# Patient Record
Sex: Female | Born: 1950
Health system: Southern US, Community
[De-identification: ages and names within clinical notes are randomized; demographics above are authoritative.]

## PROBLEM LIST (undated history)

## (undated) DIAGNOSIS — R112 Nausea with vomiting, unspecified: Secondary | ICD-10-CM

## (undated) DIAGNOSIS — K219 Gastro-esophageal reflux disease without esophagitis: Secondary | ICD-10-CM

## (undated) DIAGNOSIS — Z87898 Personal history of other specified conditions: Secondary | ICD-10-CM

## (undated) DIAGNOSIS — J309 Allergic rhinitis, unspecified: Secondary | ICD-10-CM

## (undated) DIAGNOSIS — M1990 Unspecified osteoarthritis, unspecified site: Secondary | ICD-10-CM

## (undated) DIAGNOSIS — I10 Essential (primary) hypertension: Secondary | ICD-10-CM

## (undated) DIAGNOSIS — Z87820 Personal history of traumatic brain injury: Secondary | ICD-10-CM

## (undated) DIAGNOSIS — Z86010 Personal history of colonic polyps: Secondary | ICD-10-CM

## (undated) DIAGNOSIS — C519 Malignant neoplasm of vulva, unspecified: Secondary | ICD-10-CM

## (undated) DIAGNOSIS — Z860101 Personal history of adenomatous and serrated colon polyps: Secondary | ICD-10-CM

## (undated) DIAGNOSIS — R51 Headache: Secondary | ICD-10-CM

## (undated) DIAGNOSIS — Z973 Presence of spectacles and contact lenses: Secondary | ICD-10-CM

## (undated) DIAGNOSIS — J45909 Unspecified asthma, uncomplicated: Secondary | ICD-10-CM

## (undated) DIAGNOSIS — Z9889 Other specified postprocedural states: Secondary | ICD-10-CM

## (undated) DIAGNOSIS — C4499 Other specified malignant neoplasm of skin, unspecified: Secondary | ICD-10-CM

## (undated) HISTORY — DX: Unspecified osteoarthritis, unspecified site: M19.90

## (undated) HISTORY — PX: TRIGGER FINGER RELEASE: SHX641

## (undated) HISTORY — DX: Essential (primary) hypertension: I10

## (undated) HISTORY — DX: Headache: R51

## (undated) HISTORY — DX: Malignant neoplasm of vulva, unspecified: C51.9

## (undated) HISTORY — PX: WISDOM TOOTH EXTRACTION: SHX21

## (undated) HISTORY — DX: Gastro-esophageal reflux disease without esophagitis: K21.9

## (undated) HISTORY — PX: OTHER SURGICAL HISTORY: SHX169

## (undated) HISTORY — DX: Other specified malignant neoplasm of skin, unspecified: C44.99

---

## 1975-06-08 HISTORY — PX: TONSILLECTOMY: SUR1361

## 1987-06-08 HISTORY — PX: VAGINAL HYSTERECTOMY: SUR661

## 2003-01-03 ENCOUNTER — Encounter: Payer: Self-pay | Admitting: Internal Medicine

## 2003-09-04 ENCOUNTER — Encounter: Payer: Self-pay | Admitting: Internal Medicine

## 2003-09-05 ENCOUNTER — Encounter: Payer: Self-pay | Admitting: Internal Medicine

## 2004-12-02 ENCOUNTER — Encounter: Payer: Self-pay | Admitting: Family Medicine

## 2005-04-22 ENCOUNTER — Ambulatory Visit: Payer: Self-pay | Admitting: Family Medicine

## 2006-05-17 ENCOUNTER — Ambulatory Visit: Payer: Self-pay | Admitting: Family Medicine

## 2006-05-17 IMAGING — MG UNKNOWN MG STUDY
1 series · 4 of 4 positions shown · non-contrast
Comparison: none

REASON FOR EXAM: scr mammo
COMMENTS:

PROCEDURE:     MAM - MAM DGTL SCREENING MAMMO W/CAD  - [DATE]  [DATE]
RESULT:     No dominant masses or pathologic clustered calcifications
demonstrated.  CAD evaluation is non-focal.

[R CC · right · 4 of 4 slices shown]
[im 1/4]
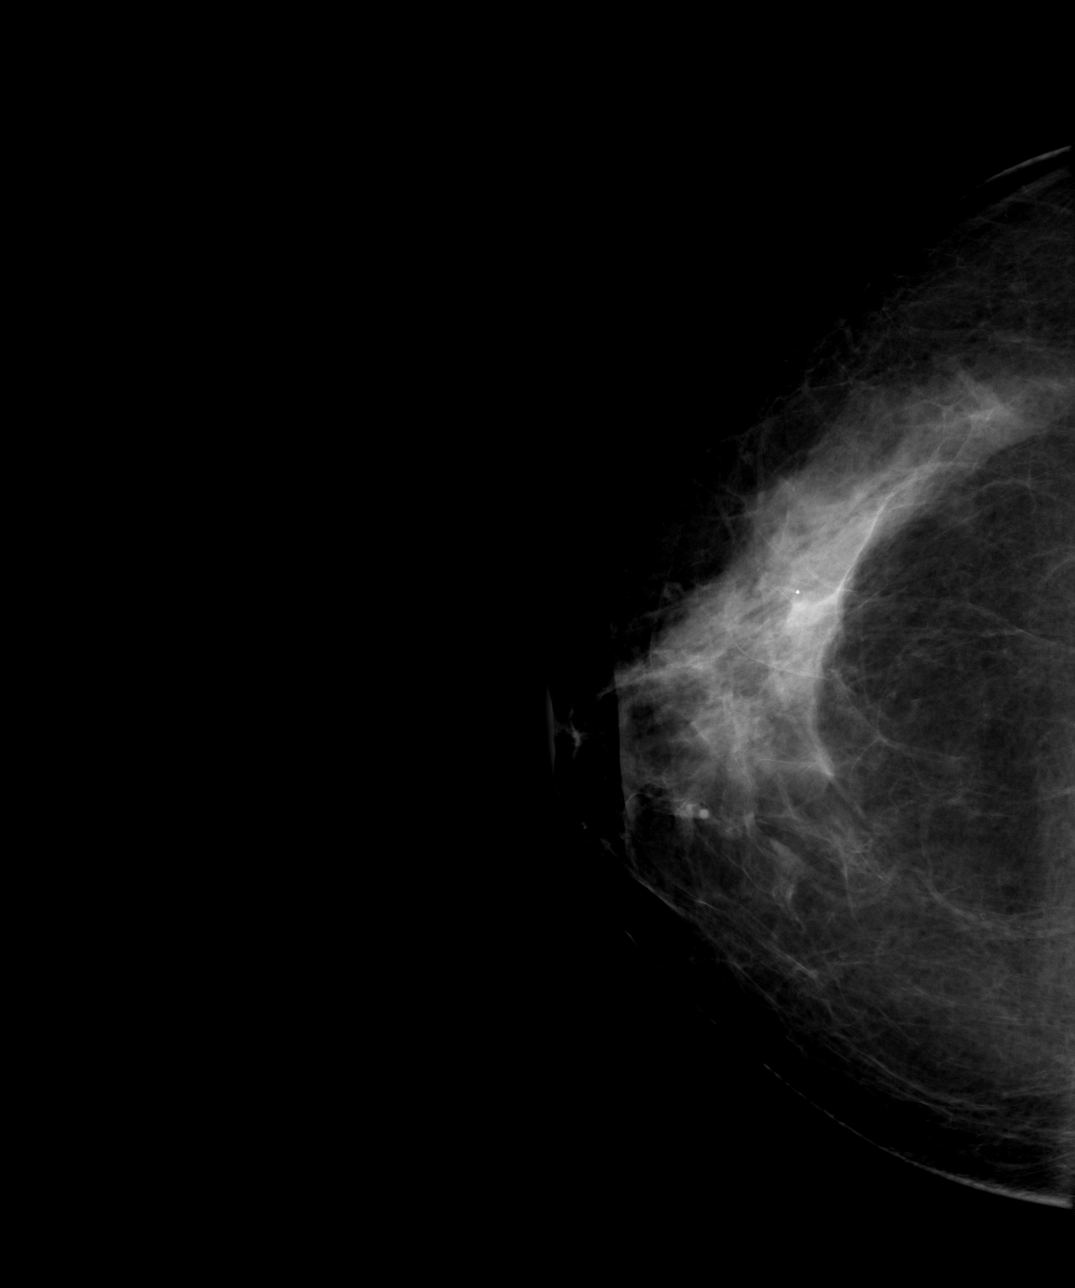
[im 2/4]
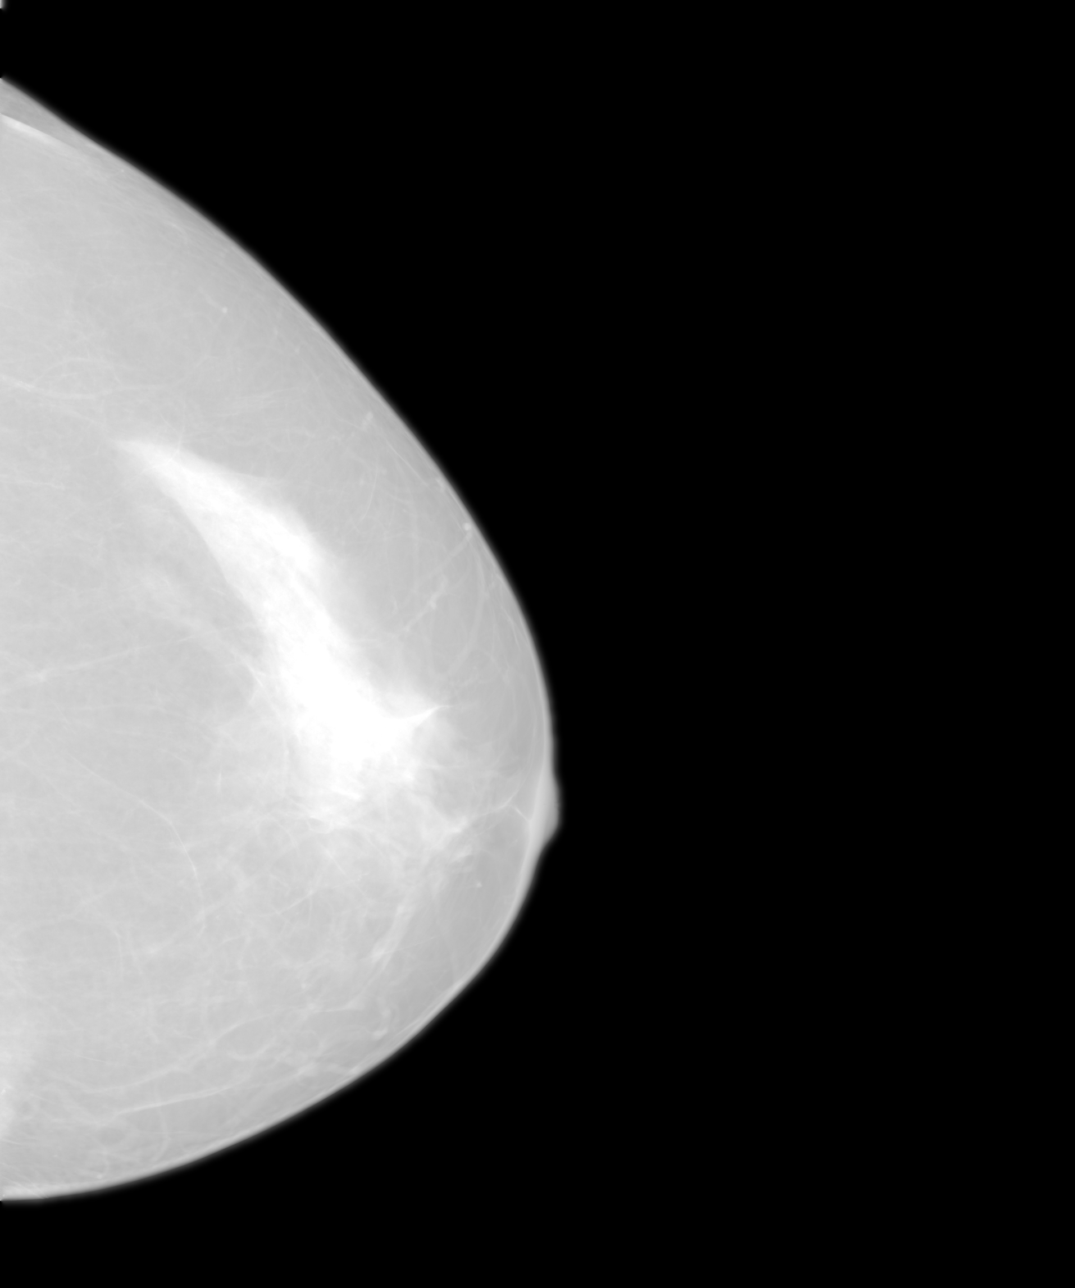
[im 3/4]
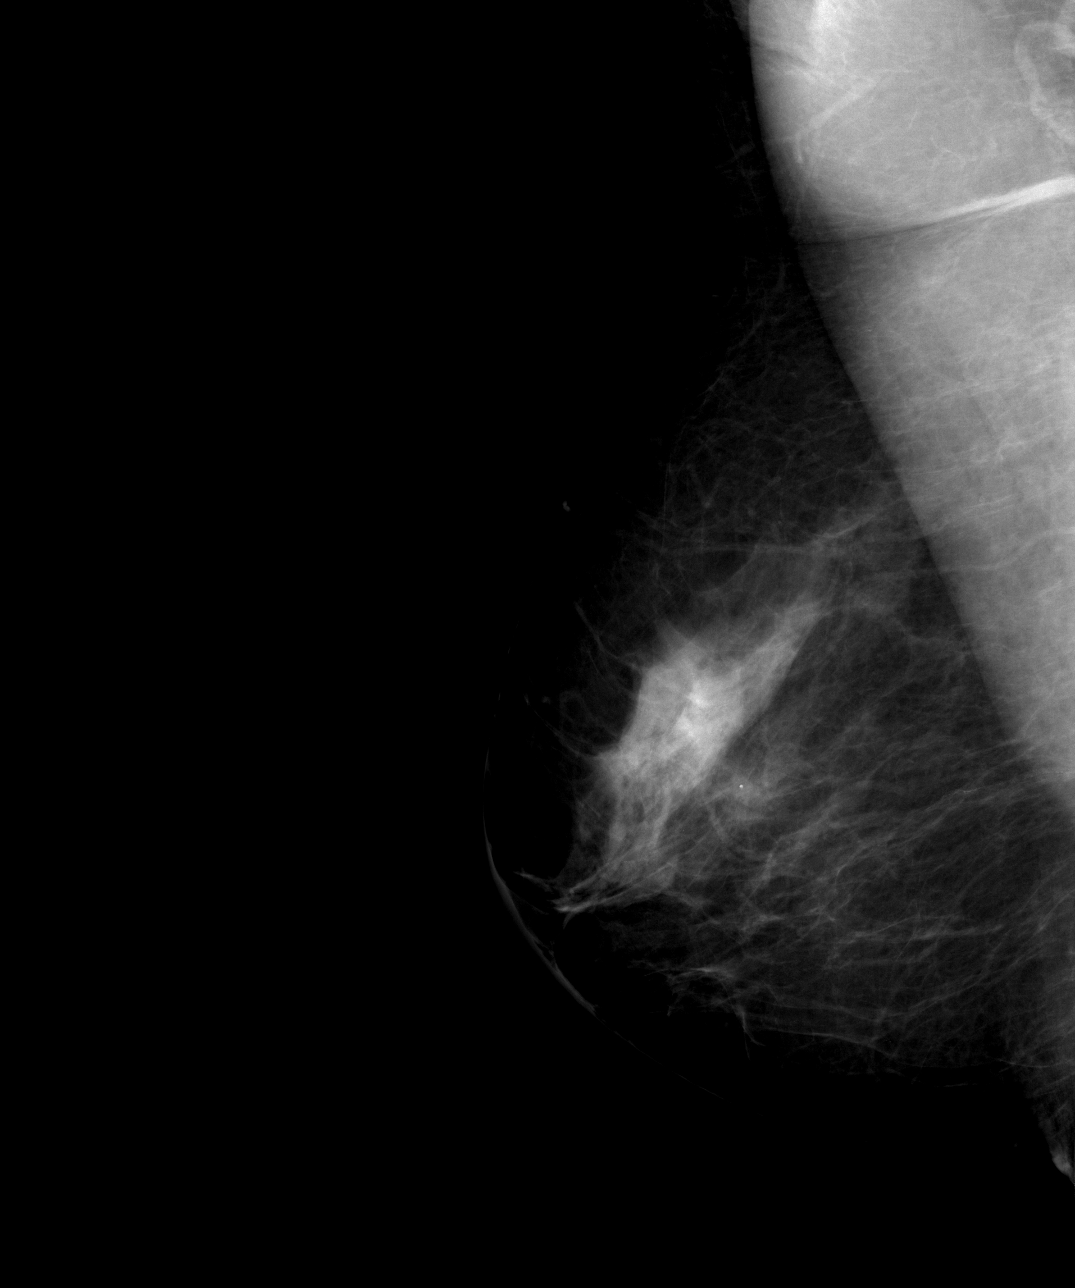
[im 4/4]
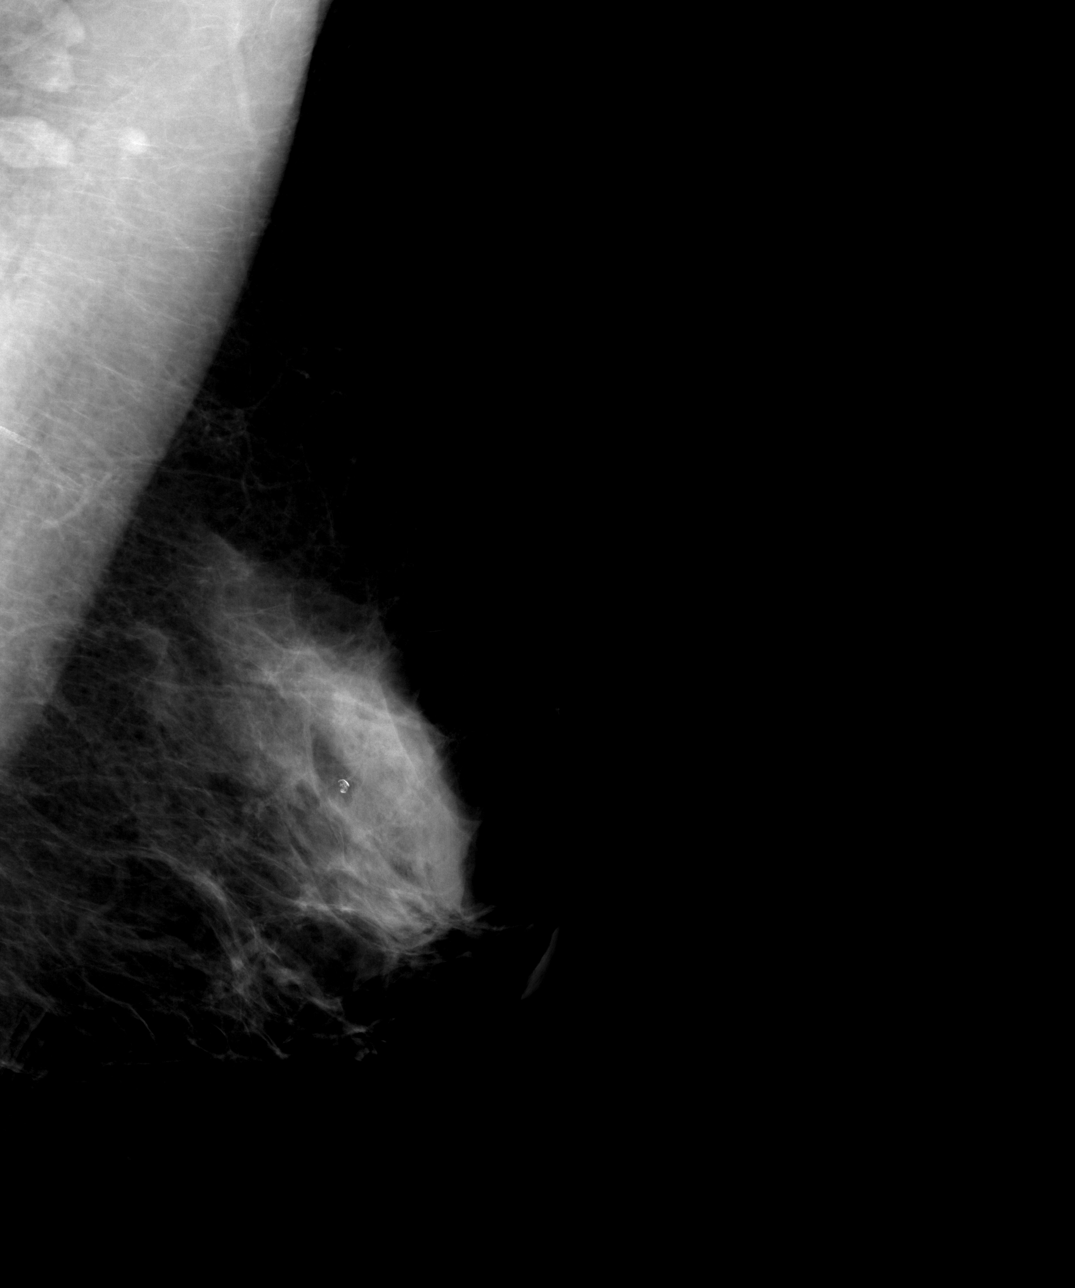

[4 of 4 positions shown; findings below may reference images not displayed]

IMPRESSION: 1)Stable, benign exam.  Yearly follow-up mammogram suggested.
BI-RADS: Category 2 - Benign Finding.

A NEGATIVE MAMMOGRAM REPORT DOES NOT PRECLUDE BIOPSY OR OTHER EVALUATION OF
A CLINICALLY PALPABLE OR OTHERWISE SUSPICIOUS MASS OR LESION. BREAST CANCER
MAY NOT BE DETECTED BY MAMMOGRAPHY IN UP TO 10% OF CASES.

## 2006-06-07 LAB — CONVERTED CEMR LAB

## 2006-08-29 ENCOUNTER — Encounter: Payer: Self-pay | Admitting: Internal Medicine

## 2006-10-10 ENCOUNTER — Ambulatory Visit: Payer: Self-pay | Admitting: Gastroenterology

## 2006-10-10 ENCOUNTER — Encounter: Payer: Self-pay | Admitting: Internal Medicine

## 2007-06-30 ENCOUNTER — Ambulatory Visit: Payer: Self-pay | Admitting: Family Medicine

## 2007-06-30 IMAGING — MG MAM DGTL SCREENING MAMMO W/CAD
1 series · 4 of 4 positions shown · non-contrast
Comparison: none

REASON FOR EXAM: Screening mammogram
COMMENTS:

[Series 6457: R CC · right · 4 of 4 slices shown]
[im 1/4]
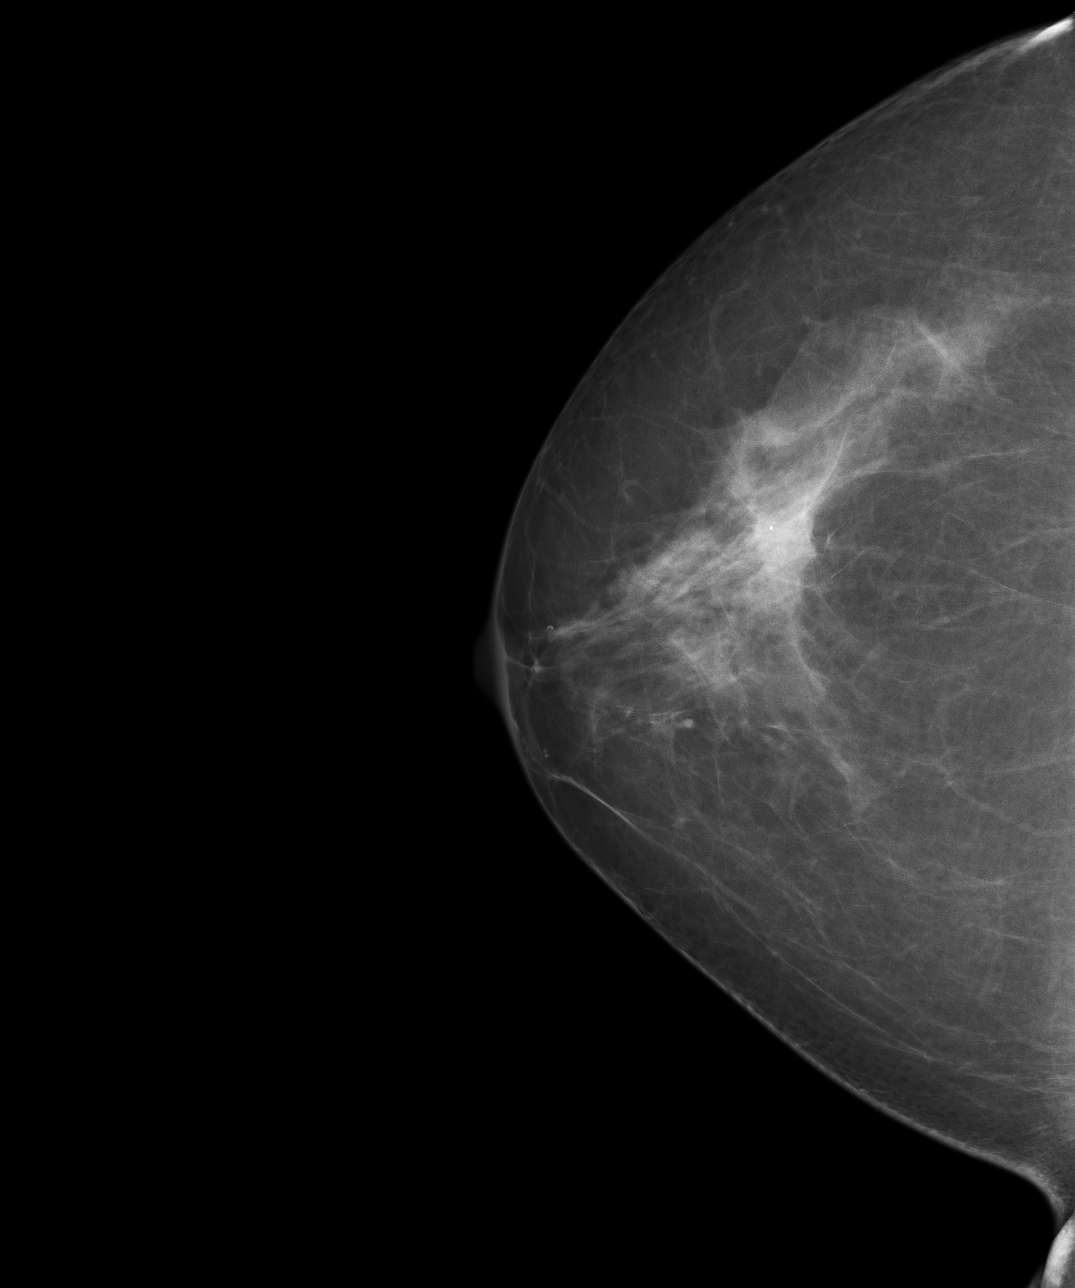
[im 2/4]
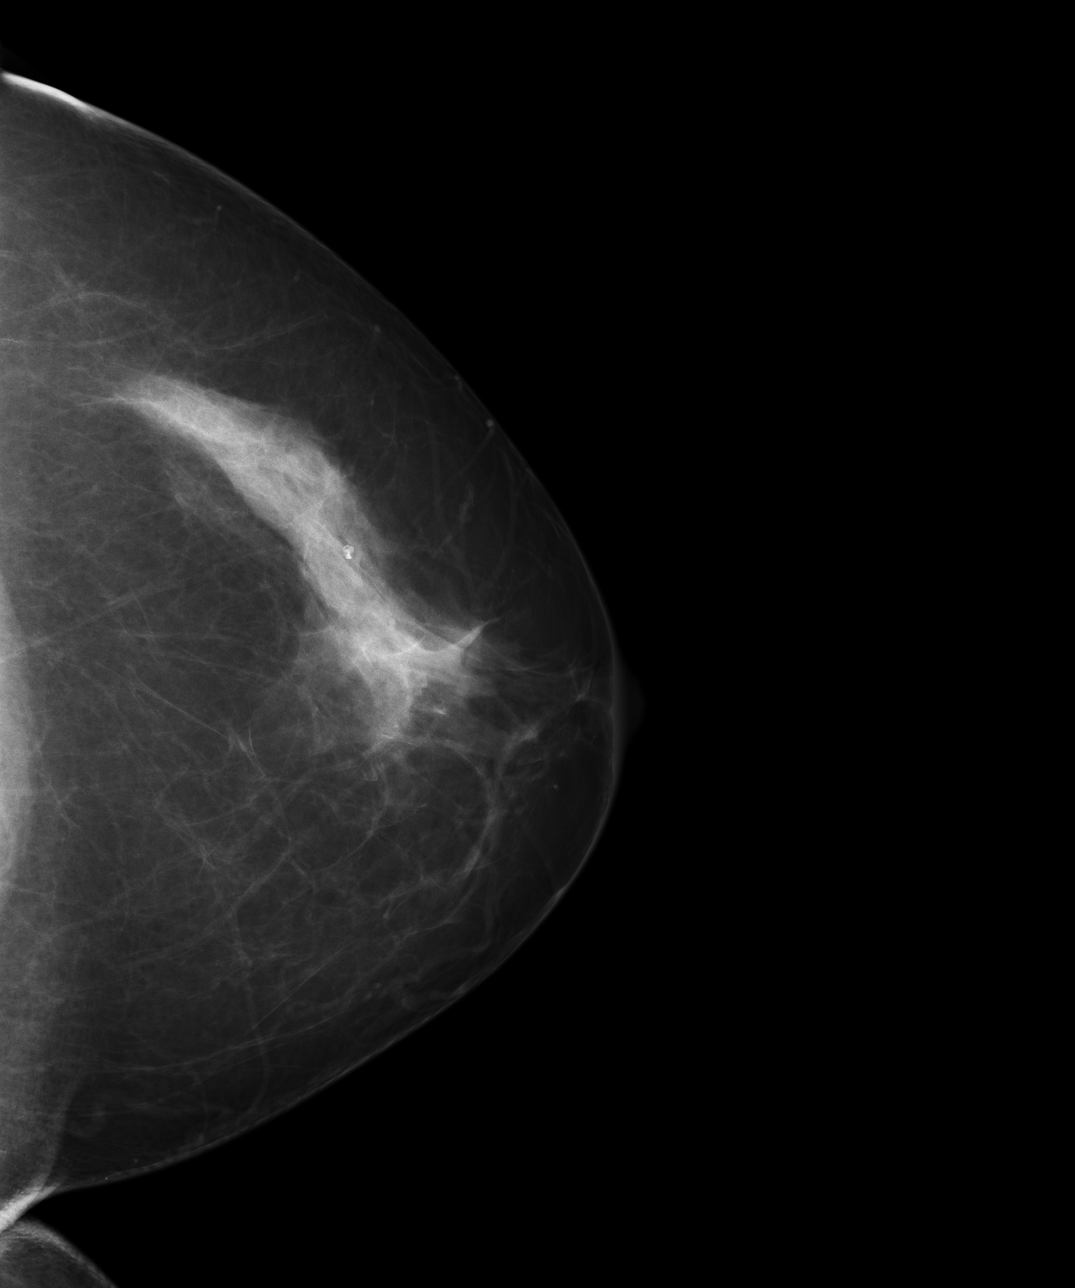
[im 3/4]
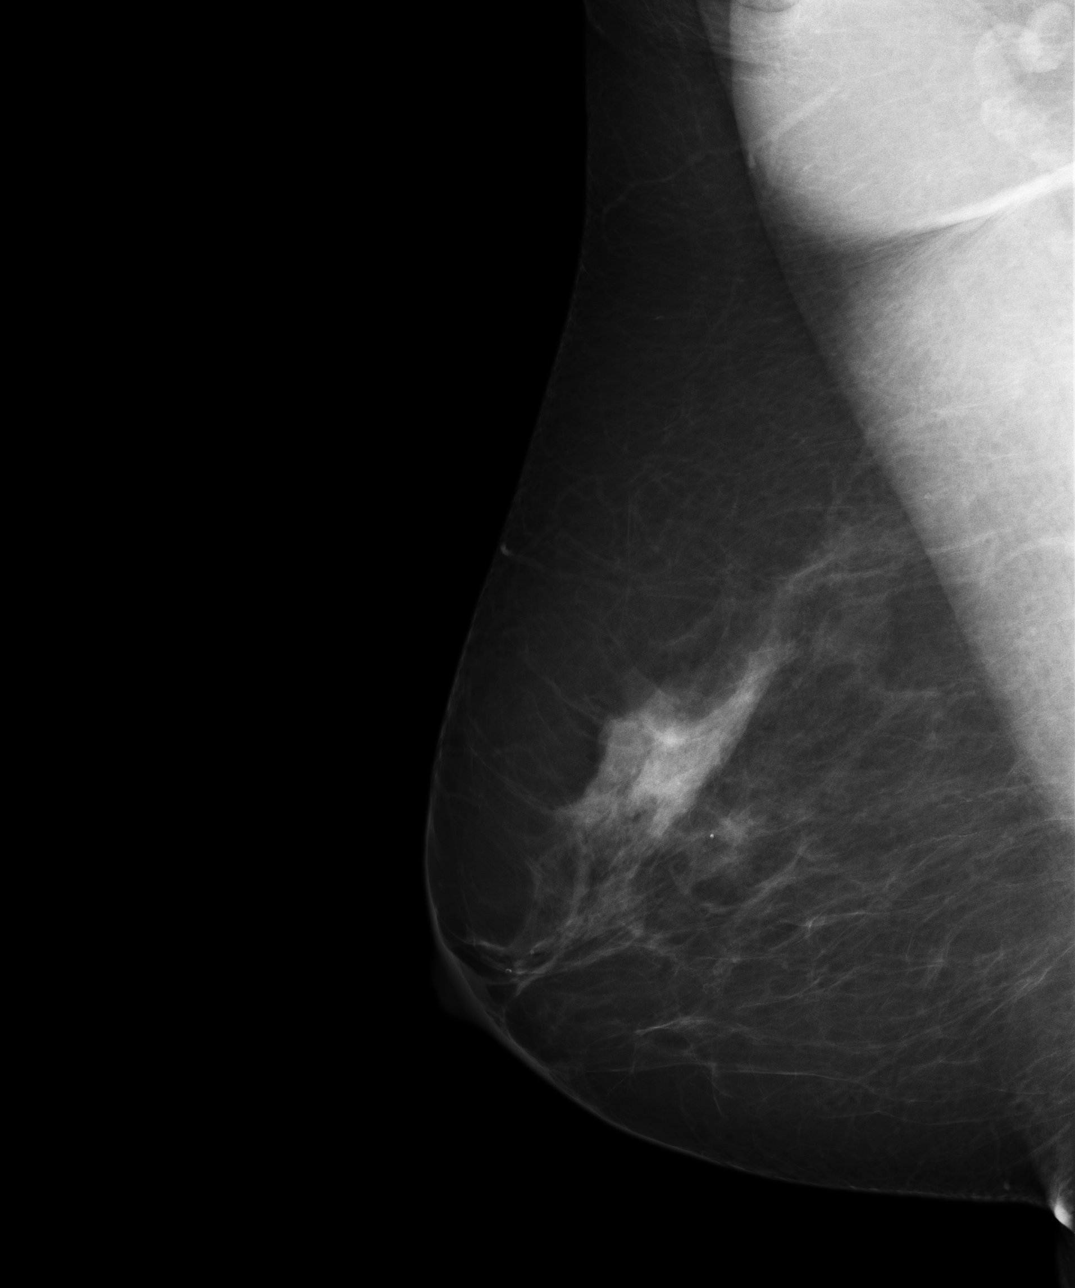
[im 4/4]
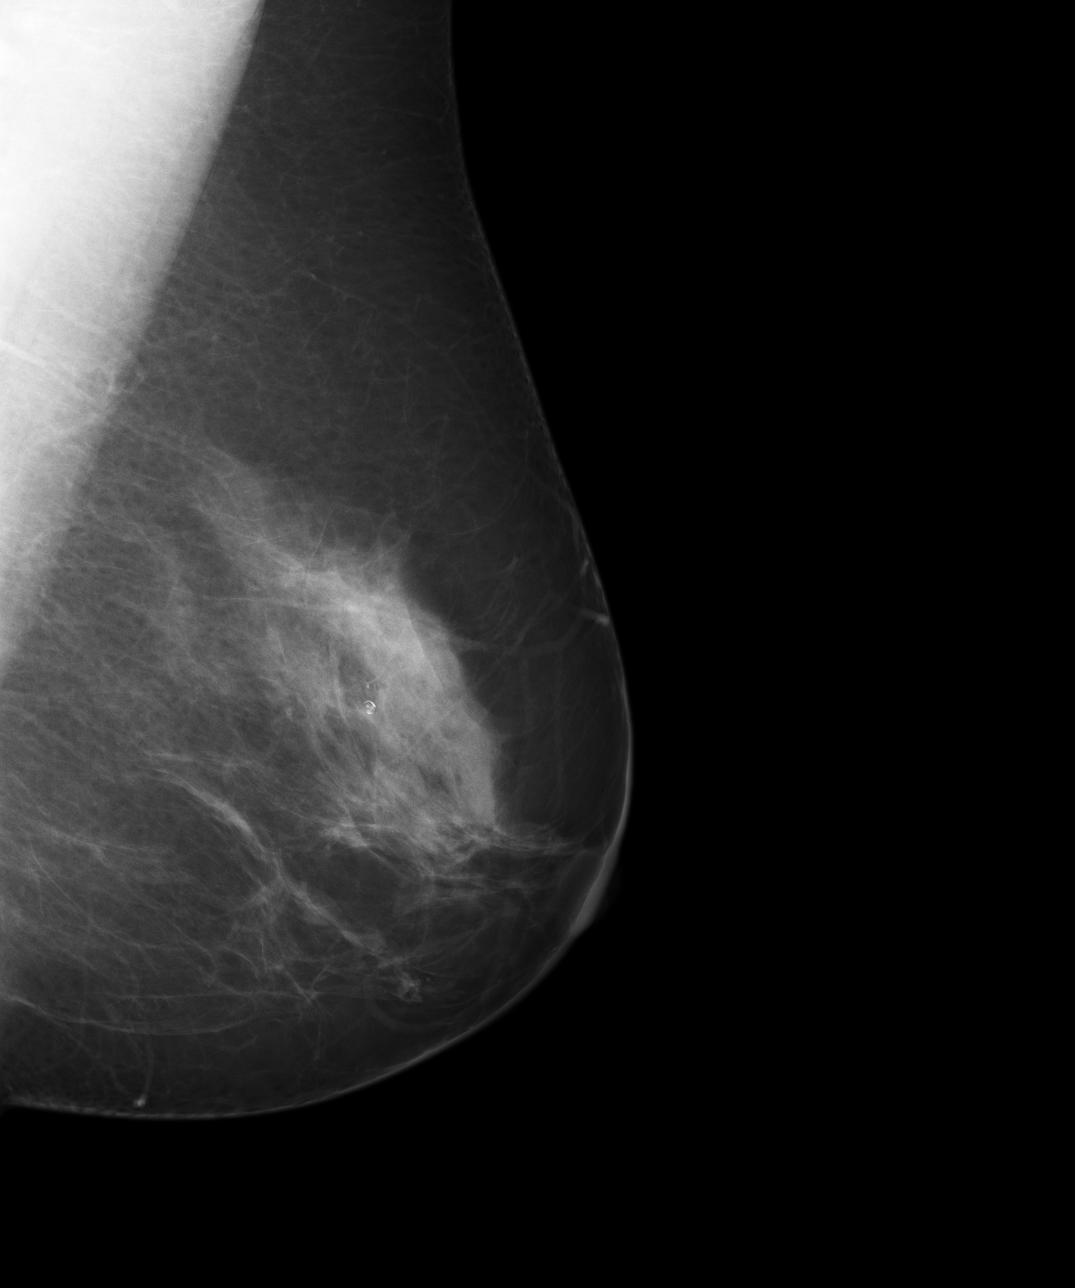

[4 of 4 positions shown; findings below may reference images not displayed]

PROCEDURE:     MAM - MAM DGTL SCREENING MAMMO W/CAD  - [DATE]  [DATE]

RESULT:     Comparison is made to a prior digital exam of [DATE] and to
a prior film screen examination of [DATE].

No mass or malignant appearing calcifications are seen. The breast
parenchyma is predominantly fatty and is nonhomogeneous.
IMPRESSION: 1.     Bilaterally benign appearing screening mammography.
2.     Annual screening mammography is recommended.
3.     BI-RADS: Category 2 - Benign Finding.

Thank you for this opportunity to contribute to the care of your patient.

A NEGATIVE MAMMOGRAM REPORT DOES NOT PRECLUDE BIOPSY OR OTHER EVALUATION OF
A CLINICALLY PALPABLE OR OTHERWISE SUSPICIOUS MASS OR LESION. BREAST CANCER
MAY NOT BE DETECTED BY MAMMOGRAPHY IN UP TO 10% OF CASES.

## 2008-10-10 ENCOUNTER — Ambulatory Visit: Payer: Self-pay | Admitting: Family Medicine

## 2008-10-10 DIAGNOSIS — N959 Unspecified menopausal and perimenopausal disorder: Secondary | ICD-10-CM | POA: Insufficient documentation

## 2008-10-10 DIAGNOSIS — J309 Allergic rhinitis, unspecified: Secondary | ICD-10-CM | POA: Insufficient documentation

## 2008-10-10 DIAGNOSIS — Z8679 Personal history of other diseases of the circulatory system: Secondary | ICD-10-CM | POA: Insufficient documentation

## 2008-10-10 DIAGNOSIS — I10 Essential (primary) hypertension: Secondary | ICD-10-CM | POA: Insufficient documentation

## 2008-10-10 DIAGNOSIS — M1990 Unspecified osteoarthritis, unspecified site: Secondary | ICD-10-CM | POA: Insufficient documentation

## 2008-10-10 DIAGNOSIS — M5030 Other cervical disc degeneration, unspecified cervical region: Secondary | ICD-10-CM | POA: Insufficient documentation

## 2008-10-10 DIAGNOSIS — G43009 Migraine without aura, not intractable, without status migrainosus: Secondary | ICD-10-CM | POA: Insufficient documentation

## 2008-10-10 DIAGNOSIS — K219 Gastro-esophageal reflux disease without esophagitis: Secondary | ICD-10-CM | POA: Insufficient documentation

## 2008-10-17 ENCOUNTER — Ambulatory Visit: Payer: Self-pay | Admitting: Family Medicine

## 2008-10-23 LAB — CONVERTED CEMR LAB
ALT: 16 units/L (ref 0–35)
AST: 24 units/L (ref 0–37)
Albumin: 4 g/dL (ref 3.5–5.2)
BUN: 12 mg/dL (ref 6–23)
Basophils Absolute: 0.1 10*3/uL (ref 0.0–0.1)
Chloride: 107 meq/L (ref 96–112)
Cholesterol: 200 mg/dL (ref 0–200)
Creatinine, Ser: 0.9 mg/dL (ref 0.4–1.2)
Glucose, Bld: 88 mg/dL (ref 70–99)
HCT: 34.4 % — ABNORMAL LOW (ref 36.0–46.0)
Lipase: 21 units/L (ref 11.0–59.0)
Lymphocytes Relative: 37.2 % (ref 12.0–46.0)
Lymphs Abs: 2 10*3/uL (ref 0.7–4.0)
Monocytes Relative: 11.1 % (ref 3.0–12.0)
Neutrophils Relative %: 47.2 % (ref 43.0–77.0)
Platelets: 226 10*3/uL (ref 150.0–400.0)
Potassium: 4.2 meq/L (ref 3.5–5.1)
RDW: 11.4 % — ABNORMAL LOW (ref 11.5–14.6)
TSH: 1.84 microintl units/mL (ref 0.35–5.50)
Total Protein: 7.2 g/dL (ref 6.0–8.3)
Triglycerides: 55 mg/dL (ref 0.0–149.0)
WBC: 5.4 10*3/uL (ref 4.5–10.5)

## 2008-10-25 ENCOUNTER — Telehealth: Payer: Self-pay | Admitting: Family Medicine

## 2008-11-11 ENCOUNTER — Ambulatory Visit: Payer: Self-pay | Admitting: Family Medicine

## 2008-11-11 DIAGNOSIS — D509 Iron deficiency anemia, unspecified: Secondary | ICD-10-CM | POA: Insufficient documentation

## 2008-11-22 ENCOUNTER — Telehealth: Payer: Self-pay | Admitting: Family Medicine

## 2008-11-25 ENCOUNTER — Ambulatory Visit: Payer: Self-pay | Admitting: Family Medicine

## 2008-11-28 ENCOUNTER — Telehealth: Payer: Self-pay | Admitting: Family Medicine

## 2008-11-28 ENCOUNTER — Telehealth (INDEPENDENT_AMBULATORY_CARE_PROVIDER_SITE_OTHER): Payer: Self-pay | Admitting: *Deleted

## 2008-12-02 LAB — CONVERTED CEMR LAB
Basophils Absolute: 0 10*3/uL (ref 0.0–0.1)
Eosinophils Relative: 5.3 % — ABNORMAL HIGH (ref 0.0–5.0)
Ferritin: 6.4 ng/mL — ABNORMAL LOW (ref 10.0–291.0)
Hemoglobin: 12.1 g/dL (ref 12.0–15.0)
Iron: 41 ug/dL — ABNORMAL LOW (ref 42–145)
Lymphocytes Relative: 32.9 % (ref 12.0–46.0)
Monocytes Relative: 9.3 % (ref 3.0–12.0)
Neutro Abs: 3.1 10*3/uL (ref 1.4–7.7)
RDW: 12.2 % (ref 11.5–14.6)
Saturation Ratios: 8.5 % — ABNORMAL LOW (ref 20.0–50.0)
WBC: 5.8 10*3/uL (ref 4.5–10.5)

## 2008-12-11 ENCOUNTER — Ambulatory Visit: Payer: Self-pay | Admitting: Internal Medicine

## 2008-12-11 DIAGNOSIS — Z860100 Personal history of colon polyps, unspecified: Secondary | ICD-10-CM | POA: Insufficient documentation

## 2008-12-13 ENCOUNTER — Ambulatory Visit: Payer: Self-pay | Admitting: Family Medicine

## 2008-12-13 LAB — CONVERTED CEMR LAB: OCCULT 3: NEGATIVE

## 2008-12-24 ENCOUNTER — Ambulatory Visit: Payer: Self-pay | Admitting: Internal Medicine

## 2008-12-24 ENCOUNTER — Encounter: Payer: Self-pay | Admitting: Internal Medicine

## 2008-12-26 ENCOUNTER — Encounter: Payer: Self-pay | Admitting: Internal Medicine

## 2008-12-30 ENCOUNTER — Encounter (INDEPENDENT_AMBULATORY_CARE_PROVIDER_SITE_OTHER): Payer: Self-pay | Admitting: *Deleted

## 2009-01-07 ENCOUNTER — Telehealth: Payer: Self-pay | Admitting: Internal Medicine

## 2009-01-22 ENCOUNTER — Telehealth: Payer: Self-pay | Admitting: Family Medicine

## 2009-03-28 ENCOUNTER — Ambulatory Visit: Payer: Self-pay | Admitting: Family Medicine

## 2009-05-20 ENCOUNTER — Telehealth: Payer: Self-pay | Admitting: Family Medicine

## 2009-05-23 ENCOUNTER — Ambulatory Visit: Payer: Self-pay | Admitting: Family Medicine

## 2009-05-26 ENCOUNTER — Telehealth: Payer: Self-pay | Admitting: Family Medicine

## 2009-05-27 ENCOUNTER — Ambulatory Visit: Payer: Self-pay | Admitting: Family Medicine

## 2009-06-02 ENCOUNTER — Telehealth (INDEPENDENT_AMBULATORY_CARE_PROVIDER_SITE_OTHER): Payer: Self-pay | Admitting: *Deleted

## 2009-06-10 ENCOUNTER — Ambulatory Visit: Payer: Self-pay | Admitting: Family Medicine

## 2009-06-10 ENCOUNTER — Telehealth: Payer: Self-pay | Admitting: Family Medicine

## 2009-06-12 ENCOUNTER — Ambulatory Visit: Payer: Self-pay | Admitting: Family Medicine

## 2009-07-08 ENCOUNTER — Ambulatory Visit: Payer: Self-pay | Admitting: Family Medicine

## 2009-07-14 ENCOUNTER — Ambulatory Visit: Payer: Self-pay | Admitting: Family Medicine

## 2009-07-15 ENCOUNTER — Ambulatory Visit: Payer: Self-pay | Admitting: Family Medicine

## 2009-07-15 ENCOUNTER — Telehealth: Payer: Self-pay | Admitting: Family Medicine

## 2009-07-16 LAB — CONVERTED CEMR LAB
ALT: 23 units/L (ref 0–35)
AST: 23 units/L (ref 0–37)
Basophils Absolute: 0.1 10*3/uL (ref 0.0–0.1)
Bilirubin, Direct: 0.1 mg/dL (ref 0.0–0.3)
Chloride: 107 meq/L (ref 96–112)
Direct LDL: 122.5 mg/dL
Eosinophils Relative: 2.4 % (ref 0.0–5.0)
HCT: 40.5 % (ref 36.0–46.0)
Lymphs Abs: 1.9 10*3/uL (ref 0.7–4.0)
MCV: 96.6 fL (ref 78.0–100.0)
Monocytes Absolute: 0.7 10*3/uL (ref 0.1–1.0)
Platelets: 235 10*3/uL (ref 150.0–400.0)
Potassium: 4.6 meq/L (ref 3.5–5.1)
RDW: 11.2 % — ABNORMAL LOW (ref 11.5–14.6)
Total Bilirubin: 0.5 mg/dL (ref 0.3–1.2)
Total CHOL/HDL Ratio: 2
VLDL: 15.8 mg/dL (ref 0.0–40.0)

## 2009-07-17 ENCOUNTER — Telehealth: Payer: Self-pay | Admitting: Family Medicine

## 2009-07-19 ENCOUNTER — Ambulatory Visit: Payer: Self-pay | Admitting: Family Medicine

## 2009-07-21 ENCOUNTER — Telehealth: Payer: Self-pay | Admitting: Family Medicine

## 2009-07-23 ENCOUNTER — Telehealth: Payer: Self-pay | Admitting: Family Medicine

## 2009-07-24 ENCOUNTER — Telehealth (INDEPENDENT_AMBULATORY_CARE_PROVIDER_SITE_OTHER): Payer: Self-pay | Admitting: *Deleted

## 2009-07-25 ENCOUNTER — Ambulatory Visit: Payer: Self-pay | Admitting: Family Medicine

## 2009-07-25 ENCOUNTER — Encounter (INDEPENDENT_AMBULATORY_CARE_PROVIDER_SITE_OTHER): Payer: Self-pay | Admitting: *Deleted

## 2009-07-28 ENCOUNTER — Telehealth: Payer: Self-pay | Admitting: Family Medicine

## 2009-07-30 ENCOUNTER — Ambulatory Visit: Payer: Self-pay | Admitting: Family Medicine

## 2009-08-01 ENCOUNTER — Telehealth: Payer: Self-pay | Admitting: Family Medicine

## 2009-08-04 ENCOUNTER — Telehealth: Payer: Self-pay | Admitting: Family Medicine

## 2009-08-12 ENCOUNTER — Encounter (INDEPENDENT_AMBULATORY_CARE_PROVIDER_SITE_OTHER): Payer: Self-pay | Admitting: *Deleted

## 2009-08-21 ENCOUNTER — Encounter: Payer: Self-pay | Admitting: Family Medicine

## 2009-08-27 ENCOUNTER — Ambulatory Visit: Payer: Self-pay | Admitting: Family Medicine

## 2009-08-29 DIAGNOSIS — R928 Other abnormal and inconclusive findings on diagnostic imaging of breast: Secondary | ICD-10-CM | POA: Insufficient documentation

## 2009-09-12 ENCOUNTER — Ambulatory Visit: Payer: Self-pay | Admitting: Family Medicine

## 2009-09-12 DIAGNOSIS — L03019 Cellulitis of unspecified finger: Secondary | ICD-10-CM | POA: Insufficient documentation

## 2009-09-12 DIAGNOSIS — R5383 Other fatigue: Secondary | ICD-10-CM

## 2009-09-12 DIAGNOSIS — R5381 Other malaise: Secondary | ICD-10-CM | POA: Insufficient documentation

## 2009-09-16 ENCOUNTER — Ambulatory Visit: Payer: Self-pay | Admitting: Family Medicine

## 2009-09-19 ENCOUNTER — Telehealth: Payer: Self-pay | Admitting: Family Medicine

## 2009-09-25 ENCOUNTER — Ambulatory Visit: Payer: Self-pay | Admitting: Family Medicine

## 2009-10-03 ENCOUNTER — Encounter: Payer: Self-pay | Admitting: Family Medicine

## 2009-10-08 ENCOUNTER — Telehealth: Payer: Self-pay | Admitting: Family Medicine

## 2009-10-16 ENCOUNTER — Telehealth: Payer: Self-pay | Admitting: Family Medicine

## 2009-10-29 ENCOUNTER — Ambulatory Visit: Payer: Self-pay | Admitting: Family Medicine

## 2009-11-04 ENCOUNTER — Telehealth: Payer: Self-pay | Admitting: Family Medicine

## 2009-11-07 ENCOUNTER — Telehealth: Payer: Self-pay | Admitting: Family Medicine

## 2009-11-10 ENCOUNTER — Ambulatory Visit: Payer: Self-pay | Admitting: Family Medicine

## 2010-02-12 ENCOUNTER — Encounter: Payer: Self-pay | Admitting: Family Medicine

## 2010-02-12 ENCOUNTER — Ambulatory Visit: Payer: Self-pay | Admitting: Obstetrics and Gynecology

## 2010-02-18 ENCOUNTER — Ambulatory Visit: Payer: Self-pay | Admitting: Obstetrics and Gynecology

## 2010-02-24 ENCOUNTER — Telehealth: Payer: Self-pay | Admitting: Family Medicine

## 2010-03-13 ENCOUNTER — Ambulatory Visit: Admission: RE | Admit: 2010-03-13 | Discharge: 2010-03-13 | Payer: Self-pay | Admitting: Gynecology

## 2010-03-24 ENCOUNTER — Ambulatory Visit (HOSPITAL_COMMUNITY): Admission: RE | Admit: 2010-03-24 | Discharge: 2010-03-24 | Payer: Self-pay | Admitting: Gynecology

## 2010-03-24 HISTORY — PX: SIMPLE VULVECTOMY: SUR1442

## 2010-03-25 ENCOUNTER — Telehealth (INDEPENDENT_AMBULATORY_CARE_PROVIDER_SITE_OTHER): Payer: Self-pay | Admitting: *Deleted

## 2010-03-25 ENCOUNTER — Telehealth: Payer: Self-pay | Admitting: Family Medicine

## 2010-04-24 ENCOUNTER — Encounter (INDEPENDENT_AMBULATORY_CARE_PROVIDER_SITE_OTHER): Payer: Self-pay | Admitting: *Deleted

## 2010-04-29 ENCOUNTER — Ambulatory Visit
Admission: RE | Admit: 2010-04-29 | Discharge: 2010-04-29 | Payer: Self-pay | Source: Home / Self Care | Admitting: Gynecology

## 2010-05-06 ENCOUNTER — Encounter (INDEPENDENT_AMBULATORY_CARE_PROVIDER_SITE_OTHER): Payer: Self-pay | Admitting: *Deleted

## 2010-05-07 ENCOUNTER — Ambulatory Visit: Payer: Self-pay | Admitting: Internal Medicine

## 2010-05-08 ENCOUNTER — Telehealth (INDEPENDENT_AMBULATORY_CARE_PROVIDER_SITE_OTHER): Payer: Self-pay | Admitting: *Deleted

## 2010-05-15 ENCOUNTER — Telehealth: Payer: Self-pay | Admitting: Internal Medicine

## 2010-05-18 ENCOUNTER — Ambulatory Visit: Payer: Self-pay | Admitting: Internal Medicine

## 2010-06-11 ENCOUNTER — Ambulatory Visit
Admission: RE | Admit: 2010-06-11 | Discharge: 2010-06-11 | Payer: Self-pay | Source: Home / Self Care | Attending: Family Medicine | Admitting: Family Medicine

## 2010-06-15 ENCOUNTER — Telehealth (INDEPENDENT_AMBULATORY_CARE_PROVIDER_SITE_OTHER): Payer: Self-pay | Admitting: *Deleted

## 2010-06-16 ENCOUNTER — Ambulatory Visit
Admission: RE | Admit: 2010-06-16 | Discharge: 2010-06-16 | Payer: Self-pay | Source: Home / Self Care | Attending: Family Medicine | Admitting: Family Medicine

## 2010-06-18 ENCOUNTER — Encounter: Payer: Self-pay | Admitting: Family Medicine

## 2010-06-19 ENCOUNTER — Ambulatory Visit
Admission: RE | Admit: 2010-06-19 | Discharge: 2010-06-19 | Payer: Self-pay | Source: Home / Self Care | Attending: Family Medicine | Admitting: Family Medicine

## 2010-06-23 ENCOUNTER — Telehealth: Payer: Self-pay | Admitting: Family Medicine

## 2010-06-23 ENCOUNTER — Ambulatory Visit
Admission: RE | Admit: 2010-06-23 | Discharge: 2010-06-23 | Payer: Self-pay | Source: Home / Self Care | Attending: Family Medicine | Admitting: Family Medicine

## 2010-06-24 ENCOUNTER — Encounter (INDEPENDENT_AMBULATORY_CARE_PROVIDER_SITE_OTHER): Payer: Self-pay | Admitting: *Deleted

## 2010-07-07 NOTE — Progress Notes (Signed)
Summary: Office Visit  Office Visit   Imported By: Francee Piccolo CMA (AAMA) 05/15/2010 09:08:05  _____________________________________________________________________  External Attachment:    Type:   Image     Comment:   External Document

## 2010-07-07 NOTE — Progress Notes (Signed)
Summary: regarding cough  Phone Note Call from Patient Call back at Home Phone [phone]   Caller: Patient Call For: Kerby Nora MD Summary of Call: Pt is asking if you would have ordeed a CXR on her for her cough, or would you have followed the course that Dr. Hetty Ely took of explaining to her that the cough could last awhile and she should give it more time?  Please advise. Initial call taken by: Lowella Petties CMA,  August 04, 2009 12:27 PM  Follow-up for Phone Call        patient currently on doxycycline, appropriate outpatient ABX coverage for PNA or bronchitis. I will leave for Dr. Daphine Deutscher opinion. Follow-up by: Hannah Beat MD,  August 04, 2009 5:21 PM  Additional Follow-up for Phone Call Additional follow up Details #1::        I reviewed SChaller's note...would agree with his care. Also agree with what Colpand say above.  She is actually on meds that would cover pneumonia anyway. Follow up if not improving as recommended.  Additional Follow-up by: Kerby Nora MD,  August 05, 2009 8:22 AM    Additional Follow-up for Phone Call Additional follow up Details #2::    Patient advised.Consuello Masse CMA  Follow-up by: Benny Lennert CMA Duncan Dull),  August 05, 2009 11:31 AM

## 2010-07-07 NOTE — Progress Notes (Signed)
Summary: Call report  Phone Note Other Incoming   Caller: [facility] Call Report Summary of Call: [facility] Triage Call Report Triage Record Num: [id number] Operator: Durward Mallard DiMatteis Patient Name: [name] Call Date & Time: 07/19/2009 9:21:29AM Patient Phone: [phone] PCP: Patient Gender: Female PCP Fax : Patient DOB: Mar 20, 1951 Practice Name: [facility] Caller states that she called last nigth andwas nistructed to go to ER for fever, wheezing, cough and congestion; she statesthat she did not go and that her fever has "broke" today but would still like to be seen today in the office; office called and appt made for today at 12:15pm Reason for Call: Protocol(s) Used: Office Note Recommended Outcome per Protocol: Information Noted and Sent to Office Reason for Outcome: Caller information to office Initial call taken by: Margaret Pyle, CMA,  July 23, 2009 7:59 AM

## 2010-07-07 NOTE — Assessment & Plan Note (Signed)
Summary: check infection in finger/alc   Vital Signs:  Patient profile:   60 year old female Height:      64 inches Weight:      174.38 pounds BMI:     30.04 Temp:     98.3 degrees F oral Pulse rate:   60 / minute Pulse rhythm:   regular BP sitting:   140 / 90  (left arm) Cuff size:   regular  Vitals Entered By: Linde Gillis CMA Duncan Dull) (September 25, 2009 12:15 PM) CC: check infection in finger   History of Present Illness: Pt here for follow up paronychia. Was seen on 4/8 by Dr. Ermalene Searing, placed on Keflex 500 mg two times a day. I saw her on 4/14 and performed I & D although there was not much pus expressed.  Has been a chronic issue for her on her third finger on right hand. Had to have part of nail removed in November, since then it has been chronicallhy swollen but past month swelling and redness have worsened.    Since I last saw her, still painful with pressure. No longer red, not pulsating. No fevers, chills, n/v.  Current Medications (verified): 1)  Imitrex 50 Mg Tabs (Sumatriptan Succinate) .... Take 1 To 2  Tablet By Mouth Once A Day As Needed Migraine, May Repeat X 2 Tab  If No Relief in 2 Hours. May 200 Mg in 24 Hours. 2)  Duke's Magic Mouthwash .... Swish and Swallow As Needed 3)  Metoprolol Tartrate 25 Mg Tabs (Metoprolol Tartrate) .Marland Kitchen.. 1 Tab By Mouth Two Times A Day 4)  Lansoprazole 30 Mg Cpdr (Lansoprazole) .Marland Kitchen.. 1 Tab  By Mouth Daily 5)  Calcium 600/vitamin D 600-400 Mg-Unit Chew (Calcium Carbonate-Vitamin D) .Marland Kitchen.. 1 Tab By Mouth Two Times A Day 6)  Ferrous Sulfate 325 (65 Fe) Mg Tabs (Ferrous Sulfate) .... Take 1 Tablet By Mouth Two Times A Day (Sometimes) 7)  Proair Hfa 108 (90 Base) Mcg/act Aers (Albuterol Sulfate) .... 2 Puffs Inhaled Every 6 Hours As Needed Wheeze 8)  Advair 9)  Cephalexin 500 Mg Caps (Cephalexin) .Marland Kitchen.. 1 Tab By Mouth Three Times A Day X 7 Days  Allergies: 1)  ! Sulfa  Review of Systems      See HPI General:  Denies chills and fever. GI:   Denies nausea and vomiting.  Physical Exam  General:  Well-developed,well-nourished,in no acute distress; alert,appropriate and cooperative throughout examination Extremities:  3rd diiti right finger distal, chronic paronychia,, no erythema, no discharge Psych:  Cognition and judgment appear intact. Alert and cooperative with normal attention span and concentration. No apparent delusions, illusions, hallucinations   Impression & Recommendations:  Problem # 1:  PARONYCHIA, FINGER (ICD-681.02) Assessment Unchanged  Less erythematous, otherwise about the same. I do not think excising down to cuticle bed will help at this point, no signs of infection. 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 Will refer to derm if she would like a larger excision. Her updated medication list for this problem includes:    Cephalexin 500 Mg Caps (Cephalexin) .Marland Kitchen... 1 tab by mouth three times a day x 7 days  Orders: Dermatology Referral (Derma)  Complete Medication List: 1)  Imitrex 50 Mg Tabs (Sumatriptan succinate) .... Take 1 to 2  tablet by mouth once a day as needed migraine, may repeat x 2 tab  if no relief in 2 hours. may 200 mg in 24 hours. 2)  Duke's Magic Mouthwash  .... Swish and swallow as  needed 3)  Metoprolol Tartrate 25 Mg Tabs (Metoprolol tartrate) .Marland Kitchen.. 1 tab by mouth two times a day 4)  Lansoprazole 30 Mg Cpdr (Lansoprazole) .Marland Kitchen.. 1 tab  by mouth daily 5)  Calcium 600/vitamin D 600-400 Mg-unit Chew (Calcium carbonate-vitamin d) .Marland Kitchen.. 1 tab by mouth two times a day 6)  Ferrous Sulfate 325 (65 Fe) Mg Tabs (Ferrous sulfate) .... Take 1 tablet by mouth two times a day (sometimes) 7)  Proair Hfa 108 (90 Base) Mcg/act Aers (Albuterol sulfate) .... 2 puffs inhaled every 6 hours as needed wheeze 8)  Advair  9)  Cephalexin 500 Mg Caps (Cephalexin) .Marland Kitchen.. 1 tab by mouth three times a day x 7 days  Current Allergies (reviewed today): ! SULFA

## 2010-07-07 NOTE — Progress Notes (Signed)
Summary: still wants colonoscopy  Phone Note Call from Patient Call back at [phone]   Caller: Patient Call For: Kerby Nora MD Summary of Call: Pt says she called [facility] and they have changed their guidelines on colonoscopies.  She says she is due for one and wants to go to [facility]. Initial call taken by: Lowella Petties CMA,  March 25, 2010 2:23 PM  Follow-up for Phone Call         See last phone note. I was mistaken...Dr. Darliss Cheney did last colonoscopy but  Dr. Leone Payor from [facility] signed of on it in EMR.  Marion..can you call [facility] and have them review her colonoscopy to tell us when it is due given the fact that she is telling me something different then what is in the chart.  I don' t feel comfortable referring her directly for colonoscopy if last record say not due until 2013.Marland KitchenSINCE she has been seen at [facility] before, she can certainly bypass me and call them directly.  Follow-up by: Kerby Nora MD,  March 26, 2010 8:14 AM  Additional Follow-up for Phone Call Additional follow up Details #1::        Patient is going to contact [facility] her self Additional Follow-up by: Benny Lennert CMA Duncan Dull),  March 26, 2010 12:27 PM

## 2010-07-07 NOTE — Letter (Signed)
Summary: Maria Slim Bunch,DO,Dermatology,Note   Imported By: Beau Fanny 10/14/2009 11:52:02  _____________________________________________________________________  External Attachment:    Type:   Image     Comment:   External Document

## 2010-07-07 NOTE — Letter (Signed)
Summary: [facility] Instructions  [facility]  [address] [place], [place] [address]   Phone: [phone]  Fax: [phone]       [name]    [birth year]    MRN: [id number]        Procedure Day /Date:  Monday 05/18/2010     Arrival Time: 1:30 pm      Procedure Time: 2:30 pm     Location of Procedure:                    _x _  [facility] (4th Floor)                        PREPARATION FOR COLONOSCOPY WITH MOVIPREP   Starting 5 days prior to your procedure Wednesday 12/7 do not eat nuts, seeds, popcorn, corn, beans, peas,  salads, or any raw vegetables.  Do not take any fiber supplements (e.g. Metamucil, Citrucel, and Benefiber).  THE DAY BEFORE YOUR PROCEDURE         DATE: Sunday 12/11  1.  Drink clear liquids the entire day-NO SOLID FOOD  2.  Do not drink anything colored red or purple.  Avoid juices with pulp.  No orange juice.  3.  Drink at least 64 oz. (8 glasses) of fluid/clear liquids during the day to prevent dehydration and help the prep work efficiently.  CLEAR LIQUIDS INCLUDE: Water Jello Ice Popsicles Tea (sugar ok, no milk/cream) Powdered fruit flavored drinks Coffee (sugar ok, no milk/cream) Gatorade Juice: apple, white grape, white cranberry  Lemonade Clear bullion, consomm, broth Carbonated beverages (any kind) Strained chicken noodle soup Hard Candy                             4.  In the morning, mix first dose of MoviPrep solution:    Empty 1 Pouch A and 1 Pouch B into the disposable container    Add lukewarm drinking water to the top line of the container. Mix to dissolve    Refrigerate (mixed solution should be used within 24 hrs)  5.  Begin drinking the prep at 5:00 p.m. The MoviPrep container is divided by 4 marks.   Every 15 minutes drink the solution down to the next mark (approximately 8 oz) until the full liter is complete.   6.  Follow completed prep with 16 oz of clear liquid of your choice (Nothing  red or purple).  Continue to drink clear liquids until bedtime.  7.  Before going to bed, mix second dose of MoviPrep solution:    Empty 1 Pouch A and 1 Pouch B into the disposable container    Add lukewarm drinking water to the top line of the container. Mix to dissolve    Refrigerate  THE DAY OF YOUR PROCEDURE      DATE: Monday 12/12  Beginning at 9:30 a.m. (5 hours before procedure):         1. Every 15 minutes, drink the solution down to the next mark (approx 8 oz) until the full liter is complete.  2. Follow completed prep with 16 oz. of clear liquid of your choice.    3. You may drink clear liquids until 12:30 pm (2 HOURS BEFORE PROCEDURE).   MEDICATION INSTRUCTIONS  Unless otherwise instructed, you should take regular prescription medications with a small sip of water   as early as possible the morning of  your procedure.        OTHER INSTRUCTIONS  You will need a responsible adult at least 58 years of age to accompany you and drive you home.   This person must remain in the waiting room during your procedure.  Wear loose fitting clothing that is easily removed.  Leave jewelry and other valuables at home.  However, you may wish to bring a book to read or  an iPod/MP3 player to listen to music as you wait for your procedure to start.  Remove all body piercing jewelry and leave at home.  Total time from sign-in until discharge is approximately 2-3 hours.  You should go home directly after your procedure and rest.  You can resume normal activities the  day after your procedure.  The day of your procedure you should not:   Drive   Make legal decisions   Operate machinery   Drink alcohol   Return to work  You will receive specific instructions about eating, activities and medications before you leave.    The above instructions have been reviewed and explained to me by   Ezra Sites RN  May 07, 2010 11:29 AM     I fully understand and can  verbalize these instructions _____________________________ Date _________

## 2010-07-07 NOTE — Assessment & Plan Note (Signed)
Summary: CPX, HTN CHECK/BIR   Vital Signs:  Patient profile:   60 year old female Height:      64 inches Weight:      176.13 pounds BMI:     30.34 Temp:     98.1 degrees F oral Pulse rate:   88 / minute Pulse rhythm:   regular BP sitting:   142 / 82  (left arm) Cuff size:   regular  Vitals Entered By: Delilah Shan CMA Duncan Dull) (July 08, 2009 3:14 PM) CC: CPX (? pap).  HTN check, Hypertension Management   History of Present Illness:  HTN, has been on BBlocker..feels she has gained 30 lbs on this med in last 2 -3 years. This med was used for palpitation treatment as well.   Improved cough and reflux on lansoprazole. Singing better, less throat congestion.   Hypertension History:       No tchecking regularyly at home.        Positive major cardiovascular risk factors include female age 67 years old or older and hypertension.  Negative major cardiovascular risk factors include non-tobacco-user status.     Problems Prior to Update: 1)  Bronchitis- Acute  (ICD-466.0) 2)  Ingrown Nail  (ICD-703.0) 3)  Paronychia, Finger  (ICD-681.02) 4)  Uri  (ICD-465.9) 5)  Chest Pain  (ICD-786.50) 6)  Iron Deficiency  (ICD-280.9) 7)  Cough, Chronic  (ICD-786.2) 8)  Screening For Lipoid Disorders  (ICD-V77.91) 9)  Perimenopausal Syndrome  (ICD-627.9) 10)  Palpitations, Hx of  (ICD-V12.50) 11)  Abdominal Pain, Upper  (ICD-789.09) 12)  Colonic Polyps, Adenomatous, Hx of  (ICD-V12.72) 13)  Hypertension  (ICD-401.9) 14)  Gerd  (ICD-530.81) 15)  Allergic Rhinitis  (ICD-477.9) 16)  Degenerative Disc Disease, Cervical Spine  (ICD-722.4) 17)  Migraine, Common  (ICD-346.10) 18)  Osteoarthritis  (ICD-715.90)  Current Medications (verified): 1)  Imitrex 50 Mg Tabs (Sumatriptan Succinate) .... Take 1 To 2  Tablet By Mouth Once A Day As Needed Migraine, May Repeat X 2 Tab  If No Relief in 2 Hours. May 200 Mg in 24 Hours. 2)  Duke's Magic Mouthwash .... Swish and Swallow As Needed 3)  Toprol Xl  25 Mg Xr24h-Tab (Metoprolol Succinate) .... Take 1 Tablet By Mouth Two Times A Day 4)  Lansoprazole 30 Mg Cpdr (Lansoprazole) .Marland Kitchen.. 1 Tab  By Mouth Daily 5)  Calcium 600/vitamin D 600-400 Mg-Unit Chew (Calcium Carbonate-Vitamin D) .Marland Kitchen.. 1 Tab By Mouth Two Times A Day 6)  Ferrous Sulfate 325 (65 Fe) Mg Tabs (Ferrous Sulfate) .... Take 1 Tablet By Mouth Two Times A Day (Sometimes)  Allergies: 1)  ! Sulfa  Past History:  Past medical, surgical, family and social histories (including risk factors) reviewed, and no changes noted (except as noted below).  Past Medical History: Reviewed history from 01/05/2009 and no changes required. Allergic rhinitis GERD Hypertension Chronic Headaches Colon Polyps 2005 (adenoma), none 2008  Past Surgical History: Reviewed history from 10/10/2008 and no changes required. Tonsillectomy Partial hysterectomy for prolapse, no cancer, both ovaries remain  Family History: Reviewed history from 12/11/2008 and no changes required. father: CAD MI age 36, HTN, high chol mother: CAD, CABG age 24, HTN, high chol sister: aneurysm in brain age 36 brother: congenital heart defect..? ASD/VSD brothrer: HTN PGM: breast cancer Family History of Colon Cancer:Paternal uncle and grandmother   Social History: Reviewed history from 10/10/2008 and no changes required. Occupation: Tourist information centre manager Married 3 children: healthy Never Smoked Alcohol use-yes, wine 3 times a week Drug use-no  Regular exercise-no Diet: fruits , veggies, fiber, water  Review of Systems       vaginal dryness, itching on e lip of vaginal area  General:  Denies fatigue and fever. CV:  Denies chest pain or discomfort; some increase in peripheral edema and fluid status. Resp:  Denies shortness of breath. GI:  Denies abdominal pain, bloody stools, and constipation. GU:  Denies abnormal vaginal bleeding and dysuria. Derm:  Denies lesion(s). Psych:  Denies anxiety and depression.  Physical  Exam  General:  overweight female inNAD Ears:  External ear exam shows no significant lesions or deformities.  Otoscopic examination reveals clear canals, tympanic membranes are intact bilaterally without bulging, retraction, inflammation or discharge. Hearing is grossly normal bilaterally. Nose:  External nasal examination shows no deformity or inflammation. Nasal mucosa are pink and moist without lesions or exudates. Mouth:  MMM Neck:  no carotid bruit or thyromegaly no cervical or supraclavicular lymphadenopathy  Chest Wall:  No deformities, masses, or tenderness noted. Breasts:  No mass, nodules, thickening, tenderness, bulging, retraction, inflamation, nipple discharge or skin changes noted.   Lungs:  Normal respiratory effort, chest expands symmetrically. Lungs are clear to auscultation, no crackles or wheezes. Heart:  Normal rate and regular rhythm. S1 and S2 normal without gallop, murmur, click, rub or other extra sounds. Abdomen:  Bowel sounds positive,abdomen soft and non-tender without masses, organomegaly or hernias noted. Genitalia:  Pelvic Exam:        External: normal female genitalia without lesions or masses.Marland Kitchentwo hemangoioma right labia majora..where pt says irritated        Vagina: normal without lesions or masses        Cervix: normal without lesions or masses        Adnexa: normal bimanual exam without masses or fullness        Uterus: normal by palpation        Pap smear: not performed Pulses:  R and L posterior tibial pulses are full and equal bilaterally  Extremities:  no edema  Skin:  Intact without suspicious lesions or rashes Psych:  Cognition and judgment appear intact. Alert and cooperative with normal attention span and concentration. No apparent delusions, illusions, hallucinations   Impression & Recommendations:  Problem # 1:  Preventive Health Care (ICD-V70.0) Reviewed preventive care protocols, scheduled due services, and updated immunizations. Encouraged  exercise, weight loss, healthy eating habits.   Problem # 2:  Gynecological examination-routine (ICD-V72.31) PAp pending.   Problem # 3:  HYPERTENSION (ICD-401.9) Continue current medicaiton..may be contributing to fatigue. Follow Bps at home call if >140/90. .Work on low salt low fat diet and exercise. Follow up in 1-2 months.  Her updated medication list for this problem includes:    Toprol Xl 25 Mg Xr24h-tab (Metoprolol succinate) .Marland Kitchen... Take 1 tablet by mouth two times a day  Problem # 4:  GERD (ICD-530.81)  Chronic cough improved with treatment of GERD, continue.   Her updated medication list for this problem includes:    Lansoprazole 30 Mg Cpdr (Lansoprazole) .Marland Kitchen... 1 tab  by mouth daily  Complete Medication List: 1)  Imitrex 50 Mg Tabs (Sumatriptan succinate) .... Take 1 to 2  tablet by mouth once a day as needed migraine, may repeat x 2 tab  if no relief in 2 hours. may 200 mg in 24 hours. 2)  Duke's Magic Mouthwash  .... Swish and swallow as needed 3)  Toprol Xl 25 Mg Xr24h-tab (Metoprolol succinate) .... Take 1 tablet by mouth two times a  day 4)  Lansoprazole 30 Mg Cpdr (Lansoprazole) .Marland Kitchen.. 1 tab  by mouth daily 5)  Calcium 600/vitamin D 600-400 Mg-unit Chew (Calcium carbonate-vitamin d) .Marland Kitchen.. 1 tab by mouth two times a day 6)  Ferrous Sulfate 325 (65 Fe) Mg Tabs (Ferrous sulfate) .... Take 1 tablet by mouth two times a day (sometimes)  Other Orders: Radiology Referral (Radiology)  Hypertension Assessment/Plan:      The patient's hypertensive risk group is category B: At least one risk factor (excluding diabetes) with no target organ damage.  Her calculated 10 year risk of coronary heart disease is 8 %.  Today's blood pressure is 142/82.  Her blood pressure goal is < 140/90.  Patient Instructions: 1)  Follow BP at home , call if greater than 140/90 regularly.  Reocord vlues 2)  Schedule CMET, fasting Lipids in  10/2009. 3)  Referral Appointment Information 4)  Day/Date: 5)   Time: 6)  Place/MD: 7)  Address: 8)  Phone/Fax: 9)  Patient given appointment information. Information/Orders faxed/mailed.  10)  Please schedule a follow-up appointment in 1-2  months HTN check   Current Allergies (reviewed today): ! SULFA Flu Vaccine Next Due:  Refused      Past Medical History:    Reviewed history from 01/05/2009 and no changes required:       Allergic rhinitis       GERD       Hypertension       Chronic Headaches       Colon Polyps 2005 (adenoma), none 2008  Past Surgical History:    Reviewed history from 10/10/2008 and no changes required:       Tonsillectomy       Partial hysterectomy for prolapse, no cancer, both ovaries remain

## 2010-07-07 NOTE — Assessment & Plan Note (Signed)
Summary: 2 m f/u htn check/dlo   Vital Signs:  Patient profile:   60 year old female Height:      64 inches Weight:      174.4 pounds BMI:     30.04 Temp:     98.1 degrees F oral Pulse rate:   76 / minute Pulse rhythm:   regular BP sitting:   120 / 70  (left arm) Cuff size:   regular  Vitals Entered By: Benny Lennert CMA Duncan Dull) (September 12, 2009 4:06 PM)  History of Present Illness: Chief complaint 2 month follow up hypertension   3rd finger nail where ingrown nail removed..now with yellow pus drainage, pain in lateral finger. NAil growing in well.  no fever.   Hypertension History:      She denies headache, chest pain, palpitations, dyspnea with exertion, peripheral edema, visual symptoms, syncope, and side effects from treatment.  Not checking .        Positive major cardiovascular risk factors include female age 27 years old or older and hypertension.  Negative major cardiovascular risk factors include non-tobacco-user status.     Problems Prior to Update: 1)  Mammogram, Abnormal, Left  (ICD-793.80) 2)  Rash and Other Nonspecific Skin Eruption  (ICD-782.1) 3)  Routine Gynecological Examination  (ICD-V72.31) 4)  Preventive Health Care  (ICD-V70.0) 5)  Other Screening Mammogram  (ICD-V76.12) 6)  Ingrown Nail  (ICD-703.0) 7)  Paronychia, Finger  (ICD-681.02) 8)  Uri  (ICD-465.9) 9)  Chest Pain  (ICD-786.50) 10)  Iron Deficiency  (ICD-280.9) 11)  Cough, Chronic  (ICD-786.2) 12)  Screening For Lipoid Disorders  (ICD-V77.91) 13)  Perimenopausal Syndrome  (ICD-627.9) 14)  Palpitations, Hx of  (ICD-V12.50) 15)  Abdominal Pain, Upper  (ICD-789.09) 16)  Colonic Polyps, Adenomatous, Hx of  (ICD-V12.72) 17)  Hypertension  (ICD-401.9) 18)  Gerd  (ICD-530.81) 19)  Allergic Rhinitis  (ICD-477.9) 20)  Degenerative Disc Disease, Cervical Spine  (ICD-722.4) 21)  Migraine, Common  (ICD-346.10) 22)  Osteoarthritis  (ICD-715.90)  Current Medications (verified): 1)  Imitrex 50 Mg  Tabs (Sumatriptan Succinate) .... Take 1 To 2  Tablet By Mouth Once A Day As Needed Migraine, May Repeat X 2 Tab  If No Relief in 2 Hours. May 200 Mg in 24 Hours. 2)  Duke's Magic Mouthwash .... Swish and Swallow As Needed 3)  Metoprolol Tartrate 25 Mg Tabs (Metoprolol Tartrate) .Marland Kitchen.. 1 Tab By Mouth Two Times A Day 4)  Lansoprazole 30 Mg Cpdr (Lansoprazole) .Marland Kitchen.. 1 Tab  By Mouth Daily 5)  Calcium 600/vitamin D 600-400 Mg-Unit Chew (Calcium Carbonate-Vitamin D) .Marland Kitchen.. 1 Tab By Mouth Two Times A Day 6)  Ferrous Sulfate 325 (65 Fe) Mg Tabs (Ferrous Sulfate) .... Take 1 Tablet By Mouth Two Times A Day (Sometimes) 7)  Proair Hfa 108 (90 Base) Mcg/act Aers (Albuterol Sulfate) .... 2 Puffs Inhaled Every 6 Hours As Needed Wheeze 8)  Advair 9)  Cephalexin 500 Mg Caps (Cephalexin) .Marland Kitchen.. 1 Tab By Mouth Three Times A Day X 7 Days  Allergies: 1)  ! Sulfa  Past History:  Past medical, surgical, family and social histories (including risk factors) reviewed, and no changes noted (except as noted below).  Past Medical History: Reviewed history from 01/05/2009 and no changes required. Allergic rhinitis GERD Hypertension Chronic Headaches Colon Polyps 2005 (adenoma), none 2008  Past Surgical History: Reviewed history from 10/10/2008 and no changes required. Tonsillectomy Partial hysterectomy for prolapse, no cancer, both ovaries remain  Family History: Reviewed history from 12/11/2008  and no changes required. father: CAD MI age 36, HTN, high chol mother: CAD, CABG age 73, HTN, high chol sister: aneurysm in brain age 18 brother: congenital heart defect..? ASD/VSD brothrer: HTN PGM: breast cancer Family History of Colon Cancer:Paternal uncle and grandmother   Social History: Reviewed history from 10/10/2008 and no changes required. 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 CV:  Denies chest pain or discomfort. Resp:  Denies shortness of breath. GI:  Denies abdominal pain and indigestion; GERD well controlled. HAs decrease wine..  Physical Exam  General:  Well-developed,well-nourished,in no acute distress; alert,appropriate and cooperative throughout examination Mouth:  Oral mucosa and oropharynx without lesions or exudates.  Teeth in good repair. Neck:  no carotid bruit or thyromegaly no cervical or supraclavicular lymphadenopathy  Lungs:  Normal respiratory effort, chest expands symmetrically. Lungs are clear to auscultation, no crackles or wheezes. Heart:  Normal rate and regular rhythm. S1 and S2 normal without gallop, murmur, click, rub or other extra sounds. Pulses:  R and L posterior tibial pulses are full and equal bilaterally  Extremities:  3rd diiti right finger distal..erythema, pain laterally, discharge at nail edge.    Impression & Recommendations:  Problem # 1:  PARONYCHIA, FINGER (ICD-681.02) Treat with antibitoics x 7 days and warm water soaks..if not resolving by next week..will need to I and D with scalpel.  The following medications were removed from the medication list:    Doxycycline Hyclate 100 Mg Caps (Doxycycline hyclate) ..... One tab by mouth two times a day Her updated medication list for this problem includes:    Cephalexin 500 Mg Caps (Cephalexin) .Marland Kitchen... 1 tab by mouth three times a day x 7 days  Problem # 2:  HYPERTENSION (ICD-401.9) Well controlled. Continue current medication. Has beenon metoprolol log time..doubt cause of revcent fatigue.  Her updated medication list for this problem includes:    Metoprolol Tartrate 25 Mg Tabs (Metoprolol tartrate) .Marland Kitchen... 1 tab by mouth two times a day  Problem # 3:  GERD (ICD-530.81) Well controlled on aciphex. Resolved chronic cough.  Her updated medication list for this problem includes:    Lansoprazole 30 Mg Cpdr  (Lansoprazole) .Marland Kitchen... 1 tab  by mouth daily  Problem # 4:  FATIGUE (ICD-780.79) Deconditioning since recent illness. Now SPOB resolved, but remains tired. Recommended exaercise weight loss. Recent cbc and STH nml.   Complete Medication List: 1)  Imitrex 50 Mg Tabs (Sumatriptan succinate) .... Take 1 to 2  tablet by mouth once a day as needed migraine, may repeat x 2 tab  if no relief in 2 hours. may 200 mg in 24 hours. 2)  Duke's Magic Mouthwash  .... Swish and swallow as needed 3)  Metoprolol Tartrate 25 Mg Tabs (Metoprolol tartrate) .Marland Kitchen.. 1 tab by mouth two times a day 4)  Lansoprazole 30 Mg Cpdr (Lansoprazole) .Marland Kitchen.. 1 tab  by mouth daily 5)  Calcium 600/vitamin D 600-400 Mg-unit Chew (Calcium carbonate-vitamin d) .Marland Kitchen.. 1 tab by mouth two times a day 6)  Ferrous Sulfate 325 (65 Fe) Mg Tabs (Ferrous sulfate) .... Take 1 tablet by mouth two times a day (sometimes) 7)  Proair Hfa 108 (90 Base) Mcg/act Aers (Albuterol sulfate) .... 2 puffs inhaled every 6 hours as needed wheeze 8)  Advair  9)  Cephalexin 500 Mg Caps (Cephalexin) .Marland Kitchen.. 1 tab  by mouth three times a day x 7 days  Hypertension Assessment/Plan:      The patient's hypertensive risk group is category B: At least one risk factor (excluding diabetes) with no target organ damage.  Her calculated 10 year risk of coronary heart disease is 6 %.  Today's blood pressure is 120/70.  Her blood pressure goal is < 140/90.  Patient Instructions: 1)  Warm water soaks 2-3 times daily. 2)  Start anitbitoics daily. 3)  Call if redness in finger not resolving by next week. 4)  If not improving would recommend inscision next Tuesday.  Prescriptions: CEPHALEXIN 500 MG CAPS (CEPHALEXIN) 1 tab by mouth three times a day x 7 days  #21 x 0   Entered and Authorized by:   Kerby Nora MD   Signed by:   Kerby Nora MD on 09/12/2009   Method used:   [facility]        [facility] [facility]       [address]       [place],  [place]  [address]       Ph: [phone] or [phone]       Fax: [phone]   RxID:   [id number]   Current Allergies (reviewed today): ! SULFA

## 2010-07-07 NOTE — Progress Notes (Signed)
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 Initial call taken by: Lowella Petties CMA,  November 07, 2009 3:15 PM

## 2010-07-07 NOTE — Progress Notes (Signed)
Summary: called with update  Phone Note Call from Patient   Caller: Patient Call For: Kerby Nora MD Summary of Call: Pt had called yesterday to report that she still had rash, taking benedry and had gotten zyrtec to try.  Temp was still around 99.  She has short term disability at work that she will use for the time that she was out with this, we should be gettting paper work. Initial call taken by: Lowella Petties CMA,  July 24, 2009 10:57 AM  Follow-up for Phone Call        She needs to be reevaluated today or tommorow..I cannot tell from phone notes what rash looks like, how bronchitis is doing etc.  I believe she had an appt but it had to be rescheduled. Please work her in somewhere before the weekend given she is not much better, and how new rash. I cannot complete disabiltiy paperwork unless she is seen again...(should not need to be out of work for a rash, unless severe) Follow-up by: Kerby Nora MD,  July 24, 2009 11:06 AM  Additional Follow-up for Phone Call Additional follow up Details #1::        Scheduled appointment for Friday.  Additional Follow-up by: Melody Comas,  July 24, 2009 11:42 AM

## 2010-07-07 NOTE — Progress Notes (Signed)
Summary: needs cough med  Phone Note Call from Patient Call back at Home Phone [phone]   Caller: Patient Call For: Kerby Nora MD Summary of Call: Patient was seen on wed by dr. Hetty Ely. She says that her cough is so bad that she can't even sleep at night. She wants to know if she can get something for the cough [facility] [address]. Initial call taken by: Melody Comas,  August 01, 2009 10:49 AM  Follow-up for Phone Call        Rx Called In Follow-up by: Benny Lennert CMA Duncan Dull),  August 01, 2009 2:48 PM    New/Updated Medications: HYDROCODONE-HOMATROPINE 5-1.5 MG/5ML SYRP (HYDROCODONE-HOMATROPINE) 1 tsp by mouth at bedtime as needed cough Prescriptions: HYDROCODONE-HOMATROPINE 5-1.5 MG/5ML SYRP (HYDROCODONE-HOMATROPINE) 1 tsp by mouth at bedtime as needed cough  #8 oz x 0   Entered and Authorized by:   Kerby Nora MD   Signed by:   Kerby Nora MD on 08/01/2009   Method used:   Telephoned to ...       [facility])       [address] [address]       [place], [place]  [address]       Ph: [phone]       Fax: [phone]   RxID:   [id number]

## 2010-07-07 NOTE — Progress Notes (Signed)
Summary: worse today  Phone Note Call from Patient Call back at Home Phone [phone]   Caller: Patient Summary of Call: Pt was seen on friday, feels much worse today.  Cough is worse but congestion is breaking up with mucinex, cough medicine is helping.  Any suggestions?  She went back to work for a couple of days but doesnt think she can go today.  Please advise[facility] Initial call taken by: Lowella Petties CMA,  July 28, 2009 9:40 AM  Follow-up for Phone Call        I think that plan of care sounds appropriate.  Viral illnesses can often take 10-14 days to clear and lung irritation can cause mild coughing 2 weeks afterwards Follow-up by: Hannah Beat MD,  July 28, 2009 9:58 AM  Additional Follow-up for Phone Call Additional follow up Details #1::        Advised pt, she is asking if she can have a work note.  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   Additional Follow-up by: Lowella Petties CMA,  July 28, 2009 10:03 AM    Additional Follow-up for Phone Call Additional follow up Details #2::    extend work note until wed. Follow-up by: Hannah Beat MD,  July 28, 2009 10:19 AM  Additional Follow-up for Phone Call Additional follow up Details #3:: Details for Additional Follow-up Action Taken: patient will advise manager and if work note needed she will call.Consuello Masse CMA  Additional Follow-up by: Benny Lennert CMA Duncan Dull),  July 28, 2009 10:22 AM

## 2010-07-07 NOTE — Assessment & Plan Note (Signed)
Summary: right hand middle finger infected/rbh   Vital Signs:  Patient profile:   60 year old female Weight:      175.25 pounds BMI:     30.19 Temp:     98.3 degrees F oral Pulse rate:   64 / minute Pulse rhythm:   regular BP sitting:   130 / 82  (left arm) Cuff size:   regular  Vitals Entered By: Linde Gillis CMA Duncan Dull) (June 10, 2009 12:07 PM) CC: infected finger, swollen, painful to touch   History of Present Illness: [name] is a 60 y/o female presenting today with an infected R 3rd digit.  She first noticed the infection about 3-4 weeks ago and was prescribed doxycycline which did not resolve the infection.  After the doxycycline regimen, the lateral 1/3 of her fingernail was surgically removed and she was started on cephalexin which she finished yesterday.  To clean the finger, she uses soap and water and soaks her finger 3 times each day.  Today, she is concerned becasue the finger is still swollen, erythematous, and tender to the touch.  Problems Prior to Update: 1)  Ingrown Nail  (ICD-703.0) 2)  Paronychia, Finger  (ICD-681.02) 3)  Uri  (ICD-465.9) 4)  Chest Pain  (ICD-786.50) 5)  Iron Deficiency  (ICD-280.9) 6)  Cough, Chronic  (ICD-786.2) 7)  Screening For Lipoid Disorders  (ICD-V77.91) 8)  Perimenopausal Syndrome  (ICD-627.9) 9)  Palpitations, Hx of  (ICD-V12.50) 10)  Abdominal Pain, Upper  (ICD-789.09) 11)  Colonic Polyps, Adenomatous, Hx of  (ICD-V12.72) 12)  Hypertension  (ICD-401.9) 13)  Gerd  (ICD-530.81) 14)  Allergic Rhinitis  (ICD-477.9) 15)  Degenerative Disc Disease, Cervical Spine  (ICD-722.4) 16)  Migraine, Common  (ICD-346.10) 17)  Osteoarthritis  (ICD-715.90)  Medications Prior to Update: 1)  Imitrex 50 Mg Tabs (Sumatriptan Succinate) .... Take 1 To 2  Tablet By Mouth Once A Day As Needed Migraine, May Repeat X 2 Tab  If No Relief in 2 Hours. May 200 Mg in 24 Hours. 2)  Duke's Magic Mouthwash .... Swish and Swallow As Needed 3)  Toprol Xl 25  Mg Xr24h-Tab (Metoprolol Succinate) .... Take 1 Tablet By Mouth Two Times A Day 4)  Lansoprazole 30 Mg Cpdr (Lansoprazole) .Marland Kitchen.. 1 Tab  By Mouth Daily 5)  Zyrtec Allergy 10 Mg Tabs (Cetirizine Hcl) .... or Claritin As Needed 6)  Astepro 137 Mcg/spray Soln (Azelastine Hcl) .... As Needed 7)  Tylenol 325 Mg Tabs (Acetaminophen) .... 650 Mg. Each As Needed 8)  Zicam .... As Needed 9)  Calcium 600/vitamin D 600-400 Mg-Unit Chew (Calcium Carbonate-Vitamin D) .Marland Kitchen.. 1 Tab By Mouth Two Times A Day 10)  Ferrous Sulfate 325 (65 Fe) Mg Tabs (Ferrous Sulfate) .... Take 1 Tablet By Mouth Two Times A Day 11)  Hycodan Suspension .Marland Kitchen.. 1 - 2 Tsp By Mouth At Bedtime As Needed Cough 12)  Hydrocodone-Acetaminophen 5-325 Mg Tabs (Hydrocodone-Acetaminophen) .Marland Kitchen.. 1 By Mouth Up To 4 Times Per Day As Needed For Pain  Allergies: 1)  ! Sulfa  Physical Exam  General:  Well-developed,well-nourished,in no acute distress; alert,appropriate and cooperative throughout examination, nontoxic appearing. Head:  Normocephalic and atraumatic without obvious abnormalities. No apparent alopecia or balding. Lungs:  Normal respiratory effort, chest expands symmetrically. Lungs are clear to auscultation, no crackles or wheezes. Heart:  Normal rate and regular rhythm. S1 and S2 normal without gallop, murmur, click, rub or other extra sounds.   Wrist/Hand Exam  Inspection:    R 3rd  digit erythematous and swollen, lateral 1/3 of nail removed in procedure  Palpation:    Tender to touch   Impression & Recommendations:  Problem # 1:  PARONYCHIA, FINGER (ICD-681.02) Assessment Unchanged  With lack of response to two very good skin infection Abs, must think about Herpetic Whitlow. Will presumptively treat with Acyclovir. See back on Thu.  Complete Medication List: 1)  Imitrex 50 Mg Tabs (Sumatriptan succinate) .... Take 1 to 2  tablet by mouth once a day as needed migraine, may repeat x 2 tab  if no relief in 2 hours. may 200 mg  in 24 hours. 2)  Duke's Magic Mouthwash  .... Swish and swallow as needed 3)  Toprol Xl 25 Mg Xr24h-tab (Metoprolol succinate) .... Take 1 tablet by mouth two times a day 4)  Lansoprazole 30 Mg Cpdr (Lansoprazole) .Marland Kitchen.. 1 tab  by mouth daily 5)  Zyrtec Allergy 10 Mg Tabs (cetirizine Hcl)  .... Or claritin as needed 6)  Tylenol 325 Mg Tabs (Acetaminophen) .... 650 mg. each as needed 7)  Zicam  .... As needed 8)  Calcium 600/vitamin D 600-400 Mg-unit Chew (Calcium carbonate-vitamin d) .Marland Kitchen.. 1 tab by mouth two times a day 9)  Ferrous Sulfate 325 (65 Fe) Mg Tabs (Ferrous sulfate) .... Take 1 tablet by mouth two times a day 10)  Hycodan Suspension  .Marland Kitchen.. 1 - 2 tsp by mouth at bedtime as needed cough 11)  Hydrocodone-acetaminophen 5-325 Mg Tabs (Hydrocodone-acetaminophen) .Marland Kitchen.. 1 by mouth up to 4 times per day as needed for pain 12)  Zovirax 200 Mg Caps (Acyclovir) .... One tab by mouth 5 times daily for 10 days  Patient Instructions: 1)  RTC 2 days. Prescriptions: ZOVIRAX 200 MG CAPS (ACYCLOVIR) one tab by mouth 5 times daily for 10 days  #50 x 0   Entered and Authorized by:   Shaune Leeks MD   Signed by:   Shaune Leeks MD on 06/10/2009   Method used:   [facility]        [facility]  [address] [facility]       [address]       [place], [place]  [address]       Ph: [phone] or [phone]       Fax: [phone]   RxID:   [id number]   Current Allergies (reviewed today): ! SULFA

## 2010-07-07 NOTE — Letter (Signed)
Summary: Out of Work  [facility]  [address] [place], [place] [address]   Phone: [phone]  Fax: [phone]    August 12, 2009   Employee:  ASTORIA CONDON    To Whom It May Concern:   For Medical reasons, please excuse the above named employee from work for the following dates:  Start:   Feburary 7 2011  End:   August 12 2009  If you need additional information, please feel free to contact our office.         Sincerely,    Hannah Beat MD

## 2010-07-07 NOTE — Assessment & Plan Note (Signed)
Summary: RASH/ALC PER DR. Ermalene Searing   Vital Signs:  Patient profile:   60 year old female Height:      64 inches Weight:      170.8 pounds BMI:     29.42 Temp:     98.2 degrees F oral Pulse rate:   72 / minute Pulse rhythm:   regular BP sitting:   130 / 90  (left arm) Cuff size:   large  Vitals Entered By: Benny Lennert CMA Duncan Dull) (July 25, 2009 12:02 PM)  History of Present Illness: Chief complaint rash  Noted rash  on chest , neck,legs, back  puritic after starting azithromycin on day 3-4 of antibiotic. Now 5 days off this medicaiton and rash improving.  Cough, SOB  better, but  congestion not much better. Some chest congestion, green, yellow discharge with occ cough Albuterol helps as needed.  Advair low dose started has helped some. Hoarse voice improving.    "I am definately better, but I wish it was faster"  z Problems Prior to Update: 1)  Rash and Other Nonspecific Skin Eruption  (ICD-782.1) 2)  Uri  (ICD-465.9) 3)  Routine Gynecological Examination  (ICD-V72.31) 4)  Preventive Health Care  (ICD-V70.0) 5)  Bronchitis- Acute  (ICD-466.0) 6)  Other Screening Mammogram  (ICD-V76.12) 7)  Ingrown Nail  (ICD-703.0) 8)  Paronychia, Finger  (ICD-681.02) 9)  Uri  (ICD-465.9) 10)  Chest Pain  (ICD-786.50) 11)  Iron Deficiency  (ICD-280.9) 12)  Cough, Chronic  (ICD-786.2) 13)  Screening For Lipoid Disorders  (ICD-V77.91) 14)  Perimenopausal Syndrome  (ICD-627.9) 15)  Palpitations, Hx of  (ICD-V12.50) 16)  Abdominal Pain, Upper  (ICD-789.09) 17)  Colonic Polyps, Adenomatous, Hx of  (ICD-V12.72) 18)  Hypertension  (ICD-401.9) 19)  Gerd  (ICD-530.81) 20)  Allergic Rhinitis  (ICD-477.9) 21)  Degenerative Disc Disease, Cervical Spine  (ICD-722.4) 22)  Migraine, Common  (ICD-346.10) 23)  Osteoarthritis  (ICD-715.90)  Current Medications (verified): 1)  Imitrex 50 Mg Tabs (Sumatriptan Succinate) .... Take 1 To 2  Tablet By Mouth Once A Day As Needed Migraine, May  Repeat X 2 Tab  If No Relief in 2 Hours. May 200 Mg in 24 Hours. 2)  Duke's Magic Mouthwash .... Swish and Swallow As Needed 3)  Metoprolol Tartrate 25 Mg Tabs (Metoprolol Tartrate) .Marland Kitchen.. 1 Tab By Mouth Two Times A Day 4)  Lansoprazole 30 Mg Cpdr (Lansoprazole) .Marland Kitchen.. 1 Tab  By Mouth Daily 5)  Calcium 600/vitamin D 600-400 Mg-Unit Chew (Calcium Carbonate-Vitamin D) .Marland Kitchen.. 1 Tab By Mouth Two Times A Day 6)  Ferrous Sulfate 325 (65 Fe) Mg Tabs (Ferrous Sulfate) .... Take 1 Tablet By Mouth Two Times A Day (Sometimes) 7)  Proair Hfa 108 (90 Base) Mcg/act Aers (Albuterol Sulfate) .... 2 Puffs Inhaled Every 6 Hours As Needed Wheeze 8)  Advair  Allergies: 1)  ! Sulfa  Past History:  Past medical, surgical, family and social histories (including risk factors) reviewed, and no changes noted (except as noted below).  Past Medical History: Reviewed history from 01/05/2009 and no changes required. Allergic rhinitis GERD Hypertension Chronic Headaches Colon Polyps 2005 (adenoma), none 2008  Past Surgical History: Reviewed history from 10/10/2008 and no changes required. Tonsillectomy Partial hysterectomy for prolapse, no cancer, both ovaries remain  Family History: Reviewed history from 12/11/2008 and no changes required. father: CAD MI age 23, HTN, high chol mother: CAD, CABG age 29, HTN, high chol sister: aneurysm in brain age 14 brother: congenital heart defect..? ASD/VSD brothrer: HTN PGM:  breast cancer Family History of Colon Cancer:Paternal uncle and grandmother   Social History: Reviewed history from 10/10/2008 and no changes required. 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 CV:  Denies chest pain or discomfort. Resp:  Denies shortness of breath. GI:  Denies abdominal pain. GU:  Denies  dysuria.  Physical Exam  General:  Well-developed,well-nourished,in no acute distress; alert,appropriate and cooperative throughout examination Head:  no ttp B max sinuses Ears:  clear B TMs Nose:  clear nasal discharge Mouth:  MMM Neck:  no carotid bruit or thyromegaly  Lungs:  Normal respiratory effort, chest expands symmetrically. Lungs are clear to auscultation, no crackles or wheezes. Heart:  Normal rate and regular rhythm. S1 and S2 normal without gallop, murmur, click, rub or other extra sounds. Skin:  scattered erythematous papules, no blisters, no pustules, no oral/mucosal lesions   Impression & Recommendations:  Problem # 1:  URI (ICD-465.9) No clear evidence of continue bacterial bronchitis.Marland Kitchen pt with lingering congetion.  Continue supportive care. MAy stop advair at this time. Use albuterol as needed.   Problem # 2:  RASH AND OTHER NONSPECIFIC SKIN ERUPTION (ICD-782.1) Resolving..likely due to azithromycin vs viral rash. Continue to use antihistamines as needed.   Complete Medication List: 1)  Imitrex 50 Mg Tabs (Sumatriptan succinate) .... Take 1 to 2  tablet by mouth once a day as needed migraine, may repeat x 2 tab  if no relief in 2 hours. may 200 mg in 24 hours. 2)  Duke's Magic Mouthwash  .... Swish and swallow as needed 3)  Metoprolol Tartrate 25 Mg Tabs (Metoprolol tartrate) .Marland Kitchen.. 1 tab by mouth two times a day 4)  Lansoprazole 30 Mg Cpdr (Lansoprazole) .Marland Kitchen.. 1 tab  by mouth daily 5)  Calcium 600/vitamin D 600-400 Mg-unit Chew (Calcium carbonate-vitamin d) .Marland Kitchen.. 1 tab by mouth two times a day 6)  Ferrous Sulfate 325 (65 Fe) Mg Tabs (Ferrous sulfate) .... Take 1 tablet by mouth two times a day (sometimes) 7)  Proair Hfa 108 (90 Base) Mcg/act Aers (Albuterol sulfate) .... 2 puffs inhaled every 6 hours as needed wheeze 8)  Advair   Current Allergies (reviewed today): ! SULFA

## 2010-07-07 NOTE — Miscellaneous (Signed)
Summary: [facility] PV  Clinical Lists Changes  Medications: Added new medication of MOVIPREP 100 GM  SOLR (PEG-KCL-NACL-NASULF-NA ASC-C) As per prep instructions. - Signed Rx of MOVIPREP 100 GM  SOLR (PEG-KCL-NACL-NASULF-NA ASC-C) As per prep instructions.;  #1 x 0;  Signed;  Entered by: Ezra Sites RN;  Authorized by: Iva Boop MD, [facility];  Method used: [id number] [address], [place], [place]  [address], Ph: [phone], Fax: [phone] Observations: Added new observation of ALLERGY REV: Done (05/07/2010 10:40)    Prescriptions: MOVIPREP 100 GM  SOLR (PEG-KCL-NACL-NASULF-NA ASC-C) As per prep instructions.  #1 x 0   Entered by:   Ezra Sites RN   Authorized by:   Iva Boop MD, [facility]   Signed by:   Ezra Sites RN on 05/07/2010   Method used:   [facility]ically to        [id number]       [address]       [place], [place]  [address]       Ph: [phone]       Fax: [phone]   RxID:   [id number]   Appended Document: LEC PV Pt says she got a call from Dr. Marva Panda office to repeat colonoscopy in 3 years instead of 5 years.    We have last colonoscopy report from 2008 but no path report.  Pt signed authorization form to obtain path reports from Dr. Marva Panda office.  Information given to [place].

## 2010-07-07 NOTE — Letter (Signed)
Summary: Out of Work  [employer] at [facility]  [address] [place], [place] [address]   Phone: [phone]  Fax: [phone]    July 15, 2009   Employee:  Erma Heritage    To Whom It May Concern:   For Medical reasons, please excuse the above named employee from work for the following dates:  Start:   07/15/2009  End:   May return to work when she has been fever free for at least 24 hours.  If you need additional information, please feel free to contact our office.         Sincerely,    Linde Gillis CMA (AAMA)

## 2010-07-07 NOTE — Progress Notes (Signed)
  Phone Note Outgoing Call   Details for Reason: PAth results Summary of Call: Recent vulvar bx by GYN showe extramamillary paget's disease. Sees Dr. Okey Dupre....referred to Dr. Loree Fee Oct 7th.  Ansewered any questions the pt had. Initial call taken by: Kerby Nora MD,  February 24, 2010 6:06 PM

## 2010-07-07 NOTE — Procedures (Signed)
Summary: Colonoscopy: Tubular Adenoma  Colonoscopy: Tubular Adenoma   Imported By: Francee Piccolo CMA (AAMA) 05/15/2010 09:04:11  _____________________________________________________________________  External Attachment:    Type:   Image     Comment:   External Document

## 2010-07-07 NOTE — Assessment & Plan Note (Signed)
Summary: ROA FOR FINGER   Vital Signs:  Patient profile:   60 year old female Height:      64 inches Weight:      175.75 pounds BMI:     30.28 Temp:     98.6 degrees F oral Pulse rate:   84 / minute Pulse rhythm:   regular BP sitting:   152 / 70  (left arm) Cuff size:   regular  Vitals Entered By: Delilah Shan CMA Duncan Dull) (September 16, 2009 11:55 AM) CC: Finger infection (I & D)   History of Present Illness: Pt here for follow up paronychia. Was seen on 4/8 by Dr. Ermalene Searing, placed on Keflex 500 mg two times a day.  Has been a chronic issue for her on her third finger on right hand. Had to have part of nail removed in November, since then it has been chronicallhy swollen but past month swelling and redness have worsened.  Improved with Keflex but she would like for [place] to try and I and D it today. No fevers, chills, nausea or vomiting.  Has tried warm soaks.  Current Medications (verified): 1)  Imitrex 50 Mg Tabs (Sumatriptan Succinate) .... Take 1 To 2  Tablet By Mouth Once A Day As Needed Migraine, May Repeat X 2 Tab  If No Relief in 2 Hours. May 200 Mg in 24 Hours. 2)  Duke's Magic Mouthwash .... Swish and Swallow As Needed 3)  Metoprolol Tartrate 25 Mg Tabs (Metoprolol Tartrate) .Marland Kitchen.. 1 Tab By Mouth Two Times A Day 4)  Lansoprazole 30 Mg Cpdr (Lansoprazole) .Marland Kitchen.. 1 Tab  By Mouth Daily 5)  Calcium 600/vitamin D 600-400 Mg-Unit Chew (Calcium Carbonate-Vitamin D) .Marland Kitchen.. 1 Tab By Mouth Two Times A Day 6)  Ferrous Sulfate 325 (65 Fe) Mg Tabs (Ferrous Sulfate) .... Take 1 Tablet By Mouth Two Times A Day (Sometimes) 7)  Proair Hfa 108 (90 Base) Mcg/act Aers (Albuterol Sulfate) .... 2 Puffs Inhaled Every 6 Hours As Needed Wheeze 8)  Advair 9)  Cephalexin 500 Mg Caps (Cephalexin) .Marland Kitchen.. 1 Tab By Mouth Three Times A Day X 7 Days  Allergies: 1)  ! Sulfa  Review of Systems      See HPI General:  Denies chills and fever. GI:  Denies nausea and vomiting.  Physical Exam  General:   Well-developed,well-nourished,in no acute distress; alert,appropriate and cooperative throughout examination Extremities:  3rd diiti right finger distal, no erythema, no discharge   Impression & Recommendations:  Problem # 1:  PARONYCHIA, FINGER (ICD-681.02) Assessment Deteriorated  I & D performed (see procedure note).  Only  a little pus expressed, mainly blood.  Advised warm soaks qid, topical abx and finish systemic abx.  LIkely a chronic paronychia. Her updated medication list for this problem includes:    Cephalexin 500 Mg Caps (Cephalexin) .Marland Kitchen... 1 tab by mouth three times a day x 7 days  Orders: I&D Abscess, Simple / Single (10060)  Complete Medication List: 1)  Imitrex 50 Mg Tabs (Sumatriptan succinate) .... Take 1 to 2  tablet by mouth once a day as needed migraine, may repeat x 2 tab  if no relief in 2 hours. may 200 mg in 24 hours. 2)  Duke's Magic Mouthwash  .... Swish and swallow as needed 3)  Metoprolol Tartrate 25 Mg Tabs (Metoprolol tartrate) .Marland Kitchen.. 1 tab by mouth two times a day 4)  Lansoprazole 30 Mg Cpdr (Lansoprazole) .Marland Kitchen.. 1 tab  by mouth daily 5)  Calcium 600/vitamin D 600-400 Mg-unit Chew (  Calcium carbonate-vitamin d) .Marland Kitchen.. 1 tab by mouth two times a day 6)  Ferrous Sulfate 325 (65 Fe) Mg Tabs (Ferrous sulfate) .... Take 1 tablet by mouth two times a day (sometimes) 7)  Proair Hfa 108 (90 Base) Mcg/act Aers (Albuterol sulfate) .... 2 puffs inhaled every 6 hours as needed wheeze 8)  Advair  9)  Cephalexin 500 Mg Caps (Cephalexin) .Marland Kitchen.. 1 tab by mouth three times a day x 7 days   Procedure Note Last Tetanus: given (12/06/2006)  Incision & Drainage: The patient complains of pain, redness, and irritation but denies discharge and fever. Date of onset: 08/19/2009 Indication: inflamed lesion  Procedure # 1: I & D    Region: lateral    Location: nail bed    Instrument used: #11 blade    Anesthesia: 1% lidocaine w/o epinephrine  Cleaned and prepped with:  betadine Wound dressing: bacitracin Additional Instructions: warm soaks qid, topical abx, finish course of keflex  Current Allergies (reviewed today): ! SULFA

## 2010-07-07 NOTE — Procedures (Signed)
Summary: Colonoscopy: Hemorrhoids  Colonoscopy: Hemorrhoids   Imported By: Francee Piccolo CMA (AAMA) 05/15/2010 09:05:25  _____________________________________________________________________  External Attachment:    Type:   Image     Comment:   External Document  Appended Document: Colonoscopy: Hemorrhoids   Colonoscopy  Procedure date:  10/10/2006  Findings:      Internal hemorrhouids  Procedure performed for 4 mm adenoma found in 2005 (follow-up for that)  Comments:      Repeat colonoscopy in 5 years.   Procedures Next Due Date:    Colonoscopy: 10/2011

## 2010-07-07 NOTE — Assessment & Plan Note (Signed)
Summary: COUGH, FEVER/ 10:30   Vital Signs:  Patient profile:   60 year old female Weight:      174 pounds BMI:     29.97 O2 Sat:      95 % on Room air Temp:     99.0 degrees F oral Pulse rate:   72 / minute Pulse rhythm:   regular BP sitting:   124 / 70  (left arm) Cuff size:   large  Vitals Entered By: Linde Gillis CMA Duncan Dull) (July 15, 2009 10:43 AM)  O2 Flow:  Room air  Serial Vital Signs/Assessments:                                PEF    PreRx  PostRx Time      O2 Sat  O2 Type     L/min  L/min  L/min   By 12:16 PM                      250    250    380     Nikki Thorpe CMA (AAMA) Third peak flow was 280 instead of 380.  Linde Gillis CMA Duncan Dull)  July 15, 2009 12:18 PM    Acute Visit History:      The patient complains of cough, fever, and headache.  These symptoms began 2 days ago.  She denies earache, sinus problems, and sore throat.  Other comments include: body ache, sore neck Using tylenol and guafenesin Tried old homattropine and hydrocodone...had nausea with it.        Her highest temperature has been 101.        The patient notes wheezing.  The character of the cough is described as nonproductive.  There is no history of shortness of breath associated with her cough.        Problems Prior to Update: 1)  Routine Gynecological Examination  (ICD-V72.31) 2)  Preventive Health Care  (ICD-V70.0) 3)  Bronchitis- Acute  (ICD-466.0) 4)  Other Screening Mammogram  (ICD-V76.12) 5)  Ingrown Nail  (ICD-703.0) 6)  Paronychia, Finger  (ICD-681.02) 7)  Uri  (ICD-465.9) 8)  Chest Pain  (ICD-786.50) 9)  Iron Deficiency  (ICD-280.9) 10)  Cough, Chronic  (ICD-786.2) 11)  Screening For Lipoid Disorders  (ICD-V77.91) 12)  Perimenopausal Syndrome  (ICD-627.9) 13)  Palpitations, Hx of  (ICD-V12.50) 14)  Abdominal Pain, Upper  (ICD-789.09) 15)  Colonic Polyps, Adenomatous, Hx of  (ICD-V12.72) 16)  Hypertension  (ICD-401.9) 17)  Gerd  (ICD-530.81) 18)  Allergic Rhinitis   (ICD-477.9) 19)  Degenerative Disc Disease, Cervical Spine  (ICD-722.4) 20)  Migraine, Common  (ICD-346.10) 21)  Osteoarthritis  (ICD-715.90)  Current Medications (verified): 1)  Imitrex 50 Mg Tabs (Sumatriptan Succinate) .... Take 1 To 2  Tablet By Mouth Once A Day As Needed Migraine, May Repeat X 2 Tab  If No Relief in 2 Hours. May 200 Mg in 24 Hours. 2)  Duke's Magic Mouthwash .... Swish and Swallow As Needed 3)  Toprol Xl 25 Mg Xr24h-Tab (Metoprolol Succinate) .... Take 1 Tablet By Mouth Two Times A Day 4)  Lansoprazole 30 Mg Cpdr (Lansoprazole) .Marland Kitchen.. 1 Tab  By Mouth Daily 5)  Calcium 600/vitamin D 600-400 Mg-Unit Chew (Calcium Carbonate-Vitamin D) .Marland Kitchen.. 1 Tab By Mouth Two Times A Day 6)  Ferrous Sulfate 325 (65 Fe) Mg Tabs (Ferrous Sulfate) .... Take 1 Tablet By Mouth Two  Times A Day (Sometimes) 7)  Azithromycin 250 Mg Tabs (Azithromycin) .... 2 Tab By Mouth X 1 Day Then 1 Tab By Mouth Daily 8)  Proair Hfa 108 (90 Base) Mcg/act Aers (Albuterol Sulfate) .... 2 Puffs Inhaled Every 6 Hours As Needed Wheeze  Allergies: 1)  ! Sulfa  Past History:  Past medical, surgical, family and social histories (including risk factors) reviewed, and no changes noted (except as noted below).  Past Medical History: Reviewed history from 01/05/2009 and no changes required. Allergic rhinitis GERD Hypertension Chronic Headaches Colon Polyps 2005 (adenoma), none 2008  Past Surgical History: Reviewed history from 10/10/2008 and no changes required. Tonsillectomy Partial hysterectomy for prolapse, no cancer, both ovaries remain  Family History: Reviewed history from 12/11/2008 and no changes required. father: CAD MI age 79, HTN, high chol mother: CAD, CABG age 3, HTN, high chol sister: aneurysm in brain age 14 brother: congenital heart defect..? ASD/VSD brothrer: HTN PGM: breast cancer Family History of Colon Cancer:Paternal uncle and grandmother   Social History: Reviewed history from  10/10/2008 and no changes required. 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 CV:  Denies chest pain or discomfort. Resp:  Denies shortness of breath. GI:  Denies abdominal pain. GU:  Denies dysuria.  Physical Exam  General:  fatigued appearing female in NAD  Head:  no maxillary sinus ttp Ears:  clear fluids B TMs Nose:  clear nasal discharge Mouth:  MMM Neck:  no carotid bruit or thyromegaly no cervical or supraclavicular lymphadenopathy  Lungs:  Diffuse exp wheeze. No increase work of breathing.  Heart:  Normal rate and regular rhythm. S1 and S2 normal without gallop, murmur, click, rub or other extra sounds. Abdomen:  Bowel sounds positive,abdomen soft and non-tender without masses, organomegaly or hernias noted. Pulses:  R and L posterior tibial pulses are full and equal bilaterally  Extremities:  no edema   Impression & Recommendations:  Problem # 1:  BRONCHITIS- ACUTE (ICD-466.0) Treat with antibiotic, mucinex. Use albuterol as needed wheeze. No past hispotry of asthma, but has had chronic cough...? if due to cough variant asthma...consider PFTS when well if cough continues.  Her updated medication list for this problem includes:    Azithromycin 250 Mg Tabs (Azithromycin) .Marland Kitchen... 2 tab by mouth x 1 day then 1 tab by mouth daily    Proair Hfa 108 (90 Base) Mcg/act Aers (Albuterol sulfate) .Marland Kitchen... 2 puffs inhaled every 6 hours as needed wheeze  Complete Medication List: 1)  Imitrex 50 Mg Tabs (Sumatriptan succinate) .... Take 1 to 2  tablet by mouth once a day as needed migraine, may repeat x 2 tab  if no relief in 2 hours. may 200 mg in 24 hours. 2)  Duke's Magic Mouthwash  .... Swish and swallow as needed 3)  Metoprolol Tartrate 25 Mg Tabs (Metoprolol tartrate) .Marland Kitchen.. 1 tab by mouth two times a  day 4)  Lansoprazole 30 Mg Cpdr (Lansoprazole) .Marland Kitchen.. 1 tab  by mouth daily 5)  Calcium 600/vitamin D 600-400 Mg-unit Chew (Calcium carbonate-vitamin d) .Marland Kitchen.. 1 tab by mouth two times a day 6)  Ferrous Sulfate 325 (65 Fe) Mg Tabs (Ferrous sulfate) .... Take 1 tablet by mouth two times a day (sometimes) 7)  Azithromycin 250 Mg Tabs (Azithromycin) .... 2 tab by mouth x 1 day then 1 tab by mouth daily 8)  Proair Hfa 108 (90 Base) Mcg/act Aers (Albuterol sulfate) .... 2 puffs inhaled every 6 hours as needed wheeze  Patient Instructions: 1)  Ask for Nicki..call to clarify metoprolol type for refill.  2)  Follow up if wheezing not improving.  Prescriptions: PROAIR HFA 108 (90 BASE) MCG/ACT AERS (ALBUTEROL SULFATE) 2 puffs inhaled every 6 hours as needed wheeze  #1 x 0   Entered and Authorized by:   Kerby Nora MD   Signed by:   Kerby Nora MD on 07/15/2009   Method used:   [facility]        [id number]       [address]       [place], [place]  [address]       Ph: [phone] or [phone]       Fax: [phone]   RxID:   [id number] AZITHROMYCIN 250 MG TABS (AZITHROMYCIN) 2 tab by mouth x 1 day then 1 tab by mouth daily  #6 x 0   Entered and Authorized by:   Kerby Nora MD   Signed by:   Kerby Nora MD on 07/15/2009   Method used:   [facility] to        [facility] [address] [facility]retail)       [address]       [place], [place]  [address]       Ph: [phone] or [phone]       Fax: [phone]   RxID:   [id number]   Current Allergies (reviewed today): ! SULFA

## 2010-07-07 NOTE — Assessment & Plan Note (Signed)
Summary: 2 day follow up/rbh   Vital Signs:  Patient profile:   60 year old female Weight:      176 pounds Temp:     97.8 degrees F oral Pulse rate:   72 / minute Pulse rhythm:   regular BP sitting:   140 / 86  (left arm) Cuff size:   regular  Vitals Entered By: Sydell Axon LPN (June 12, 2009 10:32 AM) CC: 2 Day follow-up on finger   History of Present Illness: [name] is a 60 y/o caucasian female who presents for a 2 day f/u for the wound on her R 3rd digit.  She has completed courses of doxycycline and cephalexin already.  2 days ago she was prescribed Acyclovir for possible Herpetic Whitlow.  Today she feels like the wound is slightly less inflamed and red though it still has some tenderness to the touch.  Problems Prior to Update: 1)  Ingrown Nail  (ICD-703.0) 2)  Paronychia, Finger  (ICD-681.02) 3)  Uri  (ICD-465.9) 4)  Chest Pain  (ICD-786.50) 5)  Iron Deficiency  (ICD-280.9) 6)  Cough, Chronic  (ICD-786.2) 7)  Screening For Lipoid Disorders  (ICD-V77.91) 8)  Perimenopausal Syndrome  (ICD-627.9) 9)  Palpitations, Hx of  (ICD-V12.50) 10)  Abdominal Pain, Upper  (ICD-789.09) 11)  Colonic Polyps, Adenomatous, Hx of  (ICD-V12.72) 12)  Hypertension  (ICD-401.9) 13)  Gerd  (ICD-530.81) 14)  Allergic Rhinitis  (ICD-477.9) 15)  Degenerative Disc Disease, Cervical Spine  (ICD-722.4) 16)  Migraine, Common  (ICD-346.10) 17)  Osteoarthritis  (ICD-715.90)  Medications Prior to Update: 1)  Imitrex 50 Mg Tabs (Sumatriptan Succinate) .... Take 1 To 2  Tablet By Mouth Once A Day As Needed Migraine, May Repeat X 2 Tab  If No Relief in 2 Hours. May 200 Mg in 24 Hours. 2)  Duke's Magic Mouthwash .... Swish and Swallow As Needed 3)  Toprol Xl 25 Mg Xr24h-Tab (Metoprolol Succinate) .... Take 1 Tablet By Mouth Two Times A Day 4)  Lansoprazole 30 Mg Cpdr (Lansoprazole) .Marland Kitchen.. 1 Tab  By Mouth Daily 5)  Zyrtec Allergy 10 Mg Tabs (Cetirizine Hcl) .... or Claritin As Needed 6)  Tylenol  325 Mg Tabs (Acetaminophen) .... 650 Mg. Each As Needed 7)  Zicam .... As Needed 8)  Calcium 600/vitamin D 600-400 Mg-Unit Chew (Calcium Carbonate-Vitamin D) .Marland Kitchen.. 1 Tab By Mouth Two Times A Day 9)  Ferrous Sulfate 325 (65 Fe) Mg Tabs (Ferrous Sulfate) .... Take 1 Tablet By Mouth Two Times A Day 10)  Hycodan Suspension .Marland Kitchen.. 1 - 2 Tsp By Mouth At Bedtime As Needed Cough 11)  Hydrocodone-Acetaminophen 5-325 Mg Tabs (Hydrocodone-Acetaminophen) .Marland Kitchen.. 1 By Mouth Up To 4 Times Per Day As Needed For Pain 12)  Zovirax 200 Mg Caps (Acyclovir) .... One Tab By Mouth 5 Times Daily For 10 Days  Allergies: 1)  ! Sulfa  Physical Exam  General:  Well-developed,well-nourished,in no acute distress; alert,appropriate and cooperative throughout examination Head:  Normocephalic and atraumatic without obvious abnormalities. No apparent alopecia or balding. Extremities:  Wound on R 3rd digit.  Lateral 1/3 of the nail has been surgically removed.  There is some erythema present though it is improved from 2 days ago.   Impression & Recommendations:  Problem # 1:  PARONYCHIA, FINGER (ICD-681.02) Assessment Improved Looks top be healing. Cont curr therapy.  Complete Medication List: 1)  Imitrex 50 Mg Tabs (Sumatriptan succinate) .... Take 1 to 2  tablet by mouth once a day as needed  migraine, may repeat x 2 tab  if no relief in 2 hours. may 200 mg in 24 hours. 2)  Duke's Magic Mouthwash  .... Swish and swallow as needed 3)  Toprol Xl 25 Mg Xr24h-tab (Metoprolol succinate) .... Take 1 tablet by mouth two times a day 4)  Lansoprazole 30 Mg Cpdr (Lansoprazole) .Marland Kitchen.. 1 tab  by mouth daily 5)  Zyrtec Allergy 10 Mg Tabs (cetirizine Hcl)  .... Or claritin as needed 6)  Tylenol 325 Mg Tabs (Acetaminophen) .... 650 mg. each as needed 7)  Zicam  .... As needed 8)  Calcium 600/vitamin D 600-400 Mg-unit Chew (Calcium carbonate-vitamin d) .Marland Kitchen.. 1 tab by mouth two times a day 9)  Ferrous Sulfate 325 (65 Fe) Mg Tabs (Ferrous  sulfate) .... Take 1 tablet by mouth two times a day 10)  Hycodan Suspension  .Marland Kitchen.. 1 - 2 tsp by mouth at bedtime as needed cough 11)  Hydrocodone-acetaminophen 5-325 Mg Tabs (Hydrocodone-acetaminophen) .Marland Kitchen.. 1 by mouth up to 4 times per day as needed for pain 12)  Zovirax 200 Mg Caps (Acyclovir) .... One tab by mouth 5 times daily for 10 days  Patient Instructions: 1)  Will call Sun to check   Current Allergies (reviewed today): ! SULFA

## 2010-07-07 NOTE — Progress Notes (Signed)
Summary: finger  Phone Note Call from Patient Call back at Home Phone [phone]   Caller: Patient Call For: Kerby Nora MD Summary of Call: [name] wanted to let you know that she took a sewing needle last night and dug into her finger. She says that she pulled something out, but could not tell what it was because it was bloody. She says that it is not a sore as it was and thinks that it may have helped. She is going to give it a couple of days and will call back if pain worsens again.  Initial call taken by: Melody Comas,  Oct 08, 2009 10:21 AM  Follow-up for Phone Call        ok. Ruthe Mannan MD  Oct 08, 2009 11:06 AM

## 2010-07-07 NOTE — Assessment & Plan Note (Signed)
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 Always swollen at that dip joint on right hand, but now red and tender. started a few hours after filing her nails. No fevers, chills, n/v/d or other systemic symptoms.  Current Medications (verified): 1)  Imitrex 50 Mg Tabs (Sumatriptan Succinate) .... Take 1 To 2  Tablet By Mouth Once A Day As Needed Migraine, May Repeat X 2 Tab  If No Relief in 2 Hours. May 200 Mg in 24 Hours. 2)  Duke's Magic Mouthwash .... Swish and Swallow As Needed 3)  Toprol Xl 25 Mg Xr24h-Tab (Metoprolol Succinate) .... Take 1 Tablet By Mouth Two Times A Day 4)  Lansoprazole 30 Mg Cpdr (Lansoprazole) .Marland Kitchen.. 1 Tab  By Mouth Daily 5)  Zyrtec Allergy 10 Mg Tabs (Cetirizine Hcl) .... or Claritin As Needed 6)  Astepro 137 Mcg/spray Soln (Azelastine Hcl) .... As Needed 7)  Tylenol 325 Mg Tabs (Acetaminophen) .... 650 Mg. Each As Needed 8)  Zicam .... As Needed 9)  Calcium 600/vitamin D 600-400 Mg-Unit Chew (Calcium Carbonate-Vitamin D) .Marland Kitchen.. 1 Tab By Mouth Two Times A Day 10)  Ferrous Sulfate 325 (65 Fe) Mg Tabs (Ferrous Sulfate) .... Take 1 Tablet By Mouth Two Times A Day 11)  Hycodan Suspension .Marland Kitchen.. 1 - 2 Tsp By Mouth At Bedtime As Needed Cough 12)  Doxycycline Hyclate 100 Mg Caps (Doxycycline Hyclate) .... Take 1 Tab Twice A Day  Allergies: 1)  ! Sulfa  Review of Systems      See HPI General:  Denies chills and fever. GI:  Denies nausea and vomiting.  Physical Exam  General:   Well-developed,well-nourished,in no acute distress; alert,appropriate and cooperative throughout examination Skin:  Right hand: third finger- chronic changes from OA with redness, warmth and mild tenderness to palpation, non flucutant.   Impression & Recommendations:  Problem # 1:  PARONYCHIA, FINGER (ICD-681.02) Assessment New No visible pus, non fluctuant.  Disucssed liftening nail plate to drain pus but pt wanted to try conservative tx with abx first.  Sulfa allergy, will try doxycycline or clinda  to cover pyogenic bacteria.  Agreed with plan because cellulitis appears to be very localized with no visitble pustule and minor pain.  Red flags given for follow up.  See patient instructions. Her updated medication list for this problem includes:    Doxycycline Hyclate 100 Mg Caps (Doxycycline hyclate) .Marland Kitchen... Take 1 tab twice a day  Complete Medication List: 1)  Imitrex 50 Mg Tabs (Sumatriptan succinate) .... Take 1 to 2  tablet by mouth once a day as needed migraine, may repeat x 2 tab  if no relief in 2 hours. may 200 mg in 24 hours. 2)  Duke's Magic Mouthwash  .... Swish and swallow as needed 3)  Toprol Xl 25 Mg Xr24h-tab (Metoprolol succinate) .... Take 1 tablet by mouth two times a day 4)  Lansoprazole 30 Mg Cpdr (Lansoprazole) .Marland Kitchen.. 1 tab  by mouth daily 5)  Zyrtec Allergy 10 Mg Tabs (cetirizine Hcl)  .... Or claritin as needed 6)  Astepro 137 Mcg/spray Soln (Azelastine hcl) .... As needed 7)  Tylenol 325 Mg Tabs (Acetaminophen) .... 650 mg. each as needed 8)  Zicam  .... As needed 9)  Calcium 600/vitamin D 600-400 Mg-unit Chew (Calcium carbonate-vitamin d) .Marland Kitchen.. 1 tab by mouth two times a day 10)  Ferrous Sulfate 325 (65 Fe) Mg Tabs (Ferrous sulfate) .... Take 1 tablet by mouth two times a day 11)  Hycodan Suspension  .Marland Kitchen.. 1 - 2 tsp by mouth at bedtime as needed cough 12)  Doxycycline Hyclate 100 Mg Caps (Doxycycline hyclate) .... Take 1 tab twice a day  Patient Instructions: 1)  Good  to meet you, Khalaya. 2)  If you have no improvment of symptoms or worsening within 3 days, please call us immediately. 3)  Take your antibiotic as directed. Prescriptions: DOXYCYCLINE HYCLATE 100 MG CAPS (DOXYCYCLINE HYCLATE) Take 1 tab twice a day  #20 x 0   Entered and Authorized by:   Ruthe Mannan MD   Signed by:   Ruthe Mannan MD on 05/23/2009   Method used:   [facility]        [facility]  [address]       [address]       [place], [place]  [address]       Ph: [phone] or [phone]       Fax: [phone]   RxID:   [id number]   Current Allergies (reviewed today): ! SULFA

## 2010-07-07 NOTE — Letter (Signed)
Summary: Generic Letter  [facility]  [address] [place], [place] [address]   Phone: [phone]  Fax: [phone]    07/25/2009  [name] [address] [place], [place]  [address]  To Whom it may Concern:    The above patient has been out of work since Febuary 7th 2011 until Febuary 19th due to Acute Bronchitis. Please excuse patient for these days. [employer] papers still pending. If you have any questions please call our office at [phone].           Sincerely,   Kerby Nora MD

## 2010-07-07 NOTE — Progress Notes (Signed)
Summary: Call a nurse report  Phone Note Other Incoming   Caller: Call a nurse report from 07-18-2009 / Operator:  Abelardo Diesel Summary of Call: Documentation of fax rec'd:   07/18/2009 7:38:36 pm--Patient called b/c she developed a fever on 07-14-09 and in office on 07-15-09 patient was placed on Albuterol and Z-Pak. Temp is 101.0 with wheezing and worsening cough. Patient c/o pain @ ribs with coughing and breathing is more labored, but not severe. Operator advised patient to ER as office is closed. Initial call taken by: Lucious Groves,  July 21, 2009 11:41 AM

## 2010-07-07 NOTE — Progress Notes (Signed)
Summary: fever  Phone Note Call from Patient Call back at [phone]   Caller: Patient Summary of Call: Pt was seen on 2/8 and give z-pack for bronchitis.  She is concerned because she has had a fever for 3 days and it's not coming down.  100.9 this morning.  She's taking 650 mg's of tylenol every 8 hours. Please advise on how long the fever should last. Initial call taken by: Lowella Petties CMA,  July 17, 2009 9:52 AM  Follow-up for Phone Call        if not improving in 1-2 more days or if worse cough or other symptoms - needs to be seen  continue the tylenol and keep me updated  be sure to drink lots of fluids Follow-up by: Judith Part MD,  July 17, 2009 11:12 AM  Additional Follow-up for Phone Call Additional follow up Details #1::        Patient notified as instructed by telephone. Lewanda Rife LPN  July 17, 2009 11:40 AM

## 2010-07-07 NOTE — Progress Notes (Signed)
Summary: Colonoscopy--previous history  Phone Note Outgoing Call   Summary of Call: Pt has colonoscopy on Monday, 05/18/10.  Pt had colon in 2005 w/Tubular adenoma, colon in 2008 was clear.  Is pt due for colon?  She also has a family history of colon cancer.  Reports have been scanned into EMR. Francee Piccolo CMA [facility] Dull)  May 15, 2010 9:11 AM   Follow-up for Phone Call        Cancel the colonoscopy she does not need routine colonoscopy until 10/2011 at the earliest per current guidelines evemn with 2 second degree relatives with colon cancer Follow-up by: Iva Boop MD, Clementeen Graham,  May 15, 2010 10:05 AM  Additional Follow-up for Phone Call Additional follow up Details #1::        [facility] number and on cell number. Francee Piccolo CMA [facility] Dull)  May 15, 2010 10:33 AM  pt left message stating that she has decided to have colon on Monday.  I returned call to pt to verify.  Pt is coming to appt on Monday. Additional Follow-up by: Francee Piccolo CMA [facility] Dull),  May 15, 2010 4:50 PM

## 2010-07-07 NOTE — Assessment & Plan Note (Signed)
Summary: concerned of cough, wants chest x-ray/ alc   Vital Signs:  Patient profile:   60 year old female Weight:      174 pounds Temp:     98.9 degrees F oral Pulse rate:   76 / minute Pulse rhythm:   regular BP sitting:   140 / 84  (left arm) Cuff size:   regular  Vitals Entered By: Sydell Axon LPN (July 30, 2009 2:32 PM) CC: Cough for over 2 weeks, productive/yellow   History of Present Illness: Pt here with husband for cough that she did long enough to make her throw up. Shre then had a feeling like a thousand needles coming out of her chest. She had that sensation again today from her abdomen this  She has had temp to 100.  She has had 99.4 since Mon. She went to work on Sat and Sun. She has production Sun and Mon...no ear pain but has congestion, no rhinitis now, mild SOB with exertion. No N/V except from prolonged cough.  Problems Prior to Update: 1)  Rash and Other Nonspecific Skin Eruption  (ICD-782.1) 2)  Uri  (ICD-465.9) 3)  Routine Gynecological Examination  (ICD-V72.31) 4)  Preventive Health Care  (ICD-V70.0) 5)  Bronchitis- Acute  (ICD-466.0) 6)  Other Screening Mammogram  (ICD-V76.12) 7)  Ingrown Nail  (ICD-703.0) 8)  Paronychia, Finger  (ICD-681.02) 9)  Uri  (ICD-465.9) 10)  Chest Pain  (ICD-786.50) 11)  Iron Deficiency  (ICD-280.9) 12)  Cough, Chronic  (ICD-786.2) 13)  Screening For Lipoid Disorders  (ICD-V77.91) 14)  Perimenopausal Syndrome  (ICD-627.9) 15)  Palpitations, Hx of  (ICD-V12.50) 16)  Abdominal Pain, Upper  (ICD-789.09) 17)  Colonic Polyps, Adenomatous, Hx of  (ICD-V12.72) 18)  Hypertension  (ICD-401.9) 19)  Gerd  (ICD-530.81) 20)  Allergic Rhinitis  (ICD-477.9) 21)  Degenerative Disc Disease, Cervical Spine  (ICD-722.4) 22)  Migraine, Common  (ICD-346.10) 23)  Osteoarthritis  (ICD-715.90)  Medications Prior to Update: 1)  Imitrex 50 Mg Tabs (Sumatriptan Succinate) .... Take 1 To 2  Tablet By Mouth Once A Day As Needed Migraine, May  Repeat X 2 Tab  If No Relief in 2 Hours. May 200 Mg in 24 Hours. 2)  Duke's Magic Mouthwash .... Swish and Swallow As Needed 3)  Metoprolol Tartrate 25 Mg Tabs (Metoprolol Tartrate) .Marland Kitchen.. 1 Tab By Mouth Two Times A Day 4)  Lansoprazole 30 Mg Cpdr (Lansoprazole) .Marland Kitchen.. 1 Tab  By Mouth Daily 5)  Calcium 600/vitamin D 600-400 Mg-Unit Chew (Calcium Carbonate-Vitamin D) .Marland Kitchen.. 1 Tab By Mouth Two Times A Day 6)  Ferrous Sulfate 325 (65 Fe) Mg Tabs (Ferrous Sulfate) .... Take 1 Tablet By Mouth Two Times A Day (Sometimes) 7)  Proair Hfa 108 (90 Base) Mcg/act Aers (Albuterol Sulfate) .... 2 Puffs Inhaled Every 6 Hours As Needed Wheeze 8)  Advair  Allergies: 1)  ! Sulfa  Physical Exam  General:  Well-developed,well-nourished,in no acute distress; alert,appropriate and cooperative throughout examination, minimally congested. Head:  Normocephalic and atraumatic without obvious abnormalities. No apparent alopecia or balding. Sinuses NT. Eyes:  Conjunctiva clear bilaterally.  Ears:  External ear exam shows no significant lesions or deformities.  Otoscopic examination reveals clear canals, tympanic membranes are dull to LR with poor mobility, no  inflammation or discharge. Hearing is grossly normal bilaterally. Nose:  clear nasal discharge Mouth:  MMM, minimal thich white PND, no pharyngeal erythema. Neck:  no carotid bruit or thyromegaly  Lungs:  Normal respiratory effort, chest expands symmetrically. Lungs are  clear to auscultation, no crackles but fine very end expiratory wheezes.Marland Kitchen Heart:  Normal rate and regular rhythm. S1 and S2 normal without gallop, murmur, click, rub or other extra sounds. Cervical Nodes:  No lymphadenopathy noted   Impression & Recommendations:  Problem # 1:  BRONCHITIS- ACUTE (ICD-466.0) Assessment Unchanged Continues although sounds better. After of explanations and discussion,. will use Dox again and reiterated further trmt to do. Her updated medication list for this  problem includes:    Proair Hfa 108 (90 Base) Mcg/act Aers (Albuterol sulfate) .Marland Kitchen... 2 puffs inhaled every 6 hours as needed wheeze    Mucinex 600 Mg Xr12h-tab (Guaifenesin) .Marland Kitchen... As needed    Doxycycline Hyclate 100 Mg Caps (Doxycycline hyclate) ..... One tab by mouth two times a day  Complete Medication List: 1)  Imitrex 50 Mg Tabs (Sumatriptan succinate) .... Take 1 to 2  tablet by mouth once a day as needed migraine, may repeat x 2 tab  if no relief in 2 hours. may 200 mg in 24 hours. 2)  Duke's Magic Mouthwash  .... Swish and swallow as needed 3)  Metoprolol Tartrate 25 Mg Tabs (Metoprolol tartrate) .Marland Kitchen.. 1 tab by mouth two times a day 4)  Lansoprazole 30 Mg Cpdr (Lansoprazole) .Marland Kitchen.. 1 tab  by mouth daily 5)  Calcium 600/vitamin D 600-400 Mg-unit Chew (Calcium carbonate-vitamin d) .Marland Kitchen.. 1 tab by mouth two times a day 6)  Ferrous Sulfate 325 (65 Fe) Mg Tabs (Ferrous sulfate) .... Take 1 tablet by mouth two times a day (sometimes) 7)  Proair Hfa 108 (90 Base) Mcg/act Aers (Albuterol sulfate) .... 2 puffs inhaled every 6 hours as needed wheeze 8)  Advair  9)  Zyrtec Allergy 10 Mg Caps (Cetirizine hcl) .... As needed 10)  Mucinex 600 Mg Xr12h-tab (Guaifenesin) .... As needed 11)  Doxycycline Hyclate 100 Mg Caps (Doxycycline hyclate) .... One tab by mouth two times a day  Patient Instructions: 1)  RTC or call if sxs worsen.  2)  Give time. 3)  spent withpt and husband. Prescriptions: DOXYCYCLINE HYCLATE 100 MG CAPS (DOXYCYCLINE HYCLATE) one tab by mouth two times a day  #28 x 0   Entered by:   Melody Comas   Authorized by:   Shaune Leeks MD   Signed by:   Melody Comas on 07/30/2009   Method used:   Electronically to        [facility] [address] [phone]* (retail)       [address] [place], [place]  [address]       Ph: [phone]       Fax: [phone]   RxID:   [id number] DOXYCYCLINE HYCLATE 100 MG CAPS (DOXYCYCLINE HYCLATE) one tab by mouth two times a  day  #28 x 0   Entered and Authorized by:   Shaune Leeks MD   Signed by:   Shaune Leeks MD on 07/30/2009   Method used:   [facility]  [address]       [address]       [place], [place]  [address]       Ph: [phone] or [phone]       Fax: [phone]   RxID:   [id number]  [facility] [place], [facility] are down and no rx's can be done. [facility] did not send rx I sent it from  her computer. DeShannon Smith CMA [facility] Dull)  July 30, 2009 3:40 PM  Current Allergies (reviewed today): ! SULFA

## 2010-07-07 NOTE — Progress Notes (Signed)
Summary: still coughing  Phone Note Call from Patient Call back at [phone]   Caller: Patient Call For: Kerby Nora MD Summary of Call: Pt was seen on 5/25 for sinus symptoms.  She is still coughing and has been unable to work.  She is asking if her sxs warrent her to be out of work for a week, she has not worked since last tuesday.  She tried to go in today but her employer sent her home because of her cough.  She wants to know if you can fill out disability forms.  Also, she has not had script for antibiotic filled and asks if you think she should, states she is coughing up green stuff and has green nasal drainage. Initial call taken by: Lowella Petties CMA,  Nov 04, 2009 8:53 AM  Follow-up for Phone Call        Have her fill antibitoics.. have her bring in forms, but she is not infectious at this point so should be able to go to work if feeling well.  Follow-up by: Kerby Nora MD,  Nov 04, 2009 9:27 AM  Additional Follow-up for Phone Call Additional follow up Details #1::        Patient advised.Consuello Masse CMA  Additional Follow-up by: Benny Lennert CMA [facility] Dull),  Nov 04, 2009 9:39 AM

## 2010-07-07 NOTE — Progress Notes (Signed)
Summary: regarding toprol  Phone Note Call from Patient Call back at [phone]   Caller: Patient Call For: Kerby Nora MD Summary of Call: Pt called to let you know that her toprol script is for metoprolol succinate, 25 mg.  She said she was to call you back with this information. Initial call taken by: Lowella Petties CMA,  July 15, 2009 2:08 PM  Follow-up for Phone Call        Let her know that this should not be cut in half in extended release succinate form.  HAve her change to metoprolol tartrate 25 mg two times a day as below..she can try 1/2 tab by mouth two times a day at first then go to 1 tab by mouth daily if she is hesitant..but 25 two times a day of this should be equivalent to 25 mg daily of the other.  Send into [facility] of choice.  Follow-up by: Kerby Nora MD,  July 15, 2009 2:22 PM  Additional Follow-up for Phone Call Additional follow up Details #1::        Patient advised as instructed.  Rx faxed to [facility]. Additional Follow-up by: Linde Gillis CMA Duncan Dull),  July 15, 2009 2:45 PM    New/Updated Medications: METOPROLOL TARTRATE 25 MG TABS (METOPROLOL TARTRATE) 1 tab by mouth two times a day Prescriptions: METOPROLOL TARTRATE 25 MG TABS (METOPROLOL TARTRATE) 1 tab by mouth two times a day  #60 x 11   Entered by:   Linde Gillis CMA (AAMA)   Authorized by:   Kerby Nora MD   Signed by:   Linde Gillis CMA (AAMA) on 07/15/2009   Method used:   Faxed to ...       [facility] education [facility])       [address] [address]       [place], [place]  [address]       Ph: [phone]       Fax: [phone]   RxID:   [id number]

## 2010-07-07 NOTE — Progress Notes (Signed)
Summary: itchy rash  Phone Note Call from Patient Call back at [phone]   Caller: Patient Summary of Call: Pt was taking albuterol and z- pack and developed an itchy rash.  She stopped the albuterol, thinking that's what was causing it.  Dr[facility] Hetty Ely [facility]  She wanted to let us know that and that [employer] will be sending disability forms to be completed for her being out of work sick. Initial call taken by: Lowella Petties CMA,  July 21, 2009 3:09 PM

## 2010-07-07 NOTE — Assessment & Plan Note (Signed)
Summary: FEEL REALLY BAD--HURTING UNDER RIBS//VGJ   Vital Signs:  Patient profile:   61 year old female Weight:      172 pounds Temp:     98.8 degrees F oral Pulse rate:   72 / minute BP sitting:   142 / 84  (left arm) Cuff size:   large  Vitals Entered By: Alfred Levins, CMA (July 19, 2009 1:55 PM) CC: fever, cough, wheezing x4 days   History of Present Illness: Pt hads been sicjk since Mon seen Tues and put on Zpack and Albuterol. She thinks her fever broke over the night last night and felt she should have felt better quicker.  She no longer has headache, she has had "pinging " in the upper temporal area. Her migraine medicine helped. She has felt sore under her ribs and to her back. Her ears are not hurting, no ST, some nasal running, and she has cough with wheezing. She is minimally SOB, can't take full breath,  no N/V. but had had nausea.  She has taken Zpak, guaifenesin and Albuterol and tyl.  Problems Prior to Update: 1)  Routine Gynecological Examination  (ICD-V72.31) 2)  Preventive Health Care  (ICD-V70.0) 3)  Bronchitis- Acute  (ICD-466.0) 4)  Other Screening Mammogram  (ICD-V76.12) 5)  Ingrown Nail  (ICD-703.0) 6)  Paronychia, Finger  (ICD-681.02) 7)  Uri  (ICD-465.9) 8)  Chest Pain  (ICD-786.50) 9)  Iron Deficiency  (ICD-280.9) 10)  Cough, Chronic  (ICD-786.2) 11)  Screening For Lipoid Disorders  (ICD-V77.91) 12)  Perimenopausal Syndrome  (ICD-627.9) 13)  Palpitations, Hx of  (ICD-V12.50) 14)  Abdominal Pain, Upper  (ICD-789.09) 15)  Colonic Polyps, Adenomatous, Hx of  (ICD-V12.72) 16)  Hypertension  (ICD-401.9) 17)  Gerd  (ICD-530.81) 18)  Allergic Rhinitis  (ICD-477.9) 19)  Degenerative Disc Disease, Cervical Spine  (ICD-722.4) 20)  Migraine, Common  (ICD-346.10) 21)  Osteoarthritis  (ICD-715.90)  Medications Prior to Update: 1)  Imitrex 50 Mg Tabs (Sumatriptan Succinate) .... Take 1 To 2  Tablet By Mouth Once A Day As Needed Migraine, May Repeat X 2 Tab   If No Relief in 2 Hours. May 200 Mg in 24 Hours. 2)  Duke's Magic Mouthwash .... Swish and Swallow As Needed 3)  Metoprolol Tartrate 25 Mg Tabs (Metoprolol Tartrate) .Marland Kitchen.. 1 Tab By Mouth Two Times A Day 4)  Lansoprazole 30 Mg Cpdr (Lansoprazole) .Marland Kitchen.. 1 Tab  By Mouth Daily 5)  Calcium 600/vitamin D 600-400 Mg-Unit Chew (Calcium Carbonate-Vitamin D) .Marland Kitchen.. 1 Tab By Mouth Two Times A Day 6)  Ferrous Sulfate 325 (65 Fe) Mg Tabs (Ferrous Sulfate) .... Take 1 Tablet By Mouth Two Times A Day (Sometimes) 7)  Azithromycin 250 Mg Tabs (Azithromycin) .... 2 Tab By Mouth X 1 Day Then 1 Tab By Mouth Daily 8)  Proair Hfa 108 (90 Base) Mcg/act Aers (Albuterol Sulfate) .... 2 Puffs Inhaled Every 6 Hours As Needed Wheeze  Allergies: 1)  ! Sulfa  Physical Exam  General:  Well-developed,well-nourished,in no acute distress; alert,appropriate and cooperative throughout examination, looks reasonably good with mask on. Head:  Normocephalic and atraumatic without obvious abnormalities. No apparent alopecia or balding. Sinuses minimally tender max. Eyes:  Conjunctiva clear bilaterally.  Ears:  External ear exam shows no significant lesions or deformities.  Otoscopic examination reveals clear canals, tympanic membranes are intact bilaterally without bulging, retraction, inflammation or discharge. Hearing is grossly normal bilaterally. Nose:  clear nasal discharge, no inflammation Mouth:  Oral mucosa and oropharynx without lesions or exudates.  Teeth in good repair. Mucous Membranes moist. Mild thick PND. Neck:  no carotid bruit or thyromegaly no cervical or supraclavicular lymphadenopathy  Chest Wall:  No deformities, masses, or tenderness noted. Lungs:  Normal respiratory effort, chest expands symmetrically. Lungs are clear to auscultation, no crackles or wheezes. Mild end expir wheeze, right sided. Heart:  Normal rate and regular rhythm. S1 and S2 normal without gallop, murmur, click, rub or other extra  sounds.   Impression & Recommendations:  Problem # 1:  URI (ICD-465.9) See instructions.  Complete Medication List: 1)  Imitrex 50 Mg Tabs (Sumatriptan succinate) .... Take 1 to 2  tablet by mouth once a day as needed migraine, may repeat x 2 tab  if no relief in 2 hours. may 200 mg in 24 hours. 2)  Duke's Magic Mouthwash  .... Swish and swallow as needed 3)  Metoprolol Tartrate 25 Mg Tabs (Metoprolol tartrate) .Marland Kitchen.. 1 tab by mouth two times a day 4)  Lansoprazole 30 Mg Cpdr (Lansoprazole) .Marland Kitchen.. 1 tab  by mouth daily 5)  Calcium 600/vitamin D 600-400 Mg-unit Chew (Calcium carbonate-vitamin d) .Marland Kitchen.. 1 tab by mouth two times a day 6)  Ferrous Sulfate 325 (65 Fe) Mg Tabs (Ferrous sulfate) .... Take 1 tablet by mouth two times a day (sometimes) 7)  Azithromycin 250 Mg Tabs (Azithromycin) .... 2 tab by mouth x 1 day then 1 tab by mouth daily 8)  Proair Hfa 108 (90 Base) Mcg/act Aers (Albuterol sulfate) .... 2 puffs inhaled every 6 hours as needed wheeze  Patient Instructions: 1)  Cont Take Guaifenesin by [facility] and getting MUCOUS RELIEF EXPECTORANT (400mg ), take 11/2 tabs by mouth AM and NOON. 2)  Drink lots of fluids anytime taking Guaifenesin.  3)  Take Tyl regularly. 4)  Given Advair 100/50 for the next week. 5)  Call if doesn't cont to improve.

## 2010-07-07 NOTE — Progress Notes (Signed)
Summary: wants referral for colonoscopy  Phone Note Call from Patient Call back at Home Phone [phone] Call back at [phone]   Caller: Patient Call For: Kerby Nora MD Summary of Call: Pt says she is due for colonoscopy.  She said her last one was about 3 years ago, she has a family hx of colon cancer with grand mother and uncle.  She wants to have this at [facility].    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 Initial call taken by: Lowella Petties CMA,  March 25, 2010 10:37 AM  Follow-up for Phone Call        reviewed Dr. Marvell Fuller last colonoscopy..not due for repeat per his recommendations every 5 years so in 2013.  if she does not feel comfortable with his recs..have her contact his office.  Follow-up by: Kerby Nora MD,  March 25, 2010 12:49 PM  Additional Follow-up for Phone Call Additional follow up Details #1::        Patient advised [facility].Consuello Masse CMA   Additional Follow-up by: Benny Lennert CMA [facility] Dull),  March 25, 2010 1:25 PM

## 2010-07-07 NOTE — Assessment & Plan Note (Signed)
Summary: DRAINAGE,COUGH,HA,CONGESTION/CLE   Vital Signs:  Patient profile:   60 year old female Height:      64 inches Weight:      173.8 pounds BMI:     29.94 Temp:     98.2 degrees F oral Pulse rate:   60 / minute Pulse rhythm:   regular BP sitting:   130 / 80  (left arm) Cuff size:   regular  Vitals Entered By: Benny Lennert CMA Duncan Dull) (Oct 29, 2009 10:51 AM)  History of Present Illness: Chief complaint ? sinus infection  Last antibiotic for finger infection in end of April.Marland Kitchendoxycycline.  Using Zyrtec.  no nasal spray ..does do Netty pot.   Acute Visit History:      The patient complains of earache, fever, headache, sinus problems, and sore throat.  These symptoms began 4 days ago.  Other comments include: copius nasal discharge..yellow green. Occ blood tinged. Marland Kitchen        Her highest temperature has been 100.3.  This temperature was recorded Monday.        The earache is located on the right side.        She complains of sinus pressure, ears being blocked, nasal congestion, purulent drainage, and epistaxis.        Allergies: 1)  ! Sulfa  Past History:  Past medical, surgical, family and social histories (including risk factors) reviewed, and no changes noted (except as noted below).  Past Medical History: Reviewed history from 01/05/2009 and no changes required. Allergic rhinitis GERD Hypertension Chronic Headaches Colon Polyps 2005 (adenoma), none 2008  Past Surgical History: Reviewed history from 10/10/2008 and no changes required. Tonsillectomy Partial hysterectomy for prolapse, no cancer, both ovaries remain  Family History: Reviewed history from 12/11/2008 and no changes required. father: CAD MI age 77, HTN, high chol mother: CAD, CABG age 95, HTN, high chol sister: aneurysm in brain age 34 brother: congenital heart defect..? ASD/VSD brothrer: HTN PGM: breast cancer Family History of Colon Cancer:Paternal uncle and grandmother   Social  History: Reviewed history from 10/10/2008 and no changes required. Occupation: Tourist information centre manager Married 3 children: healthy Never Smoked Alcohol use-yes, wine 3 times a week Drug use-no Regular exercise-no Diet: fruits , veggies, fiber, water  Review of Systems ENT:  Denies decreased hearing. CV:  Denies chest pain or discomfort. Resp:  Denies cough, shortness of breath, sputum productive, and wheezing. GI:  Denies abdominal pain.  Physical Exam  General:  Well-developed,well-nourished,in no acute distress; alert,appropriate and cooperative throughout examination Head:  no maxillary  sinus ttp Ears:  clear fluid B TMs Nose:  nasal dischargemucosal pallor.   Mouth:  Oral mucosa and oropharynx without lesions or exudates.  Teeth in good repair. Neck:  no cervical or supraclavicular lymphadenopathy no carotid bruit or thyromegaly  Lungs:  Normal respiratory effort, chest expands symmetrically. Lungs are clear to auscultation, no crackles or wheezes. Heart:  Normal rate and regular rhythm. S1 and S2 normal without gallop, murmur, click, rub or other extra sounds.   Impression & Recommendations:  Problem # 1:  URI (ICD-465.9) N clear bacterial infection. Treat allergies and with mucolytic and sinus irrigation. If not improving treat with antibiotics, but try to avoid given multiple recent antibiotics for other issues.   Problem # 2:  ALLERGIC RHINITIS (ICD-477.9) Assessment: Improved Treat with nasal irrigation, antihistamine and nasal steroid.  Her updated medication list for this problem includes:    Fluticasone Propionate 50 Mcg/act Susp (Fluticasone propionate) .Marland Kitchen... 2 sprays per nostril daily  Complete Medication List: 1)  Imitrex 50 Mg Tabs (Sumatriptan succinate) .... Take 1 to 2  tablet by mouth once a day as needed migraine, may repeat x 2 tab  if no relief in 2 hours. may 200 mg in 24 hours. 2)  Duke's Magic Mouthwash  .... Swish and swallow as needed 3)  Metoprolol Tartrate  25 Mg Tabs (Metoprolol tartrate) .Marland Kitchen.. 1 tab by mouth two times a day 4)  Lansoprazole 30 Mg Cpdr (Lansoprazole) .Marland Kitchen.. 1 tab  by mouth daily 5)  Calcium 600/vitamin D 600-400 Mg-unit Chew (Calcium carbonate-vitamin d) .Marland Kitchen.. 1 tab by mouth two times a day 6)  Ferrous Sulfate 325 (65 Fe) Mg Tabs (Ferrous sulfate) .... Take 1 tablet by mouth two times a day (sometimes) 7)  Proair Hfa 108 (90 Base) Mcg/act Aers (Albuterol sulfate) .... 2 puffs inhaled every 6 hours as needed wheeze 8)  Advair  9)  Fluticasone Propionate 50 Mcg/act Susp (Fluticasone propionate) .... 2 sprays per nostril daily 10)  Amoxicillin 500 Mg Caps (Amoxicillin) .... 2 tab by mouth two times a day x 10 days  fill if not improving in 4-5 days.  Patient Instructions: 1)  Restart netty Pot. 2)   Add nasal steroid spray daily. 3)  Zyrtec at bedtime.  4)  Use guafenesin higher dose two times a day ..no decongestant. 5)  If not turning the corner in 4-5 days or if increasing fever or face pain.Marland Kitchenstart antibiotic.  Prescriptions: AMOXICILLIN 500 MG CAPS (AMOXICILLIN) 2 tab by mouth two times a day x 10 days  Fill if not improving in 4-5 days.  #40 x 0   Entered and Authorized by:   Kerby Nora MD   Signed by:   Kerby Nora MD on 10/29/2009   Method used:   Print then Give to Patient   RxID:   [id number] FLUTICASONE PROPIONATE 50 MCG/ACT SUSP (FLUTICASONE PROPIONATE) 2 sprays per nostril daily  #1 x 0   Entered and Authorized by:   Kerby Nora MD   Signed by:   Kerby Nora MD on 10/29/2009   Method used:   [facility]        [facility]  [address] [facility]       [address]       [place], [place]  [address]       Ph: [phone] or [phone]       Fax: [phone]   RxID:   [id number]   Current Allergies (reviewed today): ! SULFA

## 2010-07-07 NOTE — Letter (Signed)
Summary: Pre Visit Letter Revised  [facility]  [address] [place], [place] [address]   Phone: [phone]  Fax: [phone]        04/24/2010 MRN: [id number] [name] [address] [name]  [address]             Procedure Date:  05/18/2010  Welcome to the Gastroenterology Division at [facility].    You are scheduled to see a nurse for your pre-procedure visit on 05/07/2010 at 11:00AM on the 3rd floor at [facility], [address]  We ask that you try to arrive at our office 15 minutes prior to your appointment time to allow for check-in.  Please take a minute to review the attached form.  If you answer "Yes" to one or more of the questions on the first page, we ask that you call the person listed at your earliest opportunity.  If you answer "No" to all of the questions, please complete the rest of the form and bring it to your appointment.    Your nurse visit will consist of discussing your medical and surgical history, your immediate family medical history, and your medications.   If you are unable to list all of your medications on the form, please bring the medication bottles to your appointment and we will list them.  We will need to be aware of both prescribed and over the counter drugs.  We will need to know exact dosage information as well.    Please be prepared to read and sign documents such as consent forms, a financial agreement, and acknowledgement forms.  If necessary, and with your consent, a friend or relative is welcome to sit-in on the nurse visit with you.  Please bring your insurance card so that we may make a copy of it.  If your insurance requires a referral to see a specialist, please bring your referral form from your primary care physician.  No co-pay is required for this nurse visit.     If you cannot keep your appointment, please call [phone] to cancel or reschedule prior to your appointment date.  This allows [place]  the opportunity to schedule an appointment for another patient in need of care.    Thank you for choosing [facility] for your medical needs.  We appreciate the opportunity to care for you.  Please visit [place] at our website  to learn more about our practice.  Sincerely, The Gastroenterology Division

## 2010-07-07 NOTE — Progress Notes (Signed)
Summary: Follow up colon polyps  Follow up colon polyps   Imported By: Francee Piccolo CMA (AAMA) 05/15/2010 09:06:29  _____________________________________________________________________  External Attachment:    Type:   Image     Comment:   External Document

## 2010-07-07 NOTE — Progress Notes (Signed)
Summary: finger  Phone Note Call from Patient Call back at [phone]   Caller: Patient Call For: Dr. Dayton Martes  Summary of Call: Patient says that she is really concerned that there is a foreign object in her finger because she says that for the most part it looks like the infection is gone other than it sorta looks like there is a blister on the area. She says that it still is very sore and feels like someone is sticking a needle into her finger. She says that she thinks that some of the nail file that she was using when this happened is in there. She wants to [facility] if you think that is a possibility. Please advise.  Initial call taken by: Melody Comas,  September 19, 2009 3:28 PM  Follow-up for Phone Call        I think it is a chronic paronychia.  She needs to follow up with Dr. Ermalene Searing next week to have it evaluated if it still hurts. Follow-up by: Ruthe Mannan MD,  September 19, 2009 3:29 PM  Additional Follow-up for Phone Call Additional follow up Details #1::        Tried to call patient no answer. Will call back later.  Mycah Mcdougall  September 19, 2009 4:02 PM   Called patient again, still no answer.  Regana Kemple  September 19, 2009 5:01 PM   Spoke with patient and she says that her finger is still very sore and the only day she could come this week would be thursday because that is her day off. Dr. Ermalene Searing is not here that day, so I put her with you. Is that okay? Cordia Miklos  September 22, 2009 9:00 AM      Additional Follow-up for Phone Call Additional follow up Details #2::    ok, but please make a 30 min appt. Follow-up by: Ruthe Mannan MD,  September 22, 2009 9:02 AM  Additional Follow-up for Phone Call Additional follow up Details #3:: Details for Additional Follow-up Action Taken: 30 min appt. made. Additional Follow-up by: Melody Comas,  September 22, 2009 9:05 AM

## 2010-07-07 NOTE — Progress Notes (Signed)
Summary: Update  Phone Note Call from Patient Call back at Home Phone [phone] Call back at [phone]   Caller: Patient Call For: Dr. Hetty Ely Summary of Call: pt states she does remember going into a hospital in Nov, her husband's uncle was dying in the hospital. They stayed with the pt in the hospital and touched and hugged him. She just wanted to let you know. Initial call taken by: Mervin Hack CMA Duncan Dull),  June 10, 2009 4:48 PM  Follow-up for Phone Call        Noted. Follow-up by: Shaune Leeks MD,  June 10, 2009 4:56 PM

## 2010-07-07 NOTE — Progress Notes (Signed)
  Phone Note Other Incoming   Request: Send information Summary of Call: Received records from [facility], 8 pages sent up to Dr. Leone Payor.     Appended Document:  Records received from [facility]. 4 pages forwarded to Dr. Leone Payor for review. Stephanie aware.

## 2010-07-07 NOTE — Progress Notes (Signed)
Summary: Infection in finger  Phone Note Call from Patient Call back at Home Phone [phone]   Caller: Patient Call For: Dr. Dayton Martes  Complaint: Urinary/GYN Problems Summary of Call: Patient calls to let Dr. Dayton Martes know that her finger is still giving her problems.  She says that there is still "something" in her finger.  After digging in it with a needle and pulling something out that looked like a piece of hair, she is convinced that there is more still in her finger.  She says that there is a blister coming up on the side of her finger like before and it is still swelling and painful.  She is still soaking it but wants to know if Dr. Dayton Martes or Dr. Patsy Lager is willing to "cut" into her finger to see if they can get anything else out of her finger.  Advised that both physicians will be out of the office after today until Monday.  Please advise.   Initial call taken by: Linde Gillis CMA Duncan Dull),  Oct 16, 2009 11:12 AM  Follow-up for Phone Call        She saw one of Dr. Dorita Sciara partners a couple of weeks ago. Has had several I and D's in our office  At this point, I would rather have her f/u with the dermatologist that she recently saw. Follow-up by: Hannah Beat MD,  Oct 16, 2009 11:18 AM  Additional Follow-up for Phone Call Additional follow up Details #1::        Patient advised as instructed.  She says that she really does not want to go back to the Dermatologist because they want to take off her entire fingernail and she says the problem is not her fingernail, but beside her fingernail.  She says that she feels that the Dermatologist does not even listen to her concerns about this issue.  She has been dealing with this for some time now and wants to get this resolved.  Please advise. Additional Follow-up by: Linde Gillis CMA Duncan Dull),  Oct 16, 2009 11:26 AM    Additional Follow-up for Phone Call Additional follow up Details #2::    will discuss with Jacki Cones right now Follow-up by: Hannah Beat MD,  Oct 16, 2009 11:33 AM  Additional Follow-up for Phone Call Additional follow up Details #3:: Details for Additional Follow-up Action Taken: Advised pt that she will need to see a dermatologist for this problem.  Explained to her that Dr. Patsy Lager would have to do extensive cutting and she would be much better off going to a dermatologist because this is a dermatological problem.  Pt agreed. Additional Follow-up by: Lowella Petties CMA,  Oct 16, 2009 12:11 PM

## 2010-07-09 NOTE — Procedures (Signed)
Summary: Colonoscopy  Patient: [name] Note: All result statuses are Final unless otherwise noted.  Tests: (1) Colonoscopy (COL)   COL Colonoscopy           DONE     [facility]     [address]     [place], [place]  [address]           COLONOSCOPY PROCEDURE REPORT           PATIENT:  [name], [name]  MR#:  [id number]     BIRTHDATE:  1950/07/29, 59 yrs. old  GENDER:  female     ENDOSCOPIST:  Iva Boop, MD, [facility]           PROCEDURE DATE:  05/18/2010     PROCEDURE:  Colonoscopy [address]     ASA CLASS:  Class II     INDICATIONS:  surveillance and high-risk screening, history of     pre-cancerous (adenomatous) colon polyps adenoma in 2005     none in 2008     family hx colon cancer in two second-degree relatives( an     elderly grandparent and an aunt)     MEDICATIONS:   Fentanyl 100 mcg IV, Versed 10 mg           DESCRIPTION OF PROCEDURE:   After the risks benefits and     alternatives of the procedure were thoroughly explained, informed     consent was obtained.  Digital rectal exam was performed and     revealed no abnormalities.   The LB [id number]-[id number] endoscope     was introduced through the anus and advanced to the cecum, which     was identified by both the appendix and ileocecal valve, without     limitations.  The quality of the prep was excellent, using     MoviPrep.  The instrument was then slowly withdrawn as the colon     was fully examined. Insertion: 6:16 minutes Withdrawal: 7:33     minutes     <<PROCEDUREIMAGES>>           FINDINGS:  A normal appearing cecum, ileocecal valve, and     appendiceal orifice were identified. The ascending, hepatic     flexure, transverse, splenic flexure, descending, sigmoid colon,     and rectum appeared unremarkable.   Retroflexed views in the right     colon and  rectum revealed no abnormalities.    The scope was then     withdrawn from the patient and the procedure completed.           COMPLICATIONS:   None     ENDOSCOPIC IMPRESSION:     1) Normal colonoscopy with excellent prep     2) Prior removal of 4 mm adenoma (2005)     3) Family history of colon cancer in 2 second-degree relatives     (one was elderly)           REPEAT EXAM:  In 5 year(s) for routine screening colonoscopy.           Iva Boop, MD, [facility]           CC:  The Patient           n.     eSIGNED:   Iva Boop at 05/18/2010 03:23 PM           Erma Heritage, [id number]  Note: An exclamation mark (!) indicates a result that was not dispersed  into the flowsheet. Document Creation Date: 05/18/2010 3:23 PM _______________________________________________________________________  (1) Order result status: Final Collection or observation date-time: 05/18/2010 15:09 Requested date-time:  Receipt date-time:  Reported date-time:  Referring Physician:   Ordering Physician: Stan Head [phone]) Specimen Source:  Source: Launa Grill Order Number: [id number] Lab site:   Appended Document: Colonoscopy    Clinical Lists Changes  Observations: Added new observation of COLONNXTDUE: 05/2015 (05/18/2010 15:40)

## 2010-07-09 NOTE — Assessment & Plan Note (Signed)
Summary: bronchitis/hmw   Vital Signs:  Patient profile:   60 year old female Weight:      173.25 pounds O2 Sat:      94 % on Room air Temp:     98.3 degrees F oral Pulse rate:   54 / minute Pulse rhythm:   regular BP sitting:   140 / 90  (left arm) Cuff size:   large  Vitals Entered By: Selena Batten Dance CMA (AAMA) (June 16, 2010 11:24 AM)  O2 Flow:  Room air CC: Recheck bronchitis/ ? blood in sputum   History of Present Illness: CC: recheck bronchitis  seen last week and dx with bronchitis.  Here for recheck because still coughing/wheezing, tiny amt blood in mucous.  fevers/chills gone.  Has been out of work since wednesday 06/10/2010.  on day 4 doxy.  still feels wheezy.  taking zyrtec at night and mucinex in am.  No HA, ST, abd pain.  grandson born over weekend so unable to rest well, taking care of granddaughter.    No smokers at home.  no h/o COPD or asthma.  + h/o allergies.  used inhaler last year.  Works at call center.  on phone alot and starts coughing easily.  Last year had bronchitis for 5 weeks.  previously had bronchitis and had to be out of work 4 weeks.  both times because she overdid it returning to work too soon.  if to stay out longer, will need to file for STD.  Current Medications (verified): 1)  Imitrex 50 Mg Tabs (Sumatriptan Succinate) .... Take 1 To 2  Tablet By Mouth Once A Day As Needed Migraine, May Repeat X 2 Tab  If No Relief in 2 Hours. May 200 Mg in 24 Hours. 2)  Duke's Magic Mouthwash .... Swish and Swallow As Needed 3)  Metoprolol Tartrate 25 Mg Tabs (Metoprolol Tartrate) .Marland Kitchen.. 1 Tab By Mouth Two Times A Day 4)  Lansoprazole 30 Mg Cpdr (Lansoprazole) .Marland Kitchen.. 1 Tab  By Mouth Daily 5)  Calcium 600/vitamin D 600-400 Mg-Unit Chew (Calcium Carbonate-Vitamin D) .Marland Kitchen.. 1 Tab By Mouth Two Times A Day 6)  Advair 7)  Doxycycline Hyclate 100 Mg Caps (Doxycycline Hyclate) .... One Tab By Mouth Two Times A Day  Allergies: 1)  ! Sulfa 2)  ! 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 Head:  Normocephalic and atraumatic without obvious abnormalities. Sinuses NT. Eyes:  Conjunctiva clear bilaterally.  Ears:  TMs clear bilaterally.  some fluid behind L TM Nose:  nasal discharge clear with normal mucosa.   Mouth:  Oral mucosa and oropharynx without lesions or exudates.  Teeth in good repair. Neck:  no cervical or supraclavicular lymphadenopathy no carotid bruit or thyromegaly  Lungs:  evident end expiratory wheezing throughout.  normal wob.  good air movement  pulse ox 94%RA Heart:  Normal rate and regular rhythm. S1 and S2 normal without gallop, murmur, click, rub or other extra sounds. Pulses:  2+ rad pulses, brisk cap refill Extremities:  no pedal edema   Impression &  Recommendations:  Problem # 1:  BRONCHITIS- ACUTE (ICD-466.0) currently on doxy.  Still wheezy, mild hypoxia to 94%.  No h/o asthma or COPD or smoking, however has had RAD with bronchitis in past.  per patient, h/o bad bronchitis in past, strongly considering asking for short leave to prevent current deterioration.  discussed natural progression of illness, would expect her to be feelign better by end of this week or early next week, cough can last weeks.  treat with albuterol scheduled net 1-2 days for RAD, add steroid if not better by 1-2 days ([facility]ASP).  return friday for recheck, likely able to return to work then.  Her  updated medication list for this problem includes:    Doxycycline Hyclate 100 Mg Caps (Doxycycline hyclate) ..... One tab by mouth two times a day    Ventolin Hfa 108 (90 Base) Mcg/act Aers (Albuterol sulfate) .Marland Kitchen... 2 puffs q6 hours as needed cough  Complete Medication List: 1)  Imitrex 50 Mg Tabs (Sumatriptan succinate) .... Take 1 to 2  tablet by mouth once a day as needed migraine, may repeat x 2 tab  if no relief in 2 hours. may 200 mg in 24 hours. 2)  Duke's Magic Mouthwash  .... Swish and swallow as needed 3)  Metoprolol Tartrate 25 Mg Tabs (Metoprolol tartrate) .Marland Kitchen.. 1 tab by mouth two times a day 4)  Lansoprazole 30 Mg Cpdr (Lansoprazole) .Marland Kitchen.. 1 tab  by mouth daily 5)  Calcium 600/vitamin D 600-400 Mg-unit Chew (Calcium carbonate-vitamin d) .Marland Kitchen.. 1 tab by mouth two times a day 6)  Advair  7)  Doxycycline Hyclate 100 Mg Caps (Doxycycline hyclate) .... One tab by mouth two times a day 8)  Ventolin Hfa 108 (90 Base) Mcg/act Aers (Albuterol sulfate) .... 2 puffs q6 hours as needed cough 9)  Prednisone 20 Mg Tabs (Prednisone) .... 2 daily for 7 days  Patient Instructions: 1)  Return Friday for follow up. 2)  Sounds like continued bronchitis.  add on albuterol.  if not improving, call us for prednisone course. 3)  fax request of short term disability to [place] and we will fill out.  4)  I'd expect you to be feeling good enough to return to work next week. 5)  good to meet you today, call clinic with questions.   Prescriptions: PREDNISONE 20 MG TABS (PREDNISONE) 2 daily for 7 days  #14 x 0   Entered and Authorized by:   Eustaquio Boyden  MD   Signed by:   Eustaquio Boyden  MD on 06/16/2010   Method used:   Print then Give to Patient   RxID:   [id number] VENTOLIN HFA 108 (90 BASE) MCG/ACT AERS (ALBUTEROL SULFATE) 2 puffs q6 hours as needed cough  #1 x 1   Entered and Authorized by:   Eustaquio Boyden  MD   Signed by:   Eustaquio Boyden  MD on 06/16/2010   Method used:   [facility]  [address] [facility]       [address]       [place], [place]  [address]       Ph: [phone] or [phone]       Fax: [phone]   RxID:   [id number]    Orders Added: 1)  Est. Patient Level III [20254]    Current Allergies (reviewed today): ! SULFA ! ZITHROMAX Z-PAK (AZITHROMYCIN)

## 2010-07-09 NOTE — Progress Notes (Signed)
Summary: Question about her symptoms  Phone Note Call from Patient   Caller: Patient Reason for Call: Talk to Nurse Details for Reason: Questions about symptoms Summary of Call: Pt states that phlegm is thicker, yellowish/green and blood tinged.  She had the prescription filled on Saturday and she's been taking it since.  Fever is down but still weezing and tastes a little bit of blood but says it has gotten better.  She does not feel comfortable going back to work and is asking about the blood in phlegm.  She is also asking how long will bronchitis last and should she continue to stay out of work and how long should she stay out of work?  Please call her at [phone]. Initial call taken by: Clarisa Schools,  June 15, 2010 1:14 PM  Follow-up for Phone Call        recheck tomorrow.  if clinically worsening, blood in sputum, would recheck physically. Follow-up by: Hannah Beat MD,  June 15, 2010 2:22 PM  Additional Follow-up for Phone Call Additional follow up Details #1::        Patient has appt tomorrow with dr. g Additional Follow-up by: Benny Lennert CMA Duncan Dull),  June 15, 2010 2:34 PM

## 2010-07-09 NOTE — Assessment & Plan Note (Signed)
Summary: FOLLOW UP WITH BRONCHITIS   Vital Signs:  Patient profile:   60 year old female Height:      64 inches Weight:      176.75 pounds BMI:     30.45 O2 Sat:      96 % on Room air Temp:     98.7 degrees F oral Pulse rate:   60 / minute Pulse rhythm:   regular BP sitting:   140 / 88  (left arm) Cuff size:   large  Vitals Entered By: Benny Lennert CMA Duncan Dull) (June 23, 2010 9:54 AM)  O2 Flow:  Room air  History of Present Illness: Chief complaint follow up bronchitis  1/5 2010.. seen by Dr. Hetty Ely.. treated with doxy and proair as needed.  Seen 1/10 and 1/13 by Dr. Reece Agar.. presumed RAD ..given slow improvement.. treated with steroid course.  Recommended PFTs when well in next 1-2 months.  She as hx of severe bronchitis.Marland Kitchen out of work for 5 weeks...Marland Kitchenat last OV.. plan to return to work was yesterday.Erma Heritage does not feels she is ready for return.   She completed prednisone and antibitoic yesterday...has not been wheezing , was not sleeping as well on prednisone. Still some cough, productive.  Mild  SOB when talking. Hoarse by end of the day.  All in last year.. 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  Peak flow today 310.      Problems Prior to Update: 1)  Bronchitis- Acute  (ICD-466.0) 2)  Allergic Rhinitis  (ICD-477.9) 3)  Fatigue  (ICD-780.79) 4)  Paronychia, Finger  (ICD-681.02) 5)  Mammogram, Abnormal, Left  (ICD-793.80) 6)  Routine Gynecological Examination  (ICD-V72.31) 7)  Preventive Health Care  (ICD-V70.0) 8)  Other Screening Mammogram  (ICD-V76.12) 9)  Iron Deficiency  (ICD-280.9) 10)  Screening For Lipoid Disorders  (ICD-V77.91) 11)  Perimenopausal Syndrome  (ICD-627.9) 12)  Palpitations, Hx of  (ICD-V12.50) 13)  Colonic Polyps, Adenomatous, Hx of  (ICD-V12.72) 14)  Hypertension  (ICD-401.9) 15)  Gerd  (ICD-530.81) 16)  Allergic Rhinitis  (ICD-477.9) 17)  Degenerative Disc Disease, Cervical Spine   (ICD-722.4) 18)  Migraine, Common  (ICD-346.10) 19)  Osteoarthritis  (ICD-715.90)  Current Medications (verified): 1)  Imitrex 50 Mg Tabs (Sumatriptan Succinate) .... Take 1 To 2  Tablet By Mouth Once A Day As Needed Migraine, May Repeat X 2 Tab  If No Relief in 2 Hours. May 200 Mg in 24 Hours. 2)  Metoprolol Tartrate 25 Mg Tabs (Metoprolol Tartrate) .Marland Kitchen.. 1 Tab By Mouth Two Times A Day 3)  Lansoprazole 30 Mg Cpdr (Lansoprazole) .Marland Kitchen.. 1 Tab  By Mouth Daily 4)  Calcium 600/vitamin D 600-400 Mg-Unit Chew (Calcium Carbonate-Vitamin D) .Marland Kitchen.. 1 Tab By Mouth Two Times A Day 5)  Advair 6)  Ventolin Hfa 108 (90 Base) Mcg/act Aers (Albuterol Sulfate) .... 2 Puffs Q6 Hours As Needed Cough 7)  Mucinex 600 Mg Xr12h-Tab (Guaifenesin) .... One Twice Daily With Fluid  Allergies: 1)  ! Sulfa 2)  ! Zithromax Z-Pak (Azithromycin)  Past History:  Past medical, surgical, family and social histories (including risk factors) reviewed, and no changes noted (except as noted below).  Past Medical History: Reviewed history from 06/19/2010 and no changes required. Allergic rhinitis GERD Hypertension Chronic Headaches Colon Polyps 2005 (adenoma), none 2008 h/o recurrent bronchitis  Past Surgical History: Reviewed history from 10/10/2008 and no changes required. Tonsillectomy Partial hysterectomy for prolapse, no cancer, both ovaries remain  Family History: Reviewed history from 12/11/2008  and no changes required. father: CAD MI age 16, HTN, high chol mother: CAD, CABG age 39, HTN, high chol sister: aneurysm in brain age 64 brother: congenital heart defect..? ASD/VSD brothrer: HTN PGM: breast cancer Family History of Colon Cancer:Paternal uncle and grandmother   Social History: Reviewed history from 10/10/2008 and no changes required. 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  Otoscopic examination reveals clear canals, tympanic membranes are intact bilaterally without bulging, retraction, inflammation or discharge. Hearing is grossly normal bilaterally. Nose:  External nasal examination shows no deformity or inflammation. Nasal mucosa are pink and moist without lesions or exudates. Mouth:  Oral mucosa and oropharynx without lesions or exudates.  Teeth in good repair. Neck:  no carotid bruit or thyromegaly no cervical or supraclavicular lymphadenopathy  Lungs:  Normal respiratory effort, chest expands symmetrically. Lungs are clear to auscultation, no crackles or wheezes. Heart:  Normal rate and regular rhythm. S1 and S2 normal without gallop, murmur, click, rub or other extra sounds. Pulses:  2+ rad pulses, brisk cap refill Extremities:  no pedal edema   Impression & Recommendations:  Problem # 1:  BRONCHITIS- ACUTE (ICD-466.0) No current wheezing..  continued slow improvement...able to return to work.. start parttime given on phone all day and hoarsness and mild SOB. Progress to full time return on 1/20.  Given some mild continued symtpms.. will start advair temporarily as last year. Will need follow up as below.  The following medications were removed from the medication list:    Doxycycline Hyclate 100 Mg Caps (Doxycycline hyclate) ..... One tab by mouth two times a day Her updated medication list for this problem includes:    Ventolin Hfa 108 (90 Base) Mcg/act Aers (Albuterol sulfate) .Marland Kitchen... 2 puffs q6 hours as needed cough    Mucinex 600 Mg Xr12h-tab (Guaifenesin) ..... One twice daily with fluid    Advair Diskus 250-50 Mcg/dose Aepb (Fluticasone-salmeterol) .Marland Kitchen... 1 inh two times a day x 1-2 weeks  Problem # 2:  ? of ASTHMA, UNSPECIFIED, UNSPECIFIED  STATUS (ICD-493.90) Return for PFTs pre and post albuterol in 1 month to determine if longterm controller med  such as advair/singulair etc needed.  The following medications were removed from the medication list:    Prednisone 20 Mg Tabs (Prednisone) .Marland Kitchen... 2 daily for 7 days Her updated medication list for this problem includes:    Ventolin Hfa 108 (90 Base) Mcg/act Aers (Albuterol sulfate) .Marland Kitchen... 2 puffs q6 hours as needed cough    Advair Diskus 250-50 Mcg/dose Aepb (Fluticasone-salmeterol) .Marland Kitchen... 1 inh two times a day x 1-2 weeks  Complete Medication List: 1)  Imitrex 50 Mg Tabs (Sumatriptan succinate) .... Take 1 to 2  tablet by mouth once a day as needed migraine, may repeat x 2 tab  if no relief in 2 hours. may 200 mg in 24 hours. 2)  Metoprolol Tartrate 25 Mg Tabs (Metoprolol tartrate) .Marland Kitchen.. 1 tab by mouth two times a day 3)  Lansoprazole 30 Mg Cpdr (Lansoprazole) .Marland Kitchen.. 1 tab  by mouth daily 4)  Calcium 600/vitamin D 600-400 Mg-unit Chew (Calcium carbonate-vitamin d) .Marland Kitchen.. 1 tab by mouth two times a day 5)  Advair  6)  Ventolin Hfa 108 (90 Base) Mcg/act Aers (Albuterol sulfate) .... 2  puffs q6 hours as needed cough 7)  Mucinex 600 Mg Xr12h-tab (Guaifenesin) .... One twice daily with fluid 8)  Advair Diskus 250-50 Mcg/dose Aepb (Fluticasone-salmeterol) .Marland Kitchen.. 1 inh two times a day x 1-2 weeks  Patient Instructions: 1)  Start advair 250/50 two times a day.. use for 1-2 weeks then stop. 2)  Schedule appt with me for PFTs in 1 month.  3)   Return to work for 4 hours a day... for next 2 days then return full time on 06/26/2010.  Prescriptions: ADVAIR DISKUS 250-50 MCG/DOSE AEPB (FLUTICASONE-SALMETEROL) 1 inh two times a day x 1-2 weeks  #1 x 0   Entered and Authorized by:   Kerby Nora MD   Signed by:   Kerby Nora MD on 06/23/2010   Method used:   Electronically to        [facility] [id number] (retail)       [address]       [place], [place]  [address]       Ph: [phone] or  [phone]       Fax: [phone]   RxID:   [id number]    Orders Added: 1)  Est. Patient Level III [34742]    Current Allergies (reviewed today): ! SULFA ! ZITHROMAX Z-PAK (AZITHROMYCIN)

## 2010-07-09 NOTE — Assessment & Plan Note (Signed)
Summary: COUGH,WHEEZING/CLE   Vital Signs:  Patient profile:   60 year old female Weight:      174.25 pounds O2 Sat:      95 % on Room air Temp:     99.7 degrees F oral Pulse rate:   60 / minute Pulse rhythm:   regular BP sitting:   126 / 80  (left arm) Cuff size:   large  Vitals Entered By: Sydell Axon LPN (June 11, 2010 10:39 AM)  O2 Flow:  Room air CC: Productive coug/clear, headache and wheezing   History of Present Illness: Pt of Dr Daphine Deutscher here for acute visit for coughing and wheezing. She started coughing on Tues (2 dayts ago) and started wheezing yesterday. She had a bad time last year and wanted to be seen quickly. She has had fever of 99+ today no chills. She has no headache, no ear pain, no rhinitis although mild discomfort in the nose as of yesterday, some wateriiness of eyes, no sneezing, no ST altho scratchy yesterday. She has been very sleepy at work, she is coughing a lot, now mildly productive that looks clear. She is not achey, just sleepy.  She had a significant reaction to Zithromax the second time she took it last year with rash and itching.  Problems Prior to Update: 1)  Allergic Rhinitis  (ICD-477.9) 2)  Uri  (ICD-465.9) 3)  Fatigue  (ICD-780.79) 4)  Paronychia, Finger  (ICD-681.02) 5)  Mammogram, Abnormal, Left  (ICD-793.80) 6)  Routine Gynecological Examination  (ICD-V72.31) 7)  Preventive Health Care  (ICD-V70.0) 8)  Other Screening Mammogram  (ICD-V76.12) 9)  Iron Deficiency  (ICD-280.9) 10)  Screening For Lipoid Disorders  (ICD-V77.91) 11)  Perimenopausal Syndrome  (ICD-627.9) 12)  Palpitations, Hx of  (ICD-V12.50) 13)  Colonic Polyps, Adenomatous, Hx of  (ICD-V12.72) 14)  Hypertension  (ICD-401.9) 15)  Gerd  (ICD-530.81) 16)  Allergic Rhinitis  (ICD-477.9) 17)  Degenerative Disc Disease, Cervical Spine  (ICD-722.4) 18)  Migraine, Common  (ICD-346.10) 19)  Osteoarthritis  (ICD-715.90)  Medications Prior to Update: 1)  Imitrex 50 Mg Tabs  (Sumatriptan Succinate) .... Take 1 To 2  Tablet By Mouth Once A Day As Needed Migraine, May Repeat X 2 Tab  If No Relief in 2 Hours. May 200 Mg in 24 Hours. 2)  Duke's Magic Mouthwash .... Swish and Swallow As Needed 3)  Metoprolol Tartrate 25 Mg Tabs (Metoprolol Tartrate) .Marland Kitchen.. 1 Tab By Mouth Two Times A Day 4)  Lansoprazole 30 Mg Cpdr (Lansoprazole) .Marland Kitchen.. 1 Tab  By Mouth Daily 5)  Calcium 600/vitamin D 600-400 Mg-Unit Chew (Calcium Carbonate-Vitamin D) .Marland Kitchen.. 1 Tab By Mouth Two Times A Day 6)  Ferrous Sulfate 325 (65 Fe) Mg Tabs (Ferrous Sulfate) .... Take 1 Tablet By Mouth Two Times A Day (Sometimes) 7)  Proair Hfa 108 (90 Base) Mcg/act Aers (Albuterol Sulfate) .... 2 Puffs Inhaled Every 6 Hours As Needed Wheeze 8)  Advair 9)  Fluticasone Propionate 50 Mcg/act Susp (Fluticasone Propionate) .... 2 Sprays Per Nostril Daily 10)  Amoxicillin 500 Mg Caps (Amoxicillin) .... 2 Tab By Mouth Two Times A Day X 10 Days  Fill If Not Improving in 4-5 Days.  Allergies: 1)  ! Sulfa 2)  ! Zithromax Z-Pak (Azithromycin)  Physical Exam  General:  Well-developed,well-nourished,in no acute distress; alert,appropriate and cooperative throughout examination, minimally congested. Head:  Normocephalic and atraumatic without obvious abnormalities. Sinuses NT. Eyes:  Conjunctiva clear bilaterally.  Ears:  External ear exam shows no significant lesions  or deformities.  Otoscopic examination reveals clear canals, tympanic membranes are intact bilaterally without bulging, retraction, inflammation or discharge. Hearing is grossly normal bilaterally. Nose:  nasal discharge clear with normal mucosa.   Mouth:  Oral mucosa and oropharynx without lesions or exudates.  Teeth in good repair. Neck:  no cervical or supraclavicular lymphadenopathy no carotid bruit or thyromegaly  Lungs:  Normal respiratory effort, chest expands symmetrically. Lungs are clear to auscultation, no crackles or wheezes. Heart:  Normal rate and regular  rhythm. S1 and S2 normal without gallop, murmur, click, rub or other extra sounds.   Impression & Recommendations:  Problem # 1:  BRONCHITIS- ACUTE (ICD-466.0) Assessment New See instructions. If worsens by weekend or later, take Doxy. The following medications were removed from the medication list:    Proair Hfa 108 (90 Base) Mcg/act Aers (Albuterol sulfate) .Marland Kitchen... 2 puffs inhaled every 6 hours as needed wheeze    Amoxicillin 500 Mg Caps (Amoxicillin) .Marland Kitchen... 2 tab by mouth two times a day x 10 days  fill if not improving in 4-5 days. Her updated medication list for this problem includes:    Doxycycline Hyclate 100 Mg Caps (Doxycycline hyclate) ..... One tab by mouth two times a day  Complete Medication List: 1)  Imitrex 50 Mg Tabs (Sumatriptan succinate) .... Take 1 to 2  tablet by mouth once a day as needed migraine, may repeat x 2 tab  if no relief in 2 hours. may 200 mg in 24 hours. 2)  Duke's Magic Mouthwash  .... Swish and swallow as needed 3)  Metoprolol Tartrate 25 Mg Tabs (Metoprolol tartrate) .Marland Kitchen.. 1 tab by mouth two times a day 4)  Lansoprazole 30 Mg Cpdr (Lansoprazole) .Marland Kitchen.. 1 tab  by mouth daily 5)  Calcium 600/vitamin D 600-400 Mg-unit Chew (Calcium carbonate-vitamin d) .Marland Kitchen.. 1 tab by mouth two times a day 6)  Ferrous Sulfate 325 (65 Fe) Mg Tabs (Ferrous sulfate) .... Take 1 tablet by mouth two times a day (sometimes) 7)  Advair  8)  Doxycycline Hyclate 100 Mg Caps (Doxycycline hyclate) .... One tab by mouth two times a day  Patient Instructions: 1)  Take Cetirizine (Zyrtec) 10mg  at night 2)  Take Guaifenesin by [facility] or [facility] and getting MUCOUS RELIEF EXPECTORANT (400mg ), take 11/2 tabs by mouth AM and NOON. 3)  Drink lots of fluids anytime taking Guaifenesin.  4)  Take Tyl regularly, ES (500mg ) 2 tabs three times a day. 5)  If worsens, take Zithromax. Prescriptions: DOXYCYCLINE HYCLATE 100 MG CAPS (DOXYCYCLINE HYCLATE) one tab by mouth two times  a day  #20 x 0   Entered and Authorized by:   Shaune Leeks MD   Signed by:   Shaune Leeks MD on 06/11/2010   Method used:   Print then Give to Patient   RxID:   [id number]    Orders Added: 1)  Est. Patient Level III [56213]    Current Allergies (reviewed today): ! SULFA ! ZITHROMAX Z-PAK (AZITHROMYCIN)

## 2010-07-09 NOTE — Assessment & Plan Note (Signed)
Summary: FRIDAY FOLLOW UP / LFW   Vital Signs:  Patient profile:   60 year old female Weight:      173.25 pounds O2 Sat:      95 % on Room air Temp:     98.8 degrees F oral Pulse rate:   68 / minute Pulse rhythm:   regular BP sitting:   142 / 84  (left arm) Cuff size:   large  Vitals Entered By: Selena Batten Dance CMA (AAMA) (June 19, 2010 10:17 AM)  O2 Flow:  Room air CC: Recheck   History of Present Illness: CC: recheck bronchitis  seen 3d ago, with dx bronchitis, found to be wheezing and mild hypoxia to 94% RA.  Dx asthmatic bronchitis (despite no h/o smoking or asthma or COPD) and treated with doxy, albuterol and prednisone.   returns today.  started steroid course 06/16/10.  finishing doxy course.  yesterday started feeling worse.  worse wheezing when laying down.  Feels tired.  (did miss 1 day of albuterol use, thought could stop when started steroids).  has restarted albuterol and feeling so-so today (wheezy).  No fevers.  temp to 99.3 yesterday.  No nausea/vomiting.  Still productive of phlegm.  using mucinex twice daily.  Newborn grandson doing well.  Current Medications (verified): 1)  Imitrex 50 Mg Tabs (Sumatriptan Succinate) .... Take 1 To 2  Tablet By Mouth Once A Day As Needed Migraine, May Repeat X 2 Tab  If No Relief in 2 Hours. May 200 Mg in 24 Hours. 2)  Duke's Magic Mouthwash .... Swish and Swallow As Needed 3)  Metoprolol Tartrate 25 Mg Tabs (Metoprolol Tartrate) .Marland Kitchen.. 1 Tab By Mouth Two Times A Day 4)  Lansoprazole 30 Mg Cpdr (Lansoprazole) .Marland Kitchen.. 1 Tab  By Mouth Daily 5)  Calcium 600/vitamin D 600-400 Mg-Unit Chew (Calcium Carbonate-Vitamin D) .Marland Kitchen.. 1 Tab By Mouth Two Times A Day 6)  Advair 7)  Doxycycline Hyclate 100 Mg Caps (Doxycycline Hyclate) .... One Tab By Mouth Two Times A Day 8)  Ventolin Hfa 108 (90 Base) Mcg/act Aers (Albuterol Sulfate) .... 2 Puffs Q6 Hours As Needed Cough 9)  Prednisone 20 Mg Tabs (Prednisone) .... 2 Daily For 7 Days 10)  Mucinex 600  Mg Xr12h-Tab (Guaifenesin) .... One Twice Daily With Fluid  Allergies: 1)  ! Sulfa 2)  ! 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 Head:  Normocephalic and atraumatic without obvious abnormalities. Sinuses NT. Eyes:  Conjunctiva clear bilaterally.  Ears:  TMs clear bilaterally.  some fluid behind L TM Nose:  nasal discharge clear with normal mucosa.   Mouth:  Oral mucosa and oropharynx without lesions or exudates.  Teeth in good repair. Neck:  no cervical or supraclavicular lymphadenopathy no carotid bruit or thyromegaly  Lungs:  evident diffuse end expiratory wheezing throughout with cough.  normal wob.  pulse ox 95%RA  after albuterol/atrovent neb - improved air movement.  decreased exp wheezing but still present Heart:  Normal rate and regular rhythm. S1 and S2 normal without gallop, murmur, click, rub or  other extra sounds. Abdomen:  Bowel sounds positive,abdomen soft and non-tender without masses, organomegaly or hernias noted.   Impression & Recommendations:  Problem # 1:  BRONCHITIS- ACUTE (ICD-466.0) Assessment Improved presumed asthmatic bronchitis.  treated aggressively with doxycycline, albuterol and steroid course.  slow improvement.  discussed plan to start work on Monday, but to call us if doesn't feel up for it (h/o severe bronchitis in years past necessitating out of work for up to 5wks because not aggressively  treated/over did it at work).  as I will be gone next week, will route to pcp to be aware.  will recommend pt return for PFTs to further evaluate lungs when well.  Her updated medication list for this problem includes:    Doxycycline Hyclate 100 Mg Caps (Doxycycline hyclate) ..... One tab by mouth two times a day    Ventolin Hfa 108 (90 Base) Mcg/act Aers (Albuterol sulfate) .Marland Kitchen... 2 puffs q6 hours as needed cough    Mucinex 600 Mg Xr12h-tab (Guaifenesin) ..... One twice daily with fluid  Orders: Ipratropium inhalation sol. unit dose (E4540) Albuterol Sulfate Sol 1mg  unit dose (J8119) Nebulizer Tx (14782)  Complete Medication List: 1)  Imitrex 50 Mg Tabs (Sumatriptan succinate) .... Take 1 to 2  tablet by mouth once a day as needed migraine, may repeat x 2 tab  if no relief in 2 hours. may 200 mg in 24 hours. 2)  Duke's Magic Mouthwash  .... Swish and swallow as needed 3)  Metoprolol Tartrate 25 Mg Tabs (Metoprolol tartrate) .Marland Kitchen.. 1 tab by mouth two times a day 4)  Lansoprazole 30 Mg Cpdr (Lansoprazole) .Marland Kitchen.. 1 tab  by mouth daily 5)  Calcium 600/vitamin D 600-400 Mg-unit Chew (Calcium carbonate-vitamin d) .Marland Kitchen.. 1 tab by mouth two times a day 6)  Advair  7)  Doxycycline Hyclate 100 Mg Caps (Doxycycline hyclate) .... One tab by mouth two times a day 8)  Ventolin Hfa 108 (90 Base) Mcg/act Aers (Albuterol sulfate) .... 2 puffs q6 hours as needed cough 9)  Prednisone 20 Mg Tabs (Prednisone) .... 2 daily for 7 days 10)  Mucinex 600 Mg Xr12h-tab (Guaifenesin) .... One twice daily with fluid  Patient Instructions: 1)  Get plenty of rest over weekend.  push fluids. 2)   continue albuterol every 4 hours 3)  continue doxycycline and steroids. 4)  Hopeful to be able to return to work on Monday.  If unable, call us and schedule an appointment with Dr. Ermalene Searing. 5)  Return in 1-2 months for follow up with Dr. Ermalene Searing for lung function tests.   Medication Administration  Medication # 1:     Medication: Ipratropium inhalation sol. unit dose    Diagnosis: BRONCHITIS- ACUTE (ICD-466.0)    Dose: 0.5    Route: inhaled    Exp Date: 08/04/2010    Lot #: [id number]    Mfr: Nephron    Comments: Per Dr. Sharen Hones    Patient tolerated medication without complications    Given by: Selena Batten Dance CMA Duncan Dull) (June 19, 2010 10:58 AM)  Medication # 2:    Medication: Albuterol Sulfate Sol 1mg  unit dose    Diagnosis: BRONCHITIS- ACUTE (ICD-466.0)    Dose: 2.5    Route: inhaled    Exp Date: 08/04/2010    Lot #: [id number]    Mfr: Nephron    Comments: Per Dr. Sharen Hones    Patient tolerated medication without complications    Given by: Selena Batten Dance CMA Duncan Dull) (June 19, 2010 10:59 AM)  Orders  Added: 1)  Ipratropium inhalation sol. unit dose [J7644] 2)  Albuterol Sulfate Sol 1mg  unit dose [J7613] 3)  Nebulizer Tx [94640] 4)  Est. Patient Level III [84696]    Current Allergies (reviewed today): ! SULFA ! ZITHROMAX Z-PAK (AZITHROMYCIN)

## 2010-07-09 NOTE — Progress Notes (Signed)
Summary: needs new note for work  Phone Note Call from Patient Call back at [phone]   Caller: Patient Call For: Kerby Nora MD Summary of Call: Pt is asking if she can have a note to return to work full time on friday.  She said her supervisor suggested this because there is more paper work involved if she returns to work part time.    Note needs to read that she is to start back full time on friday, instead of part time today.  Please call when ready. Initial call taken by: Lowella Petties CMA, AAMA,  June 23, 2010 11:46 AM  Follow-up for Phone Call        Given she will get more rest not reurning to work parttime... instead.. why don't we have her return to work full time on Thursday.  Please let ot know and wirte work excuse saying this.  Follow-up by: Kerby Nora MD,  June 23, 2010 1:17 PM  Additional Follow-up for Phone Call Additional follow up Details #1::        Patient advised note ready for pick up Additional Follow-up by: Benny Lennert CMA Duncan Dull),  June 24, 2010 9:56 AM

## 2010-07-09 NOTE — Letter (Signed)
Summary: Out of Work  [employer] at [facility]  [address] [place], [place] [address]   Phone: [phone]  Fax: [phone]    June 24, 2010   Employee:  [name]    To Whom It May Concern:   For Medical reasons, please excuse the above named employee from work for the following dates:  Start:   June 22 2010  End:   June 25 2010 Patient may return to work full time on this day.  If you need additional information, please feel free to contact our office.         Sincerely,   Kerby Nora MD

## 2010-07-13 ENCOUNTER — Telehealth: Payer: Self-pay | Admitting: Family Medicine

## 2010-07-13 ENCOUNTER — Ambulatory Visit (INDEPENDENT_AMBULATORY_CARE_PROVIDER_SITE_OTHER): Payer: Self-pay | Admitting: Family Medicine

## 2010-07-13 ENCOUNTER — Encounter: Payer: Self-pay | Admitting: Family Medicine

## 2010-07-13 DIAGNOSIS — J45909 Unspecified asthma, uncomplicated: Secondary | ICD-10-CM

## 2010-07-16 ENCOUNTER — Telehealth: Payer: Self-pay | Admitting: Family Medicine

## 2010-07-22 ENCOUNTER — Encounter: Payer: Self-pay | Admitting: Family Medicine

## 2010-07-22 ENCOUNTER — Ambulatory Visit (INDEPENDENT_AMBULATORY_CARE_PROVIDER_SITE_OTHER): Payer: BC Managed Care – PPO | Admitting: Family Medicine

## 2010-07-22 DIAGNOSIS — J45909 Unspecified asthma, uncomplicated: Secondary | ICD-10-CM

## 2010-07-23 NOTE — Assessment & Plan Note (Signed)
Summary: COUGH,RUNNY NOSE,CONGESTION/CLE  [employer]   Vital Signs:  Patient profile:   60 year old female Height:      64 inches Weight:      178.50 pounds BMI:     30.75 O2 Sat:      97 % on Room air Temp:     99.0 degrees F oral Pulse rate:   67 / minute Pulse rhythm:   regular BP sitting:   130 / 86  (right arm) Cuff size:   large  Vitals Entered By: Linde Gillis CMA (AAMA) (July 13, 2010 12:00 PM)  O2 Flow:  Room air CC: cough, congestion, runny nose   History of Present Illness: Chief complaint congestion.  1/5 2010.. seen by Dr. Hetty Ely.. treated with doxy and proair as needed.  Seen 1/10 and 1/13 by Dr. Reece Agar., presumed RAD- treated with steroid course.  Recommended PFTs when well in next 1-2 months.  She as hx of severe bronchitis.Marland Kitchen out of work for 5 weeks in past.   She completed prednisone and antibitoic on 1/16.  1/17- saw her primary, Dr. Ermalene Searing.  Peak flow was 310.  Started on Advair and Mucinex still taking Mucinex, stopped taking Advair after two weeks because it made her hoarse.  Not using Albuterol because not wheezing.  Went back to work on 1/20.  Was feeling better until 2 days ago, starting to have a dry cough again.  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  No fevers, chills, wheezing or other symptoms.    Current Medications (verified): 1)  Imitrex 50 Mg Tabs (Sumatriptan Succinate) .... Take 1 To 2  Tablet By Mouth Once A Day As Needed Migraine, May Repeat X 2 Tab  If No Relief in 2 Hours. May 200 Mg in 24 Hours. 2)  Metoprolol Tartrate 25 Mg Tabs (Metoprolol Tartrate) .Marland Kitchen.. 1 Tab By Mouth Two Times A Day 3)  Lansoprazole 30 Mg Cpdr (Lansoprazole) .Marland Kitchen.. 1 Tab  By Mouth Daily 4)  Calcium 600/vitamin D 600-400 Mg-Unit Chew (Calcium Carbonate-Vitamin D) .Marland Kitchen.. 1 Tab By Mouth Two Times A Day 5)  Advair 6)  Ventolin Hfa 108 (90 Base) Mcg/act Aers (Albuterol Sulfate) .... 2 Puffs Q6 Hours As Needed Cough 7)  Mucinex 600 Mg Xr12h-Tab  (Guaifenesin) .... One Twice Daily With Fluid 8)  Advair Diskus 250-50 Mcg/dose Aepb (Fluticasone-Salmeterol) .Marland Kitchen.. 1 Inh Two Times A Day X 1-2 Weeks  Allergies: 1)  ! Sulfa 2)  ! Zithromax Z-Pak (Azithromycin)  Past History:  Past Medical History: Last updated: 06/19/2010 Allergic rhinitis GERD Hypertension Chronic Headaches Colon Polyps 2005 (adenoma), none 2008 h/o recurrent bronchitis  Past Surgical History: Last updated: 10/10/2008 Tonsillectomy Partial hysterectomy for prolapse, no cancer, both ovaries remain  Family History: Last updated: 12/11/2008 father: CAD MI age 81, HTN, high chol mother: CAD, CABG age 31, HTN, high chol sister: aneurysm in brain age 2 brother: congenital heart defect..? ASD/VSD brothrer: HTN PGM: breast cancer Family History of Colon Cancer:Paternal uncle and grandmother   Social History: Last updated: 10/10/2008 Occupation: Tourist information centre manager Married 3 children: healthy Never Smoked Alcohol use-yes, wine 3 times a week Drug use-no Regular exercise-no Diet: fruits , veggies, fiber, water  Risk Factors: Exercise: no (10/10/2008)  Risk Factors: Smoking Status: never (10/10/2008)  Review of Systems      See HPI General:  Denies fever. ENT:  Denies nasal congestion, sinus pressure, and sore throat. Resp:  Complains of cough; denies shortness of breath, sputum productive, and  wheezing.  Physical Exam  General:  Well-developed,well-nourished,in no acute distress; alert,appropriate and cooperative throughout examination Mouth:  Oral mucosa and oropharynx without lesions or exudates.  Teeth in good repair. Lungs:  Normal respiratory effort, chest expands symmetrically. Lungs are clear to auscultation, no crackles or wheezes. Heart:  Normal rate and regular rhythm. S1 and S2 normal without gallop, murmur, click, rub or other extra sounds. Extremities:  no pedal edema Neurologic:  No cranial nerve deficits noted. Station and gait are normal.  . Sensory, motor and coordinative functions appear intact. Psych:  Cognition and judgment appear intact. Alert and cooperative with normal attention span and concentration. No apparent delusions, illusions, hallucinations   Impression & Recommendations:  Problem # 1:  ? of ASTHMA, UNSPECIFIED, UNSPECIFIED STATUS (ICD-493.90) Assessment Unchanged Appears stable.  Lungs clear.   I would not restart an antibiotic or advair at this point.  I advised her to keep appt with Dr. Ermalene Searing for later this month for repeat PFTs.  Pt to contact us if any other symptoms worsen. Her updated medication list for this problem includes:    Ventolin Hfa 108 (90 Base) Mcg/act Aers (Albuterol sulfate) .Marland Kitchen... 2 puffs q6 hours as needed cough    Advair Diskus 250-50 Mcg/dose Aepb (Fluticasone-salmeterol) .Marland Kitchen... 1 inh two times a day x 1-2 weeks  Complete Medication List: 1)  Imitrex 50 Mg Tabs (Sumatriptan succinate) .... Take 1 to 2  tablet by mouth once a day as needed migraine, may repeat x 2 tab  if no relief in 2 hours. may 200 mg in 24 hours. 2)  Metoprolol Tartrate 25 Mg Tabs (Metoprolol tartrate) .Marland Kitchen.. 1 tab by mouth two times a day 3)  Lansoprazole 30 Mg Cpdr (Lansoprazole) .Marland Kitchen.. 1 tab  by mouth daily 4)  Calcium 600/vitamin D 600-400 Mg-unit Chew (Calcium carbonate-vitamin d) .Marland Kitchen.. 1 tab by mouth two times a day 5)  Advair  6)  Ventolin Hfa 108 (90 Base) Mcg/act Aers (Albuterol sulfate) .... 2 puffs q6 hours as needed cough 7)  Mucinex 600 Mg Xr12h-tab (Guaifenesin) .... One twice daily with fluid 8)  Advair Diskus 250-50 Mcg/dose Aepb (Fluticasone-salmeterol) .Marland Kitchen.. 1 inh two times a day x 1-2 weeks   Orders Added: 1)  Est. Patient Level III [45409]    Current Allergies (reviewed today): ! SULFA ! ZITHROMAX Z-PAK (AZITHROMYCIN)

## 2010-07-23 NOTE — Letter (Signed)
Summary: [employer] Disability claim  [employer] Disability claim   Imported By: Kassie Mends 07/13/2010 11:50:14  _____________________________________________________________________  External Attachment:    Type:   Image     Comment:   External Document

## 2010-07-23 NOTE — Progress Notes (Signed)
Summary: wants antibiotic   Phone Note Call from Patient Call back at [phone]   Caller: Patient Call For: Kerby Nora MD Summary of Call: Patient saw Dr. Dayton Martes on monday. She is feeling worse now, still having fever, alot of pressure in head, congestion. She has been taking zyrtec at night and mucinex at night  and tylenol for fever and pain. She is asking if she could get an antibiotic called in to [facility]. Initial call taken by: Melody Comas,  July 16, 2010 3:56 PM  Follow-up for Phone Call        reviewed Dr. Elmer Sow note.  sounds like could be sinusitis w/ new sxs of congestion, ha, fever, going on for 6 days now.  can call in amoxicillin twice daily for 10 days.  update if not improving.  called and informed.  advised continued supportive care Follow-up by: Eustaquio Boyden  MD,  July 16, 2010 5:28 PM    New/Updated Medications: AMOXICILLIN 875 MG TABS (AMOXICILLIN) take one by mouth two times a day x 10 days Prescriptions: AMOXICILLIN 875 MG TABS (AMOXICILLIN) take one by mouth two times a day x 10 days  #20 x 0   Entered and Authorized by:   Eustaquio Boyden  MD   Signed by:   Eustaquio Boyden  MD on 07/16/2010   Method used:   [id number]       [address]       [place], [place]  [address]       Ph: [phone]       Fax: [phone]   RxID:   [id number]

## 2010-07-23 NOTE — Progress Notes (Signed)
Summary: call a nurse   Phone Note Call from Patient   Caller: Patient Call For: Kerby Nora MD Summary of Call: Triage Record Num: [id number] Operator: Di Kindle Patient Name: [name] Call Date & Time: 07/12/2010 10:23:25AM Patient Phone: [phone] PCP: Kerby Nora Patient Gender: Female PCP Fax : [phone] Patient DOB: 1950/06/24 Practice Name: [facility] Reason for Call: Pt calling to report HX of bronchitis, completed ABX, and advair. Now new onset of cough, afebrile. Guideline: cough, clear productive cough, advised of need to be seen in 24 hrs. Protocol(s) Used: Cough - Adult Recommended Outcome per Protocol: See Provider within 24 hours Reason for Outcome: Productive cough with colored sputum (other than clear or white sputum) Care Advice:  ~ Use a cool mist humidifier to moisten air. Be sure to clean according to manufacturer's instructions. Call provider if fever greater than 101.5 F (38.6 C) or 100.5 F (38.1C) in an immunocompromised patient (such as diabetes, HIV/AIDS, renal disease, chemotherapy, organ transplant, or chronic steroid use) has not improved in 24 hours.  ~ Increase fluids to 8-12 eight oz (1.6 to 2.4 liters) glasses per day, half of them to be water. Soups, popsicles, fruit juices, non-caffeinated sodas (unless restricting sodium intake), jello, broths, decaf teas, etc. are all okay. Warm fluids can be soothing.  ~  ~ If you can, stop smoking now and avoid all secondhand smoke. Coughing up mucus or phlegm helps to get rid of an infection. A productive cough should not be stopped. A cough medicine with guaifenesin (Robitussin, Mucinex) can help loosen the mucus. Cough medicine with dextromethorphan (DM) should be avoided. Drinking lots of fluids can help loosen the mucus too, especially warm fluids.  ~ 07/12/2010 10:32:18AM Page 1 of 1 [id number] Initial call taken by: Melody Comas,  July 13, 2010 8:11 AM

## 2010-07-29 NOTE — Assessment & Plan Note (Signed)
Summary: SINUS INFECTION and BRONCHITIS / LFW   Vital Signs:  Patient profile:   60 year old female Height:      64 inches Weight:      175.50 pounds BMI:     30.23 Temp:     98.7 degrees F oral Pulse rate:   60 / minute Pulse rhythm:   regular BP sitting:   130 / 90  (right arm) Cuff size:   regular  Vitals Entered By: Linde Gillis CMA Duncan Dull) (July 22, 2010 9:02 AM) CC: follow up sinus infection and bronchitis   History of Present Illness: Follow up sinus infection and bronchitis.  1/5 2010.. seen by Dr. Hetty Ely.. treated with doxy and proair as needed.  Seen 1/10 and 1/13 by Dr. Reece Agar., presumed RAD- treated with steroid course.  Recommended PFTs when well in next 1-2 months.  She as hx of severe bronchitis.Marland Kitchen out of work for 5 weeks in past.   She completed prednisone and antibitoic on 1/16.  1/17- saw her primary, Dr. Ermalene Searing.  Peak flow was 310.  Started on Advair and Mucinex still taking Mucinex, stopped taking Advair after two weeks because it made her hoarse.  Not using Albuterol because not wheezing.  Went back to work on 1/20.  2/9- started amoxicillin 875 mg two times a day because of worsening symptoms.  Feels much better.  Cough is resolving, sinus pressure improved.  Hoarsness is slowly improving. Wants to return to work on Friday, 07/24/2010.  Current Medications (verified): 1)  Imitrex 50 Mg Tabs (Sumatriptan Succinate) .... Take 1 To 2  Tablet By Mouth Once A Day As Needed Migraine, May Repeat X 2 Tab  If No Relief in 2 Hours. May 200 Mg in 24 Hours. 2)  Metoprolol Tartrate 25 Mg Tabs (Metoprolol Tartrate) .Marland Kitchen.. 1 Tab By Mouth Two Times A Day 3)  Lansoprazole 30 Mg Cpdr (Lansoprazole) .Marland Kitchen.. 1 Tab  By Mouth Daily 4)  Calcium 600/vitamin D 600-400 Mg-Unit Chew (Calcium Carbonate-Vitamin D) .Marland Kitchen.. 1 Tab By Mouth Two Times A Day 5)  Advair 6)  Ventolin Hfa 108 (90 Base) Mcg/act Aers (Albuterol Sulfate) .... 2 Puffs Q6 Hours As Needed Cough 7)  Mucinex 600 Mg  Xr12h-Tab (Guaifenesin) .... One Twice Daily With Fluid 8)  Advair Diskus 250-50 Mcg/dose Aepb (Fluticasone-Salmeterol) .Marland Kitchen.. 1 Inh Two Times A Day X 1-2 Weeks 9)  Amoxicillin 875 Mg Tabs (Amoxicillin) .... Take One By Mouth Two Times A Day X 10 Days  Allergies: 1)  ! Sulfa 2)  ! Zithromax Z-Pak (Azithromycin)  Past History:  Past Medical History: Last updated: 06/19/2010 Allergic rhinitis GERD Hypertension Chronic Headaches Colon Polyps 2005 (adenoma), none 2008 h/o recurrent bronchitis  Past Surgical History: Last updated: 10/10/2008 Tonsillectomy Partial hysterectomy for prolapse, no cancer, both ovaries remain  Family History: Last updated: 12/11/2008 father: CAD MI age 22, HTN, high chol mother: CAD, CABG age 85, HTN, high chol sister: aneurysm in brain age 25 brother: congenital heart defect..? 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 Resp:  Complains of cough; denies pleuritic, shortness of breath, sputum productive, and  wheezing.  Physical Exam  General:  Well-developed,well-nourished,in no acute distress; alert,appropriate and cooperative throughout examination O2 sat 97% Eyes:  Conjunctiva clear bilaterally.  Ears:  External ear exam shows no significant lesions or deformities.  Otoscopic examination reveals clear canals, tympanic membranes are intact bilaterally without bulging, retraction, inflammation or discharge. Hearing is grossly normal bilaterally. Mouth:  Oral mucosa and oropharynx without lesions or exudates.   Teeth in good repair. Lungs:  Normal respiratory effort, chest expands symmetrically. Lungs are clear to auscultation, no crackles or wheezes. Heart:  Normal rate and regular rhythm. S1 and S2 normal without gallop, murmur, click, rub or other extra sounds. Extremities:  no pedal edema Neurologic:  No cranial nerve deficits noted. Station and gait are normal. . Sensory, motor and coordinative functions appear intact. Skin:  scattered erythematous papules, no blisters, no pustules, no oral/mucosal lesions Psych:  Cognition and judgment appear intact. Alert and cooperative with normal attention span and concentration. No apparent delusions, illusions, hallucinations   Impression & Recommendations:  Problem # 1:  ? of ASTHMA, UNSPECIFIED, UNSPECIFIED STATUS (ICD-493.90) Assessment Improved Lungs clear on exam today. Sinus symptoms improving s/p amoxicillin. Continue supporitve care, awaiting forms from her job for [place] to fill out stating to she can return to work on 2/17. Her updated medication list for this problem includes:    Ventolin Hfa 108 (90 Base) Mcg/act Aers (Albuterol sulfate) .Marland Kitchen... 2 puffs q6 hours as needed cough    Advair Diskus 250-50 Mcg/dose Aepb (Fluticasone-salmeterol) .Marland Kitchen... 1 inh two times a day x 1-2 weeks  Complete Medication List: 1)  Imitrex 50 Mg Tabs (Sumatriptan succinate) .... Take 1 to 2  tablet by mouth once a day as needed migraine, may repeat x 2 tab  if no relief in 2 hours. may 200 mg in 24 hours. 2)  Metoprolol Tartrate 25 Mg Tabs (Metoprolol tartrate) .Marland Kitchen.. 1 tab by mouth two times a day 3)  Lansoprazole 30 Mg Cpdr (Lansoprazole) .Marland Kitchen.. 1 tab  by mouth daily 4)  Calcium 600/vitamin D 600-400 Mg-unit Chew (Calcium carbonate-vitamin d) .Marland Kitchen.. 1 tab by mouth two times a day 5)  Advair  6)  Ventolin Hfa 108 (90 Base) Mcg/act Aers (Albuterol sulfate) .... 2 puffs q6 hours as needed cough 7)  Mucinex 600 Mg Xr12h-tab (Guaifenesin) .... One twice daily with fluid 8)   Advair Diskus 250-50 Mcg/dose Aepb (Fluticasone-salmeterol) .Marland Kitchen.. 1 inh two times a day x 1-2 weeks 9)  Amoxicillin 875 Mg Tabs (Amoxicillin) .... Take one by mouth two times a day x 10 days   Orders Added: 1)  Est. Patient Level III [62130]    Current Allergies (reviewed today): ! SULFA ! ZITHROMAX Z-PAK (AZITHROMYCIN)

## 2010-07-30 ENCOUNTER — Encounter: Payer: Self-pay | Admitting: Family Medicine

## 2010-07-30 ENCOUNTER — Telehealth: Payer: Self-pay | Admitting: Family Medicine

## 2010-07-30 ENCOUNTER — Ambulatory Visit (INDEPENDENT_AMBULATORY_CARE_PROVIDER_SITE_OTHER): Payer: BC Managed Care – PPO | Admitting: Family Medicine

## 2010-07-30 DIAGNOSIS — Z0279 Encounter for issue of other medical certificate: Secondary | ICD-10-CM

## 2010-07-30 DIAGNOSIS — J019 Acute sinusitis, unspecified: Secondary | ICD-10-CM

## 2010-08-04 NOTE — Progress Notes (Signed)
Summary: disability forms  Phone Note Call from Patient   Summary of Call: Disability forms from [employer] are on your desk, pt was out of work due to a sinus infection.             Lowella Petties CMA, AAMA  July 30, 2010 12:39 PM   Follow-up for Phone Call        on [place] desk, completed. Ruthe Mannan MD  July 30, 2010 1:53 PM  Forms faxed to [facility], sent to be scanned into EMR and patient notified that forms were ready and there would be a $20 charge.  Forms given to [facility].  Follow-up by: Linde Gillis CMA Duncan Dull),  July 31, 2010 7:52 AM

## 2010-08-04 NOTE — Assessment & Plan Note (Signed)
Summary: ? SINUS INFECTION   Vital Signs:  Patient profile:   60 year old female Weight:      176 pounds Temp:     98.5 degrees F oral Pulse rate:   64 / minute Pulse rhythm:   regular BP sitting:   124 / 72  (left arm) Cuff size:   large  Vitals Entered By: Selena Batten Dance CMA Duncan Dull) (July 30, 2010 3:33 PM) CC: ? Sinus infection   History of Present Illness: CC: ?sinus infection  1/5 2010.. seen by Dr. Hetty Ely.. treated with doxy and proair as needed.  Seen 1/10 and 1/13 by Dr. Reece Agar., presumed RAD- treated with steroid course.  Recommended PFTs when well in next 1-2 months.  She has hx of severe bronchitis.Marland Kitchen out of work for 5 weeks in past.   She completed prednisone and antibitoic on 1/16.  1/17- saw her primary, Dr. Ermalene Searing.  Peak flow was 310.  Started on Advair and Mucinex still taking Mucinex, stopped taking Advair after two weeks because it made her hoarse.  Not using Albuterol because not wheezing.  Went back to work on 1/20.  2/9- started amoxicillin 875 mg two times a day because of worsening symptoms and concern for developing sinusitis.  Seen last week by Dr. Dayton Martes.  was feeling better.  returned to work Friday 07/24/2010.  still coughing some at work.  finished amoxicillin a few days ago.  Always has had some cough, productive of mild sputum.  yesterday started feeling nose start to run, now feeling very congested again.  much rhinorrhea, clear from nose.  Also having sinus pressure headaches.  + PNDrip.  sinus pressure worse with cough and bending head forward.  no longer using albuterol, but is using mucinex one daily.  on lansoprazole longstanding.    No fevers/chills, abd pain.  No ST.  has not had sinus issues in past.  Current Medications (verified): 1)  Imitrex 50 Mg Tabs (Sumatriptan Succinate) .... Take 1 To 2  Tablet By Mouth Once A Day As Needed Migraine, May Repeat X 2 Tab  If No Relief in 2 Hours. May 200 Mg in 24 Hours. 2)  Metoprolol Tartrate 25 Mg Tabs  (Metoprolol Tartrate) .Marland Kitchen.. 1 Tab By Mouth Two Times A Day 3)  Lansoprazole 30 Mg Cpdr (Lansoprazole) .Marland Kitchen.. 1 Tab  By Mouth Daily 4)  Calcium 600/vitamin D 600-400 Mg-Unit Chew (Calcium Carbonate-Vitamin D) .Marland Kitchen.. 1 Tab By Mouth Two Times A Day 5)  Ventolin Hfa 108 (90 Base) Mcg/act Aers (Albuterol Sulfate) .... 2 Puffs Q6 Hours As Needed Cough 6)  Mucinex 600 Mg Xr12h-Tab (Guaifenesin) .... One Twice Daily With Fluid  Allergies: 1)  ! Sulfa 2)  ! 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  congested. Head:  Normocephalic and atraumatic without obvious abnormalities. mild maxillary sinus tenderness Eyes:  Conjunctiva clear bilaterally. PERRLA Ears:  Tms clear bilaterally Nose:  nares congested bilaterally, purulent discharge evident Mouth:  Oral mucosa and oropharynx without lesions or exudates.  Teeth in good repair. Neck:  no  carotid bruit or thyromegaly no cervical LAD Lungs:  Normal respiratory effort, chest expands symmetrically. Lungs are clear to auscultation, no crackles or wheezes. Heart:  Normal rate and regular rhythm. S1 and S2 normal without gallop, murmur, click, rub or other extra sounds. Pulses:  2+ rad pulses, brisk cap refill Extremities:  no pedal edema   Impression & Recommendations:  Problem # 1:  SINUSITIS, ACUTE (ICD-461.9) Assessment Deteriorated in setting of recent treated sinustiis that improved on  amoxicillin, but returning 2 days after completed previous 10 day course.  concern for returning sinus infection, prolong course another 10 days, start INS for sinus congestion as well as h/o allergic rhinitis.  suportive care discussed.  will use saline spray as well as continue mucinex.  The following medications were removed from the medication list:    Amoxicillin 875 Mg Tabs (Amoxicillin) .Marland Kitchen... Take one by mouth two times a day x 10 days Her updated medication list for this problem includes:    Mucinex 600 Mg Xr12h-tab (Guaifenesin) ..... One twice daily with fluid    Flonase 50 Mcg/act Susp (Fluticasone propionate) .Marland Kitchen... 2 sprays in each nostril daily    Amoxicillin 875 Mg Tabs (Amoxicillin) .Marland Kitchen... Twice daily for 10 days  Complete Medication List: 1)  Imitrex 50 Mg Tabs (Sumatriptan succinate) .... Take 1 to 2  tablet by mouth once a day as needed migraine, may repeat x 2 tab  if no relief in 2 hours. may 200 mg in 24 hours. 2)  Metoprolol Tartrate 25 Mg Tabs (Metoprolol tartrate) .Marland Kitchen.. 1 tab by mouth two times a day 3)  Lansoprazole 30 Mg Cpdr (Lansoprazole) .Marland Kitchen.. 1 tab  by mouth daily 4)  Calcium 600/vitamin D 600-400 Mg-unit Chew (Calcium carbonate-vitamin d) .Marland Kitchen.. 1 tab by mouth two times a day 5)  Ventolin Hfa 108 (90 Base) Mcg/act Aers (Albuterol sulfate) .... 2 puffs q6 hours as needed cough 6)  Mucinex 600 Mg Xr12h-tab (Guaifenesin) .... One twice daily with fluid 7)  Flonase 50 Mcg/act Susp (Fluticasone propionate) .... 2 sprays in each nostril daily 8)  Amoxicillin 875 Mg Tabs (Amoxicillin) .... Twice daily for 10 days  Patient Instructions: 1)  Sounds like continuing sinus issue/infection. 2)  another course of amoxicillin as well as start nasal steroid (flonase) 2 sprays in each nostril daily. 3)  also use nasal saline spray. 4)  continue mucinex if you feel it's helping cough up mucous. 5)  Good to see you today. Prescriptions: AMOXICILLIN 875 MG TABS (AMOXICILLIN) twice  daily for 10 days  #20 x 0   Entered and Authorized by:   Eustaquio Boyden  MD   Signed by:   Eustaquio Boyden  MD on 07/30/2010   Method used:   [facility]        [facility]       [address]       [place], [place]  [address]       Ph: [phone] or [phone]       Fax: [phone]   RxID:   [id number] FLONASE 50 MCG/ACT SUSP (FLUTICASONE PROPIONATE) 2 sprays in each nostril daily  #1 x 1   Entered and Authorized by:   Eustaquio Boyden  MD   Signed by:   Eustaquio Boyden  MD on 07/30/2010   Method used:   [facility]        [facility] [phone][facility] (retail)       [address]  [place], [place]  [address]       Ph: [phone] or [phone]       Fax: [phone]   RxID:   [id number]    Orders Added: 1)  Est. Patient Level III [27253]    Current Allergies (reviewed today): ! SULFA ! ZITHROMAX Z-PAK (AZITHROMYCIN)

## 2010-08-11 ENCOUNTER — Telehealth: Payer: Self-pay | Admitting: Family Medicine

## 2010-08-13 NOTE — Letter (Signed)
Summary: [employer] Form  [employer] Form   Imported By: Beau Fanny 08/03/2010 15:26:07  _____________________________________________________________________  External Attachment:    Type:   Image     Comment:   External Document

## 2010-08-18 NOTE — Progress Notes (Signed)
Summary: still not %100  Phone Note Call from Patient Call back at Home Phone [phone]   Caller: Patient Call For: Dr. Sharen Hones Summary of Call: Patient says that she is still not feeling %100, she is still feeling really stopped up. She has been taking zyrtec, mucinex, nettipot. She is asking for other suggestions.  Initial call taken by: Melody Comas,  August 11, 2010 5:12 PM  Follow-up for Phone Call        have her hold zyrtec and start flonase (sent in last visit).  if doing this, give it a few days.  may need longer course abx?  (s/p 2 10d courses) return sooner for fevers, worsening. Follow-up by: Eustaquio Boyden  MD,  August 11, 2010 5:18 PM  Additional Follow-up for Phone Call Additional follow up Details #1::        Detailed message left on home machine advising patient of instructions. Advised her to call back if no improvement or if getting worse.  Additional Follow-up by: Janee Morn CMA (AAMA),  August 12, 2010 11:40 AM

## 2010-08-19 LAB — CBC
HCT: 37.3 % (ref 36.0–46.0)
MCH: 31.9 pg (ref 26.0–34.0)
MCHC: 34.1 g/dL (ref 30.0–36.0)
MCV: 93.6 fL (ref 78.0–100.0)
Platelets: 258 10*3/uL (ref 150–400)
RDW: 12.1 % (ref 11.5–15.5)
WBC: 7.3 10*3/uL (ref 4.0–10.5)

## 2010-08-19 LAB — DIFFERENTIAL
Basophils Absolute: 0 10*3/uL (ref 0.0–0.1)
Basophils Relative: 0 % (ref 0–1)
Eosinophils Absolute: 0.1 10*3/uL (ref 0.0–0.7)
Eosinophils Relative: 2 % (ref 0–5)
Lymphocytes Relative: 29 % (ref 12–46)
Monocytes Absolute: 0.6 10*3/uL (ref 0.1–1.0)

## 2010-08-19 LAB — BASIC METABOLIC PANEL
BUN: 7 mg/dL (ref 6–23)
Chloride: 105 mEq/L (ref 96–112)
Creatinine, Ser: 0.81 mg/dL (ref 0.4–1.2)
Glucose, Bld: 90 mg/dL (ref 70–99)
Potassium: 4.5 mEq/L (ref 3.5–5.1)

## 2010-08-19 LAB — SURGICAL PCR SCREEN: MRSA, PCR: NEGATIVE

## 2010-09-23 ENCOUNTER — Telehealth: Payer: Self-pay | Admitting: *Deleted

## 2010-09-23 DIAGNOSIS — Z1231 Encounter for screening mammogram for malignant neoplasm of breast: Secondary | ICD-10-CM

## 2010-09-23 NOTE — Telephone Encounter (Signed)
Patient is having a screening mammogram done on Tuesday at [facility] and needed Dr. Ermalene Searing to sent the order for the mammogram.  Please advise.

## 2010-09-25 ENCOUNTER — Other Ambulatory Visit: Payer: Self-pay | Admitting: Family Medicine

## 2010-09-25 DIAGNOSIS — N6452 Nipple discharge: Secondary | ICD-10-CM

## 2010-10-20 NOTE — Assessment & Plan Note (Signed)
[name], [name]                ACCOUNT NO.:  [id number]   MEDICAL RECORD NO.:  [id number]          PATIENT TYPE:  POB   LOCATION:  [facility] at [facility]         FACILITY:  [facility]   PHYSICIAN:  Argentina Donovan, MD        DATE OF BIRTH:  [birth year]   DATE OF SERVICE:  02/18/2010                                  CLINIC NOTE   The patient is a 60 year old Caucasian female who was seen on the 8th of  the month with a history of vulvar itching.  There was very discrete  white changes of the vulva, which were biopsied and came back as non-  extramammary Paget's disease.  I have contacted the GYN/Oncology group,  spoke to Dr. Darlin Priestly and she suggested excision.  Dagmar Hait then made  the appointment for March 13, 2010, 2:15 at the [facility] at [facility].  I discussed it with the patient and we talked about the  procedure that she would probably have in the treatment, so she is  scheduled to see Dr. De Blanch on that day and she will  follow up with her treatment in the future.  I have copies of this note  as well as the other sent to her primary care physician with the  pathology.   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  On  examination, the white plaques on the labia have much more obvious than  they were 1 week ago when I first saw the patient and the area of the  biopsy seems to be healing well.  No sign of any inflammation around  that.           ______________________________  Argentina Donovan, MD     PR/MEDQ  D:  02/18/2010  T:  02/18/2010  Job:  [id number]

## 2010-10-20 NOTE — Assessment & Plan Note (Signed)
NAMEJERA, HEADINGS                ACCOUNT NO.:  [id number]   MEDICAL RECORD NO.:  [id number]          PATIENT TYPE:  POB   LOCATION:  [facility] at [facility]         FACILITY:  [facility]   PHYSICIAN:  Argentina Donovan, MD        DATE OF BIRTH:  09/06/1950   DATE OF SERVICE:  02/12/2010                                  CLINIC NOTE   The patient is a 60 year old Caucasian female gravida 4, para 3-0-1-3,  status hysterectomy and A and P repair for uterine prolapse 10 years  ago, whose chief complaint is vulvar itching that has been going on for  about a year.  Different doctors have tried many different medications  for her and it wakes her up at night.  She tries to just rub it and  start to scratching it, but it continues bothering her.  On examination  of the labia, it took me several minutes to realize the skin changes  that were occurring in the area of interest.  It is just at the labia  majora on the right side from about the midportion, down about 4 cm.  If  looked carefully, you do see a small vein underneath the skin, but it is  hard to visualize the white changes of the skin as this patient is very  pale in any case but on careful inspection, it is noted that the labia  minora on the right side has an area of fusion to the labia majora.  There is a whitening of the tissue although difficult to see on a biopsy  with 2 cm area was taken.  The area was in controlled bleeding with  silver nitrate.  I am going to have the patient use sitz baths.  I have  given her prescription for some Xylocaine ointment to apply and have her  come back the next time I am here in the office.  Christina, nurse is  going to let me know what the biopsy report is.   IMPRESSION:  My impression is this vulvar dystrophy, is probably lichen  sclerosus et atrophicus and if it is, then we would treat it  appropriately with clobetasol.  Probable diagnosis has been discussed  with the patient.  At this point, I do not  think that there is any  serious problem here except for the discomfort to the patient and I  would think that we are going to be able to control it to her  satisfaction.  The interesting thing is to do so locally isolated.           ______________________________  Argentina Donovan, MD     PR/MEDQ  D:  02/12/2010  T:  02/12/2010  Job:  [id number]

## 2010-10-27 ENCOUNTER — Encounter: Payer: Self-pay | Admitting: Family Medicine

## 2010-10-30 ENCOUNTER — Ambulatory Visit: Payer: BC Managed Care – PPO | Attending: Gynecology | Admitting: Gynecology

## 2010-10-30 DIAGNOSIS — Z79899 Other long term (current) drug therapy: Secondary | ICD-10-CM | POA: Insufficient documentation

## 2010-10-30 DIAGNOSIS — Z800 Family history of malignant neoplasm of digestive organs: Secondary | ICD-10-CM | POA: Insufficient documentation

## 2010-10-30 DIAGNOSIS — C44599 Other specified malignant neoplasm of skin of other part of trunk: Secondary | ICD-10-CM | POA: Insufficient documentation

## 2010-10-30 DIAGNOSIS — I10 Essential (primary) hypertension: Secondary | ICD-10-CM | POA: Insufficient documentation

## 2010-10-30 DIAGNOSIS — Z803 Family history of malignant neoplasm of breast: Secondary | ICD-10-CM | POA: Insufficient documentation

## 2010-10-30 DIAGNOSIS — K219 Gastro-esophageal reflux disease without esophagitis: Secondary | ICD-10-CM | POA: Insufficient documentation

## 2010-11-03 NOTE — Consult Note (Signed)
  NAMEJENNAVIEVE, [name]                ACCOUNT NO.:  [id number]  MEDICAL RECORD NO.:  [id number]           PATIENT TYPE:  O  LOCATION:  GYN                          FACILITY:  [facility]  PHYSICIAN:  De Blanch, M.D.DATE OF BIRTH:  08-Sep-1950  DATE OF CONSULTATION:  10/30/2010 DATE OF DISCHARGE:                                CONSULTATION   CHIEF COMPLAINT:  Paget's disease of the vulva, vulvar pruritus.  INTERVAL HISTORY:  The patient returns today scheduled for 49-month check.  She has not noticed any new skin lesions, although she has occasional itching.  She has not been using anything for her pruritus. Otherwise, she is doing well.  HISTORY OF PRESENT ILLNESS:  The patient initially underwent surgery for Paget's disease of the vulva on March 24, 2010.  There was focal involvement of the medial margin.  The entire lesion was approximately 5 cm.  The vulva was closed primarily and the patient had good postoperative healing.  PAST MEDICAL HISTORY:  Medical illnesses; hypertension, migraines, low back pain, and GERD.  CURRENT MEDICATIONS:  Metoprolol, Prilosec, magic mouthwash.  DRUG ALLERGIES:  TOOTHPASTE, SULFA, and HAY FEVER.  FAMILY HISTORY:  Grandmother with colon and breast cancer.  SOCIAL HISTORY:  The patient is married.  She comes accompanied by her husband.  OBSTETRICAL HISTORY:  Gravida 3.  REVIEW OF SYSTEMS:  Ten-point comprehensive review of systems negative except as noted above.  PHYSICAL EXAMINATION:  VITAL SIGNS:  Weight 182 pounds, blood pressure 130/80. GENERAL:  The patient is a healthy white female, in no acute distress. PELVIC EXAM:  EG BUS appears normal.  The surgical site is well healed. No lesions are noted.  Because the patient notes an area of pruritus, we applied acetic acid to the anterior vulva and held there for 5 minutes. The vulva was reinspected and found to not have any lesions whatsoever.  IMPRESSION:  Vulvar Paget's disease.   No evidence of recurrence.  Mild pruritus.  The patient is given a prescription for Temovate 0.05% ointment to be applied up to twice a day as needed.  The patient will return to see [place] in 6 months for continuing followup and surveillance.     De Blanch, M.D.     DC/MEDQ  D:  10/30/2010  T:  10/30/2010  Job:  [id number]  cc:   Telford Nab, R.N. [address] [place], [place] [address]  Javier Glazier. Okey Dupre, M.D.  Electronically Signed by De Blanch M.D. on 11/03/2010 08:27:38 AM

## 2010-11-13 ENCOUNTER — Other Ambulatory Visit: Payer: Self-pay | Admitting: Dermatology

## 2010-12-17 ENCOUNTER — Telehealth: Payer: Self-pay

## 2010-12-17 NOTE — Telephone Encounter (Signed)
Patient is having a tightening feeling and cramp in her lower abdomen that feels like a "braxton hicks contraction"  She is very concerned.  I recommend that she comes in to be evaluated.

## 2010-12-17 NOTE — Telephone Encounter (Signed)
Patient has been having tightning and discomfort and swelling  in her abdominal area since her surgery. Dr. Okey Dupre diagnosed and had a vulvactomy and referred her to Dr. Loree Fee. She needs to speak to you about it. She is very concerned.

## 2010-12-30 ENCOUNTER — Ambulatory Visit (INDEPENDENT_AMBULATORY_CARE_PROVIDER_SITE_OTHER): Payer: BC Managed Care – PPO | Admitting: Obstetrics & Gynecology

## 2010-12-30 ENCOUNTER — Encounter: Payer: Self-pay | Admitting: Obstetrics & Gynecology

## 2010-12-30 DIAGNOSIS — R5383 Other fatigue: Secondary | ICD-10-CM

## 2010-12-30 DIAGNOSIS — N949 Unspecified condition associated with female genital organs and menstrual cycle: Secondary | ICD-10-CM

## 2010-12-30 DIAGNOSIS — E669 Obesity, unspecified: Secondary | ICD-10-CM

## 2010-12-30 DIAGNOSIS — R5381 Other malaise: Secondary | ICD-10-CM

## 2010-12-30 DIAGNOSIS — R102 Pelvic and perineal pain: Secondary | ICD-10-CM

## 2010-12-30 LAB — COMPREHENSIVE METABOLIC PANEL
ALT: 16 U/L (ref 0–35)
Albumin: 4.4 g/dL (ref 3.5–5.2)
CO2: 27 mEq/L (ref 19–32)
Glucose, Bld: 82 mg/dL (ref 70–99)
Potassium: 4.2 mEq/L (ref 3.5–5.3)
Sodium: 141 mEq/L (ref 135–145)
Total Bilirubin: 0.5 mg/dL (ref 0.3–1.2)
Total Protein: 6.8 g/dL (ref 6.0–8.3)

## 2010-12-30 LAB — LIPID PANEL
Cholesterol: 215 mg/dL — ABNORMAL HIGH (ref 0–200)
Total CHOL/HDL Ratio: 2.8 Ratio

## 2010-12-30 LAB — TSH: TSH: 1.332 u[IU]/mL (ref 0.350–4.500)

## 2010-12-30 NOTE — Progress Notes (Signed)
  Subjective:    Patient ID: [name], female    DOB: Jan 24, 1951, 60 y.o.   MRN: [id number]  HPI [name] is very concerned today because her best friend was recently diagnosed with Stage 3 ovarian cancer.  [name] has several relatively vague complaints of abdominal/pelvic fullness and "tightening" and some pain.  Her other complaint is that of fatigue.   Review of Systems    currently abstinent as her husband was treated for prostate cancer and has ED She does not need pap smears since her uterus was removed for prolapse issues  Objective:   Physical Exam   EG: no lesions, moderate atrophy, small introitus Bimanual:  Some ill-defined fullness above the vaginal cuff (non tender)       Assessment & Plan:  Pelvic discomfort: I will order a gyn ultrasound Obesity and health maintenance: check fasting lipids, glucose Fatigue: check TSH

## 2010-12-31 ENCOUNTER — Telehealth: Payer: Self-pay | Admitting: *Deleted

## 2010-12-31 DIAGNOSIS — I10 Essential (primary) hypertension: Secondary | ICD-10-CM

## 2010-12-31 MED ORDER — LOSARTAN POTASSIUM 25 MG PO TABS: 25.0000 mg | ORAL_TABLET | Freq: Every day | ORAL | Status: DC

## 2010-12-31 NOTE — Telephone Encounter (Signed)
Patient says that she was on colnidine about five years ago but, then got irregular heart rate from this medication so she was changed to metoprolol to regulate that. Patient is agreeable to starting an additional medication. Patient uses [facility] in [place].

## 2010-12-31 NOTE — Telephone Encounter (Signed)
If she has no neuro changes, no chest pain or SOB... She does not need to go to ER for that level BP. We cannot increase her metoprolol due to lower pulse and this would lower it further. Instead I would add an additional medication. I do not see any other BP med listed in EPIC in the past.. Can you ask her if she has been on any pothers before that she did not tolerate.. Let me know ASAP.

## 2010-12-31 NOTE — Telephone Encounter (Signed)
Patient advised and will call for appt and with results

## 2010-12-31 NOTE — Telephone Encounter (Signed)
Pt states her BP has been up.  It was 165/99 at her gyn office yesterday and again this morning, about 15 minutes after taking her medicine.  She just checked it again and it is 174/94, pulse 57.  She says she feels ok.  Son, who is EMT,  thinks she needs to go to ER.  Please advise, should she increase her metoprolol?

## 2010-12-31 NOTE — Telephone Encounter (Signed)
Sent in rx for losartan low dose. Start this med. Follow BPs daily.. Call with measurements i 1 week.  Also return for kidney test in 7-10 days to make sure no change on new medication.

## 2011-01-01 ENCOUNTER — Other Ambulatory Visit (INDEPENDENT_AMBULATORY_CARE_PROVIDER_SITE_OTHER): Payer: BC Managed Care – PPO | Admitting: *Deleted

## 2011-01-01 DIAGNOSIS — R102 Pelvic and perineal pain: Secondary | ICD-10-CM

## 2011-01-01 DIAGNOSIS — N949 Unspecified condition associated with female genital organs and menstrual cycle: Secondary | ICD-10-CM

## 2011-01-02 LAB — CA 125: CA 125: 4 U/mL (ref 0.0–30.2)

## 2011-01-04 ENCOUNTER — Telehealth: Payer: Self-pay | Admitting: *Deleted

## 2011-01-04 NOTE — Telephone Encounter (Signed)
Patient given results of ca-125 which was negative.  She wishes to proceed with ultrasound due to still having pressure in lower abdomen.

## 2011-01-04 NOTE — Progress Notes (Signed)
Addended by: Barbara Cower on: 01/04/2011 03:46 PM   Modules accepted: Orders

## 2011-01-06 ENCOUNTER — Encounter: Payer: Self-pay | Admitting: Family Medicine

## 2011-01-06 ENCOUNTER — Ambulatory Visit (INDEPENDENT_AMBULATORY_CARE_PROVIDER_SITE_OTHER): Payer: BC Managed Care – PPO | Admitting: Family Medicine

## 2011-01-06 VITALS — BP 150/90 | HR 46 | Temp 98.5°F | Ht 64.0 in | Wt 178.1 lb

## 2011-01-06 DIAGNOSIS — I10 Essential (primary) hypertension: Secondary | ICD-10-CM

## 2011-01-06 MED ORDER — LOSARTAN POTASSIUM 50 MG PO TABS: 50.0000 mg | ORAL_TABLET | Freq: Every day | ORAL | Status: DC

## 2011-01-06 NOTE — Progress Notes (Signed)
[name], a 60 y.o. female presents today in the office for the following:    F/u BP, recently started losartan - about a week ago  Started some losartan and on metoprolol bid 25 mg  Worried some about a friend with ovarian ca.  Some stress.  Topical meds for rash Has gained some weight.  160/85 on my check  The PMH, PSH, Social History, Family History, Medications, and allergies have been reviewed in [facility], and have been updated if relevant.  ROS: GEN: No acute illnesses, no fevers, chills. GI: No n/v/d, eating normally Pulm: No SOB Interactive and getting along well at home.  Otherwise, ROS is as per the HPI.   Physical Exam  Blood pressure 150/90, pulse 46, temperature 98.5 F (36.9 C), temperature source Oral, height 5\' 4"  (1.626 m), weight 178 lb 1.9 oz (80.795 kg), SpO2 98.00%.  GEN: WDWN, NAD, Non-toxic, A & O x 3 HEENT: Atraumatic, Normocephalic. Neck supple. No masses, No LAD. Ears and Nose: No external deformity. CV: RRR, No M/G/R. No JVD. No thrill. No extra heart sounds. PULM: CTA B, no wheezes, crackles, rhonchi. No retractions. No resp. distress. No accessory muscle use. EXTR: No c/c/e NEURO Normal gait.  PSYCH: Normally interactive. Conversant. Not depressed or anxious appearing.  Calm demeanor.   Assessment and Plan: 1.  HTN, unstable: increase losartan to 50 mg a day

## 2011-01-06 NOTE — Patient Instructions (Signed)
F/u 3-4 weeks with Dr. Ermalene Searing about high blood pressure  Take 2 pills of the new losartan tablet a day Then when time to refill, go up to 1 of the 50 mg tablets a day

## 2011-01-08 ENCOUNTER — Ambulatory Visit (HOSPITAL_COMMUNITY): Payer: BC Managed Care – PPO

## 2011-01-13 ENCOUNTER — Ambulatory Visit (HOSPITAL_COMMUNITY)
Admission: RE | Admit: 2011-01-13 | Discharge: 2011-01-13 | Disposition: A | Discharge status: Home / Self Care | Payer: BC Managed Care – PPO | Source: Ambulatory Visit | Attending: Obstetrics & Gynecology | Admitting: Obstetrics & Gynecology

## 2011-01-13 DIAGNOSIS — Z90710 Acquired absence of both cervix and uterus: Secondary | ICD-10-CM | POA: Insufficient documentation

## 2011-01-13 DIAGNOSIS — N949 Unspecified condition associated with female genital organs and menstrual cycle: Secondary | ICD-10-CM | POA: Insufficient documentation

## 2011-01-13 DIAGNOSIS — R102 Pelvic and perineal pain: Secondary | ICD-10-CM

## 2011-01-13 IMAGING — US US PELVIS COMPLETE
1 series · 14 of 25 positions shown · non-contrast
Comparison: None.

CLINICAL DATA: Pelvic pain and pressure.  History of hysterectomy.

TRANSABDOMINAL AND TRANSVAGINAL ULTRASOUND OF PELVIS
TECHNIQUE: Both transabdominal and transvaginal ultrasound
examinations of the pelvis were performed. Transabdominal technique
was performed for global imaging of the pelvis including uterus,
ovaries, adnexal regions, and pelvic cul-de-sac.

[Series 1: us pelvis complete · 14 of 31 slices shown]
[im 1/31]
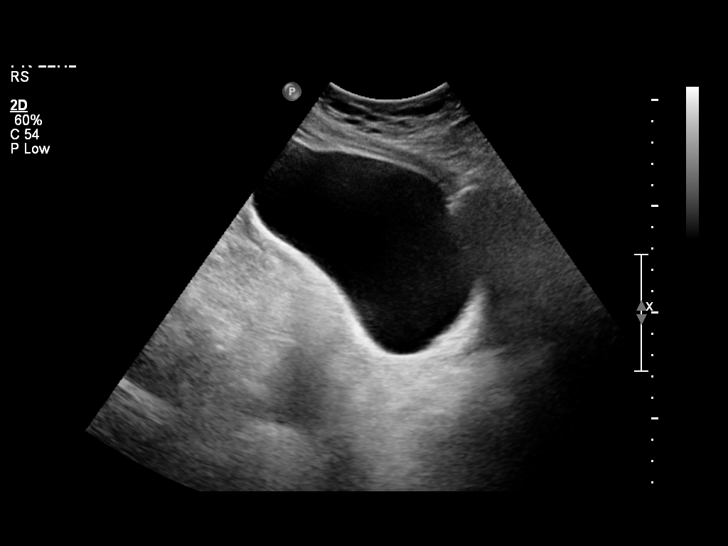
[im 3/31]
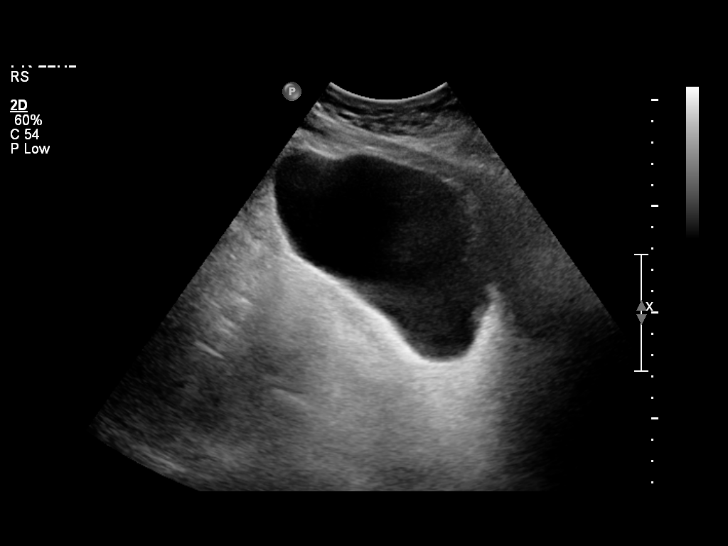
[im 6/31]
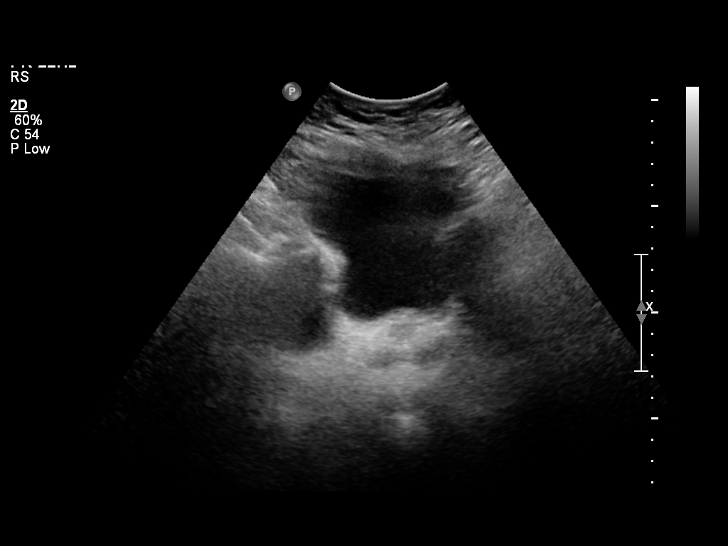
[im 8/31]
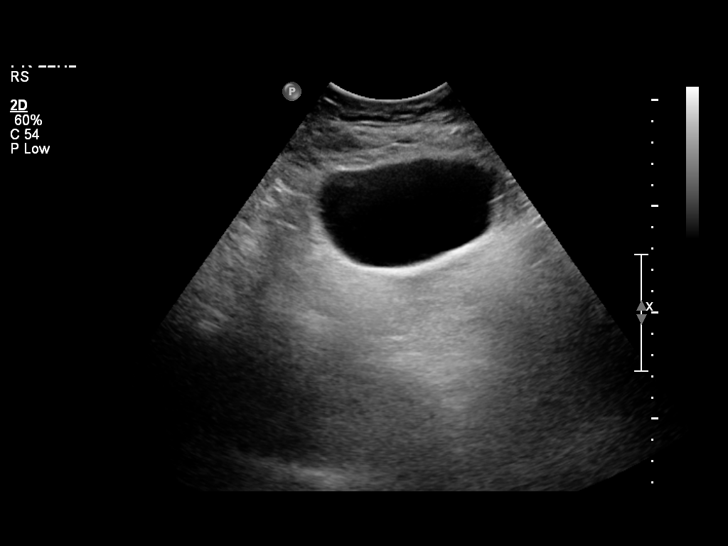
[im 11/31]
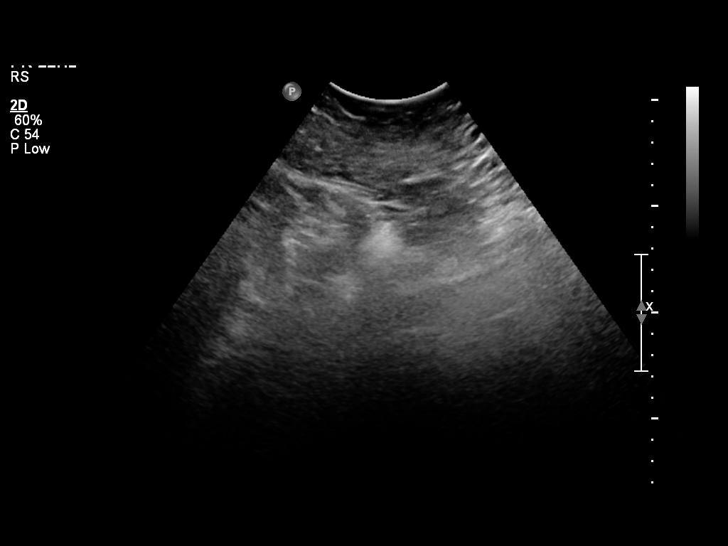
[im 12/31]
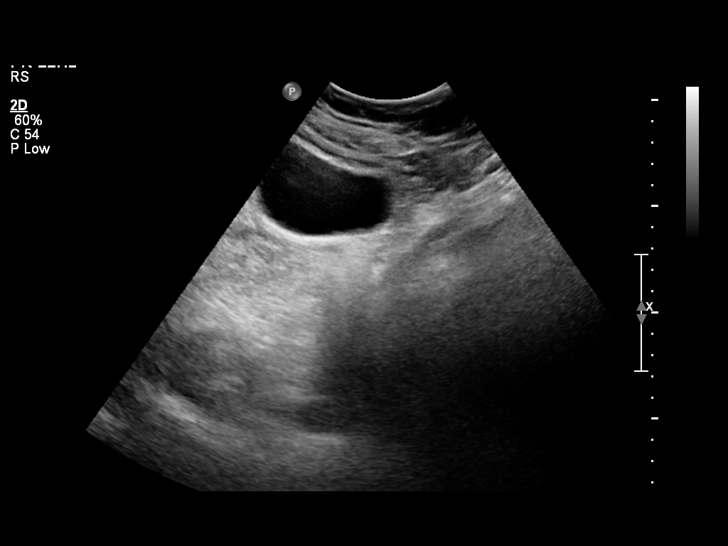
[im 14/31]
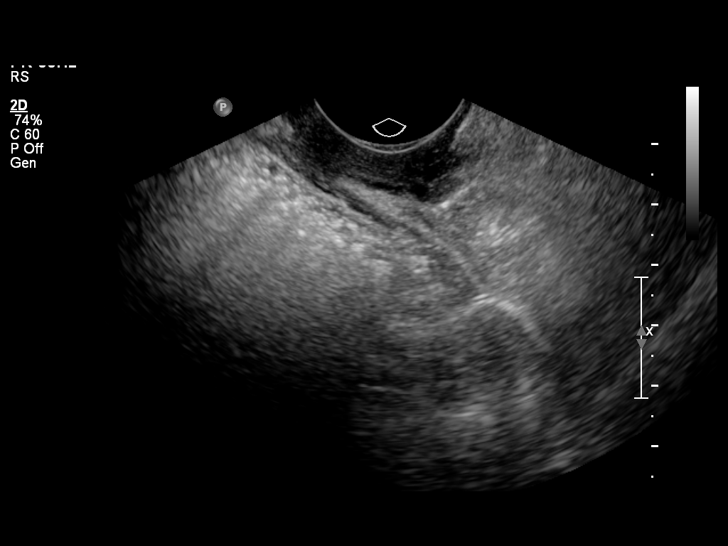
[im 17/31]
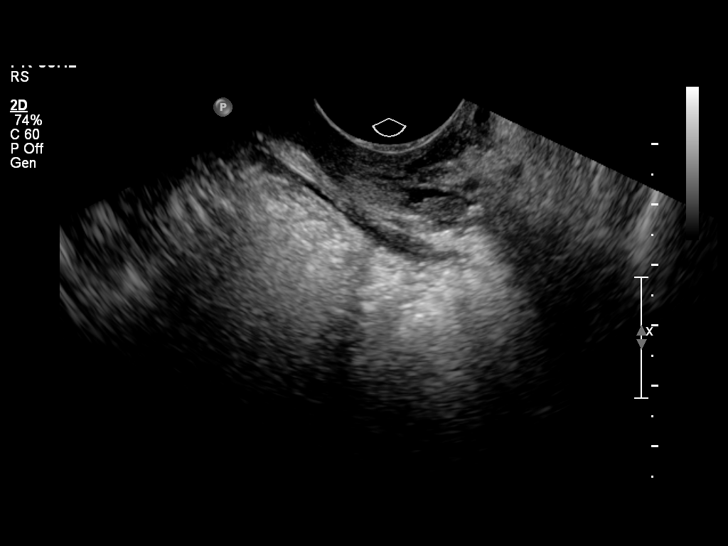
[im 19/31]
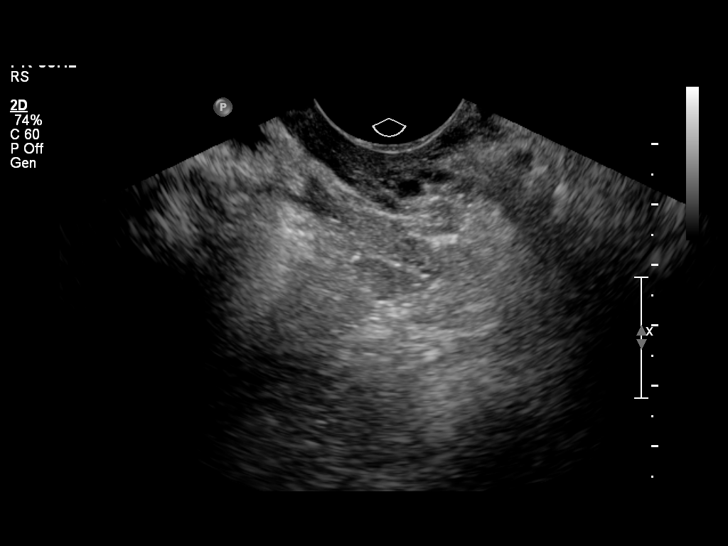
[im 21/31]
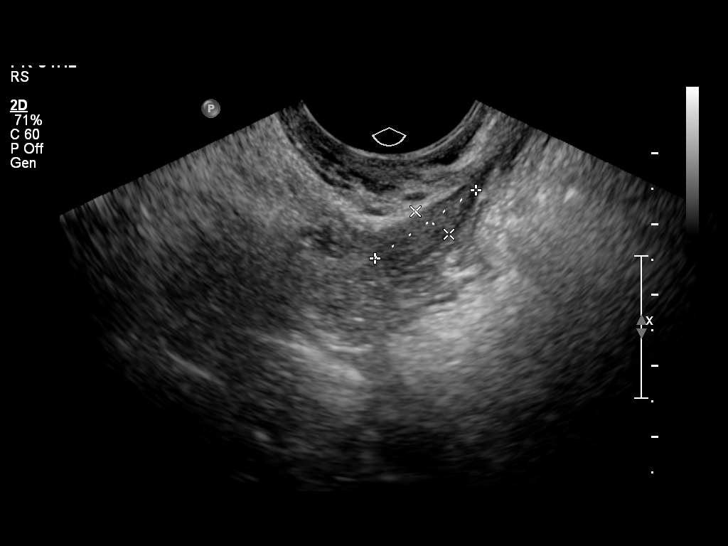
[im 23/31]
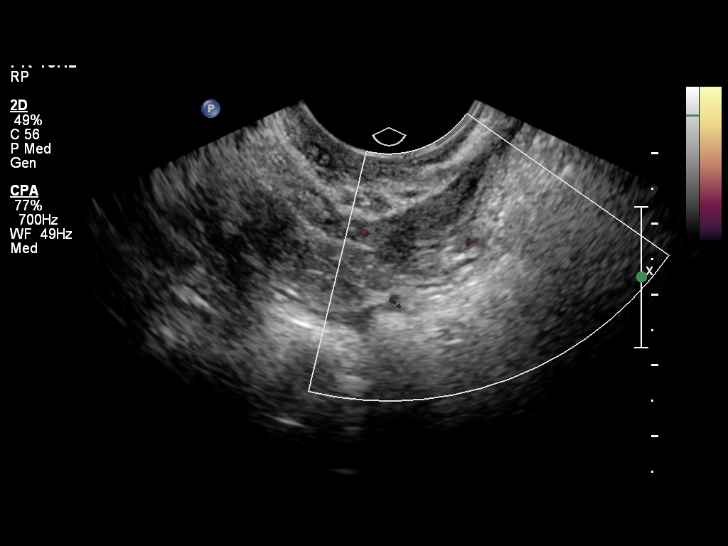
[im 26/31]
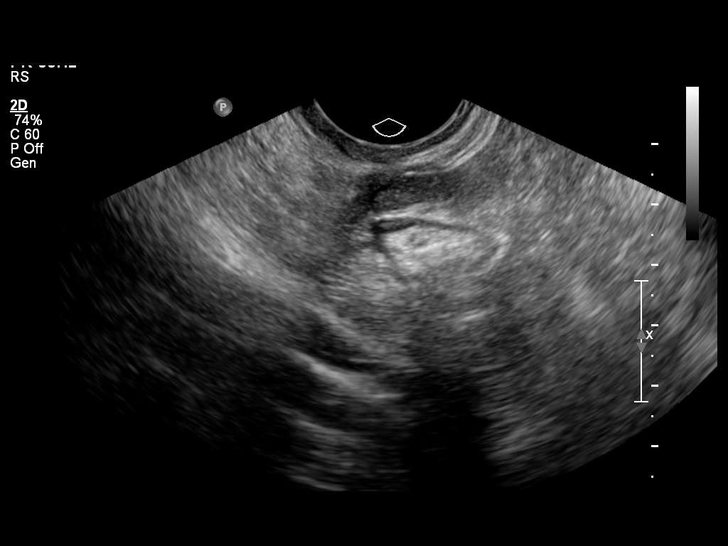
[im 28/31]
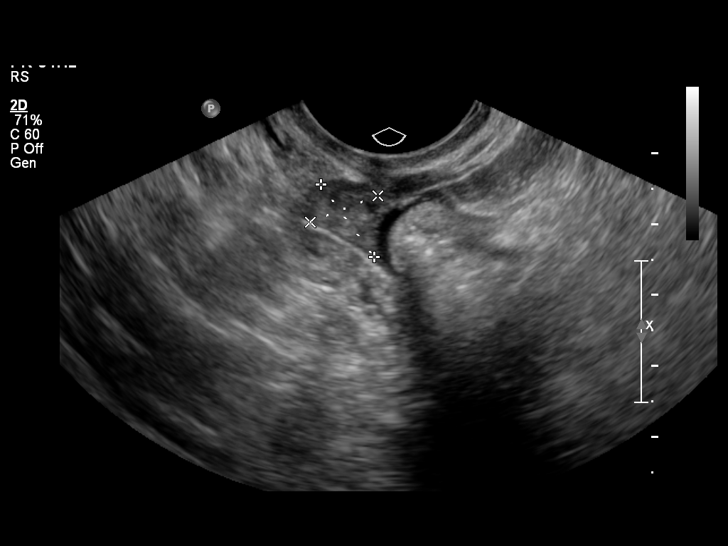
[im 31/31]
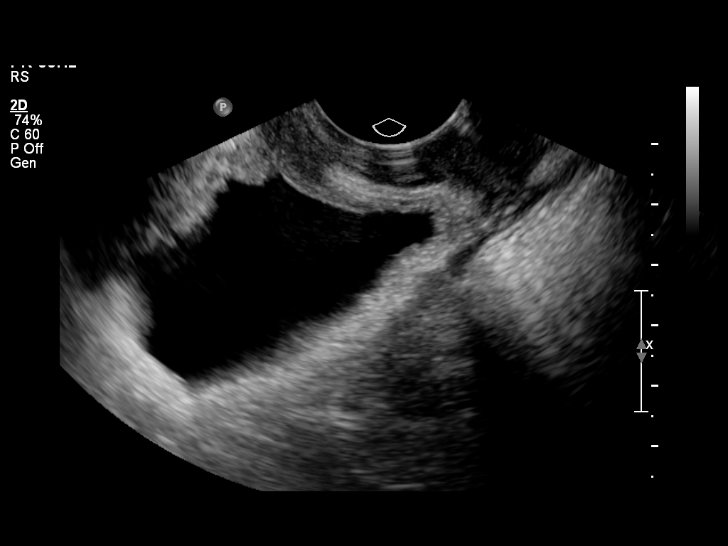

[14 of 25 positions shown; findings below may reference images not displayed]

It was necessary to proceed with endovaginal exam following the
transabdominal exam to visualize the the ovaries.
FINDINGS: Uterus: Is surgically absent

Endometrium: Is surgically absent.

Right ovary:  Normal appearance/no adnexal mass.  Measures 1.3 x
1.0 x 0.9 cm.

Left ovary: Normal appearance/no adnexal mass.  Measures 1.7 x
x 1.1 cm.

Other findings: No free fluid
IMPRESSION: No evidence of pelvic mass or other significant abnormality.

## 2011-02-11 ENCOUNTER — Other Ambulatory Visit: Payer: Self-pay | Admitting: *Deleted

## 2011-02-12 ENCOUNTER — Encounter (HOSPITAL_BASED_OUTPATIENT_CLINIC_OR_DEPARTMENT_OTHER)
Admission: RE | Admit: 2011-02-12 | Discharge: 2011-02-12 | Disposition: A | Discharge status: Home / Self Care | Payer: BC Managed Care – PPO | Source: Ambulatory Visit | Attending: Orthopedic Surgery | Admitting: Orthopedic Surgery

## 2011-02-12 LAB — BASIC METABOLIC PANEL
CO2: 29 mEq/L (ref 19–32)
Calcium: 10 mg/dL (ref 8.4–10.5)
Creatinine, Ser: 0.78 mg/dL (ref 0.50–1.10)
GFR calc non Af Amer: >60 mL/min (ref >60)
Glucose, Bld: 115 mg/dL — ABNORMAL HIGH (ref 70–99)

## 2011-02-17 ENCOUNTER — Ambulatory Visit (HOSPITAL_BASED_OUTPATIENT_CLINIC_OR_DEPARTMENT_OTHER)
Admission: RE | Admit: 2011-02-17 | Discharge: 2011-02-17 | Disposition: A | Discharge status: Home / Self Care | Payer: BC Managed Care – PPO | Source: Ambulatory Visit | Attending: Orthopedic Surgery | Admitting: Orthopedic Surgery

## 2011-02-17 DIAGNOSIS — Z01812 Encounter for preprocedural laboratory examination: Secondary | ICD-10-CM | POA: Insufficient documentation

## 2011-02-17 DIAGNOSIS — M65839 Other synovitis and tenosynovitis, unspecified forearm: Secondary | ICD-10-CM | POA: Insufficient documentation

## 2011-02-17 DIAGNOSIS — M65849 Other synovitis and tenosynovitis, unspecified hand: Secondary | ICD-10-CM | POA: Insufficient documentation

## 2011-02-17 DIAGNOSIS — M6530 Trigger finger, unspecified finger: Secondary | ICD-10-CM | POA: Insufficient documentation

## 2011-02-17 DIAGNOSIS — M6740 Ganglion, unspecified site: Secondary | ICD-10-CM | POA: Insufficient documentation

## 2011-02-17 HISTORY — PX: OTHER SURGICAL HISTORY: SHX169

## 2011-02-18 ENCOUNTER — Other Ambulatory Visit: Payer: Self-pay | Admitting: Orthopedic Surgery

## 2011-02-18 LAB — POCT HEMOGLOBIN-HEMACUE: Hemoglobin: 13.3 g/dL (ref 12.0–15.0)

## 2011-02-22 ENCOUNTER — Other Ambulatory Visit: Payer: Self-pay | Admitting: *Deleted

## 2011-02-22 MED ORDER — LANSOPRAZOLE 30 MG PO CPDR: 30.0000 mg | DELAYED_RELEASE_CAPSULE | Freq: Every day | ORAL | Status: DC

## 2011-02-22 NOTE — Telephone Encounter (Signed)
Okay to refill? It wasn't on med list, so I added it.

## 2011-02-23 ENCOUNTER — Other Ambulatory Visit: Payer: Self-pay | Admitting: *Deleted

## 2011-02-23 MED ORDER — LANSOPRAZOLE 30 MG PO CPDR: 30.0000 mg | DELAYED_RELEASE_CAPSULE | Freq: Every day | ORAL | Status: DC

## 2011-02-25 ENCOUNTER — Other Ambulatory Visit: Payer: Self-pay | Admitting: *Deleted

## 2011-02-25 MED ORDER — LANSOPRAZOLE 30 MG PO CPDR: 30.0000 mg | DELAYED_RELEASE_CAPSULE | Freq: Every day | ORAL | Status: DC

## 2011-03-09 NOTE — Op Note (Signed)
NAMESTELLAH, [name]                ACCOUNT NO.:  [id number]  MEDICAL RECORD NO.:  [id number]  LOCATION:                                 FACILITY:  PHYSICIAN:  Cindee Salt, M.D.       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  OPERATION:  Release A1 pulley left thumb, left ring finger, excision mucoid tumor, debridement distal interphalangeal joint left ring finger.  SURGEON:  Cindee Salt, MD  ANESTHESIA:  Forearm based IV regional with local infiltration metacarpal block.  ANESTHESIOLOGIST:  Janetta Hora. Frederick, MD  HISTORY:  The patient is a 60 year old female with history of a mucoid cyst recurrent over her distal interphalangeal joint left ring finger. She also has triggering of the left thumb, left ring finger.  She has elected to undergo surgical excision of the cyst, debridement of distal interphalangeal joint with release of the A1 pulleys of the left thumb, left ring finger.  Pre, peri and postoperative course have been discussed along with risks and complications.  She is aware there is no guarantee with surgery, possibility of infection, recurrence injury to arteries, nerves, tendons, incomplete relief of symptoms and dystrophy, recurrence of the cyst, stiffness of the joint, and continuation of degenerative arthritis.  Preoperative area, the patient is seen.  The extremity marked by both the patient and surgeon.  Antibiotic given.  PROCEDURE:  The patient was brought to the operating room where a forearm based IV regional  anesthetic was carried out without difficulty. She was prepped using ChloraPrep, supine position, left arm free.  A 3- minute dry time was allowed.  Time-out taken confirming the patient and procedure.  An oblique incision was made over the A1 pulley of the left ring finger and carried down through subcutaneous tissue.  Bleeders were electrocauterized with bipolar.  A1 pulley was identified and released in its radial aspect.  A small incision made centrally in A2.  Finger placed through a full range of motion, no further triggering was noted. The wound was irrigated and closed with interrupted 5-0 Vicryl Rapide. A separate incision was then made transversely over the A1 pulley of the left thumb, carried down through subcutaneous tissue.  Neurovascular structures identified and protected with retractors.  An incision was made on the radial aspect of the A1 pulley.  The oblique pulley was protected and left intact.  Again, a partial tenosynovectomy performed. The thumb placed in full range of motion, no further triggering was noted.  The wound was irrigated and closed with interrupted 5-0 Vicryl Rapide sutures.  Local infiltration was given with 0.25% Marcaine without epinephrine along with a digital block to the ring finger.  A separate incision was then made dorsally over the distal interphalangeal joint cyst curvilinear in nature, carried down through subcutaneous tissue.  The cyst was  immediately encountered and moderate scarring was present around it.  With sharp dissection, this was excised along with a more proximal cyst.  The radial aspect of the joint was opened.  A small rongeur was then used to debride the distal interphalangeal joint along with a synovectomy.  Specimen was sent to pathology.  The wound was copiously irrigated with saline and closed with interrupted 5-0 Vicryl Rapide sutures.  Sterile compressive dressing to the hand with fingers free was applied except for the  ring finger which was splinted along with a dressing splinting only to the distal interphalangeal joint. Deflation of the tourniquet, all fingers immediately pinked.  The patient tolerated the procedure well and was taken to the recovery room for observation in satisfactory condition.  She will be discharged home to return to the [facility] [place] in 1 week on Vicodin.          ______________________________ Cindee Salt, M.D.     GK/MEDQ  D:  02/17/2011  T:  02/18/2011  Job:  [id number]  cc:   Kerby Nora, MD  Electronically Signed by Cindee Salt M.D. on 03/09/2011 04:40:05 PM

## 2011-03-22 ENCOUNTER — Encounter (HOSPITAL_BASED_OUTPATIENT_CLINIC_OR_DEPARTMENT_OTHER)
Admission: RE | Admit: 2011-03-22 | Discharge: 2011-03-22 | Disposition: A | Discharge status: Home / Self Care | Payer: BC Managed Care – PPO | Source: Ambulatory Visit | Attending: Orthopedic Surgery | Admitting: Orthopedic Surgery

## 2011-03-22 LAB — BASIC METABOLIC PANEL
CO2: 28 mEq/L (ref 19–32)
Calcium: 9.6 mg/dL (ref 8.4–10.5)
Creatinine, Ser: 0.66 mg/dL (ref 0.50–1.10)
GFR calc non Af Amer: >90 mL/min (ref >90)
Glucose, Bld: 85 mg/dL (ref 70–99)

## 2011-03-23 ENCOUNTER — Ambulatory Visit (HOSPITAL_BASED_OUTPATIENT_CLINIC_OR_DEPARTMENT_OTHER)
Admission: RE | Admit: 2011-03-23 | Discharge: 2011-03-23 | Disposition: A | Discharge status: Home / Self Care | Payer: BC Managed Care – PPO | Source: Ambulatory Visit | Attending: Orthopedic Surgery | Admitting: Orthopedic Surgery

## 2011-03-23 DIAGNOSIS — I10 Essential (primary) hypertension: Secondary | ICD-10-CM | POA: Insufficient documentation

## 2011-03-23 DIAGNOSIS — Z01812 Encounter for preprocedural laboratory examination: Secondary | ICD-10-CM | POA: Insufficient documentation

## 2011-03-23 DIAGNOSIS — M6530 Trigger finger, unspecified finger: Secondary | ICD-10-CM | POA: Insufficient documentation

## 2011-03-23 DIAGNOSIS — F17200 Nicotine dependence, unspecified, uncomplicated: Secondary | ICD-10-CM | POA: Insufficient documentation

## 2011-03-23 DIAGNOSIS — M65849 Other synovitis and tenosynovitis, unspecified hand: Secondary | ICD-10-CM | POA: Insufficient documentation

## 2011-03-23 DIAGNOSIS — M65839 Other synovitis and tenosynovitis, unspecified forearm: Secondary | ICD-10-CM | POA: Insufficient documentation

## 2011-03-23 DIAGNOSIS — K219 Gastro-esophageal reflux disease without esophagitis: Secondary | ICD-10-CM | POA: Insufficient documentation

## 2011-03-23 HISTORY — PX: OTHER SURGICAL HISTORY: SHX169

## 2011-04-09 NOTE — Op Note (Signed)
  NAMESHACOYA, BURKHAMMER                ACCOUNT NO.:  [id number]  MEDICAL RECORD NO.:  [id number]  LOCATION:                                 FACILITY:  PHYSICIAN:  Cindee Salt, M.D.       DATE OF BIRTH:  [birth year]  DATE OF PROCEDURE:  03/23/2011 DATE OF DISCHARGE:                              OPERATIVE REPORT   PREOPERATIVE DIAGNOSIS:  Stenosing tenosynovitis, right thumb.  POSTOPERATIVE DIAGNOSIS:  Stenosing tenosynovitis, right thumb.  OPERATION:  Release of A1 pulley, right thumb.  SURGEON:  Cindee Salt, MD  ASSISTANT:  Betha Loa, MD  ANESTHESIA:  Forearm-based IV regional local infiltration.  ANESTHESIOLOGIST:  Janetta Hora. Gelene Mink, MD  HISTORY:  The patient is a 60 year old female with a history of triggering of her right thumb.  This did not responded to conservative treatment.  She has elected to undergo surgical release. Pre, peri, and postoperative courses have been discussed along with risks and complications.  She is aware that there is no guarantee with surgery, possibility of infection, recurrence; injury to arteries, nerves, tendons; incomplete relief of symptoms, dystrophy.  In the preoperative area, the patient is seen, the extremity marked by both the patient and surgeon, antibiotic given.  PROCEDURE:  The patient was brought to the operating room where a forearm-based IV regional anesthetic was carried out without difficulty. She was prepped using ChloraPrep, supine position, right arm free.  A 3- minute dry time was allowed.  Time-out taken, confirming the patient and procedure.  A transverse incision was made over the A1 pulley of the right thumb, carried down through subcutaneous tissue.  The A1 pulley was identified on its radial aspect.  An incision made.  The tenosynovial tissue was markedly adherent.  This was released proximally with blunt dissection.  The thumb placed through a full range motion, no further triggering was evident.  The wound was  irrigated.  Skin closed with interrupted 5-0 Vicryl Rapide sutures.  A sterile compressive dressing was applied after injection of the area with 0.25% Marcaine without epinephrine, 5 mL was used.  On deflation of the tourniquet, all fingers immediately pinked. She was taken to the recovery room for observation in satisfactory condition.  She will be discharged home to return to the [facility] [place] in 1 week on Vicodin.          ______________________________ Cindee Salt, M.D.     GK/MEDQ  D:  03/23/2011  T:  03/23/2011  Job:  [id number]  Electronically Signed by Cindee Salt M.D. on 04/09/2011 09:09:02 AM

## 2011-04-16 ENCOUNTER — Encounter: Payer: Self-pay | Admitting: Gynecology

## 2011-04-16 ENCOUNTER — Ambulatory Visit: Payer: BC Managed Care – PPO | Attending: Gynecology | Admitting: Gynecology

## 2011-04-16 DIAGNOSIS — Z87891 Personal history of nicotine dependence: Secondary | ICD-10-CM | POA: Insufficient documentation

## 2011-04-16 DIAGNOSIS — C519 Malignant neoplasm of vulva, unspecified: Secondary | ICD-10-CM

## 2011-04-16 DIAGNOSIS — I10 Essential (primary) hypertension: Secondary | ICD-10-CM | POA: Insufficient documentation

## 2011-04-16 DIAGNOSIS — M129 Arthropathy, unspecified: Secondary | ICD-10-CM | POA: Insufficient documentation

## 2011-04-16 DIAGNOSIS — K219 Gastro-esophageal reflux disease without esophagitis: Secondary | ICD-10-CM | POA: Insufficient documentation

## 2011-04-16 DIAGNOSIS — N9089 Other specified noninflammatory disorders of vulva and perineum: Secondary | ICD-10-CM | POA: Insufficient documentation

## 2011-04-16 DIAGNOSIS — Z79899 Other long term (current) drug therapy: Secondary | ICD-10-CM | POA: Insufficient documentation

## 2011-04-16 DIAGNOSIS — C4499 Other specified malignant neoplasm of skin, unspecified: Secondary | ICD-10-CM

## 2011-04-16 NOTE — Progress Notes (Signed)
Consult Note: Gyn-Onc  [name] 60 y.o. female  CC:  Chief Complaint  Patient presents with  . Follow-up    Paget disease of the vulva    HPI: Patient initially underwent surgery for Paget's disease of vulva in October 2011. There was focal involvement of the medial margin. The entire lesion is approximately 5 cm in diameter.  Interval History: The patient returns today for previously scheduled appointment. About a week ago she noted a very small lump on her right vulva. This is not painful. Otherwise she is notable for pruritus and has not had any bleeding functional status is excellent. She has had a number of other medical procedures and surgical procedures on her hand. She apparently had some abdominal distention and underwent abdominal ultrasound and a CA 125 by another physician that were all normal.  Review of Systems: 10 point review of systems negative  Current Meds:  Outpatient Encounter Prescriptions as of 04/16/2011  Medication Sig Dispense Refill  . acetaminophen (TYLENOL) 325 MG tablet Take 650 mg by mouth every 6 (six) hours as needed.        Marland Kitchen albuterol (PROVENTIL HFA;VENTOLIN HFA) 108 (90 BASE) MCG/ACT inhaler Inhale 2 puffs into the lungs as needed.        . ciclesonide (ALVESCO) 160 MCG/ACT inhaler Inhale 2 puffs into the lungs daily.        . clobetasol (TEMOVATE) 0.05 % cream Apply 0.05 application topically as needed.       . fluticasone (FLONASE) 50 MCG/ACT nasal spray Place 2 sprays into the nose daily.        . lansoprazole (PREVACID) 30 MG capsule Take 1 capsule (30 mg total) by mouth daily.  90 capsule  3  . loratadine (CLARITIN) 10 MG tablet Take 10 mg by mouth daily.        Marland Kitchen losartan (COZAAR) 50 MG tablet Take 1 tablet (50 mg total) by mouth daily.  30 tablet  11  . metoprolol tartrate (LOPRESSOR) 25 MG tablet Take 25 mg by mouth 2 (two) times daily.        . SUMAtriptan (IMITREX) 50 MG tablet Take 50 mg by mouth every 2 (two) hours as needed.        Marland Kitchen  econazole nitrate 1 % cream Apply 1 % topically daily.        . mupirocin (BACTROBAN) 2 % ointment Apply topically 3 (three) times daily.        Marland Kitchen omeprazole (PRILOSEC) 20 MG capsule Take 20 mg by mouth daily.          Allergy:  Allergies  Allergen Reactions  . Azithromycin     REACTION: rash and pruritis  . Other     Allergic to toothpaste- makes mouth peel Hay fever/dust mites/trees "365 allergic"   . Sulfa Antibiotics   . Sulfonamide Derivatives     REACTION: Blotches    Social Hx:   History   Social History  . Marital Status: Married    Spouse Name: N/A    Number of Children: N/A  . Years of Education: N/A   Occupational History  . Not on file.   Social History Main Topics  . Smoking status: Former Smoker    Quit date: 04/16/1991  . Smokeless tobacco: Not on file  . Alcohol Use: No  . Drug Use: No  . Sexually Active: Not on file   Other Topics Concern  . Not on file   Social History Narrative  . No  narrative on file    Past Surgical Hx:  Past Surgical History  Procedure Date  . Vaginal hysterectomy 1989    for prolapse  . Anterior and posterior repair 1989  . Simple vulvectomy 2011    right side  . Tonsilectomy, adenoidectomy, bilateral myringotomy and tubes   . Wisdom tooth extraction   . Hand surgery 2012    Left hand, ring finger cyst removal, trigger finger surg x 3 fingers    Past Medical Hx:  Past Medical History  Diagnosis Date  . 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 S/P partial hysterectomy   . Hypertension   . Headache     Migraines  . GERD (gastroesophageal reflux disease)   . GERD (gastroesophageal reflux disease)   . Arthritis   . Paget's disease of vulva   . Multiple allergies   . Vulvar mass 04/16/2011    Family Hx:  Family History  Problem Relation Age of Onset  . Cancer Maternal Grandmother   . Hypertension Father   . Heart disease Father   . Hypertension Mother   . Heart disease Mother   . Cancer  Paternal Grandmother     breast cancer  . Colon cancer Paternal Uncle     Vitals:  Blood pressure 120/72, pulse 68, temperature 98.3 F (36.8 C), temperature source Oral, resp. rate 18, height 5\' 4"  (1.626 m), weight 181 lb 8 oz (82.328 kg).  Physical Exam: This is a well-developed white female no acute distress HEENT is negative neck supple thyromegaly  There's a supraventricular angle adenopathy.  The abdomen is soft nontender no mass again it was decided hernias noted.  Pelvic exam EGBUS appears normal there is no evidence of Paget's disease. The patient identifies the area of the "lump". His in the midportion of the right labia majora that measured approximately 5 mm. It is not able to be visualized on palpation is in the subcutaneous tissue most likely a epidural inclusion cyst.  Assessment/Plan: Paget's disease of the vulva no evidence recurrent disease  Inclusion cyst of the right labia majora.  The patient will return to see me in 6 months. Should continue to monitor the sebaceous inclusion cyst and it were to become painful, infected, or larger she'll contact me to be seen sooner. All of her questions are answered.   Jeannette Corpus, MD 04/16/2011, 4:22 PM

## 2011-04-16 NOTE — Patient Instructions (Signed)
Return in 6 months

## 2011-04-17 ENCOUNTER — Other Ambulatory Visit: Payer: Self-pay | Admitting: Family Medicine

## 2011-04-23 ENCOUNTER — Ambulatory Visit: Payer: BC Managed Care – PPO | Admitting: Gynecology

## 2011-06-15 ENCOUNTER — Ambulatory Visit (INDEPENDENT_AMBULATORY_CARE_PROVIDER_SITE_OTHER): Payer: BC Managed Care – PPO | Admitting: Family Medicine

## 2011-06-15 ENCOUNTER — Encounter: Payer: Self-pay | Admitting: Family Medicine

## 2011-06-15 VITALS — BP 130/82 | HR 55 | Temp 98.4°F | Ht 64.0 in | Wt 180.8 lb

## 2011-06-15 DIAGNOSIS — Z0000 Encounter for general adult medical examination without abnormal findings: Secondary | ICD-10-CM

## 2011-06-15 DIAGNOSIS — I10 Essential (primary) hypertension: Secondary | ICD-10-CM

## 2011-06-15 DIAGNOSIS — Z1231 Encounter for screening mammogram for malignant neoplasm of breast: Secondary | ICD-10-CM

## 2011-06-15 MED ORDER — METOPROLOL TARTRATE 25 MG PO TABS: 25.0000 mg | ORAL_TABLET | Freq: Two times a day (BID) | ORAL | Status: DC

## 2011-06-15 MED ORDER — LANSOPRAZOLE 30 MG PO CPDR: 30.0000 mg | DELAYED_RELEASE_CAPSULE | Freq: Every day | ORAL | Status: DC

## 2011-06-15 MED ORDER — LOSARTAN POTASSIUM 50 MG PO TABS: 50.0000 mg | ORAL_TABLET | Freq: Every day | ORAL | Status: DC

## 2011-06-15 MED ORDER — CYCLOBENZAPRINE HCL 10 MG PO TABS: 10.0000 mg | ORAL_TABLET | Freq: Every evening | ORAL | Status: AC | PRN

## 2011-06-15 NOTE — Patient Instructions (Addendum)
Stop at front to speak with Shirlee Limerick for mammogram in 09/2010. Work on regular exercise and weight loss. Follow up in 6 months HTN. Follow BPs at home.. Call if persistantly greater than 140/90.  Try muscle relaxant for back spasm... ? May help with throat spasming.

## 2011-06-15 NOTE — Progress Notes (Signed)
Subjective:    Patient ID: [name], female    DOB: 02/01/1951, 61 y.o.   MRN: [id number]  HPI 61 year old female presents for wellness exam.  Saw GYN (Dr. Loree Fee) for Paget's disease of vulva, s/p excision in 2011... No recurrence.  Hypertension:  Well controlled on losartan and lopressor.  Using medication without problems or lightheadedness:  None Chest pain with exertion: None Edema:None Short of breath: Occ Average home BPs: usually well controlled, occ spikes diastolic >90. Other issues: She is working on exercise and weight loss.  Allergic rhinitits: Seeing allergist for multiple allergies: fluticasone nasal, alvesco, claritin. Has helped with hoarseness, and SOB.  Lipids and DM screen nml in 12/2010.   Review of Systems  Constitutional: Negative for fever, fatigue and unexpected weight change.  HENT: Negative for ear pain, congestion, sore throat, sneezing, trouble swallowing and sinus pressure.   Eyes: Negative for pain and itching.  Respiratory: Positive for shortness of breath. Negative for cough and wheezing.        Occ hiccup/ air stops  when taking deep breaths/talking ongoing for 1 year, daily  Cardiovascular: Negative for chest pain, palpitations and leg swelling.  Gastrointestinal: Negative for nausea, abdominal pain, diarrhea, constipation and blood in stool.  Genitourinary: Negative for dysuria, hematuria, vaginal discharge, difficulty urinating and menstrual problem.  Musculoskeletal: Positive for back pain and arthralgias.       Seeing chiropractor  Skin: Negative for rash.  Neurological: Negative for syncope, weakness, light-headedness, numbness and headaches.  Psychiatric/Behavioral: Negative for confusion and dysphoric mood. The patient is not nervous/anxious.        Objective:   Physical Exam  Constitutional: Vital signs are normal. She appears well-developed and well-nourished. She is cooperative.  Non-toxic appearance. She does not appear  ill. No distress.  HENT:  Head: Normocephalic.  Right Ear: Hearing, tympanic membrane, external ear and ear canal normal.  Left Ear: Hearing, tympanic membrane, external ear and ear canal normal.  Nose: Nose normal.  Eyes: Conjunctivae, EOM and lids are normal. Pupils are equal, round, and reactive to light. No foreign bodies found.  Neck: Trachea normal and normal range of motion. Neck supple. Carotid bruit is not present. No mass and no thyromegaly present.  Cardiovascular: Normal rate, regular rhythm, S1 normal, S2 normal, normal heart sounds and intact distal pulses.  Exam reveals no gallop.   No murmur heard. Pulmonary/Chest: Effort normal and breath sounds normal. No respiratory distress. She has no wheezes. She has no rhonchi. She has no rales.  Abdominal: Soft. Normal appearance and bowel sounds are normal. She exhibits no distension, no fluid wave, no abdominal bruit and no mass. There is no hepatosplenomegaly. There is no tenderness. There is no rebound, no guarding and no CVA tenderness. No hernia.  Genitourinary: No breast swelling, tenderness, discharge or bleeding. Pelvic exam was performed with patient prone.  Lymphadenopathy:    She has no cervical adenopathy.    She has no axillary adenopathy.  Neurological: She is alert. She has normal strength. No cranial nerve deficit or sensory deficit.  Skin: Skin is warm, dry and intact. No rash noted.  Psychiatric: Her speech is normal and behavior is normal. Judgment normal. Her mood appears not anxious. Cognition and memory are normal. She does not exhibit a depressed mood.          Assessment & Plan:  CPX: The patient's preventative maintenance and recommended screening tests for an annual wellness exam were reviewed in full today. Brought up to  date unless services declined.  Counselled on the importance of diet, exercise, and its role in overall health and mortality. The patient's FH and SH was reviewed, including their home  life, tobacco status, and drug and alcohol status.   Vaccines: Uptodate with Td, flu refused PAP/DVE: No pap indicated due to vag hysterectomy, has ovaries (had CA125, [place] nml last year (12/2010), Dr. Darrall Dears did DVE). Mammo: 09/2010, nml, was having discharge/pain, drinks some caffeine. No further discharge, no blood.Marland Kitchenoccasionally dry flake at nipples. Colon: nml 2011 Dr. Leone Payor, repeat in 5 years.

## 2011-07-05 NOTE — Assessment & Plan Note (Signed)
Well controlled. Continue current medication.  

## 2011-07-28 ENCOUNTER — Telehealth: Payer: Self-pay | Admitting: Family Medicine

## 2011-07-28 MED ORDER — METOPROLOL TARTRATE 25 MG PO TABS: 25.0000 mg | ORAL_TABLET | Freq: Two times a day (BID) | ORAL | Status: DC

## 2011-07-28 MED ORDER — LANSOPRAZOLE 30 MG PO CPDR: 30.0000 mg | DELAYED_RELEASE_CAPSULE | Freq: Every day | ORAL | Status: DC

## 2011-07-28 NOTE — Telephone Encounter (Signed)
Triage Record Num: [id number] Operator: Revonda Humphrey Patient Name: [name] Call Date & Time: 07/28/2011 9:14:44AM Patient Phone: [phone] PCP: Kerby Nora Patient Gender: Female PCP Fax : [phone] Patient DOB: [birth year] Practice Name: [facility] Reason for Call: Caller: [name]/Patient; PCP: Excell Seltzer.; CB#: [phone]; ; ; Call regarding High Blood Pressure; Higher BP readings for 2 days, yesterday pm 167/97. BP 137/87 at HS last night 2/19 after meds. BP 135/83 this am after am meds. Has been exercising regularily on eliptical machine. Left lower leg pain the last 2 nights thinks due to muscle cramping. Transferred to office for appt eval BP Protocol(s) Used: Hypertension, Diagnosed or Suspected Recommended Outcome per Protocol: See Provider within 72 Hours Reason for Outcome: Multiple elevated blood pressure readings without other symptoms AND no previous work-up OR readings exceed expected range defined by treatment plan Care Advice: ~ 07/28/2011 9:36:02AM Page 1 of 1 [email or web address]

## 2011-07-28 NOTE — Telephone Encounter (Signed)
[address] [place], [place] [address] p. [phone] f. [phone] To: [facility] (Daytime Triage) Fax: [phone] From: [facility] Date/ Time: 07/27/2011 5:11 PM Taken By: Peri Jefferson, RN Caller: Lupita Leash Facility: not collected Patient: Rockelle, Heuerman DOB: [birth year] Phone: [phone] Reason for Call: Matie is requesting a refill of her Metoprolol Tartrate and Lansoprazole to be sent to [facility], NOT [facility]. Regarding Appointment: Appt Date: Appt Time: Unknown Provider: Reason: Details: Outcome:

## 2011-07-28 NOTE — Telephone Encounter (Signed)
Triage Record Num: [id number] Operator: Peri Jefferson Patient Name: [name] Call Date & Time: 07/27/2011 4:58:51PM Patient Phone: [phone] PCP: Kerby Nora Patient Gender: Female PCP Fax : [phone] Patient DOB: 1950/08/08 Practice Name[facility] Reason for Call: Caller: Velvet/Patient; PCP: Excell Seltzer.; CB#: [phone]; Call regarding Pain in Leg and BP Is Elevated; Daryle states that she woke up with severe lower left leg pain last night (07/26/11). Has not had any leg pain today. Also states that her BP is elevated. BP 167/97. Utilized Hypertension Guideline. See PCP within 72 hrs disposition. Advised to call for appt. Parameters reviewed concerning when to call back. Also states that she needs a refill of her Metorpolol Tartrate and Lansoprazole to be sent to [facility], Not [facility]. Sent note to office regarding scripts. Protocol(s) Used: Hypertension, Diagnosed or Suspected Recommended Outcome per Protocol: See Provider within 72 Hours Reason for Outcome: Multiple elevated blood pressure readings without other symptoms AND no previous work-up OR readings exceed expected range defined by treatment plan Care Advice: Call EMS 911 if new symptoms develop, such as severe shortness of breath, chest pain, change in mental status, acute neurologic deficit, seizure, visual disturbances, pulse rate > 120 / minute, or very irregular pulse. ~ ~ Call provider if systolic BP is 180 or greater, or if diastolic BP is 120 or greater. Avoid the use of stimulants including caffeine (coffee, some soft drinks, some energy drinks, tea and chocolate), cocaine, and amphetamines. Also avoid drinking alcohol. ~ 07/27/2011 5:12:51PM Page 1 of 1 [email or web address]

## 2011-07-28 NOTE — Telephone Encounter (Signed)
Please help with both and f/u ov

## 2011-07-28 NOTE — Telephone Encounter (Signed)
Patient already has follow up and rx sent in

## 2011-07-29 ENCOUNTER — Encounter: Payer: Self-pay | Admitting: Family Medicine

## 2011-07-29 ENCOUNTER — Ambulatory Visit (INDEPENDENT_AMBULATORY_CARE_PROVIDER_SITE_OTHER): Payer: BC Managed Care – PPO | Admitting: Family Medicine

## 2011-07-29 VITALS — BP 140/92 | HR 55 | Temp 98.1°F | Ht 64.0 in | Wt 175.8 lb

## 2011-07-29 DIAGNOSIS — I10 Essential (primary) hypertension: Secondary | ICD-10-CM

## 2011-07-29 MED ORDER — LOSARTAN POTASSIUM 100 MG PO TABS: 100.0000 mg | ORAL_TABLET | Freq: Every day | ORAL | Status: DC

## 2011-07-29 NOTE — Patient Instructions (Addendum)
Write down BP every day  Recheck in 3-4 weeks

## 2011-07-29 NOTE — Progress Notes (Signed)
  Patient Name: [name] Date of Birth: 1950/10/07 Age: 61 y.o. Medical Record Number: [id number] Gender: female Date of Encounter: 07/29/2011  History of Present Illness:  [name] is a 61 y.o. very pleasant female patient who presents with the following:  HTN, unstable: Was 197/94 the other day. Lower number in the 90's Last night, felt like her BP was high.  Past Medical History, Surgical History, Social History, Family History, Problem List, Medications, and Allergies have been reviewed and updated if relevant.  Review of Systems:  GEN: No acute illnesses, no fevers, chills. Recent HA when bp up GI: No n/v/d, eating normally Pulm: No SOB Interactive and getting along well at home.  Otherwise, ROS is as per the HPI.   Physical Examination: Filed Vitals:   07/29/11 1009  BP: 140/92  Pulse: 55  Temp: 98.1 F (36.7 C)  TempSrc: Oral  Height: 5\' 4"  (1.626 m)  Weight: 175 lb 12.8 oz (79.742 kg)  SpO2: 99%    Body mass index is 30.18 kg/(m^2).   GEN: WDWN, NAD, Non-toxic, A & O x 3 HEENT: Atraumatic, Normocephalic. Neck supple. No masses, No LAD. Ears and Nose: No external deformity. CV: RRR, No M/G/R. No JVD. No thrill. No extra heart sounds. PULM: CTA B, no wheezes, crackles, rhonchi. No retractions. No resp. distress. No accessory muscle use. EXTR: No c/c/e NEURO Normal gait.  PSYCH: Normally interactive. Conversant. Not depressed or anxious appearing.  Calm demeanor.    Assessment and Plan: 1. HTN (hypertension)  losartan (COZAAR) 100 MG tablet    incr cozaar, f/u 1 mo

## 2011-08-10 ENCOUNTER — Emergency Department: Payer: Self-pay | Admitting: Emergency Medicine

## 2011-08-10 ENCOUNTER — Telehealth: Payer: Self-pay | Admitting: *Deleted

## 2011-08-10 ENCOUNTER — Telehealth: Payer: Self-pay | Admitting: Family Medicine

## 2011-08-10 ENCOUNTER — Ambulatory Visit: Payer: BC Managed Care – PPO | Admitting: Family Medicine

## 2011-08-10 LAB — BASIC METABOLIC PANEL
Anion Gap: 10 (ref 7–16)
BUN: 10 mg/dL (ref 7–18)
Chloride: 107 mmol/L (ref 98–107)
Co2: 26 mmol/L (ref 21–32)
EGFR (Non-African Amer.): >60
Potassium: 4.4 mmol/L (ref 3.5–5.1)

## 2011-08-10 LAB — CBC
HGB: 12.3 g/dL (ref 12.0–16.0)
MCH: 31.6 pg (ref 26.0–34.0)
MCHC: 33.7 g/dL (ref 32.0–36.0)
MCV: 94 fL (ref 80–100)

## 2011-08-10 IMAGING — CR DG CHEST 1V PORT
1 series · 1 of 1 positions shown · non-contrast
Comparison: none

REASON FOR EXAM: Chest Pain
COMMENTS:

[portable]
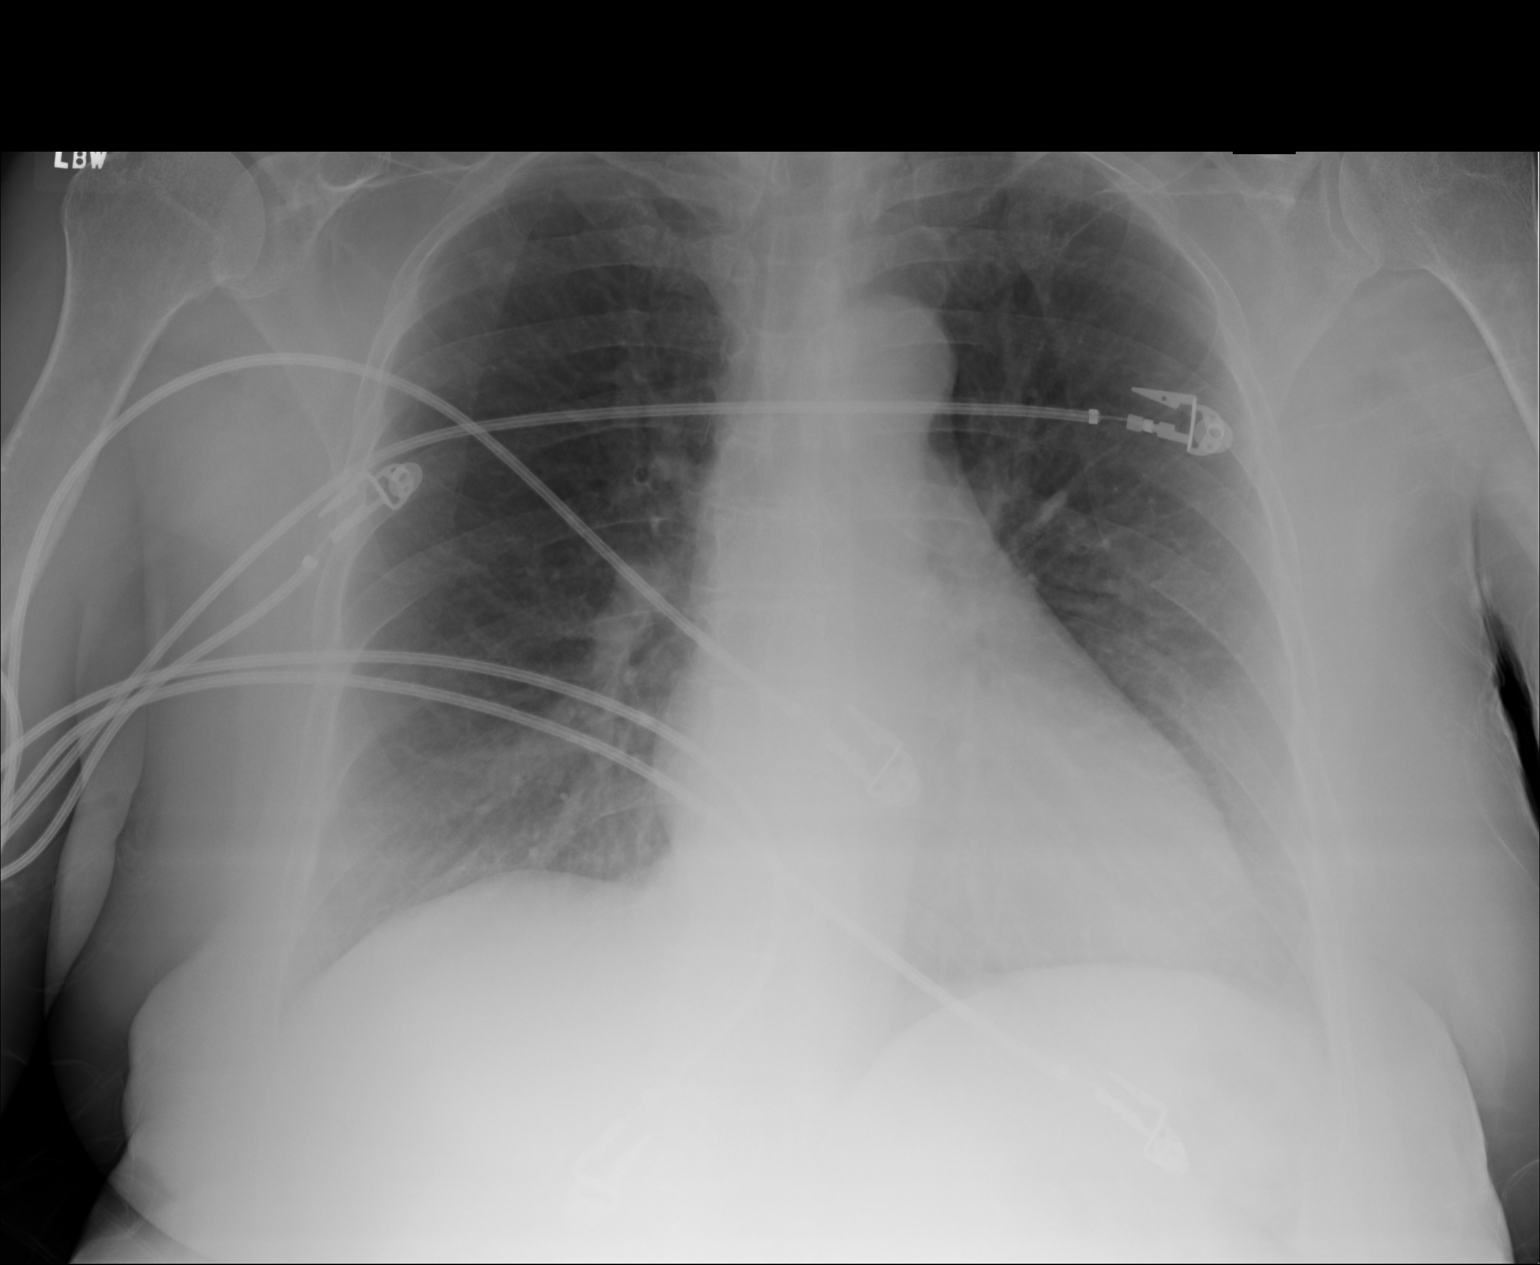

[1 of 1 positions shown; findings below may reference images not displayed]

PROCEDURE:     DXR - DXR PORTABLE CHEST SINGLE VIEW  - [DATE] [DATE]

RESULT:     There is no previous exam for comparison.

Cardiac monitoring electrodes are present. The lungs are clear. The heart
and pulmonary vessels are normal. The bony and mediastinal structures are
unremarkable. There is no effusion. There is no pneumothorax or evidence of
congestive failure.
IMPRESSION: No acute cardiopulmonary disease.

## 2011-08-10 NOTE — Telephone Encounter (Signed)
Noted  

## 2011-08-10 NOTE — Telephone Encounter (Signed)
Patient called stating that she took her BP about 10 minutes earlier and it was 175/105 and pulse 50. Patient states that she took her BP mediation Metoprolol about 2 hours ago. Patient states that she does have a little headache and vision is not exactly right. Patient took her BP again and it is now 177/99.  Please advise.

## 2011-08-10 NOTE — Telephone Encounter (Signed)
To: [facility] (Daytime Triage) Fax: [phone] From: [facility] Date/ Time: 08/10/2011 9:54 AM Taken By: Crissie Figures, CSR Caller: Lupita Leash Facility: not collected Patient: [name], [name] DOB: [birth year] Phone: [phone] Reason for Call: Calling Rene Kocher back to let her know she in going to the hospital. [facility] and her husband is going to meet her there. Regarding Appointment: Appt Date: Appt Time: Unknown Provider: Reason: Details: Outcome:

## 2011-08-10 NOTE — Telephone Encounter (Signed)
Spoke to patient and was advised that she has decided to go to [facility] to be checked out.

## 2011-08-10 NOTE — Telephone Encounter (Addendum)
Patient was advised that with symptoms like she is having it would be best for her to go to the ER. Patient stated that she is not sure if symptoms are bad enough to go to the ER and would like for a doctor to see her in the office. Patient was scheduled to be seen today. Patient stated that she will talk with her husband and may decide to go to the ER. Patient stated that she will call back and advise. Patient was informed that if symptoms changed she should go to the ER. Discuss with Dr. Para March and he agreed.

## 2011-08-11 ENCOUNTER — Telehealth: Payer: Self-pay | Admitting: Family Medicine

## 2011-08-11 ENCOUNTER — Ambulatory Visit (INDEPENDENT_AMBULATORY_CARE_PROVIDER_SITE_OTHER): Payer: BC Managed Care – PPO | Admitting: Family Medicine

## 2011-08-11 ENCOUNTER — Encounter: Payer: Self-pay | Admitting: Family Medicine

## 2011-08-11 VITALS — BP 170/100 | HR 47 | Temp 98.5°F | Ht 64.0 in | Wt 177.8 lb

## 2011-08-11 DIAGNOSIS — I498 Other specified cardiac arrhythmias: Secondary | ICD-10-CM

## 2011-08-11 DIAGNOSIS — I10 Essential (primary) hypertension: Secondary | ICD-10-CM

## 2011-08-11 DIAGNOSIS — R001 Bradycardia, unspecified: Secondary | ICD-10-CM

## 2011-08-11 MED ORDER — LOSARTAN POTASSIUM-HCTZ 100-25 MG PO TABS: 1.0000 | ORAL_TABLET | Freq: Every day | ORAL | Status: DC

## 2011-08-11 NOTE — Telephone Encounter (Signed)
Noted  

## 2011-08-11 NOTE — Telephone Encounter (Signed)
Triage Record Num: [id number] Operator: Lesli Albee Patient Name: [name] Call Date & Time: 08/10/2011 4:11:52PM Patient Phone: [phone] PCP: Kerby Nora Patient Gender: Female PCP Fax : [phone] Patient DOB: [birth year] Practice Name[facility] Reason for Call: Caller: Enrique/Patient; PCP: Excell Seltzer.; CB#: [phone]; ; ; Call regarding Blood Pressure Issues Went To Ed Today and Want To Speak To Rn About the Advice Given; Pt went to the ED/[facility] today for high BP. BP was 177/105 this am. Pt had a CXR and blood work done and sent the results to the office. Pt is to f/u at the office tomorrow. She will call in am for an appt. Protocol(s) Used: Information Only Call; No Symptom Triage (Adult) Recommended Outcome per Protocol: Provide Information or Advice Only Reason for Outcome: Requesting information regarding scheduled exam, test or procedure; no triage required. Information provided from approved resources or clinical experience. Care Advice: ~ 08/10/2011 5:04:18PM Page 1 of 1 [email or web address]

## 2011-08-11 NOTE — Progress Notes (Signed)
  Patient Name: [name] Date of Birth: August 07, 1950 Age: 61 y.o. Medical Record Number: [id number] Gender: female Date of Encounter: 08/11/2011  History of Present Illness:  [name] is a 61 y.o. very pleasant female patient who presents with the following:  The patient was just seen in the emergency department, and those records were reviewed in full including all studies and imaging studies. EKG was normal. The patient had an elevated blood pressure, and her pulses in her 40s. Today her blood pressure is 170/100. Pulse is 47. She is still having some headache.  Pulse 40's  Metoprolol Cozaar  Felt like head was tight and having some difficulty seeing. Even right now feels like some HA and blurred vision.  Had some neck and shoulder pain.  Clonidine.   Last night, did not take losartan.   Past Medical History, Surgical History, Social History, Family History, Problem List, Medications, and Allergies have been reviewed and updated if relevant.  Review of Systems:  GEN: No acute illnesses, no fevers, chills. GI: No n/v/d, eating normally Pulm: No SOB Interactive and getting along well at home.  Otherwise, ROS is as per the HPI.   Physical Examination: Filed Vitals:   08/11/11 1129  BP: 170/100  Pulse: 47  Temp: 98.5 F (36.9 C)  TempSrc: Oral  Height: 5\' 4"  (1.626 m)  Weight: 177 lb 12.8 oz (80.65 kg)  SpO2: 100%    Body mass index is 30.52 kg/(m^2).   GEN: WDWN, NAD, Non-toxic, A & O x 3 HEENT: Atraumatic, Normocephalic. Neck supple. No masses, No LAD. Ears and Nose: No external deformity. CV: RRR, No M/G/R. No JVD. No thrill. No extra heart sounds. PULM: CTA B, no wheezes, crackles, rhonchi. No retractions. No resp. distress. No accessory muscle use. EXTR: No c/c/e NEURO Normal gait.  PSYCH: Normally interactive. Conversant. Not depressed or anxious appearing.  Calm demeanor.    Assessment and Plan: 1. Unspecified essential hypertension   losartan-hydrochlorothiazide (HYZAAR) 100-25 MG per tablet  2. Bradycardia      Discontinue metoprolol, concerned that maybe this is contributing to some of her symptoms or occasional dizziness that she has had. She is bradycardic. Discontinue it and add 25 mg of lisinopril. Followup in 7-10 days.

## 2011-08-11 NOTE — Patient Instructions (Signed)
F/u 7-10 days  Write down bp twice a day

## 2011-08-12 ENCOUNTER — Ambulatory Visit: Payer: BC Managed Care – PPO | Admitting: Family Medicine

## 2011-08-14 ENCOUNTER — Encounter (HOSPITAL_COMMUNITY): Payer: Self-pay | Admitting: Emergency Medicine

## 2011-08-14 ENCOUNTER — Emergency Department (HOSPITAL_COMMUNITY)
Admission: EM | Admit: 2011-08-14 | Discharge: 2011-08-15 | Disposition: A | Discharge status: Home / Self Care | Payer: BC Managed Care – PPO | Attending: Emergency Medicine | Admitting: Emergency Medicine

## 2011-08-14 ENCOUNTER — Emergency Department (HOSPITAL_COMMUNITY): Payer: BC Managed Care – PPO

## 2011-08-14 DIAGNOSIS — R05 Cough: Secondary | ICD-10-CM | POA: Insufficient documentation

## 2011-08-14 DIAGNOSIS — R0602 Shortness of breath: Secondary | ICD-10-CM | POA: Insufficient documentation

## 2011-08-14 DIAGNOSIS — R002 Palpitations: Secondary | ICD-10-CM | POA: Insufficient documentation

## 2011-08-14 DIAGNOSIS — I10 Essential (primary) hypertension: Secondary | ICD-10-CM | POA: Insufficient documentation

## 2011-08-14 DIAGNOSIS — R0989 Other specified symptoms and signs involving the circulatory and respiratory systems: Secondary | ICD-10-CM | POA: Insufficient documentation

## 2011-08-14 DIAGNOSIS — J3489 Other specified disorders of nose and nasal sinuses: Secondary | ICD-10-CM | POA: Insufficient documentation

## 2011-08-14 DIAGNOSIS — M129 Arthropathy, unspecified: Secondary | ICD-10-CM | POA: Insufficient documentation

## 2011-08-14 DIAGNOSIS — R5383 Other fatigue: Secondary | ICD-10-CM | POA: Insufficient documentation

## 2011-08-14 DIAGNOSIS — R509 Fever, unspecified: Secondary | ICD-10-CM | POA: Insufficient documentation

## 2011-08-14 DIAGNOSIS — K219 Gastro-esophageal reflux disease without esophagitis: Secondary | ICD-10-CM | POA: Insufficient documentation

## 2011-08-14 DIAGNOSIS — R070 Pain in throat: Secondary | ICD-10-CM | POA: Insufficient documentation

## 2011-08-14 DIAGNOSIS — Z79899 Other long term (current) drug therapy: Secondary | ICD-10-CM | POA: Insufficient documentation

## 2011-08-14 DIAGNOSIS — J40 Bronchitis, not specified as acute or chronic: Secondary | ICD-10-CM | POA: Insufficient documentation

## 2011-08-14 DIAGNOSIS — R059 Cough, unspecified: Secondary | ICD-10-CM | POA: Insufficient documentation

## 2011-08-14 DIAGNOSIS — R5381 Other malaise: Secondary | ICD-10-CM | POA: Insufficient documentation

## 2011-08-14 LAB — BASIC METABOLIC PANEL
BUN: 12 mg/dL (ref 6–23)
Calcium: 9.7 mg/dL (ref 8.4–10.5)
Creatinine, Ser: 0.83 mg/dL (ref 0.50–1.10)
GFR calc non Af Amer: 76 mL/min — ABNORMAL LOW (ref >90)
Glucose, Bld: 137 mg/dL — ABNORMAL HIGH (ref 70–99)

## 2011-08-14 LAB — CBC
HCT: 39.7 % (ref 36.0–46.0)
Hemoglobin: 14 g/dL (ref 12.0–15.0)
MCV: 91.1 fL (ref 78.0–100.0)
RBC: 4.36 MIL/uL (ref 3.87–5.11)
WBC: 11.4 10*3/uL — ABNORMAL HIGH (ref 4.0–10.5)

## 2011-08-14 LAB — DIFFERENTIAL
Eosinophils Relative: 0 % (ref 0–5)
Lymphocytes Relative: 8 % — ABNORMAL LOW (ref 12–46)
Lymphs Abs: 0.9 10*3/uL (ref 0.7–4.0)
Monocytes Absolute: 1 10*3/uL (ref 0.1–1.0)
Monocytes Relative: 9 % (ref 3–12)
Neutro Abs: 9.4 10*3/uL — ABNORMAL HIGH (ref 1.7–7.7)

## 2011-08-14 IMAGING — CR DG CHEST 2V
2 series · 2 of 2 positions shown · non-contrast
Comparison: None.

CLINICAL DATA: Productive cough, fever.

CHEST - 2 VIEW

[w chest pa]
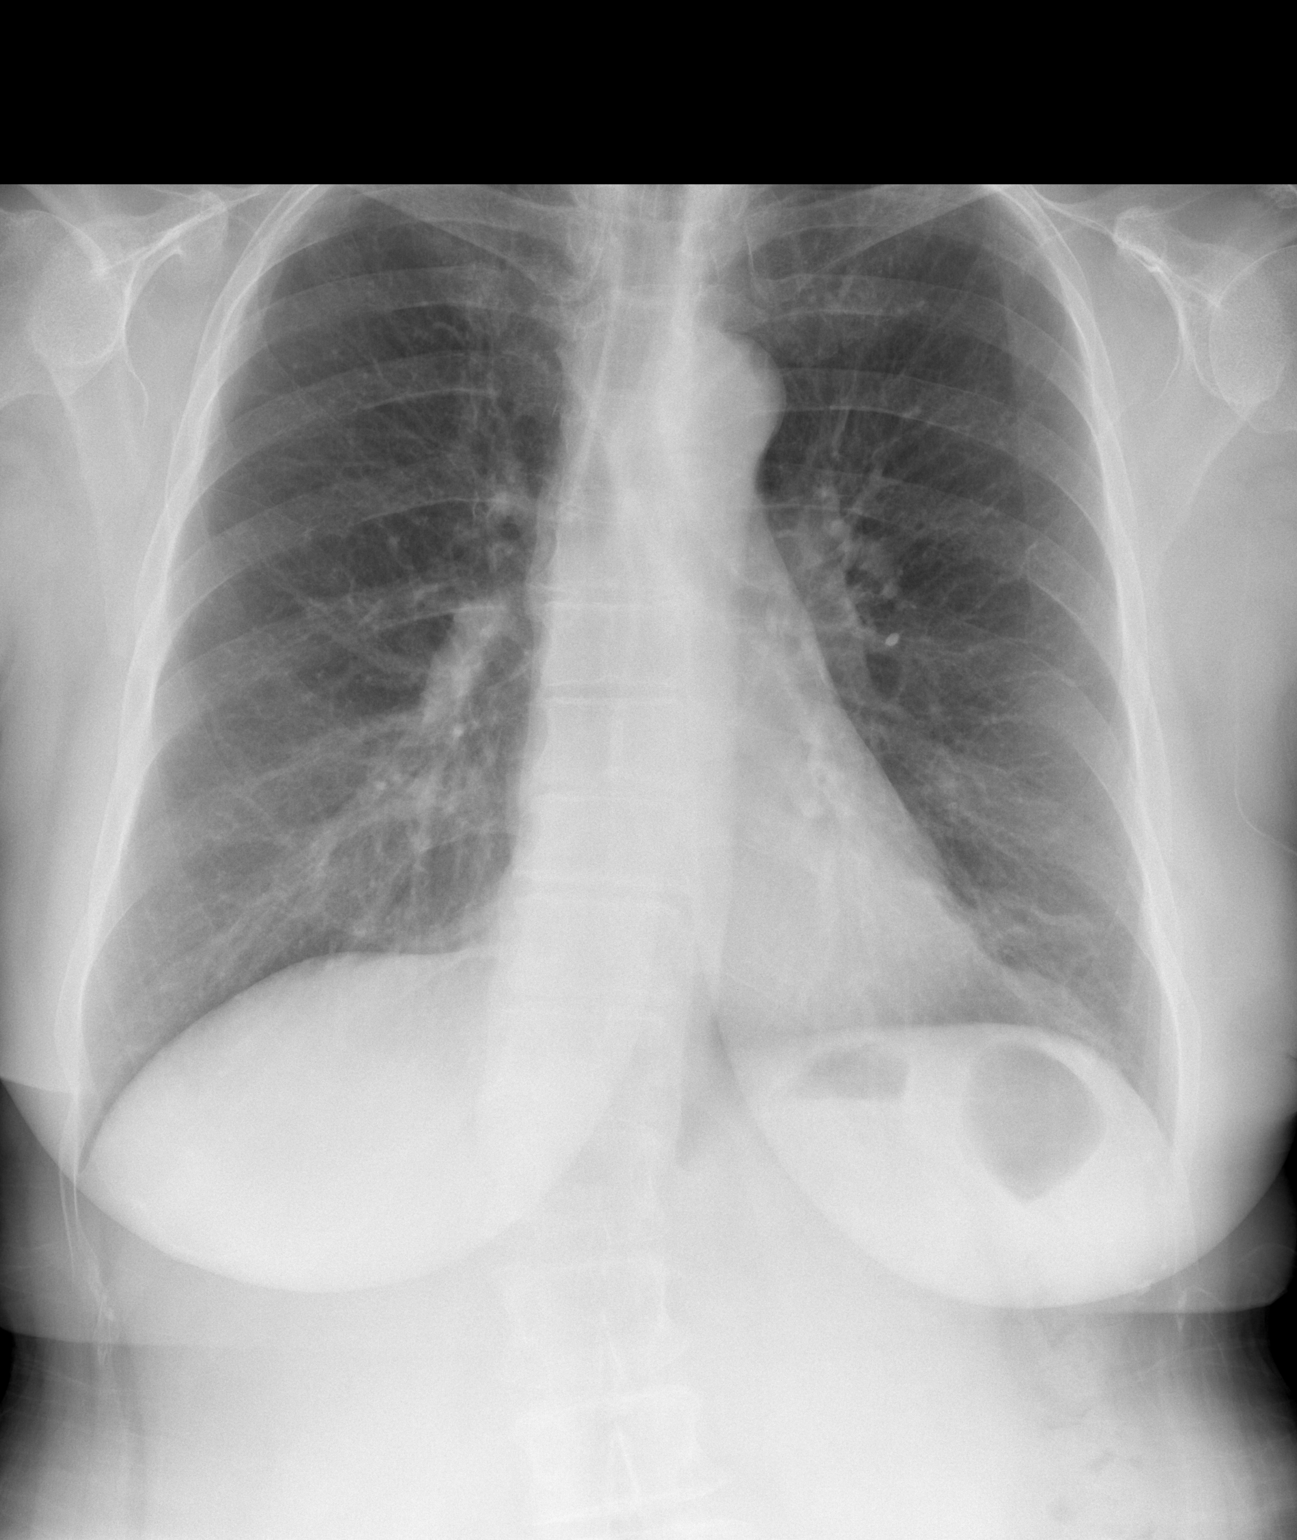

[w chest lat]
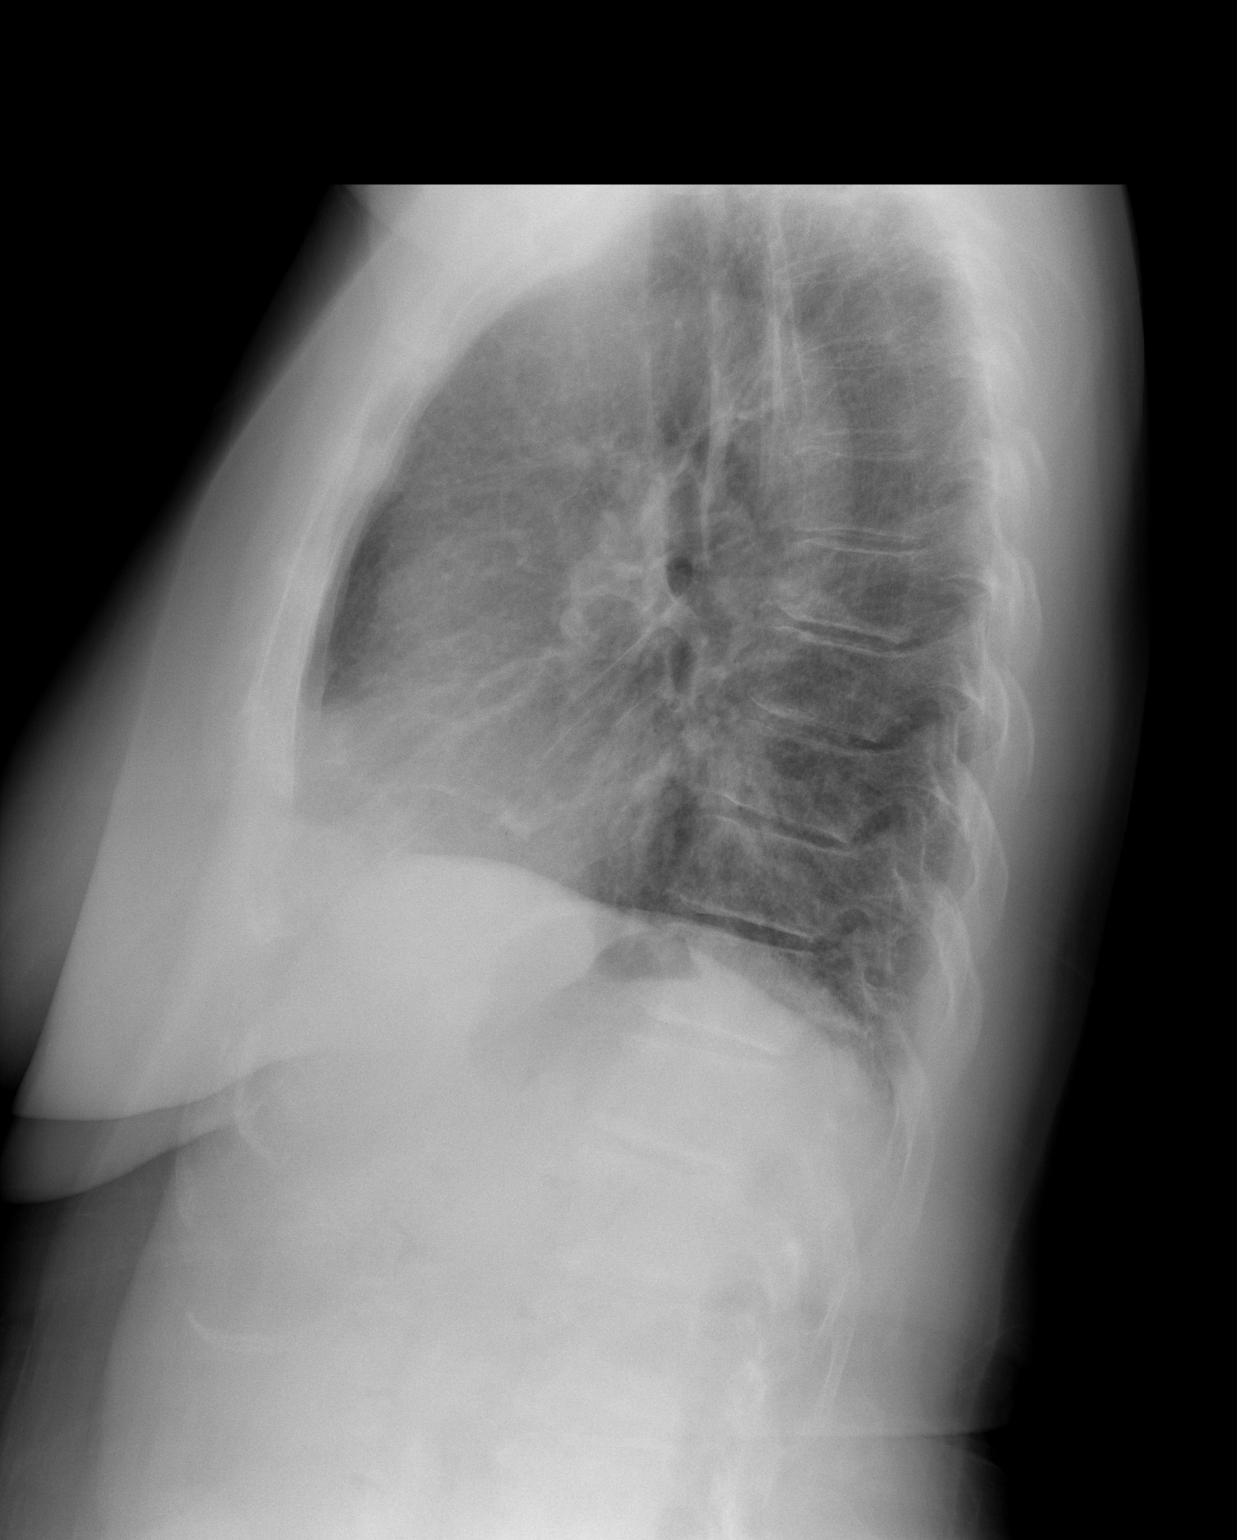

[2 of 2 positions shown; findings below may reference images not displayed]

FINDINGS: Mild linear opacity at the bases.  Mild bronchitic
temperature change.  Otherwise, no focal areas of consolidation,
pleural effusion, or pneumothorax.  Mild rightward curvature of the
lower thoracic/lumbar spine.  No acute osseous abnormality
identified.  The
IMPRESSION: Mild bronchitic type changes and linear opacities at the bases,
likely atelectasis or scarring.  Otherwise, no focal areas of
consolidation.

## 2011-08-14 NOTE — ED Notes (Signed)
PT. REPORTS COUGH WITH SOB , HEADACHE AND UPPER BACK PAIN , STATES HER METROPOLOL WAS CHANGED TO LOSARTAN BY PCP LAST WEEK WITH OCCASIONAL PALPITATIONS.

## 2011-08-15 MED ORDER — ALBUTEROL SULFATE (5 MG/ML) 0.5% IN NEBU
5.0000 mg | INHALATION_SOLUTION | Freq: Once | RESPIRATORY_TRACT | Status: AC
  Administered 2011-08-15: 5 mg via RESPIRATORY_TRACT
  Filled 2011-08-15: qty 1

## 2011-08-15 MED ORDER — LEVOFLOXACIN 750 MG PO TABS
750.0000 mg | ORAL_TABLET | Freq: Every day | ORAL | Status: DC
  Filled 2011-08-15: qty 1

## 2011-08-15 MED ORDER — LEVOFLOXACIN 500 MG PO TABS: 500.0000 mg | ORAL_TABLET | Freq: Every day | ORAL | Status: AC

## 2011-08-15 MED ORDER — ALBUTEROL SULFATE HFA 108 (90 BASE) MCG/ACT IN AERS: 2.0000 | INHALATION_SPRAY | RESPIRATORY_TRACT | Status: DC | PRN

## 2011-08-15 MED ORDER — LEVOFLOXACIN 750 MG PO TABS
750.0000 mg | ORAL_TABLET | Freq: Once | ORAL | Status: AC
  Administered 2011-08-15: 750 mg via ORAL
  Filled 2011-08-15: qty 1

## 2011-08-15 MED ORDER — AMOXICILLIN-POT CLAVULANATE 875-125 MG PO TABS
1.0000 | ORAL_TABLET | Freq: Once | ORAL | Status: DC
  Filled 2011-08-15: qty 1

## 2011-08-15 NOTE — Discharge Instructions (Signed)
Please take a medication called Levaquin as prescribed, albuterol inhaler 2 puffs every 4 hours for the next 24 hours. Call your doctor for a close followup within 2 days. Return to the emergency department for severe worsening symptoms. This would include chest pain, difficulty breathing, swelling of legs or high fevers.  Your heart rate has been 90 here, you may start taking metoprolol 12.5 mg once a day. If you feel like your heart is racing, take your pulse and if it is over 100 you may take 1 additional 12.5 mg tablet and call your doctor immediately. Please let your doctor know about the medication changes when you see them on Wednesday or sooner.

## 2011-08-15 NOTE — ED Provider Notes (Signed)
History     CSN: [id number]  Arrival date & time 08/14/11  2141   First MD Initiated Contact with Patient 08/14/11 2344      Chief Complaint  Patient presents with  . Cough    (Consider location/radiation/quality/duration/timing/severity/associated sxs/prior treatment) HPI Comments: 61 year old female with a history of hypertension, palpitations who presents with a complaint of cough. This was gradual in onset yesterday, persistent, associated with a fever and generalized fatigue with associated palpitations. Nothing makes this better or worse, she does have albuterol at home but has not used it. She was seen in emergency department for hypertension last week and was switched from metoprolol to an angiotensin receptor blocker with a diuretic. She states that since that time she has had intermittent palpitations again.  She does have associated sore throat, nasal congestion and mild shortness of breath  Patient is a 61 y.o. female presenting with cough. The history is provided by the patient and a relative.  Cough    Past Medical History  Diagnosis Date  . 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 S/P partial hysterectomy   . Hypertension   . Headache     Migraines  . GERD (gastroesophageal reflux disease)   . GERD (gastroesophageal reflux disease)   . Arthritis   . Paget's disease of vulva   . Multiple allergies   . Vulvar mass 04/16/2011    Past Surgical History  Procedure Date  . Anterior and posterior repair 1989  . Simple vulvectomy 2011    right side  . Tonsilectomy, adenoidectomy, bilateral myringotomy and tubes   . Wisdom tooth extraction   . Hand surgery 2012    Left hand, ring finger cyst removal, trigger finger surg x 3 fingers  . Vaginal hysterectomy 1989    for prolapse  . Trigger finger surgery     Family History  Problem Relation Age of Onset  . Cancer Maternal Grandmother   . Hypertension Father   . Heart disease Father   . Hypertension  Mother   . Heart disease Mother   . Cancer Paternal Grandmother     breast cancer  . Colon cancer Paternal Uncle     History  Substance Use Topics  . Smoking status: Former Smoker    Quit date: 04/16/1991  . Smokeless tobacco: Not on file  . Alcohol Use: No    OB History    Grav Para Term Preterm Abortions TAB SAB Ect Mult Living                  Review of Systems  Respiratory: Positive for cough.   All other systems reviewed and are negative.    Allergies  Other; Azithromycin; and Sulfa antibiotics  Home Medications   Current Outpatient Rx  Name Route Sig Dispense Refill  . ACETAMINOPHEN 325 MG PO TABS Oral Take 650 mg by mouth every 6 (six) hours as needed. For pain    . ALBUTEROL SULFATE HFA 108 (90 BASE) MCG/ACT IN AERS Inhalation Inhale 2 puffs into the lungs every 6 (six) hours as needed. For wheezing    . CICLESONIDE 160 MCG/ACT IN AERS Inhalation Inhale 2 puffs into the lungs 2 (two) times daily.      Marland Kitchen FLUTICASONE PROPIONATE 50 MCG/ACT NA SUSP Nasal Place 2 sprays into the nose daily.      Marland Kitchen LANSOPRAZOLE 30 MG PO CPDR Oral Take 1 capsule (30 mg total) by mouth daily. 90 capsule 3  .  LORATADINE 10 MG PO TABS Oral Take 10 mg by mouth daily.      Marland Kitchen LOSARTAN POTASSIUM-HCTZ 100-25 MG PO TABS Oral Take 1 tablet by mouth daily. 30 tablet 3  . SUMATRIPTAN SUCCINATE 50 MG PO TABS Oral Take 50 mg by mouth every 2 (two) hours as needed.      . ALBUTEROL SULFATE HFA 108 (90 BASE) MCG/ACT IN AERS Inhalation Inhale 2 puffs into the lungs every 4 (four) hours as needed for wheezing or shortness of breath. 1 Inhaler 3  . LEVOFLOXACIN 500 MG PO TABS Oral Take 1 tablet (500 mg total) by mouth daily. 7 tablet 0    BP 122/71  Pulse 90  Temp(Src) 99.4 F (37.4 C) (Oral)  Resp 16  SpO2 90%  Physical Exam  Nursing note and vitals reviewed. Constitutional: She appears well-developed and well-nourished. No distress.  HENT:  Head: Normocephalic and atraumatic.  Mouth/Throat:  Oropharynx is clear and moist. No oropharyngeal exudate.       Oropharynx clear, nostrils clear, no erythema, exudate, asymmetry or hypertrophy  Eyes: Conjunctivae and EOM are normal. Pupils are equal, round, and reactive to light. Right eye exhibits no discharge. Left eye exhibits no discharge. No scleral icterus.  Neck: Normal range of motion. Neck supple. No JVD present. No thyromegaly present.  Cardiovascular: Normal rate, regular rhythm, normal heart sounds and intact distal pulses.  Exam reveals no gallop and no friction rub.   No murmur heard. Pulmonary/Chest: Effort normal. No respiratory distress. She has no wheezes. She has rales ( Rales at the left base which resolved with coughing).  Abdominal: Soft. Bowel sounds are normal. She exhibits no distension and no mass. There is no tenderness.  Musculoskeletal: Normal range of motion. She exhibits no edema and no tenderness.  Lymphadenopathy:    She has no cervical adenopathy.  Neurological: She is alert. Coordination normal.  Skin: Skin is warm and dry. No rash noted. No erythema.  Psychiatric: She has a normal mood and affect. Her behavior is normal.    ED Course  Procedures (including critical care time)  Labs Reviewed  CBC - Abnormal; Notable for the following:    WBC 11.4 (*)    All other components within normal limits  DIFFERENTIAL - Abnormal; Notable for the following:    Neutrophils Relative 83 (*)    Neutro Abs 9.4 (*)    Lymphocytes Relative 8 (*)    All other components within normal limits  BASIC METABOLIC PANEL - Abnormal; Notable for the following:    Sodium 132 (*)    Chloride 95 (*)    Glucose, Bld 137 (*)    GFR calc non Af Amer 76 (*)    GFR calc Af Amer 88 (*)    All other components within normal limits   Dg Chest 2 View  08/14/2011  *RADIOLOGY REPORT*  Clinical Data: Productive cough, fever.  CHEST - 2 VIEW  Comparison: None.  Findings: Mild linear opacity at the bases.  Mild bronchitic temperature  change.  Otherwise, no focal areas of consolidation, pleural effusion, or pneumothorax.  Mild rightward curvature of the lower thoracic/lumbar spine.  No acute osseous abnormality identified.  The  IMPRESSION: Mild bronchitic type changes and linear opacities at the bases, likely atelectasis or scarring.  Otherwise, no focal areas of consolidation.  Original Report Authenticated By: Waneta Martins, M.D.     1. Bronchitis       MDM  Vital signs reveal fever, no tachycardia or  respiratory distress. Her pulse is 90, oxygen saturation is 95% on my exam on room air. The rales clear with deep breathing and coughing. Will give albuterol treatments start Levaquin, to follow up with her family doctor of which she has an appointment in 3 days  Antibiotics given in emergency department, albuterol nebulizer given, tachycardia resolves, patient encouraged to start 12.5 mg of metoprolol daily and followup with her doctor on Wednesday. She will follow her blood pressure and pulse accurately over the next 3 days until follow up.  CXR neg for acute infiltrate.          Vida Roller, MD 08/15/11 [phone]

## 2011-08-16 ENCOUNTER — Telehealth: Payer: Self-pay | Admitting: Family Medicine

## 2011-08-16 NOTE — Telephone Encounter (Signed)
Triage Record Num: [id number] Operator: Kelle Darting Patient Name: [name] Call Date & Time: 08/14/2011 8:32:26PM Patient Phone: [phone] PCP: Kerby Nora Patient Gender: Female PCP Fax : [phone] Patient DOB: [birth year] Practice Name: [facility] Reason for Call: Caller: [name]/Patient; PCP: Excell Seltzer.; CB#: [phone]; Call regarding Fever, weakness on exertion; Temp. 103 (o) at 2030; Onset: 08/13/11; Sx notes: Cough, congestion, fever started 08/14/11, also having elevated HR, was taken off of Metoprolol on 08/11/11 and placed on Losartin, next appt. on 08/18/11, has had a wt. loss of 4 lbs since med change, having a hard time breathing and has cough that is productive, B/P 139/79, HR 117; Guideline used: Breathing problems; Disp: See ED Immediately due to worsening breathing problems and heart rate increasing with any activity; Appt. made: No. Protocol(s) Used: Breathing Problems Recommended Outcome per Protocol: See ED Immediately Reason for Outcome: New or worsening breathing problems that have not been evaluated Care Advice: ~ Another adult should drive. Call EMS 911 if develop new onset or increasing confusion or lethargy; breathing problems continue to worsen or skin becomes bluish/gray; develops chest pain. ~ Write down provider's name. List or place the following in a bag for transport with the patient: current prescription and/or nonprescription medications; alternative treatments, therapies and medications; and street drugs. ~ ~ Place person in a position of comfort and loosen tight clothing. 08/14/2011 8:59:28PM Page 1 of 1 [email or web address]

## 2011-08-18 ENCOUNTER — Ambulatory Visit (INDEPENDENT_AMBULATORY_CARE_PROVIDER_SITE_OTHER): Payer: BC Managed Care – PPO | Admitting: Family Medicine

## 2011-08-18 ENCOUNTER — Encounter: Payer: Self-pay | Admitting: Family Medicine

## 2011-08-18 VITALS — BP 94/62 | HR 66 | Temp 98.7°F | Wt 170.0 lb

## 2011-08-18 DIAGNOSIS — J111 Influenza due to unidentified influenza virus with other respiratory manifestations: Secondary | ICD-10-CM

## 2011-08-18 DIAGNOSIS — I10 Essential (primary) hypertension: Secondary | ICD-10-CM

## 2011-08-18 DIAGNOSIS — Z8679 Personal history of other diseases of the circulatory system: Secondary | ICD-10-CM

## 2011-08-18 MED ORDER — HYDROCODONE-HOMATROPINE 5-1.5 MG/5ML PO SYRP: ORAL_SOLUTION | ORAL | Status: DC

## 2011-08-18 MED ORDER — LOSARTAN POTASSIUM-HCTZ 100-12.5 MG PO TABS: 1.0000 | ORAL_TABLET | Freq: Every day | ORAL | Status: DC

## 2011-08-18 MED ORDER — METOPROLOL TARTRATE 25 MG PO TABS: 12.5000 mg | ORAL_TABLET | Freq: Two times a day (BID) | ORAL | Status: DC

## 2011-08-18 NOTE — Progress Notes (Signed)
Patient Name: [name] Date of Birth: [birth year] Age: 61 y.o. Medical Record Number: [id number] Gender: female Date of Encounter: 08/18/2011  History of Present Illness:  [name] is a 61 y.o. very pleasant female patient who presents with the following:  Felt like about 2 days after stopping her metoprolol and felt like her heart was beating really quickly. She felt as if her pulse increased at times greater than 100-110 beats per minute.  103 temperature.  Body aching all over  Acute onset on Saturday. The patient states that she "felt like she got hit by a truck: "And now she feels terrible has an extreme amount of coughing, diffuse body aches and myalgias. Some headache and sore throat.  She actually went to the emergency room, and they felt like she most likely had a bronchitis and is currently taking Levaquin. Else's suggested that she decrease her metoprolol dose to half a tablet  In the morning and at night.  At this point now that she is on Hyzaar 100/25 plus the addition of metoprolol her pulse is 66 and her blood pressure is only 94/62  1/2 metoprolol in AM and 1/2 a metoprolol at night  Past Medical History, Surgical History, Social History, Family History, Problem List, Medications, and Allergies have been reviewed and updated if relevant.  Review of Systems: ROS: GEN: Acute illness details above GI: Tolerating PO intake GU: maintaining adequate hydration and urination Pulm: No SOB Interactive and getting along well at home.  Otherwise, ROS is as per the HPI.   Physical Examination: Filed Vitals:   08/18/11 1131  BP: 94/62  Pulse: 66  Temp: 98.7 F (37.1 C)  TempSrc: Oral  Weight: 170 lb (77.111 kg)    There is no height on file to calculate BMI.   Gen: WDWN, NAD; A & O x3, cooperative. Pleasant.Globally Non-toxic HEENT: Normocephalic and atraumatic. Throat clear, w/o exudate, R TM clear, L TM - good landmarks, No fluid present. rhinnorhea. No  frontal or maxillary sinus T. MMM NECK: Anterior cervical  LAD is present CV: RRR, No M/G/R, cap refill <2 sec PULM: Breathing comfortably in no respiratory distress. no wheezing, crackles, rhonchi ABD: S,NT,ND,+BS. No HSM. No rebound. EXT: No c/c/e PSYCH: Friendly, good eye contact MSK: Nml gait   Assessment and Plan:  1. Unspecified essential hypertension   2. PALPITATIONS, HX OF   3. HYPERTENSION   4. Influenza with respiratory manifestation other than pneumonia     Orders Today: No orders of the defined types were placed in this encounter.    Medications Today: Meds ordered this encounter  Medications  . HYDROcodone-homatropine (HYCODAN) 5-1.5 MG/5ML syrup    Sig: 1 tsp po at night before bed prn cough    Dispense:  240 mL    Refill:  0  . losartan-hydrochlorothiazide (HYZAAR) 100-12.5 MG per tablet    Sig: Take 1 tablet by mouth daily.    Dispense:  30 tablet    Refill:  11  . metoprolol tartrate (LOPRESSOR) 25 MG tablet    Sig: Take 0.5 tablets (12.5 mg total) by mouth 2 (two) times daily.    Dispense:  30 tablet    Refill:  5    The patient did have some breakthrough palpitations and mild tachycardia and is previously been worked up for this and placed on metoprolol.  Prior to this, she was having some bradycardia and symptomatic on 25 mg of metoprolol twice daily. I decreased her hydrochlorothiazide dose to 12.5 mg daily,  and have asked her to continue her metoprolol, but only at 12.5 mg p.o. Twice daily  The patient's clinical exam and history is consistent with a diagnosis of influenza. Supportive care. Fluids. Cough medicines as needed  Anti-pyretics. Detailed in p/i  Infection control emphasized, including OOW or school until AF 24 hours.

## 2011-08-20 ENCOUNTER — Telehealth: Payer: Self-pay | Admitting: *Deleted

## 2011-08-20 NOTE — Telephone Encounter (Signed)
Rep with [employer] called stating that additional information is needed to process disability claim.  Please call and reference claim number [id number].

## 2011-08-23 NOTE — Telephone Encounter (Signed)
Heather please call to determine what info is needed.

## 2011-08-23 NOTE — Telephone Encounter (Signed)
i am not clear what this call is about - i have seen this patient a couple of times in the office.

## 2011-08-23 NOTE — Telephone Encounter (Signed)
Yes, she had influenza. I can fill out paperwork for her - please give note for work as above

## 2011-08-23 NOTE — Telephone Encounter (Signed)
Patient says she was out of work from Saturday to Wednesday when she had bronchitis and was seen in the ER. Patient is asking if she can have a note from Wednesday to Sunday because she was still sick. Patient is also asking if you will fill out the paper work for [employer] so she can be paid for this.

## 2011-08-23 NOTE — Telephone Encounter (Signed)
Spoke with [employer] rep and answered all questioned needed and they will contact us if any additional info needed

## 2011-08-27 ENCOUNTER — Telehealth: Payer: Self-pay | Admitting: Family Medicine

## 2011-08-27 NOTE — Telephone Encounter (Signed)
Pt is calling about disability forms that were approved for 08/22/11. She is saying that she con not return on the return date on the for and wanted to know if Dr. Patsy Lager can extend the date for her to return back to work. She said if there are any questions please give her a call.

## 2011-08-29 NOTE — Telephone Encounter (Signed)
I need to know the exact dates when she was out from work and when she returned to work -

## 2011-08-30 NOTE — Telephone Encounter (Signed)
Patient needs not to say she was excused through 08-22-2011 to return on August 23 2011.

## 2011-08-30 NOTE — Telephone Encounter (Signed)
Again, exact dates out. When she first missed work and date of her return.

## 2011-09-01 ENCOUNTER — Encounter: Payer: Self-pay | Admitting: Family Medicine

## 2011-09-01 NOTE — Telephone Encounter (Signed)
March 9th -17th 2013 return on the 18th.

## 2011-09-01 NOTE — Telephone Encounter (Signed)
done

## 2011-09-02 NOTE — Telephone Encounter (Signed)
Patient advised and letter faxed to [facility]

## 2011-09-28 ENCOUNTER — Encounter: Payer: Self-pay | Admitting: Family Medicine

## 2011-09-29 ENCOUNTER — Encounter: Payer: Self-pay | Admitting: *Deleted

## 2011-10-18 ENCOUNTER — Encounter: Payer: Self-pay | Admitting: Gynecology

## 2011-10-18 ENCOUNTER — Ambulatory Visit: Payer: BC Managed Care – PPO | Attending: Gynecology | Admitting: Gynecology

## 2011-10-18 VITALS — BP 130/76 | HR 86 | Temp 98.1°F | Resp 20 | Ht 64.0 in | Wt 177.1 lb

## 2011-10-18 DIAGNOSIS — C4499 Other specified malignant neoplasm of skin, unspecified: Secondary | ICD-10-CM

## 2011-10-18 DIAGNOSIS — Z79899 Other long term (current) drug therapy: Secondary | ICD-10-CM | POA: Insufficient documentation

## 2011-10-18 DIAGNOSIS — C519 Malignant neoplasm of vulva, unspecified: Secondary | ICD-10-CM | POA: Insufficient documentation

## 2011-10-18 DIAGNOSIS — I10 Essential (primary) hypertension: Secondary | ICD-10-CM | POA: Insufficient documentation

## 2011-10-18 DIAGNOSIS — K219 Gastro-esophageal reflux disease without esophagitis: Secondary | ICD-10-CM | POA: Insufficient documentation

## 2011-10-18 NOTE — Progress Notes (Signed)
H&P   [name] 61 y.o. female  Chief Complaint  Patient presents with  . Paget's disease of the vulva    Follow up    HPI: Patient initially underwent surgery for Paget's disease of vulva in October 2011. There was focal involvement of the medial margin. The entire lesion is approximately 5 cm in diameter.  Interval History: The patient returns today as previously planned for followup. Since her last visit she's done well. The only complaint is that she has a buzzing sensation in the midportion of her right vulva. She denies any pruritus or any skin changes. The inclusion cyst noted at last visit has resolved.  Past Medical History  Diagnosis Date  . 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 S/P partial hysterectomy   . Hypertension   . Headache     Migraines  . GERD (gastroesophageal reflux disease)   . GERD (gastroesophageal reflux disease)   . Arthritis   . Paget's disease of vulva   . Multiple allergies   . Vulvar mass 04/16/2011   Past Surgical History  Procedure Date  . Anterior and posterior repair 1989  . Simple vulvectomy 2011    right side  . Tonsilectomy, adenoidectomy, bilateral myringotomy and tubes   . Wisdom tooth extraction   . Hand surgery 2012    Left hand, ring finger cyst removal, trigger finger surg x 3 fingers  . Vaginal hysterectomy 1989    for prolapse  . Trigger finger surgery    Current Outpatient Prescriptions  Medication Sig Dispense Refill  . acetaminophen (TYLENOL) 325 MG tablet Take 650 mg by mouth every 6 (six) hours as needed. For pain      . albuterol (PROVENTIL HFA;VENTOLIN HFA) 108 (90 BASE) MCG/ACT inhaler Inhale 2 puffs into the lungs every 6 (six) hours as needed. For wheezing      . albuterol (PROVENTIL HFA;VENTOLIN HFA) 108 (90 BASE) MCG/ACT inhaler Inhale 2 puffs into the lungs every 4 (four) hours as needed for wheezing or shortness of breath.  1 Inhaler  3  . ciclesonide (ALVESCO) 160 MCG/ACT inhaler Inhale 2  puffs into the lungs 2 (two) times daily.        . fluticasone (FLONASE) 50 MCG/ACT nasal spray Place 2 sprays into the nose daily.        . lansoprazole (PREVACID) 30 MG capsule Take 1 capsule (30 mg total) by mouth daily.  90 capsule  3  . loratadine (CLARITIN) 10 MG tablet Take 10 mg by mouth daily.        Marland Kitchen losartan-hydrochlorothiazide (HYZAAR) 100-12.5 MG per tablet Take 1 tablet by mouth daily.  30 tablet  11  . metoprolol tartrate (LOPRESSOR) 25 MG tablet Take 0.5 tablets (12.5 mg total) by mouth 2 (two) times daily.  30 tablet  5  . SUMAtriptan (IMITREX) 50 MG tablet Take 50 mg by mouth every 2 (two) hours as needed.         Allergies  Allergen Reactions  . Other     Allergic to toothpaste- makes mouth peel Hay fever/dust mites/trees "365 allergic"   . Azithromycin Itching and Rash  . Sulfa Antibiotics Hives and Rash   History   Social History  . Marital Status: Married    Spouse Name: N/A    Number of Children: N/A  . Years of Education: N/A   Occupational History  . Not on file.   Social History Main Topics  . Smoking status:  Former Smoker    Quit date: 04/16/1991  . Smokeless tobacco: Not on file  . Alcohol Use: No  . Drug Use: No  . Sexually Active: Not on file   Other Topics Concern  . Not on file   Social History Narrative  . No narrative on file   Family History  Problem Relation Age of Onset  . Cancer Maternal Grandmother   . Hypertension Father   . Heart disease Father   . Hypertension Mother   . Heart disease Mother   . Cancer Paternal Grandmother     breast cancer  . Colon cancer Paternal Uncle       ROS: 10 point review of systems is negative except as noted above Vitals:  Blood pressure 130/76, pulse 86, temperature 98.1 F (36.7 C), temperature source Oral, resp. rate 20, height 5\' 4"  (1.626 m), weight 177 lb 1.6 oz (80.332 kg).  Physical Exam: In general the patient is a healthy white female no acute distress  The abdomen is soft  nontender no masses, organomegaly, ascites or hernias are noted  Pelvic exam EGBUS appears normal. There is a hemangioma on each vulva. Careful inspection of the right vulva where the patient notes a buzzing sensation shows a well-healed scar. There are no lesions that appear to be consistent with Paget's disease.  Assessment/Plan: Paget's disease of vulva. No evidence recurrent. The buzzing sensation the vulva which I suspect associated with her scar. She indicates this is not bothersome and therefore we will not pursue further intervention or evaluation.  Patient returned to see me in 6 months   CLARKE-PEARSON,Joanathan Affeldt L, MD 10/18/2011, 2:47 PM

## 2011-10-18 NOTE — Patient Instructions (Signed)
Return to see me in 6 months.

## 2011-12-21 ENCOUNTER — Telehealth: Payer: Self-pay

## 2011-12-21 ENCOUNTER — Telehealth: Payer: Self-pay | Admitting: Family Medicine

## 2011-12-21 NOTE — Telephone Encounter (Signed)
Pt tooK BP today 132/92 pulse 48 is irregular. Pt said pulse has been irregular for years. Pt states feeling extremely tired and light headed on and off. No chest pain,SOB or h/a. Pt taking Metoprolol 25 mg takes 1/2 tab twice a day and Losartan HCTZ 100-12.5 takes 1 tab daily.  Pt wants to know if can adjust med. Pt scheduled appt with Dr Ermalene Searing 12/22/11 at 9:30 am.Please advise. [facility]. If condition changes or worsens pt to go to [facility].

## 2011-12-21 NOTE — Telephone Encounter (Signed)
Caller: Ryen/Patient; PCP: Kerby Nora E.; CB#: [phone]; ; ; Call regarding States That A. Nurse Was Just Calling Her and She Accidentally Elnoria Howard Up On Nurse. Called Office Spoke With Forest Becker, Transferred Me To Reena. Reena states that Dr. Gaylan Gerold PA tried to call pt and in EPIC states that she just spoke with pt. Noticed that pt was no longer on the line. Called pt back and states that she had received a call while on hold from Dr. Daphine Deutscher PA and no longer had questions.

## 2011-12-21 NOTE — Telephone Encounter (Signed)
Pt was on metoprolol for past palpitations. She can go ahead and hold metoprolol tonight and tomorrow before appt. to see if improves pulse and fatigue.

## 2011-12-21 NOTE — Telephone Encounter (Signed)
Advised patient of instructions in note below.

## 2011-12-21 NOTE — Telephone Encounter (Signed)
Lyla Son spoke with pt and pulse running in 50s; pt wonders if med might be causing. Lyla Son parked pt on 102. I picked up 102 and no one was there. I called pts home # spoke with pts husband; pt is at work and no # to reach pt at work he advised to leave message on pts cell phone. I asked pts husband if he spoke with her to ask her to call office back; I also left v/m on pts cell to call back. No info given to pts husband.

## 2011-12-22 ENCOUNTER — Encounter: Payer: Self-pay | Admitting: Family Medicine

## 2011-12-22 ENCOUNTER — Ambulatory Visit (INDEPENDENT_AMBULATORY_CARE_PROVIDER_SITE_OTHER): Payer: BC Managed Care – PPO | Admitting: Family Medicine

## 2011-12-22 VITALS — BP 124/80 | HR 54 | Temp 98.4°F | Ht 64.0 in | Wt 179.0 lb

## 2011-12-22 DIAGNOSIS — R5381 Other malaise: Secondary | ICD-10-CM

## 2011-12-22 DIAGNOSIS — R001 Bradycardia, unspecified: Secondary | ICD-10-CM | POA: Insufficient documentation

## 2011-12-22 DIAGNOSIS — I498 Other specified cardiac arrhythmias: Secondary | ICD-10-CM

## 2011-12-22 DIAGNOSIS — I10 Essential (primary) hypertension: Secondary | ICD-10-CM

## 2011-12-22 DIAGNOSIS — R5383 Other fatigue: Secondary | ICD-10-CM | POA: Insufficient documentation

## 2011-12-22 MED ORDER — METOPROLOL TARTRATE 12.5 MG HALF TABLET: ORAL_TABLET | ORAL | Status: DC

## 2011-12-22 NOTE — Progress Notes (Signed)
  Subjective:    Patient ID: [name], female    DOB: 01-17-1951, 61 y.o.   MRN: [id number]  HPI  61 year old female with history of HTN and palpitations on BBlocker presents with bradycardia in last month at allergist and continued at home (noted off and on in our vital checks) and fatigue in last week.  Occ fuzzy in head, but no orthostatic hypotension. BP running good. Feeling well otherwise Headaches are better, allergies well controlled.  Wt Readings from Last 3 Encounters:  12/22/11 179 lb (81.194 kg)  10/18/11 177 lb 1.6 oz (80.332 kg)  08/18/11 170 lb (77.111 kg)   Has been trying to exercise.  She tried to stop BBlocker earlier in year, but when this is stopped heart rate increases and feels sporadic.      Review of Systems  Unable to perform ROS Constitutional: Negative for fever and fatigue.  HENT: Negative for ear pain.   Eyes: Negative for pain.  Respiratory: Negative for chest tightness and shortness of breath.   Cardiovascular: Negative for chest pain, palpitations and leg swelling.  Gastrointestinal: Negative for abdominal pain.  Genitourinary: Negative for dysuria.  Neurological: Positive for dizziness. Negative for syncope and headaches.       Objective:   Physical Exam  Constitutional: Vital signs are normal. She appears well-developed and well-nourished. She is cooperative.  Non-toxic appearance. She does not appear ill. No distress.  HENT:  Head: Normocephalic.  Right Ear: Hearing, tympanic membrane, external ear and ear canal normal. Tympanic membrane is not erythematous, not retracted and not bulging.  Left Ear: Hearing, tympanic membrane, external ear and ear canal normal. Tympanic membrane is not erythematous, not retracted and not bulging.  Nose: No mucosal edema or rhinorrhea. Right sinus exhibits no maxillary sinus tenderness and no frontal sinus tenderness. Left sinus exhibits no maxillary sinus tenderness and no frontal sinus tenderness.    Mouth/Throat: Uvula is midline, oropharynx is clear and moist and mucous membranes are normal.  Eyes: Conjunctivae, EOM and lids are normal. Pupils are equal, round, and reactive to light. No foreign bodies found.  Neck: Trachea normal and normal range of motion. Neck supple. Carotid bruit is not present. No mass and no thyromegaly present.  Cardiovascular: Normal rate, regular rhythm, S1 normal, S2 normal, normal heart sounds, intact distal pulses and normal pulses.  Exam reveals no gallop and no friction rub.   No murmur heard. Pulmonary/Chest: Effort normal and breath sounds normal. Not tachypneic. No respiratory distress. She has no decreased breath sounds. She has no wheezes. She has no rhonchi. She has no rales.  Abdominal: Soft. Normal appearance and bowel sounds are normal. There is no tenderness.  Neurological: She is alert.  Skin: Skin is warm, dry and intact. No rash noted.  Psychiatric: Her speech is normal and behavior is normal. Judgment and thought content normal. Her mood appears not anxious. Cognition and memory are normal. She does not exhibit a depressed mood.          Assessment & Plan:

## 2011-12-22 NOTE — Assessment & Plan Note (Addendum)
Long term.. Previously asymptomatic.Marland Kitchen Likely due to BBlocker. Pt hesitant to stop BBlocker entirely as palpitations have recurred off this med in past. Will decrease to very samll dose. Follow pulse and BP at home.

## 2011-12-22 NOTE — Assessment & Plan Note (Signed)
Not clearly due to bradycardia as new where bradycardia has been going on a while. If not improving  Consider further evaluation with labs.

## 2011-12-22 NOTE — Assessment & Plan Note (Signed)
With decrease of metoprolol.. If BPs above goal 140/90... Start back losartan HCTZ 100/ 25.

## 2011-12-22 NOTE — Patient Instructions (Addendum)
Try lower dose of metoprol.  Follow BP at home. If greater than 140/90 more than 3 measurements.. Can start back on losartan HCTZ.. 100/25 mg.  Follow pulses.. If between 60-90 but still feeling fatigue.. Call for lab work up.  When able start regular exercise.

## 2012-01-18 ENCOUNTER — Other Ambulatory Visit: Payer: Self-pay | Admitting: *Deleted

## 2012-01-18 NOTE — Telephone Encounter (Signed)
[facility]il 10 mg's.  This is no longer on pt's med list.  Last filled 06/15/11.

## 2012-01-18 NOTE — Telephone Encounter (Signed)
Okay to refill? 

## 2012-01-19 MED ORDER — CYCLOBENZAPRINE HCL 10 MG PO TABS: 10.0000 mg | ORAL_TABLET | Freq: Three times a day (TID) | ORAL | Status: AC | PRN

## 2012-01-25 ENCOUNTER — Telehealth: Payer: Self-pay | Admitting: Family Medicine

## 2012-01-25 NOTE — Telephone Encounter (Signed)
Caller: [name]/Patient; Patient Name: [name]; PCP: Excell Seltzer.; Best Callback Phone Number: [phone]. Call regarding on bruise on left leg located on the inner thigh proximal to the knee. This is the second occurence within 3 wks.  No known trauma. The area of concern is shaped like a S and  feels slightly warm to the touch. All emergment symptoms ruled out per Skin Lesion protocol with exception to 'New skin problem that has persisted over 7 days'.  See Provider in 2 weeks.  Home care advice given. Pt will call back 01/07/12 to schedule a follow up appt.

## 2012-02-28 ENCOUNTER — Telehealth: Payer: Self-pay | Admitting: Family Medicine

## 2012-02-28 NOTE — Telephone Encounter (Signed)
Will send to PCP 

## 2012-02-28 NOTE — Telephone Encounter (Signed)
Caller: Frederick/Spouse; Patient Name: [name]; PCP: Kerby Nora [facility]); Best Callback Phone Number: [phone]; Reason for call: Other As discussed at last OV - the pt wants to let MD know that her BP is consistently 140/90 - 143/95. Pt wants the MD to know . Pt had her husband call because she is at work (pt cannot take calls at work but you can leave a message on her cell -( [phone]) so Rn could not complete triage. Her question is should we change her BP med? Office please call pt back

## 2012-02-29 MED ORDER — LOSARTAN POTASSIUM-HCTZ 100-25 MG PO TABS: 1.0000 | ORAL_TABLET | Freq: Every day | ORAL | Status: DC

## 2012-02-29 NOTE — Telephone Encounter (Signed)
Sent in rx for losartan ?HCTZ with 12.5 mg more HCTZ in it. Follow Bps at home.. Call if remaining >140/90

## 2012-02-29 NOTE — Telephone Encounter (Signed)
Patient notified

## 2012-03-01 ENCOUNTER — Telehealth: Payer: Self-pay

## 2012-03-01 NOTE — Telephone Encounter (Signed)
pts husband called(pt at work) Pt's BP averaging 140-141/89-90: pulse rate 55-77.   02/29/12 7pm BP 144/105 p 60. This morning 7 am BP 138/84 p 77. No h/a chest pain or dizziness reported. Pt has not started Losartan HCTZ 100-25 mg yet; pt will pick up med after work today. [facility].Please advise.

## 2012-03-01 NOTE — Telephone Encounter (Signed)
Pt needs to start replace old BP med with new higher dose ASAP. Go to ER if severe headache, CP or SOB.

## 2012-03-02 NOTE — Telephone Encounter (Signed)
Spoke to patient she stated that she has started taking the new medication and it has helped. Her BP this morning was 124/88 P 68. She was advised to go to the ER if SOB, CP or severe HA occur.

## 2012-03-02 NOTE — Telephone Encounter (Signed)
Spoke to patient she stated that she has started taking the new medication and it has helped. Her BP this morning was 124/88 P 68. She was advised to go to the ER if SOB, CP or severe HA occur.  

## 2012-03-02 NOTE — Telephone Encounter (Signed)
Noted  

## 2012-04-03 ENCOUNTER — Encounter: Payer: Self-pay | Admitting: Family Medicine

## 2012-04-03 ENCOUNTER — Ambulatory Visit (INDEPENDENT_AMBULATORY_CARE_PROVIDER_SITE_OTHER): Payer: BC Managed Care – PPO | Admitting: Family Medicine

## 2012-04-03 ENCOUNTER — Telehealth: Payer: Self-pay | Admitting: Family Medicine

## 2012-04-03 VITALS — BP 132/80 | HR 64 | Temp 98.6°F | Wt 178.2 lb

## 2012-04-03 DIAGNOSIS — J069 Acute upper respiratory infection, unspecified: Secondary | ICD-10-CM

## 2012-04-03 DIAGNOSIS — J209 Acute bronchitis, unspecified: Secondary | ICD-10-CM | POA: Insufficient documentation

## 2012-04-03 MED ORDER — AMOXICILLIN-POT CLAVULANATE 875-125 MG PO TABS: 1.0000 | ORAL_TABLET | Freq: Two times a day (BID) | ORAL | Status: AC

## 2012-04-03 MED ORDER — BENZONATATE 100 MG PO CAPS: 100.0000 mg | ORAL_CAPSULE | Freq: Three times a day (TID) | ORAL | Status: DC | PRN

## 2012-04-03 NOTE — Telephone Encounter (Signed)
[facility] Triage Call Report Triage Record Num: [id number] Operator: Di Kindle Patient Name: [name] Call Date & Time: 04/02/2012 12:19:13PM Patient Phone: [phone] PCP: Kerby Nora Patient Gender: Female PCP Fax : [phone] Patient DOB: 1951/06/07 Practice Name: [facility] Reason for Call: Caller: [name]/Patient; PCP: Kerby Nora (Family Practice); CB#: [phone]; Caller reports onset 04/01/12 of Sore throat, cough with congestion, feels pressure in her jaw and cheek bones. Shares history of Bronchitis and Sinus Infection; Guideline: URI, afebrile, green sputum. Disposition: See within 24 hours, agrees to appointment 04/03/12 0900 with Dr Sharen Hones. Protocol(s) Used: Upper Respiratory Infection (URI) Recommended Outcome per Protocol: See Provider within 24 hours Reason for Outcome: Productive cough with colored sputum (other than clear or white sputum) Care Advice: ~ May inhale steam from hot shower or heated water. Be careful to avoid burns. Increase fluids to 8-12 eight oz (1.6 to 2.4 liters) glasses per day, half of them to be water. Soups, popsicles, fruit juices, non-caffeinated sodas (unless restricting sodium intake), jello, broths, decaf teas, etc. are all okay. Warm fluids can be soothing. ~ Call provider if has a fever over 101.5 F (38.6 C) that has not responded to home care measures, having shaking chills or any fever in someone immunocompromised/frail elderly. ~ 04/02/2012 12:32:42PM Page 1 of 1 [id number]

## 2012-04-03 NOTE — Patient Instructions (Signed)
I think you do have early bronchitis or sinusitis, but likely viral sa early on. Take medicine as prescribed: tessalon perls for cough at night. augmentin course to hold on to in case symptoms worsening or prolonged past 10 days. Push fluids and plenty of rest. Nasal saline irrigation or neti pot to help drain sinuses. May use simple mucinex with plenty of fluid to help mobilize mucous. Let us know if fever >101.5, trouble opening/closing mouth, difficulty swallowing, or worsening - you may need to be seen again.

## 2012-04-03 NOTE — Assessment & Plan Note (Signed)
Anticipate viral given short duration.   Discussed supportive care. Given h/o recurrent sinusitis and bronchitis, will provide with [facility]P script for augmentin. See pt instructions.

## 2012-04-03 NOTE — Progress Notes (Signed)
  Subjective:    Patient ID: [name], female    DOB: 07-Jul-1950, 61 y.o.   MRN: [id number]  HPI CC: sinusitis?  H/o bronchitis and sinus infections - since treated by allergist, sxs improving.  Worried this may be going into bronchitis.  3d h/o sinus congestion and ST.  Did not go into work yesterday.  + cough, possibly starting to be productive.  + PNdrainage.  Ears feel muffled.  + headache described as pressure, pressure behind eyes.  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  + tooth pain.  No fevers/chills, abd pain, ear pain.  So far has taken prescribed flonase and nothing else OTC  No smokers at home.  No sick contacts at home.  No h/o asthma/COPD.  Past Medical History  Diagnosis Date  . 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 S/P partial hysterectomy   . Hypertension   . Headache     Migraines  . GERD (gastroesophageal reflux disease)   . GERD (gastroesophageal reflux disease)   . Arthritis   . Paget's disease of vulva   . Multiple allergies   . Vulvar mass 04/16/2011     Review of Systems Per HPI    Objective:   Physical Exam  Nursing note and vitals reviewed. Constitutional: She appears well-developed and well-nourished. No distress.  HENT:  Head: Normocephalic and atraumatic.  Right Ear: Hearing, tympanic membrane, external ear and ear canal normal.  Left Ear: Hearing, tympanic membrane, external ear and ear canal normal.  Nose: No mucosal edema or rhinorrhea. Right sinus exhibits no maxillary sinus tenderness and no frontal sinus tenderness. Left sinus exhibits no maxillary sinus tenderness and no frontal sinus tenderness.  Mouth/Throat: Uvula is midline, oropharynx is clear and moist and mucous membranes are normal. No oropharyngeal exudate, posterior oropharyngeal edema, posterior oropharyngeal erythema or tonsillar abscesses.       congested  Eyes: Conjunctivae normal and EOM are normal. Pupils are equal,  round, and reactive to light. No scleral icterus.  Neck: Normal range of motion. Neck supple.  Cardiovascular: Normal rate, regular rhythm, normal heart sounds and intact distal pulses.   No murmur heard. Pulmonary/Chest: Effort normal and breath sounds normal. No respiratory distress. She has no wheezes. She has no rales.  Lymphadenopathy:    She has no cervical adenopathy.  Skin: Skin is warm and dry. No rash noted.       Assessment & Plan:

## 2012-04-06 ENCOUNTER — Telehealth: Payer: Self-pay | Admitting: Family Medicine

## 2012-04-06 NOTE — Telephone Encounter (Signed)
Noted. Discussed with pt.  Actually sxs seem better over last 1 day.  Clarified that tesslon perls are for cough not for ST.  Recommended she use tessalon perls, may fill abx if worsening or not improved.  Pt agrees.

## 2012-04-06 NOTE — Telephone Encounter (Signed)
Caller: [name]/Patient; Patient Name: [name]; PCP: Excell Seltzer.; Best Callback Phone Number: [phone]  Today, 04/06/2012 , pt calling stating she had OV 04/03/2012 with Dr. Sharen Hones   for cough/ congestion and sinus pressure since 04/01/2012  and he gave rx for Augmentin  and told to fill if  symptoms  not improving.  She feels her symptoms have moved to her chest with coughing though her  headache and sinus pain has improved. She saw her Allergist  as well on 04/04/2012 and she was given Steroid injection,  increased her  Alvesco  Inhaler from  1 to  2  puffs  BID ,  added  Montelukast and told to use Saline Nasal spray ,  Mucinex and Albuterol inhaler has needed. Temp up to 99.6 on 04/04/2012 , but no higher,  but having the coughing spells with  clear  sputum. She  wants to know if she should fill the  Augmentin. RN reached See in 4 hours for having asthma symptoms that have not improved after following provider's instructions. RN  accessed EPIC and no  further appts today and advised pt to be seen  at [facility] now for futher evaluation . Pt  stating she wants a note sent to Dr. Sharen Hones  instead to update him about her symptoms and if she should start the antibiotic.  OFFICE FOLLOW UP REQUEST TO CALL PT BACK ABOUT  ANTIBIOTIC

## 2012-04-10 ENCOUNTER — Telehealth: Payer: Self-pay | Admitting: Family Medicine

## 2012-04-10 ENCOUNTER — Ambulatory Visit (INDEPENDENT_AMBULATORY_CARE_PROVIDER_SITE_OTHER): Payer: BC Managed Care – PPO | Admitting: Family Medicine

## 2012-04-10 ENCOUNTER — Encounter: Payer: Self-pay | Admitting: Family Medicine

## 2012-04-10 VITALS — BP 124/82 | HR 62 | Temp 98.3°F | Wt 175.0 lb

## 2012-04-10 DIAGNOSIS — J209 Acute bronchitis, unspecified: Secondary | ICD-10-CM

## 2012-04-10 NOTE — Progress Notes (Signed)
  Subjective:    Patient ID: [name], female    DOB: May 17, 1951, 61 y.o.   MRN: [id number]  HPI CC: continued sxs  [name] was seen here 04/03/2012 with dx viral URTI given short duration of sxs.  Given her recurrent h/o sinusitis/bronchitis, was provided with script for augmentin and instructed to fill if symptoms worsening or not improving as expected.  Recommended tessalon perls and mucinex at last visit.  sxs started ~03/31/2012, now ongoing for last 10 days.  Continued productive coughing, hoarseness and fatigue.  Did have mild temp to 99.  Feels congested in chest.  Continued PNDrainage.  Tessalon perls help with cough. Taking simple mucinex bid. Has not filled augmentin.  After I saw pt, she saw her allergist.  alvesco increased to 2 puffs daily.  Also singulair was started.  Has decided to get house tested for mold.  Worried + for mold.  H/o recurrent and persistent bronchitis/sinusitis in past.  Has been out of work since 04/02/2012.  May need FMLA form filled.  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  Past Medical History  Diagnosis Date  . 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 S/P partial hysterectomy   . Hypertension   . Headache     Migraines  . GERD (gastroesophageal reflux disease)   . GERD (gastroesophageal reflux disease)   . Arthritis   . Paget's disease of vulva   . Multiple allergies   . Vulvar mass 04/16/2011     Review of Systems Per HPI    Objective:   Physical Exam  Nursing note and vitals reviewed. Constitutional: She appears well-developed and well-nourished. No distress.  HENT:  Head: Normocephalic and atraumatic.  Right Ear: Hearing, tympanic membrane, external ear and ear canal normal.  Left Ear: Hearing, external ear and ear canal normal.  Nose: Nose normal. No mucosal edema or rhinorrhea. Right sinus exhibits no maxillary sinus tenderness and no frontal sinus tenderness. Left sinus exhibits no maxillary sinus  tenderness and no frontal sinus tenderness.  Mouth/Throat: Uvula is midline, oropharynx is clear and moist and mucous membranes are normal. No oropharyngeal exudate, posterior oropharyngeal edema, posterior oropharyngeal erythema or tonsillar abscesses.       Mild fluid behind L TM  Eyes: Conjunctivae normal and EOM are normal. Pupils are equal, round, and reactive to light. No scleral icterus.  Neck: Normal range of motion. Neck supple.  Cardiovascular: Normal rate, regular rhythm, normal heart sounds and intact distal pulses.   No murmur heard. Pulmonary/Chest: Effort normal and breath sounds normal. No respiratory distress. She has no wheezes. She has no rales.       Lungs clear  Lymphadenopathy:    She has no cervical adenopathy.  Skin: Skin is warm and dry. No rash noted.       Assessment & Plan:

## 2012-04-10 NOTE — Telephone Encounter (Signed)
° ° ° °  Caller: [name]/Patient; Patient Name: [name]; PCP: Kerby Nora [facility]); Best Callback Phone Number: [phone] for cough/cold Sx that started 04/01/12 and she saw Dr. Patrice Paradise 04/03/12.  She is still coughing and getting hoarse. Afebrile. Triaged Cough/Adult and all emergent symptoms ruled out.  Needs to be seen in 24 hrs for productive cough with colored sputum.  She is coughing up some yellow sputum and is very exhausted after doing just a few activities.  Home care and call back instructions.   Appt made with Dr. Patrice Paradise for 1415 today. Rescheduled with him as he saw her last week.

## 2012-04-10 NOTE — Assessment & Plan Note (Signed)
Given progression of sxs and duration, will cover for bronchitis with augmentin. Discussed if sxs persist into end of week, rec f/u with Dr. Willa Rough (allergist). Pt agrees with plan. Asked her to fax over forms needed for work.

## 2012-04-10 NOTE — Patient Instructions (Signed)
Let's treat your bronchitis with augmentin. If not improving by the end of this month, follow up with Dr. Willa Rough. We should also know the results of the mold test by then.

## 2012-04-10 NOTE — Telephone Encounter (Signed)
Will see today.  

## 2012-04-13 ENCOUNTER — Telehealth: Payer: Self-pay

## 2012-04-13 NOTE — Telephone Encounter (Signed)
[employer] request medical information for pt;  Spoke with christa and advised needed to submit request in writing; she needed dept that I was trying to reach; I gave info from v/m and she said not enough information and she could not help me.call ended.

## 2012-04-14 ENCOUNTER — Telehealth: Payer: Self-pay | Admitting: Family Medicine

## 2012-04-14 NOTE — Telephone Encounter (Signed)
Caller: [name]/Patient; Patient Name: [name]; PCP: Kerby Nora [facility]); Best Callback Phone Number: [phone] Pt was seen in office for Bronchitis twice by Dr. Patrice Paradise. She is improving. Her cough is better and only coughs a small amount of clear phlegm. She still has hoarseness of the voice. Emergent s/s of URI s/s protocol r/o.  Home care advice given. Pt reports she was instructed to follow up with her allergist and she did have an appointment with them on 04/04/12. Pt wants to decrease Alvesco from twice a day to one a day because she saw one of the side effects can be hoarse voice. The allergist office asked her to call and check with PCP to see if they would advise decreasing Alvesco. They said if she was doing better from the Bronchitis she could go back to Alvesco 1 qd instead of BID but needed opinion from PCP first. Pt also says [employer] has faxed forms concerning work. She is hoping to be able to return to work by Tuesday. She thinks that if she can try Alvesco one a day that might help her with hoarse voice and get to work next week. Please call.

## 2012-04-14 NOTE — Telephone Encounter (Signed)
Patient advised via message on personal cell phone 

## 2012-04-14 NOTE — Telephone Encounter (Signed)
Okay to try lower dose of alvesco. I do not see any paperwork.

## 2012-04-17 ENCOUNTER — Telehealth: Payer: Self-pay | Admitting: Family Medicine

## 2012-04-17 NOTE — Telephone Encounter (Signed)
Caller: Sachi/Patient; Patient Name: [name]; PCP: Kerby Nora [facility]); Best Callback Phone Number: [phone] Health Hx verified per EPIC. Dxed w/ URI and sinus infection 04/03/12. Continues with mild sore throat; pressure in left ear; hoarseness and mild congested, productive cough of clear sputum. Afebrile. Concern today is ongoing hoarseness. Swollen glands under jaw area. Continues on Augmentin and Tessalon. All emergent sxs ruled out per Upper Respiratory Infection protocol w/ exception to 'Localized tender, swollen glands that persist or are unimproved after 14 days.'  Appt scedheduled with Dr. Ermalene Searing for 04/18/12 at 0900.

## 2012-04-18 ENCOUNTER — Encounter: Payer: Self-pay | Admitting: Family Medicine

## 2012-04-18 ENCOUNTER — Ambulatory Visit (INDEPENDENT_AMBULATORY_CARE_PROVIDER_SITE_OTHER): Payer: BC Managed Care – PPO | Admitting: Family Medicine

## 2012-04-18 VITALS — BP 120/78 | HR 61 | Temp 98.2°F | Ht 64.0 in | Wt 174.5 lb

## 2012-04-18 DIAGNOSIS — R490 Dysphonia: Secondary | ICD-10-CM | POA: Insufficient documentation

## 2012-04-18 DIAGNOSIS — J209 Acute bronchitis, unspecified: Secondary | ICD-10-CM

## 2012-04-18 NOTE — Progress Notes (Signed)
  Subjective:    Patient ID: [name], female    DOB: 29-Jan-1951, 61 y.o.   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 Reece Agar on 10/28 and again on 11/4 presetns for follow up given continued hoarseness. She reports cough is much less and less productive.  Still some runny nose. She is on day 8 of augmentin 10 day course. She has also seen her allergist. Alvesco increased to 2 puffs daily. Also singulair was started.   She thought the alvesco may have been worsening the hoarseness so decreased this back without any change.  Last night she stopped alvesco entirely and had an improvement of hoarseness.  Has decided to get house tested for mold. Worried + for mold. H/o recurrent and persistent bronchitis/sinusitis in past.  Has been out of work since 04/02/2012.   She is interested in replacement for alvesco. She has been feeling better overal on inhaled steroid excepts for the hoarseness.   Review of Systems  Constitutional: Negative for fever and fatigue.  HENT: Negative for ear pain.   Eyes: Negative for pain.  Respiratory: Negative for chest tightness and shortness of breath.   Cardiovascular: Negative for chest pain, palpitations and leg swelling.  Gastrointestinal: Negative for abdominal pain.  Genitourinary: Negative for dysuria.       Objective:   Physical Exam  Constitutional: Vital signs are normal. She appears well-developed and well-nourished. She is cooperative.  Non-toxic appearance. She does not appear ill. No distress.  HENT:  Head: Normocephalic.  Right Ear: Hearing, tympanic membrane, external ear and ear canal normal. Tympanic membrane is not erythematous, not retracted and not bulging.  Left Ear: Hearing, tympanic membrane, external ear and ear canal normal. Tympanic membrane is not erythematous, not retracted and not bulging.  Nose: Mucosal edema and rhinorrhea present. Right sinus exhibits no maxillary sinus tenderness and no frontal sinus tenderness. Left  sinus exhibits no maxillary sinus tenderness and no frontal sinus tenderness.  Mouth/Throat: Uvula is midline, oropharynx is clear and moist and mucous membranes are normal.  Eyes: Conjunctivae normal, EOM and lids are normal. Pupils are equal, round, and reactive to light. No foreign bodies found.  Neck: Trachea normal and normal range of motion. Neck supple. Carotid bruit is not present. No mass and no thyromegaly present.  Cardiovascular: Normal rate, regular rhythm, S1 normal, S2 normal, normal heart sounds, intact distal pulses and normal pulses.  Exam reveals no gallop and no friction rub.   No murmur heard. Pulmonary/Chest: Effort normal and breath sounds normal. Not tachypneic. No respiratory distress. She has no decreased breath sounds. She has no wheezes. She has no rhonchi. She has no rales.  Neurological: She is alert.  Skin: Skin is warm, dry and intact. No rash noted.  Psychiatric: Her speech is normal and behavior is normal. Judgment normal. Her mood appears not anxious. Cognition and memory are normal. She does not exhibit a depressed mood.          Assessment & Plan:

## 2012-04-18 NOTE — Assessment & Plan Note (Signed)
Resolving infection , cleared to return to work on 11/13.

## 2012-04-18 NOTE — Assessment & Plan Note (Signed)
Likely mulitfactorial laryngeal edema from infection resolving and irritant of alvesco. Hold alvesco x 1 week then try to restart given such great benefit with this med in last year and no previous SE at lower dose.

## 2012-04-18 NOTE — Patient Instructions (Addendum)
Hold Alvesco while infection improving. Complete antibiotics. Try restarting Alvesco at previous lower dose  in 1 -2 weeks to see if hoarseness returns.

## 2012-05-01 ENCOUNTER — Encounter: Payer: Self-pay | Admitting: Gynecology

## 2012-05-01 ENCOUNTER — Ambulatory Visit: Payer: BC Managed Care – PPO | Attending: Gynecology | Admitting: Gynecology

## 2012-05-01 VITALS — BP 110/68 | HR 86 | Temp 98.4°F | Resp 18 | Ht 64.0 in | Wt 173.2 lb

## 2012-05-01 DIAGNOSIS — IMO0001 Reserved for inherently not codable concepts without codable children: Secondary | ICD-10-CM

## 2012-05-01 DIAGNOSIS — C519 Malignant neoplasm of vulva, unspecified: Secondary | ICD-10-CM | POA: Insufficient documentation

## 2012-05-01 DIAGNOSIS — C4499 Other specified malignant neoplasm of skin, unspecified: Secondary | ICD-10-CM | POA: Insufficient documentation

## 2012-05-01 NOTE — Patient Instructions (Addendum)
Return in 6 months. Prescription for Diflucan 1 tablet (150 mg) to be taken today.

## 2012-05-01 NOTE — Progress Notes (Signed)
Consult Note: Gyn-Onc   [name] 61 y.o. female  Chief Complaint  Patient presents with  . Paget's Disease    Follow up    Interval History: The patient returns today as previously scheduled for followup of vulvar Paget's disease. Since her last visit she's done well. She recently was given a prescription for amoxicillin to treat her bronchitis. This resulted in a yeast vaginitis. She's taken a three-day course of Monistat and seems to be improved but not completely free of symptoms. She denies any bleeding. She denies any new vulvar lesions.  HPI: The patient initially underwent surgery for Paget's disease of the vulva in October 2011. There was focal involvement of the medial margin. The entire lesion is approximately 5 cm in diameter.  Review of Systems:10 point review of systems is negative as noted above.   Vitals: Blood pressure 110/68, pulse 86, temperature 98.4 F (36.9 C), temperature source Oral, resp. rate 18, height 5\' 4"  (1.626 m), weight 173 lb 3.2 oz (78.563 kg).  Physical Exam: General : The patient is a healthy woman in no acute distress.  HEENT: normocephalic, extraoccular movements normal; neck is supple without thyromegally  Lynphnodes: Supraclavicular and inguinal nodes not enlarged  Abdomen: Soft, non-tender, no ascites, no organomegally, no masses, no hernias  Pelvic:  EGBUS: Normal female there are no lesions noted. The right labia minora has been surgically removed. Vagina: Normal, no lesions  Urethra and Bladder: Normal, non-tender  Cervix: Surgically absent  Uterus: Surgically absent  Bi-manual examination: Non-tender; no adenxal masses or nodularity  Rectal: normal sphincter tone, no masses, no blood  Lower extremities: No edema or varicosities. Normal range of motion    Assessment/Plan: Paget's disease of vulva no evidence of recurrent disease  Yeast vulvovaginitis. The patient is given a prescription for Diflucan 150 mg 1 tablet staff.  Return  to GYN oncology clinic in 6 months.  Allergies  Allergen Reactions  . Other     Allergic to toothpaste- makes mouth peel Hay fever/dust mites/trees "365 allergic"   . Azithromycin Itching and Rash  . Sulfa Antibiotics Hives and Rash    Past Medical History  Diagnosis Date  . 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 S/P partial hysterectomy   . Hypertension   . Headache     Migraines  . GERD (gastroesophageal reflux disease)   . GERD (gastroesophageal reflux disease)   . Arthritis   . Paget's disease of vulva   . Multiple allergies   . Vulvar mass 04/16/2011    Past Surgical History  Procedure Date  . Anterior and posterior repair 1989  . Simple vulvectomy 2011    right side  . Tonsilectomy, adenoidectomy, bilateral myringotomy and tubes   . Wisdom tooth extraction   . Hand surgery 2012    Left hand, ring finger cyst removal, trigger finger surg x 3 fingers  . Vaginal hysterectomy 1989    for prolapse  . Trigger finger surgery     Current Outpatient Prescriptions  Medication Sig Dispense Refill  . albuterol (PROVENTIL HFA;VENTOLIN HFA) 108 (90 BASE) MCG/ACT inhaler Inhale 2 puffs into the lungs every 6 (six) hours as needed. For wheezing      . ciclesonide (ALVESCO) 160 MCG/ACT inhaler Inhale 2 puffs into the lungs 2 (two) times daily.        . fluticasone (FLONASE) 50 MCG/ACT nasal spray Place 2 sprays into the nose daily.        Marland Kitchen  lansoprazole (PREVACID) 30 MG capsule Take 1 capsule (30 mg total) by mouth daily.  90 capsule  3  . loratadine (CLARITIN) 10 MG tablet Take 10 mg by mouth daily.        Marland Kitchen losartan-hydrochlorothiazide (HYZAAR) 100-25 MG per tablet Take 1 tablet by mouth daily.  90 tablet  3  . metoprolol tartrate (LOPRESSOR) 12.5 mg TABS 6.25 mg 2 (two) times daily. 1/2 tab po daily      . montelukast (SINGULAIR) 10 MG tablet Take 10 mg by mouth at bedtime.      Marland Kitchen acetaminophen (TYLENOL) 325 MG tablet Take 650 mg by mouth every 6 (six) hours as  needed. For pain      . benzonatate (TESSALON) 100 MG capsule Take 1 capsule (100 mg total) by mouth 3 (three) times daily as needed for cough.  30 capsule  0  . Olopatadine HCl (PATANASE) 0.6 % SOLN Place into the nose.      . SUMAtriptan (IMITREX) 50 MG tablet Take 50 mg by mouth every 2 (two) hours as needed.          History   Social History  . Marital Status: Married    Spouse Name: N/A    Number of Children: N/A  . Years of Education: N/A   Occupational History  . Not on file.   Social History Main Topics  . Smoking status: Former Smoker    Quit date: 04/16/1991  . Smokeless tobacco: Not on file  . Alcohol Use: Yes     Comment: occasional wine  . Drug Use: No  . Sexually Active: Not on file   Other Topics Concern  . Not on file   Social History Narrative  . No narrative on file    Family History  Problem Relation Age of Onset  . Cancer Maternal Grandmother   . Hypertension Father   . Heart disease Father   . Hypertension Mother   . Heart disease Mother   . Cancer Paternal Grandmother     breast cancer  . Colon cancer Paternal Ozzie Hoyle, MD 05/01/2012, 4:15 PM

## 2012-05-18 NOTE — Telephone Encounter (Signed)
noted 

## 2012-05-28 ENCOUNTER — Emergency Department (HOSPITAL_COMMUNITY): Payer: BC Managed Care – PPO

## 2012-05-28 ENCOUNTER — Encounter (HOSPITAL_COMMUNITY): Payer: Self-pay | Admitting: *Deleted

## 2012-05-28 ENCOUNTER — Emergency Department (HOSPITAL_COMMUNITY)
Admission: EM | Admit: 2012-05-28 | Discharge: 2012-05-29 | Disposition: A | Discharge status: Home / Self Care | Payer: BC Managed Care – PPO | Attending: Emergency Medicine | Admitting: Emergency Medicine

## 2012-05-28 DIAGNOSIS — Z79899 Other long term (current) drug therapy: Secondary | ICD-10-CM | POA: Insufficient documentation

## 2012-05-28 DIAGNOSIS — R0982 Postnasal drip: Secondary | ICD-10-CM | POA: Insufficient documentation

## 2012-05-28 DIAGNOSIS — E876 Hypokalemia: Secondary | ICD-10-CM

## 2012-05-28 DIAGNOSIS — K219 Gastro-esophageal reflux disease without esophagitis: Secondary | ICD-10-CM | POA: Insufficient documentation

## 2012-05-28 DIAGNOSIS — M129 Arthropathy, unspecified: Secondary | ICD-10-CM | POA: Insufficient documentation

## 2012-05-28 DIAGNOSIS — Z87891 Personal history of nicotine dependence: Secondary | ICD-10-CM | POA: Insufficient documentation

## 2012-05-28 DIAGNOSIS — J209 Acute bronchitis, unspecified: Secondary | ICD-10-CM | POA: Insufficient documentation

## 2012-05-28 DIAGNOSIS — J069 Acute upper respiratory infection, unspecified: Secondary | ICD-10-CM | POA: Insufficient documentation

## 2012-05-28 DIAGNOSIS — Z8742 Personal history of other diseases of the female genital tract: Secondary | ICD-10-CM | POA: Insufficient documentation

## 2012-05-28 DIAGNOSIS — IMO0001 Reserved for inherently not codable concepts without codable children: Secondary | ICD-10-CM | POA: Insufficient documentation

## 2012-05-28 DIAGNOSIS — J3489 Other specified disorders of nose and nasal sinuses: Secondary | ICD-10-CM | POA: Insufficient documentation

## 2012-05-28 DIAGNOSIS — I10 Essential (primary) hypertension: Secondary | ICD-10-CM | POA: Insufficient documentation

## 2012-05-28 DIAGNOSIS — R059 Cough, unspecified: Secondary | ICD-10-CM | POA: Insufficient documentation

## 2012-05-28 DIAGNOSIS — R05 Cough: Secondary | ICD-10-CM | POA: Insufficient documentation

## 2012-05-28 DIAGNOSIS — J309 Allergic rhinitis, unspecified: Secondary | ICD-10-CM | POA: Insufficient documentation

## 2012-05-28 DIAGNOSIS — R5381 Other malaise: Secondary | ICD-10-CM | POA: Insufficient documentation

## 2012-05-28 LAB — CBC WITH DIFFERENTIAL/PLATELET
Basophils Absolute: 0 10*3/uL (ref 0.0–0.1)
Eosinophils Absolute: 0 10*3/uL (ref 0.0–0.7)
Eosinophils Relative: 0 % (ref 0–5)
HCT: 33.6 % — ABNORMAL LOW (ref 36.0–46.0)
MCH: 31.4 pg (ref 26.0–34.0)
MCV: 91.1 fL (ref 78.0–100.0)
Monocytes Absolute: 0.5 10*3/uL (ref 0.1–1.0)
Platelets: 200 10*3/uL (ref 150–400)
RDW: 12.7 % (ref 11.5–15.5)

## 2012-05-28 LAB — POCT I-STAT, CHEM 8
Calcium, Ion: 1.09 mmol/L — ABNORMAL LOW (ref 1.12–1.23)
Creatinine, Ser: 0.8 mg/dL (ref 0.50–1.10)
Hemoglobin: 11.9 g/dL — ABNORMAL LOW (ref 12.0–15.0)
Sodium: 134 mEq/L — ABNORMAL LOW (ref 135–145)
TCO2: 26 mmol/L (ref 0–100)

## 2012-05-28 IMAGING — CR DG CHEST 2V
2 series · 2 of 2 positions shown · non-contrast
Comparison: Chest radiograph performed [DATE]

CLINICAL DATA: Fever and cough.

CHEST - 2 VIEW

[w chest pa]
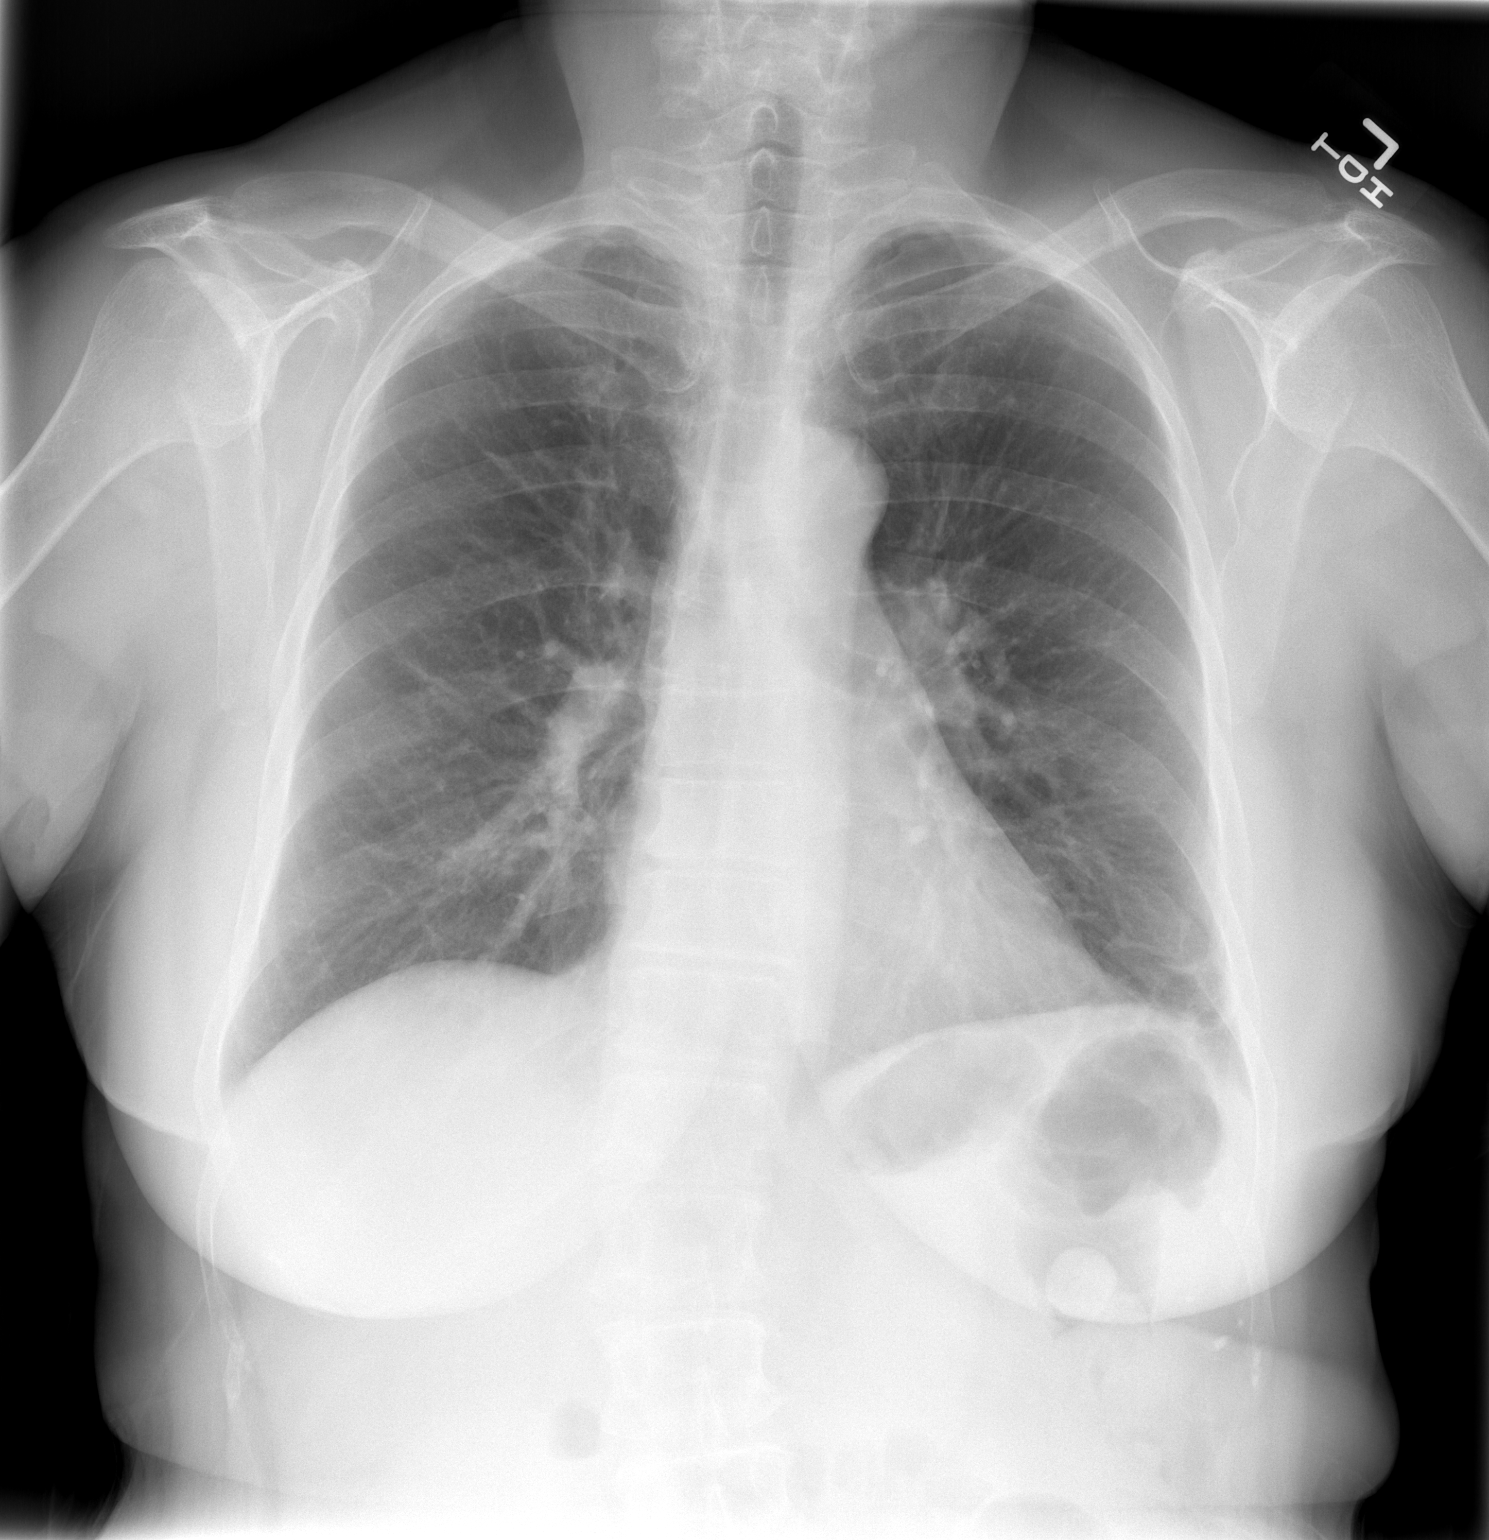

[w chest lat]
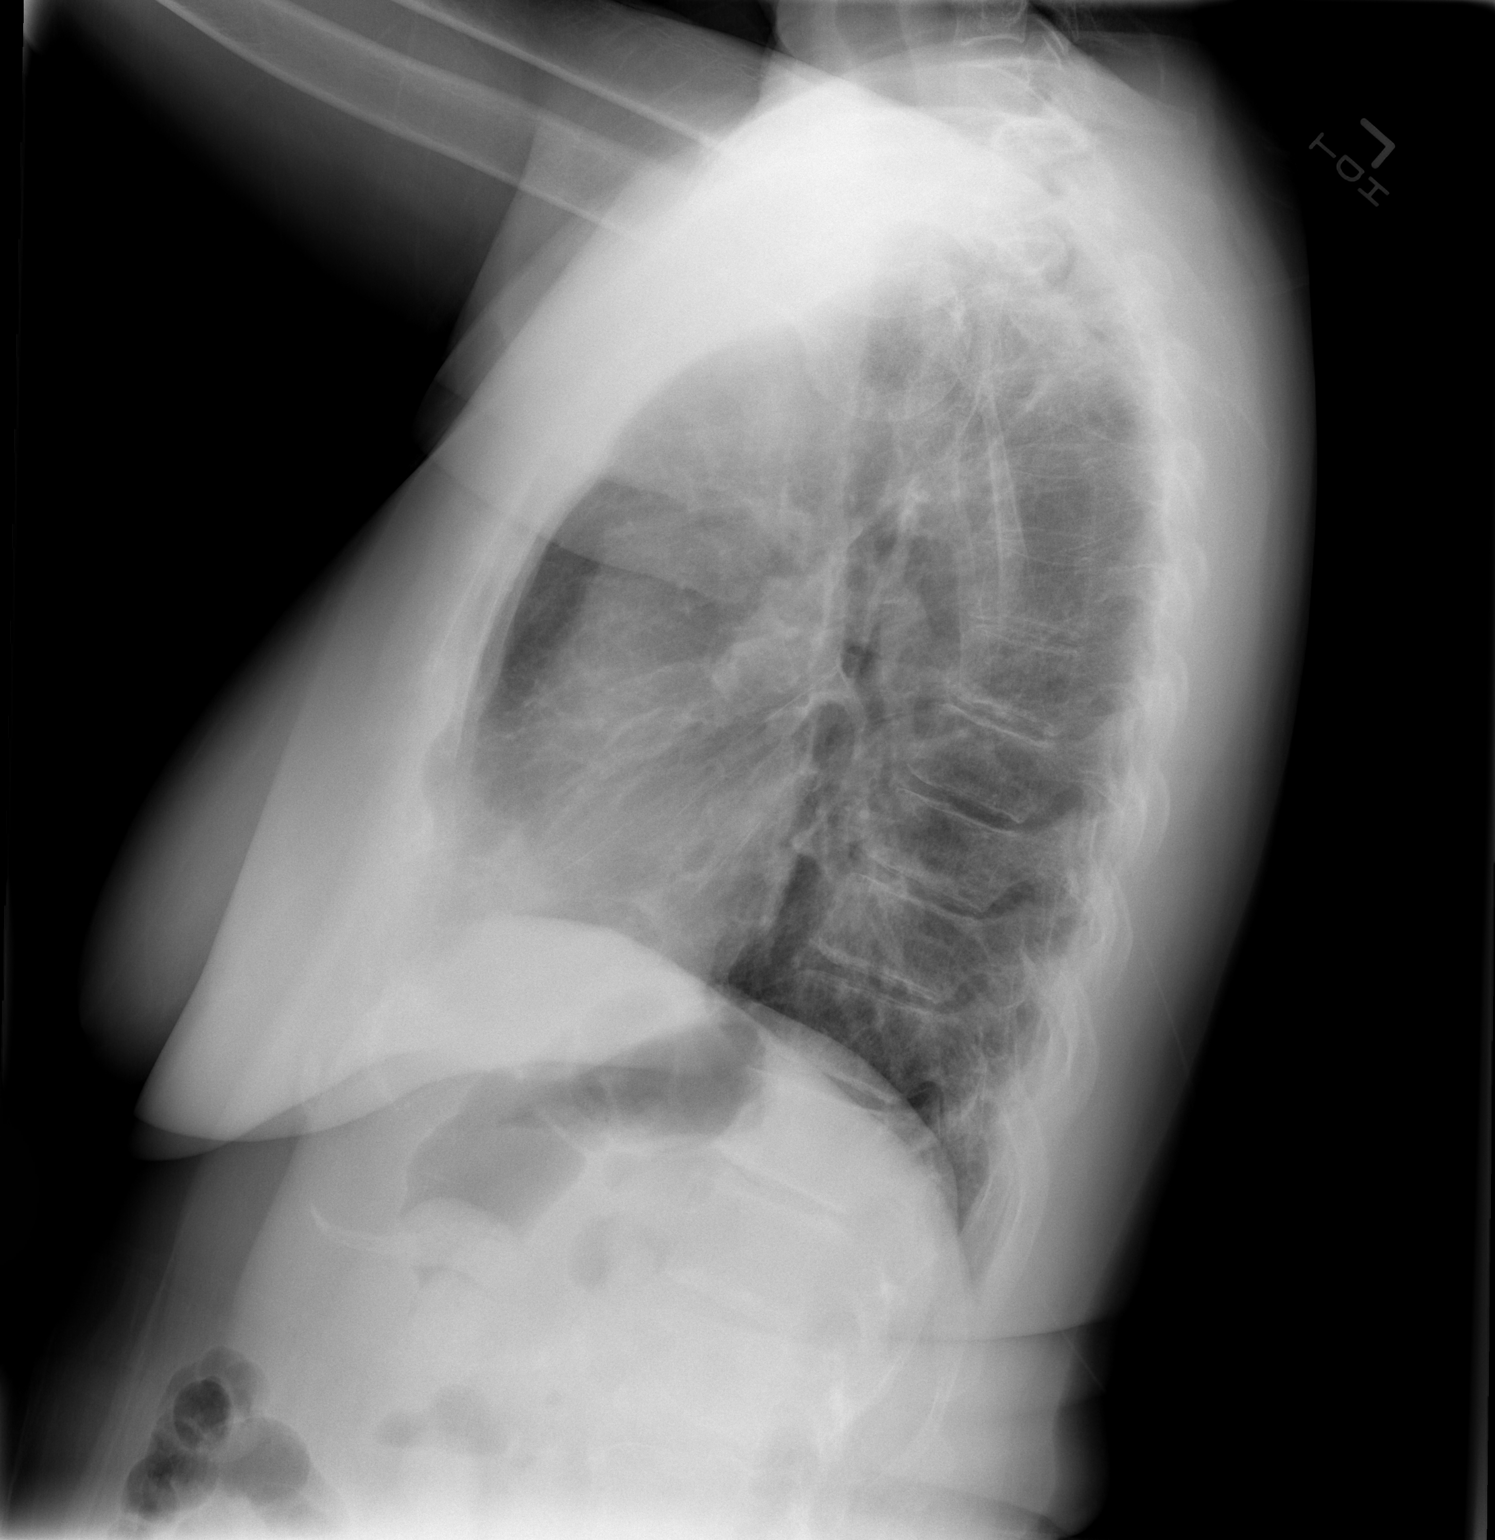

[2 of 2 positions shown; findings below may reference images not displayed]

FINDINGS: The lungs are well-aerated.  Minimal left basilar
atelectasis or scarring is noted.  There is no evidence of pleural
effusion or pneumothorax.

The heart is normal in size; the mediastinal contour is within
normal limits.  No acute osseous abnormalities are seen.
IMPRESSION: Minimal left basilar atelectasis or scarring noted; lungs otherwise
clear.

## 2012-05-28 MED ORDER — ALBUTEROL SULFATE (5 MG/ML) 0.5% IN NEBU
5.0000 mg | INHALATION_SOLUTION | Freq: Once | RESPIRATORY_TRACT | Status: AC
  Administered 2012-05-28: 5 mg via RESPIRATORY_TRACT
  Filled 2012-05-28: qty 1

## 2012-05-28 NOTE — ED Notes (Addendum)
Pt reports fever, coughing, and headache since last night. States that fever was as high as 104.0. Present oral temp is 100.0. Pt reports taking tylenol around 9pm this evening. Pt reports a productive cough of yellow-green sputum. Reports headache has dissipated, but chest wall pain from coughing is 3/10.

## 2012-05-28 NOTE — ED Notes (Addendum)
C/o fever and cough, onset Friday. Describes cough as initially dry, now productive, with pink tinge,  also HA. (Denies: sob, nvd). Has tried mucinex. Easily provoked cough with inspiration.

## 2012-05-29 ENCOUNTER — Telehealth: Payer: Self-pay | Admitting: Family Medicine

## 2012-05-29 MED ORDER — POTASSIUM CHLORIDE CRYS ER 20 MEQ PO TBCR
40.0000 meq | EXTENDED_RELEASE_TABLET | Freq: Once | ORAL | Status: AC
  Administered 2012-05-29: 40 meq via ORAL
  Filled 2012-05-29: qty 2

## 2012-05-29 MED ORDER — ALBUTEROL SULFATE HFA 108 (90 BASE) MCG/ACT IN AERS
2.0000 | INHALATION_SPRAY | Freq: Once | RESPIRATORY_TRACT | Status: AC
  Administered 2012-05-29: 2 via RESPIRATORY_TRACT
  Filled 2012-05-29: qty 6.7

## 2012-05-29 MED ORDER — GUAIFENESIN 100 MG/5ML PO LIQD: 100.0000 mg | ORAL | Status: DC | PRN

## 2012-05-29 MED ORDER — PREDNISONE 20 MG PO TABS: 40.0000 mg | ORAL_TABLET | Freq: Every day | ORAL | Status: DC

## 2012-05-29 NOTE — ED Provider Notes (Signed)
History     CSN: [id number]  Arrival date & time 05/28/12  2126   First MD Initiated Contact with Patient 05/29/12 0030      Chief Complaint  Patient presents with  . Fever  . Cough    (Consider location/radiation/quality/duration/timing/severity/associated sxs/prior treatment) Patient is a 61 y.o. female presenting with fever and cough. The history is provided by the patient. No language interpreter was used.  Fever Primary symptoms of the febrile illness include fever, fatigue, cough and myalgias. Primary symptoms do not include visual change, headaches, wheezing, shortness of breath, abdominal pain, nausea, vomiting, diarrhea, dysuria, altered mental status, arthralgias or rash. The current episode started today. This is a new problem. The problem has not changed since onset. The fever began today. The fever has been gradually improving since its onset. The maximum temperature recorded prior to her arrival was 103 to 104 F.  The cough began today. The cough is new. The cough is non-productive.  Cough Associated symptoms include chills, rhinorrhea and myalgias. Pertinent negatives include no chest pain, no ear pain, no headaches, no sore throat, no shortness of breath and no wheezing.    Past Medical History  Diagnosis Date  . 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 S/P partial hysterectomy   . Hypertension   . Headache     Migraines  . GERD (gastroesophageal reflux disease)   . GERD (gastroesophageal reflux disease)   . Arthritis   . Paget's disease of vulva   . Multiple allergies   . Vulvar mass 04/16/2011    Past Surgical History  Procedure Date  . Anterior and posterior repair 1989  . Simple vulvectomy 2011    right side  . Tonsilectomy, adenoidectomy, bilateral myringotomy and tubes   . Wisdom tooth extraction   . Hand surgery 2012    Left hand, ring finger cyst removal, trigger finger surg x 3 fingers  . Vaginal hysterectomy 1989    for prolapse   . Trigger finger surgery     Family History  Problem Relation Age of Onset  . Cancer Maternal Grandmother   . Hypertension Father   . Heart disease Father   . Hypertension Mother   . Heart disease Mother   . Cancer Paternal Grandmother     breast cancer  . Colon cancer Paternal Uncle     History  Substance Use Topics  . Smoking status: Former Smoker    Quit date: 04/16/1991  . Smokeless tobacco: Not on file  . Alcohol Use: Yes     Comment: occasional wine    OB History    Grav Para Term Preterm Abortions TAB SAB Ect Mult Living                  Review of Systems  Constitutional: Positive for fever, chills and fatigue. Negative for diaphoresis and appetite change.  HENT: Positive for congestion, rhinorrhea and postnasal drip. Negative for ear pain, nosebleeds, sore throat, facial swelling, sneezing, trouble swallowing, neck pain and neck stiffness.   Eyes: Negative.   Respiratory: Positive for cough. Negative for choking, chest tightness, shortness of breath and wheezing.   Cardiovascular: Negative for chest pain, palpitations and leg swelling.  Gastrointestinal: Negative for nausea, vomiting, abdominal pain and diarrhea.  Genitourinary: Negative.  Negative for dysuria.  Musculoskeletal: Positive for myalgias. Negative for arthralgias.  Skin: Negative for color change and rash.  Neurological: Negative.  Negative for headaches.  Hematological: Negative.   Psychiatric/Behavioral:  Negative.  Negative for altered mental status.    Allergies  Ibuprofen; Naprosyn; Other; Azithromycin; and Sulfa antibiotics  Home Medications   Current Outpatient Rx  Name  Route  Sig  Dispense  Refill  . ACETAMINOPHEN ER 650 MG PO TBCR   Oral   Take 650 mg by mouth every 4 (four) hours as needed. For pain/fever         . ALBUTEROL SULFATE HFA 108 (90 BASE) MCG/ACT IN AERS   Inhalation   Inhale 2 puffs into the lungs every 6 (six) hours as needed. For wheezing         .  CICLESONIDE 160 MCG/ACT IN AERS   Inhalation   Inhale 2 puffs into the lungs every evening.          Marland Kitchen FLUTICASONE PROPIONATE 0.05 % EX CREA   Topical   Apply 1 application topically 2 (two) times daily. For rash on stomach         . FLUTICASONE PROPIONATE 50 MCG/ACT NA SUSP   Nasal   Place 1 spray into the nose daily.          Marland Kitchen KETOCONAZOLE 2 % EX CREA   Topical   Apply 1 application topically 2 (two) times daily. For rash on cream         . LANSOPRAZOLE 30 MG PO CPDR   Oral   Take 1 capsule (30 mg total) by mouth daily.   90 capsule   3   . LORATADINE 10 MG PO TABS   Oral   Take 10 mg by mouth every evening.          Marland Kitchen LOSARTAN POTASSIUM-HCTZ 100-25 MG PO TABS   Oral   Take 1 tablet by mouth daily.   90 tablet   3   . METOPROLOL TARTRATE 25 MG PO TABS   Oral   Take 6.25 mg by mouth 2 (two) times daily.         Marland Kitchen MONTELUKAST SODIUM 10 MG PO TABS   Oral   Take 10 mg by mouth every morning.          . OLOPATADINE HCL 0.6 % NA SOLN   Nasal   Place 1-2 puffs into the nose daily.            BP 128/72  Pulse 96  Temp 100 F (37.8 C) (Oral)  Resp 20  SpO2 95%  Physical Exam  Nursing note and vitals reviewed. Constitutional: She is oriented to person, place, and time. She appears well-developed and well-nourished. No distress.  HENT:  Head: Normocephalic and atraumatic.  Right Ear: External ear normal.  Left Ear: External ear normal.  Nose: Nose normal.  Mouth/Throat: Oropharynx is clear and moist. No oropharyngeal exudate.  Eyes: Conjunctivae normal are normal. Pupils are equal, round, and reactive to light. Right eye exhibits no discharge. Left eye exhibits no discharge. No scleral icterus.  Neck: Normal range of motion. Neck supple. No JVD present. No tracheal deviation present.  Cardiovascular: Normal rate, regular rhythm, normal heart sounds and intact distal pulses.  Exam reveals no gallop and no friction rub.   No murmur  heard. Pulmonary/Chest: Effort normal. No stridor. No respiratory distress. She has wheezes (faint expiratory). She has no rales. She exhibits no tenderness.  Abdominal: Soft. Bowel sounds are normal. She exhibits no distension and no mass. There is no tenderness. There is no rebound and no guarding.  Musculoskeletal: Normal range of motion. She exhibits no edema  and no tenderness.  Lymphadenopathy:    She has no cervical adenopathy.  Neurological: She is alert and oriented to person, place, and time. No cranial nerve deficit.  Skin: Skin is warm and dry. No rash noted. She is not diaphoretic. No erythema. No pallor.  Psychiatric: Her behavior is normal.    ED Course  Procedures (including critical care time)  Labs Reviewed  CBC WITH DIFFERENTIAL - Abnormal; Notable for the following:    RBC 3.69 (*)     Hemoglobin 11.6 (*)     HCT 33.6 (*)     Neutrophils Relative 84 (*)     Lymphocytes Relative 7 (*)     Lymphs Abs 0.4 (*)     All other components within normal limits  POCT I-STAT, CHEM 8 - Abnormal; Notable for the following:    Sodium 134 (*)     Potassium 2.9 (*)     Glucose, Bld 117 (*)     Calcium, Ion 1.09 (*)     Hemoglobin 11.9 (*)     HCT 35.0 (*)     All other components within normal limits   Dg Chest 2 View  05/28/2012  *RADIOLOGY REPORT*  Clinical Data: Fever and cough.  CHEST - 2 VIEW  Comparison: Chest radiograph performed 08/14/2011  Findings: The lungs are well-aerated.  Minimal left basilar atelectasis or scarring is noted.  There is no evidence of pleural effusion or pneumothorax.  The heart is normal in size; the mediastinal contour is within normal limits.  No acute osseous abnormalities are seen.  IMPRESSION: Minimal left basilar atelectasis or scarring noted; lungs otherwise clear.   Original Report Authenticated By: Tonia Ghent, M.D.      No diagnosis found.    MDM  Pt presents for evaluation of fever, rhinorrhea, and cough.  She appears  nontoxic, note stable VS, NAD.  Note mild expiratory wheezing but note apparent respiratory insufficiency.  She has no leukocytosis.  CXR demonstrates no infiltrate.  Her exam appears to be consistent with a viral URI and a secondary bronchitis.  She has a hx of recurrent bronchitis.  Plan symptomatic care - prn albuterol, tylenol/motrin.  Will also administer po potassium as she has some hypokalemia.  0120.  Pt stable, NAD.  Plan symptomatic care and encouraged close outpt follow-up.  Discussed indications for immediate return to the emergency department.      Tobin Chad, MD 05/29/12 [phone]

## 2012-05-29 NOTE — Telephone Encounter (Signed)
Noted  

## 2012-05-29 NOTE — Telephone Encounter (Signed)
[facility] Triage Call Report Triage Record Num: [id number] Operator: Sula Rumple Patient Name: [name] Call Date & Time: 05/28/2012 8:31:46PM Patient Phone: [phone] PCP: Kerby Nora Patient Gender: Female PCP Fax : [phone] Patient DOB: 13-Jun-1950 Practice Name[facility] Reason for Call: Caller: Scarlett Presto; PCP: Kerby Nora (Family Practice); CB#: [phone]; Call regarding Fever 103.6 (oral), cough/difficulty breathing, headache, runny nose; Pt calling on 05/28/12 states she had a dry cough on 05/27/12 and fever of 100/ today temp is 103.2/has reduced to 102 wtih Tylenol/pt has a headache/cough/chest hurts when she coughs/expectorating yellow mucous/advised to go to ER Protocol(s) Used: Breathing Problems Protocol(s) Used: Upper Respiratory Infection (URI) Recommended Outcome per Protocol: See ED Immediately Reason for Outcome: Breathing symptoms (wheezing, shortness of breath, changes in rates of breathing, skin color changes) main problem New onset of breathing difficulty AND any temperature elevation in an immunocompromised individual or frail elderly Care Advice: ~ 05/28/2012 9:33:40PM Page 1 of 1 [email or web address]

## 2012-06-01 ENCOUNTER — Ambulatory Visit (INDEPENDENT_AMBULATORY_CARE_PROVIDER_SITE_OTHER): Payer: BC Managed Care – PPO | Admitting: Family Medicine

## 2012-06-01 ENCOUNTER — Encounter: Payer: Self-pay | Admitting: Family Medicine

## 2012-06-01 VITALS — BP 120/70 | HR 73 | Temp 98.2°F | Ht 64.0 in | Wt 173.8 lb

## 2012-06-01 DIAGNOSIS — IMO0001 Reserved for inherently not codable concepts without codable children: Secondary | ICD-10-CM

## 2012-06-01 DIAGNOSIS — R509 Fever, unspecified: Secondary | ICD-10-CM

## 2012-06-01 DIAGNOSIS — J1100 Influenza due to unidentified influenza virus with unspecified type of pneumonia: Secondary | ICD-10-CM

## 2012-06-01 LAB — POCT INFLUENZA A/B
Influenza A, POC: POSITIVE
Influenza B, POC: POSITIVE

## 2012-06-01 MED ORDER — HYDROCOD POLST-CHLORPHEN POLST 10-8 MG/5ML PO LQCR: 5.0000 mL | Freq: Every evening | ORAL | Status: DC | PRN

## 2012-06-01 MED ORDER — BENZONATATE 100 MG PO CAPS: 100.0000 mg | ORAL_CAPSULE | Freq: Three times a day (TID) | ORAL | Status: DC | PRN

## 2012-06-01 NOTE — Progress Notes (Signed)
[facility] conservation officer at [facility] [address] [place] [address] Phone: [phone] Fax: [phone]  Date:  06/01/2012   Name:  [name]   DOB:  [birth year]   MRN:  [id number] Gender: female Age: 61 y.o.  PCP:  Kerby Nora, MD  Evaluating MD: Hannah Beat, MD   Chief Complaint: Follow-up   History of Present Illness:  [name] is a 61 y.o. pleasant patient who presents with the following:  ER fu, high fever, cough, wheezing: Temp to 103, fever, myalgias, cough - ER felt was bronchitis and gave prednisone and Robitussin:  Here today Still fairly sick. Improved some with prednisnone. Fever has lowered got up some this morning.  Generally feels terrible Not sure about other sick contacts  Dg Chest 2 View  05/28/2012  *RADIOLOGY REPORT*  Clinical Data: Fever and cough.  CHEST - 2 VIEW  Comparison: Chest radiograph performed 08/14/2011  Findings: The lungs are well-aerated.  Minimal left basilar atelectasis or scarring is noted.  There is no evidence of pleural effusion or pneumothorax.  The heart is normal in size; the mediastinal contour is within normal limits.  No acute osseous abnormalities are seen.  IMPRESSION: Minimal left basilar atelectasis or scarring noted; lungs otherwise clear.   Original Report Authenticated By: Tonia Ghent, M.D.     CBC:    Component Value Date/Time   WBC 5.9 05/28/2012 2147   HGB 11.9* 05/28/2012 2206   HCT 35.0* 05/28/2012 2206   PLT 200 05/28/2012 2147   MCV 91.1 05/28/2012 2147   NEUTROABS 4.9 05/28/2012 2147   LYMPHSABS 0.4* 05/28/2012 2147   MONOABS 0.5 05/28/2012 2147   EOSABS 0.0 05/28/2012 2147   BASOSABS 0.0 05/28/2012 2147    Patient Active Problem List  Diagnosis  . IRON DEFICIENCY  . MIGRAINE, COMMON  . HYPERTENSION  . ALLERGIC RHINITIS  . GERD  . PERIMENOPAUSAL SYNDROME  . OSTEOARTHRITIS  . DEGENERATIVE DISC DISEASE, CERVICAL SPINE  . PALPITATIONS, HX OF  . COLONIC POLYPS, ADENOMATOUS, HX OF    . Paget's disease of vulva  . Vulvar mass  . Bradycardia  . Fatigue  . Acute bronchitis  . Hoarseness  . Paget's disease (intraepithelial adenocarcinoma) of vulva    Past Medical History  Diagnosis Date  . 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 S/P partial hysterectomy   . Hypertension   . Headache     Migraines  . GERD (gastroesophageal reflux disease)   . GERD (gastroesophageal reflux disease)   . Arthritis   . Paget's disease of vulva   . Multiple allergies   . Vulvar mass 04/16/2011    Past Surgical History  Procedure Date  . Anterior and posterior repair 1989  . Simple vulvectomy 2011    right side  . Tonsilectomy, adenoidectomy, bilateral myringotomy and tubes   . Wisdom tooth extraction   . Hand surgery 2012    Left hand, ring finger cyst removal, trigger finger surg x 3 fingers  . Vaginal hysterectomy 1989    for prolapse  . Trigger finger surgery     History  Substance Use Topics  . Smoking status: Former Smoker    Quit date: 04/16/1991  . Smokeless tobacco: Not on file  . Alcohol Use: Yes     Comment: occasional wine    Family History  Problem Relation Age of Onset  . Cancer Maternal Grandmother   . Hypertension Father   . Heart disease Father   .  Hypertension Mother   . Heart disease Mother   . Cancer Paternal Grandmother     breast cancer  . Colon cancer Paternal Uncle     Allergies  Allergen Reactions  . Ibuprofen Hypertension  . Naprosyn (Naproxen) Nausea Only  . Other     Allergic to toothpaste- makes mouth peel Hay fever/dust mites/trees "365 allergic"   . Azithromycin Itching and Rash  . Sulfa Antibiotics Hives and Rash    Medication list has been reviewed and updated.  Outpatient Prescriptions Prior to Visit  Medication Sig Dispense Refill  . acetaminophen (TYLENOL) 650 MG CR tablet Take 650 mg by mouth every 4 (four) hours as needed. For pain/fever      . albuterol (PROVENTIL HFA;VENTOLIN HFA) 108 (90 BASE)  MCG/ACT inhaler Inhale 2 puffs into the lungs every 6 (six) hours as needed. For wheezing      . ciclesonide (ALVESCO) 160 MCG/ACT inhaler Inhale 2 puffs into the lungs every evening.       . fluticasone (CUTIVATE) 0.05 % cream Apply 1 application topically 2 (two) times daily. For rash on stomach      . fluticasone (FLONASE) 50 MCG/ACT nasal spray Place 1 spray into the nose daily.       Marland Kitchen guaiFENesin (ROBITUSSIN) 100 MG/5ML liquid Take 5-10 mLs (100-200 mg total) by mouth every 4 (four) hours as needed for cough.  60 mL  0  . ketoconazole (NIZORAL) 2 % cream Apply 1 application topically 2 (two) times daily. For rash on cream      . lansoprazole (PREVACID) 30 MG capsule Take 1 capsule (30 mg total) by mouth daily.  90 capsule  3  . loratadine (CLARITIN) 10 MG tablet Take 10 mg by mouth every evening.       Marland Kitchen losartan-hydrochlorothiazide (HYZAAR) 100-25 MG per tablet Take 1 tablet by mouth daily.  90 tablet  3  . metoprolol tartrate (LOPRESSOR) 25 MG tablet Take 6.25 mg by mouth 2 (two) times daily.      . montelukast (SINGULAIR) 10 MG tablet Take 10 mg by mouth every morning.       . Olopatadine HCl (PATANASE) 0.6 % SOLN Place 1-2 puffs into the nose daily.       . predniSONE (DELTASONE) 20 MG tablet Take 2 tablets (40 mg total) by mouth daily.  10 tablet  0   Last reviewed on 06/01/2012 12:19 PM by Hannah Beat, MD  Review of Systems:  ROS: GEN: Acute illness details above GI: Tolerating PO intake GU: maintaining adequate hydration and urination Pulm: No SOB Interactive and getting along well at home.  Otherwise, ROS is as per the HPI.   Physical Examination: Filed Vitals:   06/01/12 1155  BP: 120/70  Pulse: 73  Temp: 98.2 F (36.8 C)  TempSrc: Oral  Height: 5\' 4"  (1.626 m)  Weight: 173 lb 12 oz (78.812 kg)  SpO2: 96%    Body mass index is 29.82 kg/(m^2). Ideal Body Weight: Weight in (lb) to have BMI = 25: 145.3    GEN: A and O x 3. WDWN. NAD.    ENT: Nose clear,  ext NML.  No LAD.  No JVD.  TM's clear. Oropharynx clear.  PULM: Normal WOB, no distress. B lower lobes with rhonchi and crackles CV: RRR, no M/G/R, No rubs, No JVD.   EXT: warm and well-perfused, No c/c/e. PSYCH: Pleasant and conversant.   Assessment and Plan:  1. Influenza, pneumonia    2. Fever and chills  Influenza A/B   Classic flu history pulm symptoms  The patient's clinical exam and history is consistent with a diagnosis of influenza. Results for orders placed in visit on 06/01/12  POCT INFLUENZA A/B      Component Value Range   Influenza A, POC Positive     Influenza B, POC Positive       Supportive care. Fluids. Cough medicines as needed  Anti-pyretics. Detailed in p/i  Infection control emphasized, including OOW or school until AF 24 hours.   Orders Today:  Orders Placed This Encounter  Procedures  . Influenza A/B    Updated Medication List: (Includes new medications, updates to list, dose adjustments) Meds ordered this encounter  Medications  . chlorpheniramine-HYDROcodone (TUSSIONEX PENNKINETIC ER) 10-8 MG/5ML LQCR    Sig: Take 5 mLs by mouth at bedtime as needed.    Dispense:  240 mL    Refill:  0  . benzonatate (TESSALON) 100 MG capsule    Sig: Take 1 capsule (100 mg total) by mouth 3 (three) times daily as needed for cough.    Dispense:  50 capsule    Refill:  3    Medications Discontinued: There are no discontinued medications.   Hannah Beat, MD

## 2012-06-09 ENCOUNTER — Telehealth: Payer: Self-pay | Admitting: Family Medicine

## 2012-06-09 NOTE — Telephone Encounter (Signed)
Patient is feeling a little better and will follow up if not better by next week

## 2012-06-09 NOTE — Telephone Encounter (Signed)
[facility] Triage Call Report Triage Record Num: [id number] Operator: Jeraldine Loots Patient Name: [name] Call Date & Time: 06/08/2012 6:24:56PM Patient Phone: [phone] PCP: Kerby Nora Patient Gender: Female PCP Fax : [phone] Patient DOB: 07/10/1950 Practice Name: [facility] Reason for Call: Caller: [name]/Patient; PCP: Kerby Nora (Family Practice); CB#: [phone]; Call regarding ongoing flu sx. Was seen in the ED on 12/22 and then in the office on 12/26. Still has a cough but it has improved. Her h/a and fever have resolved but has fatigue. Any small task expends all of her energy. Would feel better being rechecked by MD on Friday. No appts. available in EPIC. PLEASE CALL patient with a Friday appt. time. [phone]. Protocol(s) Used: Flu-Like Symptoms Recommended Outcome per Protocol: Provide Home/Self Care Override Outcome if Used in Protocol: See Provider within 24 hours RN Reason for Override Outcome: Nursing Judgement Used. Reason for Outcome: Flu-like illness AND not in a high risk group Care Advice: ~ INFECTION CONTROL Influenza - Transmission: - Flu virus is spread through the air in droplets when a flu-infected person coughs, sneezes or talks and another person breathes in the droplets, or by touching a surface like a door knob, telephone, or keyboard that has been contaminated by the droplets and then touching the mouth, nose, or eyes. - An individual may be passing on the flu before they have any symptoms. - An individual may continue to be contagious with the flu for up to 7 days after the symptoms start. H1N1 influenza may continue to be contagious as long as cough persists. ~ 06/08/2012 6:37:41PM Page 1 of 1 [id number]

## 2012-06-09 NOTE — Telephone Encounter (Signed)
Call pt. See how she is doing. If still gradually improving I would give it more time.  can follow up early next week if still feeling poorly but expect flu to take time to resolve.

## 2012-06-14 ENCOUNTER — Telehealth: Payer: Self-pay | Admitting: Family Medicine

## 2012-06-14 NOTE — Telephone Encounter (Signed)
Patient Information:  Caller Name: Gentry  Phone: [phone]  Patient: [name], [name]  Gender: Female  DOB: [birth year]  Age: 62 Years  PCP: Kerby Nora [facility])  Office Follow Up:  Does the office need to follow up with this patient?: Yes  Instructions For The Office: OFFICE, PT WAS ASKING ABOUT FORMS THAT SHE NEEDS FILLED OUT FOR BEING OUT OF WORK FOR OVER A WEEK.  THE DEADLINE FOR FORMS TO BE TURNED BACK INTO [employer] IS FRIDAY, 06/16/12.  RN COULD NOT FIND ANY FORMS IN PT CHART REGARDING BEING OUT OF WORK FOR THIS ILLNESS  RN Note:  Pt had called on Friday (06/09/12) and asked about when she should come in for follow up.  Per Dr Ermalene Searing, pt should be seen early this week.  Dr Ermalene Searing is out of the office until 06/16/12.  Appt scheduled for 11:00 on 06/16/12  Symptoms  Reason For Call & Symptoms: pt began with symptoms on 05/27/12.  Pt was diagnosed with the flu on 06/01/12.  Pt is still feeling poorly, wiht fatigue, sweaty feeling  and low temp 99.1  Reviewed Health History In EMR: Yes  Reviewed Medications In EMR: Yes  Reviewed Allergies In EMR: Yes  Reviewed Surgeries / Procedures: Yes  Date of Onset of Symptoms: 04/27/2012  Treatments Tried: pt was not prescribed Tamiflu  Treatments Tried Worked: No  Guideline(s) Used:  No Protocol Available - Sick Adult  Disposition Per Guideline:   See Within 3 Days in Office  Reason For Disposition Reached:   Nursing judgment  Advice Given:  N/A  Appointment Scheduled:  06/16/2012 11:00:00 Appointment Scheduled Provider:  Kerby Nora [facility])

## 2012-06-16 ENCOUNTER — Encounter: Payer: Self-pay | Admitting: Family Medicine

## 2012-06-16 ENCOUNTER — Ambulatory Visit (INDEPENDENT_AMBULATORY_CARE_PROVIDER_SITE_OTHER): Payer: Federal, State, Local not specified - PPO | Admitting: Family Medicine

## 2012-06-16 VITALS — BP 124/80 | HR 70 | Temp 98.2°F | Ht 64.0 in | Wt 175.2 lb

## 2012-06-16 DIAGNOSIS — J111 Influenza due to unidentified influenza virus with other respiratory manifestations: Secondary | ICD-10-CM

## 2012-06-16 NOTE — Telephone Encounter (Signed)
WE are waiting for her to come in correct? We don't need to anything else until then right?

## 2012-06-16 NOTE — Progress Notes (Signed)
  Subjective:    Patient ID: [name], female    DOB: 25-Nov-1950, 62 y.o.   MRN: [id number]  HPI 62 year old female presents for re-evaluation. She was seen by Dr. Salena Saner on 12/26 and diagnosed with flu. Treated with supportive care. She reports today that she is doing better now. She started feeling better  Yesterday. Still slight chills, fatigue. Still with dry cough, Nasal congestion. Left ear fulness, but improving. Last fever was 1/06 off antipyretics.  Needs paperwork completed for FMLA.   Review of Systems  Constitutional: Positive for fatigue. Negative for fever.  HENT: Negative for ear pain.   Eyes: Negative for pain.  Respiratory: Negative for chest tightness and shortness of breath.   Cardiovascular: Negative for chest pain, palpitations and leg swelling.  Gastrointestinal: Negative for abdominal pain.  Genitourinary: Negative for dysuria.       Objective:   Physical Exam  Constitutional: Vital signs are normal. She appears well-developed and well-nourished. She is cooperative.  Non-toxic appearance. She does not appear ill. No distress.  HENT:  Head: Normocephalic.  Right Ear: Hearing, tympanic membrane, external ear and ear canal normal. Tympanic membrane is not erythematous, not retracted and not bulging.  Left Ear: Hearing, tympanic membrane, external ear and ear canal normal. Tympanic membrane is not erythematous, not retracted and not bulging.  Nose: Mucosal edema and rhinorrhea present. Right sinus exhibits no maxillary sinus tenderness and no frontal sinus tenderness. Left sinus exhibits no maxillary sinus tenderness and no frontal sinus tenderness.  Mouth/Throat: Uvula is midline, oropharynx is clear and moist and mucous membranes are normal.  Eyes: Conjunctivae normal, EOM and lids are normal. Pupils are equal, round, and reactive to light. No foreign bodies found.  Neck: Trachea normal and normal range of motion. Neck supple. Carotid bruit is not present. No mass  and no thyromegaly present.  Cardiovascular: Normal rate, regular rhythm, S1 normal, S2 normal, normal heart sounds, intact distal pulses and normal pulses.  Exam reveals no gallop and no friction rub.   No murmur heard. Pulmonary/Chest: Effort normal and breath sounds normal. Not tachypneic. No respiratory distress. She has no decreased breath sounds. She has no wheezes. She has no rhonchi. She has no rales.  Neurological: She is alert.  Skin: Skin is warm, dry and intact. No rash noted.  Psychiatric: Her speech is normal and behavior is normal. Judgment normal. Her mood appears not anxious. Cognition and memory are normal. She does not exhibit a depressed mood.          Assessment & Plan:

## 2012-06-16 NOTE — Assessment & Plan Note (Addendum)
Resolving symptoms. Clear to return to work 06/18/2011. 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 Completed FMLA.

## 2012-06-16 NOTE — Patient Instructions (Addendum)
Rest, supportive care and time.   Return to work 06/17/2012.

## 2012-07-25 ENCOUNTER — Ambulatory Visit (INDEPENDENT_AMBULATORY_CARE_PROVIDER_SITE_OTHER): Payer: BC Managed Care – PPO | Admitting: Family Medicine

## 2012-07-25 ENCOUNTER — Encounter: Payer: Self-pay | Admitting: Family Medicine

## 2012-07-25 VITALS — BP 130/82 | HR 60 | Temp 98.4°F | Ht 64.0 in | Wt 175.5 lb

## 2012-07-25 DIAGNOSIS — J019 Acute sinusitis, unspecified: Secondary | ICD-10-CM

## 2012-07-25 MED ORDER — AMOXICILLIN 500 MG PO CAPS: 1000.0000 mg | ORAL_CAPSULE | Freq: Two times a day (BID) | ORAL | Status: DC

## 2012-07-25 NOTE — Progress Notes (Signed)
  Subjective:    Patient ID: [name], female    DOB: 11/27/1950, 62 y.o.   MRN: [id number]  Cough This is a new problem. The current episode started in the past 7 days (6 days ago). The problem has been gradually worsening. The problem occurs every few minutes. The cough is non-productive. Associated symptoms include a fever, headaches and nasal congestion. Pertinent negatives include no sore throat, shortness of breath or wheezing. Associated symptoms comments:  maxilary pressure Over the weekend she felt tired and tingly, weak at [employer] during standing in choir.. Treatments tried: tessalon perles, nasal saline, flonase, singulair, claritin, has not needed albuterol. The treatment provided mild relief. Her past medical history is significant for asthma, bronchitis and environmental allergies.  Headache  Associated symptoms include coughing and a fever. Pertinent negatives include no sore throat.  Fever  Associated symptoms include coughing and headaches. Pertinent negatives include no sore throat or wheezing.      Review of Systems  Constitutional: Positive for fever.  HENT: Negative for sore throat.   Respiratory: Positive for cough. Negative for shortness of breath and wheezing.   Allergic/Immunologic: Positive for environmental allergies.  Neurological: Positive for headaches.       Objective:   Physical Exam  Constitutional: Vital signs are normal. She appears well-developed and well-nourished. She is cooperative.  Non-toxic appearance. She does not appear ill. No distress.  HENT:  Head: Normocephalic.  Right Ear: Hearing, tympanic membrane, external ear and ear canal normal. Tympanic membrane is not erythematous, not retracted and not bulging.  Left Ear: Hearing, tympanic membrane, external ear and ear canal normal. Tympanic membrane is not erythematous, not retracted and not bulging.  Nose: Mucosal edema and rhinorrhea present. Right sinus exhibits maxillary sinus tenderness  and frontal sinus tenderness. Left sinus exhibits maxillary sinus tenderness and frontal sinus tenderness.  Mouth/Throat: Uvula is midline, oropharynx is clear and moist and mucous membranes are normal.  Eyes: Conjunctivae, EOM and lids are normal. Pupils are equal, round, and reactive to light. No foreign bodies found.  Neck: Trachea normal and normal range of motion. Neck supple. Carotid bruit is not present. No mass and no thyromegaly present.  Cardiovascular: Normal rate, regular rhythm, S1 normal, S2 normal, normal heart sounds, intact distal pulses and normal pulses.  Exam reveals no gallop and no friction rub.   No murmur heard. Pulmonary/Chest: Effort normal and breath sounds normal. Not tachypneic. No respiratory distress. She has no decreased breath sounds. She has no wheezes. She has no rhonchi. She has no rales.  Neurological: She is alert.  Skin: Skin is warm, dry and intact. No rash noted.  Psychiatric: Her speech is normal and behavior is normal. Judgment normal. Her mood appears not anxious. Cognition and memory are normal. She does not exhibit a depressed mood.          Assessment & Plan:

## 2012-07-25 NOTE — Patient Instructions (Addendum)
Continue allery meds, consider mucinex  or mucinex DM, nasal saline irrigation. Avoid decongestants. Start and complete antibiotics. Okay to return to work today.

## 2012-07-25 NOTE — Assessment & Plan Note (Signed)
Hx of recurrent sinus infections and asthma like symptoms. No clear asthma involvement at thie time. Given hostory.. Will treat with amox x 10 days. Continue symptomatic care.

## 2012-08-25 ENCOUNTER — Encounter: Payer: Self-pay | Admitting: Family Medicine

## 2012-08-25 ENCOUNTER — Ambulatory Visit (INDEPENDENT_AMBULATORY_CARE_PROVIDER_SITE_OTHER): Payer: BC Managed Care – PPO | Admitting: Family Medicine

## 2012-08-25 ENCOUNTER — Ambulatory Visit: Payer: BC Managed Care – PPO | Admitting: Internal Medicine

## 2012-08-25 VITALS — BP 120/72 | HR 73 | Temp 98.0°F | Ht 64.0 in | Wt 172.8 lb

## 2012-08-25 DIAGNOSIS — R42 Dizziness and giddiness: Secondary | ICD-10-CM

## 2012-08-25 DIAGNOSIS — H6980 Other specified disorders of Eustachian tube, unspecified ear: Secondary | ICD-10-CM | POA: Insufficient documentation

## 2012-08-25 DIAGNOSIS — J309 Allergic rhinitis, unspecified: Secondary | ICD-10-CM

## 2012-08-25 DIAGNOSIS — J019 Acute sinusitis, unspecified: Secondary | ICD-10-CM

## 2012-08-25 NOTE — Assessment & Plan Note (Signed)
Resolved

## 2012-08-25 NOTE — Assessment & Plan Note (Signed)
Likely cause of symptoms.  No clear sign of bacterial infection. Increase Flonase and if not improving change claritin to allegra.

## 2012-08-25 NOTE — Progress Notes (Signed)
  Subjective:    Patient ID: [name], female    DOB: January 09, 1951, 63 y.o.   MRN: [id number]  HPI  62 year old female with  Allergic rhinitis, asthma, recent sinus infection 1 month ago treat with antibiotics presents with  New onset dizziness ( vertigo) when rolling over in bed in his morning, lasted few seconds. Just this one episode.   She has had sinus symptoms after antibiotics resolve except soreness in right maxillary sinus, sinus pressure on right x few weeks.  No wheeze and SOB, mild cough with drainage. No fever.  No ST.   Using nasal saline wash.. Has noted hard mucus and postnasal dip.  Has noted allergies to dust mites and dust. Using claritin, Singulair, flonase. Patanase for eyes.  Review of Systems  Constitutional: Negative for fever and fatigue.  HENT: Negative for ear pain.   Eyes: Negative for pain.  Respiratory: Negative for chest tightness and shortness of breath.   Cardiovascular: Negative for chest pain, palpitations and leg swelling.  Gastrointestinal: Negative for abdominal pain.  Genitourinary: Negative for dysuria.       Objective:   Physical Exam  Constitutional: Vital signs are normal. She appears well-developed and well-nourished. She is cooperative.  Non-toxic appearance. She does not appear ill. No distress.  HENT:  Head: Normocephalic.  Right Ear: Hearing, tympanic membrane, external ear and ear canal normal. Tympanic membrane is not erythematous, not retracted and not bulging.  Left Ear: Hearing, tympanic membrane, external ear and ear canal normal. Tympanic membrane is not erythematous, not retracted and not bulging.  Nose: No mucosal edema or rhinorrhea. Right sinus exhibits no maxillary sinus tenderness and no frontal sinus tenderness. Left sinus exhibits no maxillary sinus tenderness and no frontal sinus tenderness.  Mouth/Throat: Uvula is midline, oropharynx is clear and moist and mucous membranes are normal.  Eyes: Conjunctivae, EOM and  lids are normal. Pupils are equal, round, and reactive to light. No foreign bodies found.  Neck: Trachea normal and normal range of motion. Neck supple. Carotid bruit is not present. No mass and no thyromegaly present.  Cardiovascular: Normal rate, regular rhythm, S1 normal, S2 normal, normal heart sounds, intact distal pulses and normal pulses.  Exam reveals no gallop and no friction rub.   No murmur heard. Pulmonary/Chest: Effort normal and breath sounds normal. Not tachypneic. No respiratory distress. She has no decreased breath sounds. She has no wheezes. She has no rhonchi. She has no rales.  Abdominal: Soft. Normal appearance and bowel sounds are normal. There is no tenderness.  Neurological: She is alert.  Skin: Skin is warm, dry and intact. No rash noted.  Psychiatric: Her speech is normal and behavior is normal. Judgment and thought content normal. Her mood appears not anxious. Cognition and memory are normal. She does not exhibit a depressed mood.          Assessment & Plan:

## 2012-08-25 NOTE — Patient Instructions (Addendum)
Increase flonase to 2 spray per day. If not improving symptoms change claritin to allegra.

## 2012-08-25 NOTE — Assessment & Plan Note (Signed)
Due to allergies.. Likely causing vertigo. Treat with nasal steroid spray.

## 2012-08-29 ENCOUNTER — Ambulatory Visit: Payer: BC Managed Care – PPO | Admitting: Family Medicine

## 2012-10-25 ENCOUNTER — Other Ambulatory Visit: Payer: Self-pay | Admitting: *Deleted

## 2012-10-25 MED ORDER — LANSOPRAZOLE 30 MG PO CPDR: 30.0000 mg | DELAYED_RELEASE_CAPSULE | Freq: Every day | ORAL | Status: DC

## 2012-11-01 ENCOUNTER — Ambulatory Visit: Payer: BC Managed Care – PPO | Attending: Gynecology | Admitting: Gynecology

## 2012-11-01 ENCOUNTER — Encounter: Payer: Self-pay | Admitting: Gynecology

## 2012-11-01 VITALS — BP 110/70 | HR 64 | Temp 99.1°F | Resp 16 | Ht 64.0 in | Wt 173.9 lb

## 2012-11-01 DIAGNOSIS — Z79899 Other long term (current) drug therapy: Secondary | ICD-10-CM | POA: Insufficient documentation

## 2012-11-01 DIAGNOSIS — C519 Malignant neoplasm of vulva, unspecified: Secondary | ICD-10-CM | POA: Insufficient documentation

## 2012-11-01 DIAGNOSIS — C4499 Other specified malignant neoplasm of skin, unspecified: Secondary | ICD-10-CM

## 2012-11-01 NOTE — Patient Instructions (Signed)
Return to see us in 6 months. 

## 2012-11-01 NOTE — Progress Notes (Signed)
Consult Note: Gyn-Onc   [name] 62 y.o. female  Chief Complaint  Patient presents with  . Paget's    Follow-up visit     Assessment : Paget's disease of the vulva currently in remission.  Plan: The patient return to see me in 6 months  Interval History: Patient returns today as previously scheduled. Since her last visit she's done well. She denies any vulvar symptoms or any vulvar lesions.  HPI:The patient initially underwent surgery for Paget's disease of the vulva in October 2011. There was focal involvement of the medial margin. The entire lesion is approximately 5 cm in diameter.      Review of Systems:10 point review of systems is negative except as noted in interval history.   Vitals: Blood pressure 110/70, pulse 64, temperature 99.1 F (37.3 C), temperature source Oral, resp. rate 16, height 5\' 4"  (1.626 m), weight 173 lb 14.4 oz (78.881 kg).  Physical Exam: General : The patient is a healthy woman in no acute distress.  HEENT: normocephalic, extraoccular movements normal; neck is supple without thyromegally  Lynphnodes: Supraclavicular and inguinal nodes not enlarged  Abdomen: Soft, non-tender, no ascites, no organomegally, no masses, no hernias  Pelvic:  EGBUS: Normal female status post partial vulvectomy with flattening of the right labia minora. No lesions are noted Vagina: Normal, no lesions  Urethra and Bladder: Normal, non-tender  Cervix: Surgically absent  Uterus: Surgically absent  Bi-manual examination: Non-tender; no adenxal masses or nodularity  Rectal: normal sphincter tone, no masses, no blood  Lower extremities: No edema or varicosities. Normal range of motion      Allergies  Allergen Reactions  . Ibuprofen Hypertension  . Naprosyn (Naproxen) Nausea Only  . Other     Allergic to toothpaste- makes mouth peel Hay fever/dust mites/trees "365 allergic"   . Azithromycin Itching and Rash  . Sulfa Antibiotics Hives and Rash    Past Medical  History  Diagnosis Date  . 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 S/P partial hysterectomy   . Hypertension   . Headache(784.0)     Migraines  . GERD (gastroesophageal reflux disease)   . GERD (gastroesophageal reflux disease)   . Arthritis   . Paget's disease of vulva   . Multiple allergies   . Vulvar mass 04/16/2011    Past Surgical History  Procedure Laterality Date  . Anterior and posterior repair  1989  . Simple vulvectomy  2011    right side  . Tonsilectomy, adenoidectomy, bilateral myringotomy and tubes    . Wisdom tooth extraction    . Hand surgery  2012    Left hand, ring finger cyst removal, trigger finger surg x 3 fingers  . Vaginal hysterectomy  1989    for prolapse  . Trigger finger surgery      Current Outpatient Prescriptions  Medication Sig Dispense Refill  . acetaminophen (TYLENOL) 650 MG CR tablet Take 650 mg by mouth every 4 (four) hours as needed. For pain/fever      . albuterol (PROVENTIL HFA;VENTOLIN HFA) 108 (90 BASE) MCG/ACT inhaler Inhale 2 puffs into the lungs every 6 (six) hours as needed. For wheezing      . ciclesonide (ALVESCO) 160 MCG/ACT inhaler Inhale 2 puffs into the lungs every evening.       . fexofenadine (ALLEGRA) 180 MG tablet Take 180 mg by mouth daily.      . fluticasone (CUTIVATE) 0.05 % cream Apply 1 application topically 2 (two) times  daily. For rash on stomach      . fluticasone (FLONASE) 50 MCG/ACT nasal spray Place 1 spray into the nose daily.       Marland Kitchen ketoconazole (NIZORAL) 2 % cream Apply 1 application topically 2 (two) times daily. For rash on cream      . lansoprazole (PREVACID) 30 MG capsule Take 1 capsule (30 mg total) by mouth daily.  90 capsule  3  . losartan-hydrochlorothiazide (HYZAAR) 100-25 MG per tablet Take 1 tablet by mouth daily.  90 tablet  3  . metoprolol tartrate (LOPRESSOR) 25 MG tablet Take 6.25 mg by mouth 2 (two) times daily.      . montelukast (SINGULAIR) 10 MG tablet Take 10 mg by mouth every  morning.       . loratadine (CLARITIN) 10 MG tablet Take 10 mg by mouth every evening.        No current facility-administered medications for this visit.    History   Social History  . Marital Status: Married    Spouse Name: N/A    Number of Children: N/A  . Years of Education: N/A   Occupational History  . Not on file.   Social History Main Topics  . Smoking status: Former Smoker    Quit date: 04/16/1991  . Smokeless tobacco: Not on file  . Alcohol Use: Yes     Comment: occasional wine  . Drug Use: No  . Sexually Active: Not on file   Other Topics Concern  . Not on file   Social History Narrative  . No narrative on file    Family History  Problem Relation Age of Onset  . Cancer Maternal Grandmother   . Hypertension Father   . Heart disease Father   . Hypertension Mother   . Heart disease Mother   . Cancer Paternal Grandmother     breast cancer  . Colon cancer Paternal Ozzie Hoyle, MD 11/01/2012, 2:08 PM

## 2012-11-03 ENCOUNTER — Ambulatory Visit: Payer: BC Managed Care – PPO | Admitting: Gynecology

## 2013-01-25 ENCOUNTER — Ambulatory Visit: Payer: Self-pay | Admitting: Family Medicine

## 2013-01-25 IMAGING — MG MM CAD SCREENING MAMMO
1 series · 4 of 4 positions shown · non-contrast
Comparison: Previous exam(s).

REASON FOR EXAM: scr mammo no order
COMMENTS:

PROCEDURE:     MAM - MAM DGTL SCRN MAM NO ORDER W/CAD  - [DATE]  [DATE]
CLINICAL DATA: Screening.
DIGITAL SCREENING BILATERAL MAMMOGRAM WITH CAD

[L CC · left · 4 of 4 slices shown]
[im 1/4]
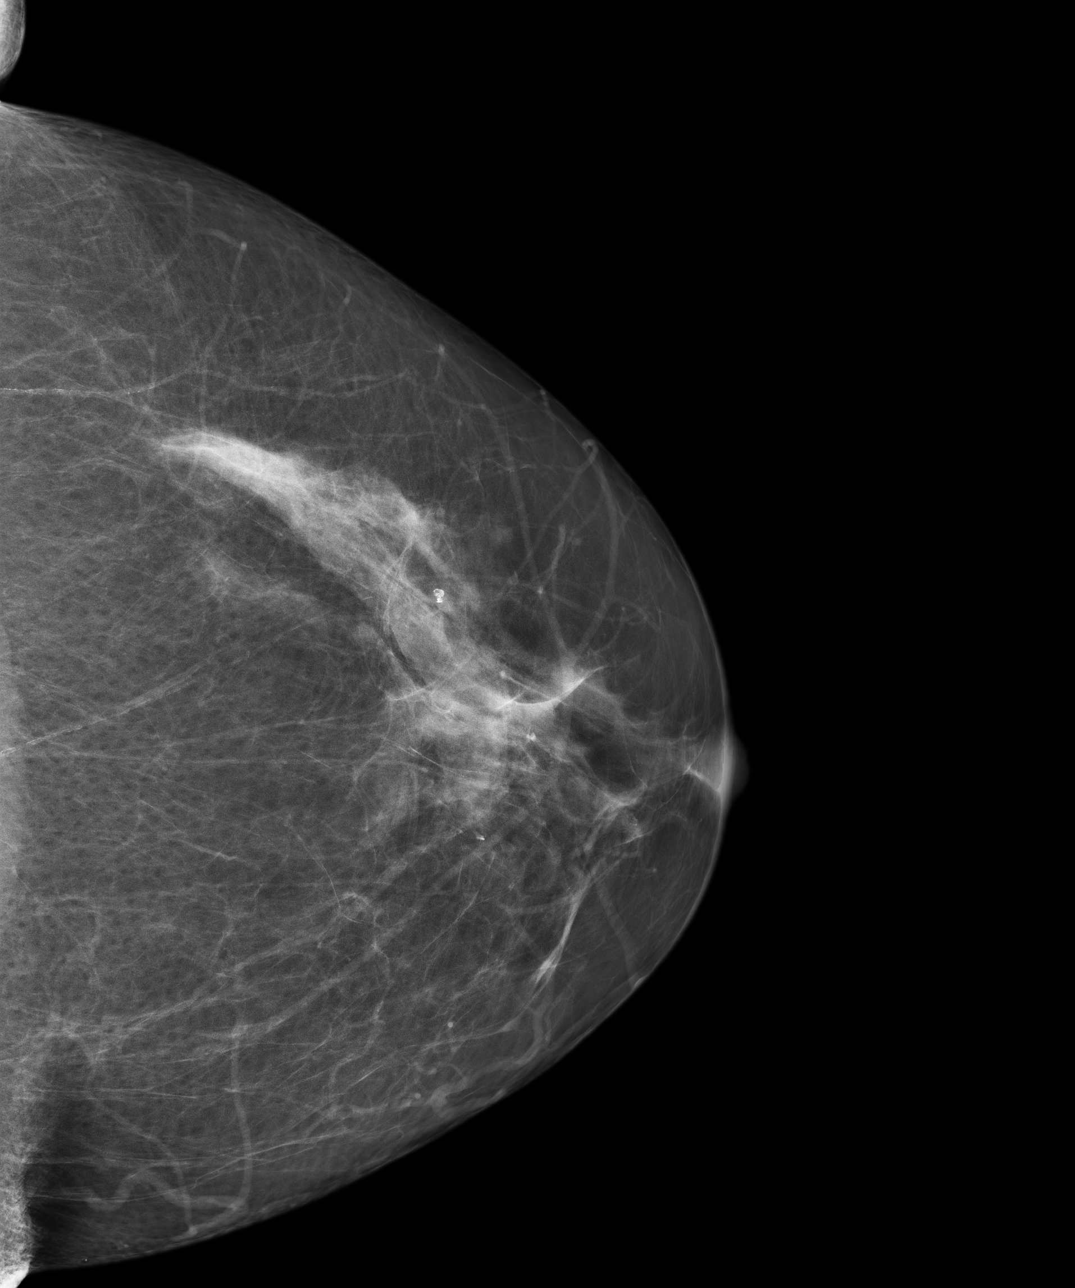
[im 2/4]
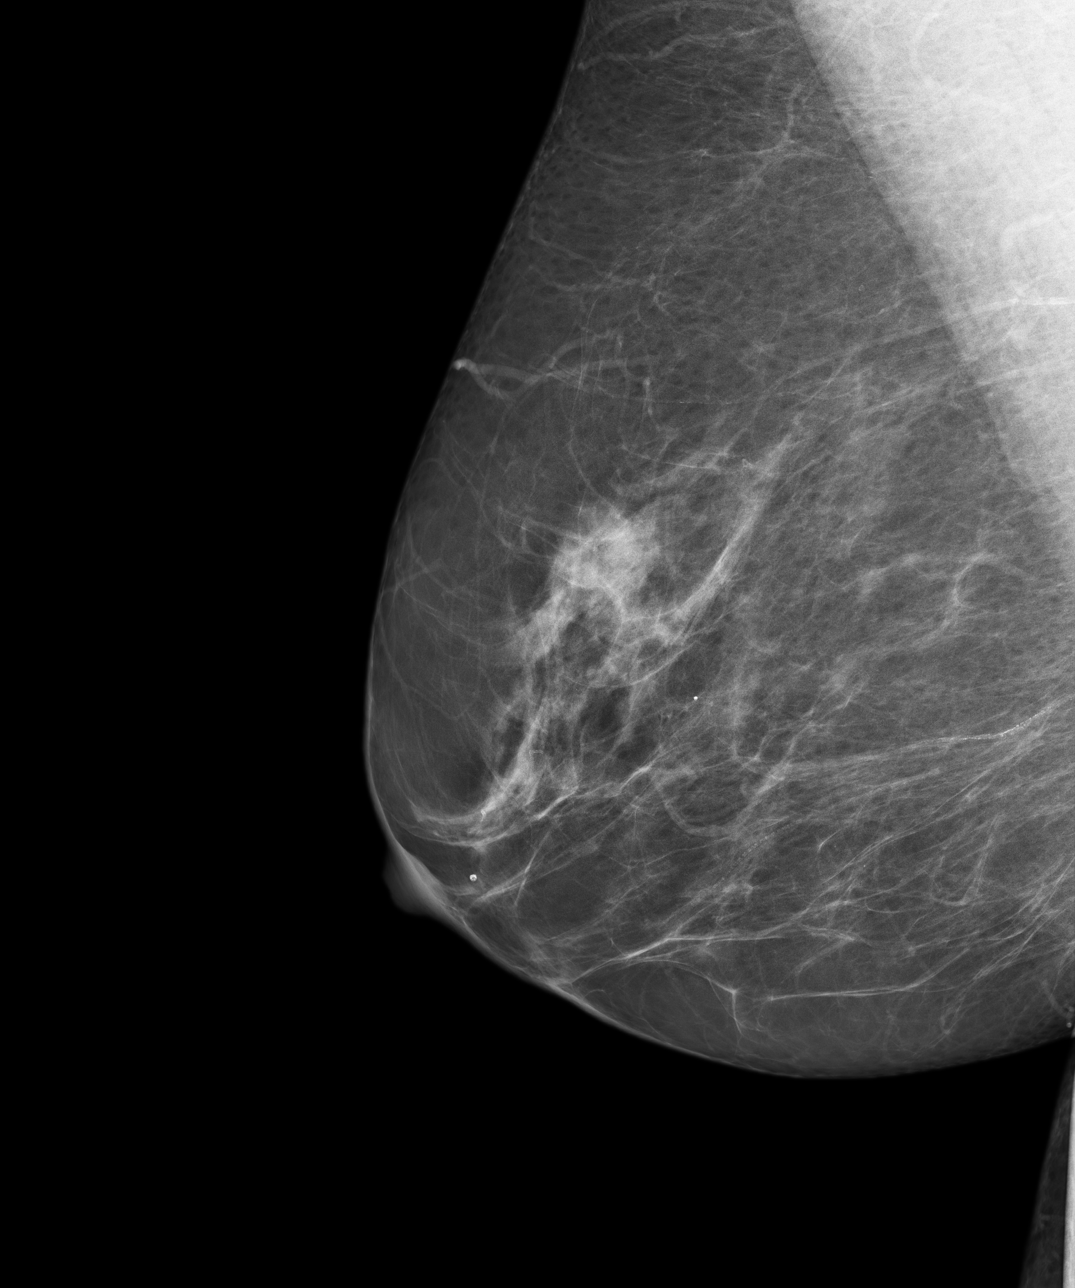
[im 3/4]
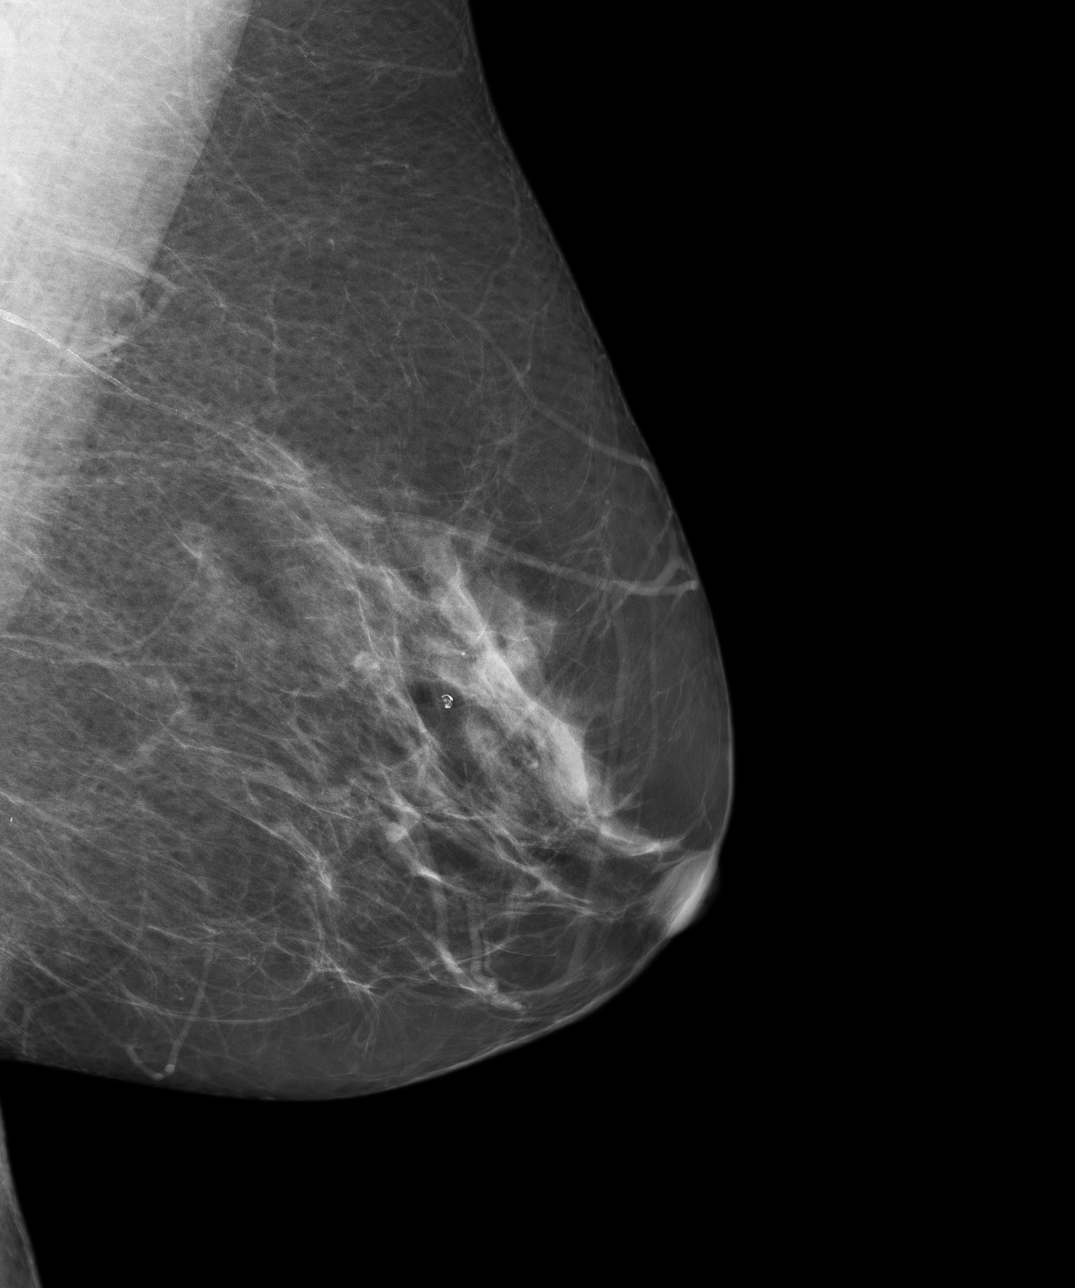
[im 4/4]
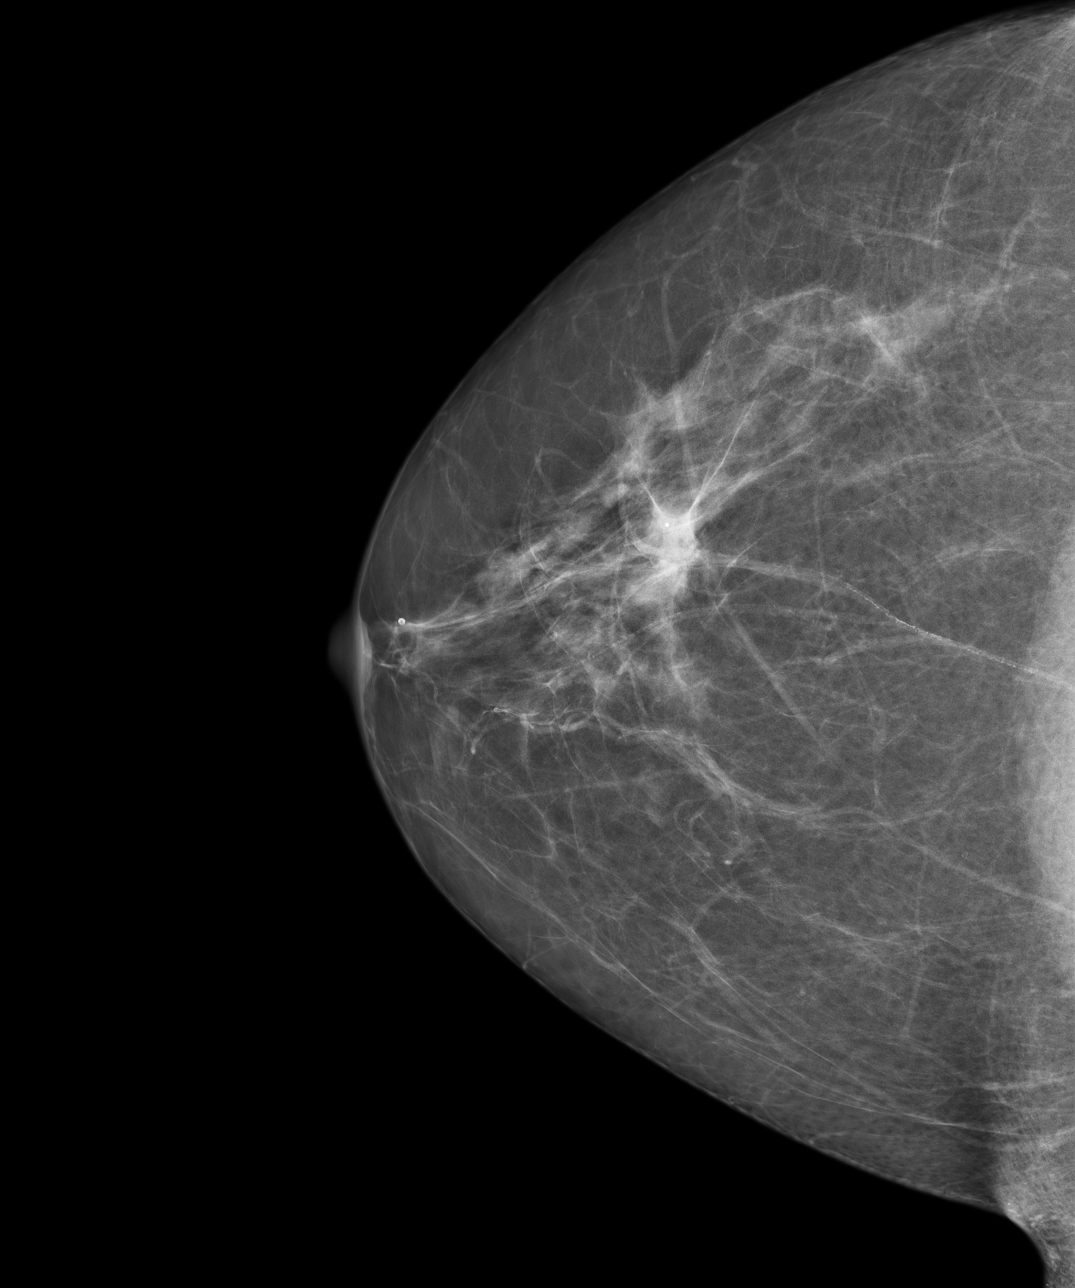

[4 of 4 positions shown; findings below may reference images not displayed]

FINDINGS: ACR Breast Density Category b:  There are scattered areas of
fibroglandular density.

There are no findings suspicious for malignancy.
IMPRESSION: No mammographic evidence of malignancy.

A result letter of this screening mammogram will be mailed directly
to the patient.

RECOMMENDATION:
Screening mammogram in one year. (Code:[TZ])

BI-RADS CATEGORY 1:  Negative.

## 2013-01-30 ENCOUNTER — Telehealth: Payer: Self-pay

## 2013-01-30 NOTE — Telephone Encounter (Signed)
Prior auth required Lansoprazole; [phone] spoke with [name]; [name] said all needed for approval is dx and quantity for 90 day period. GERD and # 90 quantity given and approval from 11/30/12-01/30/14; approval letter to follow. [facility] [employer] notified and med went thru.

## 2013-02-01 ENCOUNTER — Encounter: Payer: Self-pay | Admitting: Family Medicine

## 2013-02-02 ENCOUNTER — Encounter: Payer: Self-pay | Admitting: Family Medicine

## 2013-02-13 ENCOUNTER — Encounter: Payer: Self-pay | Admitting: Family Medicine

## 2013-02-21 ENCOUNTER — Other Ambulatory Visit: Payer: Self-pay | Admitting: Family Medicine

## 2013-04-12 ENCOUNTER — Other Ambulatory Visit: Payer: Self-pay

## 2013-04-19 ENCOUNTER — Encounter: Payer: Self-pay | Admitting: Internal Medicine

## 2013-04-19 ENCOUNTER — Ambulatory Visit (INDEPENDENT_AMBULATORY_CARE_PROVIDER_SITE_OTHER): Payer: BC Managed Care – PPO | Admitting: Internal Medicine

## 2013-04-19 VITALS — BP 128/74 | HR 74 | Temp 99.0°F | Wt 178.5 lb

## 2013-04-19 DIAGNOSIS — J019 Acute sinusitis, unspecified: Secondary | ICD-10-CM

## 2013-04-19 MED ORDER — AMOXICILLIN-POT CLAVULANATE 875-125 MG PO TABS: 1.0000 | ORAL_TABLET | Freq: Two times a day (BID) | ORAL | Status: DC

## 2013-04-19 NOTE — Patient Instructions (Signed)

## 2013-04-19 NOTE — Progress Notes (Signed)
Pre-visit discussion using our clinic review tool. No additional management support is needed unless otherwise documented below in the visit note.  

## 2013-04-19 NOTE — Progress Notes (Signed)
HPI  Pt presents to the clinic today with c/o fever, cough and nasal congestion. This started about 7 days ago. She has been using her nasal rinse which usually works well for her. She also c/o left ear fullness and scratchy throat. She does feel like her symptoms have gotten much worse in the last 24 hours. She is prone to bronchitis and recurrent sinus infection.  She does have a history of allergies but denies breathing problems.  Review of Systems      Past Medical History  Diagnosis Date  . 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 S/P partial hysterectomy   . Hypertension   . Headache(784.0)     Migraines  . GERD (gastroesophageal reflux disease)   . GERD (gastroesophageal reflux disease)   . Arthritis   . Paget's disease of vulva   . Multiple allergies   . Vulvar mass 04/16/2011    Family History  Problem Relation Age of Onset  . Cancer Maternal Grandmother   . Hypertension Father   . Heart disease Father   . Hypertension Mother   . Heart disease Mother   . Cancer Paternal Grandmother     breast cancer  . Colon cancer Paternal Uncle     History   Social History  . Marital Status: Married    Spouse Name: N/A    Number of Children: N/A  . Years of Education: N/A   Occupational History  . Not on file.   Social History Main Topics  . Smoking status: Former Smoker    Quit date: 04/16/1991  . Smokeless tobacco: Not on file  . Alcohol Use: Yes     Comment: occasional wine  . Drug Use: No  . Sexual Activity: Not on file   Other Topics Concern  . Not on file   Social History Narrative  . No narrative on file    Allergies  Allergen Reactions  . Ibuprofen Hypertension  . Naprosyn [Naproxen] Nausea Only  . Other     Allergic to toothpaste- makes mouth peel Hay fever/dust mites/trees "365 allergic"   . Azithromycin Itching and Rash  . Sulfa Antibiotics Hives and Rash     Constitutional: Positive headache, fatigue and fever. Denies abrupt  weight changes.  HEENT:  Positive sore throat. Denies eye redness, eye pain, pressure behind the eyes, facial pain, nasal congestion, ear pain, ringing in the ears, wax buildup, runny nose or bloody nose. Respiratory: Positive cough. Denies difficulty breathing or shortness of breath.  Cardiovascular: Denies chest pain, chest tightness, palpitations or swelling in the hands or feet.   No other specific complaints in a complete review of systems (except as listed in HPI above).  Objective:   BP 128/74  Pulse 74  Temp(Src) 99 F (37.2 C) (Oral)  Wt 178 lb 8 oz (80.967 kg)  SpO2 96% Wt Readings from Last 3 Encounters:  04/19/13 178 lb 8 oz (80.967 kg)  11/01/12 173 lb 14.4 oz (78.881 kg)  08/25/12 172 lb 12 oz (78.359 kg)     General: Appears her stated age, well developed, well nourished in NAD. HEENT: Head: normal shape and size; Eyes: sclera white, no icterus, conjunctiva pink, PERRLA and EOMs intact; Ears: Tm's gray and intact, normal light reflex; Nose: mucosa pink and moist, septum midline; Throat/Mouth: + PND. Teeth present, mucosa erythematous and moist, no exudate noted, no lesions or ulcerations noted.  Neck: Mild cervical lymphadenopathy. Neck supple, trachea midline. No massses, lumps  or thyromegaly present.  Cardiovascular: Normal rate and rhythm. S1,S2 noted.  No murmur, rubs or gallops noted. No JVD or BLE edema. No carotid bruits noted. Pulmonary/Chest: Normal effort and positive vesicular breath sounds. No respiratory distress. No wheezes, rales or ronchi noted.      Assessment & Plan:   Acute Sinusitis:  Get some rest and drink plenty of water Do salt water gargles for the sore throat eRx for Augmentin BID x 10 days Continue your nasal rinse Tylenol for pain/fever  RTC as needed or if symptoms persist.

## 2013-04-24 ENCOUNTER — Telehealth: Payer: Self-pay

## 2013-04-24 ENCOUNTER — Encounter: Payer: Self-pay | Admitting: Family Medicine

## 2013-04-24 ENCOUNTER — Ambulatory Visit (INDEPENDENT_AMBULATORY_CARE_PROVIDER_SITE_OTHER): Payer: BC Managed Care – PPO | Admitting: Family Medicine

## 2013-04-24 VITALS — BP 106/70 | HR 68 | Temp 98.7°F | Ht 64.0 in | Wt 174.2 lb

## 2013-04-24 DIAGNOSIS — J45909 Unspecified asthma, uncomplicated: Secondary | ICD-10-CM | POA: Insufficient documentation

## 2013-04-24 NOTE — Telephone Encounter (Signed)
Noted  

## 2013-04-24 NOTE — Assessment & Plan Note (Addendum)
Sinus infection is improving on augmentin. She is on singulair and alvesco for RAD... Today's best Peak flow was 300. Recommended albuterol prn and temporary increase in alvesco.

## 2013-04-24 NOTE — Progress Notes (Signed)
  Subjective:    Patient ID: [name], female    DOB: [birth year], 62 y.o.   MRN: [id number]  HPI  62 year old female present after OV with NP on 11/13 dx with acute sinusitis.. Started on augmentin.  Today she reports that she  Has had improvement in nasal congestion and drainage... She feels like infection is now moving to chest. She is having wheeze and mild shortness of breath.  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 She is using alvesco 1 puff a day.  She has not used her inhaler..  She has been using nasal saline and essential oils vapor.  She is on singulair and allegra for allergies.    Review of Systems  Constitutional: Negative for fever and fatigue.       Resolution of fever  HENT: Negative for ear pain.   Eyes: Negative for pain.  Respiratory: Negative for chest tightness and shortness of breath.   Cardiovascular: Negative for chest pain, palpitations and leg swelling.  Gastrointestinal: Negative for abdominal pain.  Genitourinary: Negative for dysuria.       Objective:   Physical Exam  Constitutional: Vital signs are normal. She appears well-developed and well-nourished. She is cooperative.  Non-toxic appearance. She does not appear ill. No distress.  HENT:  Head: Normocephalic.  Right Ear: Hearing, tympanic membrane, external ear and ear canal normal. Tympanic membrane is not erythematous, not retracted and not bulging.  Left Ear: Hearing, tympanic membrane, external ear and ear canal normal. Tympanic membrane is not erythematous, not retracted and not bulging.  Nose: Mucosal edema and rhinorrhea present. Right sinus exhibits no maxillary sinus tenderness and no frontal sinus tenderness. Left sinus exhibits no maxillary sinus tenderness and no frontal sinus tenderness.  Mouth/Throat: Uvula is midline, oropharynx is clear and moist and mucous membranes are normal.  Eyes: Conjunctivae, EOM and lids are normal. Pupils are  equal, round, and reactive to light. Lids are everted and swept, no foreign bodies found.  Neck: Trachea normal and normal range of motion. Neck supple. Carotid bruit is not present. No mass and no thyromegaly present.  Cardiovascular: Normal rate, regular rhythm, S1 normal, S2 normal, normal heart sounds, intact distal pulses and normal pulses.  Exam reveals no gallop and no friction rub.   No murmur heard. Pulmonary/Chest: Effort normal and breath sounds normal. Not tachypneic. No respiratory distress. She has no decreased breath sounds. She has no wheezes. She has no rhonchi. She has no rales.  Neurological: She is alert.  Skin: Skin is warm, dry and intact. No rash noted.  Psychiatric: Her speech is normal and behavior is normal. Judgment normal. Her mood appears not anxious. Cognition and memory are normal. She does not exhibit a depressed mood.          Assessment & Plan:

## 2013-04-24 NOTE — Telephone Encounter (Signed)
Pt was seen 04/19/13 and still taking Augmentin; sinus is better but still sinus drip and prod cough with green yellow phlegm,some wheezing now,pt has been out of work since 04/18/13. Pt has no h/a,SOB,fever or sorethroat. Pt scheduled appt to see Dr Ermalene Searing today at 2:30 pm.

## 2013-04-24 NOTE — Progress Notes (Signed)
Pre-visit discussion using our clinic review tool. No additional management support is needed unless otherwise documented below in the visit note.  

## 2013-04-24 NOTE — Patient Instructions (Addendum)
Complete antibiotics. Use albuterol prn wheeze, SOB and coughing fits. Temporarily increase alvesco to 2 puffs daily.  If not improving as expected , call in 48 to 72 hours.

## 2013-04-25 ENCOUNTER — Encounter: Payer: Self-pay | Admitting: Family Medicine

## 2013-04-27 ENCOUNTER — Encounter: Payer: Self-pay | Admitting: Family Medicine

## 2013-05-01 ENCOUNTER — Telehealth: Payer: Self-pay | Admitting: *Deleted

## 2013-05-01 NOTE — Telephone Encounter (Signed)
Received fax from [facility] for Disability Claim from office visit 04/19/2013 to present.  Forms placed in Dr. Daphine Deutscher in box.

## 2013-05-02 ENCOUNTER — Ambulatory Visit (INDEPENDENT_AMBULATORY_CARE_PROVIDER_SITE_OTHER): Payer: BC Managed Care – PPO | Admitting: Family Medicine

## 2013-05-02 ENCOUNTER — Encounter: Payer: Self-pay | Admitting: Family Medicine

## 2013-05-02 VITALS — BP 120/80 | HR 72 | Temp 98.2°F | Ht 63.0 in | Wt 174.8 lb

## 2013-05-02 DIAGNOSIS — J209 Acute bronchitis, unspecified: Secondary | ICD-10-CM

## 2013-05-02 DIAGNOSIS — Z23 Encounter for immunization: Secondary | ICD-10-CM

## 2013-05-02 MED ORDER — LEVOFLOXACIN 500 MG PO TABS: 500.0000 mg | ORAL_TABLET | Freq: Every day | ORAL | Status: DC

## 2013-05-02 NOTE — Progress Notes (Signed)
Subjective:    Patient ID: [name], female    DOB: [birth year], 62 y.o.   MRN: [id number]  HPI Here for symptoms of sinus infection   tx with abx (augmentin) on 11/13 F/u with Dr Ermalene Searing for RAD- was improved   Initially got better  Now her symptoms are in chest Not wheezing Coughing - like a "whoop"- and then comes out as a blast of cough  Is bringing up some phlegm when she coughs -- yellow   Nose bleeds a bit  Still quite congested  A little headache but not a lot of facial pain  Lot of post nasal drip   Has had Td - does not think a Tdap New grandchild on the way   Patient Active Problem List   Diagnosis Date Noted  . Reactive airway disease 04/24/2013  . Paget's disease (intraepithelial adenocarcinoma) of vulva 05/01/2012  . COLONIC POLYPS, ADENOMATOUS, HX OF 12/11/2008  . IRON DEFICIENCY 11/11/2008  . MIGRAINE, COMMON 10/10/2008  . HYPERTENSION 10/10/2008  . ALLERGIC RHINITIS 10/10/2008  . GERD 10/10/2008  . PERIMENOPAUSAL SYNDROME 10/10/2008  . OSTEOARTHRITIS 10/10/2008  . DEGENERATIVE DISC DISEASE, CERVICAL SPINE 10/10/2008   Past Medical History  Diagnosis Date  . 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 S/P partial hysterectomy   . Hypertension   . Headache(784.0)     Migraines  . GERD (gastroesophageal reflux disease)   . GERD (gastroesophageal reflux disease)   . Arthritis   . Paget's disease of vulva   . Multiple allergies   . Vulvar mass 04/16/2011   Past Surgical History  Procedure Laterality Date  . Anterior and posterior repair  1989  . Simple vulvectomy  2011    right side  . Tonsilectomy, adenoidectomy, bilateral myringotomy and tubes    . Wisdom tooth extraction    . Hand surgery  2012    Left hand, ring finger cyst removal, trigger finger surg x 3 fingers  . Vaginal hysterectomy  1989    for prolapse  . Trigger finger surgery     History  Substance Use Topics  . Smoking status: Former Smoker    Quit date:  04/16/1991  . Smokeless tobacco: Never Used  . Alcohol Use: Yes     Comment: occasional wine   Family History  Problem Relation Age of Onset  . Cancer Maternal Grandmother   . Hypertension Father   . Heart disease Father   . Hypertension Mother   . Heart disease Mother   . Cancer Paternal Grandmother     breast cancer  . Colon cancer Paternal Uncle    Allergies  Allergen Reactions  . Ibuprofen Hypertension  . Naprosyn [Naproxen] Nausea Only  . Other     Allergic to toothpaste- makes mouth peel Hay fever/dust mites/trees "365 allergic"   . Azithromycin Itching and Rash  . Sulfa Antibiotics Hives and Rash   Current Outpatient Prescriptions on File Prior to Visit  Medication Sig Dispense Refill  . acetaminophen (TYLENOL) 650 MG CR tablet Take 650 mg by mouth every 4 (four) hours as needed. For pain/fever      . albuterol (PROVENTIL HFA;VENTOLIN HFA) 108 (90 BASE) MCG/ACT inhaler Inhale 2 puffs into the lungs every 6 (six) hours as needed. For wheezing      . ciclesonide (ALVESCO) 160 MCG/ACT inhaler Inhale 1 puff into the lungs every evening.       . fexofenadine (ALLEGRA) 180 MG tablet Take 180 mg  by mouth daily.      . fluticasone (FLONASE) 50 MCG/ACT nasal spray Place 2 sprays into the nose daily.       . lansoprazole (PREVACID) 30 MG capsule Take 1 capsule (30 mg total) by mouth daily.  90 capsule  3  . losartan-hydrochlorothiazide (HYZAAR) 100-25 MG per tablet TAKE 1 TABLET BY MOUTH DAILY.  90 tablet  0  . metoprolol tartrate (LOPRESSOR) 25 MG tablet Take 6.25 mg by mouth 2 (two) times daily.      . montelukast (SINGULAIR) 10 MG tablet Take 10 mg by mouth every morning.        No current facility-administered medications on file prior to visit.     Review of Systems    Review of Systems  Constitutional: Negative for fever, appetite change, and unexpected weight change.  ENt pos for cong and rhinorrhea , neg for sinus pain  Eyes: Negative for pain and visual  disturbance.  Respiratory: Negative for wheeze and shortness of breath.  pos for cough Cardiovascular: Negative for cp or palpitations    Gastrointestinal: Negative for nausea, diarrhea and constipation.  Genitourinary: Negative for urgency and frequency.  Skin: Negative for pallor or rash   Neurological: Negative for weakness, light-headedness, numbness and headaches.  Hematological: Negative for adenopathy. Does not bruise/bleed easily.  Psychiatric/Behavioral: Negative for dysphoric mood. The patient is not nervous/anxious.      Objective:   Physical Exam  Constitutional: She appears well-developed and well-nourished. No distress.  obese and well appearing   HENT:  Head: Normocephalic and atraumatic.  Right Ear: External ear normal.  Left Ear: External ear normal.  Mouth/Throat: Oropharynx is clear and moist. No oropharyngeal exudate.  Nares are injected and congested  No sinus tenderness Throat is clear Some clear rhinorrhea   Eyes: Conjunctivae and EOM are normal. Pupils are equal, round, and reactive to light. Right eye exhibits no discharge. Left eye exhibits no discharge. No scleral icterus.  Neck: Normal range of motion. Neck supple.  Cardiovascular: Normal rate and regular rhythm.   Pulmonary/Chest: Effort normal and breath sounds normal. No respiratory distress. She has no wheezes. She has no rales.  Few isolated rhonchi No wheeze or forced exp time   Lymphadenopathy:    She has no cervical adenopathy.  Neurological: She is alert.  Skin: Skin is warm and dry. No rash noted.  Psychiatric: She has a normal mood and affect.          Assessment & Plan:

## 2013-05-02 NOTE — Progress Notes (Signed)
Pre-visit discussion using our clinic review tool. No additional management support is needed unless otherwise documented below in the visit note.  

## 2013-05-02 NOTE — Assessment & Plan Note (Signed)
Reactive airways are improved Pt describes a "whoop" sound to cough- that is worrisome for pertussis- but cough sounds more hacky in the office and her exam is reassuring She is azithro and sulfa all Will cover with levaquin  She has new grandchild soon to be born and not imm for pertussis lately- will get Tdap today She will also get a flu shot as soon as she feels better  Update if not starting to improve in a week or if worsening   Disc symptomatic care - see instructions on AVS

## 2013-05-02 NOTE — Patient Instructions (Signed)
Drink lots of fluids and rest  If wheezing worsens-please update [place]  Take the levaquin as directed  Tdap vaccine today in light of new baby coming

## 2013-05-04 ENCOUNTER — Encounter: Payer: Self-pay | Admitting: Family Medicine

## 2013-05-04 ENCOUNTER — Ambulatory Visit (INDEPENDENT_AMBULATORY_CARE_PROVIDER_SITE_OTHER): Payer: BC Managed Care – PPO | Admitting: Family Medicine

## 2013-05-04 ENCOUNTER — Telehealth: Payer: Self-pay | Admitting: Family Medicine

## 2013-05-04 VITALS — BP 104/60 | HR 65 | Temp 98.2°F | Ht 63.0 in | Wt 172.8 lb

## 2013-05-04 DIAGNOSIS — J209 Acute bronchitis, unspecified: Secondary | ICD-10-CM

## 2013-05-04 DIAGNOSIS — J45909 Unspecified asthma, uncomplicated: Secondary | ICD-10-CM

## 2013-05-04 MED ORDER — PREDNISONE 10 MG PO TABS: ORAL_TABLET | ORAL | Status: DC

## 2013-05-04 MED ORDER — GUAIFENESIN-CODEINE 100-10 MG/5ML PO SYRP: 5.0000 mL | ORAL_SOLUTION | Freq: Four times a day (QID) | ORAL | Status: DC | PRN

## 2013-05-04 NOTE — Telephone Encounter (Signed)
Patient Information:  Caller Name: Olita  Phone: [phone]  Patient: [name], [name]  Gender: Female  DOB: 1950-12-08  Age: 62 Years  PCP: Kerby Nora (Family Practice)  Office Follow Up:  Does the office need to follow up with this patient?: No  Instructions For The Office: N/A  RN Note:  Patient states she was seen in the office for cough, nasal congestion. Patient states she was diagnosed with Bronchitis. Patient states she was given an immunization for TDap and also prescribed Levaquin on 05/02/13. Patient states she had a temperature of 100.4 orally 05/03/13 a.m. Patient states she is afebrile, 05/04/13. Patient states increased cough, expectorating yellow tinged sputum 05/03/13. Patient taking fluids well. Patient states intermittent wheezing noted 05/04/13. Patient states she has had coughing episodes causing gagging and "pins and needles" sensation in chest. Denies chest discomfort when not coughing. Care advice given per guidelines. Call back parameters reviewed. Patient verbalizes understanding.  Symptoms  Reason For Call & Symptoms: Increased productive cough  Reviewed Health History In EMR: Yes  Reviewed Medications In EMR: Yes  Reviewed Allergies In EMR: Yes  Reviewed Surgeries / Procedures: Yes  Date of Onset of Symptoms: 05/03/2013  Treatments Tried: Levaquin, Hydrocodone suspension  Treatments Tried Worked: No  Guideline(s) Used:  Cough  Disposition Per Guideline:   Go to Office Now  Reason For Disposition Reached:   Wheezing is present  Advice Given:  Coughing Spasms:  Drink warm fluids. Inhale warm mist (Reason: both relax the airway and loosen up the phlegm).  Suck on cough drops or hard candy to coat the irritated throat.  Avoid Tobacco Smoke:  Smoking or being exposed to smoke makes coughs much worse.  Call Back If:  Difficulty breathing  You become worse.  Patient Will Follow Care Advice:  YES  Appointment Scheduled:  05/04/2013  14:15:00 Appointment Scheduled Provider:  Roxy Manns [facility])

## 2013-05-04 NOTE — Progress Notes (Signed)
Subjective:    Patient ID: [name], female    DOB: [birth year], 62 y.o.   MRN: [id number]  HPI Is here for f/u of uri and worse  Now wheezing much more  Coughing all night - some phlegm and light yellow   Lots of nasal drainage too   levaquin - no side eff  Left over cough med from last year with codeine   She is using albuterol inhaler -- used it this am  Helped a little with her wheeze - ?   Patient Active Problem List   Diagnosis Date Noted  . Acute bronchitis 05/02/2013  . Reactive airway disease 04/24/2013  . Paget's disease (intraepithelial adenocarcinoma) of vulva 05/01/2012  . COLONIC POLYPS, ADENOMATOUS, HX OF 12/11/2008  . IRON DEFICIENCY 11/11/2008  . MIGRAINE, COMMON 10/10/2008  . HYPERTENSION 10/10/2008  . ALLERGIC RHINITIS 10/10/2008  . GERD 10/10/2008  . PERIMENOPAUSAL SYNDROME 10/10/2008  . OSTEOARTHRITIS 10/10/2008  . DEGENERATIVE DISC DISEASE, CERVICAL SPINE 10/10/2008   Past Medical History  Diagnosis Date  . 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 S/P partial hysterectomy   . Hypertension   . Headache(784.0)     Migraines  . GERD (gastroesophageal reflux disease)   . GERD (gastroesophageal reflux disease)   . Arthritis   . Paget's disease of vulva   . Multiple allergies   . Vulvar mass 04/16/2011   Past Surgical History  Procedure Laterality Date  . Anterior and posterior repair  1989  . Simple vulvectomy  2011    right side  . Tonsilectomy, adenoidectomy, bilateral myringotomy and tubes    . Wisdom tooth extraction    . Hand surgery  2012    Left hand, ring finger cyst removal, trigger finger surg x 3 fingers  . Vaginal hysterectomy  1989    for prolapse  . Trigger finger surgery     History  Substance Use Topics  . Smoking status: Former Smoker    Quit date: 04/16/1991  . Smokeless tobacco: Never Used  . Alcohol Use: Yes     Comment: occasional wine   Family History  Problem Relation Age of Onset  . Cancer  Maternal Grandmother   . Hypertension Father   . Heart disease Father   . Hypertension Mother   . Heart disease Mother   . Cancer Paternal Grandmother     breast cancer  . Colon cancer Paternal Uncle    Allergies  Allergen Reactions  . Ibuprofen Hypertension  . Naprosyn [Naproxen] Nausea Only  . Other     Allergic to toothpaste- makes mouth peel Hay fever/dust mites/trees "365 allergic"   . Azithromycin Itching and Rash  . Sulfa Antibiotics Hives and Rash   Current Outpatient Prescriptions on File Prior to Visit  Medication Sig Dispense Refill  . acetaminophen (TYLENOL) 650 MG CR tablet Take 650 mg by mouth every 4 (four) hours as needed. For pain/fever      . albuterol (PROVENTIL HFA;VENTOLIN HFA) 108 (90 BASE) MCG/ACT inhaler Inhale 2 puffs into the lungs every 6 (six) hours as needed. For wheezing      . ciclesonide (ALVESCO) 160 MCG/ACT inhaler Inhale 1 puff into the lungs every evening.       . fexofenadine (ALLEGRA) 180 MG tablet Take 180 mg by mouth daily.      . fluticasone (FLONASE) 50 MCG/ACT nasal spray Place 2 sprays into the nose daily.       . lansoprazole (PREVACID)  30 MG capsule Take 1 capsule (30 mg total) by mouth daily.  90 capsule  3  . levofloxacin (LEVAQUIN) 500 MG tablet Take 1 tablet (500 mg total) by mouth daily.  7 tablet  0  . losartan-hydrochlorothiazide (HYZAAR) 100-25 MG per tablet TAKE 1 TABLET BY MOUTH DAILY.  90 tablet  0  . metoprolol tartrate (LOPRESSOR) 25 MG tablet Take 6.25 mg by mouth 2 (two) times daily.      . montelukast (SINGULAIR) 10 MG tablet Take 10 mg by mouth every morning.        No current facility-administered medications on file prior to visit.      Review of Systems     Objective:   Physical Exam  Constitutional: She appears well-developed and well-nourished. No distress.  obese and well appearing   HENT:  Head: Normocephalic and atraumatic.  Right Ear: External ear normal.  Left Ear: External ear normal.    Mouth/Throat: Oropharynx is clear and moist.  Nares are injected and congested  No sinus tenderness  Throat clear   Eyes: Conjunctivae and EOM are normal. Pupils are equal, round, and reactive to light. Right eye exhibits no discharge. Left eye exhibits no discharge.  Neck: Normal range of motion. Neck supple.  Cardiovascular: Normal rate and regular rhythm.   Pulmonary/Chest: Effort normal. No respiratory distress. She has wheezes. She has no rales. She exhibits no tenderness.  Few isolated rhonchi No rales  Exp wheeze throughout on forced exp Nl exp time  Upper airway sounds noted   Lymphadenopathy:    She has no cervical adenopathy.  Neurological: She is alert.  Skin: Skin is warm and dry. No rash noted.  Psychiatric: She has a normal mood and affect.          Assessment & Plan:

## 2013-05-04 NOTE — Patient Instructions (Signed)
Drink fluids and rest  Use your albuterol inhaler up to every 4 hours as needed  Take the prednisone taper as directed  Finish levaquin  Use cough medicine with caution because it will sedate Update if not starting to improve in a week or if worsening

## 2013-05-04 NOTE — Progress Notes (Signed)
Pre-visit discussion using our clinic review tool. No additional management support is needed unless otherwise documented below in the visit note.  

## 2013-05-06 NOTE — Assessment & Plan Note (Signed)
With acute bronchitis and cough  Pred taper 30 mg and disc side eff  Robitussin ac Albuterol prn  Update if not starting to improve in a week or if worsening

## 2013-05-06 NOTE — Assessment & Plan Note (Signed)
Now having more RAD  pred taper and albuterol  Also cough med with codeine  Update

## 2013-05-07 NOTE — Telephone Encounter (Signed)
Pt was seen 11/28

## 2013-05-08 ENCOUNTER — Encounter: Payer: Self-pay | Admitting: Family Medicine

## 2013-05-08 ENCOUNTER — Telehealth: Payer: Self-pay

## 2013-05-08 NOTE — Telephone Encounter (Signed)
Patient notified Dr. Ermalene Searing will complete [employer] paperwork today before she leaves the office.

## 2013-05-08 NOTE — Telephone Encounter (Signed)
Faxed last 4 OV notes and letter as requested by Dr. Ermalene Searing to [phone]

## 2013-05-08 NOTE — Telephone Encounter (Signed)
Please fax these papers back tonight.

## 2013-05-08 NOTE — Telephone Encounter (Signed)
Pt wants to know if [employer] information requested could be faxed to [employer] before deadline on 05/09/13.pt request cb when [employer] papers completed and faxed. Pt spoke with [employer] and was assured [employer] papers were received and will be faxed back.Please advise.

## 2013-05-08 NOTE — Telephone Encounter (Signed)
Will complete today before leaving the office.

## 2013-05-09 NOTE — Telephone Encounter (Signed)
Forms completed by Dr. Ermalene Searing and faxed back to [facility] by [place] on 05/08/2013.

## 2013-05-11 ENCOUNTER — Telehealth: Payer: Self-pay | Admitting: Family Medicine

## 2013-05-11 NOTE — Telephone Encounter (Signed)
Caller: Sterling/Patient; Phone: [phone]; Reason for Call: Patient would like to update Dr.  Ermalene Searing and Dr.  Milinda Antis and Nicki Reaper NP- She states overall she is much better with sinus/Bronchitis issues.  +productive cough clear and hoarsness.  She was seen by Allergist yesterday and they put her Alvesto to BID but it causes her to be hoarse, she is being referred to an ENT to evaluate the Hoarsness.  She wanted to update them status.

## 2013-05-14 NOTE — Telephone Encounter (Signed)
Thanks for the update- glad she is doing better !

## 2013-05-24 ENCOUNTER — Other Ambulatory Visit: Payer: Self-pay

## 2013-05-24 NOTE — Telephone Encounter (Signed)
[facility] script left v/m requesting refill losartan HCTZ and metoprolol. Pt last seen 05/04/13 for sick visit. Does pt need to schedule appt as noted on last refill of losartan hctz.?

## 2013-05-24 NOTE — Telephone Encounter (Signed)
Yes pt need appt for yearly eval of chronic issues in next 3-4 months. OK to refill until appt made

## 2013-05-25 NOTE — Telephone Encounter (Signed)
Left message for patient to return my call.

## 2013-05-27 MED ORDER — METOPROLOL TARTRATE 25 MG PO TABS: 6.2500 mg | ORAL_TABLET | Freq: Two times a day (BID) | ORAL | Status: DC

## 2013-05-27 MED ORDER — LOSARTAN POTASSIUM-HCTZ 100-25 MG PO TABS: ORAL_TABLET | ORAL | Status: DC

## 2013-07-02 ENCOUNTER — Encounter: Payer: Self-pay | Admitting: Gynecology

## 2013-07-02 ENCOUNTER — Other Ambulatory Visit (HOSPITAL_COMMUNITY)
Admission: RE | Admit: 2013-07-02 | Discharge: 2013-07-02 | Disposition: A | Discharge status: Home / Self Care | Payer: BC Managed Care – PPO | Source: Ambulatory Visit | Attending: Gynecology | Admitting: Gynecology

## 2013-07-02 ENCOUNTER — Ambulatory Visit: Payer: BC Managed Care – PPO | Attending: Gynecology | Admitting: Gynecology

## 2013-07-02 VITALS — BP 130/84 | HR 70 | Temp 98.6°F | Resp 18 | Ht 64.37 in | Wt 177.4 lb

## 2013-07-02 DIAGNOSIS — Z90711 Acquired absence of uterus with remaining cervical stump: Secondary | ICD-10-CM | POA: Insufficient documentation

## 2013-07-02 DIAGNOSIS — Z79899 Other long term (current) drug therapy: Secondary | ICD-10-CM | POA: Insufficient documentation

## 2013-07-02 DIAGNOSIS — N952 Postmenopausal atrophic vaginitis: Secondary | ICD-10-CM | POA: Insufficient documentation

## 2013-07-02 DIAGNOSIS — Z8544 Personal history of malignant neoplasm of other female genital organs: Secondary | ICD-10-CM | POA: Insufficient documentation

## 2013-07-02 DIAGNOSIS — K219 Gastro-esophageal reflux disease without esophagitis: Secondary | ICD-10-CM | POA: Insufficient documentation

## 2013-07-02 DIAGNOSIS — Z01419 Encounter for gynecological examination (general) (routine) without abnormal findings: Secondary | ICD-10-CM | POA: Insufficient documentation

## 2013-07-02 DIAGNOSIS — Z09 Encounter for follow-up examination after completed treatment for conditions other than malignant neoplasm: Secondary | ICD-10-CM | POA: Insufficient documentation

## 2013-07-02 DIAGNOSIS — I10 Essential (primary) hypertension: Secondary | ICD-10-CM | POA: Insufficient documentation

## 2013-07-02 DIAGNOSIS — Z87891 Personal history of nicotine dependence: Secondary | ICD-10-CM | POA: Insufficient documentation

## 2013-07-02 DIAGNOSIS — C519 Malignant neoplasm of vulva, unspecified: Secondary | ICD-10-CM

## 2013-07-02 DIAGNOSIS — C4499 Other specified malignant neoplasm of skin, unspecified: Secondary | ICD-10-CM

## 2013-07-02 DIAGNOSIS — N895 Stricture and atresia of vagina: Secondary | ICD-10-CM | POA: Insufficient documentation

## 2013-07-02 NOTE — Patient Instructions (Signed)
We will contact you with the results of your pap smear from today.  Plan to follow up in six months or sooner if needed.  Please call in May or June to schedule an appt in July with Dr. Fermin Schwab.

## 2013-07-02 NOTE — Progress Notes (Signed)
Consult Note: Gyn-Onc   [name] 63 y.o. female  Chief Complaint  Patient presents with  . Paget's Disease    Follow up    Assessment : Paget's disease of the vulva currently in remission. 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  Plan: The patient return to see me in 6 months Pap smears obtained today. We discussed possible strategies for improved sexual function but at this point the patient does not wish to pursue any of these options.  Interval History: Patient returns today as previously scheduled. Since her last visit she's done well. She denies any vulvar symptoms or any vulvar lesions.  HPI:The patient initially underwent surgery for Paget's disease of the vulva in October 2011. There was focal involvement of the medial margin. The entire lesion is approximately 5 cm in diameter.   The patient previously had a supracervical hysterectomy but has her ovaries and cervix in situ.  She notes that she had some repair done at the time of her hysterectomy and at her introitus is tight and she has difficulty having intercourse and also has tearing. On the other hand her husband has had prostate cancer surgery making intercourse difficult as well.     Review of Systems:10 point review of systems is negative except as noted in interval history.   Vitals: Blood pressure 130/84, pulse 70, temperature 98.6 F (37 C), temperature source Oral, resp. rate 18, height 5' 4.37" (1.635 m), weight 177 lb 6.4 oz (80.468 kg), SpO2 97.00%.  Physical Exam: General : The patient is a healthy woman in no acute distress.  HEENT: normocephalic, extraoccular movements normal; neck is supple without thyromegally  Lynphnodes: Supraclavicular and inguinal nodes not enlarged  Abdomen: Soft, non-tender, no ascites, no organomegally, no masses, no hernias  Pelvic:  EGBUS: Normal female status post partial vulvectomy with flattening of the right labia minora. No  lesions are noted Vagina: Atrophic, no lesions  Urethra and Bladder: Normal, non-tender  Cervix: Atrophic Uterus: Surgically absent  Bi-manual examination: Non-tender; no adenxal masses or nodularity  Rectal: normal sphincter tone, no masses, no blood  Lower extremities: No edema or varicosities. Normal range of motion      Allergies  Allergen Reactions  . Ibuprofen Hypertension  . Naprosyn [Naproxen] Nausea Only  . Other     Allergic to toothpaste- makes mouth peel Hay fever/dust mites/trees "365 allergic"   . Azithromycin Itching and Rash  . Sulfa Antibiotics Hives and Rash    Past Medical History  Diagnosis Date  . 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 S/P partial hysterectomy   . Hypertension   . Headache(784.0)     Migraines  . GERD (gastroesophageal reflux disease)   . GERD (gastroesophageal reflux disease)   . Arthritis   . Paget's disease of vulva   . Multiple allergies   . Vulvar mass 04/16/2011    Past Surgical History  Procedure Laterality Date  . Anterior and posterior repair  1989  . Simple vulvectomy  2011    right side  . Tonsilectomy, adenoidectomy, bilateral myringotomy and tubes    . Wisdom tooth extraction    . Hand surgery  2012    Left hand, ring finger cyst removal, trigger finger surg x 3 fingers  . Vaginal hysterectomy  1989    for prolapse  . Trigger finger surgery      Current Outpatient Prescriptions  Medication Sig Dispense Refill  . ciclesonide (ALVESCO)  160 MCG/ACT inhaler Inhale 1 puff into the lungs every evening.       . fluticasone (FLONASE) 50 MCG/ACT nasal spray Place 2 sprays into the nose daily.       . lansoprazole (PREVACID) 30 MG capsule Take 60 mg by mouth daily.      Marland Kitchen losartan-hydrochlorothiazide (HYZAAR) 100-25 MG per tablet TAKE 1 TABLET BY MOUTH DAILY.  90 tablet  0  . metoprolol tartrate (LOPRESSOR) 25 MG tablet Take 0.5 tablets (12.5 mg total) by mouth 2 (two) times daily.  90 tablet  0  .  montelukast (SINGULAIR) 10 MG tablet Take 10 mg by mouth every morning.       Marland Kitchen acetaminophen (TYLENOL) 650 MG CR tablet Take 650 mg by mouth every 4 (four) hours as needed. For pain/fever      . albuterol (PROVENTIL HFA;VENTOLIN HFA) 108 (90 BASE) MCG/ACT inhaler Inhale 2 puffs into the lungs every 6 (six) hours as needed. For wheezing      . guaiFENesin-codeine (ROBITUSSIN AC) 100-10 MG/5ML syrup Take 5 mLs by mouth 4 (four) times daily as needed for cough.  120 mL  0   No current facility-administered medications for this visit.    History   Social History  . Marital Status: Married    Spouse Name: N/A    Number of Children: N/A  . Years of Education: N/A   Occupational History  . Not on file.   Social History Main Topics  . Smoking status: Former Smoker    Quit date: 04/16/1991  . Smokeless tobacco: Never Used  . Alcohol Use: Yes     Comment: occasional wine  . Drug Use: No  . Sexual Activity: Not on file   Other Topics Concern  . Not on file   Social History Narrative  . No narrative on file    Family History  Problem Relation Age of Onset  . Cancer Maternal Grandmother   . Hypertension Father   . Heart disease Father   . Hypertension Mother   . Heart disease Mother   . Cancer Paternal Grandmother     breast cancer  . Colon cancer Paternal Pasty Spillers, MD 07/02/2013, 8:44 AM

## 2013-07-06 ENCOUNTER — Telehealth: Payer: Self-pay | Admitting: *Deleted

## 2013-07-06 NOTE — Telephone Encounter (Signed)
Message copied by GARNER, Aletha Halim on Fri Jul 06, 2013  2:01 PM ------      Message from: [name] D      Created: Fri Jul 06, 2013  1:02 PM       No signs of cancer.  Showing vaginal atrophy or dryness.  If she developed symptoms (itching, soreness, pain with intercourse) or would like to seek treatment, we can arrange that for her.        ----- Message -----         From: [facility]         Sent: 07/05/2013   3:44 PM           To: Dorothyann Gibbs, NP                   ------

## 2013-07-06 NOTE — Telephone Encounter (Signed)
Per NP, notified pt lmovm of results with instructions to call with any symptoms or if she wishes to seek treatment.Requested pt to call back with any concerns.

## 2013-08-08 ENCOUNTER — Other Ambulatory Visit: Payer: Self-pay | Admitting: Family Medicine

## 2013-09-10 ENCOUNTER — Ambulatory Visit (INDEPENDENT_AMBULATORY_CARE_PROVIDER_SITE_OTHER): Payer: BC Managed Care – PPO | Admitting: Family Medicine

## 2013-09-10 ENCOUNTER — Encounter: Payer: Self-pay | Admitting: Family Medicine

## 2013-09-10 VITALS — BP 124/78 | HR 52 | Temp 98.0°F | Ht 63.0 in | Wt 176.2 lb

## 2013-09-10 DIAGNOSIS — J0100 Acute maxillary sinusitis, unspecified: Secondary | ICD-10-CM

## 2013-09-10 MED ORDER — AMOXICILLIN-POT CLAVULANATE 875-125 MG PO TABS: 1.0000 | ORAL_TABLET | Freq: Two times a day (BID) | ORAL | Status: DC

## 2013-09-10 MED ORDER — LOSARTAN POTASSIUM-HCTZ 100-25 MG PO TABS: ORAL_TABLET | ORAL | Status: DC

## 2013-09-10 MED ORDER — MONTELUKAST SODIUM 10 MG PO TABS: 10.0000 mg | ORAL_TABLET | Freq: Every morning | ORAL | Status: DC

## 2013-09-10 MED ORDER — FLUCONAZOLE 150 MG PO TABS: 150.0000 mg | ORAL_TABLET | Freq: Once | ORAL | Status: DC

## 2013-09-10 NOTE — Progress Notes (Signed)
Pre visit review using our clinic review tool, if applicable. No additional management support is needed unless otherwise documented below in the visit note. 

## 2013-09-10 NOTE — Progress Notes (Signed)
Patient Name: [name] Date of Birth: 06/14/1950 Medical Record Number: [id number]  History of Present Illness:  This 63 y.o. female patient presents with runny nose, sneezing, cough, sore throat, malaise and minimal / low-grade fever for 2 week. Now the primary complaint has become sinus pressure and pain behind the eyes and in the upper, anterior face.   S/p augmentin in 07/2013, was having a bloody nose and then started to have pain in the maxillary region. 14 days of Augmentin. Felt better after this.  Has a big yard, and cannot stay out there.  Not in chest at all.  Blood tibged nose  The patent denies sore throat as the primary complaint. Denies sthortness of breath/wheezing, high fever, chest pain, significant myalgia, otalgia, abdominal pain, changes in bowel or bladder.  PMH, PHS, Allergies, Problem List, Medications, Family History, and Social History have all been reviewed.  Patient Active Problem List   Diagnosis Date Noted  . Acute bronchitis 05/02/2013  . Reactive airway disease 04/24/2013  . Paget's disease (intraepithelial adenocarcinoma) of vulva 05/01/2012  . COLONIC POLYPS, ADENOMATOUS, HX OF 12/11/2008  . IRON DEFICIENCY 11/11/2008  . MIGRAINE, COMMON 10/10/2008  . HYPERTENSION 10/10/2008  . ALLERGIC RHINITIS 10/10/2008  . GERD 10/10/2008  . PERIMENOPAUSAL SYNDROME 10/10/2008  . OSTEOARTHRITIS 10/10/2008  . [place] DISEASE, CERVICAL SPINE 10/10/2008    Past Medical History  Diagnosis Date  . 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 S/P partial hysterectomy   . Hypertension   . Headache(784.0)     Migraines  . GERD (gastroesophageal reflux disease)   . GERD (gastroesophageal reflux disease)   . Arthritis   . Paget's disease of vulva   . Multiple allergies   . Vulvar mass 04/16/2011    Past Surgical History  Procedure Laterality Date  . Anterior and posterior repair  1989  . Simple vulvectomy  2011    right side  .  Tonsilectomy, adenoidectomy, bilateral myringotomy and tubes    . Wisdom tooth extraction    . Hand surgery  2012    Left hand, ring finger cyst removal, trigger finger surg x 3 fingers  . Vaginal hysterectomy  1989    for prolapse  . Trigger finger surgery      History   Social History  . Marital Status: Married    Spouse Name: N/A    Number of Children: N/A  . Years of Education: N/A   Occupational History  . Not on file.   Social History Main Topics  . Smoking status: Former Smoker    Quit date: 04/16/1991  . Smokeless tobacco: Never Used  . Alcohol Use: Yes     Comment: occasional wine  . Drug Use: No  . Sexual Activity: Not on file   Other Topics Concern  . Not on file   Social History Narrative  . No narrative on file    Family History  Problem Relation Age of Onset  . Cancer Maternal Grandmother   . Hypertension Father   . Heart disease Father   . Hypertension Mother   . Heart disease Mother   . Cancer Paternal Grandmother     breast cancer  . Colon cancer Paternal Uncle     Allergies  Allergen Reactions  . Ibuprofen Hypertension  . Naprosyn [Naproxen] Nausea Only  . Other     Allergic to toothpaste- makes mouth peel Hay fever/dust mites/trees "365 allergic"   . Azithromycin Itching and  Rash  . Sulfa Antibiotics Hives and Rash    Medication list reviewed and updated in full in [place].  Review of Systems: as above, eating and drinking - tolerating PO. Urinating normally. No excessive vomitting or diarrhea. O/w as above.  Physical Exam:  Filed Vitals:   09/10/13 1029  BP: 124/78  Pulse: 52  Temp: 98 F (36.7 C)  TempSrc: Oral  Height: 5\' 3"  (1.6 m)  Weight: 176 lb 4 oz (79.946 kg)    GEN: WDWN, Non-toxic, Atraumatic, normocephalic. A and O x 3. HEENT: Oropharynx clear without exudate, MMM, no significant LAD, mild rhinnorhea Sinuses: Right Frontal, ethmoid, and maxillary: Tender max Left Frontal, Ethmoid, and maxillary:  Tender max Ears: TM clear, COL visualized with good landmarks CV: RRR, no m/g/r. Pulm: CTA B, no wheezes, rhonchi, or crackles, normal respiratory effort. EXT: no c/c/e Psych: well oriented, neither depressed nor anxious in appearance  Assessment and Plan: Acute Sinusitis  Acute sinusitis: ABX as below.   Reviewed symptomatic care as well as ABX in this case.    New Prescriptions   AMOXICILLIN-CLAVULANATE (AUGMENTIN) 875-125 MG PER TABLET    Take 1 tablet by mouth 2 (two) times daily.   FLUCONAZOLE (DIFLUCAN) 150 MG TABLET    Take 1 tablet (150 mg total) by mouth once.    Patient Instructions Reviewed: SINUSITIS Sinuses are cavities in facial skeleton that drain to nose. Impaired drainage and obstruction of sinus passages main cause.  Treatment: 1. Take all Antibiotics 2. Open nasal and sinus canals: Oral decongestant: Sudafed. (CAUTION IF HIGH BLOOD PRESSURE) 3. Steam inhalation 4. Humidifier in room 5. Frequent nasal saline irrigation 6. Moist heat compresses to face 7. Tylenol or Ibuprofen for pain and fever, follow directions on bottle.   Signed,  Maud Deed. Joleena Weisenburger, MD, [place] [facility] [place] [address] Phone: [phone] Fax: [phone]   Patient's Medications  New Prescriptions   AMOXICILLIN-CLAVULANATE (AUGMENTIN) 875-125 MG PER TABLET    Take 1 tablet by mouth 2 (two) times daily.   FLUCONAZOLE (DIFLUCAN) 150 MG TABLET    Take 1 tablet (150 mg total) by mouth once.  Previous Medications   ACETAMINOPHEN (TYLENOL) 650 MG CR TABLET    Take 650 mg by mouth every 4 (four) hours as needed. For pain/fever   ALBUTEROL (PROVENTIL HFA;VENTOLIN HFA) 108 (90 BASE) MCG/ACT INHALER    Inhale 2 puffs into the lungs every 6 (six) hours as needed. For wheezing   CICLESONIDE (ALVESCO) 160 MCG/ACT INHALER    Inhale 1 puff into the lungs every evening.    FEXOFENADINE (ALLEGRA) 180 MG TABLET    Take 180 mg by mouth  daily.   FLUTICASONE (FLONASE) 50 MCG/ACT NASAL SPRAY    Place 2 sprays into the nose daily.    LANSOPRAZOLE (PREVACID) 30 MG CAPSULE    Take 60 mg by mouth 2 (two) times daily.    METOPROLOL TARTRATE (LOPRESSOR) 25 MG TABLET    Take 0.5 tablets (12.5 mg total) by mouth 2 (two) times daily.  Modified Medications   Modified Medication Previous Medication   LOSARTAN-HYDROCHLOROTHIAZIDE (HYZAAR) 100-25 MG PER TABLET losartan-hydrochlorothiazide (HYZAAR) 100-25 MG per tablet      TAKE 1 TABLET BY MOUTH DAILY.    TAKE 1 TABLET BY MOUTH DAILY.   MONTELUKAST (SINGULAIR) 10 MG TABLET montelukast (SINGULAIR) 10 MG tablet      Take 1 tablet (10 mg total) by mouth every morning.  Take 10 mg by mouth every morning.   Discontinued Medications   GUAIFENESIN-CODEINE (ROBITUSSIN AC) 100-10 MG/5ML SYRUP    Take 5 mLs by mouth 4 (four) times daily as needed for cough.

## 2013-11-22 ENCOUNTER — Other Ambulatory Visit: Payer: Self-pay | Admitting: Family Medicine

## 2013-12-06 ENCOUNTER — Ambulatory Visit (INDEPENDENT_AMBULATORY_CARE_PROVIDER_SITE_OTHER): Payer: BC Managed Care – PPO | Admitting: Family Medicine

## 2013-12-06 ENCOUNTER — Encounter: Payer: Self-pay | Admitting: Family Medicine

## 2013-12-06 VITALS — BP 132/80 | HR 53 | Temp 98.4°F | Ht 64.0 in | Wt 170.5 lb

## 2013-12-06 DIAGNOSIS — J309 Allergic rhinitis, unspecified: Secondary | ICD-10-CM

## 2013-12-06 DIAGNOSIS — I10 Essential (primary) hypertension: Secondary | ICD-10-CM

## 2013-12-06 DIAGNOSIS — J4530 Mild persistent asthma, uncomplicated: Secondary | ICD-10-CM

## 2013-12-06 DIAGNOSIS — J45909 Unspecified asthma, uncomplicated: Secondary | ICD-10-CM

## 2013-12-06 LAB — COMPREHENSIVE METABOLIC PANEL
ALBUMIN: 4.3 g/dL (ref 3.5–5.2)
ALT: 17 U/L (ref 0–35)
AST: 23 U/L (ref 0–37)
Alkaline Phosphatase: 53 U/L (ref 39–117)
BUN: 13 mg/dL (ref 6–23)
CALCIUM: 9.7 mg/dL (ref 8.4–10.5)
CHLORIDE: 100 meq/L (ref 96–112)
CO2: 30 meq/L (ref 19–32)
CREATININE: 0.8 mg/dL (ref 0.4–1.2)
GFR: 73.29 mL/min (ref >60.00)
Glucose, Bld: 103 mg/dL — ABNORMAL HIGH (ref 70–99)
POTASSIUM: 3.7 meq/L (ref 3.5–5.1)
Sodium: 137 mEq/L (ref 135–145)
Total Bilirubin: 0.8 mg/dL (ref 0.2–1.2)
Total Protein: 7.6 g/dL (ref 6.0–8.3)

## 2013-12-06 LAB — LIPID PANEL
Cholesterol: 219 mg/dL — ABNORMAL HIGH (ref 0–200)
HDL: 73.4 mg/dL (ref >39.00)
LDL Cholesterol: 125 mg/dL — ABNORMAL HIGH (ref 0–99)
NONHDL: 145.6
Total CHOL/HDL Ratio: 3
Triglycerides: 102 mg/dL (ref 0.0–149.0)
VLDL: 20.4 mg/dL (ref 0.0–40.0)

## 2013-12-06 NOTE — Assessment & Plan Note (Signed)
Well controlled. Continue current medication.  

## 2013-12-06 NOTE — Progress Notes (Signed)
63 year old female presents for wellness exam.   Saw GYN (Dr. Aldean Ast) for Paget's disease of vulva, s/p excision in 2011... No recurrence.   Hypertension: Well controlled on losartan and lopressor.  BP Readings from Last 3 Encounters:  12/06/13 132/80  09/10/13 124/78  07/02/13 130/84  Using medication without problems or lightheadedness: None  Chest pain with exertion: None  Edema:None  Short of breath: None Average home BPs: well controlled Other issues:  She is working on exercise and weight loss.   Allergic rhinitits: Seeing allergist for multiple allergies: fluticasone nasal, alvesco, claritin.  Has helped with hoarseness, and SOB.    She is doing better on essential oils.. No ST, no sinus pressure.  Due for routine labs. She has been eating healthier.  She is walking weekly. Wt Readings from Last 3 Encounters:  12/06/13 170 lb 8 oz (77.338 kg)  09/10/13 176 lb 4 oz (79.946 kg)  07/02/13 177 lb 6.4 oz (80.468 kg)     Review of Systems  Constitutional: Negative for fever, fatigue and unexpected weight change.  HENT: Negative for ear pain, congestion, sore throat, sneezing, trouble swallowing and sinus pressure.  Eyes: Negative for pain and itching.  Respiratory: No SOB, recennt sinus infecitons. Cardiovascular: Negative for chest pain, palpitations and leg swelling.  Gastrointestinal: Negative for nausea, abdominal pain, diarrhea, constipation and blood in stool.  Genitourinary: Negative for dysuria, hematuria, vaginal discharge, difficulty urinating and menstrual problem.  Musculoskeletal: Skin: Negative for rash.  Neurological: Negative for syncope, weakness, light-headedness, numbness and headaches.  Psychiatric/Behavioral: Negative for confusion and dysphoric mood. The patient is not nervous/anxious.  Objective:   Physical Exam  Constitutional: Vital signs are normal. She appears well-developed and well-nourished. She is cooperative. Non-toxic appearance.  She does not appear ill. No distress.  HENT:  Head: Normocephalic.  Right Ear: Hearing, tympanic membrane, external ear and ear canal normal.  Left Ear: Hearing, tympanic membrane, external ear and ear canal normal.  Nose: Nose normal.  Eyes: Conjunctivae, EOM and lids are normal. Pupils are equal, round, and reactive to light. No foreign bodies found.  Neck: Trachea normal and normal range of motion. Neck supple. Carotid bruit is not present. No mass and no thyromegaly present.  Cardiovascular: Normal rate, regular rhythm, S1 normal, S2 normal, normal heart sounds and intact distal pulses. Exam reveals no gallop.  No murmur heard.  Pulmonary/Chest: Effort normal and breath sounds normal. No respiratory distress. She has no wheezes. She has no rhonchi. She has no rales.  Abdominal: Soft. Normal appearance and bowel sounds are normal. She exhibits no distension, no fluid wave, no abdominal bruit and no mass. There is no hepatosplenomegaly. There is no tenderness. There is no rebound, no guarding and no CVA tenderness. No hernia.  Genitourinary: No breast swelling, tenderness, discharge or bleeding. Pelvic exam was performed with patient prone.  Lymphadenopathy:  She has no cervical adenopathy.  She has no axillary adenopathy.  Neurological: She is alert. She has normal strength. No cranial nerve deficit or sensory deficit.  Skin: Skin is warm, dry and intact. No rash noted.  Psychiatric: Her speech is normal and behavior is normal. Judgment normal. Her mood appears not anxious. Cognition and memory are normal. She does not exhibit a depressed mood.  Assessment & Plan:   CPX: The patient's preventative maintenance and recommended screening tests for an annual wellness exam were reviewed in full today.  Brought up to date unless services declined.  Counselled on the importance of diet, exercise,  and its role in overall health and mortality.  The patient's FH and SH was reviewed, including their  home life, tobacco status, and drug and alcohol status.   Vaccines: Uptodate with Tdap, flu refused, will look into shingles. PAP/DVE: Followed by GYN. Mammo: 02/2013 nml  Colon: nml 2011 Dr. Carlean Purl, repeat in 5 years.

## 2013-12-06 NOTE — Progress Notes (Signed)
Pre visit review using our clinic review tool, if applicable. No additional management support is needed unless otherwise documented below in the visit note. 

## 2013-12-06 NOTE — Patient Instructions (Addendum)
Stop at lab on way out. Look into zostavax coverage.  Work on starting exercise 3-5 times a week.  Vaccines: Uptodate with Tdap, will look into shingels. PAP/DVE: Followed by GYN. Mammo: 02/2013 nml  Colon: nml 2011 Dr. Carlean Purl, repeat in 5 years.

## 2013-12-10 ENCOUNTER — Telehealth: Payer: Self-pay | Admitting: Family Medicine

## 2013-12-10 NOTE — Telephone Encounter (Signed)
Relevant patient education assigned to patient using Emmi. ° °

## 2013-12-21 ENCOUNTER — Ambulatory Visit: Payer: BC Managed Care – PPO | Admitting: Gynecology

## 2014-01-10 NOTE — Assessment & Plan Note (Signed)
Improved on current regimen 

## 2014-01-10 NOTE — Assessment & Plan Note (Signed)
Followed by allergist. Essential oils have helped dramatically.

## 2014-01-18 ENCOUNTER — Ambulatory Visit: Payer: BC Managed Care – PPO | Attending: Gynecology | Admitting: Gynecology

## 2014-01-18 ENCOUNTER — Encounter: Payer: Self-pay | Admitting: Gynecology

## 2014-01-18 VITALS — BP 119/59 | HR 58 | Temp 98.9°F | Resp 20 | Ht 64.0 in | Wt 166.7 lb

## 2014-01-18 DIAGNOSIS — Z90710 Acquired absence of both cervix and uterus: Secondary | ICD-10-CM | POA: Diagnosis not present

## 2014-01-18 DIAGNOSIS — Z87891 Personal history of nicotine dependence: Secondary | ICD-10-CM | POA: Diagnosis not present

## 2014-01-18 DIAGNOSIS — K219 Gastro-esophageal reflux disease without esophagitis: Secondary | ICD-10-CM | POA: Insufficient documentation

## 2014-01-18 DIAGNOSIS — I10 Essential (primary) hypertension: Secondary | ICD-10-CM | POA: Diagnosis not present

## 2014-01-18 DIAGNOSIS — C519 Malignant neoplasm of vulva, unspecified: Secondary | ICD-10-CM | POA: Diagnosis present

## 2014-01-18 DIAGNOSIS — Z882 Allergy status to sulfonamides status: Secondary | ICD-10-CM | POA: Diagnosis not present

## 2014-01-18 DIAGNOSIS — Z881 Allergy status to other antibiotic agents status: Secondary | ICD-10-CM | POA: Insufficient documentation

## 2014-01-18 DIAGNOSIS — Z888 Allergy status to other drugs, medicaments and biological substances status: Secondary | ICD-10-CM | POA: Insufficient documentation

## 2014-01-18 DIAGNOSIS — Z886 Allergy status to analgesic agent status: Secondary | ICD-10-CM | POA: Diagnosis not present

## 2014-01-18 DIAGNOSIS — Z79899 Other long term (current) drug therapy: Secondary | ICD-10-CM | POA: Insufficient documentation

## 2014-01-18 DIAGNOSIS — C4499 Other specified malignant neoplasm of skin, unspecified: Secondary | ICD-10-CM

## 2014-01-18 NOTE — Patient Instructions (Signed)
Plan to follow up in six months or sooner if needed.  Please call for any concerns.  Call two to three months prior to your appt to schedule.

## 2014-01-18 NOTE — Progress Notes (Signed)
Consult Note: Gyn-Onc   [name] 63 y.o. female  No chief complaint on file.   Assessment : Paget's disease of the vulva currently in remission. 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  Plan: The patient return to see me in 6 months..    Interval History: Patient returns today as previously scheduled. Since her last visit she's done well. She denies any vulvar symptoms or any vulvar lesions.  HPI:The patient initially underwent surgery for Paget's disease of the vulva in October 2011. There was focal involvement of the medial margin. The entire lesion is approximately 5 cm in diameter.   The patient previously had a supracervical hysterectomy but has her ovaries and cervix in situ.  She notes that she had some repair done at the time of her hysterectomy and at her introitus is tight and she has difficulty having intercourse and also has tearing. On the other hand her husband has had prostate cancer surgery making intercourse difficult as well.     Review of Systems:10 point review of systems is negative except as noted in interval history.   Vitals: Blood pressure 119/59, pulse 58, temperature 98.9 F (37.2 C), temperature source Oral, resp. rate 20, height 5\' 4"  (1.626 m), weight 166 lb 11.2 oz (75.615 kg).  Physical Exam: General : The patient is a healthy woman in no acute distress.  HEENT: normocephalic, extraoccular movements normal; neck is supple without thyromegally  Lynphnodes: Supraclavicular and inguinal nodes not enlarged  Abdomen: Soft, non-tender, no ascites, no organomegally, no masses, no hernias  Pelvic:  EGBUS: Normal female status post partial vulvectomy with flattening of the right labia minora. No lesions are noted Vagina: Atrophic, no lesions  Urethra and Bladder: Normal, non-tender  Cervix: Atrophic Uterus: Surgically absent  Bi-manual examination: Non-tender; no adenxal masses or nodularity  Rectal:  normal sphincter tone, no masses, no blood  Lower extremities: No edema or varicosities. Normal range of motion      Allergies  Allergen Reactions  . Ibuprofen Hypertension  . Naprosyn [Naproxen] Nausea Only  . Other     Allergic to toothpaste- makes mouth peel Hay fever/dust mites/trees "365 allergic"   . Azithromycin Itching and Rash  . Sulfa Antibiotics Hives and Rash    Past Medical History  Diagnosis Date  . 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 S/P partial hysterectomy   . Hypertension   . Headache(784.0)     Migraines  . GERD (gastroesophageal reflux disease)   . GERD (gastroesophageal reflux disease)   . Arthritis   . Paget's disease of vulva   . Multiple allergies   . Vulvar mass 04/16/2011    Past Surgical History  Procedure Laterality Date  . Anterior and posterior repair  1989  . Simple vulvectomy  2011    right side  . Tonsilectomy, adenoidectomy, bilateral myringotomy and tubes    . Wisdom tooth extraction    . Hand surgery  2012    Left hand, ring finger cyst removal, trigger finger surg x 3 fingers  . Vaginal hysterectomy  1989    for prolapse  . Trigger finger surgery      Current Outpatient Prescriptions  Medication Sig Dispense Refill  . acetaminophen (TYLENOL) 650 MG CR tablet Take 650 mg by mouth every 4 (four) hours as needed. For pain/fever      . lansoprazole (PREVACID) 30 MG capsule Take 60 mg by mouth 2 (two) times daily.       Marland Kitchen  losartan-hydrochlorothiazide (HYZAAR) 100-25 MG per tablet TAKE 1 TABLET DAILY  90 tablet  0  . metoprolol tartrate (LOPRESSOR) 25 MG tablet Take 0.5 tablets (12.5 mg total) by mouth 2 (two) times daily.  90 tablet  0  . montelukast (SINGULAIR) 10 MG tablet TAKE 1 TABLET EVERY MORNING  90 tablet  0  . albuterol (PROVENTIL HFA;VENTOLIN HFA) 108 (90 BASE) MCG/ACT inhaler Inhale 2 puffs into the lungs every 6 (six) hours as needed. For wheezing      . beclomethasone (QVAR) 80 MCG/ACT inhaler Inhale 1  puff into the lungs daily.      . fluticasone (FLONASE) 50 MCG/ACT nasal spray Place 2 sprays into the nose daily as needed.        No current facility-administered medications for this visit.    History   Social History  . Marital Status: Married    Spouse Name: N/A    Number of Children: N/A  . Years of Education: N/A   Occupational History  . Not on file.   Social History Main Topics  . Smoking status: Former Smoker    Quit date: 04/16/1991  . Smokeless tobacco: Never Used  . Alcohol Use: Yes     Comment: occasional wine  . Drug Use: No  . Sexual Activity: Not on file   Other Topics Concern  . Not on file   Social History Narrative  . No narrative on file    Family History  Problem Relation Age of Onset  . Cancer Maternal Grandmother   . Hypertension Father   . Heart disease Father   . Hypertension Mother   . Heart disease Mother   . Cancer Paternal Grandmother     breast cancer  . Colon cancer Paternal Pasty Spillers, MD 01/18/2014, 2:56 PM

## 2014-02-04 ENCOUNTER — Other Ambulatory Visit: Payer: Self-pay | Admitting: Family Medicine

## 2014-02-27 ENCOUNTER — Encounter: Payer: Self-pay | Admitting: Family Medicine

## 2014-02-27 ENCOUNTER — Ambulatory Visit: Payer: Self-pay | Admitting: Family Medicine

## 2014-02-27 IMAGING — MG MM DIGITAL SCREENING BILAT W/ CAD
4 series · 4 of 4 positions shown · non-contrast
Comparison: Previous exam(s).

CLINICAL DATA: Screening.

EXAM:
DIGITAL SCREENING BILATERAL MAMMOGRAM WITH CAD

[R CC]
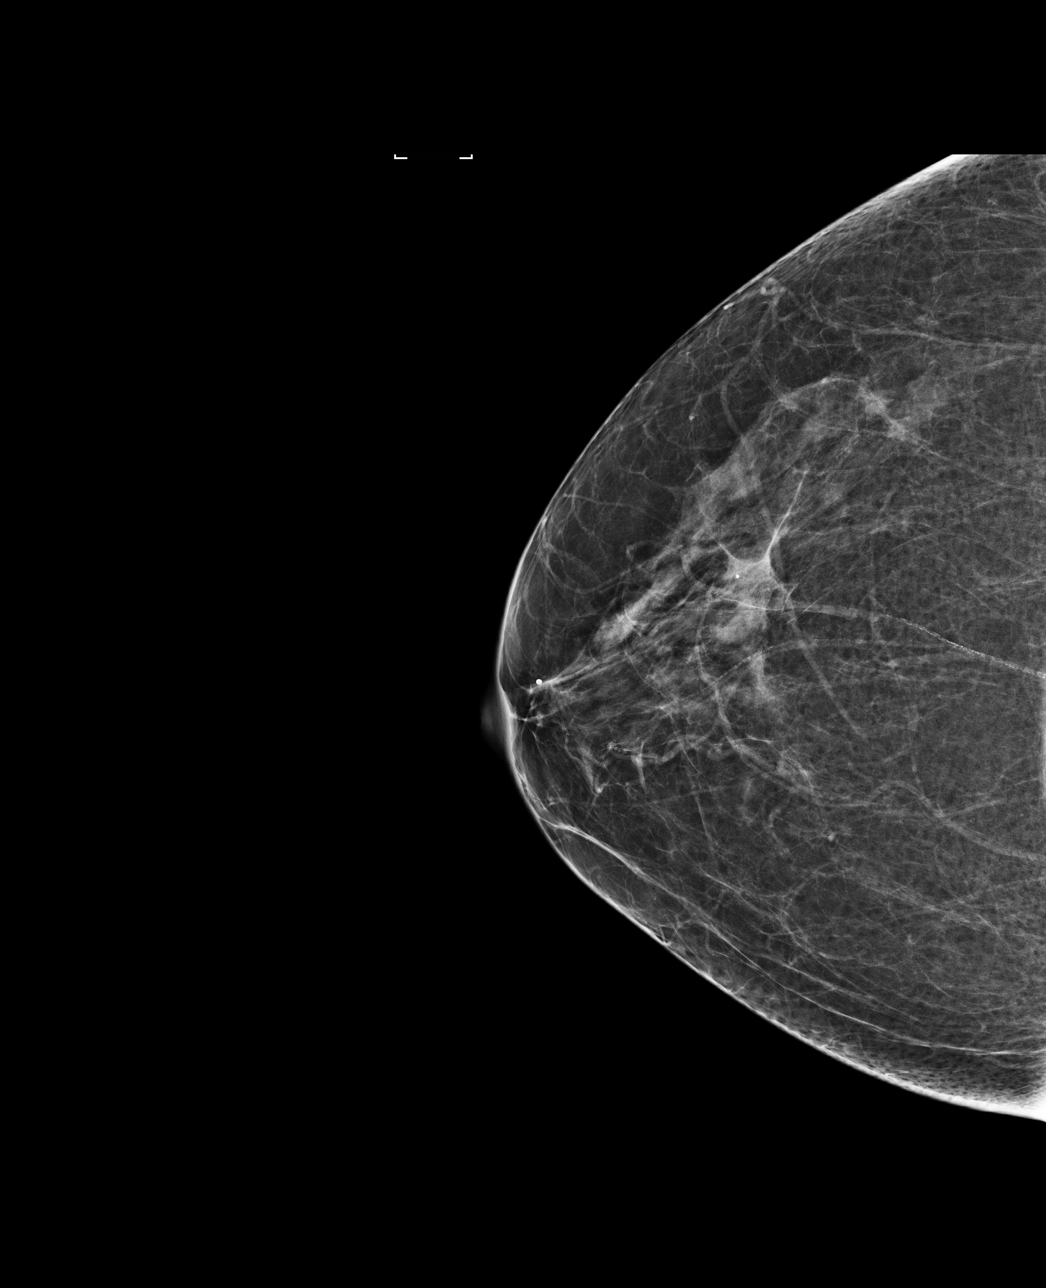

[L MLO]
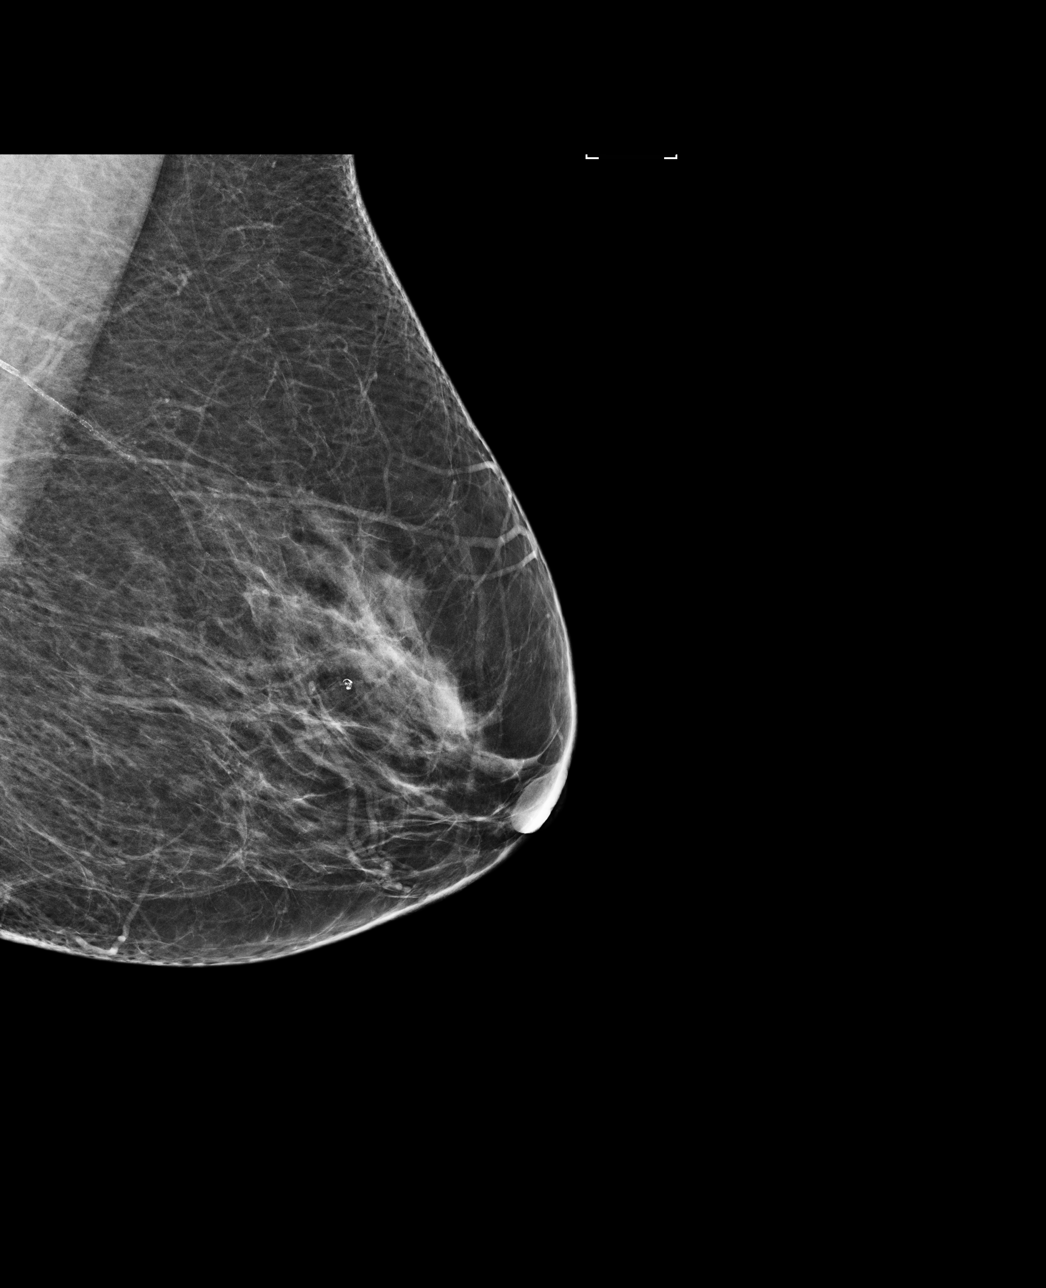

[L CC]
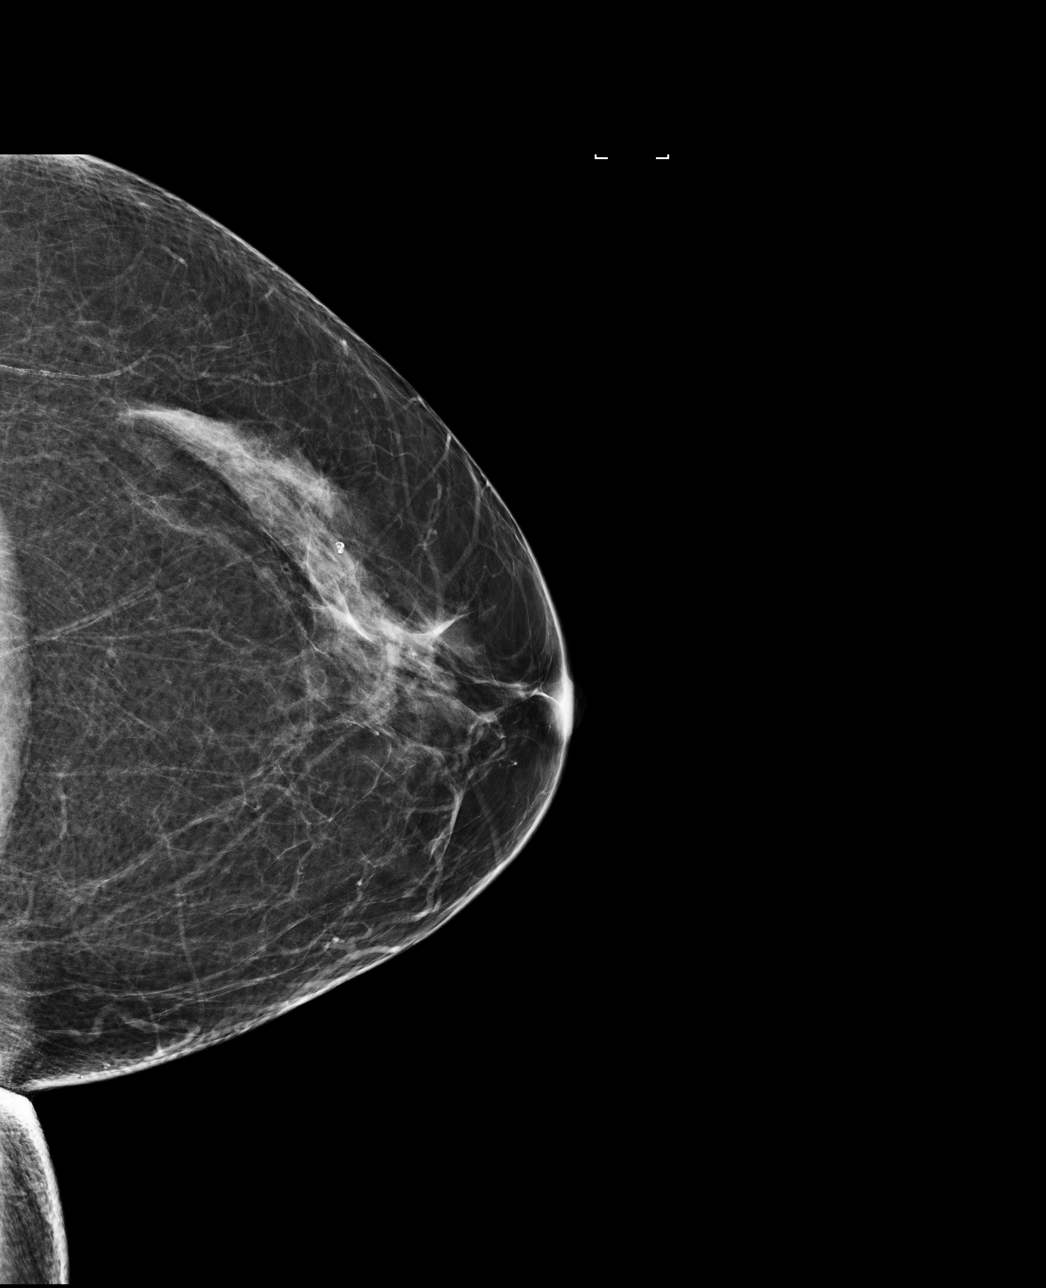

[R MLO]
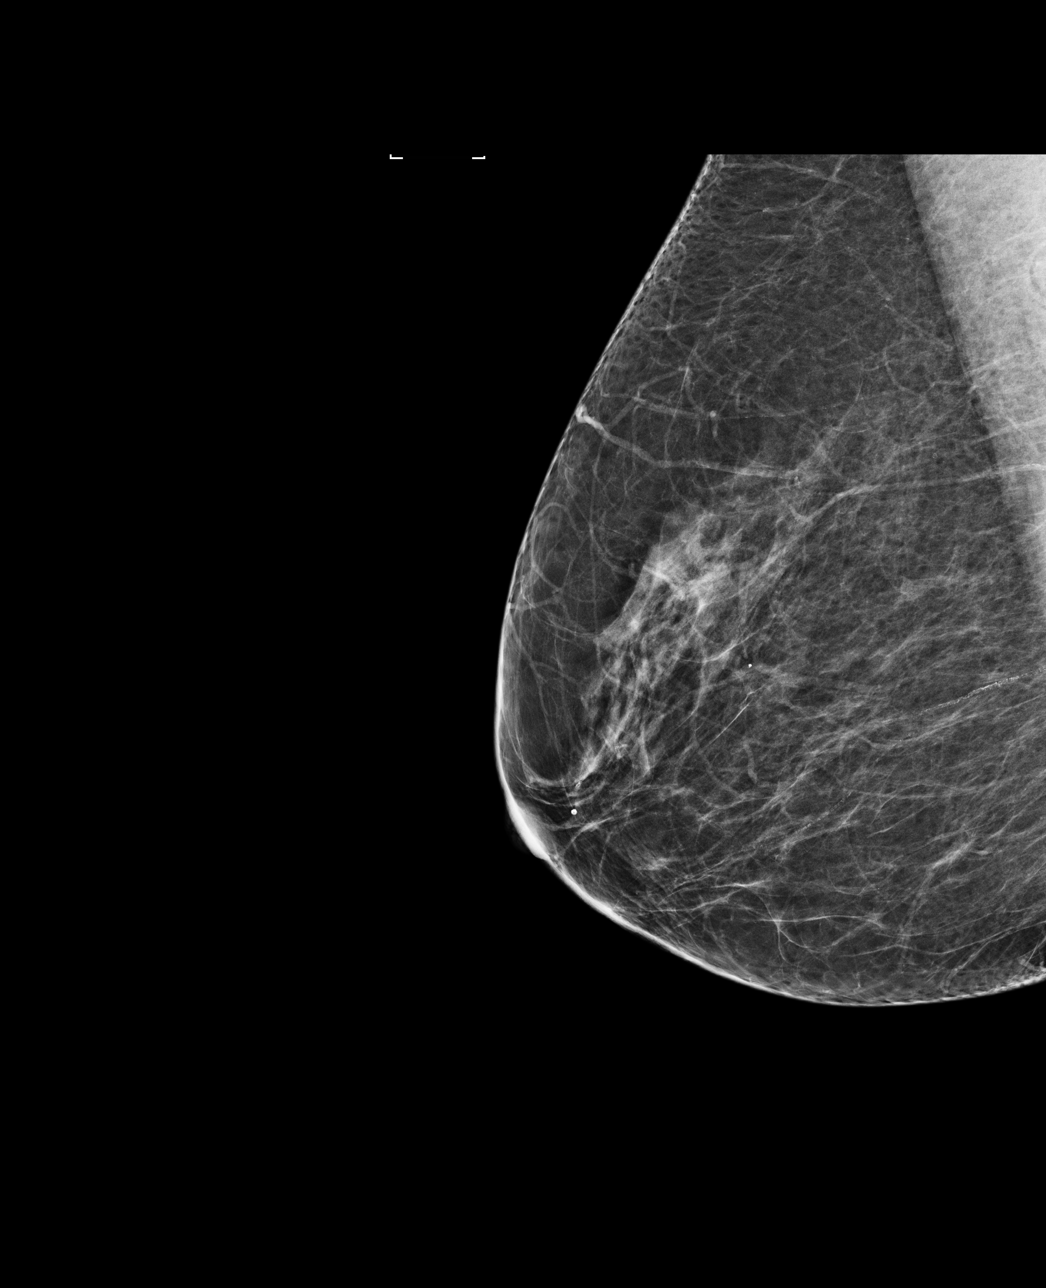

[4 of 4 positions shown; findings below may reference images not displayed]

ACR Breast Density Category b: There are scattered areas of
fibroglandular density.
FINDINGS: There are no findings suspicious for malignancy. Images were
processed with CAD.
IMPRESSION: No mammographic evidence of malignancy. A result letter of this
screening mammogram will be mailed directly to the patient.

RECOMMENDATION:
Screening mammogram in one year. (Code:[US])

BI-RADS CATEGORY  1: Negative.

## 2014-05-21 ENCOUNTER — Ambulatory Visit (INDEPENDENT_AMBULATORY_CARE_PROVIDER_SITE_OTHER): Payer: BC Managed Care – PPO | Admitting: Internal Medicine

## 2014-05-21 ENCOUNTER — Encounter: Payer: Self-pay | Admitting: Internal Medicine

## 2014-05-21 VITALS — BP 110/68 | HR 64 | Temp 97.8°F | Resp 12 | Wt 162.0 lb

## 2014-05-21 DIAGNOSIS — J0100 Acute maxillary sinusitis, unspecified: Secondary | ICD-10-CM | POA: Insufficient documentation

## 2014-05-21 DIAGNOSIS — J4520 Mild intermittent asthma, uncomplicated: Secondary | ICD-10-CM

## 2014-05-21 MED ORDER — AMOXICILLIN 500 MG PO TABS: 1000.0000 mg | ORAL_TABLET | Freq: Two times a day (BID) | ORAL | Status: DC

## 2014-05-21 NOTE — Progress Notes (Signed)
Subjective:    Patient ID: [name], female    DOB: 04/16/1951, 63 y.o.   MRN: [id number]  HPI Here for evaluation of cough  Started about 9 days ago [place] in choir and then started cough---voice cracking and congestion Then more congestion and feeling worse Had to travel for family issue---wore her down  Has been using q-var and other meds Tried mucinex and cough med No fever Just bad congestion in throat and really made voice bad  May be on the mend now but still congested Sinus congestion and pnd also  No wheezing Did have some chills but no sweats Muscle aching yesterday in legs  Slight blood in mucus yesterday--just once Sputum is yellow or green yellow  Current Outpatient Prescriptions on File Prior to Visit  Medication Sig Dispense Refill  . acetaminophen (TYLENOL) 650 MG CR tablet Take 650 mg by mouth every 4 (four) hours as needed. For pain/fever    . albuterol (PROVENTIL HFA;VENTOLIN HFA) 108 (90 BASE) MCG/ACT inhaler Inhale 2 puffs into the lungs every 6 (six) hours as needed. For wheezing    . beclomethasone (QVAR) 80 MCG/ACT inhaler Inhale 1 puff into the lungs daily.    . fluticasone (FLONASE) 50 MCG/ACT nasal spray Place 2 sprays into the nose daily as needed.     . lansoprazole (PREVACID) 30 MG capsule Take 60 mg by mouth 2 (two) times daily.     Marland Kitchen losartan-hydrochlorothiazide (HYZAAR) 100-25 MG per tablet TAKE 1 TABLET DAILY 90 tablet 1  . metoprolol tartrate (LOPRESSOR) 25 MG tablet Take 0.5 tablets (12.5 mg total) by mouth 2 (two) times daily. 90 tablet 0  . montelukast (SINGULAIR) 10 MG tablet TAKE 1 TABLET EVERY MORNING 90 tablet 1   No current facility-administered medications on file prior to visit.    Allergies  Allergen Reactions  . Ibuprofen Hypertension  . Naprosyn [Naproxen] Nausea Only  . Other     Allergic to toothpaste- makes mouth peel Hay fever/dust mites/trees "365 allergic"   . Azithromycin Itching and Rash  . Sulfa Antibiotics  Hives and Rash    Past Medical History  Diagnosis Date  . 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 S/P partial hysterectomy   . Hypertension   . Headache(784.0)     Migraines  . GERD (gastroesophageal reflux disease)   . GERD (gastroesophageal reflux disease)   . Arthritis   . Paget's disease of vulva   . Multiple allergies   . Vulvar mass 04/16/2011    Past Surgical History  Procedure Laterality Date  . Anterior and posterior repair  1989  . Simple vulvectomy  2011    right side  . Tonsilectomy, adenoidectomy, bilateral myringotomy and tubes    . Wisdom tooth extraction    . Hand surgery  2012    Left hand, ring finger cyst removal, trigger finger surg x 3 fingers  . Vaginal hysterectomy  1989    for prolapse  . Trigger finger surgery      Family History  Problem Relation Age of Onset  . Cancer Maternal Grandmother   . Hypertension Father   . Heart disease Father   . Hypertension Mother   . Heart disease Mother   . Cancer Paternal Grandmother     breast cancer  . Colon cancer Paternal Uncle     History   Social History  . Marital Status: Married    Spouse Name: N/A    Number  of Children: N/A  . Years of Education: N/A   Occupational History  . Not on file.   Social History Main Topics  . Smoking status: Former Smoker    Quit date: 04/16/1991  . Smokeless tobacco: Never Used  . Alcohol Use: Yes     Comment: occasional wine  . Drug Use: No  . Sexual Activity: Not on file   Other Topics Concern  . Not on file   Social History Narrative    Review of Systems  No rash No vomiting or diarrhea Appetite is okay     Objective:   Physical Exam  Constitutional: She appears well-developed and well-nourished.  HENT:  Mouth/Throat: Oropharynx is clear and moist. No oropharyngeal exudate.  No sinus tenderness TMs normal Mild nasal inflammation   Neck: Normal range of motion. Neck supple. No thyromegaly present.  Pulmonary/Chest:  Effort normal and breath sounds normal. No respiratory distress. She has no wheezes. She has no rales.  Lymphadenopathy:    She has no cervical adenopathy.          Assessment & Plan:

## 2014-05-21 NOTE — Assessment & Plan Note (Signed)
Not exacerbated No changes needed

## 2014-05-21 NOTE — Progress Notes (Signed)
Pre visit review using our clinic review tool, if applicable. No additional management support is needed unless otherwise documented below in the visit note. 

## 2014-05-21 NOTE — Patient Instructions (Signed)
Please start the antibiotic if you are getting worse over the next few days.

## 2014-05-21 NOTE — Assessment & Plan Note (Signed)
Also with laryngitis Discussed that this is usually viral Some sinus symptoms --not clear if secondary bacterial infection now Discussed supportive care If worsens--start amoxil

## 2014-05-23 ENCOUNTER — Telehealth: Payer: Self-pay

## 2014-05-23 ENCOUNTER — Telehealth: Payer: Self-pay | Admitting: Family Medicine

## 2014-05-23 NOTE — Telephone Encounter (Signed)
[name] with disability claims left v/m; received info that pt was seen on 05/21/14 for sinusitis and cough.request cb should pt be out on disability and if so how long should pt be out of work on disability. [place] request cb and claim # [id number].Please advise.

## 2014-05-23 NOTE — Telephone Encounter (Signed)
.  left message to have [employer] return my call.

## 2014-05-23 NOTE — Telephone Encounter (Signed)
I am okay with certifying her out She had laryngitis and couldn't talk on the phone Honestly, there was nothing to treat at first so she didn't really need to come in (other than certifying that she couldn't talk)

## 2014-05-23 NOTE — Telephone Encounter (Signed)
Pt calls in today bc she needs her office note to specify exactly how long she was sick for. She was sick since 12/8 but was not seen until 12/15. She is requesting a call back regarding this. She was due to return back to work tomorrow but has not picked up rx and is not feeling any better so she plans to get rx and stay out of work a little longer.

## 2014-05-23 NOTE — Telephone Encounter (Signed)
Please call the patient first--find out if she needed time off and how much I would have expected that she might need 2-3 days off since she talks all day on the phone and she had laryngitis. Okay to call disability person and approve the time she needed off

## 2014-05-23 NOTE — Telephone Encounter (Signed)
Spoke with patient and she would like Dr. Silvio Pate to sign disability papers from 05/13/14 until 05/24/2014. Pt states she was out of work all last week. She just got the prescription filled today and will go back to work on Monday 12/21. Please advise

## 2014-05-23 NOTE — Telephone Encounter (Signed)
Spoke with [employer] rep and she wanted to know why pt was out 7 days before she was seen? She's asking if Dr. Silvio Pate will certify the pt out 43/2 until now. She has been in contact with the patient and per the pt she mentioned she was out last week?

## 2014-05-23 NOTE — Telephone Encounter (Signed)
Okay  She did tell me she couldn't talk so that sounds appropriate

## 2014-05-23 NOTE — Telephone Encounter (Signed)
[name] with [employer] left v/m requesting cb at [phone] or [phone].

## 2014-05-24 NOTE — Telephone Encounter (Signed)
.  left message to have [employer] return my call.

## 2014-05-27 NOTE — Telephone Encounter (Signed)
[name] with [employer] left v/m returning call; [name] needs following info: needs to know estimated recovery time;is Dr [name] going to write pt out of work,if so how long will pt need to be out of work; pt first day out of work was Dec 8,2015 and has condition worsened or improved. Does pt have any scheduled f/u office appts. [name] request cb and can leave detailed message and use claim # [id number].

## 2014-05-28 NOTE — Telephone Encounter (Signed)
Spoke with [facility] and stated that Dr. Silvio Pate would write pt out of work from 12/8-12/18.

## 2014-06-28 ENCOUNTER — Other Ambulatory Visit: Payer: Self-pay | Admitting: Family Medicine

## 2014-09-10 ENCOUNTER — Telehealth: Payer: Self-pay | Admitting: *Deleted

## 2014-09-10 NOTE — Telephone Encounter (Signed)
Pt called requesting to schedule an appointment with Dr. Fermin Schwab stating "I've had some itching on my vulva close to where Dr. Fermin Schwab did my previous surgery." Patient given appt for Friday, 09/20/14, at 1:45pm. Told patient that if she cannot keep the appt for any reason to please call us back - patient agreeable to this.

## 2014-09-20 ENCOUNTER — Ambulatory Visit: Payer: BLUE CROSS/BLUE SHIELD | Attending: Gynecology | Admitting: Gynecology

## 2014-09-20 ENCOUNTER — Encounter: Payer: Self-pay | Admitting: Gynecology

## 2014-09-20 ENCOUNTER — Ambulatory Visit: Payer: Self-pay | Admitting: Gynecology

## 2014-09-20 VITALS — BP 115/61 | HR 58 | Temp 98.6°F | Resp 18 | Ht 64.0 in | Wt 161.7 lb

## 2014-09-20 DIAGNOSIS — L292 Pruritus vulvae: Secondary | ICD-10-CM

## 2014-09-20 DIAGNOSIS — C4499 Other specified malignant neoplasm of skin, unspecified: Secondary | ICD-10-CM | POA: Diagnosis present

## 2014-09-20 DIAGNOSIS — Z79891 Long term (current) use of opiate analgesic: Secondary | ICD-10-CM | POA: Diagnosis not present

## 2014-09-20 DIAGNOSIS — I10 Essential (primary) hypertension: Secondary | ICD-10-CM | POA: Insufficient documentation

## 2014-09-20 DIAGNOSIS — M1990 Unspecified osteoarthritis, unspecified site: Secondary | ICD-10-CM | POA: Insufficient documentation

## 2014-09-20 DIAGNOSIS — K219 Gastro-esophageal reflux disease without esophagitis: Secondary | ICD-10-CM | POA: Insufficient documentation

## 2014-09-20 DIAGNOSIS — Z79899 Other long term (current) drug therapy: Secondary | ICD-10-CM | POA: Insufficient documentation

## 2014-09-20 DIAGNOSIS — Z90710 Acquired absence of both cervix and uterus: Secondary | ICD-10-CM | POA: Insufficient documentation

## 2014-09-20 DIAGNOSIS — C519 Malignant neoplasm of vulva, unspecified: Secondary | ICD-10-CM | POA: Diagnosis not present

## 2014-09-20 NOTE — Progress Notes (Signed)
Consult Note: Gyn-Onc   [name] 64 y.o. female  Chief Complaint  Patient presents with  . Paget's disease    Assessment : Paget's disease of the vulva currently in remission. New-onset of pruritus of question etiology.  Plan: Given that unable to identify any lesions, we'll treat the patient symptomatically. She is given a prescription for clobetasol 0.05% ointment be applied twice a day the RN. The patient return to see me in 3 months to be reexamined...    Interval History: Patient returns today as previously scheduled. Since her last visit she's done well.  However for the last 3 weeks she's had intermittent pruritus on the right labia majora. She has not noticed any lesions. She has no discharge or bleeding.Marland Kitchen  HPI:The patient initially underwent surgery for Paget's disease of the vulva in October 2011. There was focal involvement of the medial margin. The entire lesion is approximately 5 cm in diameter.   The patient previously had a supracervical hysterectomy but has her ovaries and cervix in situ.  She notes that she had some repair done at the time of her hysterectomy and at her introitus is tight and she has difficulty having intercourse and also has tearing. On the other hand her husband has had prostate cancer surgery making intercourse difficult as well.     Review of Systems:10 point review of systems is negative except as noted in interval history.   Vitals: Blood pressure 115/61, pulse 58, temperature 98.6 F (37 C), temperature source Oral, resp. rate 18, height 5\' 4"  (1.626 m), weight 161 lb 11.2 oz (73.347 kg).  Physical Exam: General : The patient is a healthy woman in no acute distress.  HEENT: normocephalic, extraoccular movements normal; neck is supple without thyromegally  Lynphnodes: Supraclavicular and inguinal nodes not enlarged  Abdomen: Soft, non-tender, no ascites, no organomegally, no masses, no hernias  Pelvic:  EGBUS: Normal female status post  partial vulvectomy with flattening of the right labia minora. No lesions are noted Vagina: Atrophic, no lesions  Urethra and Bladder: Normal, non-tender  Cervix: Atrophic Uterus: Surgically absent  Bi-manual examination: Non-tender; no adenxal masses or nodularity  Rectal: normal sphincter tone, no masses, no blood  Lower extremities: No edema or varicosities. Normal range of motion      Allergies  Allergen Reactions  . Ibuprofen Hypertension  . Naprosyn [Naproxen] Nausea Only  . Other     Allergic to toothpaste- makes mouth peel Hay fever/dust mites/trees "365 allergic"   . Azithromycin Itching and Rash  . Sulfa Antibiotics Hives and Rash    Past Medical History  Diagnosis Date  . 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 S/P partial hysterectomy   . Hypertension   . Headache(784.0)     Migraines  . GERD (gastroesophageal reflux disease)   . GERD (gastroesophageal reflux disease)   . Arthritis   . Paget's disease of vulva   . Multiple allergies   . Vulvar mass 04/16/2011    Past Surgical History  Procedure Laterality Date  . Anterior and posterior repair  1989  . Simple vulvectomy  2011    right side  . Tonsilectomy, adenoidectomy, bilateral myringotomy and tubes    . Wisdom tooth extraction    . Hand surgery  2012    Left hand, ring finger cyst removal, trigger finger surg x 3 fingers  . Vaginal hysterectomy  1989    for prolapse  . Trigger finger surgery  Current Outpatient Prescriptions  Medication Sig Dispense Refill  . acetaminophen (TYLENOL) 650 MG CR tablet Take 650 mg by mouth every 4 (four) hours as needed. For pain/fever    . albuterol (PROVENTIL HFA;VENTOLIN HFA) 108 (90 BASE) MCG/ACT inhaler Inhale 2 puffs into the lungs every 6 (six) hours as needed. For wheezing    . beclomethasone (QVAR) 80 MCG/ACT inhaler Inhale 1 puff into the lungs daily.    . fluticasone (FLONASE) 50 MCG/ACT nasal spray Place 2 sprays into the nose daily as  needed.     . lansoprazole (PREVACID) 30 MG capsule Take 60 mg by mouth 2 (two) times daily.     Marland Kitchen losartan-hydrochlorothiazide (HYZAAR) 100-25 MG per tablet TAKE 1 TABLET DAILY 90 tablet 1  . metoprolol tartrate (LOPRESSOR) 25 MG tablet Take 0.5 tablets (12.5 mg total) by mouth 2 (two) times daily. 90 tablet 0  . montelukast (SINGULAIR) 10 MG tablet TAKE 1 TABLET EVERY MORNING 90 tablet 1   No current facility-administered medications for this visit.    History   Social History  . Marital Status: Married    Spouse Name: N/A  . Number of Children: N/A  . Years of Education: N/A   Occupational History  . Not on file.   Social History Main Topics  . Smoking status: Former Smoker    Quit date: 04/16/1991  . Smokeless tobacco: Never Used  . Alcohol Use: 0.6 oz/week    1 Glasses of wine per week     Comment: occasional wine  . Drug Use: No  . Sexual Activity: Not on file   Other Topics Concern  . Not on file   Social History Narrative    Family History  Problem Relation Age of Onset  . Cancer Maternal Grandmother   . Hypertension Father   . Heart disease Father   . Hypertension Mother   . Heart disease Mother   . Cancer Paternal Grandmother     breast cancer  . Colon cancer Paternal Pasty Spillers, MD 09/20/2014, 1:09 PM

## 2014-09-20 NOTE — Patient Instructions (Signed)
Apply clobetasol twice a day as needed for itching. Return to see me in 3 months.

## 2014-09-23 ENCOUNTER — Other Ambulatory Visit: Payer: Self-pay | Admitting: Nurse Practitioner

## 2014-09-23 DIAGNOSIS — C4499 Other specified malignant neoplasm of skin, unspecified: Secondary | ICD-10-CM

## 2014-09-23 DIAGNOSIS — C519 Malignant neoplasm of vulva, unspecified: Secondary | ICD-10-CM

## 2014-09-23 MED ORDER — CLOBETASOL PROPIONATE 0.05 % EX OINT: 1.0000 "application " | TOPICAL_OINTMENT | Freq: Two times a day (BID) | CUTANEOUS | Status: DC | PRN

## 2014-09-23 NOTE — Progress Notes (Signed)
Per telephone call with RN, patient states she did not receive rx for topical ointment at last apt with Dr. Fermin Schwab. This has been electronically sent to pharmacy of choice per [name], NP.

## 2014-10-21 ENCOUNTER — Encounter: Payer: Self-pay | Admitting: Internal Medicine

## 2014-12-20 ENCOUNTER — Ambulatory Visit: Payer: BLUE CROSS/BLUE SHIELD | Attending: Gynecology | Admitting: Gynecology

## 2014-12-20 ENCOUNTER — Encounter: Payer: Self-pay | Admitting: Gynecology

## 2014-12-20 VITALS — BP 122/73 | HR 58 | Temp 98.1°F | Resp 20 | Ht 64.0 in | Wt 162.0 lb

## 2014-12-20 DIAGNOSIS — C519 Malignant neoplasm of vulva, unspecified: Secondary | ICD-10-CM

## 2014-12-20 DIAGNOSIS — C4499 Other specified malignant neoplasm of skin, unspecified: Secondary | ICD-10-CM | POA: Insufficient documentation

## 2014-12-20 NOTE — Patient Instructions (Signed)
We will contact you with the results of your biopsy from today.  Please call for any questions or concerns.

## 2014-12-20 NOTE — Progress Notes (Signed)
Consult Note: Gyn-Onc   [name] 64 y.o. female  Chief Complaint  Patient presents with  . paget's disease of vulva    follow-up    Assessment : Paget's disease of the vulva with suspicious area on the right anterior vulva. Pending biopsy we may need to perform wide local excision.  Plan: We will contact patient with pathology report and range wide local excision if necessary.   Interval History: Patient returns today as previously scheduled. Since her last visit she's done well. She continues to have intermittent pruritus of the right anterior vulva.  HPI:The patient initially underwent surgery for Paget's disease of the vulva in October 2011. There was focal involvement of the medial margin. The entire lesion is approximately 5 cm in diameter.   The patient previously had a supracervical hysterectomy but has her ovaries and cervix in situ.  She notes that she had some repair done at the time of her hysterectomy and at her introitus is tight and she has difficulty having intercourse and also has tearing. On the other hand her husband has had prostate cancer surgery making intercourse difficult as well.     Review of Systems:10 point review of systems is negative except as noted in interval history.   Vitals: Blood pressure 122/73, pulse 58, temperature 98.1 F (36.7 C), temperature source Oral, resp. rate 20, height 5\' 4"  (1.626 m), weight 162 lb (73.483 kg), SpO2 100 %.  Physical Exam: General : The patient is a healthy woman in no acute distress.  HEENT: normocephalic, extraoccular movements normal; neck is supple without thyromegally  Lynphnodes: Supraclavicular and inguinal nodes not enlarged  Abdomen: Soft, non-tender, no ascites, no organomegally, no masses, no hernias  Pelvic:  EGBUS: Normal female status post partial vulvectomy with flattening of the right labia minora. There is an area approximately 3 cm in diameter in the right anterior vulva that is more  erythematous than the remainder the skin and a suspicious for recurrent Paget's disease. Vagina: Atrophic, no lesions  Urethra and Bladder: Normal, non-tender  Cervix: Atrophic Uterus: Surgically absent  Bi-manual examination: Non-tender; no adenxal masses or nodularity  Rectal: normal sphincter tone, no masses, no blood  Lower extremities: No edema or varicosities. Normal range of motion      Procedure note after verbal informed consent and timeout the vulva was prepped with Betadine and 1% Xylocaine is injected. A punch biopsy is obtained hemostasis achieved with silver nitrate. Specimen was submitted to pathology.    Allergies  Allergen Reactions  . Ibuprofen Hypertension  . Naprosyn [Naproxen] Nausea Only  . Other     Allergic to toothpaste- makes mouth peel Hay fever/dust mites/trees "365 allergic"   . Azithromycin Itching and Rash  . Sulfa Antibiotics Hives and Rash    Past Medical History  Diagnosis Date  . 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 S/P partial hysterectomy   . Hypertension   . Headache(784.0)     Migraines  . GERD (gastroesophageal reflux disease)   . GERD (gastroesophageal reflux disease)   . Arthritis   . Paget's disease of vulva   . Multiple allergies   . Vulvar mass 04/16/2011    Past Surgical History  Procedure Laterality Date  . Anterior and posterior repair  1989  . Simple vulvectomy  2011    right side  . Tonsilectomy, adenoidectomy, bilateral myringotomy and tubes    . Wisdom tooth extraction    . Hand surgery  2012    Left hand, ring finger cyst removal, trigger finger surg x 3 fingers  . Vaginal hysterectomy  1989    for prolapse  . Trigger finger surgery      Current Outpatient Prescriptions  Medication Sig Dispense Refill  . acetaminophen (TYLENOL) 650 MG CR tablet Take 650 mg by mouth every 4 (four) hours as needed. For pain/fever    . clobetasol ointment (TEMOVATE) 3.55 % Apply 1 application topically 2 (two)  times daily as needed. 15 g 2  . fluticasone (FLONASE) 50 MCG/ACT nasal spray Place 2 sprays into the nose daily as needed.     . lansoprazole (PREVACID) 30 MG capsule Take 60 mg by mouth 2 (two) times daily.     Marland Kitchen losartan-hydrochlorothiazide (HYZAAR) 100-25 MG per tablet TAKE 1 TABLET DAILY 90 tablet 1  . MELATONIN PO Take 1 tablet by mouth at bedtime.    . metoprolol tartrate (LOPRESSOR) 25 MG tablet Take 0.5 tablets (12.5 mg total) by mouth 2 (two) times daily. 90 tablet 0  . montelukast (SINGULAIR) 10 MG tablet TAKE 1 TABLET EVERY MORNING 90 tablet 1  . albuterol (PROVENTIL HFA;VENTOLIN HFA) 108 (90 BASE) MCG/ACT inhaler Inhale 2 puffs into the lungs every 6 (six) hours as needed. For wheezing    . beclomethasone (QVAR) 80 MCG/ACT inhaler Inhale 1 puff into the lungs daily.     No current facility-administered medications for this visit.    History   Social History  . Marital Status: Married    Spouse Name: N/A  . Number of Children: N/A  . Years of Education: N/A   Occupational History  . Not on file.   Social History Main Topics  . Smoking status: Former Smoker    Quit date: 04/16/1991  . Smokeless tobacco: Never Used  . Alcohol Use: 0.6 oz/week    1 Glasses of wine per week     Comment: occasional wine  . Drug Use: No  . Sexual Activity: Not on file   Other Topics Concern  . Not on file   Social History Narrative    Family History  Problem Relation Age of Onset  . Cancer Maternal Grandmother   . Hypertension Father   . Heart disease Father   . Hypertension Mother   . Heart disease Mother   . Cancer Paternal Grandmother     breast cancer  . Colon cancer Paternal Pasty Spillers, MD 12/20/2014, 3:37 PM

## 2014-12-26 ENCOUNTER — Other Ambulatory Visit: Payer: Self-pay | Admitting: Family Medicine

## 2014-12-26 NOTE — Telephone Encounter (Signed)
Please call and schedule CPE with fasting labs prior with Dr. Bedsole. 

## 2015-01-07 ENCOUNTER — Encounter (HOSPITAL_BASED_OUTPATIENT_CLINIC_OR_DEPARTMENT_OTHER): Payer: Self-pay | Admitting: *Deleted

## 2015-01-08 ENCOUNTER — Encounter (HOSPITAL_BASED_OUTPATIENT_CLINIC_OR_DEPARTMENT_OTHER): Payer: Self-pay | Admitting: *Deleted

## 2015-01-08 NOTE — Progress Notes (Signed)
NPO AFTER MN.  ARRIVE AT 0630.  NEEDS ISTAT AND EKG .  WILL TAKE METOPROLOL, PREVACID, AND SINGULAIR AM DOS W/ SIPS OF WATER.

## 2015-01-10 ENCOUNTER — Ambulatory Visit (HOSPITAL_BASED_OUTPATIENT_CLINIC_OR_DEPARTMENT_OTHER): Payer: BLUE CROSS/BLUE SHIELD | Admitting: Anesthesiology

## 2015-01-10 ENCOUNTER — Other Ambulatory Visit: Payer: Self-pay

## 2015-01-10 ENCOUNTER — Ambulatory Visit (HOSPITAL_BASED_OUTPATIENT_CLINIC_OR_DEPARTMENT_OTHER)
Admission: RE | Admit: 2015-01-10 | Discharge: 2015-01-10 | Disposition: A | Discharge status: Home / Self Care | Payer: BLUE CROSS/BLUE SHIELD | Source: Ambulatory Visit | Attending: Gynecology | Admitting: Gynecology

## 2015-01-10 ENCOUNTER — Other Ambulatory Visit: Payer: Self-pay | Admitting: Gynecologic Oncology

## 2015-01-10 ENCOUNTER — Encounter (HOSPITAL_BASED_OUTPATIENT_CLINIC_OR_DEPARTMENT_OTHER): Payer: Self-pay | Admitting: Anesthesiology

## 2015-01-10 ENCOUNTER — Encounter (HOSPITAL_BASED_OUTPATIENT_CLINIC_OR_DEPARTMENT_OTHER)
Admission: RE | Disposition: A | Discharge status: Home / Self Care | Payer: Self-pay | Source: Ambulatory Visit | Attending: Gynecology

## 2015-01-10 DIAGNOSIS — D071 Carcinoma in situ of vulva: Secondary | ICD-10-CM | POA: Diagnosis not present

## 2015-01-10 DIAGNOSIS — Z87891 Personal history of nicotine dependence: Secondary | ICD-10-CM | POA: Insufficient documentation

## 2015-01-10 DIAGNOSIS — G43909 Migraine, unspecified, not intractable, without status migrainosus: Secondary | ICD-10-CM | POA: Diagnosis not present

## 2015-01-10 DIAGNOSIS — K219 Gastro-esophageal reflux disease without esophagitis: Secondary | ICD-10-CM | POA: Diagnosis not present

## 2015-01-10 DIAGNOSIS — R001 Bradycardia, unspecified: Secondary | ICD-10-CM | POA: Diagnosis not present

## 2015-01-10 DIAGNOSIS — I10 Essential (primary) hypertension: Secondary | ICD-10-CM | POA: Insufficient documentation

## 2015-01-10 DIAGNOSIS — Z7952 Long term (current) use of systemic steroids: Secondary | ICD-10-CM | POA: Diagnosis not present

## 2015-01-10 DIAGNOSIS — Z7951 Long term (current) use of inhaled steroids: Secondary | ICD-10-CM | POA: Diagnosis not present

## 2015-01-10 DIAGNOSIS — N903 Dysplasia of vulva, unspecified: Secondary | ICD-10-CM

## 2015-01-10 DIAGNOSIS — M1990 Unspecified osteoarthritis, unspecified site: Secondary | ICD-10-CM | POA: Insufficient documentation

## 2015-01-10 DIAGNOSIS — G8918 Other acute postprocedural pain: Secondary | ICD-10-CM

## 2015-01-10 DIAGNOSIS — Z79899 Other long term (current) drug therapy: Secondary | ICD-10-CM | POA: Diagnosis not present

## 2015-01-10 DIAGNOSIS — C511 Malignant neoplasm of labium minus: Secondary | ICD-10-CM | POA: Insufficient documentation

## 2015-01-10 HISTORY — DX: Personal history of other specified conditions: Z87.898

## 2015-01-10 HISTORY — DX: Allergic rhinitis, unspecified: J30.9

## 2015-01-10 HISTORY — DX: Personal history of traumatic brain injury: Z87.820

## 2015-01-10 HISTORY — DX: Personal history of colonic polyps: Z86.010

## 2015-01-10 HISTORY — DX: Presence of spectacles and contact lenses: Z97.3

## 2015-01-10 HISTORY — PX: VULVECTOMY: SHX1086

## 2015-01-10 HISTORY — DX: Personal history of adenomatous and serrated colon polyps: Z86.0101

## 2015-01-10 LAB — POCT I-STAT 4, (NA,K, GLUC, HGB,HCT)
Glucose, Bld: 98 mg/dL (ref 65–99)
HCT: 37 % (ref 36.0–46.0)
Hemoglobin: 12.6 g/dL (ref 12.0–15.0)
Potassium: 3.6 mmol/L (ref 3.5–5.1)
Sodium: 139 mmol/L (ref 135–145)

## 2015-01-10 SURGERY — WIDE EXCISION VULVECTOMY
Anesthesia: General | Site: Vulva | Laterality: Right

## 2015-01-10 MED ORDER — OXYCODONE HCL 5 MG PO TABS
5.0000 mg | ORAL_TABLET | ORAL | Status: DC | PRN
  Administered 2015-01-10: 5 mg via ORAL
  Filled 2015-01-10: qty 2

## 2015-01-10 MED ORDER — PROPOFOL 10 MG/ML IV BOLUS
INTRAVENOUS | Status: DC | PRN
  Administered 2015-01-10: 170 mg via INTRAVENOUS

## 2015-01-10 MED ORDER — FENTANYL CITRATE (PF) 100 MCG/2ML IJ SOLN
INTRAMUSCULAR | Status: AC
  Filled 2015-01-10: qty 2

## 2015-01-10 MED ORDER — DEXAMETHASONE SODIUM PHOSPHATE 4 MG/ML IJ SOLN
INTRAMUSCULAR | Status: DC | PRN
  Administered 2015-01-10: 10 mg via INTRAVENOUS

## 2015-01-10 MED ORDER — HYDROMORPHONE HCL 1 MG/ML IJ SOLN
0.2500 mg | INTRAMUSCULAR | Status: DC | PRN
  Filled 2015-01-10: qty 1

## 2015-01-10 MED ORDER — HYDROCODONE-ACETAMINOPHEN 5-325 MG PO TABS: 1.0000 | ORAL_TABLET | Freq: Four times a day (QID) | ORAL | Status: DC | PRN

## 2015-01-10 MED ORDER — LIDOCAINE HCL (CARDIAC) 20 MG/ML IV SOLN
INTRAVENOUS | Status: DC | PRN
  Administered 2015-01-10: 60 mg via INTRAVENOUS

## 2015-01-10 MED ORDER — MEPERIDINE HCL 25 MG/ML IJ SOLN
6.2500 mg | INTRAMUSCULAR | Status: DC | PRN
  Filled 2015-01-10: qty 1

## 2015-01-10 MED ORDER — LACTATED RINGERS IV SOLN
INTRAVENOUS | Status: DC
  Administered 2015-01-10 (×3): via INTRAVENOUS
  Filled 2015-01-10: qty 1000

## 2015-01-10 MED ORDER — MIDAZOLAM HCL 2 MG/2ML IJ SOLN
INTRAMUSCULAR | Status: AC
  Filled 2015-01-10: qty 2

## 2015-01-10 MED ORDER — OXYCODONE HCL 5 MG PO TABS
ORAL_TABLET | ORAL | Status: AC
  Filled 2015-01-10: qty 1

## 2015-01-10 MED ORDER — FENTANYL CITRATE (PF) 100 MCG/2ML IJ SOLN
INTRAMUSCULAR | Status: DC | PRN
  Administered 2015-01-10: 50 ug via INTRAVENOUS

## 2015-01-10 MED ORDER — SODIUM CHLORIDE 0.9 % IJ SOLN
3.0000 mL | INTRAMUSCULAR | Status: DC | PRN
  Filled 2015-01-10: qty 3

## 2015-01-10 MED ORDER — MIDAZOLAM HCL 5 MG/5ML IJ SOLN
INTRAMUSCULAR | Status: DC | PRN
  Administered 2015-01-10: 2 mg via INTRAVENOUS

## 2015-01-10 MED ORDER — ACETAMINOPHEN 500 MG PO TABS
1000.0000 mg | ORAL_TABLET | Freq: Four times a day (QID) | ORAL | Status: DC
  Filled 2015-01-10: qty 2

## 2015-01-10 MED ORDER — ONDANSETRON HCL 4 MG/2ML IJ SOLN
4.0000 mg | Freq: Once | INTRAMUSCULAR | Status: AC | PRN
  Administered 2015-01-10: 4 mg via INTRAVENOUS
  Filled 2015-01-10: qty 2

## 2015-01-10 MED ORDER — BUPIVACAINE HCL (PF) 0.25 % IJ SOLN
INTRAMUSCULAR | Status: DC | PRN
  Administered 2015-01-10: 3 mL

## 2015-01-10 SURGICAL SUPPLY — 35 items
APPLICATOR COTTON TIP 6IN STRL (MISCELLANEOUS) ×2 IMPLANT
BLADE SURG 15 STRL LF DISP TIS (BLADE) ×1 IMPLANT
BLADE SURG 15 STRL SS (BLADE) ×1
CANISTER SUCTION 2500CC (MISCELLANEOUS) ×2 IMPLANT
COVER BACK TABLE 60X90IN (DRAPES) ×2 IMPLANT
DRAPE LG THREE QUARTER DISP (DRAPES) ×2 IMPLANT
DRAPE UNDERBUTTOCKS STRL (DRAPE) ×2 IMPLANT
DRSG TELFA 3X8 NADH (GAUZE/BANDAGES/DRESSINGS) ×2 IMPLANT
ELECT REM PT RETURN 9FT ADLT (ELECTROSURGICAL) ×2
ELECTRODE REM PT RTRN 9FT ADLT (ELECTROSURGICAL) ×1 IMPLANT
GLOVE BIO SURGEON STRL SZ 6.5 (GLOVE) ×2 IMPLANT
GLOVE BIO SURGEON STRL SZ7.5 (GLOVE) ×4 IMPLANT
GLOVE ECLIPSE 7.5 STRL STRAW (GLOVE) ×2 IMPLANT
GLOVE INDICATOR 6.5 STRL GRN (GLOVE) ×2 IMPLANT
GOWN STRL REUS W/ TWL LRG LVL3 (GOWN DISPOSABLE) ×1 IMPLANT
GOWN STRL REUS W/ TWL XL LVL3 (GOWN DISPOSABLE) ×1 IMPLANT
GOWN STRL REUS W/TWL LRG LVL3 (GOWN DISPOSABLE) ×1
GOWN STRL REUS W/TWL XL LVL3 (GOWN DISPOSABLE) ×1
LEGGING LITHOTOMY PAIR STRL (DRAPES) ×2 IMPLANT
NEEDLE HYPO 25X1 1.5 SAFETY (NEEDLE) ×2 IMPLANT
NS IRRIG 500ML POUR BTL (IV SOLUTION) ×2 IMPLANT
PACK BASIN DAY SURGERY FS (CUSTOM PROCEDURE TRAY) ×2 IMPLANT
PAD OB MATERNITY 4.3X12.25 (PERSONAL CARE ITEMS) ×2 IMPLANT
PAD PREP 24X48 CUFFED NSTRL (MISCELLANEOUS) ×2 IMPLANT
PENCIL BUTTON HOLSTER BLD 10FT (ELECTRODE) ×2 IMPLANT
SCOPETTES 8  STERILE (MISCELLANEOUS) ×1
SCOPETTES 8 STERILE (MISCELLANEOUS) ×1 IMPLANT
SPONGE LAP 18X18 X RAY DECT (DISPOSABLE) ×2 IMPLANT
SUT VIC AB 3-0 SH 27 (SUTURE) ×1
SUT VIC AB 3-0 SH 27X BRD (SUTURE) ×1 IMPLANT
TOWEL OR 17X24 6PK STRL BLUE (TOWEL DISPOSABLE) ×4 IMPLANT
TRAY DSU PREP LF (CUSTOM PROCEDURE TRAY) ×2 IMPLANT
TUBE CONNECTING 12X1/4 (SUCTIONS) ×2 IMPLANT
WATER STERILE IRR 500ML POUR (IV SOLUTION) ×2 IMPLANT
YANKAUER SUCT BULB TIP NO VENT (SUCTIONS) ×2 IMPLANT

## 2015-01-10 NOTE — Anesthesia Postprocedure Evaluation (Signed)
Anesthesia Post Note  Patient: [name]  Procedure(s) Performed: Procedure(s) (LRB): RIGHT WIDE LOCAL EXCISION VULVECTOMY (Right)  Anesthesia type: general  Patient location: PACU  Post pain: Pain level controlled  Post assessment: Patient's Cardiovascular Status Stable  Last Vitals:  Filed Vitals:   01/10/15 0845  BP: 124/72  Pulse: 55  Temp:   Resp: 14    Post vital signs: Reviewed and stable  Level of consciousness: sedated  Complications: No apparent anesthesia complications

## 2015-01-10 NOTE — Anesthesia Preprocedure Evaluation (Addendum)
Anesthesia Evaluation  Patient identified by MRN, date of birth, ID band Patient awake    Reviewed: Allergy & Precautions, NPO status , Patient's Chart, lab work & pertinent test results  Airway Mallampati: I  TM Distance: >3 FB Neck ROM: Full    Dental  (+) Teeth Intact, Dental Advisory Given   Pulmonary former smoker,    Pulmonary exam normal       Cardiovascular Exercise Tolerance: Good hypertension, Pt. on medications and Pt. on home beta blockers Normal cardiovascular examRhythm:Regular Rate:Bradycardia  10-Jan-2015  Sinus bradycardia Otherwise normal ECG   Neuro/Psych  Headaches,    GI/Hepatic GERD-  Medicated and Controlled,  Endo/Other    Renal/GU      Musculoskeletal  (+) Arthritis -,   Abdominal   Peds  Hematology  (+) anemia ,   Anesthesia Other Findings   Reproductive/Obstetrics Vulvar mass                      Anesthesia Physical Anesthesia Plan  ASA: II  Anesthesia Plan: General   Post-op Pain Management:    Induction: Intravenous  Airway Management Planned: LMA  Additional Equipment:   Intra-op Plan:   Post-operative Plan: Extubation in OR  Informed Consent: I have reviewed the patients History and Physical, chart, labs and discussed the procedure including the risks, benefits and alternatives for the proposed anesthesia with the patient or authorized representative who has indicated his/her understanding and acceptance.   Dental advisory given  Plan Discussed with: CRNA, Surgeon and Anesthesiologist  Anesthesia Plan Comments:        Anesthesia Quick Evaluation

## 2015-01-10 NOTE — Transfer of Care (Signed)
Immediate Anesthesia Transfer of Care Note  Patient: [name]  Procedure(s) Performed: Procedure(s): RIGHT WIDE LOCAL EXCISION VULVECTOMY (Right)  Patient Location: PACU  Anesthesia Type:General  Level of Consciousness: awake, alert , oriented and patient cooperative  Airway & Oxygen Therapy: Patient Spontanous Breathing and Patient connected to nasal cannula oxygen  Post-op Assessment: Report given to RN and Post -op Vital signs reviewed and stable  Post vital signs: Reviewed and stable  Last Vitals:  Filed Vitals:   01/10/15 0635  BP: 131/72  Pulse: 50  Temp: 36.9 C  Resp: 16    Complications: No apparent anesthesia complications

## 2015-01-10 NOTE — Interval H&P Note (Signed)
History and Physical Interval Note:  01/10/2015 7:42 AM  [name]  has presented today for surgery, with the diagnosis of PAGET DISEASE OF THE VULVA  The various methods of treatment have been discussed with the patient and family. After consideration of risks, benefits and other options for treatment, the patient has consented to  Procedure(s): RIGHT WIDE LOCAL EXCISION VULVECTOMY (Right) as a surgical intervention .  The patient's history has been reviewed, patient examined, no change in status, stable for surgery.  I have reviewed the patient's chart and labs.  Questions were answered to the patient's satisfaction.     CLARKE-PEARSON,Alexxus Sobh L

## 2015-01-10 NOTE — Op Note (Signed)
[name]  female MEDICAL RECORD [id number] DATE OF BIRTH: [birth year] PHYSICIAN: [name], M.D  01/10/2015   OPERATIVE REPORT  PREOPERATIVE DIAGNOSIS:   POSTOPERATIVE DIAGNOSIS:  PROCEDURE:   SURGEON: [name], M. ANESTHESIA: LMA and local ESTIMATED BLOOD LOSS: Minimal  SURGICAL FINDINGS: Examination under anesthesia revealed that the right labia minora had been previously removed. There was approximate 3 cm area of raised erythematous skin which had previously been biopsied and was consistent with Paget's disease.  PROCEDURE: The patient brought to the operating room and after satisfactory attainment of general anesthesia was placed in a modified lithotomy position in [place]. The perineum vagina and vulva were prepped with Betadine. The patient was draped. Surgical timeout was taken.  The vulvar lesion noted above was identified and an elliptical incision was made to remove the entire visible lesion. The subcutaneous tissue was dissected with the Bovie. Hemostasis was achieved with cautery. The skin margins were reapproximated without tension using 30 Vicryl in a subcuticular fashion.  The patient was awakened from anesthesia and taken to the recovery room in satisfactory condition. Sponge needle and isthmic counts correct 2.     [name], M.D

## 2015-01-10 NOTE — Anesthesia Procedure Notes (Signed)
Procedure Name: LMA Insertion Date/Time: 01/10/2015 7:54 AM Performed by: Wanita Chamberlain Pre-anesthesia Checklist: Patient identified, Emergency Drugs available, Suction available, Patient being monitored and Timeout performed Patient Re-evaluated:Patient Re-evaluated prior to inductionOxygen Delivery Method: Circle system utilized Preoxygenation: Pre-oxygenation with 100% oxygen Intubation Type: IV induction Ventilation: Mask ventilation without difficulty LMA: LMA inserted LMA Size: 4.0 Number of attempts: 1 Airway Equipment and Method: Bite block Placement Confirmation: breath sounds checked- equal and bilateral and positive ETCO2 Tube secured with: Tape Dental Injury: Teeth and Oropharynx as per pre-operative assessment

## 2015-01-10 NOTE — Discharge Instructions (Addendum)
Vulvectomy, Care After °The vulva is the external female genitalia, outside and around the vagina and pubic bone. It consists of: °· The skin on, and in front of, the pubic bone. °· The clitoris. °· The labia majora (large lips) on the outside of the vagina. °· The labia minora (small lips) around the opening of the vagina. °· The opening and the skin in and around the vagina. °A vulvectomy is the removal of the tissue of the vulva, which sometimes includes removal of the lymph nodes and tissue in the groin areas. °These discharge instructions provide you with general information on caring for yourself after you leave the hospital. It is also important that you know the warning signs of complications, so that you can seek treatment. Please read the instructions outlined below and refer to this sheet in the next few weeks. Your caregiver may also give you specific information and medicines. If you have any questions or complications after discharge, please call your caregiver. °ACTIVITY °· Rest as much as possible the first two weeks after discharge. °· Arrange to have help from family or others with your daily activities when you go home. °· Avoid heavy lifting (more than 5 pounds), pushing, or pulling. °· If you feel tired, balance your activity with rest periods. °· Follow your caregiver's instruction about climbing stairs and driving a car. °· Increase activity gradually. °· Do not exercise until you have permission from your caregiver. °LEG AND FOOT CARE °If your doctor has removed lymph nodes from your groin area, there may be an increase in swelling of your legs and feet. You can help prevent swelling by doing the following: °· Elevate your legs while sitting or lying down. °· If your caregiver has ordered special stockings, wear them according to instructions. °· Avoid standing in one place for long periods of time. °· Call the physical therapy department if you have any questions about swelling or treatment  for swelling. °· Avoid salt in your diet. It can cause fluid retention and swelling. °· Do not cross your legs, especially when sitting. °NUTRITION °· You may resume your normal diet. °· Drink 6 to 8 glasses of fluids a day. °· Eat a healthy, balanced diet including portions of food from the meat (protein), milk, fruit, vegetable, and bread groups. °· Your caregiver may recommend you take a multivitamin with iron. °ELIMINATION °· You may notice that your stream of urine is at a different angle, and may tend to spray. Using a plastic funnel may help to decrease urine spray. °· If constipation occurs, drink more liquids, and add more fruits, vegetables, and bran to your diet. You may take a mild laxative, such as Milk of Magnesia, Metamucil, or a stool softener such as Colace, with permission from your caregiver. °HYGIENE °· You may shower and wash your hair. °· Check with your caregiver about tub baths. °· Do not add any bath oils or chemicals to your bath water, after you have permission to take baths. °· Clean yourself well after moving your bowels. °· After urinating, wipe from top to bottom. °· A sitz bath will help keep your perineal area clean, reduce swelling, and provide comfort. °HOME CARE INSTRUCTIONS  °· Take your temperature twice a day and record it, especially if you feel feverish or have chills. °· Follow your caregiver's instructions about medicines, activity, and follow-up appointments after surgery. °· Do not drink alcohol while taking pain medicine. °· Change your dressing as advised by your caregiver. °·   your caregiver.  °You may take over-the-counter medicine for pain, recommended by your caregiver.  °If your pain is not relieved with medicine, call your caregiver.  °Do not take aspirin because it can cause bleeding.  °Do not douche or use tampons (use a nonperfumed sanitary pad).  °Do not have sexual intercourse until your caregiver gives you permission. Hugging, kissing, and playful sexual activity is fine with your caregiver's  permission.  °Warm sitz baths, with your caregiver's permission, are helpful to control swelling and discomfort.  °Take showers instead of baths, until your caregiver gives you permission to take baths.  °You may take a mild medicine for constipation, recommended by your caregiver. Bran foods and drinking a lot of fluids will help with constipation.  °Make sure your family understands everything about your operation and recovery. °SEEK MEDICAL CARE IF:  °You notice swelling and redness around the wound area.  °You notice a foul smell coming from the wound or on the surgical dressing.  °You notice the wound is separating.  °You have painful or bloody urination.  °You develop nausea and vomiting.  °You develop diarrhea.  °You develop a rash.  °You have a reaction or allergy from the medicine.  °You feel dizzy or light-headed.  °You need stronger pain medicine. °SEEK IMMEDIATE MEDICAL CARE IF:  °You develop a temperature of 102° F (38.9° C) or higher.  °You pass out.  °You develop leg or chest pain.  °You develop abdominal pain.  °You develop shortness of breath.  °You develop bleeding from the wound area.  °You see pus in the wound area. °MAKE SURE YOU:  °Understand these instructions.  °Will watch your condition.  °Will get help right away if you are not doing well or get worse. °Document Released: 01/06/2004 Document Revised: 10/08/2013 Document Reviewed: 04/25/2009  °[employer]® Patient Information ©2015 [employer]. This information is not intended to replace advice given to you by your health care provider. Make sure you discuss any questions you have with your health care provider.  ° ° °Post Anesthesia Home Care Instructions ° °Activity: °Get plenty of rest for the remainder of the day. A responsible adult should stay with you for 24 hours following the procedure.  °For the next 24 hours, DO NOT: °-Drive a car °-Operate machinery °-Drink alcoholic beverages °-Take any medication unless instructed by your  physician °-Make any legal decisions or sign important papers. ° °Meals: °Start with liquid foods such as gelatin or soup. Progress to regular foods as tolerated. Avoid greasy, spicy, heavy foods. If nausea and/or vomiting occur, drink only clear liquids until the nausea and/or vomiting subsides. Call your physician if vomiting continues. ° °Special Instructions/Symptoms: °Your throat may feel dry or sore from the anesthesia or the breathing tube placed in your throat during surgery. If this causes discomfort, gargle with warm salt water. The discomfort should disappear within 24 hours. ° °If you had a scopolamine patch placed behind your ear for the management of post- operative nausea and/or vomiting: ° °1. The medication in the patch is effective for 72 hours, after which it should be removed.  Wrap patch in a tissue and discard in the trash. Wash hands thoroughly with soap and water. °2. You may remove the patch earlier than 72 hours if you experience unpleasant side effects which may include dry mouth, dizziness or visual disturbances. °3. Avoid touching the patch. Wash your hands with soap and water after contact with the patch. °  ° ° ° °

## 2015-01-10 NOTE — H&P (View-Only) (Signed)
Consult Note: Gyn-Onc   [name] 64 y.o. female  Chief Complaint  Patient presents with  . paget's disease of vulva    follow-up    Assessment : Paget's disease of the vulva with suspicious area on the right anterior vulva. Pending biopsy we may need to perform wide local excision.  Plan: We will contact patient with pathology report and range wide local excision if necessary.   Interval History: Patient returns today as previously scheduled. Since her last visit she's done well. She continues to have intermittent pruritus of the right anterior vulva.  HPI:The patient initially underwent surgery for Paget's disease of the vulva in October 2011. There was focal involvement of the medial margin. The entire lesion is approximately 5 cm in diameter.   The patient previously had a supracervical hysterectomy but has her ovaries and cervix in situ.  She notes that she had some repair done at the time of her hysterectomy and at her introitus is tight and she has difficulty having intercourse and also has tearing. On the other hand her husband has had prostate cancer surgery making intercourse difficult as well.     Review of Systems:10 point review of systems is negative except as noted in interval history.   Vitals: Blood pressure 122/73, pulse 58, temperature 98.1 F (36.7 C), temperature source Oral, resp. rate 20, height 5\' 4"  (1.626 m), weight 162 lb (73.483 kg), SpO2 100 %.  Physical Exam: General : The patient is a healthy woman in no acute distress.  HEENT: normocephalic, extraoccular movements normal; neck is supple without thyromegally  Lynphnodes: Supraclavicular and inguinal nodes not enlarged  Abdomen: Soft, non-tender, no ascites, no organomegally, no masses, no hernias  Pelvic:  EGBUS: Normal female status post partial vulvectomy with flattening of the right labia minora. There is an area approximately 3 cm in diameter in the right anterior vulva that is more  erythematous than the remainder the skin and a suspicious for recurrent Paget's disease. Vagina: Atrophic, no lesions  Urethra and Bladder: Normal, non-tender  Cervix: Atrophic Uterus: Surgically absent  Bi-manual examination: Non-tender; no adenxal masses or nodularity  Rectal: normal sphincter tone, no masses, no blood  Lower extremities: No edema or varicosities. Normal range of motion      Procedure note after verbal informed consent and timeout the vulva was prepped with Betadine and 1% Xylocaine is injected. A punch biopsy is obtained hemostasis achieved with silver nitrate. Specimen was submitted to pathology.    Allergies  Allergen Reactions  . Ibuprofen Hypertension  . Naprosyn [Naproxen] Nausea Only  . Other     Allergic to toothpaste- makes mouth peel Hay fever/dust mites/trees "365 allergic"   . Azithromycin Itching and Rash  . Sulfa Antibiotics Hives and Rash    Past Medical History  Diagnosis Date  . 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 S/P partial hysterectomy   . Hypertension   . Headache(784.0)     Migraines  . GERD (gastroesophageal reflux disease)   . GERD (gastroesophageal reflux disease)   . Arthritis   . Paget's disease of vulva   . Multiple allergies   . Vulvar mass 04/16/2011    Past Surgical History  Procedure Laterality Date  . Anterior and posterior repair  1989  . Simple vulvectomy  2011    right side  . Tonsilectomy, adenoidectomy, bilateral myringotomy and tubes    . Wisdom tooth extraction    . Hand surgery  2012    Left hand, ring finger cyst removal, trigger finger surg x 3 fingers  . Vaginal hysterectomy  1989    for prolapse  . Trigger finger surgery      Current Outpatient Prescriptions  Medication Sig Dispense Refill  . acetaminophen (TYLENOL) 650 MG CR tablet Take 650 mg by mouth every 4 (four) hours as needed. For pain/fever    . clobetasol ointment (TEMOVATE) 2.39 % Apply 1 application topically 2 (two)  times daily as needed. 15 g 2  . fluticasone (FLONASE) 50 MCG/ACT nasal spray Place 2 sprays into the nose daily as needed.     . lansoprazole (PREVACID) 30 MG capsule Take 60 mg by mouth 2 (two) times daily.     Marland Kitchen losartan-hydrochlorothiazide (HYZAAR) 100-25 MG per tablet TAKE 1 TABLET DAILY 90 tablet 1  . MELATONIN PO Take 1 tablet by mouth at bedtime.    . metoprolol tartrate (LOPRESSOR) 25 MG tablet Take 0.5 tablets (12.5 mg total) by mouth 2 (two) times daily. 90 tablet 0  . montelukast (SINGULAIR) 10 MG tablet TAKE 1 TABLET EVERY MORNING 90 tablet 1  . albuterol (PROVENTIL HFA;VENTOLIN HFA) 108 (90 BASE) MCG/ACT inhaler Inhale 2 puffs into the lungs every 6 (six) hours as needed. For wheezing    . beclomethasone (QVAR) 80 MCG/ACT inhaler Inhale 1 puff into the lungs daily.     No current facility-administered medications for this visit.    History   Social History  . Marital Status: Married    Spouse Name: N/A  . Number of Children: N/A  . Years of Education: N/A   Occupational History  . Not on file.   Social History Main Topics  . Smoking status: Former Smoker    Quit date: 04/16/1991  . Smokeless tobacco: Never Used  . Alcohol Use: 0.6 oz/week    1 Glasses of wine per week     Comment: occasional wine  . Drug Use: No  . Sexual Activity: Not on file   Other Topics Concern  . Not on file   Social History Narrative    Family History  Problem Relation Age of Onset  . Cancer Maternal Grandmother   . Hypertension Father   . Heart disease Father   . Hypertension Mother   . Heart disease Mother   . Cancer Paternal Grandmother     breast cancer  . Colon cancer Paternal Pasty Spillers, MD 12/20/2014, 3:37 PM

## 2015-01-10 NOTE — Progress Notes (Signed)
Received phone call from the [facility] stating the patient was having some moderate pain after surgery.  Patient stating she has taken hydrocodone in the past and tolerated it well.  Requesting a script to have over the weekend in case of moderate to severe post op pain of the vulva.  Script written for hydrocodone/APAP, #30, 0 refills.

## 2015-01-13 ENCOUNTER — Telehealth: Payer: Self-pay

## 2015-01-13 ENCOUNTER — Encounter (HOSPITAL_BASED_OUTPATIENT_CLINIC_OR_DEPARTMENT_OTHER): Payer: Self-pay | Admitting: Gynecology

## 2015-01-13 NOTE — Telephone Encounter (Signed)
PLEASE NOTE: All timestamps contained within this report are represented as [place] Standard Time. CONFIDENTIALTY NOTICE: This fax transmission is intended only for the addressee. It contains information that is legally privileged, confidential or otherwise protected from use or disclosure. If you are not the intended recipient, you are strictly prohibited from reviewing, disclosing, copying using or disseminating any of this information or taking any action in reliance on or regarding this information. If you have received this fax in error, please notify us immediately by telephone so that we can arrange for its return to [place]. Phone: [phone], Toll-Free: [phone], Fax: [phone] Page: 1 of 1 Call Id: [id number] [place] Patient Name: [name] Gender: Female DOB: 31-Aug-1950 Age: 64 Y 11 M 29 D Return Phone Number: [phone] (Primary), [phone] (Secondary) Address: City/State/Zip: [place] Client [place] Night - Client Client Site [place] [facility] [place], [place] Contact Type Call Call Type Triage / Clinical Relationship To Patient Self Return Phone Number [phone] (Primary) Chief Complaint Vomiting Initial Comment Caller states After surgery today she has a headache and she is vomiting. Nurse Assessment Nurse: Barbera Setters, RN, Clarene Critchley Date/Time (Eastern Time): 01/10/2015 11:30:15 PM Confirm and document reason for call. If symptomatic, describe symptoms. ---Caller states After surgery today she has a headache and she is vomiting. Has the patient traveled out of the country within the last 30 days? ---Not Applicable Does the patient require triage? ---No Please document clinical information provided and list any resource used. ---I recommended caller to call and update the surgeon on call. Guidelines Guideline Title  Affirmed Question Affirmed Notes Nurse Date/Time (Eastern Time) Disp. Time Eilene Ghazi Time) Disposition Final User 01/10/2015 11:34:17 PM Clinical Call Yes Pringle, RN, Clarene Critchley After Care Instructions Given Call Event Type User Date / Time Description

## 2015-01-14 ENCOUNTER — Telehealth: Payer: Self-pay | Admitting: Nurse Practitioner

## 2015-01-14 NOTE — Telephone Encounter (Signed)
Please call pt , how is she feeling? What did surgeon recommend?

## 2015-01-14 NOTE — Telephone Encounter (Signed)
Per [name], NP, Rn called patient to inform her pathology shows known paget's disease but no cancer is identified. She verbalizes understanding and thanks for good news. She informs she has mild nausea that she feels is improving. RN encouraged her to call if symptoms worsen or she has emesis. She verbalizes she will do so.

## 2015-01-14 NOTE — Telephone Encounter (Signed)
Nausea much better, not having to take as many pain pills.  Dr. Zenaida Niece nurse called today with a report of no cancer which was good.  Patient was not happy with the [facility] Nurse who offered no advice other than to contact her surgeon and the patient had already tried to contact them with no response prior to calling [facility].

## 2015-01-15 NOTE — Telephone Encounter (Signed)
Noted  

## 2015-01-21 ENCOUNTER — Telehealth: Payer: Self-pay | Admitting: Gynecologic Oncology

## 2015-01-21 NOTE — Telephone Encounter (Signed)
Patient called stating she feels like her paget's disease of the vulva is still there after having a WLE with Dr. Fermin Schwab.  "I know that feeling and I think it is still there."  Advised she could come in to see Dr. Denman George this Friday or she could keep her appt with Dr. Fermin Schwab.  She will call the office if her symptoms worsen and then we will place her on Friday to see Dr. Denman George.  Advised to call for any questions or concerns as well.

## 2015-01-24 ENCOUNTER — Telehealth: Payer: Self-pay | Admitting: Gynecologic Oncology

## 2015-01-24 NOTE — Telephone Encounter (Signed)
Patient called and left message stating she did not need to be seen today and would keep her appt with Dr. Fermin Schwab next week.

## 2015-01-31 ENCOUNTER — Ambulatory Visit: Payer: BLUE CROSS/BLUE SHIELD | Attending: Gynecology | Admitting: Gynecology

## 2015-01-31 ENCOUNTER — Encounter: Payer: Self-pay | Admitting: Gynecology

## 2015-01-31 VITALS — BP 141/76 | HR 51 | Temp 98.2°F | Resp 18 | Ht 64.0 in | Wt 162.4 lb

## 2015-01-31 DIAGNOSIS — L292 Pruritus vulvae: Secondary | ICD-10-CM

## 2015-01-31 DIAGNOSIS — C4499 Other specified malignant neoplasm of skin, unspecified: Secondary | ICD-10-CM

## 2015-01-31 DIAGNOSIS — C519 Malignant neoplasm of vulva, unspecified: Secondary | ICD-10-CM

## 2015-01-31 MED ORDER — CLOBETASOL PROPIONATE 0.05 % EX OINT: 1.0000 "application " | TOPICAL_OINTMENT | Freq: Two times a day (BID) | CUTANEOUS | Status: DC | PRN

## 2015-01-31 NOTE — Progress Notes (Signed)
Consult Note: Gyn-Onc   [name] 64 y.o. female  Chief Complaint  Patient presents with  . Paget's disease of vulva    post-op check    Assessment : Paget's disease of the vulva having undergone recent wide local excision. There is a suspicious area on the right anterior vulva overlying some scarring from recent surgery.. Persistent pruritus.  Plan: While the patient may well have persistent Paget's disease, she is still recovering from surgery and certainly some of the thickening beneath the upper incision is surgical recovery.  I recommend the patient use tea sitz baths twice a day and then apply clobetasol. She return to see me in 4 weeks for reassessment.  Interval History: Patient returns today because of persistent symptoms of pruritus on her right vulva. She underwent wide local excision of the lesion on August 5. No invasive carcinoma was identified although there were some focal margin involvement from 12 to 3:00 and 9 to 12:00. At the time of surgery I excised all obvious lesion. Initially she's had an uncomplicated postoperative course.  HPI:The patient initially underwent surgery for Paget's disease of the vulva in October 2011. There was focal involvement of the medial margin. The entire lesion is approximately 5 cm in diameter.   The patient previously had a supracervical hysterectomy but has her ovaries and cervix in situ.  She notes that she had some repair done at the time of her hysterectomy and at her introitus is tight and she has difficulty having intercourse and also has tearing. On the other hand her husband has had prostate cancer surgery making intercourse difficult as well.  Patient underwent wide local excision of a recurrent lesion on August 50,016 while all gross lesion was removed she did have positive surgical margins that were focal     Review of Systems:10 point review of systems is negative except as noted in interval history.   Vitals: Blood  pressure 141/76, pulse 51, temperature 98.2 F (36.8 C), temperature source Oral, resp. rate 18, height 5\' 4"  (1.626 m), weight 162 lb 6.4 oz (73.664 kg), SpO2 100 %.  Physical Exam: General : The patient is a healthy woman in no acute distress.  HEENT: normocephalic, extraoccular movements normal; neck is supple without thyromegally  Lynphnodes: Supraclavicular and inguinal nodes not enlarged  Abdomen: Soft, non-tender, no ascites, no organomegally, no masses, no hernias  Pelvic:  EGBUS: Normal female status post partial vulvectomy with flattening of the right labia minora. There is an area  overlying the upper aspect of the incision on the right vulva which is somewhat erythematous. Palpation in this area also reveals thickening consistent with postoperative scarring and healing. There is no evidence of infection.  Vagina: Atrophic, no lesions  Urethra and Bladder: Normal, non-tender  Cervix: Atrophic Uterus: Surgically absent  Bi-manual examination: Non-tender; no adenxal masses or nodularity  Rectal: normal sphincter tone, no masses, no blood  Lower extremities: No edema or varicosities. Normal range of motion      Procedure note after verbal informed consent and timeout the vulva was prepped with Betadine and 1% Xylocaine is injected. A punch biopsy is obtained hemostasis achieved with silver nitrate. Specimen was submitted to pathology.    Allergies  Allergen Reactions  . Ibuprofen Hypertension  . Naprosyn [Naproxen] Nausea Only  . Other     Allergic to toothpaste- makes mouth peel Hay fever/dust mites/trees "365 allergic"   . Azithromycin Itching and Rash  . Sulfa Antibiotics Hives and Rash  Past Medical History  Diagnosis Date  . Hypertension   . Headache(784.0)     Migraines  . Arthritis   . Paget's disease of vulva   . GERD (gastroesophageal reflux disease)   . History of adenomatous polyp of colon   . History of palpitations   . Allergic rhinitis   .  History of concussion     AS CHILD--  NO RESIDUAL  . Wears glasses     Past Surgical History  Procedure Laterality Date  . Simple vulvectomy  03-24-2010    right side  . Wisdom tooth extraction    . Pulley release right thumb  03-23-2011  . Pullery release left thumb, left ring finger/  excision mucoid tumor and debridement left ring finger joint  02-17-2011  . Vaginal hysterectomy  1989    and Anterior and posterior repair's for prolapse  . Tonsillectomy  1977  . Vulvectomy Right 01/10/2015    Procedure: RIGHT WIDE LOCAL EXCISION VULVECTOMY;  Surgeon: [name], MD;  Location: [facility];  Service: Gynecology;  Laterality: Right;    Current Outpatient Prescriptions  Medication Sig Dispense Refill  . albuterol (PROVENTIL HFA;VENTOLIN HFA) 108 (90 BASE) MCG/ACT inhaler Inhale 2 puffs into the lungs every 6 (six) hours as needed. For wheezing    . clobetasol ointment (TEMOVATE) 1.61 % Apply 1 application topically 2 (two) times daily as needed. 15 g 2  . fluticasone (FLONASE) 50 MCG/ACT nasal spray Place 2 sprays into the nose daily as needed.     . lansoprazole (PREVACID) 30 MG capsule Take 30 mg by mouth 2 (two) times daily.     Marland Kitchen losartan-hydrochlorothiazide (HYZAAR) 100-25 MG per tablet TAKE 1 TABLET DAILY (Patient taking differently: TAKE 1 TABLET DAILY--  TAKES IN AM) 90 tablet 0  . metoprolol tartrate (LOPRESSOR) 25 MG tablet Take 0.5 tablets (12.5 mg total) by mouth 2 (two) times daily. (Patient taking differently: Take 12.5 mg by mouth 2 (two) times daily. ) 90 tablet 0  . montelukast (SINGULAIR) 10 MG tablet TAKE 1 TABLET EVERY MORNING 90 tablet 0  . fluticasone (CUTIVATE) 0.05 % cream Apply topically as needed.    Marland Kitchen HYDROcodone-acetaminophen (NORCO/VICODIN) 5-325 MG per tablet Take 1 tablet by mouth every 6 (six) hours as needed for moderate pain. (Patient not taking: Reported on 01/31/2015) 30 tablet 0  . MELATONIN PO Take 1 tablet by mouth at bedtime  as needed.      No current facility-administered medications for this visit.    Social History   Social History  . Marital Status: Married    Spouse Name: N/A  . Number of Children: N/A  . Years of Education: N/A   Occupational History  . Not on file.   Social History Main Topics  . Smoking status: Former Smoker -- 5 years    Types: Cigarettes    Quit date: 04/15/1989  . Smokeless tobacco: Never Used  . Alcohol Use: 0.6 oz/week    1 Glasses of wine per week     Comment: occasional wine  . Drug Use: No  . Sexual Activity: Not on file   Other Topics Concern  . Not on file   Social History Narrative    Family History  Problem Relation Age of Onset  . Cancer Maternal Grandmother   . Hypertension Father   . Heart disease Father   . Hypertension Mother   . Heart disease Mother   . Cancer Paternal Grandmother     breast  cancer  . Colon cancer Paternal Pasty Spillers, MD 01/31/2015, 12:48 PM

## 2015-01-31 NOTE — Patient Instructions (Signed)
Perform sitz baths several times a day and use tea bags in the water or place directly on the vulva after wet.  After your sitz bath, you can use the clobetasol cream, up to several times daily.  Please call at the end of next week with an update to see if your leave from surgery needs to be extended.  Please call for any questions or concerns and plan to follow up with Dr. Fermin Schwab on Sept 16.

## 2015-01-31 NOTE — Addendum Note (Signed)
Addended by: [name] D on: 01/31/2015 12:56 PM   Modules accepted: Orders

## 2015-02-06 ENCOUNTER — Encounter: Payer: Self-pay | Admitting: Gynecologic Oncology

## 2015-02-06 ENCOUNTER — Telehealth: Payer: Self-pay | Admitting: Gynecologic Oncology

## 2015-02-06 NOTE — Telephone Encounter (Signed)
Patient called stating she does not feel she can return to work on Monday due to severe vulvar itching and she states she has to use frequent sitz bath and warm showers along with clobetasol application to ease the itching.  The interventions offer some relief but the itching is still present.  Patient wanting to wait to see Dr. Judeth Porch on Sept 16 before returning to work because she feels she still has Pagets and will need to have an additional surgery.  Letter written to send to [place].  Patient to call for any questions or concerns.

## 2015-02-21 ENCOUNTER — Ambulatory Visit: Payer: BLUE CROSS/BLUE SHIELD | Attending: Gynecology | Admitting: Gynecology

## 2015-02-21 ENCOUNTER — Encounter: Payer: Self-pay | Admitting: Gynecology

## 2015-02-21 VITALS — BP 137/76 | HR 60 | Temp 98.2°F | Resp 18 | Ht 64.0 in | Wt 164.5 lb

## 2015-02-21 DIAGNOSIS — C4499 Other specified malignant neoplasm of skin, unspecified: Secondary | ICD-10-CM | POA: Insufficient documentation

## 2015-02-21 DIAGNOSIS — C519 Malignant neoplasm of vulva, unspecified: Secondary | ICD-10-CM

## 2015-02-21 NOTE — Patient Instructions (Signed)
Plan for wide local excision of the vulva with Dr. Fermin Schwab the morning of October 14 at [facility].  You will receive a phone call several days before the procedure to discuss instructions.  Please mention the nausea/vomiting with anesthesia prior to being put to sleep.  Please call for any questions or concerns.

## 2015-02-21 NOTE — Progress Notes (Signed)
Consult Note: Gyn-Onc   [name] 64 y.o. female  Chief Complaint  Patient presents with  . pagets disease of vulva    Assessment : Paget's disease of the vulva having undergone recent wide local excision. There is a suspicious area on the right anterior vulva just above my prior incision measuring approximately 8 mm.   Plan:  We will schedule excision of the lesion as an outpatient on 03/21/2015.  Interval History:  Since her last visit 4 weeks ago the patient has much improved in her symptoms. The thickening beneath the prior scar has resolved. However, the patient has some persistent pruritus and discomfort at the just above the apex of my prior linear incision.Marland Kitchen  HPI:The patient initially underwent surgery for Paget's disease of the vulva in October 2011. There was focal involvement of the medial margin. The entire lesion is approximately 5 cm in diameter.   The patient previously had a supracervical hysterectomy but has her ovaries and cervix in situ.  She notes that she had some repair done at the time of her hysterectomy and at her introitus is tight and she has difficulty having intercourse and also has tearing. On the other hand her husband has had prostate cancer surgery making intercourse difficult as well.  Patient underwent wide local excision of a recurrent lesion on August 50,016 while all gross lesion was removed she did have positive surgical margins that were focal     Review of Systems:10 point review of systems is negative except as noted in interval history.   Vitals: Blood pressure 137/76, pulse 60, temperature 98.2 F (36.8 C), temperature source Oral, resp. rate 18, height 5\' 4"  (1.626 m), weight 164 lb 8 oz (74.617 kg), SpO2 98 %.  Physical Exam: General : The patient is a healthy woman in no acute distress.  HEENT: normocephalic, extraoccular movements normal; neck is supple without thyromegally  Lynphnodes: Supraclavicular and inguinal nodes not  enlarged  Abdomen: Soft, non-tender, no ascites, no organomegally, no masses, no hernias  Pelvic:  EGBUS: Normal female status post partial vulvectomy with flattening of the right labia minora. There is an 8 mm area just above the upper aspect of the incision on the right vulva which is somewhat erythematous.  .  Vagina: Atrophic, no lesions  Urethra and Bladder: Normal, non-tender  Cervix: Atrophic Uterus: Surgically absent  Bi-manual examination: Non-tender; no adenxal masses or nodularity  Rectal: normal sphincter tone, no masses, no blood  Lower extremities: No edema or varicosities. Normal range of motion          Allergies  Allergen Reactions  . Ibuprofen Hypertension  . Naprosyn [Naproxen] Nausea Only  . Other     Allergic to toothpaste- makes mouth peel Hay fever/dust mites/trees "365 allergic"   . Azithromycin Itching and Rash  . Sulfa Antibiotics Hives and Rash    Past Medical History  Diagnosis Date  . Hypertension   . Headache(784.0)     Migraines  . Arthritis   . Paget's disease of vulva   . GERD (gastroesophageal reflux disease)   . History of adenomatous polyp of colon   . History of palpitations   . Allergic rhinitis   . History of concussion     AS CHILD--  NO RESIDUAL  . Wears glasses     Past Surgical History  Procedure Laterality Date  . Simple vulvectomy  03-24-2010    right side  . Wisdom tooth extraction    . Pulley release right thumb  03-23-2011  . Pullery release left thumb, left ring finger/  excision mucoid tumor and debridement left ring finger joint  02-17-2011  . Vaginal hysterectomy  1989    and Anterior and posterior repair's for prolapse  . Tonsillectomy  1977  . Vulvectomy Right 01/10/2015    Procedure: RIGHT WIDE LOCAL EXCISION VULVECTOMY;  Surgeon: [name], MD;  Location: [facility];  Service: Gynecology;  Laterality: Right;    Current Outpatient Prescriptions  Medication Sig Dispense Refill   . albuterol (PROVENTIL HFA;VENTOLIN HFA) 108 (90 BASE) MCG/ACT inhaler Inhale 2 puffs into the lungs every 6 (six) hours as needed. For wheezing    . clobetasol ointment (TEMOVATE) 6.14 % Apply 1 application topically 2 (two) times daily as needed. 15 g 2  . fluticasone (CUTIVATE) 0.05 % cream Apply topically as needed.    . fluticasone (FLONASE) 50 MCG/ACT nasal spray Place 2 sprays into the nose daily as needed.     Marland Kitchen HYDROcodone-acetaminophen (NORCO/VICODIN) 5-325 MG per tablet Take 1 tablet by mouth every 6 (six) hours as needed for moderate pain. 30 tablet 0  . lansoprazole (PREVACID) 30 MG capsule Take 30 mg by mouth 2 (two) times daily.     Marland Kitchen losartan-hydrochlorothiazide (HYZAAR) 100-25 MG per tablet TAKE 1 TABLET DAILY (Patient taking differently: TAKE 1 TABLET DAILY--  TAKES IN AM) 90 tablet 0  . MELATONIN PO Take 1 tablet by mouth at bedtime as needed.     . metoprolol tartrate (LOPRESSOR) 25 MG tablet Take 0.5 tablets (12.5 mg total) by mouth 2 (two) times daily. (Patient taking differently: Take 12.5 mg by mouth 2 (two) times daily. ) 90 tablet 0  . montelukast (SINGULAIR) 10 MG tablet TAKE 1 TABLET EVERY MORNING 90 tablet 0   No current facility-administered medications for this visit.    Social History   Social History  . Marital Status: Married    Spouse Name: N/A  . Number of Children: N/A  . Years of Education: N/A   Occupational History  . Not on file.   Social History Main Topics  . Smoking status: Former Smoker -- 5 years    Types: Cigarettes    Quit date: 04/15/1989  . Smokeless tobacco: Never Used  . Alcohol Use: 0.6 oz/week    1 Glasses of wine per week     Comment: occasional wine  . Drug Use: No  . Sexual Activity: Not on file   Other Topics Concern  . Not on file   Social History Narrative    Family History  Problem Relation Age of Onset  . Cancer Maternal Grandmother   . Hypertension Father   . Heart disease Father   . Hypertension Mother    . Heart disease Mother   . Cancer Paternal Grandmother     breast cancer  . Colon cancer Paternal Pasty Spillers, MD 02/21/2015, 12:20 PM

## 2015-03-04 ENCOUNTER — Telehealth: Payer: Self-pay | Admitting: Gynecologic Oncology

## 2015-03-04 NOTE — Telephone Encounter (Signed)
Patient called stating she would like to move her surgery out to a later date so she could get caught up at work.  WLE moved to November 3.  Advised to call the office if the surgery needed to be done sooner due to symptoms.  Advised to call for any questions or concerns.

## 2015-03-05 ENCOUNTER — Other Ambulatory Visit: Payer: Self-pay | Admitting: Family Medicine

## 2015-03-13 ENCOUNTER — Ambulatory Visit (INDEPENDENT_AMBULATORY_CARE_PROVIDER_SITE_OTHER): Payer: BLUE CROSS/BLUE SHIELD | Admitting: Family Medicine

## 2015-03-13 ENCOUNTER — Encounter: Payer: Self-pay | Admitting: Family Medicine

## 2015-03-13 ENCOUNTER — Other Ambulatory Visit: Payer: Self-pay | Admitting: Family Medicine

## 2015-03-13 VITALS — BP 110/68 | HR 58 | Temp 98.6°F | Ht 64.25 in | Wt 165.0 lb

## 2015-03-13 DIAGNOSIS — Z23 Encounter for immunization: Secondary | ICD-10-CM

## 2015-03-13 DIAGNOSIS — J309 Allergic rhinitis, unspecified: Secondary | ICD-10-CM

## 2015-03-13 DIAGNOSIS — G43009 Migraine without aura, not intractable, without status migrainosus: Secondary | ICD-10-CM

## 2015-03-13 DIAGNOSIS — I10 Essential (primary) hypertension: Secondary | ICD-10-CM

## 2015-03-13 DIAGNOSIS — D509 Iron deficiency anemia, unspecified: Secondary | ICD-10-CM

## 2015-03-13 DIAGNOSIS — Z1231 Encounter for screening mammogram for malignant neoplasm of breast: Secondary | ICD-10-CM

## 2015-03-13 MED ORDER — LOSARTAN POTASSIUM-HCTZ 100-25 MG PO TABS: ORAL_TABLET | ORAL | Status: DC

## 2015-03-13 MED ORDER — METOPROLOL TARTRATE 25 MG PO TABS: ORAL_TABLET | ORAL | Status: DC

## 2015-03-13 NOTE — Assessment & Plan Note (Signed)
ON bblocker. NO recent issues with migraine.

## 2015-03-13 NOTE — Progress Notes (Signed)
Pre visit review using our clinic review tool, if applicable. No additional management support is needed unless otherwise documented below in the visit note. 

## 2015-03-13 NOTE — Assessment & Plan Note (Signed)
Due for re-eval ov cbc.

## 2015-03-13 NOTE — Addendum Note (Signed)
Addended by: Carter Kitten on: 03/13/2015 08:59 AM   Modules accepted: Orders

## 2015-03-13 NOTE — Assessment & Plan Note (Addendum)
Stable control on current regimen. 

## 2015-03-13 NOTE — Patient Instructions (Addendum)
Call Dr. Carlean Purl to schedule colonoscopy.  Call to schedule mammogram on your own.  Schedule fasting labs as able in next few weeks. Cancel appt in 08/2015.

## 2015-03-13 NOTE — Assessment & Plan Note (Signed)
Well controlled. Continue current medication. Encouraged exercise, weight loss, healthy eating habits.  

## 2015-03-13 NOTE — Progress Notes (Signed)
64 year old female presents for follow up and annual medications refill.  Saw GYN (Dr. Aldean Ast) for Paget's disease of vulva, s/p excision in 2011... No recurrence.   Hypertension: Well controlled on losartan and lopressor.  BP Readings from Last 3 Encounters:  03/13/15 110/68  02/21/15 137/76  01/31/15 141/76  Using medication without problems or lightheadedness: None  Chest pain with exertion: None  Edema:None  Short of breath: None Average home BPs: well controlled Other issues:  She is working on exercise and weight loss.   Allergic rhinitits: Seeing allergist for multiple allergies: fluticasone nasal, alvesco, claritin.  She has had improvement with hoarseness, SOB  Migraine: none in last year.  Continues to do better on essential oils.. No ST, no sinus pressure.  Due for routine labs. She has been eating healthier, she has cut out fat. She is walking weekly. Wt Readings from Last 3 Encounters:  03/13/15 165 lb (74.844 kg)  02/21/15 164 lb 8 oz (74.617 kg)  01/31/15 162 lb 6.4 oz (73.664 kg)   Review of Systems  Constitutional: Negative for fever, fatigue and unexpected weight change.  HENT: Negative for ear pain, congestion, sore throat, sneezing, trouble swallowing and sinus pressure.  Eyes: Negative for pain and itching.  Respiratory: No SOB, recennt sinus infecitons. Cardiovascular: Negative for chest pain, palpitations and leg swelling.  Gastrointestinal: Negative for nausea, abdominal pain, diarrhea, constipation and blood in stool.  Genitourinary: Negative for dysuria, hematuria, vaginal discharge, difficulty urinating and menstrual problem.  Musculoskeletal: Skin: Negative for rash.  Neurological: Negative for syncope, weakness, light-headedness, numbness and headaches.  Psychiatric/Behavioral: Negative for confusion and dysphoric mood. The patient is not nervous/anxious.  Objective:   Physical Exam  Constitutional: Vital signs are  normal. She appears well-developed and well-nourished. She is cooperative. Non-toxic appearance. She does not appear ill. No distress.  HENT:  Head: Normocephalic.  Right Ear: Hearing, tympanic membrane, external ear and ear canal normal.  Left Ear: Hearing, tympanic membrane, external ear and ear canal normal.  Nose: Nose normal.  Eyes: Conjunctivae, EOM and lids are normal. Pupils are equal, round, and reactive to light. No foreign bodies found.  Neck: Trachea normal and normal range of motion. Neck supple. Carotid bruit is not present. No mass and no thyromegaly present.  Cardiovascular: Normal rate, regular rhythm, S1 normal, S2 normal, normal heart sounds and intact distal pulses. Exam reveals no gallop.  No murmur heard.  Pulmonary/Chest: Effort normal and breath sounds normal. No respiratory distress. She has no wheezes. She has no rhonchi. She has no rales.  Abdominal: Soft. Normal appearance and bowel sounds are normal. She exhibits no distension, no fluid wave, no abdominal bruit and no mass. There is no hepatosplenomegaly. There is no tenderness. There is no rebound, no guarding and no CVA tenderness. No hernia.  Lymphadenopathy:  She has no cervical adenopathy.  She has no axillary adenopathy.  Neurological: She is alert. She has normal strength. No cranial nerve deficit or sensory deficit.  Skin: Skin is warm, dry and intact. No rash noted.  Psychiatric: Her speech is normal and behavior is normal. Judgment normal. Her mood appears not anxious. Cognition and memory are normal. She does not exhibit a depressed mood.  Assessment & Plan:   CPX: The patient's preventative maintenance and recommended screening tests for an annual wellness exam were reviewed in full today.  Brought up to date unless services declined.  Counselled on the importance of diet, exercise, and its role in overall health and mortality.  The patient's FH and SH was reviewed, including their home  life, tobacco status, and drug and alcohol status.   Vaccines: Uptodate with Tdap, flu given, will look into shingles. PAP/DVE: Followed by GYN. Mammo: 02/2014 nml  Colon: nml 2011 Dr. Carlean Purl, repeat in 5 years.

## 2015-03-19 ENCOUNTER — Ambulatory Visit
Admission: RE | Admit: 2015-03-19 | Discharge: 2015-03-19 | Disposition: A | Discharge status: Home / Self Care | Payer: BLUE CROSS/BLUE SHIELD | Source: Ambulatory Visit | Attending: Family Medicine | Admitting: Family Medicine

## 2015-03-19 ENCOUNTER — Other Ambulatory Visit (INDEPENDENT_AMBULATORY_CARE_PROVIDER_SITE_OTHER): Payer: BLUE CROSS/BLUE SHIELD

## 2015-03-19 ENCOUNTER — Other Ambulatory Visit: Payer: Self-pay | Admitting: Family Medicine

## 2015-03-19 DIAGNOSIS — I10 Essential (primary) hypertension: Secondary | ICD-10-CM | POA: Diagnosis not present

## 2015-03-19 DIAGNOSIS — Z1231 Encounter for screening mammogram for malignant neoplasm of breast: Secondary | ICD-10-CM

## 2015-03-19 DIAGNOSIS — D509 Iron deficiency anemia, unspecified: Secondary | ICD-10-CM

## 2015-03-19 LAB — COMPREHENSIVE METABOLIC PANEL
ALT: 10 U/L (ref 0–35)
AST: 15 U/L (ref 0–37)
Albumin: 4.1 g/dL (ref 3.5–5.2)
Alkaline Phosphatase: 47 U/L (ref 39–117)
BILIRUBIN TOTAL: 0.4 mg/dL (ref 0.2–1.2)
BUN: 14 mg/dL (ref 6–23)
CO2: 33 meq/L — AB (ref 19–32)
CREATININE: 0.82 mg/dL (ref 0.40–1.20)
Calcium: 9.5 mg/dL (ref 8.4–10.5)
Chloride: 100 mEq/L (ref 96–112)
GFR: 75.04 mL/min (ref >60.00)
Glucose, Bld: 80 mg/dL (ref 70–99)
Potassium: 3.9 mEq/L (ref 3.5–5.1)
SODIUM: 139 meq/L (ref 135–145)
Total Protein: 7 g/dL (ref 6.0–8.3)

## 2015-03-19 LAB — LIPID PANEL
Cholesterol: 225 mg/dL — ABNORMAL HIGH (ref 0–200)
HDL: 77.4 mg/dL (ref >39.00)
LDL CALC: 127 mg/dL — AB (ref 0–99)
NONHDL: 147.65
Total CHOL/HDL Ratio: 3
Triglycerides: 101 mg/dL (ref 0.0–149.0)
VLDL: 20.2 mg/dL (ref 0.0–40.0)

## 2015-03-19 LAB — CBC WITH DIFFERENTIAL/PLATELET
Basophils Absolute: 0 10*3/uL (ref 0.0–0.1)
Basophils Relative: 0.5 % (ref 0.0–3.0)
EOS ABS: 0.1 10*3/uL (ref 0.0–0.7)
Eosinophils Relative: 1.6 % (ref 0.0–5.0)
HEMATOCRIT: 40.6 % (ref 36.0–46.0)
Hemoglobin: 13.3 g/dL (ref 12.0–15.0)
Lymphocytes Relative: 36.7 % (ref 12.0–46.0)
Lymphs Abs: 2.5 10*3/uL (ref 0.7–4.0)
MCHC: 32.8 g/dL (ref 30.0–36.0)
MCV: 96 fl (ref 78.0–100.0)
MONOS PCT: 7.1 % (ref 3.0–12.0)
Monocytes Absolute: 0.5 10*3/uL (ref 0.1–1.0)
NEUTROS PCT: 54.1 % (ref 43.0–77.0)
Neutro Abs: 3.6 10*3/uL (ref 1.4–7.7)
Platelets: 261 10*3/uL (ref 150.0–400.0)
RBC: 4.23 Mil/uL (ref 3.87–5.11)
RDW: 13 % (ref 11.5–15.5)
WBC: 6.7 10*3/uL (ref 4.0–10.5)

## 2015-03-19 IMAGING — MG MM DIGITAL SCREENING BILAT W/ CAD
5 series · 5 of 5 positions shown · non-contrast
Comparison: Previous exam(s).

CLINICAL DATA: Screening.

EXAM:
DIGITAL SCREENING BILATERAL MAMMOGRAM WITH CAD

[R MLO (1 of 2)]
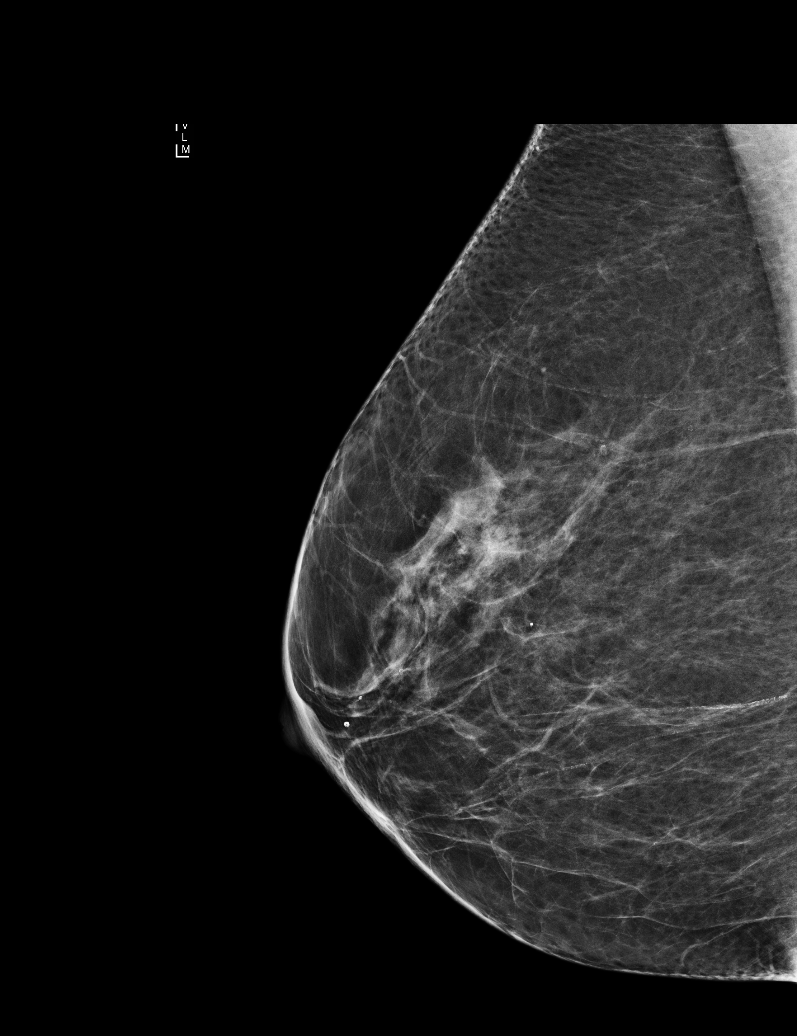

[R CC]
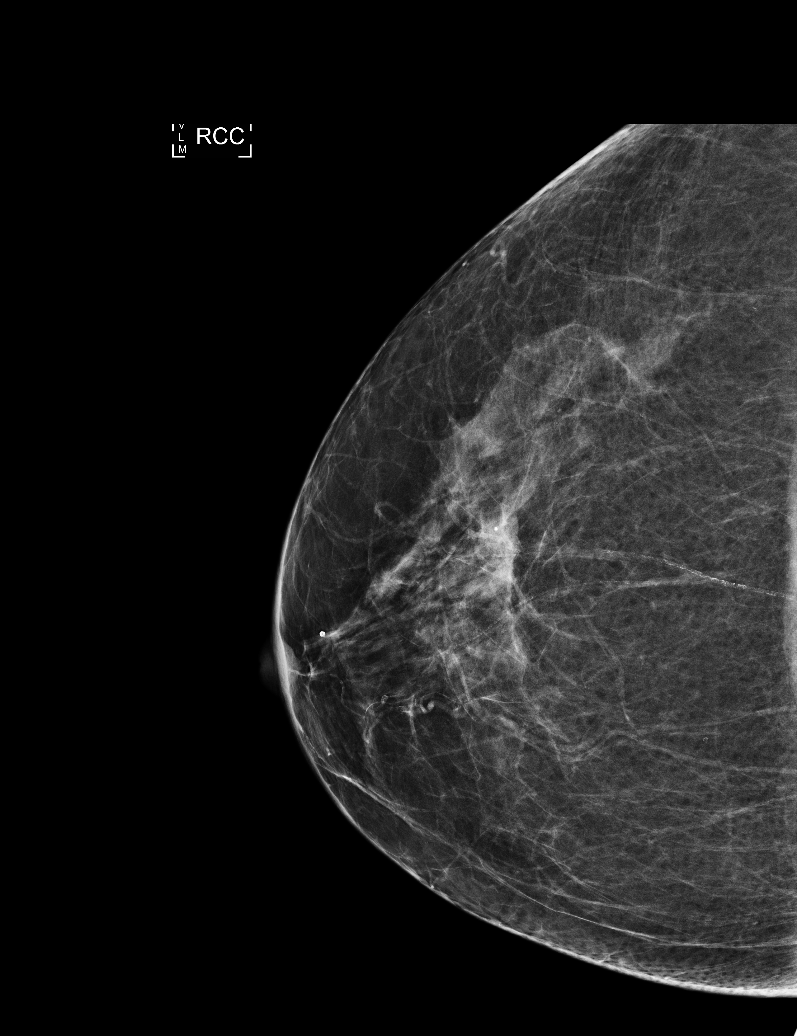

[L CC]
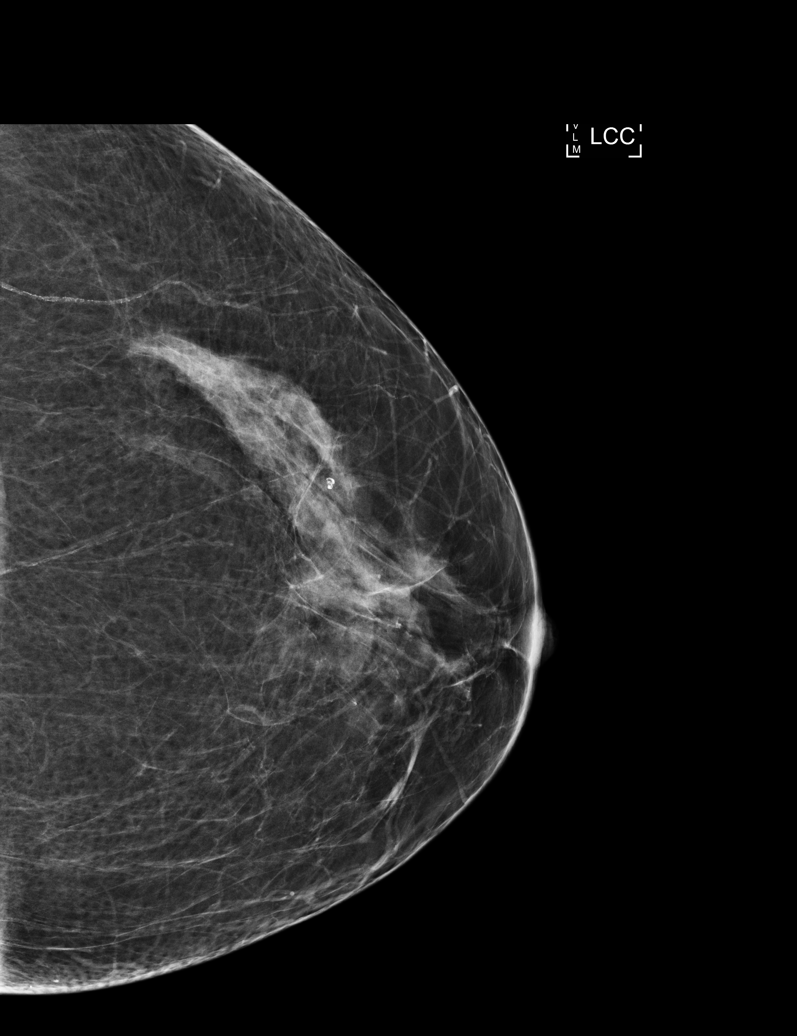

[L MLO]
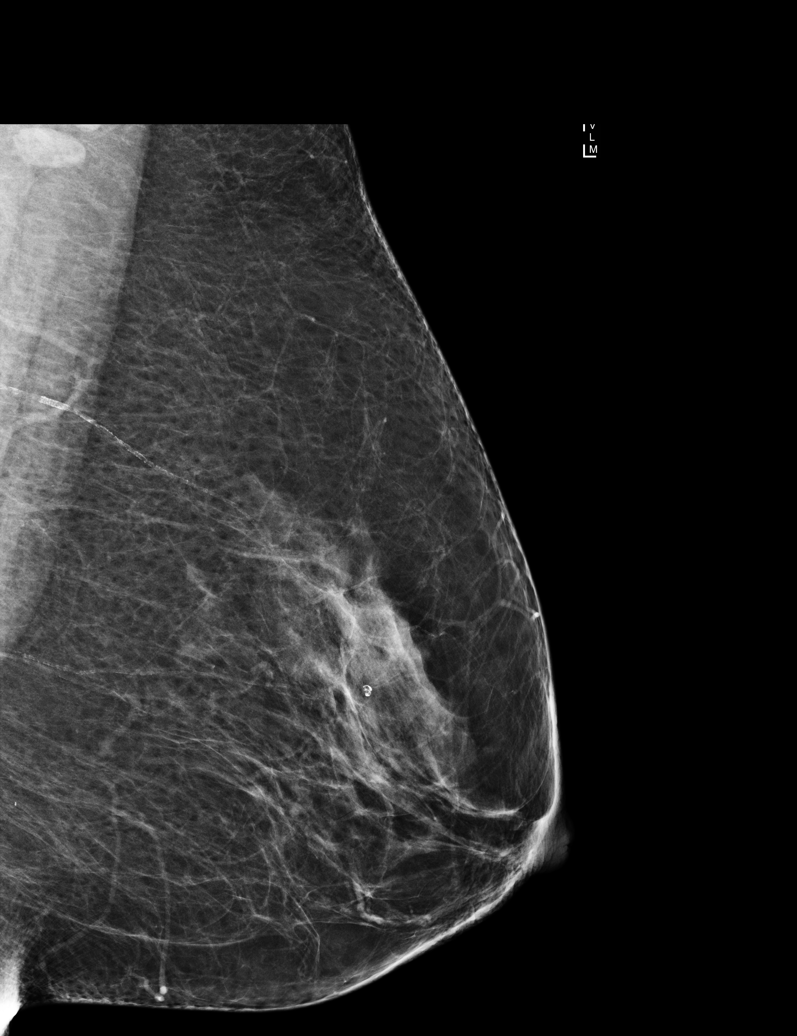

[R MLO (2 of 2)]
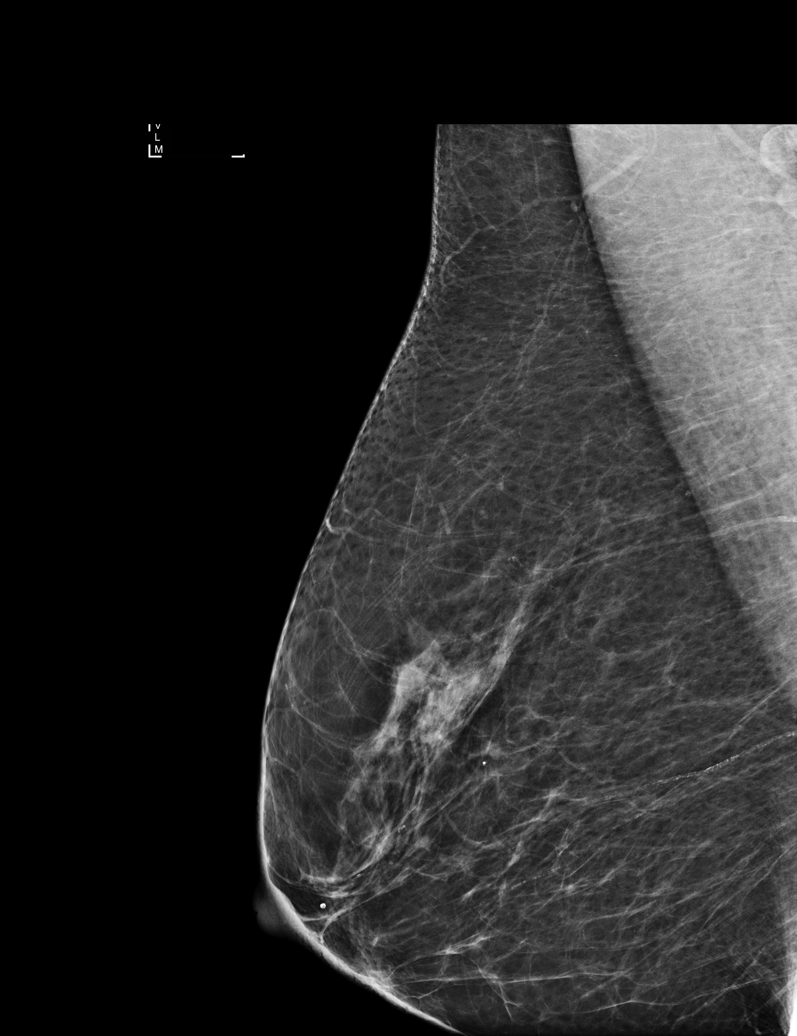

[5 of 5 positions shown; findings below may reference images not displayed]

ACR Breast Density Category c: The breast tissue is heterogeneously
dense, which may obscure small masses.
FINDINGS: There are no findings suspicious for malignancy. Images were
processed with CAD.
IMPRESSION: No mammographic evidence of malignancy. A result letter of this
screening mammogram will be mailed directly to the patient.

RECOMMENDATION:
Screening mammogram in one year. (Code:[0J])

BI-RADS CATEGORY  1: Negative.

## 2015-03-25 ENCOUNTER — Other Ambulatory Visit: Payer: Self-pay | Admitting: Family Medicine

## 2015-04-03 ENCOUNTER — Encounter (HOSPITAL_BASED_OUTPATIENT_CLINIC_OR_DEPARTMENT_OTHER): Payer: Self-pay | Admitting: *Deleted

## 2015-04-03 NOTE — Progress Notes (Signed)
NPO AFTER MN.  ARRIVE AT 0630.  CURRENT LAB  RESULTS AND EKG IN CHART AND EPIC. WILL TAKE METOPROLOL, PREVACID, AND SINGULAIR AM DOS W/ SIPS OF WATER.

## 2015-04-09 NOTE — Anesthesia Preprocedure Evaluation (Addendum)
Anesthesia Evaluation  Patient identified by MRN, date of birth, ID band Patient awake    Reviewed: Allergy & Precautions, Patient's Chart, lab work & pertinent test results  History of Anesthesia Complications (+) PONV and history of anesthetic complications  Airway Mallampati: II  TM Distance: >3 FB Neck ROM: Full    Dental  (+) Teeth Intact, Dental Advisory Given   Pulmonary former smoker,    Pulmonary exam normal        Cardiovascular hypertension, Pt. on home beta blockers and Pt. on medications Normal cardiovascular exam     Neuro/Psych  Headaches, negative psych ROS   GI/Hepatic Neg liver ROS, GERD  ,  Endo/Other  negative endocrine ROS  Renal/GU negative Renal ROS     Musculoskeletal   Abdominal   Peds  Hematology   Anesthesia Other Findings   Reproductive/Obstetrics                          Anesthesia Physical Anesthesia Plan  ASA: II  Anesthesia Plan: General and MAC   Post-op Pain Management:    Induction: Intravenous  Airway Management Planned: Simple Face Mask  Additional Equipment:   Intra-op Plan:   Post-operative Plan:   Informed Consent: I have reviewed the patients History and Physical, chart, labs and discussed the procedure including the risks, benefits and alternatives for the proposed anesthesia with the patient or authorized representative who has indicated his/her understanding and acceptance.   Dental advisory given  Plan Discussed with: CRNA and Anesthesiologist  Anesthesia Plan Comments:       Anesthesia Quick Evaluation

## 2015-04-10 ENCOUNTER — Ambulatory Visit (HOSPITAL_BASED_OUTPATIENT_CLINIC_OR_DEPARTMENT_OTHER): Payer: BLUE CROSS/BLUE SHIELD | Admitting: Anesthesiology

## 2015-04-10 ENCOUNTER — Encounter (HOSPITAL_BASED_OUTPATIENT_CLINIC_OR_DEPARTMENT_OTHER)
Admission: RE | Disposition: A | Discharge status: Home / Self Care | Payer: Self-pay | Source: Ambulatory Visit | Attending: Gynecology

## 2015-04-10 ENCOUNTER — Ambulatory Visit (HOSPITAL_BASED_OUTPATIENT_CLINIC_OR_DEPARTMENT_OTHER)
Admission: RE | Admit: 2015-04-10 | Discharge: 2015-04-10 | Disposition: A | Discharge status: Home / Self Care | Payer: BLUE CROSS/BLUE SHIELD | Source: Ambulatory Visit | Attending: Gynecology | Admitting: Gynecology

## 2015-04-10 ENCOUNTER — Encounter (HOSPITAL_BASED_OUTPATIENT_CLINIC_OR_DEPARTMENT_OTHER): Payer: Self-pay | Admitting: *Deleted

## 2015-04-10 DIAGNOSIS — I10 Essential (primary) hypertension: Secondary | ICD-10-CM | POA: Diagnosis not present

## 2015-04-10 DIAGNOSIS — G43909 Migraine, unspecified, not intractable, without status migrainosus: Secondary | ICD-10-CM | POA: Insufficient documentation

## 2015-04-10 DIAGNOSIS — L292 Pruritus vulvae: Secondary | ICD-10-CM | POA: Diagnosis present

## 2015-04-10 DIAGNOSIS — Z79899 Other long term (current) drug therapy: Secondary | ICD-10-CM | POA: Diagnosis not present

## 2015-04-10 DIAGNOSIS — M1990 Unspecified osteoarthritis, unspecified site: Secondary | ICD-10-CM | POA: Insufficient documentation

## 2015-04-10 DIAGNOSIS — Z7952 Long term (current) use of systemic steroids: Secondary | ICD-10-CM | POA: Diagnosis not present

## 2015-04-10 DIAGNOSIS — Z87891 Personal history of nicotine dependence: Secondary | ICD-10-CM | POA: Diagnosis not present

## 2015-04-10 DIAGNOSIS — K219 Gastro-esophageal reflux disease without esophagitis: Secondary | ICD-10-CM | POA: Insufficient documentation

## 2015-04-10 DIAGNOSIS — C519 Malignant neoplasm of vulva, unspecified: Secondary | ICD-10-CM | POA: Insufficient documentation

## 2015-04-10 DIAGNOSIS — D4959 Neoplasm of unspecified behavior of other genitourinary organ: Secondary | ICD-10-CM

## 2015-04-10 DIAGNOSIS — Z7951 Long term (current) use of inhaled steroids: Secondary | ICD-10-CM | POA: Insufficient documentation

## 2015-04-10 HISTORY — PX: VULVECTOMY: SHX1086

## 2015-04-10 HISTORY — DX: Nausea with vomiting, unspecified: R11.2

## 2015-04-10 HISTORY — DX: Other specified postprocedural states: Z98.890

## 2015-04-10 SURGERY — WIDE EXCISION VULVECTOMY
Anesthesia: Monitor Anesthesia Care | Site: Vulva | Laterality: Right

## 2015-04-10 MED ORDER — DEXAMETHASONE SODIUM PHOSPHATE 4 MG/ML IJ SOLN
INTRAMUSCULAR | Status: DC | PRN
  Administered 2015-04-10: 10 mg via INTRAVENOUS

## 2015-04-10 MED ORDER — ACETIC ACID 5 % SOLN
Status: DC | PRN
  Administered 2015-04-10: 1 via TOPICAL

## 2015-04-10 MED ORDER — MIDAZOLAM HCL 5 MG/5ML IJ SOLN
INTRAMUSCULAR | Status: DC | PRN
  Administered 2015-04-10: 0.5 mg via INTRAVENOUS
  Administered 2015-04-10: 2 mg via INTRAVENOUS
  Administered 2015-04-10: 0.5 mg via INTRAVENOUS

## 2015-04-10 MED ORDER — KETOROLAC TROMETHAMINE 30 MG/ML IJ SOLN
INTRAMUSCULAR | Status: DC | PRN
  Administered 2015-04-10: 30 mg via INTRAVENOUS

## 2015-04-10 MED ORDER — PROPOFOL 10 MG/ML IV BOLUS
INTRAVENOUS | Status: DC | PRN
  Administered 2015-04-10: 20 mg via INTRAVENOUS

## 2015-04-10 MED ORDER — ONDANSETRON HCL 4 MG/2ML IJ SOLN
INTRAMUSCULAR | Status: DC | PRN
  Administered 2015-04-10: 4 mg via INTRAVENOUS

## 2015-04-10 MED ORDER — SCOPOLAMINE 1 MG/3DAYS TD PT72
1.0000 | MEDICATED_PATCH | TRANSDERMAL | Status: DC
  Administered 2015-04-10: 1.5 mg via TRANSDERMAL
  Filled 2015-04-10: qty 1

## 2015-04-10 MED ORDER — BUPIVACAINE HCL (PF) 0.25 % IJ SOLN
INTRAMUSCULAR | Status: DC | PRN
  Administered 2015-04-10: 3 mL

## 2015-04-10 MED ORDER — LIDOCAINE HCL (CARDIAC) 20 MG/ML IV SOLN
INTRAVENOUS | Status: DC | PRN
  Administered 2015-04-10: 40 mg via INTRAVENOUS

## 2015-04-10 MED ORDER — MIDAZOLAM HCL 2 MG/2ML IJ SOLN
INTRAMUSCULAR | Status: AC
  Filled 2015-04-10: qty 4

## 2015-04-10 MED ORDER — SCOPOLAMINE 1 MG/3DAYS TD PT72
MEDICATED_PATCH | TRANSDERMAL | Status: AC
  Filled 2015-04-10: qty 1

## 2015-04-10 MED ORDER — LACTATED RINGERS IV SOLN
INTRAVENOUS | Status: DC
  Administered 2015-04-10 (×3): via INTRAVENOUS
  Filled 2015-04-10: qty 1000

## 2015-04-10 MED ORDER — FENTANYL CITRATE (PF) 100 MCG/2ML IJ SOLN
INTRAMUSCULAR | Status: DC | PRN
  Administered 2015-04-10 (×8): 12.5 ug via INTRAVENOUS

## 2015-04-10 MED ORDER — KCL IN DEXTROSE-NACL 20-5-0.45 MEQ/L-%-% IV SOLN
INTRAVENOUS | Status: DC
  Filled 2015-04-10: qty 1000

## 2015-04-10 MED ORDER — OXYCODONE-ACETAMINOPHEN 5-325 MG PO TABS
1.0000 | ORAL_TABLET | ORAL | Status: DC | PRN
  Filled 2015-04-10: qty 2

## 2015-04-10 MED ORDER — PROPOFOL 500 MG/50ML IV EMUL
INTRAVENOUS | Status: DC | PRN
  Administered 2015-04-10: 25 ug/kg/min via INTRAVENOUS

## 2015-04-10 MED ORDER — FENTANYL CITRATE (PF) 100 MCG/2ML IJ SOLN
INTRAMUSCULAR | Status: AC
  Filled 2015-04-10: qty 4

## 2015-04-10 SURGICAL SUPPLY — 34 items
APPLICATOR COTTON TIP 6IN STRL (MISCELLANEOUS) IMPLANT
BLADE SURG 15 STRL LF DISP TIS (BLADE) ×1 IMPLANT
BLADE SURG 15 STRL SS (BLADE) ×1
CANISTER SUCTION 2500CC (MISCELLANEOUS) ×2 IMPLANT
CATH ROBINSON RED A/P 16FR (CATHETERS) IMPLANT
COVER BACK TABLE 60X90IN (DRAPES) ×2 IMPLANT
DRAPE LG THREE QUARTER DISP (DRAPES) ×2 IMPLANT
DRAPE UNDERBUTTOCKS STRL (DRAPE) ×2 IMPLANT
DRSG TELFA 3X8 NADH (GAUZE/BANDAGES/DRESSINGS) ×2 IMPLANT
ELECT REM PT RETURN 9FT ADLT (ELECTROSURGICAL) ×2
ELECTRODE REM PT RTRN 9FT ADLT (ELECTROSURGICAL) ×1 IMPLANT
GLOVE BIO SURGEON STRL SZ7.5 (GLOVE) ×4 IMPLANT
GOWN STRL REUS W/ TWL LRG LVL3 (GOWN DISPOSABLE) ×1 IMPLANT
GOWN STRL REUS W/ TWL XL LVL3 (GOWN DISPOSABLE) ×1 IMPLANT
GOWN STRL REUS W/TWL LRG LVL3 (GOWN DISPOSABLE) ×1
GOWN STRL REUS W/TWL XL LVL3 (GOWN DISPOSABLE) ×1
KIT ROOM TURNOVER WOR (KITS) ×2 IMPLANT
LEGGING LITHOTOMY PAIR STRL (DRAPES) ×2 IMPLANT
NEEDLE HYPO 25X1 1.5 SAFETY (NEEDLE) ×2 IMPLANT
NS IRRIG 500ML POUR BTL (IV SOLUTION) IMPLANT
PACK BASIN DAY SURGERY FS (CUSTOM PROCEDURE TRAY) ×2 IMPLANT
PAD OB MATERNITY 4.3X12.25 (PERSONAL CARE ITEMS) ×2 IMPLANT
PAD PREP 24X48 CUFFED NSTRL (MISCELLANEOUS) ×2 IMPLANT
PENCIL BUTTON HOLSTER BLD 10FT (ELECTRODE) ×2 IMPLANT
SCOPETTES 8  STERILE (MISCELLANEOUS)
SCOPETTES 8 STERILE (MISCELLANEOUS) IMPLANT
SPONGE LAP 18X18 X RAY DECT (DISPOSABLE) ×2 IMPLANT
SUT VIC AB 3-0 SH 27 (SUTURE) ×1
SUT VIC AB 3-0 SH 27X BRD (SUTURE) ×1 IMPLANT
TOWEL OR 17X24 6PK STRL BLUE (TOWEL DISPOSABLE) ×4 IMPLANT
TRAY DSU PREP LF (CUSTOM PROCEDURE TRAY) ×2 IMPLANT
TUBE CONNECTING 12X1/4 (SUCTIONS) ×2 IMPLANT
WATER STERILE IRR 500ML POUR (IV SOLUTION) ×2 IMPLANT
YANKAUER SUCT BULB TIP NO VENT (SUCTIONS) ×2 IMPLANT

## 2015-04-10 NOTE — Discharge Instructions (Signed)
Call your surgeon if you experience:   1.  Fever over 101.0. 2.  Inability to urinate. 3.  Nausea and/or vomiting. 4.  Extreme swelling or bruising at the surgical site. 5.  Continued bleeding from the incision. 6.  Increased pain, redness or drainage from the incision. 7.  Problems related to your pain medication. 8. Any change in color,and/or sensation 9. Any problems and/or concerns   Post Anesthesia Home Care Instructions  Activity: Get plenty of rest for the remainder of the day. A responsible adult should stay with you for 24 hours following the procedure.  For the next 24 hours, DO NOT: -Drive a car -Paediatric nurse -Drink alcoholic beverages -Take any medication unless instructed by your physician -Make any legal decisions or sign important papers.  Meals: Start with liquid foods such as gelatin or soup. Progress to regular foods as tolerated. Avoid greasy, spicy, heavy foods. If nausea and/or vomiting occur, drink only clear liquids until the nausea and/or vomiting subsides. Call your physician if vomiting continues.  Special Instructions/Symptoms: Your throat may feel dry or sore from the anesthesia or the breathing tube placed in your throat during surgery. If this causes discomfort, gargle with warm salt water. The discomfort should disappear within 24 hours.  If you had a scopolamine patch placed behind your ear for the management of post- operative nausea and/or vomiting:  1. The medication in the patch is effective for 72 hours, after which it should be removed.  Wrap patch in a tissue and discard in the trash. Wash hands thoroughly with soap and water. 2. You may remove the patch earlier than 72 hours if you experience unpleasant side effects which may include dry mouth, dizziness or visual disturbances. 3. Avoid touching the patch. Wash your hands with soap and water after contact with the patch.

## 2015-04-10 NOTE — Anesthesia Postprocedure Evaluation (Signed)
Anesthesia Post Note  Patient: [name]  Procedure(s) Performed: Procedure(s) (LRB): WIDE LOCAL EXCISION VULVA (Right)  Anesthesia type: MAC  Patient location: PACU  Post pain: Pain level controlled  Post assessment: Patient's Cardiovascular Status Stable  Last Vitals:  Filed Vitals:   04/10/15 0915  BP: 114/72  Pulse: 62  Temp:   Resp: 14    Post vital signs: Reviewed and stable  Level of consciousness: sedated  Complications: No apparent anesthesia complications

## 2015-04-10 NOTE — H&P (Signed)
Expand All Collapse All   Consult Note: Gyn-Onc   [name] 64 y.o. female  Chief Complaint  Patient presents with  . Paget's disease of vulva    post-op check    Assessment : Paget's disease of the vulva  .Marland Kitchen Persistent pruritus.  Plan:  Wide local excision on 04/10/15  Interval History: Patient returns today because of persistent symptoms of pruritus on her right vulva. She underwent wide local excision of the lesion on August 5. No invasive carcinoma was identified although there were some focal margin involvement from 12 to 3:00 and 9 to 12:00. At the time of surgery I excised all obvious lesion. Initially she's had an uncomplicated postoperative course.  HPI:The patient initially underwent surgery for Paget's disease of the vulva in October 2011. There was focal involvement of the medial margin. The entire lesion is approximately 5 cm in diameter.   The patient previously had a supracervical hysterectomy but has her ovaries and cervix in situ.  She notes that she had some repair done at the time of her hysterectomy and at her introitus is tight and she has difficulty having intercourse and also has tearing. On the other hand her husband has had prostate cancer surgery making intercourse difficult as well.  Patient underwent wide local excision of a recurrent lesion on August 50,016 while all gross lesion was removed she did have positive surgical margins that were focal     Review of Systems:10 point review of systems is negative except as noted in interval history.   Vitals: Blood pressure 141/76, pulse 51, temperature 98.2 F (36.8 C), temperature source Oral, resp. rate 18, height 5\' 4"  (1.626 m), weight 162 lb 6.4 oz (73.664 kg), SpO2 100 %.  Physical Exam: General : The patient is a healthy woman in no acute distress.  HEENT: normocephalic, extraoccular movements normal; neck is supple without thyromegally  Lynphnodes: Supraclavicular and inguinal nodes  not enlarged  Abdomen: Soft, non-tender, no ascites, no organomegally, no masses, no hernias  Pelvic:  EGBUS: Normal female status post partial vulvectomy with flattening of the right labia minora. There is an area overlying the upper aspect of the incision on the right vulva which is somewhat erythematous. Palpation in this area also reveals thickening consistent with postoperative scarring and healing. There is no evidence of infection.  Vagina: Atrophic, no lesions  Urethra and Bladder: Normal, non-tender  Cervix: Atrophic Uterus: Surgically absent  Bi-manual examination: Non-tender; no adenxal masses or nodularity  Rectal: normal sphincter tone, no masses, no blood  Lower extremities: No edema or varicosities. Normal range of motion      Procedure note after verbal informed consent and timeout the vulva was prepped with Betadine and 1% Xylocaine is injected. A punch biopsy is obtained hemostasis achieved with silver nitrate. Specimen was submitted to pathology.    Allergies  Allergen Reactions  . Ibuprofen Hypertension  . Naprosyn [Naproxen] Nausea Only  . Other     Allergic to toothpaste- makes mouth peel Hay fever/dust mites/trees "365 allergic"   . Azithromycin Itching and Rash  . Sulfa Antibiotics Hives and Rash    Past Medical History  Diagnosis Date  . Hypertension   . Headache(784.0)     Migraines  . Arthritis   . Paget's disease of vulva   . GERD (gastroesophageal reflux disease)   . History of adenomatous polyp of colon   . History of palpitations   . Allergic rhinitis   . History of concussion  AS CHILD-- NO RESIDUAL  . Wears glasses     Past Surgical History  Procedure Laterality Date  . Simple vulvectomy  03-24-2010    right side  . Wisdom tooth extraction    . Pulley release right thumb  03-23-2011  . Pullery release left thumb, left ring finger/  excision mucoid tumor and debridement left ring finger joint  02-17-2011  . Vaginal hysterectomy  1989    and Anterior and posterior repair's for prolapse  . Tonsillectomy  1977  . Vulvectomy Right 01/10/2015    Procedure: RIGHT WIDE LOCAL EXCISION VULVECTOMY; Surgeon: [name], MD; Location: [facility]; Service: Gynecology; Laterality: Right;    Current Outpatient Prescriptions  Medication Sig Dispense Refill  . albuterol (PROVENTIL HFA;VENTOLIN HFA) 108 (90 BASE) MCG/ACT inhaler Inhale 2 puffs into the lungs every 6 (six) hours as needed. For wheezing    . clobetasol ointment (TEMOVATE) 1.61 % Apply 1 application topically 2 (two) times daily as needed. 15 g 2  . fluticasone (FLONASE) 50 MCG/ACT nasal spray Place 2 sprays into the nose daily as needed.     . lansoprazole (PREVACID) 30 MG capsule Take 30 mg by mouth 2 (two) times daily.     Marland Kitchen losartan-hydrochlorothiazide (HYZAAR) 100-25 MG per tablet TAKE 1 TABLET DAILY (Patient taking differently: TAKE 1 TABLET DAILY-- TAKES IN AM) 90 tablet 0  . metoprolol tartrate (LOPRESSOR) 25 MG tablet Take 0.5 tablets (12.5 mg total) by mouth 2 (two) times daily. (Patient taking differently: Take 12.5 mg by mouth 2 (two) times daily. ) 90 tablet 0  . montelukast (SINGULAIR) 10 MG tablet TAKE 1 TABLET EVERY MORNING 90 tablet 0  . fluticasone (CUTIVATE) 0.05 % cream Apply topically as needed.    Marland Kitchen HYDROcodone-acetaminophen (NORCO/VICODIN) 5-325 MG per tablet Take 1 tablet by mouth every 6 (six) hours as needed for moderate pain. (Patient not taking: Reported on 01/31/2015) 30 tablet 0  . MELATONIN PO Take 1 tablet by mouth at bedtime as needed.      No current facility-administered medications for this visit.    Social History   Social History  . Marital Status: Married    Spouse Name: N/A  . Number of Children: N/A  .  Years of Education: N/A   Occupational History  . Not on file.   Social History Main Topics  . Smoking status: Former Smoker -- 5 years    Types: Cigarettes    Quit date: 04/15/1989  . Smokeless tobacco: Never Used  . Alcohol Use: 0.6 oz/week    1 Glasses of wine per week     Comment: occasional wine  . Drug Use: No  . Sexual Activity: Not on file   Other Topics Concern  . Not on file   Social History Narrative    Family History  Problem Relation Age of Onset  . Cancer Maternal Grandmother   . Hypertension Father   . Heart disease Father   . Hypertension Mother   . Heart disease Mother   . Cancer Paternal Grandmother     breast cancer  . Colon cancer Paternal Uncle       Alvino Chapel, MD

## 2015-04-10 NOTE — Interval H&P Note (Signed)
History and Physical Interval Note:  04/10/2015 7:52 AM  [name]  has presented today for surgery, with the diagnosis of PAGET'S DISASE OF VULVA   The various methods of treatment have been discussed with the patient and family. After consideration of risks, benefits and other options for treatment, the patient has consented to  Procedure(s): WIDE LOCAL EXCISION VULVA (N/A) as a surgical intervention .  The patient's history has been reviewed, patient examined, no change in status, stable for surgery.  I have reviewed the patient's chart and labs.  Questions were answered to the patient's satisfaction.     CLARKE-PEARSON,Toye Rouillard L

## 2015-04-10 NOTE — Op Note (Signed)
[name]  female MEDICAL RECORD [id number] DATE OF BIRTH: 1951-05-01 PHYSICIAN: [name], M.D  04/10/2015   OPERATIVE REPORT  PREOPERATIVE DIAGNOSIS: Recurrent Paget's disease of the vulva  POSTOPERATIVE DIAGNOSIS: Same  PROCEDURE: Wide local excision of recurrent Paget's disease on the right vulva  SURGEON: [name], M.D ANESTHESIA: Mac with 1% lidocaine local. ESTIMATED BLOOD LOSS: Minimal  SURGICAL FINDINGS: On the right anterior vulva there is approximately a 1 cm area of slightly thickened and erythematous skin consistent with recurrent Paget's disease. The remainder the vulva was normal. Specifically, I saw no lesions on the left vulva.  PROCEDURE: Patient brought the operating room and after satisfactory attainment of Mac anesthesia was placed in lithotomy position in [place]. The perineum and vagina were prepped with Betadine and the patient was draped. Surgical timeout was taken. The lesion on the right anterior vulva was again identified an elliptical incision created around it measuring approximately 3 x 1 cm. The lesion was excised with small amount of subcutaneous tissue. Hemostasis achieved with cautery. Skin was closed with a running subcuticular suture of 3-0 Vicryl. An ice pack was placed on the vulva. The patient was awakened from anesthesia and taken to the recovery room in stable condition. Sponge needle and isthmic counts correct 2.   [name], M.D

## 2015-04-10 NOTE — Transfer of Care (Signed)
Immediate Anesthesia Transfer of Care Note  Patient: [name]  Procedure(s) Performed: Procedure(s) (LRB): WIDE LOCAL EXCISION VULVA (Right)  Patient Location: PACU  Anesthesia Type: MAC  Level of Consciousness: awake, sedated, patient cooperative and responds to stimulation  Airway & Oxygen Therapy: Patient Spontanous Breathing and Patient connected to face mask oxygen  Post-op Assessment: Report given to PACU RN, Post -op Vital signs reviewed and stable and Patient moving all extremities  Post vital signs: Reviewed and stable  Complications: No apparent anesthesia complications

## 2015-04-11 ENCOUNTER — Telehealth: Payer: Self-pay | Admitting: Gynecologic Oncology

## 2015-04-11 ENCOUNTER — Encounter (HOSPITAL_BASED_OUTPATIENT_CLINIC_OR_DEPARTMENT_OTHER): Payer: Self-pay | Admitting: Gynecology

## 2015-04-11 NOTE — Telephone Encounter (Signed)
Patient called to let us know she is doing well after surgery.  No nausea or emesis reported.  Had scop patch placed during surgery and states it helped.  No other concerns voiced.  Advised to call for any needs or concerns.

## 2015-04-22 ENCOUNTER — Other Ambulatory Visit: Payer: Self-pay | Admitting: Orthopedic Surgery

## 2015-05-05 ENCOUNTER — Telehealth: Payer: Self-pay | Admitting: Gynecologic Oncology

## 2015-05-05 NOTE — Telephone Encounter (Signed)
Patient called this am stating "I can still feel the area and I do not think it is gone."  S/P [facility] with Dr. Fermin Schwab for paget's disease.  She states the area feels the same and is an intermittent pinching sensation in the same area.  "Feels like a piece of straw is poking me in that area."  Advised to keep her appt with Dr. Judeth Porch on Friday, Dec 9 or she could be sooner by another physician if her symptoms worsen.  She would like to keep appt with Dr. Fermin Schwab at this time and would like to talk with him about biopsying the area.  Advised to call for any needs or concerns.

## 2015-05-16 ENCOUNTER — Ambulatory Visit: Payer: BLUE CROSS/BLUE SHIELD | Attending: Gynecology | Admitting: Gynecology

## 2015-05-16 ENCOUNTER — Encounter: Payer: Self-pay | Admitting: Gynecology

## 2015-05-16 VITALS — BP 141/76 | HR 56 | Temp 98.3°F | Resp 18 | Ht 64.0 in | Wt 164.3 lb

## 2015-05-16 DIAGNOSIS — C519 Malignant neoplasm of vulva, unspecified: Secondary | ICD-10-CM | POA: Diagnosis not present

## 2015-05-16 DIAGNOSIS — C4499 Other specified malignant neoplasm of skin, unspecified: Secondary | ICD-10-CM | POA: Diagnosis not present

## 2015-05-16 NOTE — Progress Notes (Signed)
Consult Note: Gyn-Onc   [name] 64 y.o. female  Chief Complaint  Patient presents with  . Paget's disease of vulva    post-op check    Assessment : Paget's disease of the vulva having undergone recent wide local excision on 04/10/2015. Surgical margins were negative. Symptoms associated with postoperative wound healing. (There is no evidence of recurrent Paget's disease)  . Plan:   Patient's encouraged to massage her incision or GU stretch the scar. She will return to see me in January if she still has symptoms, otherwise we'll see her in 6 months for surveillance.  Interval History:   The patient underwent wide local excision of a persistent area of Paget's disease on the right anterior vulva on 04/10/2015. Pathology showed negative margins. She had a uncomplicated postoperative course. She does note she still has some symptoms in the area of the incision.Marland Kitchen  HPI:The patient initially underwent surgery for Paget's disease of the vulva in October 2011. There was focal involvement of the medial margin. The entire lesion is approximately 5 cm in diameter.   The patient previously had a supracervical hysterectomy but has her ovaries and cervix in situ.  She notes that she had some repair done at the time of her hysterectomy and at her introitus is tight and she has difficulty having intercourse and also has tearing. On the other hand her husband has had prostate cancer surgery making intercourse difficult as well.  Patient underwent wide local excision of a recurrent lesion on August 50,016 while all gross lesion was removed she did have positive surgical margins that were focal. Subsequently she underwent a another excision on 04/10/2015 with negative margins.     Review of Systems:10 point review of systems is negative except as noted in interval history.   Vitals: Blood pressure 141/76, pulse 56, temperature 98.3 F (36.8 C), temperature source Oral, resp. rate 18, height 5\' 4"   (1.626 m), weight 164 lb 4.8 oz (74.526 kg), SpO2 99 %.  Physical Exam: General : The patient is a healthy woman in no acute distress.  HEENT: normocephalic, extraoccular movements normal; neck is supple without thyromegally  Lynphnodes: Supraclavicular and inguinal nodes not enlarged  Abdomen: Soft, non-tender, no ascites, no organomegally, no masses, no hernias  Pelvic:  EGBUS: Normal female the recent scar from excision is healing well although slightly thickened especially at the apex. No new lesions are noted.   Vagina: Atrophic, no lesions  Urethra and Bladder: Normal, non-tender  Cervix: Atrophic Uterus: Surgically absent  Bi-manual examination: Non-tender; no adenxal masses or nodularity  Rectal: normal sphincter tone, no masses, no blood  Lower extremities: No edema or varicosities. Normal range of motion          Allergies  Allergen Reactions  . Ibuprofen Hypertension  . Naprosyn [Naproxen] Nausea Only  . Other     Allergic to toothpaste- makes mouth peel Hay fever/dust mites/trees "365 allergic"   . Azithromycin Itching and Rash  . Sulfa Antibiotics Hives and Rash    Past Medical History  Diagnosis Date  . Hypertension   . Headache(784.0)     Migraines  . Arthritis   . Paget's disease of vulva   . GERD (gastroesophageal reflux disease)   . History of adenomatous polyp of colon   . History of palpitations   . Allergic rhinitis   . History of concussion     AS CHILD--  NO RESIDUAL  . Wears glasses   . PONV (postoperative nausea and vomiting)  SEVERE    Past Surgical History  Procedure Laterality Date  . Simple vulvectomy  03-24-2010    right side  . Wisdom tooth extraction    . Pulley release right thumb  03-23-2011  . Pullery release left thumb, left ring finger/  excision mucoid tumor and debridement left ring finger joint  02-17-2011  . Vaginal hysterectomy  1989    and Anterior and posterior repair's for prolapse  . Tonsillectomy  1977   . Vulvectomy Right 01/10/2015    Procedure: RIGHT WIDE LOCAL EXCISION VULVECTOMY;  Surgeon: Maria Sleigh, MD;  Location: [facility];  Service: Gynecology;  Laterality: Right;  [name] Right 04/10/2015    Procedure: WIDE LOCAL EXCISION VULVA;  Surgeon: Maria Sleigh, MD;  Location: [facility];  Service: Gynecology;  Laterality: Right;    Current Outpatient Prescriptions  Medication Sig Dispense Refill  . albuterol (PROVENTIL HFA;VENTOLIN HFA) 108 (90 BASE) MCG/ACT inhaler Inhale 2 puffs into the lungs every 6 (six) hours as needed. For wheezing    . clobetasol ointment (TEMOVATE) [id number] % Apply 1 application topically 2 (two) times daily as needed. 15 g 2  . Dextromethorphan Polistirex (COUGH DM PO) Take by mouth as needed.    . fluticasone (CUTIVATE) 0.05 % cream Apply topically as needed.    . fluticasone (FLONASE) 50 MCG/ACT nasal spray Place 2 sprays into the nose daily as needed.     Marland Kitchen HYDROcodone-acetaminophen (NORCO/VICODIN) 5-325 MG tablet TAKE 1 TABLET BY MOUTH 4 TIMES DAILY AS NEEDED  0  . lansoprazole (PREVACID) 30 MG capsule Take 30 mg by mouth 2 (two) times daily.     Marland Kitchen losartan-hydrochlorothiazide (HYZAAR) 100-25 MG tablet TAKE 1 TABLET DAILY--  TAKES IN AM 90 tablet 3  . MELATONIN PO Take 1 tablet by mouth at bedtime as needed.     . metoprolol tartrate (LOPRESSOR) 25 MG tablet 6.25 mg po BID (Patient taking differently: Take 6.25 mg by mouth 2 (two) times daily. 6.25 mg po BID) 45 tablet 3  . montelukast (SINGULAIR) 10 MG tablet TAKE 1 TABLET EVERY MORNING 90 tablet 3   No current facility-administered medications for this visit.    Social History   Social History  . Marital Status: Married    Spouse Name: N/A  . Number of Children: N/A  . Years of Education: N/A   Occupational History  . Not on file.   Social History Main Topics  . Smoking status: Former Smoker -- 5 years    Types: Cigarettes    Quit date:  04/15/1989  . Smokeless tobacco: Never Used  . Alcohol Use: 0.6 oz/week    1 Glasses of wine per week     Comment: occasional wine  . Drug Use: No  . Sexual Activity: Not on file   Other Topics Concern  . Not on file   Social History Narrative    Family History  Problem Relation Age of Onset  . Cancer Maternal Grandmother   . Hypertension Father   . Heart disease Father   . Hypertension Mother   . Heart disease Mother   . Breast cancer Paternal Grandmother 57  . Colon cancer Paternal Pasty Spillers, MD 05/16/2015, 11:18 AM

## 2015-05-16 NOTE — Patient Instructions (Signed)
Apply Vitamin E to the healing incision on your vulva and massage gently to assisted with healing and scar formation.  Please call with an update and we can schedule you to see Dr. Fermin Schwab if Jan 2017 if you are still having symptoms.  If not, plan to see Dr. Fermin Schwab in June 2017.  Please call for any questions or concerns as well.

## 2015-05-26 ENCOUNTER — Encounter: Payer: Self-pay | Admitting: Internal Medicine

## 2015-05-28 ENCOUNTER — Encounter: Payer: Self-pay | Admitting: Internal Medicine

## 2015-07-03 ENCOUNTER — Encounter: Payer: BLUE CROSS/BLUE SHIELD | Admitting: Family Medicine

## 2015-07-21 ENCOUNTER — Ambulatory Visit (INDEPENDENT_AMBULATORY_CARE_PROVIDER_SITE_OTHER): Payer: BLUE CROSS/BLUE SHIELD | Admitting: Internal Medicine

## 2015-07-21 ENCOUNTER — Encounter: Payer: Self-pay | Admitting: Internal Medicine

## 2015-07-21 VITALS — BP 110/60 | HR 70 | Temp 97.8°F | Wt 167.0 lb

## 2015-07-21 DIAGNOSIS — J0100 Acute maxillary sinusitis, unspecified: Secondary | ICD-10-CM

## 2015-07-21 NOTE — Progress Notes (Signed)
Pre visit review using our clinic review tool, if applicable. No additional management support is needed unless otherwise documented below in the visit note. 

## 2015-07-21 NOTE — Assessment & Plan Note (Signed)
Discussed that this is almost certainly viral Discussed supportive care If worsens next week, would try empiric antibiotic

## 2015-07-21 NOTE — Progress Notes (Signed)
Subjective:    Patient ID: [name], female    DOB: August 05, 1950, 65 y.o.   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 albuterol (PROVENTIL HFA;VENTOLIN HFA) 108 (90 BASE) MCG/ACT inhaler Inhale 2 puffs into the lungs every 6 (six) hours as needed. For wheezing    . fluticasone (FLONASE) 50 MCG/ACT nasal spray Place 2 sprays into the nose daily as needed.     Marland Kitchen losartan-hydrochlorothiazide (HYZAAR) 100-25 MG tablet TAKE 1 TABLET DAILY--  TAKES IN AM 90 tablet 3  . metoprolol tartrate (LOPRESSOR) 25 MG tablet 6.25 mg po BID (Patient taking differently: Take 6.25 mg by mouth 2 (two) times daily. 6.25 mg po BID) 45 tablet 3  . montelukast (SINGULAIR) 10 MG tablet TAKE 1 TABLET EVERY MORNING 90 tablet 3   No current facility-administered medications on file prior to visit.    Allergies  Allergen Reactions  . Ibuprofen Hypertension  . Naprosyn [Naproxen] Nausea Only  . Other     Allergic to toothpaste- makes mouth peel Hay fever/dust mites/trees "365 allergic"   . Azithromycin Itching and Rash  . Sulfa Antibiotics Hives and Rash    Past Medical History  Diagnosis Date  . Hypertension   . Headache(784.0)     Migraines  . Arthritis   . Paget's disease of vulva   . GERD (gastroesophageal reflux disease)   . History of adenomatous polyp of colon   . History of palpitations   . Allergic rhinitis   . History of concussion     AS CHILD--  NO RESIDUAL  . Wears glasses   . PONV (postoperative nausea and vomiting)     SEVERE    Past Surgical History  Procedure Laterality Date  . Simple vulvectomy   03-24-2010    right side  . Wisdom tooth extraction    . Pulley release right thumb  03-23-2011  . Pullery release left thumb, left ring finger/  excision mucoid tumor and debridement left ring finger joint  02-17-2011  . Vaginal hysterectomy  1989    and Anterior and posterior repair's for prolapse  . Tonsillectomy  1977  . Vulvectomy Right 01/10/2015    Procedure: RIGHT WIDE LOCAL EXCISION VULVECTOMY;  Surgeon: [name], MD;  Location: [facility];  Service: Gynecology;  Laterality: Right;  [name] Right 04/10/2015    Procedure: WIDE LOCAL EXCISION VULVA;  Surgeon: [name], MD;  Location: [facility];  Service: Gynecology;  Laterality: Right;    Family History  Problem Relation Age of Onset  . Cancer Maternal Grandmother   . Hypertension Father   . Heart disease Father   . Hypertension Mother   . Heart disease Mother   . Breast cancer Paternal Grandmother 77  . Colon cancer Paternal Uncle     Social History   Social History  . Marital Status: Married    Spouse Name: N/A  . Number of Children: N/A  . Years of Education: N/A   Occupational History  . Not on file.   Social History Main Topics  . Smoking status: Former Smoker -- 5  years    Types: Cigarettes    Quit date: 04/15/1989  . Smokeless tobacco: Never Used  . Alcohol Use: 0.6 oz/week    1 Glasses of wine per week     Comment: occasional wine  . Drug Use: No  . Sexual Activity: Not on file   Other Topics Concern  . Not on file   Social History Narrative   Review of Systems No vomiting or diarrhea No rash Appetite is okay    Objective:   Physical Exam  Constitutional: She appears well-developed and well-nourished. No distress.  HENT:  Mouth/Throat: Oropharynx is clear and moist. No oropharyngeal exudate.  No sinus tenderness  TMs normal Mild nasal inflammation  Neck: Normal range of motion. Neck supple. No thyromegaly present.    Pulmonary/Chest: Effort normal and breath sounds normal. No respiratory distress. She has no wheezes. She has no rales.  Lymphadenopathy:    She has no cervical adenopathy.          Assessment & Plan:

## 2015-07-22 ENCOUNTER — Ambulatory Visit (AMBULATORY_SURGERY_CENTER): Payer: Self-pay | Admitting: *Deleted

## 2015-07-22 VITALS — Ht 64.0 in | Wt 166.2 lb

## 2015-07-22 DIAGNOSIS — Z86010 Personal history of colonic polyps: Secondary | ICD-10-CM

## 2015-07-22 NOTE — Progress Notes (Signed)
Denies allergies to eggs or soy products. Denies complications with sedation or anesthesia. Denies O2 use. Denies use of diet or weight loss medications.  Emmi instructions given for colonoscopy.  

## 2015-07-31 ENCOUNTER — Ambulatory Visit (AMBULATORY_SURGERY_CENTER): Payer: BLUE CROSS/BLUE SHIELD | Admitting: Internal Medicine

## 2015-07-31 ENCOUNTER — Encounter: Payer: Self-pay | Admitting: Internal Medicine

## 2015-07-31 VITALS — BP 130/72 | HR 49 | Temp 98.6°F | Resp 17 | Ht 64.0 in | Wt 166.0 lb

## 2015-07-31 DIAGNOSIS — Z86010 Personal history of colonic polyps: Secondary | ICD-10-CM

## 2015-07-31 MED ORDER — SODIUM CHLORIDE 0.9 % IV SOLN: 500.0000 mL | INTRAVENOUS | Status: DC

## 2015-07-31 NOTE — Patient Instructions (Signed)
YOU HAD AN ENDOSCOPIC PROCEDURE TODAY AT THE [facility]:   Refer to the procedure report that was given to you for any specific questions about what was found during the examination.  If the procedure report does not answer your questions, please call your gastroenterologist to clarify.  If you requested that your care partner not be given the details of your procedure findings, then the procedure report has been included in a sealed envelope for you to review at your convenience later.  YOU SHOULD EXPECT: Some feelings of bloating in the abdomen. Passage of more gas than usual.  Walking can help get rid of the air that was put into your GI tract during the procedure and reduce the bloating. If you had a lower endoscopy (such as a colonoscopy or flexible sigmoidoscopy) you may notice spotting of blood in your stool or on the toilet paper. If you underwent a bowel prep for your procedure, you may not have a normal bowel movement for a few days.  Please Note:  You might notice some irritation and congestion in your nose or some drainage.  This is from the oxygen used during your procedure.  There is no need for concern and it should clear up in a day or so.  SYMPTOMS TO REPORT IMMEDIATELY:   Following lower endoscopy (colonoscopy or flexible sigmoidoscopy):  Excessive amounts of blood in the stool  Significant tenderness or worsening of abdominal pains  Swelling of the abdomen that is new, acute  Fever of 100F or higher   For urgent or emergent issues, a gastroenterologist can be reached at any hour by calling [phone].   DIET: Your first meal following the procedure should be a small meal and then it is ok to progress to your normal diet. Heavy or fried foods are harder to digest and may make you feel nauseous or bloated.  Likewise, meals heavy in dairy and vegetables can increase bloating.  Drink plenty of fluids but you should avoid alcoholic beverages for 24  hours.  ACTIVITY:  You should plan to take it easy for the rest of today and you should NOT DRIVE or use heavy machinery until tomorrow (because of the sedation medicines used during the test).    FOLLOW UP: Our staff will call the number listed on your records the next business day following your procedure to check on you and address any questions or concerns that you may have regarding the information given to you following your procedure. If we do not reach you, we will leave a message.  However, if you are feeling well and you are not experiencing any problems, there is no need to return our call.  We will assume that you have returned to your regular daily activities without incident.  If any biopsies were taken you will be contacted by phone or by letter within the next 1-3 weeks.  Please call us at [phone] if you have not heard about the biopsies in 3 weeks.    SIGNATURES/CONFIDENTIALITY: You and/or your care partner have signed paperwork which will be entered into your electronic medical record.  These signatures attest to the fact that that the information above on your After Visit Summary has been reviewed and is understood.  Full responsibility of the confidentiality of this discharge information lies with you and/or your care-partner. 

## 2015-07-31 NOTE — Progress Notes (Signed)
Report to PACU, RN, vss, BBS= Clear.  

## 2015-07-31 NOTE — Op Note (Signed)
[facility]  [address] [place], [id number]   COLONOSCOPY PROCEDURE REPORT  PATIENT: [name], [name]  MR#: [id number] BIRTHDATE: 02-23-1951 , 37  yrs. old GENDER: female ENDOSCOPIST: Gatha Mayer, MD, [facility] PROCEDURE DATE:  07/31/2015 PROCEDURE:   Colonoscopy, surveillance First Screening Colonoscopy - Avg.  risk and is 50 yrs.  old or older - No.  Prior Negative Screening - Now for repeat screening. N/A  History of Adenoma - Now for follow-up colonoscopy & has been > or = to 3 yrs.  Yes hx of adenoma.  Has been 3 or more years since last colonoscopy.  Polyps removed today? No Recommend repeat exam, <10 yrs? No ASA CLASS:   Class II INDICATIONS:Surveillance due to prior colonic neoplasia, PH Colon Adenoma, and FH Colon or Rectal Adenocarcinoma. MEDICATIONS: Propofol 200 mg IV and Monitored anesthesia care  DESCRIPTION OF PROCEDURE:   After the risks benefits and alternatives of the procedure were thoroughly explained, informed consent was obtained.  The digital rectal exam revealed no abnormalities of the rectum.   The LB [id number]  endoscope was introduced through the anus and advanced to the cecum, which was identified by both the appendix and ileocecal valve. No adverse events experienced.   The quality of the prep was excellent. (MiraLax was used)  The instrument was then slowly withdrawn as the colon was fully examined. Estimated blood loss is zero unless otherwise noted in this procedure report.      COLON FINDINGS: A normal appearing cecum, ileocecal valve, and appendiceal orifice were identified.  The ascending, transverse, descending, sigmoid colon, and rectum appeared unremarkable. Retroflexed views revealed no abnormalities. The time to cecum = 4.1 Withdrawal time = 7.1   The scope was withdrawn and the procedure completed. COMPLICATIONS: There were no immediate complications.  ENDOSCOPIC IMPRESSION: Normal  colonoscopy  RECOMMENDATIONS: Repeat colonoscopy 10 years.  Remote small adenoma and fhx was in second degree relatives  eSigned:  Gatha Mayer, MD, [facility] 07/31/2015 1:53 PM   cc: The Patient

## 2015-08-01 ENCOUNTER — Telehealth: Payer: Self-pay | Admitting: Emergency Medicine

## 2015-08-01 NOTE — Telephone Encounter (Signed)
  Follow up Call-  Call back number 07/31/2015  Post procedure Call Back phone  # [phone]  Permission to leave phone message Yes     Patient questions:  Do you have a fever, pain , or abdominal swelling? No. Pain Score  0 *  Have you tolerated food without any problems? Yes.    Have you been able to return to your normal activities? Yes.    Do you have any questions about your discharge instructions: Diet   No. Medications  No. Follow up visit  No.  Do you have questions or concerns about your Care? No.  Actions: * If pain score is 4 or above: No action needed, pain <4.

## 2015-08-05 ENCOUNTER — Ambulatory Visit (INDEPENDENT_AMBULATORY_CARE_PROVIDER_SITE_OTHER): Payer: BLUE CROSS/BLUE SHIELD | Admitting: Family Medicine

## 2015-08-05 ENCOUNTER — Encounter: Payer: Self-pay | Admitting: Family Medicine

## 2015-08-05 VITALS — BP 120/72 | HR 60 | Temp 98.5°F | Ht 64.25 in | Wt 168.5 lb

## 2015-08-05 DIAGNOSIS — R059 Cough, unspecified: Secondary | ICD-10-CM

## 2015-08-05 DIAGNOSIS — J4520 Mild intermittent asthma, uncomplicated: Secondary | ICD-10-CM

## 2015-08-05 DIAGNOSIS — R05 Cough: Secondary | ICD-10-CM

## 2015-08-05 DIAGNOSIS — R053 Chronic cough: Secondary | ICD-10-CM | POA: Insufficient documentation

## 2015-08-05 MED ORDER — GUAIFENESIN-CODEINE 100-10 MG/5ML PO SYRP: 5.0000 mL | ORAL_SOLUTION | Freq: Every evening | ORAL | Status: DC | PRN

## 2015-08-05 MED ORDER — AMOXICILLIN 500 MG PO CAPS: 1000.0000 mg | ORAL_CAPSULE | Freq: Two times a day (BID) | ORAL | Status: DC

## 2015-08-05 NOTE — Progress Notes (Signed)
   Subjective:    Patient ID: [name], female    DOB: 1950/06/12, 65 y.o.   MRN: [id number]  Cough This is a new problem. The current episode started 1 to 4 weeks ago (> 2 weeks.). The problem has been unchanged. The problem occurs constantly. The cough is productive of sputum. Associated symptoms include nasal congestion and postnasal drip. Pertinent negatives include no ear congestion, ear pain, fever, headaches, myalgias, sore throat, shortness of breath or wheezing. The symptoms are aggravated by lying down. Risk factors: nonsmoker. Treatments tried: on singulair but ot use albuterol prn. The treatment provided mild relief. Her past medical history is significant for asthma. There is no history of environmental allergies.   Saw Dr. Silvio Pate on 2/13.. Treated for acute sinusitis, recurrent:  Felt likely viral. She reports having less sinus pressure but cough is contiuing,.   Social History /Family History/Past Medical History reviewed and updated if needed. Hx of reactive airway disease. On singulair and albuterol prn.   Review of Systems  Constitutional: Negative for fever.  HENT: Positive for postnasal drip. Negative for ear pain and sore throat.   Respiratory: Positive for cough. Negative for shortness of breath and wheezing.   Musculoskeletal: Negative for myalgias.  Allergic/Immunologic: Negative for environmental allergies.  Neurological: Negative for headaches.       Objective:   Physical Exam  Constitutional: Vital signs are normal. She appears well-developed and well-nourished. She is cooperative.  Non-toxic appearance. She does not appear ill. No distress.  HENT:  Head: Normocephalic.  Right Ear: Hearing, tympanic membrane, external ear and ear canal normal. Tympanic membrane is not erythematous, not retracted and not bulging.  Left Ear: Hearing, tympanic membrane, external ear and ear canal normal. Tympanic membrane is not erythematous, not retracted and not bulging.    Nose: Mucosal edema and rhinorrhea present. Right sinus exhibits no maxillary sinus tenderness and no frontal sinus tenderness. Left sinus exhibits no maxillary sinus tenderness and no frontal sinus tenderness.  Mouth/Throat: Uvula is midline, oropharynx is clear and moist and mucous membranes are normal.  Eyes: Conjunctivae, EOM and lids are normal. Pupils are equal, round, and reactive to light. Lids are everted and swept, no foreign bodies found.  Neck: Trachea normal and normal range of motion. Neck supple. Carotid bruit is not present. No thyroid mass and no thyromegaly present.  Cardiovascular: Normal rate, regular rhythm, S1 normal, S2 normal, normal heart sounds, intact distal pulses and normal pulses.  Exam reveals no gallop and no friction rub.   No murmur heard. Pulmonary/Chest: Effort normal and breath sounds normal. No tachypnea. No respiratory distress. She has no decreased breath sounds. She has no wheezes. She has no rhonchi. She has no rales.  Neurological: She is alert.  Skin: Skin is warm, dry and intact. No rash noted.  Psychiatric: Her speech is normal and behavior is normal. Judgment normal. Her mood appears not anxious. Cognition and memory are normal. She does not exhibit a depressed mood.          Assessment & Plan:

## 2015-08-05 NOTE — Progress Notes (Signed)
Pre visit review using our clinic review tool, if applicable. No additional management support is needed unless otherwise documented below in the visit note. 

## 2015-08-05 NOTE — Patient Instructions (Signed)
Cough suppressant at night as needed. Complete antibiotics.  Rest, fluids.  Mucinex DM during the day.  Continue singulair and use albuterol as needed for coughing fits.

## 2015-08-05 NOTE — Assessment & Plan Note (Signed)
>   2 weeks of symptoms, likely viral infection with bacterial superinfection. Treat with antibiotics.  NO current SOB wheeze, can use albuterol prn coughing fits.

## 2015-08-07 ENCOUNTER — Other Ambulatory Visit: Payer: BLUE CROSS/BLUE SHIELD

## 2015-08-14 ENCOUNTER — Encounter: Payer: BLUE CROSS/BLUE SHIELD | Admitting: Family Medicine

## 2015-08-22 ENCOUNTER — Telehealth: Payer: Self-pay

## 2015-08-22 MED ORDER — AMOXICILLIN-POT CLAVULANATE 875-125 MG PO TABS: 1.0000 | ORAL_TABLET | Freq: Two times a day (BID) | ORAL | Status: DC

## 2015-08-22 NOTE — Telephone Encounter (Signed)
Pt left v/m; pt last seen 08/05/15; 2 days ago started with sinus symptoms again; pt has finished abx from previous visit; head congestion, H/A and prod cough with clear phlegm but when blows her nose the mucus is yellow and blood tinged; having facial soreness.No S/T and no fever. pt request med to [place] request cb.

## 2015-08-22 NOTE — Telephone Encounter (Signed)
Will send in rx for broader antibiotics.. Augmentin. Let pt know please.

## 2015-08-23 ENCOUNTER — Encounter: Payer: Self-pay | Admitting: Family Medicine

## 2015-08-23 ENCOUNTER — Telehealth: Payer: Self-pay | Admitting: Family Medicine

## 2015-08-23 ENCOUNTER — Ambulatory Visit (INDEPENDENT_AMBULATORY_CARE_PROVIDER_SITE_OTHER): Payer: BLUE CROSS/BLUE SHIELD | Admitting: Family Medicine

## 2015-08-23 NOTE — Progress Notes (Unsigned)
Pre visit review using our clinic review tool, if applicable. No additional management support is needed unless otherwise documented below in the visit note. 

## 2015-08-23 NOTE — Telephone Encounter (Signed)
[facility]  Patient Name: [name]  DOB: [birth year]    Initial Comment Caller States bloody nasal discharge, cough, mucus in throat, headache, roof of mouth is sore.    Nurse Assessment  Nurse: Arnetha Courser, RN, Susie Date/Time (Eastern Time): 08/23/2015 8:41:21 AM  Confirm and document reason for call. If symptomatic, describe symptoms. You must click the next button to save text entered. ---Caller States nasal discharge that is bloody (bright pink) and is streaked in the mucus (yellowish to clear color); headache and has improved; roof of mouth is sore - no lesions felt with tongue; no fever; in [place] office two weeks; began as sinus infection in February and did improve; and now has worsened;  Has the patient traveled out of the country within the last 30 days? ---Not Applicable  Does the patient have any new or worsening symptoms? ---Yes  Will a triage be completed? ---Yes  Related visit to physician within the last 2 weeks? ---N/A  Does the PT have any chronic conditions? (i.e. diabetes, asthma, etc.) ---Yes  List chronic conditions. ---allergies;  Is this a behavioral health or substance abuse call? ---No     Guidelines    Guideline Title Affirmed Question Affirmed Notes  Sinus Pain or Congestion [1] SEVERE pain AND [2] not improved 2 hours after pain medicine    Final Disposition User   See Physician within 4 Hours (or PCP triage) [place], RN, Susie    Referrals  [facility]   Disagree/Comply: Comply

## 2015-08-25 NOTE — Telephone Encounter (Signed)
[facility] and was advised that she had a prescription at her local pharmacy for a stronger antibiotic. Patient chose at that time to go pick up the antibiotic and not be seen. Patient was advised to follow up with PCP if symptoms changed or worsened.

## 2015-08-29 ENCOUNTER — Telehealth: Payer: Self-pay

## 2015-08-29 NOTE — Telephone Encounter (Signed)
Pt left v/m; pt wanted to know about refill for flonase; I spoke with pt and pt is feeling better; still taking abx and since v/m was left pt found out can get flonase OTC. Pt will contnue med; if issues next week pt will cb.

## 2015-09-01 ENCOUNTER — Telehealth: Payer: Self-pay | Admitting: Family Medicine

## 2015-09-01 NOTE — Telephone Encounter (Signed)
PLEASE NOTE: All timestamps contained within this report are represented as [place] Standard Time. CONFIDENTIALTY NOTICE: This fax transmission is intended only for the addressee. It contains information that is legally privileged, confidential or otherwise protected from use or disclosure. If you are not the intended recipient, you are strictly prohibited from reviewing, disclosing, copying using or disseminating any of this information or taking any action in reliance on or regarding this information. If you have received this fax in error, please notify us immediately by telephone so that we can arrange for its return to [place]. Phone: [phone], Toll-Free: [phone], Fax: [phone] Page: 1 of 2 Call Id: [id number] [place] Patient Name: [name] Gender: Female DOB: 1950-08-28 Age: 65 Y 65 M 19 D Return Phone Number: [phone] (Primary), [phone] (Secondary) Address: City/State/Zip: [place] [address] Client [facility] - Client Client Site [place] - Day Physician Diona Browner, Amy Contact Type Call Who Is Calling Patient / Member / Family / Caregiver Call Type Triage / Clinical Relationship To Patient Self Return Phone Number [phone] (Primary) Chief Complaint Cough Reason for Call Symptomatic / Request for [place] states has been coughing for several weeks, has been to Dr twice, two rounds of ABX and on the last days of ABX thought she was getting better but started coughing again Appointment Disposition EMR Appointment Not Necessary Info pasted into Epic Yes PreDisposition Call Doctor Translation No Nurse Assessment Nurse: Raphael Gibney, RN, Vanita Ingles Date/Time (Eastern Time): 09/01/2015 11:53:37 AM Confirm and document reason for call. If symptomatic, describe symptoms. You must click the next  button to save text entered. ---Caller states she has cough for several weeks. The move she uses her voice, it makes it cough. She is on her last day of antibiotics. Has had 2 rounds of antibiotics. The first prescription was amoxicillin. She is taking amoxil Clav which has helped some. Did not sleep much last night. Coughing in the am last an hr. No fever. Has the patient traveled out of the country within the last 30 days? ---No Does the patient have any new or worsening symptoms? ---Yes Will a triage be completed? ---Yes Related visit to physician within the last 2 weeks? ---Yes Does the PT have any chronic conditions? (i.e. diabetes, asthma, etc.) ---Yes List chronic conditions. ---HTN Is this a behavioral health or substance abuse call? ---No Guidelines Guideline Title Affirmed Question Affirmed Notes Nurse Date/Time Eilene Ghazi Time) Sinus Infection on Antibiotic Follow-up Call [1] Reasonable improvement on Pryor Montes 09/01/2015 12:09:08 PM PLEASE NOTE: All timestamps contained within this report are represented as [place] Standard Time. CONFIDENTIALTY NOTICE: This fax transmission is intended only for the addressee. It contains information that is legally privileged, confidential or otherwise protected from use or disclosure. If you are not the intended recipient, you are strictly prohibited from reviewing, disclosing, copying using or disseminating any of this information or taking any action in reliance on or regarding this information. If you have received this fax in error, please notify us immediately by telephone so that we can arrange for its return to [place]. Phone: [phone], Toll-Free: [phone], Fax: [phone] Page: 2 of 2 Call Id: [id number] Guidelines Guideline Title Affirmed Question Affirmed Notes Nurse Date/Time Eilene Ghazi Time) antibiotic AND [2] no fever or pain (all triage questions negative) Disp. Time Eilene Ghazi Time) Disposition Final  User 09/01/2015 12:22:42 PM Home Care Yes Raphael Gibney, RN,  Doreatha Lew Understands: Yes Disagree/Comply: Comply Care Advice Given Per Guideline HOME CARE: You should be able to treat this at home. ANTIBIOTIC: Continue taking the prescribed antibiotic. * CONTAGIOUS PERIOD: Sinus infections are not contagious. You can return to school as soon as you feel well enough to perform normal activities. CALL BACK IF: * You become worse. CARE ADVICE given per Sinus Infection on Antibiotic Follow-Up Call (Adult) guideline.

## 2015-09-01 NOTE — Telephone Encounter (Signed)
Patient Name: [name]  DOB: Oct 26, 1950    Initial Comment Caller states has been coughing for several weeks, has been to Dr twice, two rounds of ABX and on the last days of ABX thought she was getting better but started coughing again    Nurse Assessment  Nurse: Raphael Gibney, RN, Vanita Ingles Date/Time ([place] Time): 09/01/2015 11:53:37 AM  Confirm and document reason for call. If symptomatic, describe symptoms. You must click the next button to save text entered. ---Caller states she has cough for several weeks. The move she uses her voice, it makes it cough. She is on her last day of antibiotics. Has had 2 rounds of antibiotics. The first prescription was amoxicillin. She is taking amoxil Clav which has helped some. Did not sleep much last night. Coughing in the am last an hr. No fever.  Has the patient traveled out of the country within the last 30 days? ---No  Does the patient have any new or worsening symptoms? ---Yes  Will a triage be completed? ---Yes  Related visit to physician within the last 2 weeks? ---Yes  Does the PT have any chronic conditions? (i.e. diabetes, asthma, etc.) ---Yes  List chronic conditions. ---HTN  Is this a behavioral health or substance abuse call? ---No     Guidelines    Guideline Title Affirmed Question Affirmed Notes  Sinus Infection on Antibiotic Follow-up Call [1] Reasonable improvement on antibiotic AND [2] no fever or pain (all triage questions negative)    Final Disposition User   [place], RN, Vanita Ingles    Disagree/Comply: Comply

## 2015-09-01 NOTE — Telephone Encounter (Signed)
Left message asking pt to call office Received [employer] paperwork needing additional information

## 2015-09-02 NOTE — Telephone Encounter (Signed)
Spoke with wanda at [facility] All they need is office notes  Faxed last 2 office notes to [employer]

## 2015-09-04 NOTE — Telephone Encounter (Signed)
Pt called stating [employer] needs phone notes for march Pt aware notes were faxed

## 2015-10-24 ENCOUNTER — Ambulatory Visit: Payer: BLUE CROSS/BLUE SHIELD | Attending: Gynecology | Admitting: Gynecology

## 2015-10-24 VITALS — BP 133/83 | HR 60 | Temp 98.1°F | Resp 18 | Ht 64.25 in | Wt 171.2 lb

## 2015-10-24 DIAGNOSIS — Z90710 Acquired absence of both cervix and uterus: Secondary | ICD-10-CM | POA: Insufficient documentation

## 2015-10-24 DIAGNOSIS — K219 Gastro-esophageal reflux disease without esophagitis: Secondary | ICD-10-CM | POA: Insufficient documentation

## 2015-10-24 DIAGNOSIS — I10 Essential (primary) hypertension: Secondary | ICD-10-CM | POA: Insufficient documentation

## 2015-10-24 DIAGNOSIS — Z87891 Personal history of nicotine dependence: Secondary | ICD-10-CM | POA: Insufficient documentation

## 2015-10-24 DIAGNOSIS — G43909 Migraine, unspecified, not intractable, without status migrainosus: Secondary | ICD-10-CM | POA: Diagnosis not present

## 2015-10-24 DIAGNOSIS — M1990 Unspecified osteoarthritis, unspecified site: Secondary | ICD-10-CM | POA: Insufficient documentation

## 2015-10-24 DIAGNOSIS — Z8544 Personal history of malignant neoplasm of other female genital organs: Secondary | ICD-10-CM | POA: Diagnosis not present

## 2015-10-24 DIAGNOSIS — J309 Allergic rhinitis, unspecified: Secondary | ICD-10-CM | POA: Diagnosis not present

## 2015-10-24 DIAGNOSIS — C519 Malignant neoplasm of vulva, unspecified: Secondary | ICD-10-CM | POA: Diagnosis present

## 2015-10-24 DIAGNOSIS — C4499 Other specified malignant neoplasm of skin, unspecified: Secondary | ICD-10-CM

## 2015-10-24 DIAGNOSIS — R002 Palpitations: Secondary | ICD-10-CM | POA: Insufficient documentation

## 2015-10-24 NOTE — Progress Notes (Signed)
Consult Note: Gyn-Onc   [name] 65 y.o. female  Chief Complaint  Patient presents with  . Follow-up  . Paget's disease of vulva    Assessment : Paget's disease of the vulva Clinically free of disease. Agglutination of the clitoral hood consistent with lichen sclerosus. . Plan:   We will see her in 6 months for continued surveillance.  Interval History:   Since her last visit the patient's done well. She denies any vulvar symptoms. She has some questions about the agglutination of her clitoral hood. She has not noticed any new lesions. She denies any vaginal discharge bleeding or pelvic pain.  Unfortunately her mother died of cardiac disease recently.  HPI:The patient initially underwent surgery for Paget's disease of the vulva in October 2011. There was focal involvement of the medial margin. The entire lesion is approximately 5 cm in diameter.   The patient previously had a supracervical hysterectomy but has her ovaries and cervix in situ.  She notes that she had some repair done at the time of her hysterectomy and at her introitus is tight and she has difficulty having intercourse and also has tearing. On the other hand her husband has had prostate cancer surgery making intercourse difficult as well.  Patient underwent wide local excision of a recurrent lesion on August 50,016 while all gross lesion was removed she did have positive surgical margins that were focal. Subsequently she underwent a another excision on 04/10/2015 with negative margins.     Review of Systems:10 point review of systems is negative except as noted in interval history.   Vitals: Blood pressure 133/83, pulse 60, temperature 98.1 F (36.7 C), temperature source Oral, resp. rate 18, height 5' 4.25" (1.632 m), weight 171 lb 3.2 oz (77.656 kg), SpO2 98 %.  Physical Exam: General : The patient is a healthy woman in no acute distress.  HEENT: normocephalic, extraoccular movements normal; neck is supple  without thyromegally  Lynphnodes: Supraclavicular and inguinal nodes not enlarged  Abdomen: Soft, non-tender, no ascites, no organomegally, no masses, no hernias  Pelvic:  EGBUS: Normal female the recent scar from excision is healing well although slightly thickened especially at the apex. No new lesions are noted.   Vagina: Atrophic, no lesions  Urethra and Bladder: Normal, non-tender  Cervix: Atrophic Uterus: Surgically absent  Bi-manual examination: Non-tender; no adenxal masses or nodularity  Rectal: normal sphincter tone, no masses, no blood  Lower extremities: No edema or varicosities. Normal range of motion          Allergies  Allergen Reactions  . Ibuprofen Hypertension  . Naprosyn [Naproxen] Nausea Only  . Other     Allergic to toothpaste- makes mouth peel Hay fever/dust mites/trees "365 allergic"   . Azithromycin Itching and Rash  . Sulfa Antibiotics Hives and Rash    Past Medical History  Diagnosis Date  . Hypertension   . Headache(784.0)     Migraines  . Arthritis   . Paget's disease of vulva   . GERD (gastroesophageal reflux disease)   . History of adenomatous polyp of colon   . History of palpitations   . Allergic rhinitis   . History of concussion     AS CHILD--  NO RESIDUAL  . Wears glasses   . PONV (postoperative nausea and vomiting)     SEVERE    Past Surgical History  Procedure Laterality Date  . Simple vulvectomy  03-24-2010    right side  . Wisdom tooth extraction    .  Pulley release right thumb  03-23-2011  . Pullery release left thumb, left ring finger/  excision mucoid tumor and debridement left ring finger joint  02-17-2011  . Vaginal hysterectomy  1989    and Anterior and posterior repair's for prolapse  . Tonsillectomy  1977  . Vulvectomy Right 01/10/2015    Procedure: RIGHT WIDE LOCAL EXCISION VULVECTOMY;  Surgeon: [name], MD;  Location: [facility];  Service: Gynecology;  Laterality: Right;  [name] Right 04/10/2015    Procedure: WIDE LOCAL EXCISION VULVA;  Surgeon: [name], MD;  Location: [facility];  Service: Gynecology;  Laterality: Right;  . Trigger finger release    . Mucoid cyst removal thumb      Current Outpatient Prescriptions  Medication Sig Dispense Refill  . fluticasone (FLONASE) 50 MCG/ACT nasal spray Place 2 sprays into the nose daily as needed.     . lansoprazole (PREVACID) 30 MG capsule Take 30 mg by mouth 2 (two) times daily before a meal.     . losartan-hydrochlorothiazide (HYZAAR) 100-25 MG tablet TAKE 1 TABLET DAILY--  TAKES IN AM 90 tablet 3  . metoprolol tartrate (LOPRESSOR) 25 MG tablet 6.25 mg po BID (Patient taking differently: Take 6.25 mg by mouth 2 (two) times daily. 6.25 mg po BID) 45 tablet 3  . montelukast (SINGULAIR) 10 MG tablet TAKE 1 TABLET EVERY MORNING 90 tablet 3  . albuterol (PROVENTIL HFA;VENTOLIN HFA) 108 (90 BASE) MCG/ACT inhaler Inhale 2 puffs into the lungs every 6 (six) hours as needed. Reported on 10/24/2015    . diphenhydramine-acetaminophen (TYLENOL PM) 25-500 MG TABS tablet Take 1 tablet by mouth at bedtime as needed. Reported on 10/24/2015     No current facility-administered medications for this visit.    Social History   Social History  . Marital Status: Married    Spouse Name: N/A  . Number of Children: N/A  . Years of Education: N/A   Occupational History  . Not on file.   Social History Main Topics  . Smoking status: Former Smoker -- 5 years    Types: Cigarettes    Quit date: 04/15/1989  . Smokeless tobacco: Never Used  . Alcohol Use: 0.6 oz/week    1 Glasses of wine per week     Comment: occasional wine  . Drug Use: No  . Sexual Activity: Not on file   Other Topics Concern  . Not on file   Social History Narrative    Family History  Problem Relation Age of Onset  . Cancer Maternal Grandmother   . Hypertension Father   . Heart disease Father   . Hypertension Mother    . Heart disease Mother   . Breast cancer Paternal Grandmother 66  . Colon cancer Paternal Pasty Spillers, MD 10/24/2015, 2:10 PM

## 2015-10-24 NOTE — Patient Instructions (Signed)
Plan to follow up in November or sooner if symptoms arise.  Please call for any needs.

## 2016-02-23 ENCOUNTER — Telehealth: Payer: Self-pay | Admitting: Family Medicine

## 2016-02-23 NOTE — Telephone Encounter (Signed)
Please call and schedule CPE with fasting labs for Dr. Diona Browner for sometime after 03/12/2016.

## 2016-02-26 NOTE — Telephone Encounter (Signed)
Left message asking pt to call office  °

## 2016-02-26 NOTE — Telephone Encounter (Signed)
Appointment 11/3 Pt stated she thought she  was in between [place] and [place] this year for her cpx.  She wanted to make sure insurance will pay please advise pt  Best number [phone]

## 2016-02-26 NOTE — Telephone Encounter (Signed)
Left message for [name] that her CPE scheduled for 04/09/16 should be covered by her insurance.

## 2016-03-12 ENCOUNTER — Other Ambulatory Visit: Payer: Self-pay | Admitting: Family Medicine

## 2016-03-19 ENCOUNTER — Other Ambulatory Visit: Payer: Self-pay | Admitting: Family Medicine

## 2016-04-09 ENCOUNTER — Encounter: Payer: Self-pay | Admitting: Gynecology

## 2016-04-09 ENCOUNTER — Ambulatory Visit: Payer: BLUE CROSS/BLUE SHIELD | Attending: Gynecology | Admitting: Gynecology

## 2016-04-09 ENCOUNTER — Ambulatory Visit (INDEPENDENT_AMBULATORY_CARE_PROVIDER_SITE_OTHER): Payer: BLUE CROSS/BLUE SHIELD | Admitting: Family Medicine

## 2016-04-09 ENCOUNTER — Encounter: Payer: Self-pay | Admitting: Family Medicine

## 2016-04-09 VITALS — BP 120/70 | HR 64 | Temp 98.4°F | Ht 64.25 in | Wt 185.5 lb

## 2016-04-09 VITALS — BP 128/60 | HR 56 | Temp 98.1°F | Resp 18 | Ht 64.25 in | Wt 185.0 lb

## 2016-04-09 DIAGNOSIS — Z0000 Encounter for general adult medical examination without abnormal findings: Secondary | ICD-10-CM

## 2016-04-09 DIAGNOSIS — M1990 Unspecified osteoarthritis, unspecified site: Secondary | ICD-10-CM | POA: Diagnosis not present

## 2016-04-09 DIAGNOSIS — I10 Essential (primary) hypertension: Secondary | ICD-10-CM

## 2016-04-09 DIAGNOSIS — Z803 Family history of malignant neoplasm of breast: Secondary | ICD-10-CM | POA: Insufficient documentation

## 2016-04-09 DIAGNOSIS — Z886 Allergy status to analgesic agent status: Secondary | ICD-10-CM | POA: Insufficient documentation

## 2016-04-09 DIAGNOSIS — Z2911 Encounter for prophylactic immunotherapy for respiratory syncytial virus (RSV): Secondary | ICD-10-CM | POA: Diagnosis not present

## 2016-04-09 DIAGNOSIS — Z9109 Other allergy status, other than to drugs and biological substances: Secondary | ICD-10-CM | POA: Diagnosis not present

## 2016-04-09 DIAGNOSIS — G43009 Migraine without aura, not intractable, without status migrainosus: Secondary | ICD-10-CM | POA: Diagnosis not present

## 2016-04-09 DIAGNOSIS — C519 Malignant neoplasm of vulva, unspecified: Secondary | ICD-10-CM | POA: Diagnosis not present

## 2016-04-09 DIAGNOSIS — Z23 Encounter for immunization: Secondary | ICD-10-CM

## 2016-04-09 DIAGNOSIS — J309 Allergic rhinitis, unspecified: Secondary | ICD-10-CM

## 2016-04-09 DIAGNOSIS — K219 Gastro-esophageal reflux disease without esophagitis: Secondary | ICD-10-CM | POA: Insufficient documentation

## 2016-04-09 DIAGNOSIS — Z90710 Acquired absence of both cervix and uterus: Secondary | ICD-10-CM | POA: Diagnosis not present

## 2016-04-09 DIAGNOSIS — Z800 Family history of malignant neoplasm of digestive organs: Secondary | ICD-10-CM | POA: Insufficient documentation

## 2016-04-09 DIAGNOSIS — Z8544 Personal history of malignant neoplasm of other female genital organs: Secondary | ICD-10-CM

## 2016-04-09 DIAGNOSIS — Z87891 Personal history of nicotine dependence: Secondary | ICD-10-CM | POA: Insufficient documentation

## 2016-04-09 DIAGNOSIS — C4499 Other specified malignant neoplasm of skin, unspecified: Secondary | ICD-10-CM

## 2016-04-09 DIAGNOSIS — Z1159 Encounter for screening for other viral diseases: Secondary | ICD-10-CM

## 2016-04-09 DIAGNOSIS — Z881 Allergy status to other antibiotic agents status: Secondary | ICD-10-CM | POA: Diagnosis not present

## 2016-04-09 DIAGNOSIS — Z882 Allergy status to sulfonamides status: Secondary | ICD-10-CM | POA: Insufficient documentation

## 2016-04-09 DIAGNOSIS — N9089 Other specified noninflammatory disorders of vulva and perineum: Secondary | ICD-10-CM

## 2016-04-09 DIAGNOSIS — Z8249 Family history of ischemic heart disease and other diseases of the circulatory system: Secondary | ICD-10-CM | POA: Insufficient documentation

## 2016-04-09 LAB — LIPID PANEL
CHOL/HDL RATIO: 3
Cholesterol: 245 mg/dL — ABNORMAL HIGH (ref 0–200)
HDL: 94.4 mg/dL (ref >39.00)
LDL Cholesterol: 134 mg/dL — ABNORMAL HIGH (ref 0–99)
NONHDL: 150.34
Triglycerides: 80 mg/dL (ref 0.0–149.0)
VLDL: 16 mg/dL (ref 0.0–40.0)

## 2016-04-09 LAB — COMPREHENSIVE METABOLIC PANEL
ALK PHOS: 47 U/L (ref 39–117)
ALT: 26 U/L (ref 0–35)
AST: 25 U/L (ref 0–37)
Albumin: 4.3 g/dL (ref 3.5–5.2)
BILIRUBIN TOTAL: 0.3 mg/dL (ref 0.2–1.2)
BUN: 12 mg/dL (ref 6–23)
CO2: 30 mEq/L (ref 19–32)
Calcium: 9.6 mg/dL (ref 8.4–10.5)
Chloride: 103 mEq/L (ref 96–112)
Creatinine, Ser: 0.92 mg/dL (ref 0.40–1.20)
GFR: 65.48 mL/min (ref >60.00)
GLUCOSE: 90 mg/dL (ref 70–99)
POTASSIUM: 4.1 meq/L (ref 3.5–5.1)
SODIUM: 141 meq/L (ref 135–145)
Total Protein: 7.2 g/dL (ref 6.0–8.3)

## 2016-04-09 NOTE — Assessment & Plan Note (Signed)
Well controlled. Continue current medication. Encouraged exercise, weight loss, healthy eating habits.  

## 2016-04-09 NOTE — Addendum Note (Signed)
Addended by: Eliezer Lofts E on: 04/09/2016 09:21 AM   Modules accepted: Orders

## 2016-04-09 NOTE — Patient Instructions (Signed)
Plan to follow up in 3 months , call with any changes ,questions or concerns.  Thank you

## 2016-04-09 NOTE — Addendum Note (Signed)
Addended by: Carter Kitten on: 04/09/2016 09:35 AM   Modules accepted: Orders

## 2016-04-09 NOTE — Assessment & Plan Note (Signed)
No issues

## 2016-04-09 NOTE — Assessment & Plan Note (Signed)
Followed by GYN

## 2016-04-09 NOTE — Patient Instructions (Addendum)
Stop at lab on way out.   Work on healthy sleep hygeine and try sleep training.  Call if sleep issues continuing.  Try to work on exercise earlier in day 150 min per week.

## 2016-04-09 NOTE — Progress Notes (Signed)
65 year old female presents for follow up and annual medications refill.  Saw GYN (Dr. Aldean Ast) for Paget's disease of vulva, s/p excision in 2011... Possible recurrence.  Has appt today.   Hypertension: Well controlled on losartan and lopressor.  BP Readings from Last 3 Encounters:  04/09/16 120/70  10/24/15 133/83  08/23/15 124/82  Using medication without problems or lightheadedness: None  Chest pain with exertion: None  Edema:None  Short of breath: None Average home BPs: well controlled Other issues:  She is working on exercise and weight loss.   Allergic rhinitits: Well controlled don fluticasone nasal.  No need for albuterol inhaler.  Migraine: none in last year.   Some trouble sleeping at night.  Mother passed away in November 24, 2022. Grieving. Occ using unisom.   Due for chol and DM screen.   Social History /Family History/Past Medical History reviewed and updated if needed.  Review of Systems  Constitutional: Negative for fever, fatigue and unexpected weight change.  HENT: Negative for ear pain, congestion, sore throat, sneezing, trouble swallowing and sinus pressure.  Eyes: Negative for pain and itching.  Respiratory: No SOB, recennt sinus infecitons. Cardiovascular: Negative for chest pain, palpitations and leg swelling.  Gastrointestinal: Negative for nausea, abdominal pain, diarrhea, constipation and blood in stool.  Genitourinary: Negative for dysuria, hematuria, vaginal discharge, difficulty urinating and menstrual problem.  Musculoskeletal: Skin: Negative for rash.  Neurological: Negative for syncope, weakness, light-headedness, numbness and headaches.  Psychiatric/Behavioral: Negative for confusion and dysphoric mood. The patient is not nervous/anxious.  Objective:   Physical Exam  Constitutional: Vital signs are normal. She appears well-developed and well-nourished. She is cooperative. Non-toxic appearance. She does not appear ill. No  distress.  HENT:  Head: Normocephalic.  Right Ear: Hearing, tympanic membrane, external ear and ear canal normal.  Left Ear: Hearing, tympanic membrane, external ear and ear canal normal.  Nose: Nose normal.  Eyes: Conjunctivae, EOM and lids are normal. Pupils are equal, round, and reactive to light. No foreign bodies found.  Neck: Trachea normal and normal range of motion. Neck supple. Carotid bruit is not present. No mass and no thyromegaly present.  Cardiovascular: Normal rate, regular rhythm, S1 normal, S2 normal, normal heart sounds and intact distal pulses. Exam reveals no gallop.  No murmur heard.  Pulmonary/Chest: Effort normal and breath sounds normal. No respiratory distress. She has no wheezes. She has no rhonchi. She has no rales.  Abdominal: Soft. Normal appearance and bowel sounds are normal. She exhibits no distension, no fluid wave, no abdominal bruit and no mass. There is no hepatosplenomegaly. There is no tenderness. There is no rebound, no guarding and no CVA tenderness. No hernia.  Lymphadenopathy:  She has no cervical adenopathy.  She has no axillary adenopathy.  Neurological: She is alert. She has normal strength. No cranial nerve deficit or sensory deficit.  Skin: Skin is warm, dry and intact. No rash noted.  Psychiatric: Her speech is normal and behavior is normal. Judgment normal. Her mood appears not anxious. Cognition and memory are normal. She does not exhibit a depressed mood.  Assessment & Plan:   CPX: The patient's preventative maintenance and recommended screening tests for an annual wellness exam were reviewed in full today.  Brought up to date unless services declined.  Counselled on the importance of diet, exercise, and its role in overall health and mortality.  The patient's FH and SH was reviewed, including their home life, tobacco status, and drug and alcohol status.   Vaccines: Uptodate with Tdap,  flu and shingles given. PAP/DVE:  Followed by GYN. Mammo: 03/2015 nml  Colon: nml 2017 Dr. Carlean Purl, repeat in 10 years. Hep C : due

## 2016-04-09 NOTE — Progress Notes (Signed)
Pre visit review using our clinic review tool, if applicable. No additional management support is needed unless otherwise documented below in the visit note. 

## 2016-04-09 NOTE — Assessment & Plan Note (Signed)
Well controlled on flonase.

## 2016-04-09 NOTE — Progress Notes (Signed)
Consult Note: Gyn-Onc   [name] 65 y.o. female  Chief Complaint  Patient presents with  . paget's disease of vulva    follow up    Assessment : Paget's disease of the vulva Clinically free of disease. Despite fake symptoms, unable to identify any lesions.   Plan:  The patient is urged to use some topical steroids if the symptoms are bothering her. If her symptoms worsen she will return to see me sooner. Otherwise, we will see her in 3 months for continued surveillance.  Interval History:   Since her last visit the patientHas noted a vague "fluttery" feeling in the left anterior vulva in the morning when she first wakes up. Otherwise she does not feel it throughout the remainder of the day. She has not been using any steroids on this area. She feels that this may represent a recurrence of her Paget's disease. HPI:The patient initially underwent surgery for Paget's disease of the vulva in October 2011. There was focal involvement of the medial margin. The entire lesion is approximately 5 cm in diameter.   The patient previously had a supracervical hysterectomy but has her ovaries and cervix in situ.  She notes that she had some repair done at the time of her hysterectomy and at her introitus is tight and she has difficulty having intercourse and also has tearing. On the other hand her husband has had prostate cancer surgery making intercourse difficult as well.  Patient underwent wide local excision of a recurrent lesion on August 50,016 while all gross lesion was removed she did have positive surgical margins that were focal. Subsequently she underwent a another excision on 04/10/2015 with negative margins.     Review of Systems:10 point review of systems is negative except as noted in interval history.   Vitals: Blood pressure 128/60, pulse (!) 56, temperature 98.1 F (36.7 C), temperature source Oral, resp. rate 18, height 5' 4.25" (1.632 m), weight 185 lb (83.9 kg), SpO2 97  %.  Physical Exam: General : The patient is a healthy woman in no acute distress.  HEENT: normocephalic, extraoccular movements normal; neck is supple without thyromegally  Lynphnodes: Supraclavicular and inguinal nodes not enlarged  Abdomen: Soft, non-tender, no ascites, no organomegally, no masses, no hernias  Pelvic:  EGBUS: Normal female her as a defect in the right vulva consistent with prior excision. Careful inspection of the entire remainder the vulva including the left side where she feels symptoms show no new lesions are noted.     Lower extremities: No edema or varicosities. Normal range of motion          Allergies  Allergen Reactions  . Ibuprofen Hypertension  . Naprosyn [Naproxen] Nausea Only  . Other     Allergic to toothpaste- makes mouth peel Hay fever/dust mites/trees "365 allergic"   . Azithromycin Itching and Rash  . Sulfa Antibiotics Hives and Rash    Past Medical History:  Diagnosis Date  . Allergic rhinitis   . Arthritis   . GERD (gastroesophageal reflux disease)   . Headache(784.0)    Migraines  . History of adenomatous polyp of colon   . History of concussion    AS CHILD--  NO RESIDUAL  . History of palpitations   . Hypertension   . Paget's disease of vulva   . PONV (postoperative nausea and vomiting)    SEVERE  . Wears glasses     Past Surgical History:  Procedure Laterality Date  . mucoid cyst removal thumb    .  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 PULLEY RELEASE RIGHT THUMB  03-23-2011  . SIMPLE VULVECTOMY  03-24-2010   right side  . TONSILLECTOMY  1977  . TRIGGER FINGER RELEASE    . VAGINAL HYSTERECTOMY  1989   and Anterior and posterior repair's for prolapse  . VULVECTOMY Right 01/10/2015   Procedure: RIGHT WIDE LOCAL EXCISION VULVECTOMY;  Surgeon: [name], MD;  Location[facility];  Service: Gynecology;   Laterality: Right;  Marland Kitchen VULVECTOMY Right 04/10/2015   Procedure: WIDE LOCAL EXCISION VULVA;  Surgeon: [name], MD;  Location: [facility];  Service: Gynecology;  Laterality: Right;  . WISDOM TOOTH EXTRACTION      Current Outpatient Prescriptions  Medication Sig Dispense Refill  . doxylamine, Sleep, (UNISOM) 25 MG tablet Take 25 mg by mouth at bedtime as needed.    . lansoprazole (PREVACID) 30 MG capsule Take 30 mg by mouth 2 (two) times daily before a meal.     . losartan-hydrochlorothiazide (HYZAAR) 100-25 MG tablet TAKE 1 TABLET DAILY IN THE MORNING 90 tablet 0  . metoprolol tartrate (LOPRESSOR) 25 MG tablet TAKE ONE-FOURTH TABLET (6.25 MG) TWICE A DAY (NEEDS OFFICE VISIT PRIOR TO ANY ADDITIONAL REFILLS) 45 tablet 0  . montelukast (SINGULAIR) 10 MG tablet TAKE 1 TABLET EVERY MORNING 90 tablet 0  . Triamcinolone & Emollient (DERMASORB TA) 0.1 % KIT   0  . albuterol (PROVENTIL HFA;VENTOLIN HFA) 108 (90 BASE) MCG/ACT inhaler Inhale 2 puffs into the lungs every 6 (six) hours as needed. Reported on 10/24/2015    . fluticasone (FLONASE) 50 MCG/ACT nasal spray Place 2 sprays into the nose daily as needed.      No current facility-administered medications for this visit.     Social History   Social History  . Marital status: Married    Spouse name: N/A  . Number of children: N/A  . Years of education: N/A   Occupational History  . Not on file.   Social History Main Topics  . Smoking status: Former Smoker    Years: 5.00    Types: Cigarettes    Quit date: 04/15/1989  . Smokeless tobacco: Never Used  . Alcohol use 0.6 oz/week    1 Glasses of wine per week     Comment: occasional wine  . Drug use: No  . Sexual activity: Not on file   Other Topics Concern  . Not on file   Social History Narrative  . No narrative on file    Family History  Problem Relation Age of Onset  . Cancer Maternal Grandmother   . Hypertension Father   . Heart disease Father    . Hypertension Mother   . Heart disease Mother   . Breast cancer Paternal Grandmother 42  . Colon cancer Paternal Uncle       [name], MD 04/09/2016, 1:36 PM

## 2016-04-10 LAB — HEPATITIS C ANTIBODY: HCV AB: NEGATIVE

## 2016-04-12 ENCOUNTER — Telehealth: Payer: Self-pay

## 2016-04-12 NOTE — Telephone Encounter (Signed)
PLEASE NOTE: All timestamps contained within this report are represented as [place] Standard Time. CONFIDENTIALTY NOTICE: This fax transmission is intended only for the addressee. It contains information that is legally privileged, confidential or otherwise protected from use or disclosure. If you are not the intended recipient, you are strictly prohibited from reviewing, disclosing, copying using or disseminating any of this information or taking any action in reliance on or regarding this information. If you have received this fax in error, please notify us immediately by telephone so that we can arrange for its return to [place]. Phone: [phone], Toll-Free: [phone], Fax: [phone] Page: 1 of 2 Call Id: [id number] [place] Patient Name: [name] Gender: Female DOB: [birth year] Age: 65 Y 2 M 29 D Return Phone Number: [phone] (Primary), [phone] (Secondary) Address: City/State/Zip: [place] Client [place] Night - Client Client Site [place] Physician Eliezer Lofts - MD Contact Type Call Who Is Calling Patient / Member / Family / Caregiver Call Type Triage / Clinical Relationship To Patient Self Return Phone Number [phone] (Primary) Chief Complaint Immunization Reaction Reason for Call Symptomatic / Request for Health Information Initial Comment Caller States had Shingles shot and has redness, sensitive, and swollen and warm PreDisposition Call Doctor Translation No Nurse Assessment Nurse: Denyse Amass, RN, Benjamine Mola Date/Time (Eastern Time): 04/11/2016 8:32:26 PM Confirm and document reason for call. If symptomatic, describe symptoms. You must click the next button to save text entered. ---Patient states she go a shingle shot 2 days ago on her right upper arm and she has some redness and soreness at the site. States  it feels like a hard lump and it feels warm. Has the patient traveled out of the country within the last 30 days? ---Not Applicable Does the patient have any new or worsening symptoms? ---Yes Will a triage be completed? ---Yes Related visit to physician within the last 2 weeks? ---No Does the PT have any chronic conditions? (i.e. diabetes, asthma, etc.) ---Yes List chronic conditions. ---HTN GERD Is this a behavioral health or substance abuse call? ---No Guidelines Guideline Title Affirmed Question Affirmed Notes Nurse Date/Time (Eastern Time) Immunization Reactions [1] Redness or red streak around the injection site AND [2] begins > 48 hours after shot AND [3] no fever (Exception: red area < 1 inch or 2.5 cm wide) [place], RN, Benjamine Mola 04/11/2016 8:35:56 PM Disp. Time Eilene Ghazi Time) Disposition Final User PLEASE NOTE: All timestamps contained within this report are represented as [place] Standard Time. CONFIDENTIALTY NOTICE: This fax transmission is intended only for the addressee. It contains information that is legally privileged, confidential or otherwise protected from use or disclosure. If you are not the intended recipient, you are strictly prohibited from reviewing, disclosing, copying using or disseminating any of this information or taking any action in reliance on or regarding this information. If you have received this fax in error, please notify us immediately by telephone so that we can arrange for its return to [place]. Phone: [phone], Toll-Free: [phone], Fax: [phone] Page: 2 of 2 Call Id: [id number] 04/11/2016 8:40:07 PM See Physician within 24 Hours Yes [place], RN, Lorin Glass Understands: Yes Disagree/Comply: Comply Care Advice Given Per Guideline SEE PHYSICIAN WITHIN 24 HOURS: * IF OFFICE WILL BE OPEN: You need to be seen within the next 24 hours. Call your doctor when the office opens, and make an appointment. PAIN MEDICINES: * For pain  relief, take  acetaminophen, ibuprofen, or naproxen. * Use the lowest amount that makes your pain feel better. ACETAMINOPHEN (E.G., TYLENOL): * Take 650 mg (two 325 mg pills) by mouth every 4-6 hours as needed. Each Regular Strength Tylenol pill has 325 mg of acetaminophen. The most you should take each day is 3,250 mg (10 Regular Strength pills a day). * Another choice is to take 1,000 mg (two 500 mg pills) every 8 hours as needed. Each Extra Strength Tylenol pill has 500 mg of acetaminophen. The most you should take each day is 3,000 mg (6 Extra Strength pills a day). LOCAL HEAT: Apply warm wet washcloth or a heating pad for 20 minutes four times a day for pain relief. CALL BACK IF: * Fever occurs * You become worse. Referrals REFERRED TO PCP OFFICE

## 2016-04-12 NOTE — Telephone Encounter (Signed)
Pt said she is going to continue to observe and will cb if needed. Today is not as sore or as warm to touch; feels like there is a knot there. FYI to Dr Diona Browner.

## 2016-04-13 NOTE — Telephone Encounter (Signed)
Likely local inflammatory reaction. Symptomatic care unless fever or redness spreading.

## 2016-04-13 NOTE — Telephone Encounter (Signed)
Pt notified as instructed;Pt feeling better and is itchy around injection site today. Arm does not feel as warm to tough and pt is not running fever and the redness is not spreading. Pt will cb if needed.

## 2016-04-15 ENCOUNTER — Telehealth: Payer: Self-pay | Admitting: Family Medicine

## 2016-04-15 NOTE — Telephone Encounter (Signed)
Ms. Ecke notified by telephone that her LDL is almost at goal <130, good HDL and triglycerides at goal, normal kidney function , normal liver function and no diabetes.  Hepatitis C was also negative.

## 2016-04-15 NOTE — Telephone Encounter (Signed)
Pt called to get her lab results  Her my chart is not working/

## 2016-04-16 ENCOUNTER — Other Ambulatory Visit: Payer: Self-pay | Admitting: Family Medicine

## 2016-04-16 DIAGNOSIS — Z1231 Encounter for screening mammogram for malignant neoplasm of breast: Secondary | ICD-10-CM

## 2016-04-16 NOTE — Progress Notes (Signed)
Am I able to copy and paste things from ER notes,  Patient's history ,what was done by myself at a last OV etc. if I state that it is from a different note?

## 2016-04-23 ENCOUNTER — Telehealth: Payer: Self-pay

## 2016-04-23 NOTE — Telephone Encounter (Signed)
Incoming call , patient states she noticed a new red area to the top of her vulva . Patient states it itches at times. [name] Cross, APNP updated , orders received to have the patient come in Monday 04/26/2016 at 10:30 AM. Patient agreed to plan , denies further questions at this time.

## 2016-04-26 ENCOUNTER — Ambulatory Visit: Payer: BLUE CROSS/BLUE SHIELD | Attending: Gynecology | Admitting: Gynecology

## 2016-04-26 VITALS — BP 145/73 | HR 59 | Temp 98.1°F | Resp 18 | Ht 64.25 in | Wt 186.0 lb

## 2016-04-26 DIAGNOSIS — Z886 Allergy status to analgesic agent status: Secondary | ICD-10-CM | POA: Diagnosis not present

## 2016-04-26 DIAGNOSIS — Z8249 Family history of ischemic heart disease and other diseases of the circulatory system: Secondary | ICD-10-CM | POA: Insufficient documentation

## 2016-04-26 DIAGNOSIS — Z809 Family history of malignant neoplasm, unspecified: Secondary | ICD-10-CM | POA: Diagnosis not present

## 2016-04-26 DIAGNOSIS — Z881 Allergy status to other antibiotic agents status: Secondary | ICD-10-CM | POA: Insufficient documentation

## 2016-04-26 DIAGNOSIS — I10 Essential (primary) hypertension: Secondary | ICD-10-CM | POA: Diagnosis not present

## 2016-04-26 DIAGNOSIS — C4499 Other specified malignant neoplasm of skin, unspecified: Secondary | ICD-10-CM

## 2016-04-26 DIAGNOSIS — M1990 Unspecified osteoarthritis, unspecified site: Secondary | ICD-10-CM | POA: Insufficient documentation

## 2016-04-26 DIAGNOSIS — Z800 Family history of malignant neoplasm of digestive organs: Secondary | ICD-10-CM | POA: Diagnosis not present

## 2016-04-26 DIAGNOSIS — Z882 Allergy status to sulfonamides status: Secondary | ICD-10-CM | POA: Diagnosis not present

## 2016-04-26 DIAGNOSIS — Z803 Family history of malignant neoplasm of breast: Secondary | ICD-10-CM | POA: Insufficient documentation

## 2016-04-26 DIAGNOSIS — Z9109 Other allergy status, other than to drugs and biological substances: Secondary | ICD-10-CM | POA: Insufficient documentation

## 2016-04-26 DIAGNOSIS — Z86010 Personal history of colonic polyps: Secondary | ICD-10-CM | POA: Diagnosis not present

## 2016-04-26 DIAGNOSIS — G43909 Migraine, unspecified, not intractable, without status migrainosus: Secondary | ICD-10-CM | POA: Insufficient documentation

## 2016-04-26 DIAGNOSIS — K219 Gastro-esophageal reflux disease without esophagitis: Secondary | ICD-10-CM | POA: Insufficient documentation

## 2016-04-26 DIAGNOSIS — C519 Malignant neoplasm of vulva, unspecified: Secondary | ICD-10-CM | POA: Diagnosis present

## 2016-04-26 DIAGNOSIS — Z90711 Acquired absence of uterus with remaining cervical stump: Secondary | ICD-10-CM | POA: Insufficient documentation

## 2016-04-26 DIAGNOSIS — Z888 Allergy status to other drugs, medicaments and biological substances status: Secondary | ICD-10-CM | POA: Diagnosis not present

## 2016-04-26 DIAGNOSIS — Z87891 Personal history of nicotine dependence: Secondary | ICD-10-CM | POA: Insufficient documentation

## 2016-04-26 DIAGNOSIS — N909 Noninflammatory disorder of vulva and perineum, unspecified: Secondary | ICD-10-CM

## 2016-04-26 NOTE — Addendum Note (Signed)
Addended by: Clayborne Artist A on: 04/26/2016 04:07 PM   Modules accepted: Orders

## 2016-04-26 NOTE — Patient Instructions (Signed)
Will call you with biopsy results.

## 2016-04-26 NOTE — Progress Notes (Signed)
Consult Note: Gyn-Onc   [name] 65 y.o. female  Chief Complaint  Patient presents with  . paget's disease of vulva    Assessment : Paget's disease of the vulva ,  New lesion on the right upper vulva suspicious for recurrent disease.   Plan:   Biopsy of the lesion is obtained and submitted to pathology. We will contact the patient with results. If this does represent recurrent Paget's disease we will arrange to have a widely excise..  Interval History:    Patient was seen only 2 weeks ago during the interval has developed a new red" lesion on the right anterior vulva. This is on the opposite side where she was having some symptoms at last visit. There is a slight amount of tingling associated but not any pruritus. HPI:The patient initially underwent surgery for Paget's disease of the vulva in October 2011. There was focal involvement of the medial margin. The entire lesion is approximately 5 cm in diameter.   The patient previously had a supracervical hysterectomy but has her ovaries and cervix in situ.  She notes that she had some repair done at the time of her hysterectomy and at her introitus is tight and she has difficulty having intercourse and also has tearing. On the other hand her husband has had prostate cancer surgery making intercourse difficult as well.  Patient underwent wide local excision of a recurrent lesion on August 50,016 while all gross lesion was removed she did have positive surgical margins that were focal. Subsequently she underwent a another excision on 04/10/2015 with negative margins.     Review of Systems:10 point review of systems is negative except as noted in interval history.   Vitals: Blood pressure (!) 145/73, pulse (!) 59, temperature 98.1 F (36.7 C), temperature source Oral, resp. rate 18, height 5' 4.25" (1.632 m), weight 186 lb (84.4 kg), SpO2 98 %.  Physical Exam: General : The patient is a healthy woman in no acute distress.  HEENT:  normocephalic, extraoccular movements normal; neck is supple without thyromegally  Lynphnodes: Supraclavicular and inguinal nodes not enlarged  Abdomen: Soft, non-tender, no ascites, no organomegally, no masses, no hernias  Pelvic:  EGBUS: Normal female her as a defect in the right vulva consistent with prior excision. Just anterior to the prior vulvar incisions on the right is an area approximately 2 cm in diameter that is erythematous.    Lower extremities: No edema or varicosities. Normal range of motion   Procedure note:  After preparation with Betadine and 1% local anesthesia a punch biopsy representing part of the lesion was obtained. Hemostasis achieved with silver nitrate. Patient tolerated procedure well.         Allergies  Allergen Reactions  . Ibuprofen Hypertension  . Naprosyn [Naproxen] Nausea Only  . Other     Allergic to toothpaste- makes mouth peel Hay fever/dust mites/trees "365 allergic"   . Azithromycin Itching and Rash  . Sulfa Antibiotics Hives and Rash    Past Medical History:  Diagnosis Date  . Allergic rhinitis   . Arthritis   . GERD (gastroesophageal reflux disease)   . Headache(784.0)    Migraines  . History of adenomatous polyp of colon   . History of concussion    AS CHILD--  NO RESIDUAL  . History of palpitations   . Hypertension   . Paget's disease of vulva   . PONV (postoperative nausea and vomiting)    SEVERE  . Wears glasses  Past Surgical History:  Procedure Laterality Date  . mucoid cyst removal thumb    . 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 PULLEY RELEASE RIGHT THUMB  03-23-2011  . SIMPLE VULVECTOMY  03-24-2010   right side  . TONSILLECTOMY  1977  . TRIGGER FINGER RELEASE    . VAGINAL HYSTERECTOMY  1989   and Anterior and posterior repair's for prolapse  . VULVECTOMY Right 01/10/2015   Procedure: RIGHT WIDE LOCAL EXCISION VULVECTOMY;   Surgeon: [name], MD;  Location: [facility];  Service: Gynecology;  Laterality: Right;  Marland Kitchen VULVECTOMY Right 04/10/2015   Procedure: WIDE LOCAL EXCISION VULVA;  Surgeon: [name], MD;  Location: [facility];  Service: Gynecology;  Laterality: Right;  . WISDOM TOOTH EXTRACTION      Current Outpatient Prescriptions  Medication Sig Dispense Refill  . albuterol (PROVENTIL HFA;VENTOLIN HFA) 108 (90 BASE) MCG/ACT inhaler Inhale 2 puffs into the lungs every 6 (six) hours as needed. Reported on 10/24/2015    . doxylamine, Sleep, (UNISOM) 25 MG tablet Take 25 mg by mouth at bedtime as needed.    . fluticasone (FLONASE) 50 MCG/ACT nasal spray Place 2 sprays into the nose daily as needed.     . lansoprazole (PREVACID) 30 MG capsule Take 30 mg by mouth 2 (two) times daily before a meal.     . losartan-hydrochlorothiazide (HYZAAR) 100-25 MG tablet TAKE 1 TABLET DAILY IN THE MORNING 90 tablet 0  . metoprolol tartrate (LOPRESSOR) 25 MG tablet TAKE ONE-FOURTH TABLET (6.25 MG) TWICE A DAY (NEEDS OFFICE VISIT PRIOR TO ANY ADDITIONAL REFILLS) 45 tablet 0  . montelukast (SINGULAIR) 10 MG tablet TAKE 1 TABLET EVERY MORNING 90 tablet 0  . Triamcinolone & Emollient (DERMASORB TA) 0.1 % KIT   0   No current facility-administered medications for this visit.     Social History   Social History  . Marital status: Married    Spouse name: N/A  . Number of children: N/A  . Years of education: N/A   Occupational History  . Not on file.   Social History Main Topics  . Smoking status: Former Smoker    Years: 5.00    Types: Cigarettes    Quit date: 04/15/1989  . Smokeless tobacco: Never Used  . Alcohol use 0.6 oz/week    1 Glasses of wine per week     Comment: occasional wine  . Drug use: No  . Sexual activity: Not on file   Other Topics Concern  . Not on file   Social History Narrative  . No narrative on file    Family History  Problem Relation  Age of Onset  . Cancer Maternal Grandmother   . Hypertension Father   . Heart disease Father   . Hypertension Mother   . Heart disease Mother   . Breast cancer Paternal Grandmother 68  . Colon cancer Paternal Uncle       [name], MD 04/26/2016, 11:34 AM

## 2016-05-03 ENCOUNTER — Ambulatory Visit (INDEPENDENT_AMBULATORY_CARE_PROVIDER_SITE_OTHER): Payer: BLUE CROSS/BLUE SHIELD | Admitting: Family Medicine

## 2016-05-03 ENCOUNTER — Encounter: Payer: Self-pay | Admitting: Family Medicine

## 2016-05-03 ENCOUNTER — Telehealth: Payer: Self-pay

## 2016-05-03 DIAGNOSIS — J312 Chronic pharyngitis: Secondary | ICD-10-CM | POA: Insufficient documentation

## 2016-05-03 DIAGNOSIS — J029 Acute pharyngitis, unspecified: Secondary | ICD-10-CM | POA: Diagnosis not present

## 2016-05-03 LAB — POCT RAPID STREP A (OFFICE): RAPID STREP A SCREEN: NEGATIVE

## 2016-05-03 MED ORDER — HYDROCODONE-HOMATROPINE 5-1.5 MG/5ML PO SYRP: ORAL_SOLUTION | ORAL | 0 refills | Status: DC

## 2016-05-03 NOTE — Addendum Note (Signed)
Addended by: Carter Kitten on: 05/03/2016 12:09 PM   Modules accepted: Orders

## 2016-05-03 NOTE — Progress Notes (Signed)
   Subjective:    Patient ID: [name], female    DOB: 31-Aug-1950, 65 y.o.   MRN: [id number]  Sore Throat   This is a new problem. The current episode started in the past 7 days. The problem has been gradually worsening. There has been no fever. The pain is moderate. Associated symptoms include coughing, a hoarse voice and swollen glands. Pertinent negatives include no ear discharge, ear pain or shortness of breath. Associated symptoms comments:  No ear or sinus pain.  Sore throat , red in throat. NO mucus.. She has had no exposure to strep or mono. She has tried gargles for the symptoms. The treatment provided mild relief.  Cough  This is a new problem. The current episode started yesterday. The cough is non-productive. Pertinent negatives include no ear pain or shortness of breath. She has tried prescription cough suppressant for the symptoms. The treatment provided moderate relief.       Review of Systems  HENT: Positive for hoarse voice. Negative for ear discharge and ear pain.   Respiratory: Positive for cough. Negative for shortness of breath.        Objective:   Physical Exam  Constitutional: Vital signs are normal. She appears well-developed and well-nourished. She is cooperative.  Non-toxic appearance. She does not appear ill. No distress.  HENT:  Head: Normocephalic.  Right Ear: Hearing, tympanic membrane, external ear and ear canal normal. Tympanic membrane is not erythematous, not retracted and not bulging.  Left Ear: Hearing, tympanic membrane, external ear and ear canal normal. Tympanic membrane is not erythematous, not retracted and not bulging.  Nose: Mucosal edema and rhinorrhea present. Right sinus exhibits no maxillary sinus tenderness and no frontal sinus tenderness. Left sinus exhibits no maxillary sinus tenderness and no frontal sinus tenderness.  Mouth/Throat: Uvula is midline and mucous membranes are normal. Posterior oropharyngeal erythema present. No  oropharyngeal exudate or posterior oropharyngeal edema.  Eyes: Conjunctivae, EOM and lids are normal. Pupils are equal, round, and reactive to light. Lids are everted and swept, no foreign bodies found.  Neck: Trachea normal and normal range of motion. Neck supple. Carotid bruit is not present. No thyroid mass and no thyromegaly present.  Cardiovascular: Normal rate, regular rhythm, S1 normal, S2 normal, normal heart sounds, intact distal pulses and normal pulses.  Exam reveals no gallop and no friction rub.   No murmur heard. Pulmonary/Chest: Effort normal and breath sounds normal. No tachypnea. No respiratory distress. She has no decreased breath sounds. She has no wheezes. She has no rhonchi. She has no rales.  Neurological: She is alert.  Skin: Skin is warm, dry and intact. No rash noted.  Psychiatric: Her speech is normal and behavior is normal. Judgment normal. Her mood appears not anxious. Cognition and memory are normal. She does not exhibit a depressed mood.          Assessment & Plan:

## 2016-05-03 NOTE — Patient Instructions (Addendum)
Use cough suppressant at night for cough. Tylenol or ibuprofen for sore throat. Gargle as needed. Call if not improving as expected or fever late in illness.

## 2016-05-03 NOTE — Assessment & Plan Note (Signed)
eval with strep test: neg.  Most likely viral URI/pharyngitis.  Treat symptomatically.

## 2016-05-03 NOTE — Progress Notes (Signed)
Pre visit review using our clinic review tool, if applicable. No additional management support is needed unless otherwise documented below in the visit note. 

## 2016-05-03 NOTE — Telephone Encounter (Signed)
RN spoke with pt, notified of benign results, neg pagets. Verbalized understanding. Next appt 07/26/16 @ 1:15

## 2016-05-04 ENCOUNTER — Telehealth: Payer: Self-pay

## 2016-05-04 DIAGNOSIS — L292 Pruritus vulvae: Secondary | ICD-10-CM

## 2016-05-04 MED ORDER — TRIAMCINOLONE ACETONIDE 0.5 % EX OINT: 1.0000 "application " | TOPICAL_OINTMENT | Freq: Two times a day (BID) | CUTANEOUS | 1 refills | Status: DC

## 2016-05-04 NOTE — Telephone Encounter (Signed)
Refill on triamcinolone 0.5% sent to [facility], [place]

## 2016-05-06 ENCOUNTER — Telehealth: Payer: Self-pay

## 2016-05-06 MED ORDER — AMOXICILLIN 500 MG PO CAPS: 1000.0000 mg | ORAL_CAPSULE | Freq: Two times a day (BID) | ORAL | 0 refills | Status: DC

## 2016-05-06 NOTE — Telephone Encounter (Signed)
Pt left v/m; pt seen 05/03/16; pt still has S/T, runny nose,cough,chest congestion, pt had low grade fever last night but no fever today. Pt feel worse than when seen 05/03/16. Pt request cb. [facility] [address]

## 2016-05-06 NOTE — Telephone Encounter (Signed)
Ms. Fudge notified as instructed by telephone.

## 2016-05-06 NOTE — Telephone Encounter (Signed)
Pt now 10 days into illness, with new fever and worsening symptoms.. Will send in rx for antibiotic to treat given concern for  Bacterial infection.

## 2016-05-10 ENCOUNTER — Ambulatory Visit (INDEPENDENT_AMBULATORY_CARE_PROVIDER_SITE_OTHER): Payer: BLUE CROSS/BLUE SHIELD | Admitting: Family Medicine

## 2016-05-10 ENCOUNTER — Encounter: Payer: Self-pay | Admitting: Family Medicine

## 2016-05-10 VITALS — BP 136/70 | HR 57 | Temp 98.6°F | Ht 64.25 in | Wt 184.8 lb

## 2016-05-10 DIAGNOSIS — J069 Acute upper respiratory infection, unspecified: Secondary | ICD-10-CM | POA: Diagnosis not present

## 2016-05-10 DIAGNOSIS — B9789 Other viral agents as the cause of diseases classified elsewhere: Secondary | ICD-10-CM | POA: Diagnosis not present

## 2016-05-10 MED ORDER — GUAIFENESIN-CODEINE 100-10 MG/5ML PO SYRP: 5.0000 mL | ORAL_SOLUTION | Freq: Four times a day (QID) | ORAL | 0 refills | Status: DC | PRN

## 2016-05-10 NOTE — Progress Notes (Signed)
Subjective:    Patient ID: [name], female    DOB: [birth year], 65 y.o.   MRN: [id number]  HPI Here for uri symptoms   Was dx with viral process 11/27 by Dr Diona Browner  rst was neg  She was then given amoxil 500 mg 2 bid - on 11/30 for suspected bacterial infection   Now having nasal drip and wheeze and cough  Hurt her ribs from coughing  Phlegm is mostly clear  Nasal d/c is clear to yellow   No headache   Low grade temp elevation- better in last few days 99.8 was the highest   ST got better and now just a little scratchy   Otc: took tussin   Work: talks on the phone all day (does not want to cough into the phone)   Patient Active Problem List   Diagnosis Date Noted  . Viral URI with cough 05/10/2016  . Sore throat 05/03/2016  . Reactive airway disease 04/24/2013  . Paget's disease of vulva 05/01/2012  . COLONIC POLYPS, ADENOMATOUS, HX OF 12/11/2008  . Migraine without aura 10/10/2008  . Essential hypertension, benign 10/10/2008  . Allergic rhinitis 10/10/2008  . GERD 10/10/2008  . PERIMENOPAUSAL SYNDROME 10/10/2008  . OSTEOARTHRITIS 10/10/2008  . DEGENERATIVE DISC DISEASE, CERVICAL SPINE 10/10/2008   Past Medical History:  Diagnosis Date  . Allergic rhinitis   . Arthritis   . GERD (gastroesophageal reflux disease)   . Headache(784.0)    Migraines  . History of adenomatous polyp of colon   . History of concussion    AS CHILD--  NO RESIDUAL  . History of palpitations   . Hypertension   . Paget's disease of vulva   . PONV (postoperative nausea and vomiting)    SEVERE  . Wears glasses    Past Surgical History:  Procedure Laterality Date  . mucoid cyst removal thumb    . 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 PULLEY RELEASE RIGHT THUMB  03-23-2011  . SIMPLE VULVECTOMY  03-24-2010   right side  . TONSILLECTOMY  1977  . TRIGGER FINGER RELEASE    . VAGINAL HYSTERECTOMY   1989   and Anterior and posterior repair's for prolapse  . VULVECTOMY Right 01/10/2015   Procedure: RIGHT WIDE LOCAL EXCISION VULVECTOMY;  Surgeon: [name], MD;  Location: [facility];  Service: Gynecology;  Laterality: Right;  Marland Kitchen VULVECTOMY Right 04/10/2015   Procedure: WIDE LOCAL EXCISION VULVA;  Surgeon: [name], MD;  Location: [facility];  Service: Gynecology;  Laterality: Right;  . WISDOM TOOTH EXTRACTION     Social History  Substance Use Topics  . Smoking status: Former Smoker    Years: 5.00    Types: Cigarettes    Quit date: 04/15/1989  . Smokeless tobacco: Never Used  . Alcohol use 0.6 oz/week    1 Glasses of wine per week     Comment: occasional wine   Family History  Problem Relation Age of Onset  . Cancer Maternal Grandmother   . Hypertension Father   . Heart disease Father   . Hypertension Mother   . Heart disease Mother   . Breast cancer Paternal Grandmother 33  . Colon cancer Paternal Uncle    Allergies  Allergen Reactions  . Ibuprofen Hypertension  . Naprosyn [Naproxen] Nausea Only  . Other     Allergic to toothpaste- makes mouth peel Hay fever/dust mites/trees "  365 allergic"   . Azithromycin Itching and Rash  . Sulfa Antibiotics Hives and Rash   Current Outpatient Prescriptions on File Prior to Visit  Medication Sig Dispense Refill  . albuterol (PROVENTIL HFA;VENTOLIN HFA) 108 (90 BASE) MCG/ACT inhaler Inhale 2 puffs into the lungs every 6 (six) hours as needed. Reported on 10/24/2015    . amoxicillin (AMOXIL) 500 MG capsule Take 2 capsules (1,000 mg total) by mouth 2 (two) times daily. 40 capsule 0  . doxylamine, Sleep, (UNISOM) 25 MG tablet Take 25 mg by mouth at bedtime as needed.    . fluticasone (FLONASE) 50 MCG/ACT nasal spray Place 2 sprays into the nose daily as needed.     . lansoprazole (PREVACID) 30 MG capsule Take 30 mg by mouth 2 (two) times daily before a meal.     .  losartan-hydrochlorothiazide (HYZAAR) 100-25 MG tablet TAKE 1 TABLET DAILY IN THE MORNING 90 tablet 0  . metoprolol tartrate (LOPRESSOR) 25 MG tablet TAKE ONE-FOURTH TABLET (6.25 MG) TWICE A DAY (NEEDS OFFICE VISIT PRIOR TO ANY ADDITIONAL REFILLS) 45 tablet 0  . montelukast (SINGULAIR) 10 MG tablet TAKE 1 TABLET EVERY MORNING 90 tablet 0  . Triamcinolone & Emollient (DERMASORB TA) 0.1 % KIT   0  . triamcinolone ointment (KENALOG) 0.5 % Apply 1 application topically 2 (two) times daily. 30 g 1   No current facility-administered medications on file prior to visit.     Review of Systems  Constitutional: Positive for appetite change and fatigue. Negative for fever.  HENT: Positive for congestion, postnasal drip, rhinorrhea, sinus pressure, sneezing and sore throat. Negative for ear pain.   Eyes: Negative for pain and discharge.  Respiratory: Positive for cough. Negative for shortness of breath, wheezing and stridor.   Cardiovascular: Negative for chest pain.  Gastrointestinal: Negative for diarrhea, nausea and vomiting.  Genitourinary: Negative for frequency, hematuria and urgency.  Musculoskeletal: Negative for arthralgias and myalgias.  Skin: Negative for rash.  Neurological: Positive for headaches. Negative for dizziness, weakness and light-headedness.  Psychiatric/Behavioral: Negative for confusion and dysphoric mood.       Objective:   Physical Exam  Constitutional: She appears well-developed and well-nourished. No distress.  Well appearing  occ cough  HENT:  Head: Normocephalic and atraumatic.  Right Ear: External ear normal.  Left Ear: External ear normal.  Mouth/Throat: Oropharynx is clear and moist.  Nares are injected and congested  No sinus tenderness Clear rhinorrhea and post nasal drip   R TM dull L TM retracted w/o erythema  Eyes: Conjunctivae and EOM are normal. Pupils are equal, round, and reactive to light. Right eye exhibits no discharge. Left eye exhibits no  discharge.  Neck: Normal range of motion. Neck supple.  Cardiovascular: Normal rate and normal heart sounds.   Pulmonary/Chest: Effort normal and breath sounds normal. No respiratory distress. She has no wheezes. She has no rales. She exhibits no tenderness.  Good air exch No wheeze even on forced exp No rales or rhonchi   Scant upper airway sounds cleared by cough  Lymphadenopathy:    She has no cervical adenopathy.  Neurological: She is alert.  Skin: Skin is warm and dry. No rash noted.  Psychiatric: She has a normal mood and affect.          Assessment & Plan:   Problem List Items Addressed This Visit      Respiratory   Viral URI with cough    Ongoing uri symptoms with nl exam  Will finish  amoxil Try robitussin ac for cough and see if this works better (caution of sedation) Watch for wheezing (none today)-has albuterol Disc symptomatic care - see instructions on AVS  Update if not starting to improve in a week or if worsening  (esp if reactive airway symptoms) Handout on uri given  Work note given for today

## 2016-05-10 NOTE — Assessment & Plan Note (Signed)
Ongoing uri symptoms with nl exam  Will finish amoxil Try robitussin ac for cough and see if this works better (caution of sedation) Watch for wheezing (none today)-has albuterol Disc symptomatic care - see instructions on AVS  Update if not starting to improve in a week or if worsening  (esp if reactive airway symptoms) Handout on uri given  Work note given for today

## 2016-05-10 NOTE — Patient Instructions (Signed)
I think you have a viral upper respiratory infection  Drink lots of fluids and rest  Try the cough medicine as needed with caution of sedation Use your albuterol inhaler as needed for wheezing  If your breathing worsens please alert [place]   Update if not starting to improve in a week or if worsening

## 2016-05-10 NOTE — Progress Notes (Signed)
Pre visit review using our clinic review tool, if applicable. No additional management support is needed unless otherwise documented below in the visit note. 

## 2016-05-22 ENCOUNTER — Encounter (HOSPITAL_COMMUNITY): Payer: Self-pay | Admitting: *Deleted

## 2016-05-22 ENCOUNTER — Emergency Department (HOSPITAL_COMMUNITY): Payer: BLUE CROSS/BLUE SHIELD

## 2016-05-22 ENCOUNTER — Emergency Department (HOSPITAL_COMMUNITY)
Admission: EM | Admit: 2016-05-22 | Discharge: 2016-05-22 | Disposition: A | Discharge status: Home / Self Care | Payer: BLUE CROSS/BLUE SHIELD | Attending: Emergency Medicine | Admitting: Emergency Medicine

## 2016-05-22 DIAGNOSIS — Y939 Activity, unspecified: Secondary | ICD-10-CM | POA: Diagnosis not present

## 2016-05-22 DIAGNOSIS — J45909 Unspecified asthma, uncomplicated: Secondary | ICD-10-CM | POA: Diagnosis not present

## 2016-05-22 DIAGNOSIS — Z87891 Personal history of nicotine dependence: Secondary | ICD-10-CM | POA: Insufficient documentation

## 2016-05-22 DIAGNOSIS — S0011XA Contusion of right eyelid and periocular area, initial encounter: Secondary | ICD-10-CM | POA: Insufficient documentation

## 2016-05-22 DIAGNOSIS — Y999 Unspecified external cause status: Secondary | ICD-10-CM | POA: Diagnosis not present

## 2016-05-22 DIAGNOSIS — I10 Essential (primary) hypertension: Secondary | ICD-10-CM | POA: Diagnosis not present

## 2016-05-22 DIAGNOSIS — S0511XA Contusion of eyeball and orbital tissues, right eye, initial encounter: Secondary | ICD-10-CM

## 2016-05-22 DIAGNOSIS — W01198A Fall on same level from slipping, tripping and stumbling with subsequent striking against other object, initial encounter: Secondary | ICD-10-CM | POA: Insufficient documentation

## 2016-05-22 DIAGNOSIS — S0591XA Unspecified injury of right eye and orbit, initial encounter: Secondary | ICD-10-CM | POA: Diagnosis present

## 2016-05-22 DIAGNOSIS — Y929 Unspecified place or not applicable: Secondary | ICD-10-CM | POA: Insufficient documentation

## 2016-05-22 IMAGING — CT CT MAXILLOFACIAL W/O CM
3 series · 16 of 47 positions shown, 19 images · non-contrast
Comparison: None.

CLINICAL DATA: Initial evaluation for acute trauma, fall. Swelling
and right orbital region with pain at right temporal region.

EXAM:
CT MAXILLOFACIAL WITHOUT CONTRAST
TECHNIQUE: Multidetector CT imaging of the maxillofacial structures was
performed. Multiplanar CT image reconstructions were also generated.
A small metallic BB was placed on the right temple in order to
reliably differentiate right from left.

[Series 201: facial bones, idose (1) · axial · 0.34mm/px · z∈[+22,+162]mm · 10 of 82 slices shown, 13 images]
[im 6/82  brain]
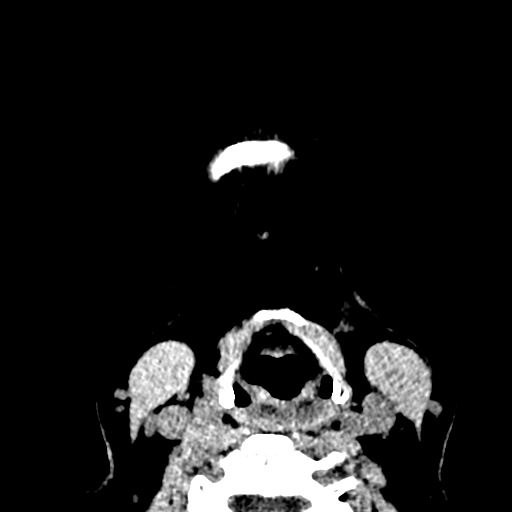
[im 6/82  bone]
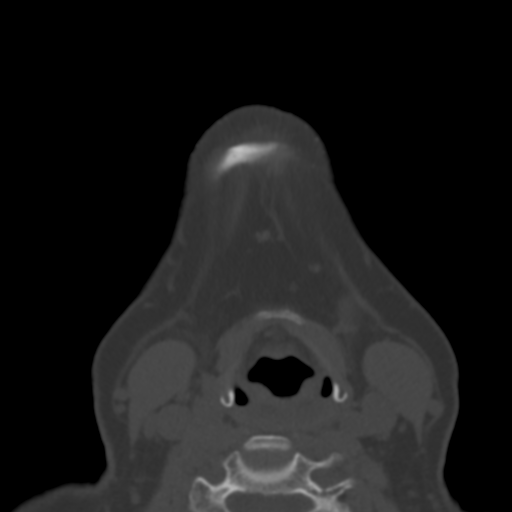
[im 14/82  bone]
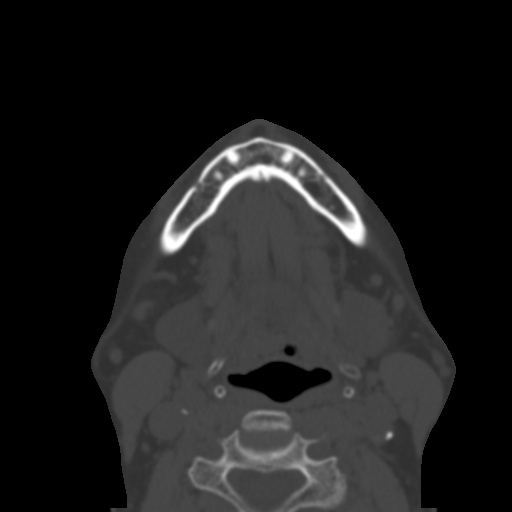
[im 23/82  bone]
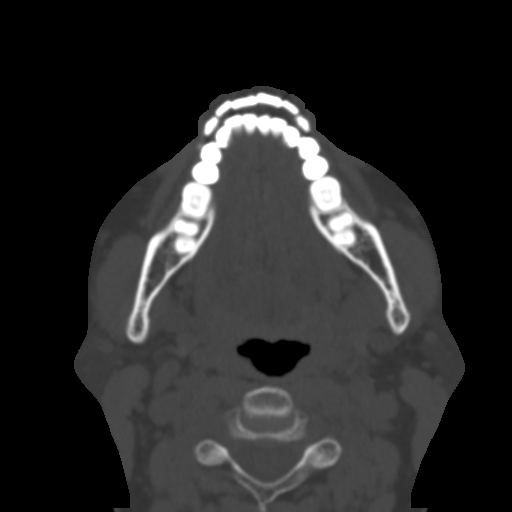
[im 28/82  bone]
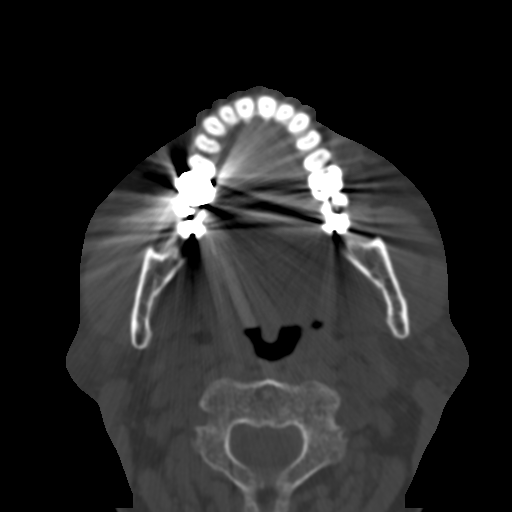
[im 37/82  brain]
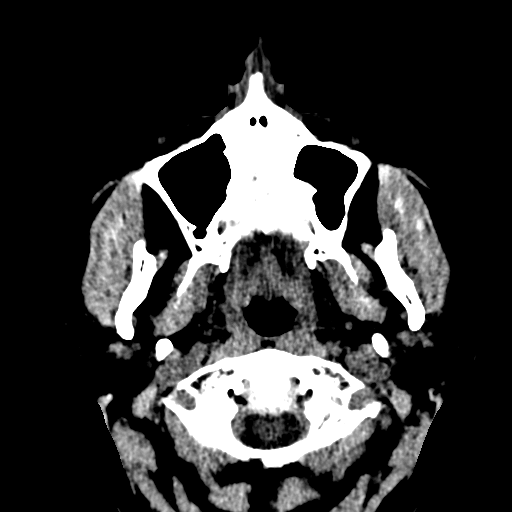
[im 37/82  bone]
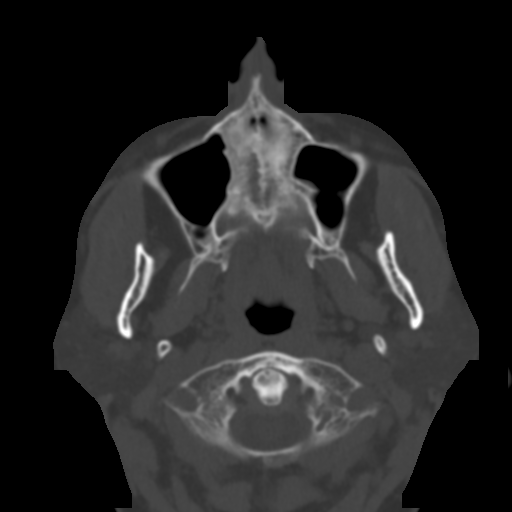
[im 45/82  bone]
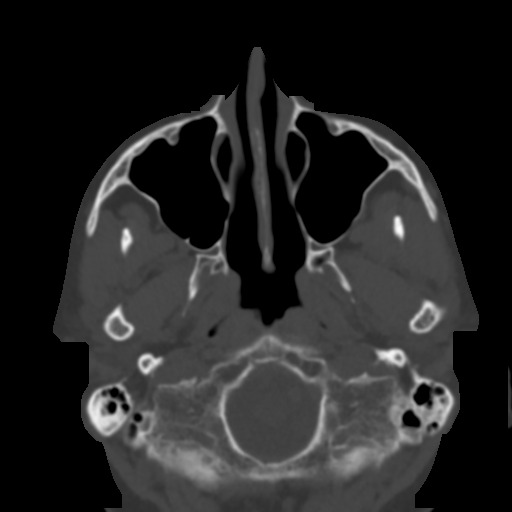
[im 54/82  bone]
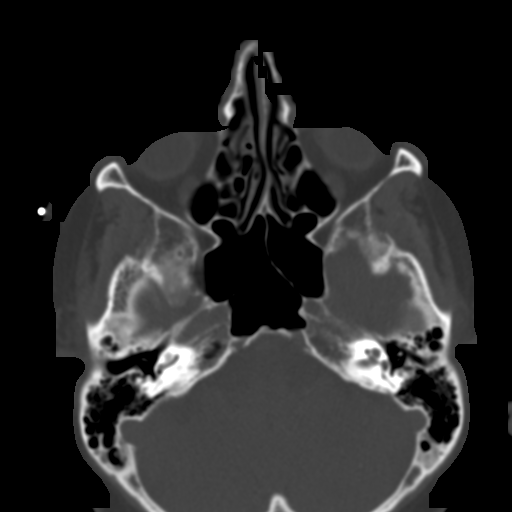
[im 62/82  bone]
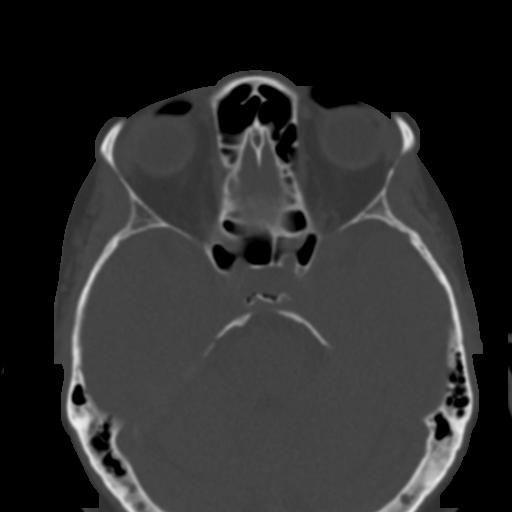
[im 68/82  brain]
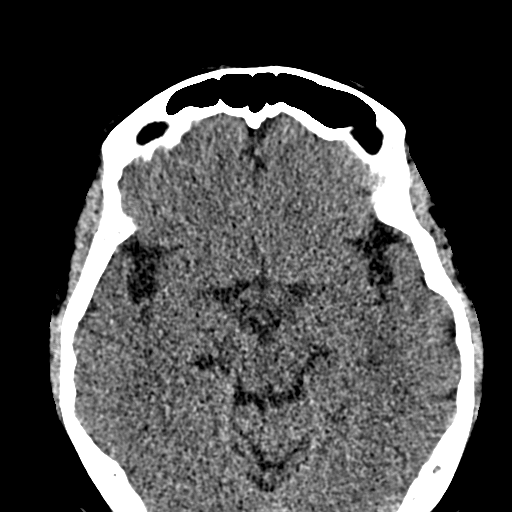
[im 68/82  bone]
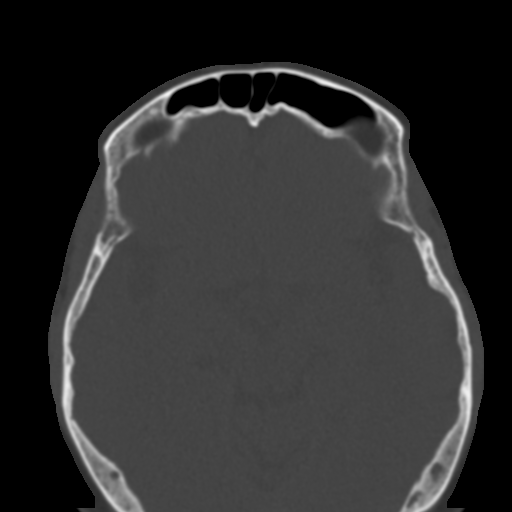
[im 76/82  bone]
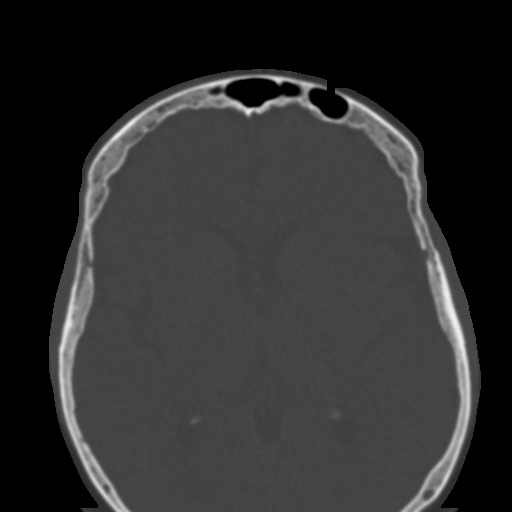

[Series 203: coronal std, idose (1) · coronal · 0.33mm/px · 3 of 86 slices shown]
[im 29/86  bone]
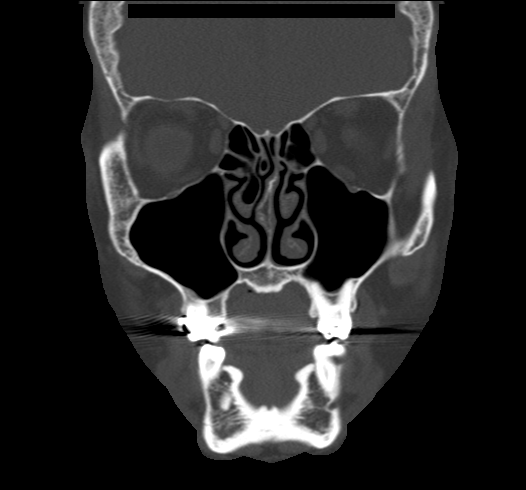
[im 38/86  bone]
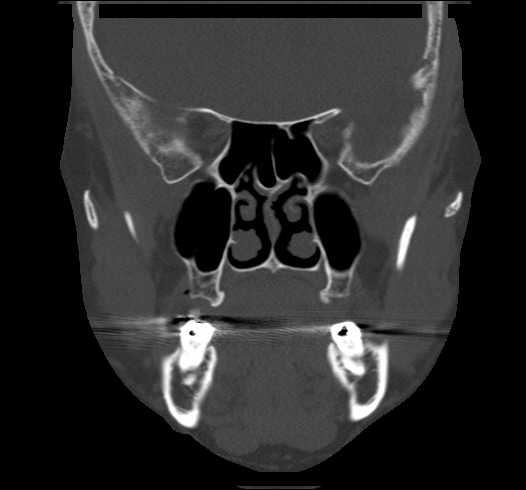
[im 48/86  bone]
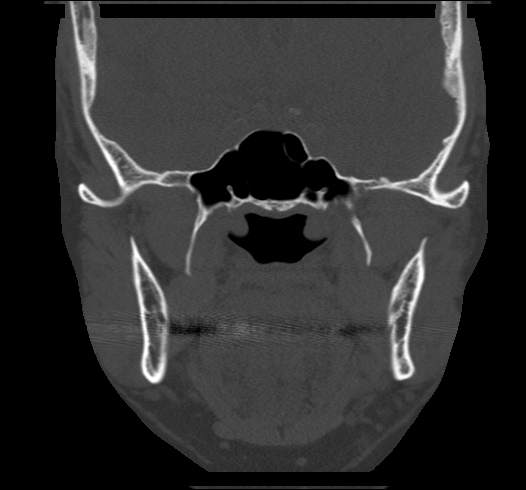

[Series 204: sagittal std, idose (1) · sagittal · 0.33mm/px · 3 of 88 slices shown]
[im 30/88  bone]
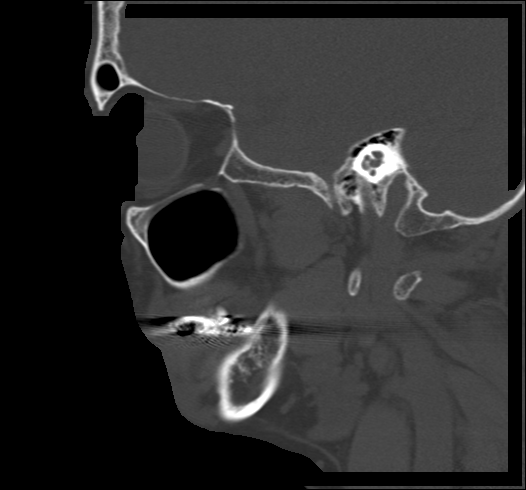
[im 44/88  bone]
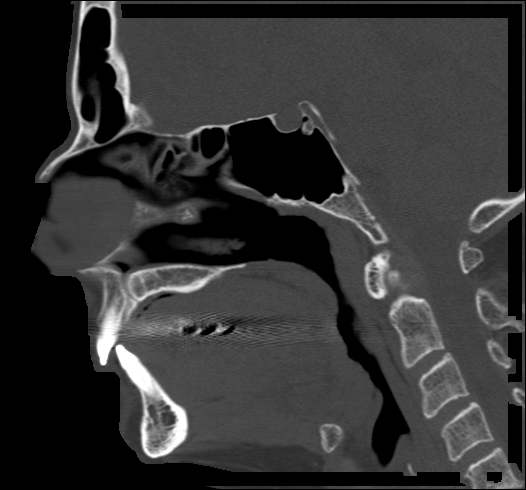
[im 59/88  bone]
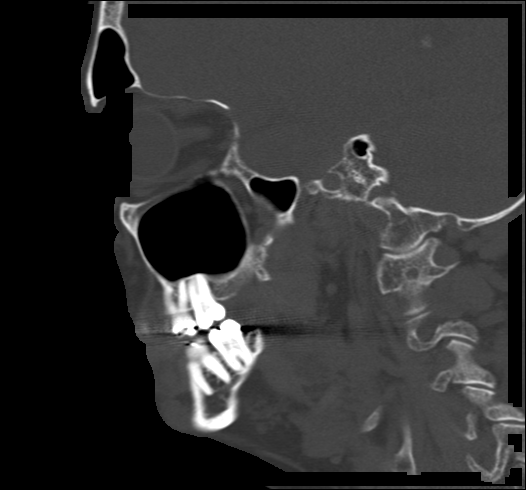

[16 of 47 positions shown; findings below may reference images not displayed]

FINDINGS: Osseous: Zygomatic arches intact. No acute maxillary fracture.
Pterygoid plates intact. No acute nasal bone fracture. Nasal septum
intact. Mandible intact. Mandibular condyles normally situated. No
acute abnormality about the dentition.

Orbits: Globes intact. No retro-orbital hematoma or other pathology.
Bony orbits intact without evidence orbital floor fracture.

Sinuses: Paranasal sinuses are clear. Visualized mastoids are clear.
Middle ear cavities are well pneumatized.

Soft tissues: Small soft tissue contusion present just above the
right orbit. No other appreciable soft tissue swelling seen about
face.

Limited intracranial: Limited views of the brain demonstrate no
acute abnormality. Age related cerebral atrophy noted.
IMPRESSION: 1. Small soft tissue contusion within the right supraorbital region.
2. No other acute maxillofacial injury identified.  No fracture.

## 2016-05-22 NOTE — ED Notes (Signed)
Pt presents with bruised R eye s/p fall 3 days ago.  Yellowed bruising to R forehead.  Mild pain.  No visual loss or LOC.

## 2016-05-22 NOTE — ED Provider Notes (Signed)
[facility] DEPT Provider Note   CSN: [id number] Arrival date & time: 05/22/16  2048  By signing my name below, I, [name], attest that this documentation has been prepared under the direction and in the presence of Montine Circle, PA-C. Electronically Signed: Dora Macias, Scribe. 05/22/2016. 9:33 PM.  History   Chief Complaint Chief Complaint  Patient presents with  . Eye Injury    The history is provided by the patient. No language interpreter was used.    HPI Comments: [name] is a 65 y.o. female who presents to the Emergency Department complaining of constant bruising around her right eye s/p falling 3 days ago. She reports she tripped over a laundry basket in the middle of the night and struck her face on her bed frame. No LOC. She reports associated throbbing, mild pain around her right eye. She states her bruising has spread down her face and she is concerned for an internal bleed. No anticoagulants. She denies numbness, weakness, vision changes, fever, chills, or any other associated symptoms.  Past Medical History:  Diagnosis Date  . Allergic rhinitis   . Arthritis   . GERD (gastroesophageal reflux disease)   . Headache(784.0)    Migraines  . History of adenomatous polyp of colon   . History of concussion    AS CHILD--  NO RESIDUAL  . History of palpitations   . Hypertension   . Paget's disease of vulva   . PONV (postoperative nausea and vomiting)    SEVERE  . Wears glasses     Patient Active Problem List   Diagnosis Date Noted  . Viral URI with cough 05/10/2016  . Sore throat 05/03/2016  . Reactive airway disease 04/24/2013  . Paget's disease of vulva 05/01/2012  . COLONIC POLYPS, ADENOMATOUS, HX OF 12/11/2008  . Migraine without aura 10/10/2008  . Essential hypertension, benign 10/10/2008  . Allergic rhinitis 10/10/2008  . GERD 10/10/2008  . PERIMENOPAUSAL SYNDROME 10/10/2008  . OSTEOARTHRITIS 10/10/2008  . DEGENERATIVE DISC  DISEASE, CERVICAL SPINE 10/10/2008    Past Surgical History:  Procedure Laterality Date  . mucoid cyst removal thumb    . 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 PULLEY RELEASE RIGHT THUMB  03-23-2011  . SIMPLE VULVECTOMY  03-24-2010   right side  . TONSILLECTOMY  1977  . TRIGGER FINGER RELEASE    . VAGINAL HYSTERECTOMY  1989   and Anterior and posterior repair's for prolapse  . VULVECTOMY Right 01/10/2015   Procedure: RIGHT WIDE LOCAL EXCISION VULVECTOMY;  Surgeon: [name], MD;  Location: [facility];  Service: Gynecology;  Laterality: Right;  Marland Kitchen VULVECTOMY Right 04/10/2015   Procedure: WIDE LOCAL EXCISION VULVA;  Surgeon: [name], MD;  Location: [facility];  Service: Gynecology;  Laterality: Right;  . WISDOM TOOTH EXTRACTION      OB History    No data available       Home Medications    Prior to Admission medications   Medication Sig Start Date End Date Taking? Authorizing Provider  albuterol (PROVENTIL HFA;VENTOLIN HFA) 108 (90 BASE) MCG/ACT inhaler Inhale 2 puffs into the lungs every 6 (six) hours as needed. Reported on 10/24/2015    Historical Provider, MD  amoxicillin (AMOXIL) 500 MG capsule Take 2 capsules (1,000 mg total) by mouth 2 (two) times daily. 05/06/16   Amy Cletis Athens, MD  Dextromethorphan HBr (TUSSIN COUGH PO) Take by mouth  daily as needed.    Historical Provider, MD  doxylamine, Sleep, (UNISOM) 25 MG tablet Take 25 mg by mouth at bedtime as needed.    Historical Provider, MD  fluticasone (FLONASE) 50 MCG/ACT nasal spray Place 2 sprays into the nose daily as needed.     Historical Provider, MD  guaiFENesin-codeine (ROBITUSSIN AC) 100-10 MG/5ML syrup Take 5 mLs by mouth 4 (four) times daily as needed for cough. If not working or driving since it can sedate 05/10/16   Abner Greenspan, MD  lansoprazole (PREVACID) 30 MG capsule Take 30  mg by mouth 2 (two) times daily before a meal.  07/26/15   Historical Provider, MD  losartan-hydrochlorothiazide (HYZAAR) 100-25 MG tablet TAKE 1 TABLET DAILY IN THE MORNING 03/12/16   Amy E Diona Browner, MD  metoprolol tartrate (LOPRESSOR) 25 MG tablet TAKE ONE-FOURTH TABLET (6.25 MG) TWICE A DAY (NEEDS OFFICE VISIT PRIOR TO ANY ADDITIONAL REFILLS) 02/23/16   Amy Cletis Athens, MD  montelukast (SINGULAIR) 10 MG tablet TAKE 1 TABLET EVERY MORNING 03/19/16   Amy Cletis Athens, MD  Triamcinolone & Emollient (DERMASORB TA) 0.1 % KIT  02/11/16   Historical Provider, MD  triamcinolone ointment (KENALOG) 0.5 % Apply 1 application topically 2 (two) times daily. 05/04/16   Dorothyann Gibbs, NP    Family History Family History  Problem Relation Age of Onset  . Cancer Maternal Grandmother   . Hypertension Father   . Heart disease Father   . Hypertension Mother   . Heart disease Mother   . Breast cancer Paternal Grandmother 69  . Colon cancer Paternal Uncle     Social History Social History  Substance Use Topics  . Smoking status: Former Smoker    Years: 5.00    Types: Cigarettes    Quit date: 04/15/1989  . Smokeless tobacco: Never Used  . Alcohol use 0.6 oz/week    1 Glasses of wine per week     Comment: occasional wine     Allergies   Ibuprofen; Naprosyn [naproxen]; Other; Azithromycin; and Sulfa antibiotics   Review of Systems Review of Systems  Constitutional: Negative for chills and fever.  HENT:       Positive for facial pain (around right eye).  Eyes: Negative for visual disturbance.  Skin: Positive for color change (bruising around right eye).  Neurological: Negative for syncope, weakness and numbness.     Physical Exam Updated Vital Signs BP 166/94   Pulse 66   Temp 98.3 F (36.8 C) (Oral)   Resp 16   SpO2 95%   Physical Exam  Constitutional: She is oriented to person, place, and time. She appears well-developed and well-nourished. No distress.  HENT:  Head: Normocephalic.    Tenderness to palpation over the right lateral orbit without crepitus, bony abnormality or deformity.  Eyes: Conjunctivae and EOM are normal.  Normal EOM's. No evidence of entrapment.   Neck: Neck supple. No tracheal deviation present.  Cardiovascular: Normal rate.   Pulmonary/Chest: Effort normal. No respiratory distress.  Musculoskeletal: Normal range of motion.  Neurological: She is alert and oriented to person, place, and time.  Skin: Skin is warm and dry.  Right sided periorbital contusion.  Psychiatric: She has a normal mood and affect. Her behavior is normal.  Nursing note and vitals reviewed.    ED Treatments / Results  Labs (all labs ordered are listed, but only abnormal results are displayed) Labs Reviewed - No data to display  EKG  EKG Interpretation None  Radiology Ct Maxillofacial Wo Contrast  Result Date: 05/22/2016 CLINICAL DATA:  Initial evaluation for acute trauma, fall. Swelling and right orbital region with pain at right temporal region. EXAM: CT MAXILLOFACIAL WITHOUT CONTRAST TECHNIQUE: Multidetector CT imaging of the maxillofacial structures was performed. Multiplanar CT image reconstructions were also generated. A small metallic BB was placed on the right temple in order to reliably differentiate right from left. COMPARISON:  None. FINDINGS: Osseous: Zygomatic arches intact. No acute maxillary fracture. Pterygoid plates intact. No acute nasal bone fracture. Nasal septum intact. Mandible intact. Mandibular condyles normally situated. No acute abnormality about the dentition. Orbits: Globes intact. No retro-orbital hematoma or other pathology. Bony orbits intact without evidence orbital floor fracture. Sinuses: Paranasal sinuses are clear. Visualized mastoids are clear. Middle ear cavities are well pneumatized. Soft tissues: Small soft tissue contusion present just above the right orbit. No other appreciable soft tissue swelling seen about face. Limited  intracranial: Limited views of the brain demonstrate no acute abnormality. Age related cerebral atrophy noted. IMPRESSION: 1. Small soft tissue contusion within the right supraorbital region. 2. No other acute maxillofacial injury identified.  No fracture. Electronically Signed   By: Jeannine Boga M.D.   On: 05/22/2016 22:14    Procedures Procedures (including critical care time)  DIAGNOSTIC STUDIES: Oxygen Saturation is 95% on RA, adequate by my interpretation.    COORDINATION OF CARE: 9:39 PM Discussed treatment plan with pt at bedside and pt agreed to plan.  Medications Ordered in ED Medications - No data to display   Initial Impression / Assessment and Plan / ED Course  I have reviewed the triage vital signs and the nursing notes.  Pertinent labs & imaging results that were available during my care of the patient were reviewed by me and considered in my medical decision making (see chart for details).  Clinical Course     Patient with right-sided periorbital contusion. Patient would like to have further imaging, as a fracture is not ruled out based on physical exam. She has not have any evidence of entrapment. However, given her worsening pain, I feel that it is appropriate to get CT scan. CT shows no evidence of fracture, but does show a contusion. Recommend conservative therapy. Primary care follow-up.  Final Clinical Impressions(s) / ED Diagnoses   Final diagnoses:  Periorbital contusion of right eye, initial encounter    New Prescriptions New Prescriptions   No medications on file   I personally performed the services described in this documentation, which was scribed in my presence. The recorded information has been reviewed and is accurate.      Montine Circle, PA-C 05/22/16 [place], MD 05/23/16 2037

## 2016-05-22 NOTE — ED Triage Notes (Signed)
Pt is here to be evaluated for a black eye which she sustained 3 days ago.  Pt denies any LOC. Pt has no changes in her vision, no weakness or numbness.  Pt has a bruise above her eye which has turned almost yellow, pt states that she hit above her eye and yesterday (2 days after she hit her head) she began seeing the black eye and it has moved down over the last day.  Pt is alert and oriented, no problem with her eye (she didn't strike it)

## 2016-05-22 NOTE — ED Notes (Signed)
Patient transported to CT 

## 2016-05-23 ENCOUNTER — Other Ambulatory Visit: Payer: Self-pay | Admitting: Family Medicine

## 2016-05-24 ENCOUNTER — Telehealth: Payer: Self-pay

## 2016-05-24 NOTE — Telephone Encounter (Signed)
Per chart review tab pt was seen [facility] 05/22/16.

## 2016-05-24 NOTE — Telephone Encounter (Signed)
PLEASE NOTE: All timestamps contained within this report are represented as [place] Standard Time. CONFIDENTIALTY NOTICE: This fax transmission is intended only for the addressee. It contains information that is legally privileged, confidential or otherwise protected from use or disclosure. If you are not the intended recipient, you are strictly prohibited from reviewing, disclosing, copying using or disseminating any of this information or taking any action in reliance on or regarding this information. If you have received this fax in error, please notify us immediately by telephone so that we can arrange for its return to [place]. Phone: [phone], Toll-Free: [phone], Fax: [phone] Page: 1 of 2 Call Id: [id number] [place] Patient Name: [name] Gender: Female DOB: August 26, 1950 Age: 65 Y 55 M 9 D Return Phone Number: [phone] (Primary), [phone] (Secondary) Address: City/State/Zip:  Client [place] Night - Client Client Site [facility] Physician Eliezer Lofts - MD Contact Type Call Who Is Calling Patient / Member / Family / Caregiver Call Type Triage / Clinical Caller Name [name] Relationship To Patient Self Return Phone Number [phone] (Primary) Chief Complaint EYE - struck or hit in the eye Reason for Call Symptomatic / Request for [place] fell a few nights ago and hit her head. It has caused a (purple) black eye. The bruising is getting bigger like it's moving down her cheek. The bruise feels tender when she moves her eye. PreDisposition Call a family member Translation No Nurse Assessment Nurse: Lovena Le, RN, Truman Hayward Date/Time (Eastern Time): 05/22/2016 7:47:29 PM Confirm and document reason for call. If symptomatic, describe symptoms. ---Caller fell a few nights ago and hit her  head. It has caused a (purple) black eye. The bruising is getting bigger like it's moving down her cheek. The bruise feels tender when she moves her eye. Does not recall how it happened. Does the patient have any new or worsening symptoms? ---Yes Will a triage be completed? ---Yes Related visit to physician within the last 2 weeks? ---No Does the PT have any chronic conditions? (i.e. diabetes, asthma, etc.) ---Yes List chronic conditions. ---HTN GERD Is this a behavioral health or substance abuse call? ---No Guidelines Guideline Title Affirmed Question Affirmed Notes Nurse Date/Time Eilene Ghazi Time) Head Injury Can't remember what happened (amnesia) Lovena Le, RN, Truman Hayward 05/22/2016 7:48:36 PM Disp. Time Eilene Ghazi Time) Disposition Final User 05/22/2016 7:46:22 PM Send To RN Personal Logan Bores 05/22/2016 7:49:15 PM Go to ED Now Yes Lovena Le, RN, [place] NOTE: All timestamps contained within this report are represented as [place] Standard Time. CONFIDENTIALTY NOTICE: This fax transmission is intended only for the addressee. It contains information that is legally privileged, confidential or otherwise protected from use or disclosure. If you are not the intended recipient, you are strictly prohibited from reviewing, disclosing, copying using or disseminating any of this information or taking any action in reliance on or regarding this information. If you have received this fax in error, please notify us immediately by telephone so that we can arrange for its return to [place]. Phone: [phone], Toll-Free: [phone], Fax: [phone] Page: 2 of 2 Call Id: [id number] [place] Understands: Yes Disagree/Comply: Comply Care Advice Given Per Guideline GO TO ED NOW: You need to be seen in the Emergency Department. Go to the ER at ___________ [place] now. Drive carefully. DRIVING: Another adult should drive. CARE ADVICE given per Head Injury (Adult) guideline. Referrals GO TO FACILITY  UNDECIDED

## 2016-05-26 ENCOUNTER — Ambulatory Visit
Admission: RE | Admit: 2016-05-26 | Discharge: 2016-05-26 | Disposition: A | Discharge status: Home / Self Care | Payer: BLUE CROSS/BLUE SHIELD | Source: Ambulatory Visit | Attending: Family Medicine | Admitting: Family Medicine

## 2016-05-26 DIAGNOSIS — Z1231 Encounter for screening mammogram for malignant neoplasm of breast: Secondary | ICD-10-CM | POA: Insufficient documentation

## 2016-05-26 IMAGING — MG MM DIGITAL SCREENING BILAT W/ CAD
4 series · 4 of 4 positions shown · non-contrast
Comparison: Previous exam(s).

CLINICAL DATA: Screening.

EXAM:
DIGITAL SCREENING BILATERAL MAMMOGRAM WITH CAD

[R CC]
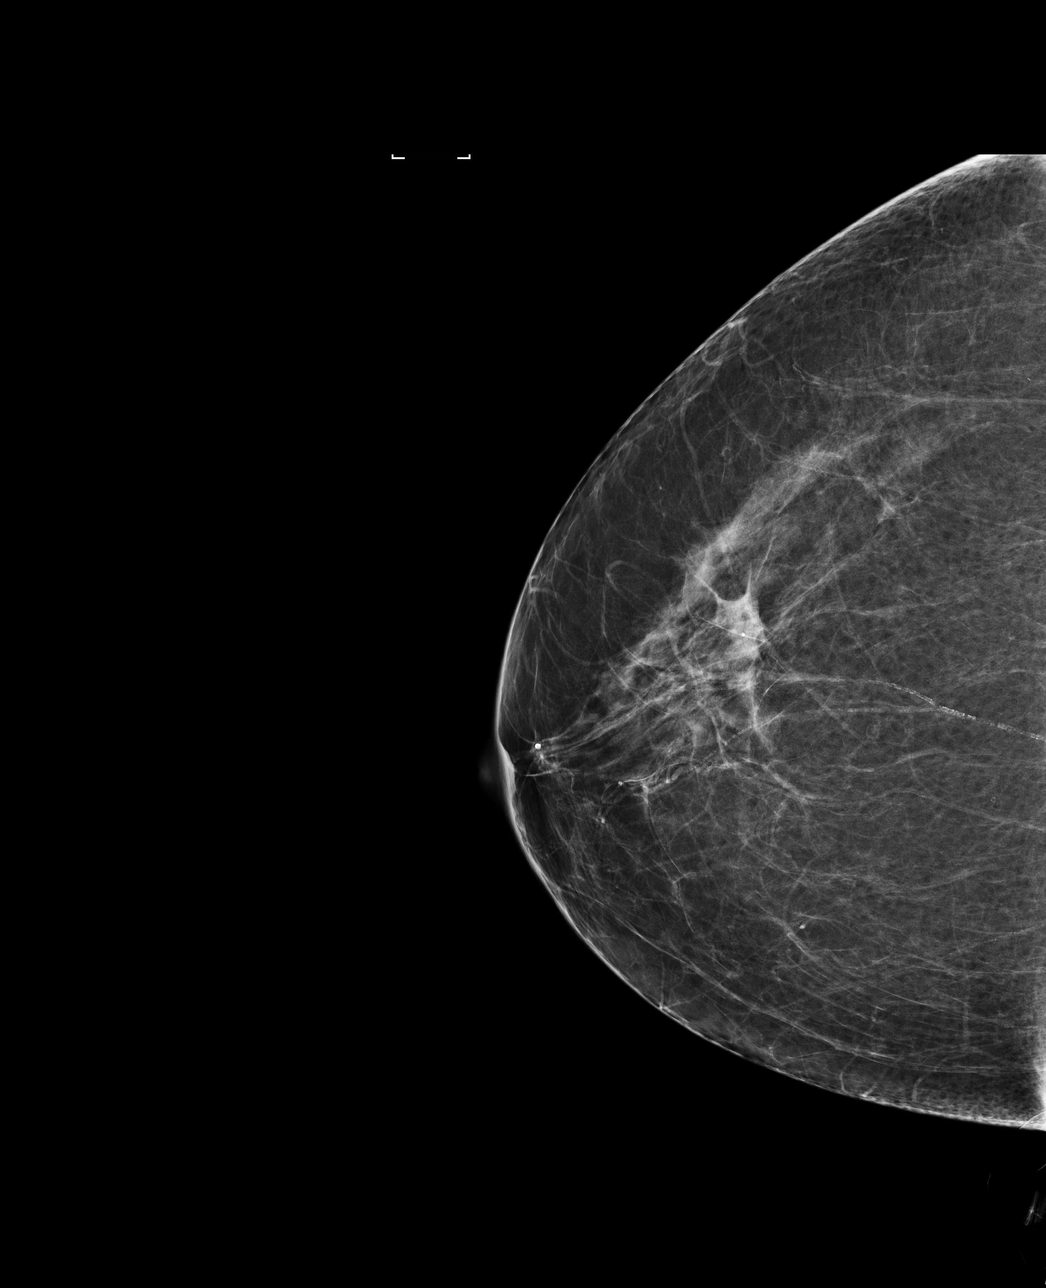

[L CC]
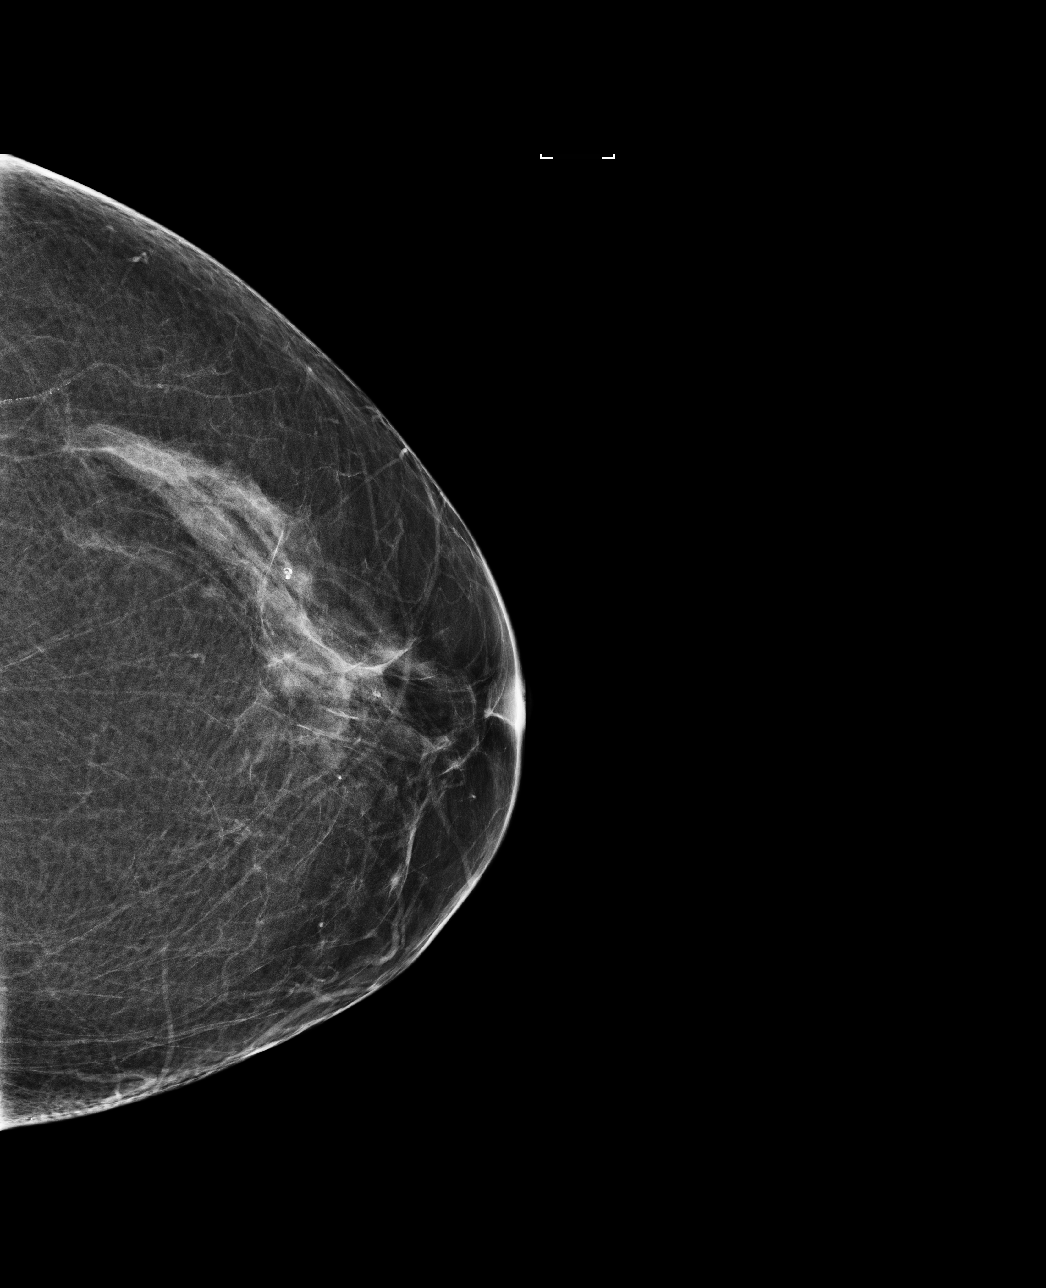

[L MLO]
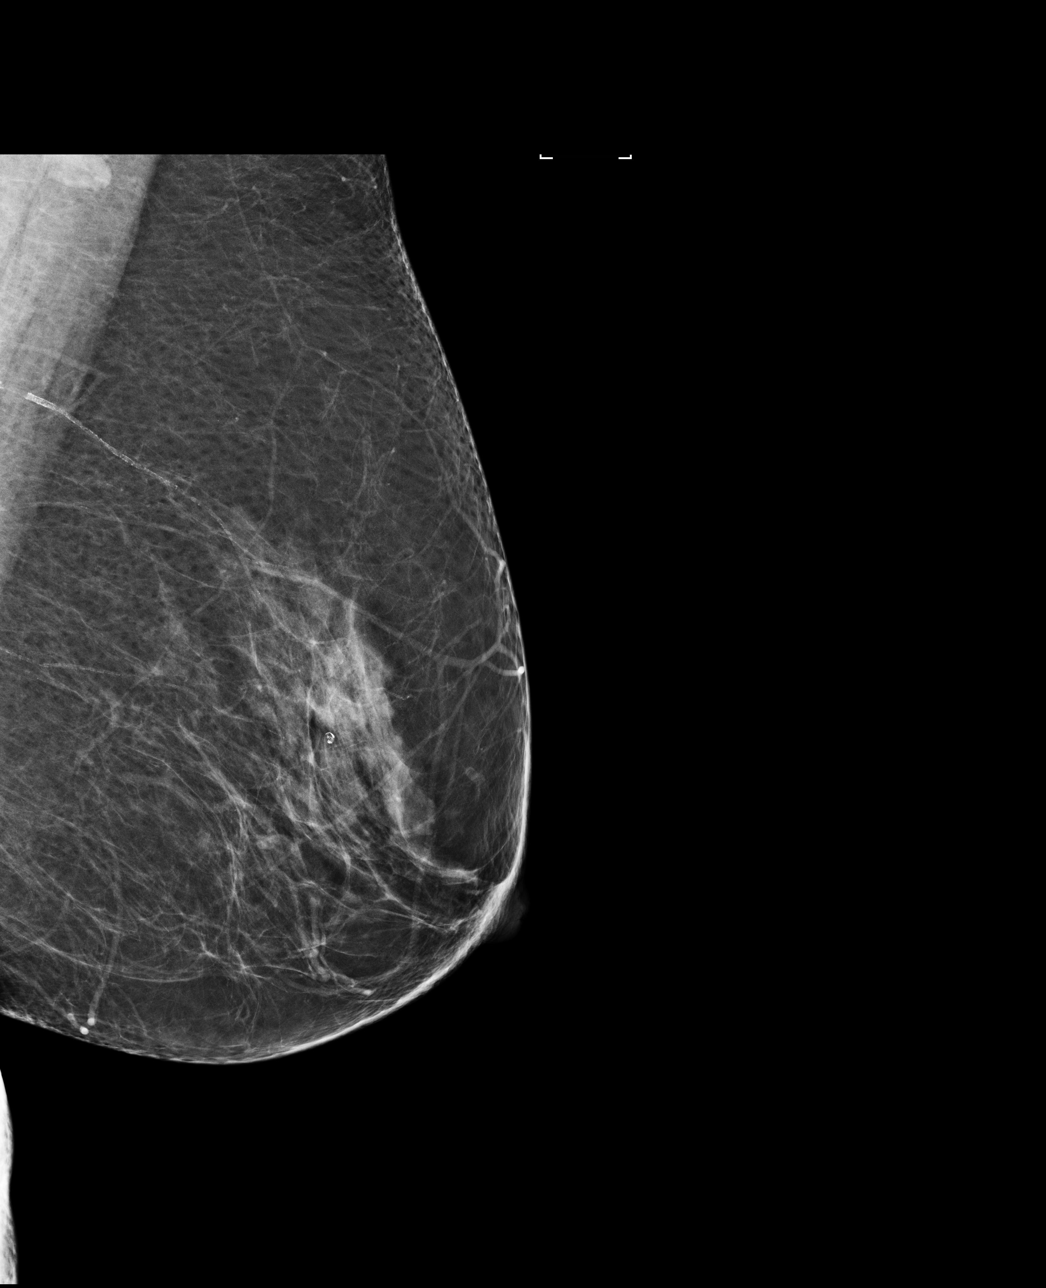

[R MLO]
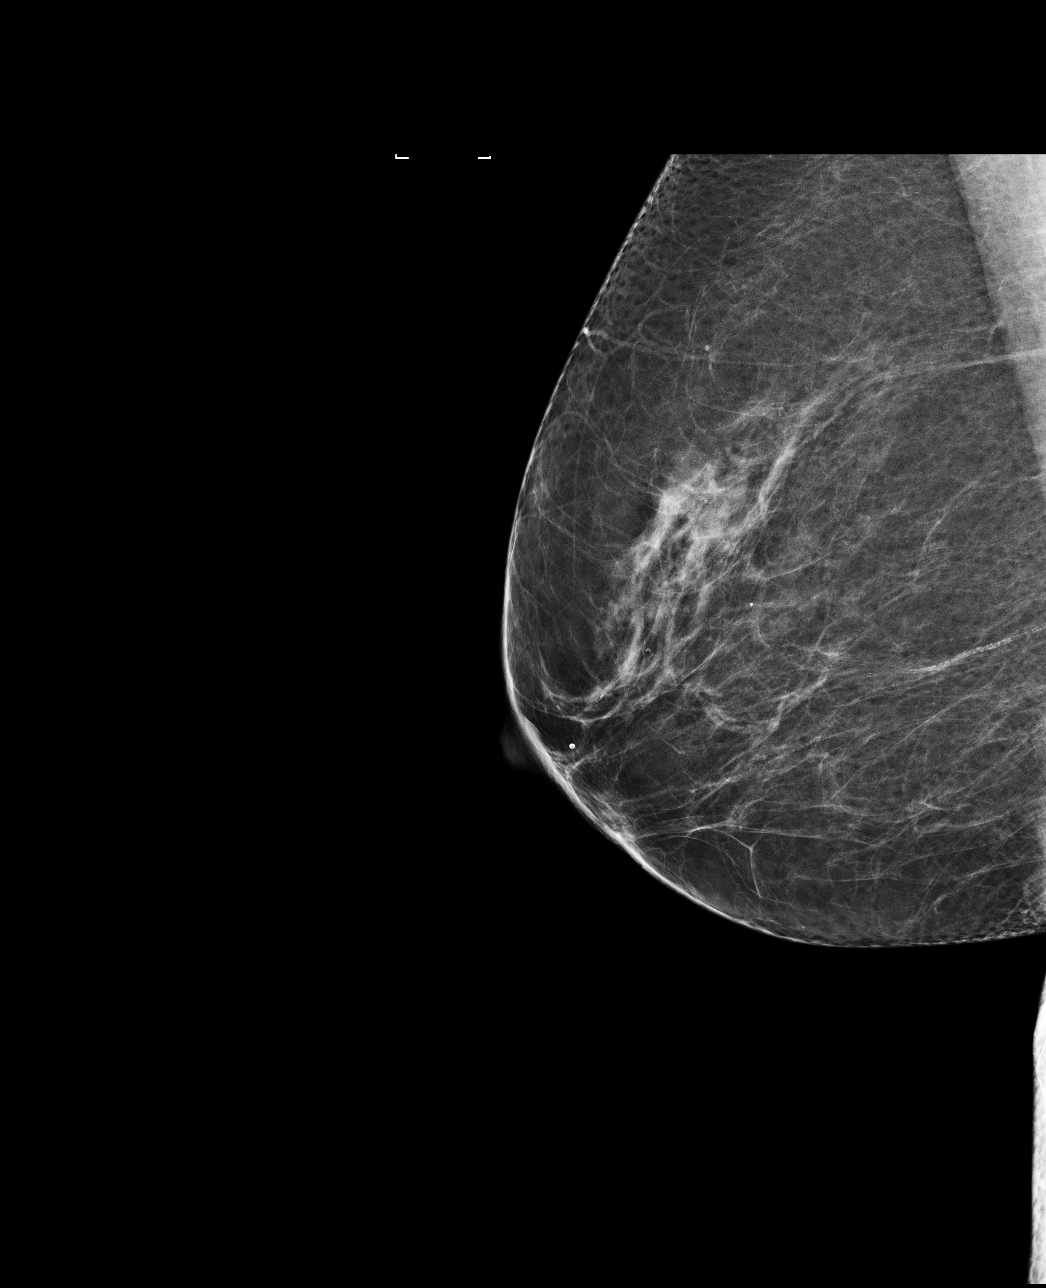

[4 of 4 positions shown; findings below may reference images not displayed]

ACR Breast Density Category b: There are scattered areas of
fibroglandular density.
FINDINGS: There are no findings suspicious for malignancy. Images were
processed with CAD.
IMPRESSION: No mammographic evidence of malignancy. A result letter of this
screening mammogram will be mailed directly to the patient.

RECOMMENDATION:
Screening mammogram in one year. (Code:[US])

BI-RADS CATEGORY  1: Negative.

## 2016-06-01 ENCOUNTER — Telehealth: Payer: Self-pay

## 2016-06-01 NOTE — Telephone Encounter (Signed)
PLEASE NOTE: All timestamps contained within this report are represented as [place] Standard Time. CONFIDENTIALTY NOTICE: This fax transmission is intended only for the addressee. It contains information that is legally privileged, confidential or otherwise protected from use or disclosure. If you are not the intended recipient, you are strictly prohibited from reviewing, disclosing, copying using or disseminating any of this information or taking any action in reliance on or regarding this information. If you have received this fax in error, please notify us immediately by telephone so that we can arrange for its return to [place]. Phone: [phone], Toll-Free: [phone], Fax: [phone] Page: 1 of 1 Call Id: [id number] [place] Patient Name: [name] Gender: Female DOB: [birth year] Age: 65 Y 88 M 17 D Return Phone Number: [phone] (Primary), [phone] (Secondary) Address: City/State/Zip: [place] Night - Client Client Site [facility] Physician Eliezer Lofts - MD Contact Type Call Who Is Calling Patient / Member / Family / Caregiver Call Type Triage / Clinical Relationship To Patient Self Return Phone Number [phone] (Primary) Chief Complaint Cough Reason for Call Symptomatic / Request for Health Information Initial Comment CB @ 11pm EST - Caller states started getting sick yesterday morning, coughing for a month......coughing up green yellow phlegm and blowing nose a lot. Translation No Nurse Assessment Guidelines Guideline Title Affirmed Question Affirmed Notes Nurse Date/Time (Eastern Time) Disp. Time Eilene Ghazi Time) Disposition Final User 05/31/2016 4:06:20 PM FINAL ATTEMPT MADE - no message left Yes [place], Therapist, sports, [employer]

## 2016-06-01 NOTE — Telephone Encounter (Signed)
I will see her then  

## 2016-06-01 NOTE — Telephone Encounter (Signed)
Pt already has appt 06/02/16 at 11:45 with Dr Glori Bickers.

## 2016-06-02 ENCOUNTER — Ambulatory Visit: Payer: BLUE CROSS/BLUE SHIELD | Admitting: Family Medicine

## 2016-06-04 ENCOUNTER — Telehealth: Payer: Self-pay | Admitting: Family Medicine

## 2016-06-04 NOTE — Telephone Encounter (Signed)
°  Patient Name: [name]  DOB: 05-31-1951    Initial Comment Caller states that she has cold symptoms, sore throat, and a cough but she is wheezing   Nurse Assessment  Nurse: Julien Girt, RN, Almyra Free Date/Time Eilene Ghazi Time): 06/04/2016 9:38:23 AM  Confirm and document reason for call. If symptomatic, describe symptoms. ---Caller states that she had cold symptoms, and sore throat and was seen by Dr. Glori Bickers on 05/22/16 who advised that this was viral. She developed a cough with wheezing, still had blisters in her throat and the cold sx, and was seen at an UC on 06/01/16 and was dx with Bronchitis. She was given an rx for Doxycycline. States the cough is better but she is still wheezing, the sore throat has resolved, still with nasal congestion. Today she has a rash on her torso and back which she thinks could be from the abx.  Does the patient have any new or worsening symptoms? ---Yes  Will a triage be completed? ---Yes  Related visit to physician within the last 2 weeks? ---Yes  Does the PT have any chronic conditions? (i.e. diabetes, asthma, etc.) ---Yes  List chronic conditions. ---Htn, GERD, Seasonal allergies  Is this a behavioral health or substance abuse call? ---No     Guidelines    Guideline Title Affirmed Question Affirmed Notes  Cough - Acute Productive Wheezing is present   Rash - Widespread On Drugs Taking new prescription antibiotic (EXCEPTION: finished taking new prescription antibiotic)    Final Disposition User   See Physician within 24 Hours Julien Girt, RN, [place] discussed multiple sx throughout conversation. Could not remember specific dates of treatment or start of meds. Added she does have inhalers rx from allergist but they have all expired. Advised to proceed back to UC as there are no appointments available. She verbalized understanding and will cb as needed.   Referrals  REFERRED TO PCP OFFICE  GO TO FACILITY UNDECIDED  REFERRED TO PCP OFFICE        Disagree/Comply: Comply   User: Eloisa Northern, RN Date/Time Eilene Ghazi Time): 06/04/2016 10:12:19 AM Butch Penny discussed multiple sx throughout conversation. Could not remember specific dates of treatment or start of meds. Added she does have inhalers rx from allergist but they have all expired. Advised to proceed back to UC as there are no appointments available. She verbalized understanding and will cb as needed.

## 2016-06-10 ENCOUNTER — Other Ambulatory Visit: Payer: Self-pay | Admitting: Family Medicine

## 2016-06-14 ENCOUNTER — Encounter: Payer: Self-pay | Admitting: Family Medicine

## 2016-06-14 ENCOUNTER — Ambulatory Visit (INDEPENDENT_AMBULATORY_CARE_PROVIDER_SITE_OTHER): Payer: BLUE CROSS/BLUE SHIELD | Admitting: Family Medicine

## 2016-06-14 VITALS — BP 120/70 | HR 59 | Temp 98.8°F | Ht 64.25 in | Wt 182.5 lb

## 2016-06-14 DIAGNOSIS — R05 Cough: Secondary | ICD-10-CM

## 2016-06-14 DIAGNOSIS — R059 Cough, unspecified: Secondary | ICD-10-CM

## 2016-06-14 DIAGNOSIS — J208 Acute bronchitis due to other specified organisms: Secondary | ICD-10-CM | POA: Diagnosis not present

## 2016-06-14 MED ORDER — LEVOFLOXACIN 500 MG PO TABS: 500.0000 mg | ORAL_TABLET | Freq: Every day | ORAL | 0 refills | Status: DC

## 2016-06-14 MED ORDER — GUAIFENESIN-CODEINE 100-10 MG/5ML PO SYRP: 5.0000 mL | ORAL_SOLUTION | Freq: Four times a day (QID) | ORAL | 0 refills | Status: DC | PRN

## 2016-06-14 NOTE — Progress Notes (Signed)
Dr. Frederico Hamman T. Maxxon Schwanke, MD, [facility] [place], [address] Phone: [phone] Fax: [phone]  06/14/2016  Patient: CASSIE SHEDLOCK, MRN: [id number], DOB: [birth year], 66 y.o.  Primary Physician:  Eliezer Lofts, MD   Chief Complaint  Patient presents with  . Hoarse  . Nasal Congestion  . Cough   Subjective:   JULY LINAM is a 66 y.o. very pleasant female patient who presents with the following:  Woke up and having difficulty talking.  [facility]ent to [facility] - had a sore throat.  Started to get sick since November - amox and doxy Did clear up some.  Cough never went away Now feeling worse than ever.   Now feeling worse and coughing up green and yellow.   Past Medical History, Surgical History, Social History, Family History, Problem List, Medications, and Allergies have been reviewed and updated if relevant.  Patient Active Problem List   Diagnosis Date Noted  . Viral URI with cough 05/10/2016  . Sore throat 05/03/2016  . Reactive airway disease 04/24/2013  . Paget's disease of vulva 05/01/2012  . COLONIC POLYPS, ADENOMATOUS, HX OF 12/11/2008  . Migraine without aura 10/10/2008  . Essential hypertension, benign 10/10/2008  . Allergic rhinitis 10/10/2008  . GERD 10/10/2008  . PERIMENOPAUSAL SYNDROME 10/10/2008  . OSTEOARTHRITIS 10/10/2008  . DEGENERATIVE DISC DISEASE, CERVICAL SPINE 10/10/2008    Past Medical History:  Diagnosis Date  . Allergic rhinitis   . Arthritis   . GERD (gastroesophageal reflux disease)   . Headache(784.0)    Migraines  . History of adenomatous polyp of colon   . History of concussion    AS CHILD--  NO RESIDUAL  . History of palpitations   . Hypertension   . Paget's disease of vulva   . PONV (postoperative nausea and vomiting)    SEVERE  . Wears glasses     Past Surgical History:  Procedure Laterality Date  . mucoid cyst removal thumb    . PULLERY RELEASE LEFT THUMB, LEFT  RING FINGER/  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  02-17-2011  . PULLEY RELEASE RIGHT THUMB  03-23-2011  . SIMPLE VULVECTOMY  03-24-2010   right side  . TONSILLECTOMY  1977  . TRIGGER FINGER RELEASE    . VAGINAL HYSTERECTOMY  1989   and Anterior and posterior repair's for prolapse  . VULVECTOMY Right 01/10/2015   Procedure: RIGHT WIDE LOCAL EXCISION VULVECTOMY;  Surgeon: [name], MD;  Location[facility];  Service: Gynecology;  Laterality: Right;  Marland Kitchen VULVECTOMY Right 04/10/2015   Procedure: WIDE LOCAL EXCISION VULVA;  Surgeon: [name], MD;  Location: [facility];  Service: Gynecology;  Laterality: Right;  . WISDOM TOOTH EXTRACTION      Social History   Social History  . Marital status: Married    Spouse name: N/A  . Number of children: N/A  . Years of education: N/A   Occupational History  . Not on file.   Social History Main Topics  . Smoking status: Former Smoker    Years: 5.00    Types: Cigarettes    Quit date: 04/15/1989  . Smokeless tobacco: Never Used  . Alcohol use 0.6 oz/week    1 Glasses of wine per week     Comment: occasional wine  . Drug use: No  . Sexual activity: Not on file   Other Topics Concern  . Not on file  Social History Narrative  . No narrative on file    Family History  Problem Relation Age of Onset  . Cancer Maternal Grandmother   . Hypertension Father   . Heart disease Father   . Hypertension Mother   . Heart disease Mother   . Breast cancer Paternal Grandmother 5  . Colon cancer Paternal Uncle     Allergies  Allergen Reactions  . Ibuprofen Hypertension  . Naprosyn [Naproxen] Nausea Only  . Other     Allergic to toothpaste- makes mouth peel Hay fever/dust mites/trees "365 allergic"   . Azithromycin Itching and Rash  . Sulfa Antibiotics Hives and Rash    Medication list reviewed and updated in full in [place].  ROS: GEN: Acute  illness details above GI: Tolerating PO intake GU: maintaining adequate hydration and urination Pulm: No SOB Interactive and getting along well at home.  Otherwise, ROS is as per the HPI.  Objective:   BP 120/70   Pulse (!) 59   Temp 98.8 F (37.1 C) (Oral)   Ht 5' 4.25" (1.632 m)   Wt 182 lb 8 oz (82.8 kg)   BMI 31.08 kg/m    GEN: A and O x 3. WDWN. NAD.    ENT: Nose clear, ext NML.  No LAD.  No JVD.  TM's clear. Oropharynx clear.  PULM: Normal WOB, no distress. No crackles, wheezes, rhonchi. CV: RRR, no M/G/R, No rubs, No JVD.   EXT: warm and well-perfused, No c/c/e. PSYCH: Pleasant and conversant.    Laboratory and Imaging Data:  Assessment and Plan:   Acute bronchitis due to other specified organisms  Cough  With time approaching 2 mo, cannot exclude pertussis. Has already been treated with doxy. Multiple viruses are also possible. Cover pertussis and atypicals.  Follow-up: No Follow-up on file.  Meds ordered this encounter  Medications  . levofloxacin (LEVAQUIN) 500 MG tablet    Sig: Take 1 tablet (500 mg total) by mouth daily.    Dispense:  10 tablet    Refill:  0  . guaiFENesin-codeine (ROBITUSSIN AC) 100-10 MG/5ML syrup    Sig: Take 5 mLs by mouth 4 (four) times daily as needed for cough. If not working or driving since it can sedate    Dispense:  120 mL    Refill:  0   Medications Discontinued During This Encounter  Medication Reason  . amoxicillin (AMOXIL) 500 MG capsule Completed Course  . guaiFENesin-codeine (ROBITUSSIN AC) 100-10 MG/5ML syrup Reorder   No orders of the defined types were placed in this encounter.   Signed,  Maud Deed. Sabriyah Wilcher, MD   Allergies as of 06/14/2016      Reactions   Ibuprofen Hypertension   Naprosyn [naproxen] Nausea Only   Other    Allergic to toothpaste- makes mouth peel Hay fever/dust mites/trees "365 allergic"    Azithromycin Itching, Rash   Sulfa Antibiotics Hives, Rash      Medication List         Accurate as of 06/14/16  1:55 PM. Always use your most recent med list.          albuterol 108 (90 Base) MCG/ACT inhaler Commonly known as:  PROVENTIL HFA;VENTOLIN HFA Inhale 2 puffs into the lungs every 6 (six) hours as needed. Reported on 10/24/2015   DERMASORB TA 0.1 % Kit   fluticasone 50 MCG/ACT nasal spray Commonly known as:  FLONASE Place 2 sprays into the nose daily as needed.   guaiFENesin-codeine 100-10 MG/5ML syrup  Commonly known as:  ROBITUSSIN AC Take 5 mLs by mouth 4 (four) times daily as needed for cough. If not working or driving since it can sedate   lansoprazole 30 MG capsule Commonly known as:  PREVACID Take 30 mg by mouth 2 (two) times daily before a meal.   levofloxacin 500 MG tablet Commonly known as:  LEVAQUIN Take 1 tablet (500 mg total) by mouth daily.   losartan-hydrochlorothiazide 100-25 MG tablet Commonly known as:  HYZAAR TAKE 1 TABLET DAILY IN THE MORNING   metoprolol tartrate 25 MG tablet Commonly known as:  LOPRESSOR Take one-fourth ( 6.25 mg) tablet by mouth two times a day.   montelukast 10 MG tablet Commonly known as:  SINGULAIR TAKE 1 TABLET EVERY MORNING   triamcinolone ointment 0.5 % Commonly known as:  KENALOG Apply 1 application topically 2 (two) times daily.   TUSSIN COUGH PO Take by mouth daily as needed.   UNISOM 25 MG tablet Generic drug:  doxylamine (Sleep) Take 25 mg by mouth at bedtime as needed.

## 2016-06-14 NOTE — Progress Notes (Signed)
Pre visit review using our clinic review tool, if applicable. No additional management support is needed unless otherwise documented below in the visit note.  Robitussin AC called into [place]. [facility].

## 2016-06-17 ENCOUNTER — Other Ambulatory Visit: Payer: Self-pay | Admitting: Family Medicine

## 2016-06-17 ENCOUNTER — Other Ambulatory Visit: Payer: Self-pay

## 2016-06-17 MED ORDER — ALBUTEROL SULFATE HFA 108 (90 BASE) MCG/ACT IN AERS: 2.0000 | INHALATION_SPRAY | Freq: Four times a day (QID) | RESPIRATORY_TRACT | 0 refills | Status: DC | PRN

## 2016-06-17 NOTE — Telephone Encounter (Signed)
Pt seen 06/14/16 and pt thought had inhaler at home but was expired and request albuterol inh to [facility] [address]. Advised done and pt will cb if does not improve.

## 2016-06-21 ENCOUNTER — Encounter: Payer: Self-pay | Admitting: Family Medicine

## 2016-06-21 ENCOUNTER — Ambulatory Visit (INDEPENDENT_AMBULATORY_CARE_PROVIDER_SITE_OTHER): Payer: BLUE CROSS/BLUE SHIELD | Admitting: Family Medicine

## 2016-06-21 ENCOUNTER — Ambulatory Visit (INDEPENDENT_AMBULATORY_CARE_PROVIDER_SITE_OTHER)
Admission: RE | Admit: 2016-06-21 | Discharge: 2016-06-21 | Disposition: A | Discharge status: Home / Self Care | Payer: BLUE CROSS/BLUE SHIELD | Source: Ambulatory Visit | Attending: Family Medicine | Admitting: Family Medicine

## 2016-06-21 ENCOUNTER — Telehealth: Payer: Self-pay | Admitting: Family Medicine

## 2016-06-21 VITALS — BP 130/80 | HR 73 | Temp 98.4°F | Ht 64.25 in | Wt 180.0 lb

## 2016-06-21 DIAGNOSIS — J208 Acute bronchitis due to other specified organisms: Secondary | ICD-10-CM

## 2016-06-21 DIAGNOSIS — R059 Cough, unspecified: Secondary | ICD-10-CM

## 2016-06-21 DIAGNOSIS — R05 Cough: Secondary | ICD-10-CM | POA: Diagnosis not present

## 2016-06-21 IMAGING — DX DG CHEST 2V
2 series · 2 of 2 positions shown · non-contrast
Comparison: [DATE]

CLINICAL DATA: Acute bronchitis.

EXAM:
CHEST  2 VIEW

[chest pa]
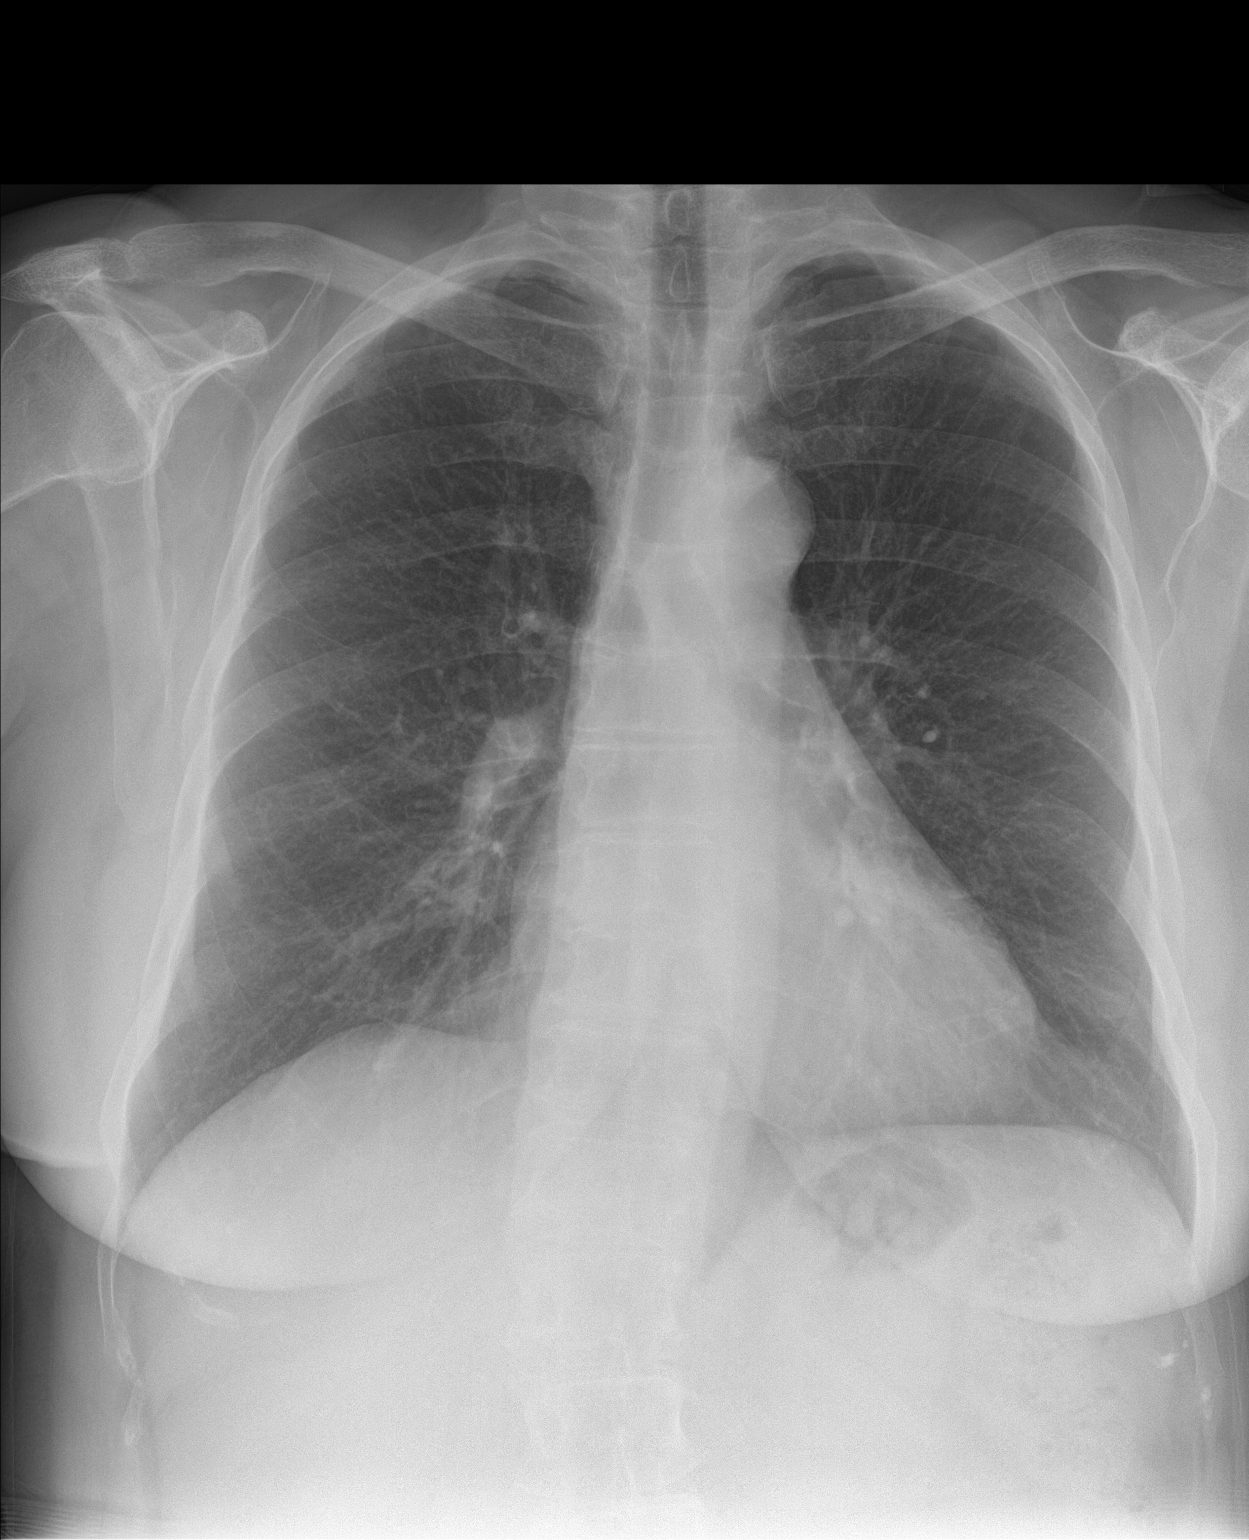

[chest lat]
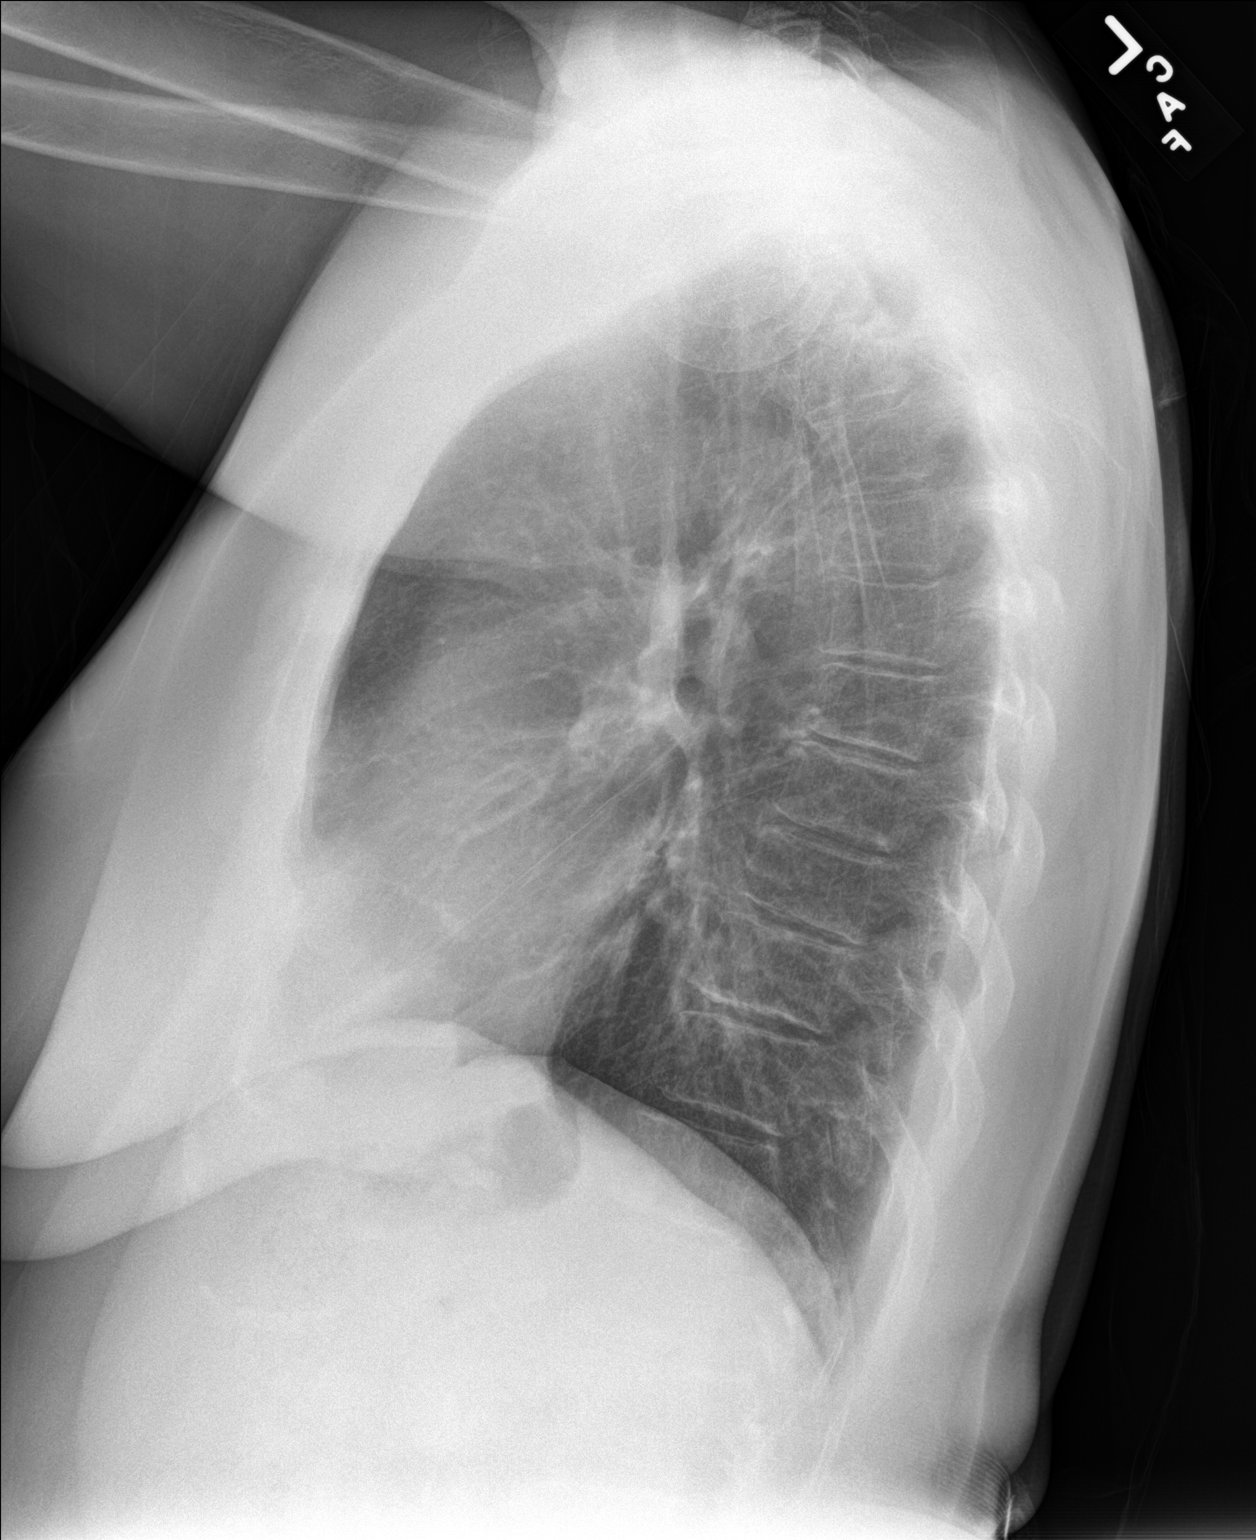

[2 of 2 positions shown; findings below may reference images not displayed]

FINDINGS: Normal heart size and mediastinal contours. No acute infiltrate or
edema. Similar appearance of mild biapical pleural based opacity
that is likely scarring. No effusion or pneumothorax. No acute
osseous findings.
IMPRESSION: Stable compared to prior.  No evidence of acute disease.

## 2016-06-21 NOTE — Telephone Encounter (Signed)
Appointment with Dr. Lorelei Pont 06/21/2016 at 11:00 am.

## 2016-06-21 NOTE — Progress Notes (Signed)
Pre visit review using our clinic review tool, if applicable. No additional management support is needed unless otherwise documented below in the visit note. 

## 2016-06-21 NOTE — Progress Notes (Signed)
Dr. Frederico Hamman T. Allean Montfort, MD, [facility] Medicine [place], [address] Phone: [phone] Fax: [phone]  06/21/2016  Patient: [name], MRN: [id number], DOB: Jan 16, 1951, 66 y.o.  Primary Physician:  Eliezer Lofts, MD   Chief Complaint  Patient presents with  . Cough    worse at night  . Sinus Drainage   Subjective:   [name] is a 66 y.o. very pleasant female patient who presents with the following:  Patient seen last week, placed on Levaquin. Seen with bronchitis and other resp c/o in the last 2 months. Cough is a little better, but persistent wheezing.  CXR is clear    Past Medical History, Surgical History, Social History, Family History, Problem List, Medications, and Allergies have been reviewed and updated if relevant.  Patient Active Problem List   Diagnosis Date Noted  . Viral URI with cough 05/10/2016  . Sore throat 05/03/2016  . Reactive airway disease 04/24/2013  . Paget's disease of vulva 05/01/2012  . COLONIC POLYPS, ADENOMATOUS, HX OF 12/11/2008  . Migraine without aura 10/10/2008  . Essential hypertension, benign 10/10/2008  . Allergic rhinitis 10/10/2008  . GERD 10/10/2008  . PERIMENOPAUSAL SYNDROME 10/10/2008  . OSTEOARTHRITIS 10/10/2008  . DEGENERATIVE DISC DISEASE, CERVICAL SPINE 10/10/2008    Past Medical History:  Diagnosis Date  . Allergic rhinitis   . Arthritis   . GERD (gastroesophageal reflux disease)   . Headache(784.0)    Migraines  . History of adenomatous polyp of colon   . History of concussion    AS CHILD--  NO RESIDUAL  . History of palpitations   . Hypertension   . Paget's disease of vulva   . PONV (postoperative nausea and vomiting)    SEVERE  . Wears glasses     Past Surgical History:  Procedure Laterality Date  . mucoid cyst removal thumb    . PULLERY RELEASE LEFT THUMB, LEFT RING FINGER/  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   02-17-2011  . PULLEY RELEASE RIGHT THUMB  03-23-2011  . SIMPLE VULVECTOMY  03-24-2010   right side  . TONSILLECTOMY  1977  . TRIGGER FINGER RELEASE    . VAGINAL HYSTERECTOMY  1989   and Anterior and posterior repair's for prolapse  . VULVECTOMY Right 01/10/2015   Procedure: RIGHT WIDE LOCAL EXCISION VULVECTOMY;  Surgeon: [name], MD;  Location: [facility];  Service: Gynecology;  Laterality: Right;  Marland Kitchen VULVECTOMY Right 04/10/2015   Procedure: WIDE LOCAL EXCISION VULVA;  Surgeon: [name], MD;  Location[facility];  Service: Gynecology;  Laterality: Right;  . WISDOM TOOTH EXTRACTION      Social History   Social History  . Marital status: Married    Spouse name: N/A  . Number of children: N/A  . Years of education: N/A   Occupational History  . Not on file.   Social History Main Topics  . Smoking status: Former Smoker    Years: 5.00    Types: Cigarettes    Quit date: 04/15/1989  . Smokeless tobacco: Never Used  . Alcohol use 0.6 oz/week    1 Glasses of wine per week     Comment: occasional wine  . Drug use: No  . Sexual activity: Not on file   Other Topics Concern  . Not on file   Social History Narrative  . No narrative on file    Family History  Problem  Relation Age of Onset  . Cancer Maternal Grandmother   . Hypertension Father   . Heart disease Father   . Hypertension Mother   . Heart disease Mother   . Breast cancer Paternal Grandmother 27  . Colon cancer Paternal Uncle     Allergies  Allergen Reactions  . Ibuprofen Hypertension  . Naprosyn [Naproxen] Nausea Only  . Other     Allergic to toothpaste- makes mouth peel Hay fever/dust mites/trees "365 allergic"   . Azithromycin Itching and Rash  . Sulfa Antibiotics Hives and Rash    Medication list reviewed and updated in full in [place].  ROS: GEN: Acute illness details above GI: Tolerating PO intake GU: maintaining adequate  hydration and urination Pulm: No SOB Interactive and getting along well at home.  Otherwise, ROS is as per the HPI.  Objective:   BP 130/80   Pulse 73   Temp 98.4 F (36.9 C) (Oral)   Ht 5' 4.25" (1.632 m)   Wt 180 lb (81.6 kg)   SpO2 97%   BMI 30.66 kg/m    GEN: A and O x 3. WDWN. NAD.    ENT: Nose clear, ext NML.  No LAD.  No JVD.  TM's clear. Oropharynx clear.  PULM: Normal WOB, no distress. No crackles, + wheezes, no rhonchi. CV: RRR, no M/G/R, No rubs, No JVD.   EXT: warm and well-perfused, No c/c/e. PSYCH: Pleasant and conversant.    Laboratory and Imaging Data:  Assessment and Plan:   Acute bronchitis due to other specified organisms - Plan: DG Chest 2 View  Cough - Plan: DG Chest 2 View  CXR clear on my read await formal.  Cont current POC Add prn albuterol  Follow-up: No Follow-up on file.  No orders of the defined types were placed in this encounter.  There are no discontinued medications. Orders Placed This Encounter  Procedures  . DG Chest 2 View    Signed,  Frederico Hamman T. Kawthar Ennen, MD   Allergies as of 06/21/2016      Reactions   Ibuprofen Hypertension   Naprosyn [naproxen] Nausea Only   Other    Allergic to toothpaste- makes mouth peel Hay fever/dust mites/trees "365 allergic"    Azithromycin Itching, Rash   Sulfa Antibiotics Hives, Rash      Medication List       Accurate as of 06/21/16 11:59 AM. Always use your most recent med list.          albuterol 108 (90 Base) MCG/ACT inhaler Commonly known as:  PROVENTIL HFA;VENTOLIN HFA Inhale 2 puffs into the lungs every 6 (six) hours as needed. Reported on 10/24/2015   DERMASORB TA 0.1 % Kit   fluticasone 50 MCG/ACT nasal spray Commonly known as:  FLONASE Place 2 sprays into the nose daily as needed.   guaiFENesin-codeine 100-10 MG/5ML syrup Commonly known as:  ROBITUSSIN AC Take 5 mLs by mouth 4 (four) times daily as needed for cough. If not working or driving since it can sedate    lansoprazole 30 MG capsule Commonly known as:  PREVACID Take 30 mg by mouth 2 (two) times daily before a meal.   levofloxacin 500 MG tablet Commonly known as:  LEVAQUIN Take 1 tablet (500 mg total) by mouth daily.   losartan-hydrochlorothiazide 100-25 MG tablet Commonly known as:  HYZAAR TAKE 1 TABLET DAILY IN THE MORNING   metoprolol tartrate 25 MG tablet Commonly known as:  LOPRESSOR Take one-fourth ( 6.25 mg) tablet by mouth  two times a day.   montelukast 10 MG tablet Commonly known as:  SINGULAIR TAKE 1 TABLET EVERY MORNING   triamcinolone ointment 0.5 % Commonly known as:  KENALOG Apply 1 application topically 2 (two) times daily.   TUSSIN COUGH PO Take by mouth daily as needed.   UNISOM 25 MG tablet Generic drug:  doxylamine (Sleep) Take 25 mg by mouth at bedtime as needed.

## 2016-06-21 NOTE — Telephone Encounter (Signed)
Patient Name: [name] Gender: Female DOB: July 01, 1950 Age: 66 Y 25 M 6 D Return Phone Number: [phone] (Primary), [phone] (Secondary) Address: City/State/Zip: [place] Night - Client Client Site [facility] Physician Eliezer Lofts - MD Contact Type Call Who Is Calling Patient / Member / Family / Caregiver Call Type Triage / Clinical Relationship To Patient Self Return Phone Number [phone] (Primary) Chief Complaint Cough Reason for Call Symptomatic / Request for Health Information Initial Comment She has Bronchitis and has been coughing a lot. Is needing advice for this. PreDisposition Did not know what to do Translation No Nurse Assessment Nurse: Ann Maki, RN, Sajjad Date/Time (Eastern Time): 06/20/2016 5:22:22 AM Confirm and document reason for call. If symptomatic, describe symptoms. ---caller states on monday she was diagnosed with bronchitis. and has been sick on and off since early november. states it started with a sore throat and has now become a productive cough for the past several days. Does the patient have any new or worsening symptoms? ---Yes Will a triage be completed? ---Yes Related visit to physician within the last 2 weeks? ---Yes Does the PT have any chronic conditions? (i.e. diabetes, asthma, etc.) ---No Is this a behavioral health or substance abuse call? ---No Guidelines Guideline Title Affirmed Question Affirmed Notes Nurse Date/Time Eilene Ghazi Time) Cough - Acute Productive Earache Naseem, RN, Sajjad 06/20/2016 5:26:54 AM Disp. Time Eilene Ghazi Time) Disposition Final User 06/20/2016 5:33:44 AM See Physician within 24 Hours Yes Naseem, RN, Sajjad Caller Understands: Yes Disagree/Comply: Comply PLEASE NOTE: All timestamps contained within this report are represented as [place] Standard Time. CONFIDENTIALTY NOTICE: This fax transmission is intended only for the addressee. It contains  information that is legally privileged, confidential or otherwise protected from use or disclosure. If you are not the intended recipient, you are strictly prohibited from reviewing, disclosing, copying using or disseminating any of this information or taking any action in reliance on or regarding this information. If you have received this fax in error, please notify us immediately by telephone so that we can arrange for its return to [place]. Phone: [phone], Toll-Free: [phone], Fax: [phone] Page: 2 of 2 Call Id: [id number] Care Advice Given Per Guideline SEE PHYSICIAN WITHIN 24 HOURS: * IF OFFICE WILL BE OPEN: You need to be seen within the next 24 hours. Call your doctor when the office opens, and make an appointment. PAIN MEDICINES: ACETAMINOPHEN (E.G., TYLENOL): IBUPROFEN (E.G., MOTRIN, ADVIL): NAPROXEN (E.G., ALEVE): COUGH DROPS FOR COUGH: HOME REMEDY - HARD CANDY: Hard candy works just as well as a medicine-flavored OTC cough drops. * Cough drops can help a lot, especially for mild coughs. They reduce coughing by soothing your irritated throat and removing that tickle sensation in the back of the throat. COUGHING SPELLS: * Suck on cough drops or hard candy to coat the irritated throat. * Drink warm fluids. Inhale warm mist. (Reason: both relax the airway and loosen up the phlegm) CALL BACK IF: * Difficulty breathing occurs * You become worse. CARE ADVICE given per Cough - Acute Productive (Adult) guideline. Referrals REFERRED TO PCP OFFICE

## 2016-06-25 ENCOUNTER — Telehealth: Payer: Self-pay | Admitting: Family Medicine

## 2016-06-25 NOTE — Telephone Encounter (Signed)
Agree with recs.

## 2016-06-25 NOTE — Telephone Encounter (Signed)
[facility] Patient Name: [name] DOB: [birth year] Initial Comment congestion, sinus mucus in nose, being treated for bronchitis Nurse Assessment Nurse: Markus Daft, RN, Sherre Poot Date/Time (Eastern Time): 06/25/2016 2:30:39 PM Confirm and document reason for call. If symptomatic, describe symptoms. ---Caller states that she has nasal congestion with lots of green mucous. She was being treated for bronchitis - finished antibiotic that she started on 06/15/15. Still coughing, but less than she was. Feeling much better. Does the patient have any new or worsening symptoms? ---Yes Will a triage be completed? ---Yes Related visit to physician within the last 2 weeks? ---Yes Does the PT have any chronic conditions? (i.e. diabetes, asthma, etc.) ---No Is this a behavioral health or substance abuse call? ---No Guidelines Guideline Title Affirmed Question Affirmed Notes Sinus Pain or Congestion [1] Sinus congestion as part of a cold AND [2] present < 10 days (all triage questions negative) Final Disposition User Bogata, RN, Windy Disagree/Comply: Comply

## 2016-06-25 NOTE — Telephone Encounter (Signed)
PLEASE NOTE: All timestamps contained within this report are represented as [place] Standard Time. CONFIDENTIALTY NOTICE: This fax transmission is intended only for the addressee. It contains information that is legally privileged, confidential or otherwise protected from use or disclosure. If you are not the intended recipient, you are strictly prohibited from reviewing, disclosing, copying using or disseminating any of this information or taking any action in reliance on or regarding this information. If you have received this fax in error, please notify us immediately by telephone so that we can arrange for its return to [place]. Phone: [phone], Toll-Free: [phone], Fax: [phone] Page: 1 of 2 Call Id: [id number] [place] Patient Name: [name] Gender: Female DOB: 06-10-1950 Age: 66 Y 7 M 11 D Return Phone Number: [phone] (Primary), [phone] (Secondary) Address: City/State/Zip: [place] [address] Client [facility] - Client Client Site [facility] Physician Eliezer Lofts - MD Contact Type Call Who Is Calling Patient / Member / Family / Caregiver Call Type Triage / Clinical Relationship To Patient Self Return Phone Number [phone] (Primary) Chief Complaint Cough Reason for Call Symptomatic / Request for Health Information Initial Comment congestion, sinus mucus in nose, being treated for bronchitis Appointment Disposition EMR Appointment Not Necessary Info pasted into Epic Yes PreDisposition Did not know what to do Translation No Nurse Assessment Nurse: Markus Daft, RN, Sherre Poot Date/Time ([place] Time): 06/25/2016 2:30:39 PM Confirm and document reason for call. If symptomatic, describe symptoms. ---Caller states that she has nasal congestion with lots of green mucous. She was being treated for bronchitis - finished  antibiotic that she started on 06/15/15. Still coughing, but less than she was. Feeling much better. Does the patient have any new or worsening symptoms? ---Yes Will a triage be completed? ---Yes Related visit to physician within the last 2 weeks? ---Yes Does the PT have any chronic conditions? (i.e. diabetes, asthma, etc.) ---No Is this a behavioral health or substance abuse call? ---No Guidelines Guideline Title Affirmed Question Affirmed Notes Nurse Date/Time Eilene Ghazi Time) Sinus Pain or Congestion [1] Sinus congestion as part of a cold AND [2] present < 10 days (all triage questions negative) Markus Daft, RN, Sherre Poot 06/25/2016 2:34:07 PM Disp. Time Eilene Ghazi Time) Disposition Final User 06/25/2016 2:40:10 PM Bynum, RN, [place] PLEASE NOTE: All timestamps contained within this report are represented as [place] Standard Time. CONFIDENTIALTY NOTICE: This fax transmission is intended only for the addressee. It contains information that is legally privileged, confidential or otherwise protected from use or disclosure. If you are not the intended recipient, you are strictly prohibited from reviewing, disclosing, copying using or disseminating any of this information or taking any action in reliance on or regarding this information. If you have received this fax in error, please notify us immediately by telephone so that we can arrange for its return to [place]. Phone: [phone], Toll-Free: [phone], Fax: [phone] Page: 2 of 2 Call Id: [id number] Caller Understands: Yes Disagree/Comply: Comply Care Advice Given Per Guideline HOME CARE: You should be able to treat this at home. REASSURANCE: * Sinus congestion is a normal part of a cold. FOR A STUFFY NOSE - USE NASAL WASHES: * Introduction: Saline (salt water) nasal irrigation (nasal wash) is an effective and simple home remedy for treating stuffy nose and sinus congestion. The nose can be irrigated by pouring, spraying, or squirting  salt water into the nose and then  letting it run back out. * How it Helps: The salt water rinses out excess mucus, washes out any irritants (dust, allergens) that might be present, and moistens the nasal cavity. * Methods: There are several ways to perform nasal irrigation. You can use a saline nasal spray bottle (available over-the-counter), a rubber ear syringe, a medical syringe without the needle, or a NETI POT. * STEP 5: Repeat steps 1-4 for the other nostril. You can do this a couple times a day if it seems to help you. CALL BACK IF: * Severe pain persists over 2 hours after pain medicine * Sinus pain persists over 1 day after using nasal washes * You become worse. CARE ADVICE given per Sinus Pain or Congestion (Adult) guideline.

## 2016-06-29 ENCOUNTER — Ambulatory Visit (INDEPENDENT_AMBULATORY_CARE_PROVIDER_SITE_OTHER): Payer: BLUE CROSS/BLUE SHIELD | Admitting: Family Medicine

## 2016-06-29 ENCOUNTER — Encounter: Payer: Self-pay | Admitting: Family Medicine

## 2016-06-29 ENCOUNTER — Telehealth: Payer: Self-pay

## 2016-06-29 VITALS — BP 138/80 | HR 60 | Temp 98.4°F | Ht 64.25 in | Wt 181.5 lb

## 2016-06-29 DIAGNOSIS — R05 Cough: Secondary | ICD-10-CM

## 2016-06-29 DIAGNOSIS — J4520 Mild intermittent asthma, uncomplicated: Secondary | ICD-10-CM

## 2016-06-29 DIAGNOSIS — R053 Chronic cough: Secondary | ICD-10-CM

## 2016-06-29 MED ORDER — PREDNISONE 20 MG PO TABS: ORAL_TABLET | ORAL | 0 refills | Status: DC

## 2016-06-29 NOTE — Telephone Encounter (Signed)
Pt left v/m; pt was seen earlier today and pt thought rx was being sent to [facility] [address]. Per office note pt was to start prednisone.Please advise.

## 2016-06-29 NOTE — Assessment & Plan Note (Signed)
Likely post infectious bronchospasm in pt with hx of reactive airways. Treat with pred taper, allergy treatment. No sign of continue bacterial or viral infection.

## 2016-06-29 NOTE — Progress Notes (Signed)
   Subjective:    Patient ID: [name], female    DOB: 1950-10-13, 66 y.o.   MRN: [id number]  HPI  66 year old female With history of allergies and reactive airways seen by Dr. Lorelei Pont x 2 for cough, returns for continued cough.  Started to get sick in [place]. Seen at urgent care Treated with amox , then doxy 1/8:  Dx acute bronchitis, broadened antibiotics to levaquin.  Continue Singulair, albuterol prn, flonase  Seen again 1/15 with continue symptoms  CXR clear  Today she reports he cough continues,( clear mucus) hoarse voice  No ear pain, no face pain  Lots of post nasal drip.  No fever.  No SOB, wheeze now improved. No using albuterol recently.  Feels well.. Just cannot talk without coughing fits. Forgets to use flonase. She has been out of work since beginning of January. Talks on phone for job and cannot do this. Works part-time.  She tried to go back to work on Sat... Had coughing fits and cannot talk.  Review of Systems  Constitutional: Negative for fatigue and fever.  HENT: Negative for ear pain.   Eyes: Negative for pain.  Respiratory: Negative for chest tightness and shortness of breath.   Cardiovascular: Negative for chest pain, palpitations and leg swelling.  Gastrointestinal: Negative for abdominal pain.  Genitourinary: Negative for dysuria.       Objective:   Physical Exam  Constitutional: Vital signs are normal. She appears well-developed and well-nourished. She is cooperative.  Non-toxic appearance. She does not appear ill. No distress.  HENT:  Head: Normocephalic.  Right Ear: Hearing, tympanic membrane, external ear and ear canal normal. Tympanic membrane is not erythematous, not retracted and not bulging.  Left Ear: Hearing, tympanic membrane, external ear and ear canal normal. Tympanic membrane is not erythematous, not retracted and not bulging.  Nose: No mucosal edema or rhinorrhea. Right sinus exhibits no maxillary sinus tenderness and no  frontal sinus tenderness. Left sinus exhibits no maxillary sinus tenderness and no frontal sinus tenderness.  Mouth/Throat: Uvula is midline and mucous membranes are normal. Posterior oropharyngeal erythema present. No posterior oropharyngeal edema.  Post nasal drip  Eyes: Conjunctivae, EOM and lids are normal. Pupils are equal, round, and reactive to light. Lids are everted and swept, no foreign bodies found.  Neck: Trachea normal and normal range of motion. Neck supple. Carotid bruit is not present. No thyroid mass and no thyromegaly present.  Cardiovascular: Normal rate, regular rhythm, S1 normal, S2 normal, normal heart sounds, intact distal pulses and normal pulses.  Exam reveals no gallop and no friction rub.   No murmur heard. Pulmonary/Chest: Effort normal and breath sounds normal. No tachypnea. No respiratory distress. She has no decreased breath sounds. She has no wheezes. She has no rhonchi. She has no rales.  Hoarse voice and constant dry cough  Abdominal: Soft. Normal appearance and bowel sounds are normal. There is no tenderness.  Neurological: She is alert.  Skin: Skin is warm, dry and intact. No rash noted.  Psychiatric: Her speech is normal and behavior is normal. Judgment and thought content normal. Her mood appears not anxious. Cognition and memory are normal. She does not exhibit a depressed mood.          Assessment & Plan:

## 2016-06-29 NOTE — Telephone Encounter (Signed)
C[place]ell notified by telephone that her Rx for prednisone has been sent to [facility] and Dr. Diona Browner was sorry for the delay.

## 2016-06-29 NOTE — Telephone Encounter (Signed)
Let pt note rx has been sent. Sorry for the delay.

## 2016-06-29 NOTE — Progress Notes (Signed)
Pre visit review using our clinic review tool, if applicable. No additional management support is needed unless otherwise documented below in the visit note. 

## 2016-06-29 NOTE — Patient Instructions (Addendum)
Start prednisone course for persistent cough.  Albuterol as needed for coughing fits  Restart flonase when prednisone completed.  Continue allergy medications.

## 2016-07-01 ENCOUNTER — Telehealth: Payer: Self-pay | Admitting: Family Medicine

## 2016-07-01 NOTE — Telephone Encounter (Signed)
[employer] faxed paperwork needing additional information Notes from 06/24/16 and 06/21/16 faxed to [facility] and given to patients.  Please answer questions highlighted In dr bedsole's in box

## 2016-07-01 NOTE — Telephone Encounter (Signed)
Papers in outbox.

## 2016-07-05 ENCOUNTER — Telehealth: Payer: Self-pay

## 2016-07-05 ENCOUNTER — Ambulatory Visit (INDEPENDENT_AMBULATORY_CARE_PROVIDER_SITE_OTHER): Payer: BLUE CROSS/BLUE SHIELD | Admitting: Family Medicine

## 2016-07-05 ENCOUNTER — Encounter: Payer: Self-pay | Admitting: Family Medicine

## 2016-07-05 VITALS — BP 140/76 | HR 64 | Temp 98.5°F | Ht 64.25 in | Wt 183.5 lb

## 2016-07-05 DIAGNOSIS — R0982 Postnasal drip: Secondary | ICD-10-CM | POA: Diagnosis not present

## 2016-07-05 DIAGNOSIS — R05 Cough: Secondary | ICD-10-CM | POA: Diagnosis not present

## 2016-07-05 DIAGNOSIS — Z9109 Other allergy status, other than to drugs and biological substances: Secondary | ICD-10-CM | POA: Diagnosis not present

## 2016-07-05 DIAGNOSIS — R053 Chronic cough: Secondary | ICD-10-CM

## 2016-07-05 NOTE — Telephone Encounter (Signed)
PLEASE NOTE: All timestamps contained within this report are represented as [place] Standard Time. CONFIDENTIALTY NOTICE: This fax transmission is intended only for the addressee. It contains information that is legally privileged, confidential or otherwise protected from use or disclosure. If you are not the intended recipient, you are strictly prohibited from reviewing, disclosing, copying using or disseminating any of this information or taking any action in reliance on or regarding this information. If you have received this fax in error, please notify us immediately by telephone so that we can arrange for its return to [place]. Phone: [phone], Toll-Free: [phone], Fax: [phone] Page: 1 of 2 Call Id: [id number] [place] Patient Name: [name] Gender: Female DOB: 28-Jul-1950 Age: 66 Y 52 M 21 D Return Phone Number: [phone] (Primary), [phone] (Secondary) City/State/Zip: [place] [address] Client [facility] - Client Client Site [place] - Day Physician Eliezer Lofts - MD Who Is Calling Patient / Member / Family / Caregiver Call Type Triage / Clinical Relationship To Patient Self Return Phone Number [phone] (Primary) Chief Complaint Hoarseness (non urgent symptom) Reason for Call Symptomatic / Request for Health Information Initial Comment CBWN; missed call and is still needing nurse advice. Caller says that her voice gave out and throat is sore. Appointment Disposition EMR Appointment Not Necessary Info pasted into Epic No Nurse Assessment Nurse: Jimmye Norman, RN, Museum/gallery conservator Date/Time (Eastern Time): 07/05/2016 5:47:13 AM Confirm and document reason for call. If symptomatic, describe symptoms. ---Caller states that she has a sore throat and her voice is hoarse. It started in January. Does the PT have any chronic conditions?  (i.e. diabetes, asthma, etc.) ---Yes List chronic conditions. ---allergies Guidelines Guideline Title Affirmed Question Hoarseness Severe sore throat pain Disp. Time Eilene Ghazi Time) Disposition Final User 07/05/2016 5:53:43 AM See Physician within 24 Hours Yes Jimmye Norman, RN, Museum/gallery conservator Referrals REFERRED TO PCP OFFICE Care Advice Given Per Guideline SEE PHYSICIAN WITHIN 24 HOURS: * IF OFFICE WILL BE CLOSED AND NO PCP TRIAGE: You need to be seen within the next 24 hours. An urgent care center is often a good source of care if your doctor's office is closed. * IF OFFICE WILL BE OPEN: You need to be seen within the next 24 hours. Call your doctor when the office opens, and make an appointment. SORE THROAT - For relief of sore throat: * Sip warm chicken broth or apple juice. * Suck on hard candy or a throat lozenge (OTC). * Gargle with warm salt water four times a day. To make salt water, put 1/2 teaspoon of salt in 8 oz (240 ml) of warm water. * Avoid cigarette smoke. SOFT DIET: * Eat a soft diet. * Cold drinks, popsicles, and milk shakes are especially good. Avoid citrus fruits. DRINK PLENTY LIQUIDS: * Drink plenty of liquids. This is important to prevent dehydation. PAIN MEDICINES: * For pain relief, take acetaminophen, ibuprofen, or naproxen. * Use the lowest amount that makes your pain feel better. ACETAMINOPHEN (E.G., TYLENOL): * Take 650 mg (two 325 mg pills) by mouth every 4-6 hours as needed. Each Regular Strength Tylenol pill has 325 mg of acetaminophen. The most you should take each day is 3,250 mg (10 Regular Strength pills a day). * Another choice is to take 1,000 mg (two 500 mg pills) every 8 hours as needed. Each Extra Strength Tylenol pill has 500 mg of acetaminophen. The most PLEASE NOTE:  All timestamps contained within this report are represented as [place] Standard Time. CONFIDENTIALTY NOTICE: This fax transmission is intended only for the addressee. It contains information that is  legally privileged, confidential or otherwise protected from use or disclosure. If you are not the intended recipient, you are strictly prohibited from reviewing, disclosing, copying using or disseminating any of this information or taking any action in reliance on or regarding this information. If you have received this fax in error, please notify us immediately by telephone so that we can arrange for its return to [place]. Phone: [phone], Toll-Free: [phone], Fax: [phone] Page: 2 of 2 Call Id: [id number] Care Advice Given Per Guideline you should take each day is 3,000 mg (6 Extra Strength pills a day). * Before taking any medicine, read all the instructions on the package. CALL BACK IF: * Difficulty breathing or swallowing occurs * You become worse. CARE ADVICE given per Hoarseness (Adult) guideline.

## 2016-07-05 NOTE — Telephone Encounter (Signed)
Pt aware °Copy for pt °Copy for file °Copy for scan °

## 2016-07-05 NOTE — Assessment & Plan Note (Signed)
Resolving with steroid taper. Albuterol prn.

## 2016-07-05 NOTE — Assessment & Plan Note (Signed)
Start xyzal for allergic etiology. If not improving return to see allergist.

## 2016-07-05 NOTE — Telephone Encounter (Signed)
Pt has appt with Dr Diona Browner today at 07/05/16 at 10:45.

## 2016-07-05 NOTE — Progress Notes (Signed)
   Subjective:    Patient ID: [name], female    DOB: 25-May-1951, 66 y.o.   MRN: [id number]  Cough  Pertinent negatives include no chest pain, ear pain, fever or shortness of breath.  Headache   Pertinent negatives include no abdominal pain, ear pain, eye pain or fever.    66 year old female with reactive airways/ likely asthma cough variant presents with ,  sore throat, mucus in back of throat, nasal congestion with occ blood discharge from nose, slight headache.  Previous OV CXR clear on 1/15  At last OV 1/23 : treated with pred taper and albuterol given likely post infectious bronchospasm and allergy treatment with  Singulair and flonase.   Today she reports primarily being bothered by sore throat in evenings, post nasal drip, occ nasal discharge.  Cough is almost better after steroids. No fever, no face pain, no ear pain.  She has not been taking antihistamine.   Has not seen D.r Ishmael Holter allergist recently.    Review of Systems  Constitutional: Negative for fatigue and fever.  HENT: Negative for ear pain.   Eyes: Negative for pain.  Respiratory: Negative for chest tightness and shortness of breath.   Cardiovascular: Negative for chest pain, palpitations and leg swelling.  Gastrointestinal: Negative for abdominal pain.  Genitourinary: Negative for dysuria.       Objective:   Physical Exam  Constitutional: Vital signs are normal. She appears well-developed and well-nourished. She is cooperative.  Non-toxic appearance. She does not appear ill. No distress.  HENT:  Head: Normocephalic.  Right Ear: Hearing, tympanic membrane, external ear and ear canal normal. Tympanic membrane is not erythematous, not retracted and not bulging.  Left Ear: Hearing, tympanic membrane, external ear and ear canal normal. Tympanic membrane is not erythematous, not retracted and not bulging.  Nose: Mucosal edema and rhinorrhea present. Right sinus exhibits no maxillary sinus tenderness and no  frontal sinus tenderness. Left sinus exhibits no maxillary sinus tenderness and no frontal sinus tenderness.  Mouth/Throat: Uvula is midline, oropharynx is clear and moist and mucous membranes are normal.  Eyes: Conjunctivae, EOM and lids are normal. Pupils are equal, round, and reactive to light. Lids are everted and swept, no foreign bodies found.  Neck: Trachea normal and normal range of motion. Neck supple. Carotid bruit is not present. No thyroid mass and no thyromegaly present.  Cardiovascular: Normal rate, regular rhythm, S1 normal, S2 normal, normal heart sounds, intact distal pulses and normal pulses.  Exam reveals no gallop and no friction rub.   No murmur heard. Pulmonary/Chest: Effort normal and breath sounds normal. No tachypnea. No respiratory distress. She has no decreased breath sounds. She has no wheezes. She has no rhonchi. She has no rales.  Neurological: She is alert.  Skin: Skin is warm, dry and intact. No rash noted.  Psychiatric: Her speech is normal and behavior is normal. Judgment normal. Her mood appears not anxious. Cognition and memory are normal. She does not exhibit a depressed mood.          Assessment & Plan:

## 2016-07-05 NOTE — Telephone Encounter (Signed)
Paperwork faxed °

## 2016-07-05 NOTE — Telephone Encounter (Signed)
PLEASE NOTE: All timestamps contained within this report are represented as [place] Standard Time. CONFIDENTIALTY NOTICE: This fax transmission is intended only for the addressee. It contains information that is legally privileged, confidential or otherwise protected from use or disclosure. If you are not the intended recipient, you are strictly prohibited from reviewing, disclosing, copying using or disseminating any of this information or taking any action in reliance on or regarding this information. If you have received this fax in error, please notify us immediately by telephone so that we can arrange for its return to [place]. Phone: [phone], Toll-Free: [phone], Fax: [phone] Page: 1 of 1 Call Id: [id number] [place] Patient Name: [name] Gender: Female DOB: November 19, 1950 Age: 66 Y 8 M 19 D Return Phone Number: [phone] (Primary), [phone] (Secondary) City/State/Zip: [place] Client [place] [facility] - Client Client Site [place] Physician Eliezer Lofts - MD Who Is Calling Patient / Member / Family / Caregiver Call Type Triage / Clinical Relationship To Patient Self Return Phone Number [phone] (Primary) Chief Complaint Cough Reason for Call Symptomatic / Request for [place] states has been sick since Jan. 8th...congestion, coughing, sore throat and hoarseness, planning on going back to work but the last three nights symptoms are bad. Nurse Assessment Guidelines Guideline Title Affirmed Question Disp. Time Eilene Ghazi Time) Disposition Final User 07/03/2016 8:36:04 AM FINAL ATTEMPT MADE - message left Yes Markus Daft, RN, Sherre Poot

## 2016-07-05 NOTE — Progress Notes (Signed)
Pre visit review using our clinic review tool, if applicable. No additional management support is needed unless otherwise documented below in the visit note. 

## 2016-07-05 NOTE — Patient Instructions (Addendum)
Add Xyzal to regimen for allergy control at bedtime.  Complete prednisone taper.  If still not improving follow up with allergist for further evaluation.

## 2016-07-06 ENCOUNTER — Telehealth: Payer: Self-pay

## 2016-07-06 NOTE — Telephone Encounter (Signed)
Left message for [name] that it is okay to return to work as of yesterdays appointment per Dr. Diona Browner.

## 2016-07-06 NOTE — Telephone Encounter (Signed)
Okay to return to work as of yesterday's appt.

## 2016-07-06 NOTE — Telephone Encounter (Signed)
Pt left v/m; pt was seen 07/05/16 by Dr Bedsole and pt forgot to ask when she can return to work; pt has short term disability thru [employer]. Pt request cb. 

## 2016-07-06 NOTE — Telephone Encounter (Signed)
Spoke with Ms. Caryl Pina.   She only works part time and was scheduled to work 1/2 day today but did not get my message so she missed her shift today.  Not scheduled to work again until Saturday 07/10/16.  Wants to make sure on her [place] paperwork that we are willing to put Wednesdays date of 07/07/2016 as return to work so she doesn't get dinged for missing today.  Advised that we will put her return to work date at 07/07/2016.

## 2016-07-26 ENCOUNTER — Encounter: Payer: Self-pay | Admitting: Gynecology

## 2016-07-26 ENCOUNTER — Ambulatory Visit: Payer: BLUE CROSS/BLUE SHIELD | Attending: Gynecology | Admitting: Gynecology

## 2016-07-26 VITALS — BP 140/83 | HR 55 | Temp 98.4°F | Resp 18 | Ht 64.25 in | Wt 183.7 lb

## 2016-07-26 DIAGNOSIS — Z87891 Personal history of nicotine dependence: Secondary | ICD-10-CM | POA: Insufficient documentation

## 2016-07-26 DIAGNOSIS — I10 Essential (primary) hypertension: Secondary | ICD-10-CM | POA: Insufficient documentation

## 2016-07-26 DIAGNOSIS — Z86010 Personal history of colonic polyps: Secondary | ICD-10-CM | POA: Insufficient documentation

## 2016-07-26 DIAGNOSIS — Z8544 Personal history of malignant neoplasm of other female genital organs: Secondary | ICD-10-CM | POA: Diagnosis not present

## 2016-07-26 DIAGNOSIS — K219 Gastro-esophageal reflux disease without esophagitis: Secondary | ICD-10-CM | POA: Insufficient documentation

## 2016-07-26 DIAGNOSIS — C4499 Other specified malignant neoplasm of skin, unspecified: Secondary | ICD-10-CM

## 2016-07-26 DIAGNOSIS — C519 Malignant neoplasm of vulva, unspecified: Secondary | ICD-10-CM | POA: Diagnosis not present

## 2016-07-26 DIAGNOSIS — Z9889 Other specified postprocedural states: Secondary | ICD-10-CM | POA: Diagnosis not present

## 2016-07-26 NOTE — Patient Instructions (Signed)
Return to see me in 3 months. 

## 2016-07-26 NOTE — Progress Notes (Signed)
Consult Note: Gyn-Onc   [name] 66 y.o. female  Chief Complaint  Patient presents with  . Paget's disease of Vulva    Assessment : Paget's disease of the vulva  . No evidence recurrent disease. Plan:    Patient return in 3 months.  Interval History:    Patient  returns today as previously scheduled. Since her last visit she's done well. Specifically she denies any vulvar symptoms.  It's recall that her last visit we obtained a vulvar biopsy of an area that was symptomatic. The biopsy returned showing no evidence of Paget's disease.  HPI:The patient initially underwent surgery for Paget's disease of the vulva in October 2011. There was focal involvement of the medial margin. The entire lesion is approximately 5 cm in diameter.   The patient previously had a supracervical hysterectomy but has her ovaries and cervix in situ.  She notes that she had some repair done at the time of her hysterectomy and at her introitus is tight and she has difficulty having intercourse and also has tearing. On the other hand her husband has had prostate cancer surgery making intercourse difficult as well.  Patient underwent wide local excision of a recurrent lesion on August 50,016 while all gross lesion was removed she did have positive surgical margins that were focal. Subsequently she underwent a another excision on 04/10/2015 with negative margins.     Review of Systems:10 point review of systems is negative except as noted in interval history.   Vitals: Blood pressure 140/83, pulse (!) 55, temperature 98.4 F (36.9 C), temperature source Oral, resp. rate 18, height 5' 4.25" (1.632 m), weight 183 lb 11.2 oz (83.3 kg), SpO2 100 %.  Physical Exam: General : The patient is a healthy woman in no acute distress.  HEENT: normocephalic, extraoccular movements normal; neck is supple without thyromegally  Lynphnodes: Supraclavicular and inguinal nodes not enlarged  Abdomen: Soft, non-tender, no  ascites, no organomegally, no masses, no hernias  Pelvic:  EGBUS: Normal female her as a defect in the right vulva consistent with prior excision.  No lesions are noted.    Lower extremities: No edema or varicosities. Normal range of motion   Allergies  Allergen Reactions  . Ibuprofen Hypertension  . Naprosyn [Naproxen] Nausea Only  . Other     Allergic to toothpaste- makes mouth peel Hay fever/dust mites/trees "365 allergic"   . Azithromycin Itching and Rash  . Sulfa Antibiotics Hives and Rash    Past Medical History:  Diagnosis Date  . Allergic rhinitis   . Arthritis   . GERD (gastroesophageal reflux disease)   . Headache(784.0)    Migraines  . History of adenomatous polyp of colon   . History of concussion    AS CHILD--  NO RESIDUAL  . History of palpitations   . Hypertension   . Paget's disease of vulva   . PONV (postoperative nausea and vomiting)    SEVERE  . Wears glasses     Past Surgical History:  Procedure Laterality Date  . mucoid cyst removal thumb    . 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 PULLEY RELEASE RIGHT THUMB  03-23-2011  . SIMPLE VULVECTOMY  03-24-2010   right side  . TONSILLECTOMY  1977  . TRIGGER FINGER RELEASE    . VAGINAL HYSTERECTOMY  1989   and Anterior and posterior repair's for prolapse  . VULVECTOMY Right 01/10/2015  Procedure: RIGHT WIDE LOCAL EXCISION VULVECTOMY;  Surgeon: [name], MD;  [facility];  Service: Gynecology;  Laterality: Right;  Marland Kitchen VULVECTOMY Right 04/10/2015   Procedure: WIDE LOCAL EXCISION VULVA;  Surgeon: [name], MD;  Location[facility];  Service: Gynecology;  Laterality: Right;  . WISDOM TOOTH EXTRACTION      Current Outpatient Prescriptions  Medication Sig Dispense Refill  . albuterol (PROVENTIL HFA;VENTOLIN HFA) 108 (90 Base) MCG/ACT inhaler Inhale 2 puffs into  the lungs every 6 (six) hours as needed. Reported on 10/24/2015 18 g 0  . doxylamine, Sleep, (UNISOM) 25 MG tablet Take 25 mg by mouth at bedtime as needed.    . fluticasone (FLONASE) 50 MCG/ACT nasal spray Place 2 sprays into the nose daily as needed.     . lansoprazole (PREVACID) 30 MG capsule Take 30 mg by mouth 2 (two) times daily before a meal.     . losartan-hydrochlorothiazide (HYZAAR) 100-25 MG tablet TAKE 1 TABLET DAILY IN THE MORNING 90 tablet 1  . metoprolol tartrate (LOPRESSOR) 25 MG tablet Take one-fourth ( 6.25 mg) tablet by mouth two times a day. 45 tablet 1  . montelukast (SINGULAIR) 10 MG tablet TAKE 1 TABLET EVERY MORNING 90 tablet 3  . Triamcinolone & Emollient (DERMASORB TA) 0.1 % KIT   0  . triamcinolone ointment (KENALOG) 0.5 % Apply 1 application topically 2 (two) times daily. (Patient not taking: Reported on 07/26/2016) 30 g 1   No current facility-administered medications for this visit.     Social History   Social History  . Marital status: Married    Spouse name: N/A  . Number of children: N/A  . Years of education: N/A   Occupational History  . Not on file.   Social History Main Topics  . Smoking status: Former Smoker    Years: 5.00    Types: Cigarettes    Quit date: 04/15/1989  . Smokeless tobacco: Never Used  . Alcohol use 0.6 oz/week    1 Glasses of wine per week     Comment: occasional wine  . Drug use: No  . Sexual activity: Not on file   Other Topics Concern  . Not on file   Social History Narrative  . No narrative on file    Family History  Problem Relation Age of Onset  . Cancer Maternal Grandmother   . Hypertension Father   . Heart disease Father   . Hypertension Mother   . Heart disease Mother   . Breast cancer Paternal Grandmother 34  . Colon cancer Paternal Uncle       [name], MD 07/26/2016, 1:40 PM

## 2016-09-17 ENCOUNTER — Ambulatory Visit (INDEPENDENT_AMBULATORY_CARE_PROVIDER_SITE_OTHER): Payer: BLUE CROSS/BLUE SHIELD | Admitting: Allergy

## 2016-09-17 ENCOUNTER — Encounter: Payer: Self-pay | Admitting: Allergy

## 2016-09-17 VITALS — BP 116/72 | HR 72 | Temp 98.2°F | Resp 18 | Ht 63.5 in | Wt 185.0 lb

## 2016-09-17 DIAGNOSIS — K219 Gastro-esophageal reflux disease without esophagitis: Secondary | ICD-10-CM | POA: Diagnosis not present

## 2016-09-17 DIAGNOSIS — J4520 Mild intermittent asthma, uncomplicated: Secondary | ICD-10-CM | POA: Diagnosis not present

## 2016-09-17 DIAGNOSIS — J3089 Other allergic rhinitis: Secondary | ICD-10-CM

## 2016-09-17 DIAGNOSIS — T781XXA Other adverse food reactions, not elsewhere classified, initial encounter: Secondary | ICD-10-CM

## 2016-09-17 MED ORDER — ALBUTEROL SULFATE HFA 108 (90 BASE) MCG/ACT IN AERS: 2.0000 | INHALATION_SPRAY | Freq: Four times a day (QID) | RESPIRATORY_TRACT | 0 refills | Status: DC | PRN

## 2016-09-17 MED ORDER — AZELASTINE HCL 0.1 % NA SOLN: 1.0000 | Freq: Two times a day (BID) | NASAL | 1 refills | Status: DC

## 2016-09-17 MED ORDER — AZELASTINE HCL 0.1 % NA SOLN: 1.0000 | Freq: Two times a day (BID) | NASAL | 0 refills | Status: DC

## 2016-09-17 NOTE — Patient Instructions (Signed)
Start nasal antihistamine Astelin 2 sprays twice a day.   May use Astelin together with Flonase 2 sprays each nostril daily especially if you have components of nasal stuffy/congested and runny/drainage   Continue Xyzal 5mg  daily  Continue Singulair 10mg  daily -- take at bedtime  Use albuterol inhaler 2 puffs every 4-6 hours as needed for cough, wheeze, difficulty breathing, chest tightness.  Monitor frequency of albuterol use.    Continue Prevacid daily for reflux control  Look for sulfite-free wine   Follow-up 4 months or sooner if needed

## 2016-09-17 NOTE — Progress Notes (Signed)
Follow-up Note  RE: [name] MRN: [id number] DOB: 14-Apr-1951 Date of Office Visit: 09/17/2016   History of present illness: [name] is a 66 y.o. female presenting today for follow-up of allergies and sinus disease as well as history of asthma.  She was last seen in the office in 12/14/2013 by Dr. Ishmael Holter.  She is a singer in a praise band and she's been having issues with her voice.   She reports voice has been cracking and she starts coughing.  She reports at church there is a lot of dust and unsure if dust exposure is causing her symptoms.  She does report she coughs at other times in other locations especially at home in the mornings.  She denies any chest tightness, wheeze or difficulty breathing.   She has had bronchitis within the past 46mo  She reports use to have albuterol but has expired and does not remember the last time she has used.    She takes Singulair as well as Xyzal but does not take every night.   She did start back up on Flonase 2 spays daily each side.  She feels like something is in the back of her throat that she can feel when she swallows.  She does report reflux and reports she will have heartburn if she miss taking her prevacid.    She reports with wine ingestion she does cough and reports will have more congestion in the morning following ingestion.    She has seen ENT previously for issues with hoarseness. She had allergy testing done in our office in 2012 that showed activity to grasses, ragweed and sheep sorrell, trees, minor mold mix 3, cockroach and dust mite    Review of systems: Review of Systems  Constitutional: Negative for chills, fever and malaise/fatigue.  HENT: Positive for congestion and sore throat. Negative for ear discharge, ear pain, nosebleeds, sinus pain and tinnitus.   Eyes: Negative for discharge and redness.  Respiratory: Positive for cough. Negative for sputum production, shortness of breath, wheezing and stridor.     Cardiovascular: Negative for chest pain.  Gastrointestinal: Positive for heartburn. Negative for abdominal pain, diarrhea, nausea and vomiting.  Musculoskeletal: Negative for joint pain and myalgias.  Skin: Negative for itching and rash.  Neurological: Negative for headaches.    All other systems negative unless noted above in HPI  Past medical/social/surgical/family history have been reviewed and are unchanged unless specifically indicated below.  No changes  Medication List: Allergies as of 09/17/2016      Reactions   Ibuprofen Hypertension   Naprosyn [naproxen] Nausea Only   Other    Allergic to toothpaste- makes mouth peel Hay fever/dust mites/trees "365 allergic"    Azithromycin Itching, Rash   Sulfa Antibiotics Hives, Rash      Medication List       Accurate as of 09/17/16  5:24 PM. Always use your most recent med list.          albuterol 108 (90 Base) MCG/ACT inhaler Commonly known as:  PROVENTIL HFA;VENTOLIN HFA Inhale 2 puffs into the lungs every 6 (six) hours as needed. Reported on 10/24/2015   azelastine 0.1 % nasal spray Commonly known as:  ASTELIN Place 1 spray into both nostrils 2 (two) times daily. Use in each nostril as directed   DERMASORB TA 0.1 % Kit   fluticasone 50 MCG/ACT nasal spray Commonly known as:  FLONASE Place 2 sprays into the nose daily as needed.   lansoprazole  30 MG capsule Commonly known as:  PREVACID Take 30 mg by mouth 2 (two) times daily before a meal.   losartan-hydrochlorothiazide 100-25 MG tablet Commonly known as:  HYZAAR TAKE 1 TABLET DAILY IN THE MORNING   metoprolol tartrate 25 MG tablet Commonly known as:  LOPRESSOR Take one-fourth ( 6.25 mg) tablet by mouth two times a day.   montelukast 10 MG tablet Commonly known as:  SINGULAIR TAKE 1 TABLET EVERY MORNING   triamcinolone ointment 0.5 % Commonly known as:  KENALOG Apply 1 application topically 2 (two) times daily.   UNISOM 25 MG tablet Generic drug:   doxylamine (Sleep) Take 25 mg by mouth at bedtime as needed.   XYZAL 5 MG tablet Generic drug:  levocetirizine Take 5 mg by mouth every evening.       Known medication allergies: Allergies  Allergen Reactions  . Ibuprofen Hypertension  . Naprosyn [Naproxen] Nausea Only  . Other     Allergic to toothpaste- makes mouth peel Hay fever/dust mites/trees "365 allergic"   . Azithromycin Itching and Rash  . Sulfa Antibiotics Hives and Rash     Physical examination: Blood pressure 116/72, pulse 72, temperature 98.2 F (36.8 C), temperature source Oral, resp. rate 18, height 5' 3.5" (1.613 m), weight 185 lb (83.9 kg), SpO2 97 %.  General: Alert, interactive, in no acute distress. HEENT: PERRLA, TMs pearly gray, turbinates mildly edematous with clear discharge, post-pharynx non erythematous. Posterior oropharynx appears normal without erythema or exudates. Uvula is midline tonsillar palate appears symmetric Neck: Supple without lymphadenopathy. Lungs: Clear to auscultation without wheezing, rhonchi or rales. {no increased work of breathing. CV: Normal S1, S2 without murmurs. Abdomen: Nondistended, nontender. Skin: Warm and dry, without lesions or rashes. Extremities:  No clubbing, cyanosis or edema. Neuro:   Grossly intact.   Diagnositics/Labs:   Spirometry: FEV1: 1.99L  84%, FVC: 2.9L  98%, ratio consistent with Nonobstructive pattern  Assessment and plan:    Allergic rhinoconjunctivitis   - She most likely has a component of postnasal drainage that is irritating the back of the throat is irritating to the vocal cords leading to cough   - Start nasal antihistamine Astelin 2 sprays twice a day.      May use Astelin together with Flonase 2 sprays each nostril daily especially if you have components of nasal stuffy/congested and runny/drainage    - Continue Xyzal 22m daily   - Continue Singulair 127mdaily -- take at bedtime  Cough, most likely mild intermittent asthma versus  VCD   - Use albuterol inhaler 2 puffs every 4-6 hours as needed for cough, wheeze, difficulty breathing, chest tightness.  Monitor frequency of albuterol use.     - Singulair as above   - If she does not be below asthma goals will consider resuming an ICS Asthma control goals:   Full participation in all desired activities (may need albuterol before activity)  Albuterol use two time or less a week on average (not counting use with activity)  Cough interfering with sleep two time or less a month  Oral steroids no more than once a year  No hospitalizations  GERD   - Continue Prevacid daily for reflux control  Adverse food reaction   - She may be having an issue with sulfite thus advised that she look for sulfite-free wine to determine if she is reacting to that or not   Follow-up 4 months or sooner if needed  I appreciate the opportunity to take part in  Paullette's care. Please do not hesitate to contact me with questions.  Sincerely,   Prudy Feeler, MD Allergy/Immunology [facility]

## 2016-09-20 ENCOUNTER — Encounter: Payer: Self-pay | Admitting: Primary Care

## 2016-09-20 ENCOUNTER — Ambulatory Visit (INDEPENDENT_AMBULATORY_CARE_PROVIDER_SITE_OTHER): Payer: BLUE CROSS/BLUE SHIELD | Admitting: Primary Care

## 2016-09-20 VITALS — BP 126/80 | HR 65 | Temp 98.2°F | Ht 64.0 in | Wt 184.8 lb

## 2016-09-20 DIAGNOSIS — R0989 Other specified symptoms and signs involving the circulatory and respiratory systems: Secondary | ICD-10-CM

## 2016-09-20 NOTE — Progress Notes (Signed)
Subjective:    Patient ID: [name], female    DOB: 1951-01-26, 66 y.o.   MRN: [id number]  HPI  [name] is a 66 year old female with a history of GERD, reactive airway disease who presents today with a chief complaint of throat discomfort. The sensation is noted to the right posterior pharynx. The sensation first began 2 weeks ago. She feels this sensation each times she swallows. She denies shortness of breath, throat tightness, wheezing, bad breath, difficulty eating. She doesn't remember eating anything strange prior to the sensation. She's tried gargling with salt water without improvement. She is managed on Prevacid, Flonase, and Astelin.  Review of Systems  Constitutional: Negative for fever.  HENT: Negative for sore throat and trouble swallowing.   Respiratory: Negative for cough, shortness of breath, wheezing and stridor.   Gastrointestinal:       Denies esophageal burning  Allergic/Immunologic: Positive for environmental allergies.       Past Medical History:  Diagnosis Date  . Allergic rhinitis   . Arthritis   . GERD (gastroesophageal reflux disease)   . Headache(784.0)    Migraines  . History of adenomatous polyp of colon   . History of concussion    AS CHILD--  NO RESIDUAL  . History of palpitations   . Hypertension   . Paget's disease of vulva   . PONV (postoperative nausea and vomiting)    SEVERE  . Wears glasses      Social History   Social History  . Marital status: Married    Spouse name: N/A  . Number of children: N/A  . Years of education: N/A   Occupational History  . Not on file.   Social History Main Topics  . Smoking status: Former Smoker    Years: 5.00    Types: Cigarettes    Quit date: 04/15/1989  . Smokeless tobacco: Never Used  . Alcohol use 0.6 oz/week    1 Glasses of wine per week     Comment: occasional wine  . Drug use: No  . Sexual activity: Not on file   Other Topics Concern  . Not on file   Social History  Narrative  . No narrative on file    Past Surgical History:  Procedure Laterality Date  . mucoid cyst removal thumb    . 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 PULLEY RELEASE RIGHT THUMB  03-23-2011  . SIMPLE VULVECTOMY  03-24-2010   right side  . TONSILLECTOMY  1977  . TRIGGER FINGER RELEASE    . VAGINAL HYSTERECTOMY  1989   and Anterior and posterior repair's for prolapse  . VULVECTOMY Right 01/10/2015   Procedure: RIGHT WIDE LOCAL EXCISION VULVECTOMY;  Surgeon: [name], MD;  Location: [facility];  Service: Gynecology;  Laterality: Right;  Marland Kitchen VULVECTOMY Right 04/10/2015   Procedure: WIDE LOCAL EXCISION VULVA;  Surgeon: [name], MD;  Location: [facility];  Service: Gynecology;  Laterality: Right;  . WISDOM TOOTH EXTRACTION      Family History  Problem Relation Age of Onset  . Cancer Maternal Grandmother   . Hypertension Father   . Heart disease Father   . Asthma Father   . Hypertension Mother   . Heart disease Mother   . Breast cancer Paternal Grandmother 36  . Colon cancer Paternal Uncle   . Allergic rhinitis Neg Hx   .  Angioedema Neg Hx   . Eczema Neg Hx   . Immunodeficiency Neg Hx   . Urticaria Neg Hx     Allergies  Allergen Reactions  . Ibuprofen Hypertension  . Naprosyn [Naproxen] Nausea Only  . Other     Allergic to toothpaste- makes mouth peel Hay fever/dust mites/trees "365 allergic"   . Azithromycin Itching and Rash  . Sulfa Antibiotics Hives and Rash    Current Outpatient Prescriptions on File Prior to Visit  Medication Sig Dispense Refill  . albuterol (PROVENTIL HFA;VENTOLIN HFA) 108 (90 Base) MCG/ACT inhaler Inhale 2 puffs into the lungs every 6 (six) hours as needed. Reported on 10/24/2015 1 Inhaler 0  . azelastine (ASTELIN) 0.1 % nasal spray Place 1 spray into both nostrils 2 (two) times daily. Use in each  nostril as directed 30 mL 0  . doxylamine, Sleep, (UNISOM) 25 MG tablet Take 25 mg by mouth at bedtime as needed.    . fluticasone (FLONASE) 50 MCG/ACT nasal spray Place 2 sprays into the nose daily as needed.     . lansoprazole (PREVACID) 30 MG capsule Take 30 mg by mouth 2 (two) times daily before a meal.     . levocetirizine (XYZAL) 5 MG tablet Take 5 mg by mouth every evening.    Marland Kitchen losartan-hydrochlorothiazide (HYZAAR) 100-25 MG tablet TAKE 1 TABLET DAILY IN THE MORNING 90 tablet 1  . metoprolol tartrate (LOPRESSOR) 25 MG tablet Take one-fourth ( 6.25 mg) tablet by mouth two times a day. 45 tablet 1  . montelukast (SINGULAIR) 10 MG tablet TAKE 1 TABLET EVERY MORNING 90 tablet 3  . Triamcinolone & Emollient (DERMASORB TA) 0.1 % KIT   0  . triamcinolone ointment (KENALOG) 0.5 % Apply 1 application topically 2 (two) times daily. 30 g 1   No current facility-administered medications on file prior to visit.     BP 126/80   Pulse 65   Temp 98.2 F (36.8 C) (Oral)   Ht 5' 4"  (1.626 m)   Wt 184 lb 12.8 oz (83.8 kg)   SpO2 98%   BMI 31.72 kg/m    Objective:   Physical Exam  Constitutional: She appears well-nourished.  HENT:  Mouth/Throat: Uvula is midline. No oropharyngeal exudate, posterior oropharyngeal edema or posterior oropharyngeal erythema.  Neck: Neck supple. No thyromegaly present.  Cardiovascular: Normal rate and regular rhythm.   Pulmonary/Chest: Effort normal and breath sounds normal. She has no wheezes.          Assessment & Plan:  Throat Sensation:  Sensation of foreign object to right posterior pharynx x 2 weeks. No SOB, throat tightness, wheezing, pain with swallowing, acid reflux symptoms. Exam today unremarkable. No evidence of bacterial/viral involvement, no foreign body, tonsils surgically absent. Will have her start Flonase daily, continue Prevacid, try gargling with mouthwash rather than salt water. She will call her ENT if no improvement after those  interventions.  Sheral Flow, NP

## 2016-09-20 NOTE — Progress Notes (Signed)
Pre visit review using our clinic review tool, if applicable. No additional management support is needed unless otherwise documented below in the visit note. 

## 2016-09-20 NOTE — Patient Instructions (Addendum)
Start using your Flonase daily.  Try gargling with mouthwash rather than salt water.  If no improvement then I recommend seeing an ENT specialist.   It was a pleasure meeting you!

## 2016-09-24 ENCOUNTER — Telehealth: Payer: Self-pay | Admitting: *Deleted

## 2016-09-24 NOTE — Telephone Encounter (Signed)
Patient called and moved her appt form May 18th to June 1st. Patient aware of date/time

## 2016-09-28 ENCOUNTER — Encounter: Payer: Self-pay | Admitting: Internal Medicine

## 2016-09-28 ENCOUNTER — Ambulatory Visit (INDEPENDENT_AMBULATORY_CARE_PROVIDER_SITE_OTHER): Payer: BLUE CROSS/BLUE SHIELD | Admitting: Internal Medicine

## 2016-09-28 VITALS — BP 130/80 | HR 70 | Temp 98.1°F | Wt 185.0 lb

## 2016-09-28 DIAGNOSIS — J029 Acute pharyngitis, unspecified: Secondary | ICD-10-CM

## 2016-09-28 DIAGNOSIS — R0982 Postnasal drip: Secondary | ICD-10-CM

## 2016-09-28 NOTE — Patient Instructions (Signed)
Sore Throat When you have a sore throat, your throat may:  Hurt.  Burn.  Feel irritated.  Feel scratchy. Many things can cause a sore throat, including:  An infection.  Allergies.  Dryness in the air.  Smoke or pollution.  Gastroesophageal reflux disease (GERD).  A tumor. A sore throat can be the first sign of another sickness. It can happen with other problems, like coughing or a fever. Most sore throats go away without treatment. Follow these instructions at home:  Take over-the-counter medicines only as told by your doctor.  Drink enough fluids to keep your pee (urine) clear or pale yellow.  Rest when you feel you need to.  To help with pain, try:  Sipping warm liquids, such as broth, herbal tea, or warm water.  Eating or drinking cold or frozen liquids, such as frozen ice pops.  Gargling with a salt-water mixture 3-4 times a day or as needed. To make a salt-water mixture, add -1 tsp of salt in 1 cup of warm water. Mix it until you cannot see the salt anymore.  Sucking on hard candy or throat lozenges.  Putting a cool-mist humidifier in your bedroom at night.  Sitting in the bathroom with the door closed for 5-10 minutes while you run hot water in the shower.  Do not use any tobacco products, such as cigarettes, chewing tobacco, and e-cigarettes. If you need help quitting, ask your doctor. Contact a doctor if:  You have a fever for more than 2-3 days.  You keep having symptoms for more than 2-3 days.  Your throat does not get better in 7 days.  You have a fever and your symptoms suddenly get worse. Get help right away if:  You have trouble breathing.  You cannot swallow fluids, soft foods, or your saliva.  You have swelling in your throat or neck that gets worse.  You keep feeling like you are going to throw up (vomit).  You keep throwing up. This information is not intended to replace advice given to you by your health care provider. Make sure  you discuss any questions you have with your health care provider. Document Released: 03/02/2008 Document Revised: 01/18/2016 Document Reviewed: 03/14/2015 Elsevier Interactive Patient Education  2017 [employer]vier Inc[employer]  

## 2016-09-28 NOTE — Progress Notes (Signed)
Subjective:    Patient ID: [name], female    DOB: [birth year], 66 y.o.   MRN: [id number]  HPI  Pt presents to the clinic today with c/o throat discomfort. This started 3 weeks ago. She was seen 4/16 for the same, by Allie Bossier, NP-C. She was advised to use Astelin nasal spray, Singulair and continue Prevacid. Since that time, the throat discomfort moved from the right side of her throat to the left. She describes the pain as raw. She does have some difficulty swallowing. She denies getting choked on her food. She denies fever, chills or body aches. She has not tried anything additional OTC. She has seen her allergies for the same, and was told they did not see anything in her throat either.  Review of Systems      Past Medical History:  Diagnosis Date  . Allergic rhinitis   . Arthritis   . GERD (gastroesophageal reflux disease)   . Headache(784.0)    Migraines  . History of adenomatous polyp of colon   . History of concussion    AS CHILD--  NO RESIDUAL  . History of palpitations   . Hypertension   . Paget's disease of vulva   . PONV (postoperative nausea and vomiting)    SEVERE  . Wears glasses     Current Outpatient Prescriptions  Medication Sig Dispense Refill  . albuterol (PROVENTIL HFA;VENTOLIN HFA) 108 (90 Base) MCG/ACT inhaler Inhale 2 puffs into the lungs every 6 (six) hours as needed. Reported on 10/24/2015 1 Inhaler 0  . azelastine (ASTELIN) 0.1 % nasal spray Place 1 spray into both nostrils 2 (two) times daily. Use in each nostril as directed 30 mL 0  . doxylamine, Sleep, (UNISOM) 25 MG tablet Take 25 mg by mouth at bedtime as needed.    . fluticasone (FLONASE) 50 MCG/ACT nasal spray Place 2 sprays into the nose daily as needed.     . lansoprazole (PREVACID) 30 MG capsule Take 30 mg by mouth 2 (two) times daily before a meal.     . levocetirizine (XYZAL) 5 MG tablet Take 5 mg by mouth every evening.    Marland Kitchen losartan-hydrochlorothiazide (HYZAAR) 100-25 MG tablet  TAKE 1 TABLET DAILY IN THE MORNING 90 tablet 1  . metoprolol tartrate (LOPRESSOR) 25 MG tablet Take one-fourth ( 6.25 mg) tablet by mouth two times a day. 45 tablet 1  . montelukast (SINGULAIR) 10 MG tablet TAKE 1 TABLET EVERY MORNING 90 tablet 3  . Triamcinolone & Emollient (DERMASORB TA) 0.1 % KIT   0  . triamcinolone ointment (KENALOG) 0.5 % Apply 1 application topically 2 (two) times daily. 30 g 1   No current facility-administered medications for this visit.     Allergies  Allergen Reactions  . Ibuprofen Hypertension  . Naprosyn [Naproxen] Nausea Only  . Other     Allergic to toothpaste- makes mouth peel Hay fever/dust mites/trees "365 allergic"   . Azithromycin Itching and Rash  . Sulfa Antibiotics Hives and Rash    Family History  Problem Relation Age of Onset  . Cancer Maternal Grandmother   . Hypertension Father   . Heart disease Father   . Asthma Father   . Hypertension Mother   . Heart disease Mother   . Breast cancer Paternal Grandmother 56  . Colon cancer Paternal Uncle   . Allergic rhinitis Neg Hx   . Angioedema Neg Hx   . Eczema Neg Hx   . Immunodeficiency Neg Hx   .  Urticaria Neg Hx     Social History   Social History  . Marital status: Married    Spouse name: N/A  . Number of children: N/A  . Years of education: N/A   Occupational History  . Not on file.   Social History Main Topics  . Smoking status: Former Smoker    Years: 5.00    Types: Cigarettes    Quit date: 04/15/1989  . Smokeless tobacco: Never Used  . Alcohol use 0.6 oz/week    1 Glasses of wine per week     Comment: occasional wine  . Drug use: No  . Sexual activity: Not on file   Other Topics Concern  . Not on file   Social History Narrative  . No narrative on file     Constitutional: Denies fever, malaise, fatigue, headache or abrupt weight changes.  HEENT: Pt reports sore throat. Denies eye pain, eye redness, ear pain, ringing in the ears, wax buildup, runny nose, nasal  congestion, bloody nose. Respiratory: Denies difficulty breathing, shortness of breath, cough or sputum production.   Cardiovascular: Denies chest pain, chest tightness, palpitations or swelling in the hands or feet.   No other specific complaints in a complete review of systems (except as listed in HPI above).  Objective:   Physical Exam  BP 130/80   Pulse 70   Temp 98.1 F (36.7 C) (Oral)   Wt 185 lb (83.9 kg)   SpO2 97%   BMI 31.76 kg/m  Wt Readings from Last 3 Encounters:  09/28/16 185 lb (83.9 kg)  09/20/16 184 lb 12.8 oz (83.8 kg)  09/17/16 185 lb (83.9 kg)    General: Appears her stated age, in NAD. HEENT:  Throat/Mouth: Teeth present, mucosa pink and moist, + PND, no exudate, lesions or ulcerations noted.  Neck:  No adenopathy noted.  Pulmonary/Chest: Normal effort and positive vesicular breath sounds. No respiratory distress. No wheezes, rales or ronchi noted.   BMET    Component Value Date/Time   NA 141 04/09/2016 0936   NA 143 08/10/2011 1133   K 4.1 04/09/2016 0936   K 4.4 08/10/2011 1133   CL 103 04/09/2016 0936   CL 107 08/10/2011 1133   CO2 30 04/09/2016 0936   CO2 26 08/10/2011 1133   GLUCOSE 90 04/09/2016 0936   GLUCOSE 90 08/10/2011 1133   BUN 12 04/09/2016 0936   BUN 10 08/10/2011 1133   CREATININE 0.92 04/09/2016 0936   CREATININE 0.84 08/10/2011 1133   CREATININE 0.88 12/30/2010 1048   CALCIUM 9.6 04/09/2016 0936   CALCIUM 8.9 08/10/2011 1133   GFRNONAA 76 (L) 08/14/2011 2257   GFRNONAA >60 08/10/2011 1133   GFRAA 88 (L) 08/14/2011 2257   GFRAA >60 08/10/2011 1133    Lipid Panel     Component Value Date/Time   CHOL 245 (H) 04/09/2016 0936   TRIG 80.0 04/09/2016 0936   HDL 94.40 04/09/2016 0936   CHOLHDL 3 04/09/2016 0936   VLDL 16.0 04/09/2016 0936   LDLCALC 134 (H) 04/09/2016 0936    CBC    Component Value Date/Time   WBC 6.7 03/19/2015 1106   RBC 4.23 03/19/2015 1106   HGB 13.3 03/19/2015 1106   HGB 12.3 08/10/2011 1133    HCT 40.6 03/19/2015 1106   HCT 36.5 08/10/2011 1133   PLT 261.0 03/19/2015 1106   PLT 208 08/10/2011 1133   MCV 96.0 03/19/2015 1106   MCV 94 08/10/2011 1133   MCH 31.4 05/28/2012 2147  MCHC 32.8 03/19/2015 1106   RDW 13.0 03/19/2015 1106   RDW 12.3 08/10/2011 1133   LYMPHSABS 2.5 03/19/2015 1106   MONOABS 0.5 03/19/2015 1106   EOSABS 0.1 03/19/2015 1106   BASOSABS 0.0 03/19/2015 1106    Hgb A1C No results found for: HGBA1C          Assessment & Plan:   Sore Throat secondary to PND:  Continue Astelin and Singulair 80 mg Depo IM today RST negative  Follow up with PCP if symptoms persist or worsen Shaheen Mende, NP

## 2016-10-19 DIAGNOSIS — K219 Gastro-esophageal reflux disease without esophagitis: Secondary | ICD-10-CM | POA: Insufficient documentation

## 2016-10-22 ENCOUNTER — Ambulatory Visit: Payer: BLUE CROSS/BLUE SHIELD | Admitting: Gynecology

## 2016-11-05 ENCOUNTER — Ambulatory Visit: Payer: BLUE CROSS/BLUE SHIELD | Attending: Gynecology | Admitting: Gynecology

## 2016-11-05 ENCOUNTER — Encounter: Payer: Self-pay | Admitting: Gynecology

## 2016-11-05 VITALS — BP 136/87 | HR 67 | Temp 98.3°F | Resp 20 | Wt 183.3 lb

## 2016-11-05 DIAGNOSIS — Z08 Encounter for follow-up examination after completed treatment for malignant neoplasm: Secondary | ICD-10-CM | POA: Diagnosis present

## 2016-11-05 DIAGNOSIS — Z8249 Family history of ischemic heart disease and other diseases of the circulatory system: Secondary | ICD-10-CM | POA: Diagnosis not present

## 2016-11-05 DIAGNOSIS — Z79899 Other long term (current) drug therapy: Secondary | ICD-10-CM | POA: Insufficient documentation

## 2016-11-05 DIAGNOSIS — K219 Gastro-esophageal reflux disease without esophagitis: Secondary | ICD-10-CM | POA: Insufficient documentation

## 2016-11-05 DIAGNOSIS — Z882 Allergy status to sulfonamides status: Secondary | ICD-10-CM | POA: Diagnosis not present

## 2016-11-05 DIAGNOSIS — Z90710 Acquired absence of both cervix and uterus: Secondary | ICD-10-CM | POA: Insufficient documentation

## 2016-11-05 DIAGNOSIS — C4499 Other specified malignant neoplasm of skin, unspecified: Secondary | ICD-10-CM

## 2016-11-05 DIAGNOSIS — Z825 Family history of asthma and other chronic lower respiratory diseases: Secondary | ICD-10-CM | POA: Diagnosis not present

## 2016-11-05 DIAGNOSIS — Z8544 Personal history of malignant neoplasm of other female genital organs: Secondary | ICD-10-CM

## 2016-11-05 DIAGNOSIS — Z888 Allergy status to other drugs, medicaments and biological substances status: Secondary | ICD-10-CM | POA: Insufficient documentation

## 2016-11-05 DIAGNOSIS — I10 Essential (primary) hypertension: Secondary | ICD-10-CM | POA: Insufficient documentation

## 2016-11-05 DIAGNOSIS — Z87891 Personal history of nicotine dependence: Secondary | ICD-10-CM | POA: Diagnosis not present

## 2016-11-05 DIAGNOSIS — Z7951 Long term (current) use of inhaled steroids: Secondary | ICD-10-CM | POA: Insufficient documentation

## 2016-11-05 DIAGNOSIS — Z86010 Personal history of colonic polyps: Secondary | ICD-10-CM | POA: Insufficient documentation

## 2016-11-05 DIAGNOSIS — Z881 Allergy status to other antibiotic agents status: Secondary | ICD-10-CM | POA: Diagnosis not present

## 2016-11-05 DIAGNOSIS — C519 Malignant neoplasm of vulva, unspecified: Secondary | ICD-10-CM

## 2016-11-05 DIAGNOSIS — M1990 Unspecified osteoarthritis, unspecified site: Secondary | ICD-10-CM | POA: Insufficient documentation

## 2016-11-05 DIAGNOSIS — J309 Allergic rhinitis, unspecified: Secondary | ICD-10-CM | POA: Insufficient documentation

## 2016-11-05 NOTE — Progress Notes (Signed)
Consult Note: Gyn-Onc   [name] 66 y.o. female  Chief Complaint  Patient presents with  . Paget's disease of Vulva    Assessment : Paget's disease of the vulva  . No evidence recurrent disease. Plan:    Patient return in 6 months.  Interval History:    Patient  returns today as previously scheduled. Since her last visit she's done well. Specifically she denies any vulvar symptoms, except for very occasional pruritus in the upper aspect of the right vulva e.   HPI:The patient initially underwent surgery for Paget's disease of the vulva in October 2011. There was focal involvement of the medial margin. The entire lesion is approximately 5 cm in diameter.   The patient previously had a supracervical hysterectomy but has her ovaries and cervix in situ.  She notes that she had some repair done at the time of her hysterectomy and at her introitus is tight and she has difficulty having intercourse and also has tearing. On the other hand her husband has had prostate cancer surgery making intercourse difficult as well.  Patient underwent wide local excision of a recurrent lesion on August 50,016 while all gross lesion was removed she did have positive surgical margins that were focal. Subsequently she underwent a another excision on 04/10/2015 with negative margins.     Review of Systems:10 point review of systems is negative except as noted in interval history.   Vitals: Blood pressure 136/87, pulse 67, temperature 98.3 F (36.8 C), temperature source Oral, resp. rate 20, weight 183 lb 4.8 oz (83.1 kg).  Physical Exam: General : The patient is a healthy woman in no acute distress.  HEENT: normocephalic, extraoccular movements normal; neck is supple without thyromegally  Lynphnodes: Supraclavicular and inguinal nodes not enlarged  Abdomen: Soft, non-tender, no ascites, no organomegally, no masses, no hernias  Pelvic:  EGBUS: Normal female there is a defect in the right vulva  consistent with prior excision.  No lesions are noted.    Lower extremities: No edema or varicosities. Normal range of motion   Allergies  Allergen Reactions  . Ibuprofen Hypertension  . Naprosyn [Naproxen] Nausea Only  . Other     Allergic to toothpaste- makes mouth peel Hay fever/dust mites/trees "365 allergic"   . Azithromycin Itching and Rash  . Sulfa Antibiotics Hives and Rash    Past Medical History:  Diagnosis Date  . Allergic rhinitis   . Arthritis   . GERD (gastroesophageal reflux disease)   . Headache(784.0)    Migraines  . History of adenomatous polyp of colon   . History of concussion    AS CHILD--  NO RESIDUAL  . History of palpitations   . Hypertension   . Paget's disease of vulva   . PONV (postoperative nausea and vomiting)    SEVERE  . Wears glasses     Past Surgical History:  Procedure Laterality Date  . mucoid cyst removal thumb    . 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 PULLEY RELEASE RIGHT THUMB  03-23-2011  . SIMPLE VULVECTOMY  03-24-2010   right side  . TONSILLECTOMY  1977  . TRIGGER FINGER RELEASE    . VAGINAL HYSTERECTOMY  1989   and Anterior and posterior repair's for prolapse  . VULVECTOMY Right 01/10/2015   Procedure: RIGHT WIDE LOCAL EXCISION VULVECTOMY;  Surgeon: [name], MD;  Location: [facility];  Service: Gynecology;  Laterality: Right;  Marland Kitchen VULVECTOMY Right 04/10/2015   Procedure: WIDE LOCAL EXCISION VULVA;  Surgeon: [name], MD;  Location: [facility];  Service: Gynecology;  Laterality: Right;  . WISDOM TOOTH EXTRACTION      Current Outpatient Prescriptions  Medication Sig Dispense Refill  . albuterol (PROVENTIL HFA;VENTOLIN HFA) 108 (90 Base) MCG/ACT inhaler Inhale 2 puffs into the lungs every 6 (six) hours as needed. Reported on 10/24/2015 1 Inhaler 0  . azelastine (ASTELIN) 0.1 % nasal  spray Place 1 spray into both nostrils 2 (two) times daily. Use in each nostril as directed 30 mL 0  . doxylamine, Sleep, (UNISOM) 25 MG tablet Take 25 mg by mouth at bedtime as needed.    . fluticasone (FLONASE) 50 MCG/ACT nasal spray Place 2 sprays into the nose daily as needed.     . lansoprazole (PREVACID) 30 MG capsule Take 30 mg by mouth 2 (two) times daily before a meal.     . levocetirizine (XYZAL) 5 MG tablet Take 5 mg by mouth every evening.    Marland Kitchen losartan-hydrochlorothiazide (HYZAAR) 100-25 MG tablet TAKE 1 TABLET DAILY IN THE MORNING 90 tablet 1  . metoprolol tartrate (LOPRESSOR) 25 MG tablet Take one-fourth ( 6.25 mg) tablet by mouth two times a day. 45 tablet 1  . montelukast (SINGULAIR) 10 MG tablet TAKE 1 TABLET EVERY MORNING 90 tablet 3  . Triamcinolone & Emollient (DERMASORB TA) 0.1 % KIT   0  . triamcinolone ointment (KENALOG) 0.5 % Apply 1 application topically 2 (two) times daily. 30 g 1   No current facility-administered medications for this visit.     Social History   Social History  . Marital status: Married    Spouse name: N/A  . Number of children: N/A  . Years of education: N/A   Occupational History  . Not on file.   Social History Main Topics  . Smoking status: Former Smoker    Years: 5.00    Types: Cigarettes    Quit date: 04/15/1989  . Smokeless tobacco: Never Used  . Alcohol use 0.6 oz/week    1 Glasses of wine per week     Comment: occasional wine  . Drug use: No  . Sexual activity: Not on file   Other Topics Concern  . Not on file   Social History Narrative  . No narrative on file    Family History  Problem Relation Age of Onset  . Cancer Maternal Grandmother   . Hypertension Father   . Heart disease Father   . Asthma Father   . Hypertension Mother   . Heart disease Mother   . Breast cancer Paternal Grandmother 59  . Colon cancer Paternal Uncle   . Allergic rhinitis Neg Hx   . Angioedema Neg Hx   . Eczema Neg Hx   .  Immunodeficiency Neg Hx   . Urticaria Neg Hx       [name], MD 11/05/2016, 12:11 PM

## 2016-11-05 NOTE — Patient Instructions (Signed)
Plan to follow up in six months or sooner if needed.  Please call our office for any new symptoms or concerns.

## 2016-11-19 ENCOUNTER — Other Ambulatory Visit: Payer: Self-pay | Admitting: Family Medicine

## 2016-12-01 ENCOUNTER — Other Ambulatory Visit: Payer: Self-pay | Admitting: Allergy

## 2016-12-07 ENCOUNTER — Other Ambulatory Visit: Payer: Self-pay | Admitting: Family Medicine

## 2017-01-19 ENCOUNTER — Ambulatory Visit: Payer: BLUE CROSS/BLUE SHIELD | Admitting: Allergy

## 2017-02-04 ENCOUNTER — Ambulatory Visit (INDEPENDENT_AMBULATORY_CARE_PROVIDER_SITE_OTHER): Payer: BLUE CROSS/BLUE SHIELD | Admitting: Allergy

## 2017-02-04 ENCOUNTER — Encounter: Payer: Self-pay | Admitting: Allergy

## 2017-02-04 VITALS — BP 116/70 | HR 67 | Temp 98.4°F | Resp 18 | Ht 64.0 in

## 2017-02-04 DIAGNOSIS — H1010 Acute atopic conjunctivitis, unspecified eye: Secondary | ICD-10-CM

## 2017-02-04 DIAGNOSIS — K219 Gastro-esophageal reflux disease without esophagitis: Secondary | ICD-10-CM | POA: Diagnosis not present

## 2017-02-04 DIAGNOSIS — J309 Allergic rhinitis, unspecified: Secondary | ICD-10-CM | POA: Diagnosis not present

## 2017-02-04 DIAGNOSIS — J4520 Mild intermittent asthma, uncomplicated: Secondary | ICD-10-CM | POA: Diagnosis not present

## 2017-02-04 NOTE — Progress Notes (Signed)
Follow-up Note  RE: [name] MRN: [id number] DOB: 06/02/1951 Date of Office Visit: 02/04/2017   History of present illness: [name] is a 66 y.o. female presenting today for follow-up of allergic rhinoconjunctivitis, mild asthma, reflux.  She was last seen in the office on 09/17/16 by myself.  At that visit I recommended that she use Astelin and her flonase, xyzal and singulair.  She states that the Astelin has been very helpful.  She initially wasn't using the flonase with the Astelin but then started to do so and reports even more improvement with combination nasal sprays.   She does have a saline rinse kit but states has not thought to use it.  She does report she still has some morning congestion and drainage.   She has not needed to use any albuterol and denies any nighttime awakenings.  No ED or UC visits or oral steroid needs.  She continues to take prevacid for reflux control.       Review of systems: Review of Systems  Constitutional: Negative for chills, fever and malaise/fatigue.  HENT: Positive for congestion. Negative for ear discharge, ear pain, hearing loss, nosebleeds, sinus pain, sore throat and tinnitus.   Eyes: Negative for pain, discharge and redness.  Respiratory: Negative for cough, shortness of breath and wheezing.   Cardiovascular: Negative for chest pain.  Gastrointestinal: Negative for abdominal pain, diarrhea, heartburn, nausea and vomiting.  Musculoskeletal: Negative for joint pain.  Skin: Negative for itching and rash.  Neurological: Negative for headaches.    All other systems negative unless noted above in HPI  Past medical/social/surgical/family history have been reviewed and are unchanged unless specifically indicated below.  No changes  Medication List: Allergies as of 02/04/2017      Reactions   Ibuprofen Hypertension   Naprosyn [naproxen] Nausea Only   Other    Allergic to toothpaste- makes mouth peel Hay fever/dust mites/trees  "365 allergic"    Azithromycin Itching, Rash   Sulfa Antibiotics Hives, Rash      Medication List       Accurate as of 02/04/17 11:59 AM. Always use your most recent med list.          albuterol 108 (90 Base) MCG/ACT inhaler Commonly known as:  PROVENTIL HFA;VENTOLIN HFA Inhale 2 puffs into the lungs every 6 (six) hours as needed. Reported on 10/24/2015   VENTOLIN HFA 108 (90 Base) MCG/ACT inhaler Generic drug:  albuterol USE 2 INHALATIONS EVERY 6 HOURS AS NEEDED   azelastine 0.1 % nasal spray Commonly known as:  ASTELIN Place 1 spray into both nostrils 2 (two) times daily. Use in each nostril as directed   DERMASORB TA 0.1 % Kit   fluticasone 50 MCG/ACT nasal spray Commonly known as:  FLONASE Place 2 sprays into the nose daily as needed.   lansoprazole 30 MG capsule Commonly known as:  PREVACID Take 30 mg by mouth 2 (two) times daily before a meal.   losartan-hydrochlorothiazide 100-25 MG tablet Commonly known as:  HYZAAR TAKE 1 TABLET DAILY IN THE MORNING   metoprolol tartrate 25 MG tablet Commonly known as:  LOPRESSOR TAKE ONE-FOURTH (1/4) TABLET (6.25 MG) TWICE A DAY   montelukast 10 MG tablet Commonly known as:  SINGULAIR TAKE 1 TABLET EVERY MORNING   triamcinolone ointment 0.5 % Commonly known as:  KENALOG Apply 1 application topically 2 (two) times daily.   UNISOM 25 MG tablet Generic drug:  doxylamine (Sleep) Take 25 mg by mouth at bedtime  as needed.   XYZAL 5 MG tablet Generic drug:  levocetirizine Take 5 mg by mouth every evening.       Known medication allergies: Allergies  Allergen Reactions  . Ibuprofen Hypertension  . Naprosyn [Naproxen] Nausea Only  . Other     Allergic to toothpaste- makes mouth peel Hay fever/dust mites/trees "365 allergic"   . Azithromycin Itching and Rash  . Sulfa Antibiotics Hives and Rash     Physical examination: Blood pressure 116/70, pulse 67, temperature 98.4 F (36.9 C), temperature source Oral,  resp. rate 18, height 5' 4"  (1.626 m), SpO2 95 %.  General: Alert, interactive, in no acute distress. HEENT: PERRLA, TMs pearly gray, turbinates mildly edematous with clear discharge, post-pharynx non erythematous. Neck: Supple without lymphadenopathy. Lungs: Clear to auscultation without wheezing, rhonchi or rales. {no increased work of breathing. CV: Normal S1, S2 without murmurs. Abdomen: Nondistended, nontender. Skin: Warm and dry, without lesions or rashes. Extremities:  No clubbing, cyanosis or edema. Neuro:   Grossly intact.  Diagnositics/Labs:  Spirometry: FEV1: 2.18L  93%, FVC: 3.15L  107%, ratio consistent with nonobstructive pattern  Assessment and plan: Allergic rhinoconjunctivitis   - She most likely has a component of postnasal drainage that is irritating the back of the throat is irritating to the vocal cords leading to cough   - Start nasal antihistamine Astelin 2 sprays twice a day.      May use Astelin together with Flonase 2 sprays each nostril daily especially if you have components of nasal stuffy/congested and runny/drainage    - Continue Xyzal 108m daily   - Continue Singulair 170mdaily -- take at bedtime  Mild intermittent asthma versus VCD   - Use albuterol inhaler 2 puffs every 4-6 hours as needed for cough, wheeze, difficulty breathing, chest tightness.  Monitor frequency of albuterol use.     - Singulair as above   - If she does not be below asthma goals will consider resuming an ICS Asthma control goals:   Full participation in all desired activities (may need albuterol before activity)  Albuterol use two time or less a week on average (not counting use with activity)  Cough interfering with sleep two time or less a month  Oral steroids no more than once a year  No hospitalizations  GERD   - Continue Prevacid daily for reflux control   Follow-up 4 months or sooner if needed   I appreciate the opportunity to take part in [name]'s care.  Please do not hesitate to contact me with questions.  Sincerely,   ShPrudy FeelerMD Allergy/Immunology [facility]

## 2017-02-04 NOTE — Patient Instructions (Signed)
Use nasal saline rinse daily in the morning then follow with your nasal sprays  Continue nasal antihistamine Astelin 2 sprays twice a day.   May use Astelin together with Flonase 2 sprays each nostril daily especially if you have components of nasal stuffy/congested and runny/drainage   Continue Xyzal 5mg  daily  Continue Singulair 10mg  daily -- take at bedtime  Use albuterol inhaler 2 puffs every 4-6 hours as needed for cough, wheeze, difficulty breathing, chest tightness.  Monitor frequency of albuterol use.    Continue Prevacid daily for reflux control    Follow-up 4 months or sooner if needed

## 2017-03-01 ENCOUNTER — Other Ambulatory Visit: Payer: Self-pay | Admitting: Allergy

## 2017-04-02 ENCOUNTER — Encounter: Payer: Self-pay | Admitting: Family Medicine

## 2017-04-02 ENCOUNTER — Ambulatory Visit (INDEPENDENT_AMBULATORY_CARE_PROVIDER_SITE_OTHER): Payer: BLUE CROSS/BLUE SHIELD | Admitting: Family Medicine

## 2017-04-02 DIAGNOSIS — J3089 Other allergic rhinitis: Secondary | ICD-10-CM

## 2017-04-02 MED ORDER — HYDROCODONE-HOMATROPINE 5-1.5 MG/5ML PO SYRP: 5.0000 mL | ORAL_SOLUTION | Freq: Three times a day (TID) | ORAL | 0 refills | Status: DC | PRN

## 2017-04-02 NOTE — Assessment & Plan Note (Signed)
Symptoms most consistent with postviral cough versus cough from postnasal drip from allergic rhinitis. There is no focal findings to indicate bacterial illness. We'll provide her with a cough suppressant. Discussed that this could make her drowsy. She'll continue to monitor symptoms and if not improving she'll be reevaluated.

## 2017-04-02 NOTE — Patient Instructions (Signed)
Nice to see you. I suspect postviral cough or your cough is related to your allergies. We'll provide you with Hycodan to take for cough at night. This may make you very drowsy so be very careful when taking it. If you develop worsening symptoms, shortness of breath, cough productive of blood, or fevers or any new or changing symptoms please be evaluated.

## 2017-04-02 NOTE — Progress Notes (Signed)
  Tommi Rumps, MD Phone: [phone]  ICIS BUDREAU is a 66 y.o. female who presents today for same-day visit.  Patient notes cough over the last 3 weeks. She notes no significant sinus congestion. Does note some postnasal drip. Clear mucus with cough. No significant chest congestion. Notes it's in her throat. Some nasal drainage. Does note feeling like she is going to have posttussive emesis though has not vomited. Does have a fairly significant history of allergies and is on multiple medications for this. Also on reflux medications. No fevers or shortness of breath.   ROS see history of present illness  Objective  Physical Exam Vitals:   04/02/17 1014  BP: 128/70  Pulse: 67  Resp: 16  Temp: 98.7 F (37.1 C)  SpO2: 96%    BP Readings from Last 3 Encounters:  04/02/17 128/70  02/04/17 116/70  11/05/16 136/87   Wt Readings from Last 3 Encounters:  04/02/17 184 lb (83.5 kg)  11/05/16 183 lb 4.8 oz (83.1 kg)  09/28/16 185 lb (83.9 kg)    Physical Exam  Constitutional: No distress.  HENT:  Head: Normocephalic and atraumatic.  Mouth/Throat: Oropharynx is clear and moist. No oropharyngeal exudate.  Normal TMs  Eyes: Pupils are equal, round, and reactive to light. Conjunctivae are normal.  Neck: Neck supple.  Cardiovascular: Normal rate, regular rhythm and normal heart sounds.   Pulmonary/Chest: Effort normal and breath sounds normal.  Lymphadenopathy:    She has no cervical adenopathy.  Skin: She is not diaphoretic.     Assessment/Plan: Please see individual problem list.  Allergic rhinitis Symptoms most consistent with postviral cough versus cough from postnasal drip from allergic rhinitis. There is no focal findings to indicate bacterial illness. We'll provide her with a cough suppressant. Discussed that this could make her drowsy. She'll continue to monitor symptoms and if not improving she'll be reevaluated.   No orders of the defined types were placed in  this encounter.   Meds ordered this encounter  Medications  . HYDROcodone-homatropine (HYCODAN) 5-1.5 MG/5ML syrup    Sig: Take 5 mLs by mouth every 8 (eight) hours as needed for cough.    Dispense:  120 mL    Refill:  0    Tommi Rumps, MD [place]

## 2017-04-04 ENCOUNTER — Telehealth: Payer: Self-pay

## 2017-04-04 NOTE — Telephone Encounter (Signed)
PLEASE NOTE: All timestamps contained within this report are represented as [place] Standard Time. CONFIDENTIALTY NOTICE: This fax transmission is intended only for the addressee. It contains information that is legally privileged, confidential or otherwise protected from use or disclosure. If you are not the intended recipient, you are strictly prohibited from reviewing, disclosing, copying using or disseminating any of this information or taking any action in reliance on or regarding this information. If you have received this fax in error, please notify us immediately by telephone so that we can arrange for its return to [place]. Phone: [phone], Toll-Free: [phone], Fax: [phone] Page: 1 of 2 Call Id: [id number] [place] Patient Name: [name] Gender: Female DOB: 05/25/1951 Age: 66 Y 2 M 19 D Return Phone Number: [phone] (Primary), [phone] (Secondary) Address: City/State/Zip: [place] [address] Client [facility] Night - Client Client Site [place] Physician Eliezer Lofts - MD Contact Type Call Who Is Calling Patient / Member / Family / Caregiver Call Type Triage / Clinical Relationship To Patient Self Return Phone Number [phone] (Primary) Chief Complaint Cough Reason for Call Symptomatic / Request for [place] states she has been coughing and at times she feels like vomiting because coughing. Translation No Nurse Assessment Nurse: Harlow Mares, RN, Suanne Marker Date/Time Eilene Ghazi Time): 04/02/2017 8:10:22 AM Confirm and document reason for call. If symptomatic, describe symptoms. ---Caller states she has been coughing and at times she feels like vomiting because coughing. Reports coughing spells. Clear mucous. Nasal drainage, yellow with some blood. Using nasal saline rinses. Does  the patient have any new or worsening symptoms? ---Yes Will a triage be completed? ---Yes Related visit to physician within the last 2 weeks? ---No Does the PT have any chronic conditions? (i.e. diabetes, asthma, etc.) ---Yes List chronic conditions. ---HTN; acid reflux Is this a behavioral health or substance abuse call? ---No Guidelines Guideline Title Affirmed Question Affirmed Notes Nurse Date/Time (Eastern Time) Cough - Acute Productive [1] Continuous (nonstop) coughing interferes with work or school AND [2] no improvement using cough treatment per Care Advice Harlow Mares, RN, Suanne Marker 04/02/2017 8:12:22 AM Disp. Time Eilene Ghazi Time) Disposition Final User 04/02/2017 8:21:22 AM See Physician within 24 Hours Yes Harlow Mares, RN, Suanne Marker PLEASE NOTE: All timestamps contained within this report are represented as [place] Standard Time. CONFIDENTIALTY NOTICE: This fax transmission is intended only for the addressee. It contains information that is legally privileged, confidential or otherwise protected from use or disclosure. If you are not the intended recipient, you are strictly prohibited from reviewing, disclosing, copying using or disseminating any of this information or taking any action in reliance on or regarding this information. If you have received this fax in error, please notify us immediately by telephone so that we can arrange for its return to [place]. Phone: [phone], Toll-Free: [phone], Fax: [phone] Page: 2 of 2 Call Id: [id number] [place] Disagree/Comply Comply Caller Understands Yes PreDisposition Did not know what to do Care Advice Given Per Guideline SEE PHYSICIAN WITHIN 24 HOURS: * IF OFFICE WILL BE OPEN: You need to be seen within the next 24 hours. Call your doctor when the office opens, and make an appointment. COUGH MEDICINES: * OTC COUGH SYRUPS: The most common cough suppressant in OTC cough medications is dextromethorphan. Often the letters 'DM' appear in the  name. * OTC COUGH DROPS: Cough drops can help a lot, especially for mild  coughs. They reduce coughing by soothing your irritated throat and removing that tickle sensation in the back of the throat. Cough drops also have the advantage of portability - you can carry them with you. * HOME REMEDY - HARD CANDY: Hard candy works just as well as medicine-flavored OTC cough drops. People who have diabetes should use sugar-free candy. * HOME REMEDY - HONEY: This old home remedy has been shown to help decrease coughing at night. The adult dosage is 2 teaspoons (10 ml) at bedtime. Honey should not be given to infants under one year of age. * Cough syrups containing the cough suppressant dextromethorphan (DM) may help decrease your cough. Cough syrups work best for coughs that keep you awake at night. They can also sometimes help in the late stages of a respiratory infection when the cough is dry and hacking. They can be used along with cough drops. OTC COUGH SYRUP - DEXTROMETHORPHAN: * Examples: Benylin, Robitussin DM, Vicks 44 Cough Relief HUMIDIFIER: If the air is dry, use a humidifier in the bedroom. (Reason: dry air makes coughs worse) COUGHING SPELLS: * Drink warm fluids. Inhale warm mist. (Reason: both relax the airway and loosen up the phlegm) * Suck on cough drops or hard candy to coat the irritated throat. CALL BACK IF: * Difficulty breathing occurs * You become worse. CARE ADVICE given per Cough - Acute Productive (Adult) guideline. Comments User: Tamala Fothergill, RN Date/Time Eilene Ghazi Time): 04/02/2017 8:20:47 AM Appt made at the [facility] office for 10:15am with Dr. Caryl Bis. Referrals [facility]

## 2017-04-04 NOTE — Telephone Encounter (Signed)
Per chart review tab pt seen Sat Clinic.

## 2017-04-15 ENCOUNTER — Ambulatory Visit: Payer: BLUE CROSS/BLUE SHIELD | Admitting: Allergy

## 2017-04-20 ENCOUNTER — Ambulatory Visit: Payer: Self-pay | Admitting: *Deleted

## 2017-04-20 NOTE — Telephone Encounter (Signed)
Pt c/o dizziness and feeling weak. Also having cold sweats. Vomited the day before but none today.  Care advice given to her and an appointment was made for Friday.  Reason for Disposition . [1] MILD dizziness (e.g., vertigo; walking normally) AND [2] has been evaluated by physician for this  Answer Assessment - Initial Assessment Questions 1. DESCRIPTION: "Describe your dizziness."     Like the room is spinning 2. VERTIGO: "Do you feel like either you or the room is spinning or tilting?"      yes 3. LIGHTHEADED: "Do you feel lightheaded?" (e.g., somewhat faint, woozy, weak upon standing)     Weak upon standing 4. SEVERITY: "How bad is it?"  "Can you walk?"   - MILD - Feels unsteady but walking normally.   - MODERATE - Feels very unsteady when walking, but not falling; interferes with normal activities (e.g., school, work) .   - SEVERE - Unable to walk without falling (requires assistance).     mild 5. ONSET:  "When did the dizziness begin?"     yesterday 6. AGGRAVATING FACTORS: "Does anything make it worse?" (e.g., standing, change in head position)     Worse when changing postions 7. CAUSE: "What do you think is causing the dizziness?"     no 8. RECURRENT SYMPTOM: "Have you had dizziness before?" If so, ask: "When was the last time?" "What happened that time?"     About 20 years ago had vertigo, flying in a plane 9. OTHER SYMPTOMS: "Do you have any other symptoms?" (e.g., headache, weakness, numbness, vomiting, earache)     Just tired 10. PREGNANCY: "Is there any chance you are pregnant?" "When was your last menstrual period?"       no  Protocols used: DIZZINESS - VERTIGO-A-AH

## 2017-04-21 ENCOUNTER — Telehealth: Payer: Self-pay | Admitting: Family Medicine

## 2017-04-21 NOTE — Telephone Encounter (Unsigned)
Copied from [place] [phone]. Topic: General - Other >> Apr 21, 2017  5:44 PM Neva Seat wrote: Pt called to let us know she has made an appt with Dr. Jerline Pain for her recent symptoms.

## 2017-04-22 ENCOUNTER — Ambulatory Visit: Payer: BLUE CROSS/BLUE SHIELD | Admitting: Family Medicine

## 2017-04-22 NOTE — Telephone Encounter (Signed)
See 04/20/17 triage note.

## 2017-05-13 ENCOUNTER — Other Ambulatory Visit: Payer: Self-pay | Admitting: Family Medicine

## 2017-05-13 ENCOUNTER — Ambulatory Visit: Payer: BLUE CROSS/BLUE SHIELD | Attending: Gynecology | Admitting: Gynecology

## 2017-05-13 ENCOUNTER — Encounter: Payer: Self-pay | Admitting: Gynecology

## 2017-05-13 VITALS — BP 144/82 | HR 59 | Temp 98.4°F | Resp 18 | Ht 64.0 in | Wt 183.4 lb

## 2017-05-13 DIAGNOSIS — Z90710 Acquired absence of both cervix and uterus: Secondary | ICD-10-CM | POA: Diagnosis not present

## 2017-05-13 DIAGNOSIS — Z08 Encounter for follow-up examination after completed treatment for malignant neoplasm: Secondary | ICD-10-CM | POA: Insufficient documentation

## 2017-05-13 DIAGNOSIS — C4499 Other specified malignant neoplasm of skin, unspecified: Secondary | ICD-10-CM

## 2017-05-13 DIAGNOSIS — L292 Pruritus vulvae: Secondary | ICD-10-CM

## 2017-05-13 DIAGNOSIS — Z79899 Other long term (current) drug therapy: Secondary | ICD-10-CM | POA: Diagnosis not present

## 2017-05-13 DIAGNOSIS — Z87891 Personal history of nicotine dependence: Secondary | ICD-10-CM | POA: Insufficient documentation

## 2017-05-13 DIAGNOSIS — Z8544 Personal history of malignant neoplasm of other female genital organs: Secondary | ICD-10-CM | POA: Diagnosis not present

## 2017-05-13 DIAGNOSIS — Z1231 Encounter for screening mammogram for malignant neoplasm of breast: Secondary | ICD-10-CM

## 2017-05-13 DIAGNOSIS — C519 Malignant neoplasm of vulva, unspecified: Secondary | ICD-10-CM

## 2017-05-13 NOTE — Patient Instructions (Signed)
Follow up with Dr. Fermin Schwab in 6 months as scheduled.

## 2017-05-13 NOTE — Progress Notes (Signed)
Consult Note: Gyn-Onc   [name] 66 y.o. female  Chief Complaint  Patient presents with  . Paget's disease of vulva    Assessment : Paget's disease of the vulva  . No evidence recurrent disease. Plan:    Patient return in 6 months.  She is encouraged to continue to have annual mammograms and should get her flu vaccination soon.  Interval History:    Patient  returns today as previously scheduled. Since her last visit she's done well. Specifically she denies any vulvar symptoms, except for very occasional pruritus in the upper aspect of the right vulva e.   HPI:The patient initially underwent surgery for Paget's disease of the vulva in October 2011. There was focal involvement of the medial margin. The entire lesion is approximately 5 cm in diameter.   The patient previously had a supracervical hysterectomy but has her ovaries and cervix in situ.  She notes that she had some repair done at the time of her hysterectomy and at her introitus is tight and she has difficulty having intercourse and also has tearing. On the other hand her husband has had prostate cancer surgery making intercourse difficult as well.  Patient underwent wide local excision of a recurrent lesion on August 50,016 while all gross lesion was removed she did have positive surgical margins that were focal. Subsequently she underwent a another excision on 04/10/2015 with negative margins.     Review of Systems:10 point review of systems is negative except as noted in interval history.   Vitals: Blood pressure (!) 144/82, pulse (!) 59, temperature 98.4 F (36.9 C), temperature source Oral, resp. rate 18, height _0  (1.626 m), weight 183 lb 6.4 oz (83.2 kg), SpO2 100 %.  Physical Exam: General : The patient is a healthy woman in no acute distress.  HEENT: normocephalic, extraoccular movements normal; neck is supple without thyromegally  Lynphnodes: Supraclavicular and inguinal nodes not enlarged  Abdomen:  Soft, non-tender, no ascites, no organomegally, no masses, no hernias  Pelvic:  EGBUS: Normal female there is a defect in the right vulva consistent with prior excision.  No lesions are noted.    Lower extremities: No edema or varicosities. Normal range of motion   Allergies  Allergen Reactions  . Ibuprofen Hypertension  . Naprosyn [Naproxen] Nausea Only  . Other     Allergic to toothpaste- makes mouth peel Hay fever/dust mites/trees "365 allergic"   . Azithromycin Itching and Rash  . Sulfa Antibiotics Hives and Rash    Past Medical History:  Diagnosis Date  . Allergic rhinitis   . Arthritis   . GERD (gastroesophageal reflux disease)   . Headache(784.0)    Migraines  . History of adenomatous polyp of colon   . History of concussion    AS CHILD--  NO RESIDUAL  . History of palpitations   . Hypertension   . Paget's disease of vulva   . PONV (postoperative nausea and vomiting)    SEVERE  . Wears glasses     Past Surgical History:  Procedure Laterality Date  . mucoid cyst removal thumb    . 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 PULLEY RELEASE RIGHT THUMB  03-23-2011  . SIMPLE VULVECTOMY  03-24-2010   right side  . TONSILLECTOMY  1977  . TRIGGER FINGER RELEASE    . VAGINAL HYSTERECTOMY  1989   and Anterior and posterior repair's for prolapse  .  VULVECTOMY Right 01/10/2015   Procedure: RIGHT WIDE LOCAL EXCISION VULVECTOMY;  Surgeon: [name], MD;  Location: [facility];  Service: Gynecology;  Laterality: Right;  Marland Kitchen VULVECTOMY Right 04/10/2015   Procedure: WIDE LOCAL EXCISION VULVA;  Surgeon: [name], MD;  Location: [facility];  Service: Gynecology;  Laterality: Right;  . WISDOM TOOTH EXTRACTION      Current Outpatient Medications  Medication Sig Dispense Refill  . albuterol (PROVENTIL HFA;VENTOLIN HFA) 108 (90 Base) MCG/ACT  inhaler Inhale 2 puffs into the lungs every 6 (six) hours as needed. Reported on 10/24/2015 1 Inhaler 0  . azelastine (ASTELIN) 0.1 % nasal spray Place 1 spray into both nostrils 2 (two) times daily. Use in each nostril as directed 30 mL 0  . doxylamine, Sleep, (UNISOM) 25 MG tablet Take 25 mg by mouth at bedtime as needed.    . fluticasone (FLONASE) 50 MCG/ACT nasal spray Place 2 sprays into the nose daily as needed.     Marland Kitchen HYDROcodone-homatropine (HYCODAN) 5-1.5 MG/5ML syrup Take 5 mLs by mouth every 8 (eight) hours as needed for cough. 120 mL 0  . lansoprazole (PREVACID) 30 MG capsule Take 30 mg by mouth 2 (two) times daily before a meal.     . levocetirizine (XYZAL) 5 MG tablet Take 5 mg by mouth every evening.    Marland Kitchen losartan-hydrochlorothiazide (HYZAAR) 100-25 MG tablet TAKE 1 TABLET DAILY IN THE MORNING 90 tablet 1  . metoprolol tartrate (LOPRESSOR) 25 MG tablet TAKE ONE-FOURTH (1/4) TABLET (6.25 MG) TWICE A DAY 45 tablet 1  . montelukast (SINGULAIR) 10 MG tablet TAKE 1 TABLET EVERY MORNING 90 tablet 3  . Triamcinolone & Emollient (DERMASORB TA) 0.1 % KIT   0  . triamcinolone ointment (KENALOG) 0.5 % Apply 1 application topically 2 (two) times daily. 30 g 1  . VENTOLIN HFA 108 (90 Base) MCG/ACT inhaler USE 2 INHALATIONS EVERY 6 HOURS AS NEEDED 1 Inhaler 1   No current facility-administered medications for this visit.     Social History   Socioeconomic History  . Marital status: Married    Spouse name: Not on file  . Number of children: Not on file  . Years of education: Not on file  . Highest education level: Not on file  Social Needs  . Financial resource strain: Not on file  . Food insecurity - worry: Not on file  . Food insecurity - inability: Not on file  . Transportation needs - medical: Not on file  . Transportation needs - non-medical: Not on file  Occupational History  . Not on file  Tobacco Use  . Smoking status: Former Smoker    Years: 5.00    Types: Cigarettes     Last attempt to quit: 04/15/1989    Years since quitting: 28.0  . Smokeless tobacco: Never Used  Substance and Sexual Activity  . Alcohol use: Yes    Alcohol/week: 0.6 oz    Types: 1 Glasses of wine per week    Comment: occasional wine  . Drug use: No  . Sexual activity: Not on file  Other Topics Concern  . Not on file  Social History Narrative  . Not on file    Family History  Problem Relation Age of Onset  . Cancer Maternal Grandmother   . Hypertension Father   . Heart disease Father   . Asthma Father   . Hypertension Mother   . Heart disease Mother   . Breast cancer Paternal Grandmother 49  .  Colon cancer Paternal Uncle   . Allergic rhinitis Neg Hx   . Angioedema Neg Hx   . Eczema Neg Hx   . Immunodeficiency Neg Hx   . Urticaria Neg Hx       [name], MD 05/13/2017, 12:38 PM

## 2017-05-18 ENCOUNTER — Encounter: Payer: Self-pay | Admitting: Intensive Care

## 2017-05-18 ENCOUNTER — Emergency Department: Payer: BLUE CROSS/BLUE SHIELD

## 2017-05-18 ENCOUNTER — Other Ambulatory Visit: Payer: Self-pay | Admitting: Family Medicine

## 2017-05-18 ENCOUNTER — Emergency Department
Admission: EM | Admit: 2017-05-18 | Discharge: 2017-05-18 | Disposition: A | Discharge status: Home / Self Care | Payer: BLUE CROSS/BLUE SHIELD | Attending: Student in an Organized Health Care Education/Training Program | Admitting: Student in an Organized Health Care Education/Training Program

## 2017-05-18 ENCOUNTER — Ambulatory Visit: Payer: Self-pay | Admitting: *Deleted

## 2017-05-18 DIAGNOSIS — Z87891 Personal history of nicotine dependence: Secondary | ICD-10-CM | POA: Diagnosis not present

## 2017-05-18 DIAGNOSIS — R05 Cough: Secondary | ICD-10-CM | POA: Diagnosis not present

## 2017-05-18 DIAGNOSIS — R0789 Other chest pain: Secondary | ICD-10-CM | POA: Diagnosis present

## 2017-05-18 DIAGNOSIS — I10 Essential (primary) hypertension: Secondary | ICD-10-CM | POA: Insufficient documentation

## 2017-05-18 DIAGNOSIS — Z79899 Other long term (current) drug therapy: Secondary | ICD-10-CM | POA: Insufficient documentation

## 2017-05-18 DIAGNOSIS — R0781 Pleurodynia: Secondary | ICD-10-CM

## 2017-05-18 DIAGNOSIS — R059 Cough, unspecified: Secondary | ICD-10-CM

## 2017-05-18 IMAGING — CT CT CHEST W/O CM
2 of 3 series · 15 of 36 positions shown, 18 images · non-contrast
Comparison: Chest x-ray earlier today.

CLINICAL DATA: Right lateral rib pain and cough. Abnormal chest
x-ray.

EXAM:
CT CHEST WITHOUT CONTRAST
TECHNIQUE: Multidetector CT imaging of the chest was performed following the
standard protocol without IV contrast.

[Series 2: thorax · axial · 0.69mm/px · z∈[-332,-78]mm · 12 of 151 slices shown, 15 images]
[im 12/151  mediastinal]
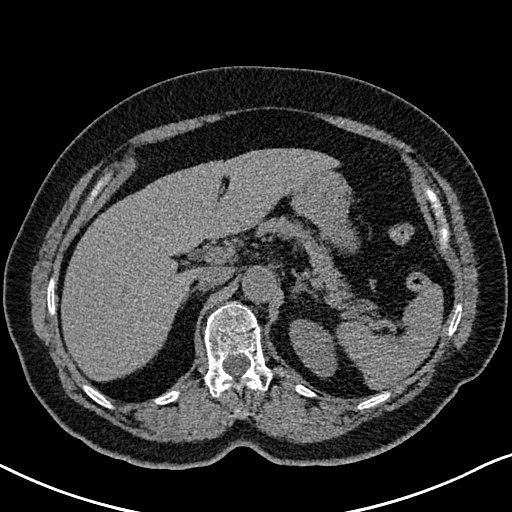
[im 12/151  lung]
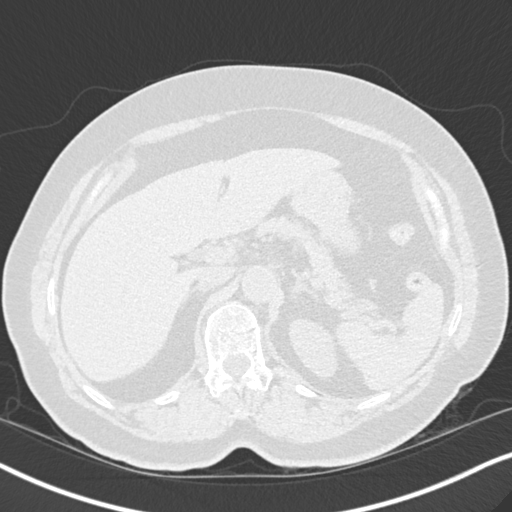
[im 23/151  lung]
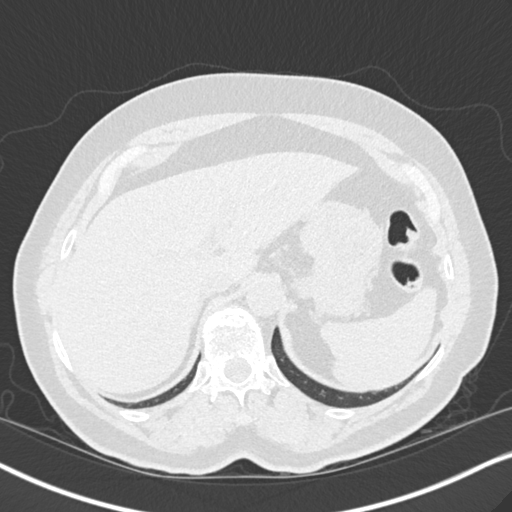
[im 34/151  lung]
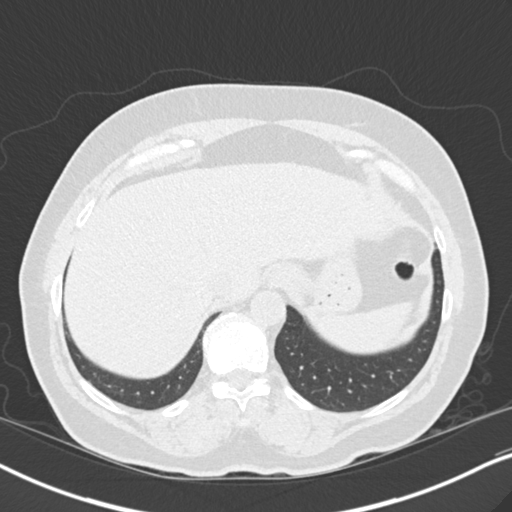
[im 45/151  lung]
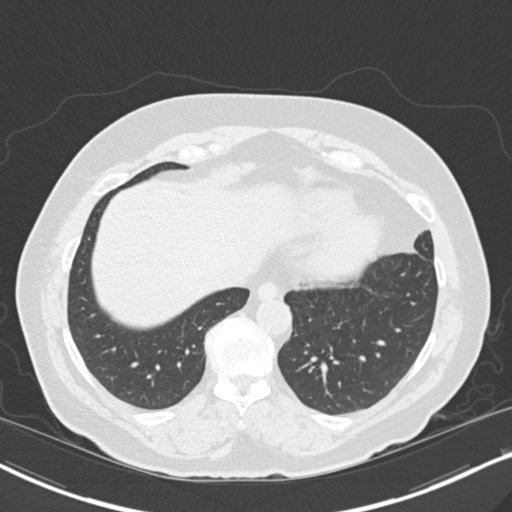
[im 56/151  mediastinal]
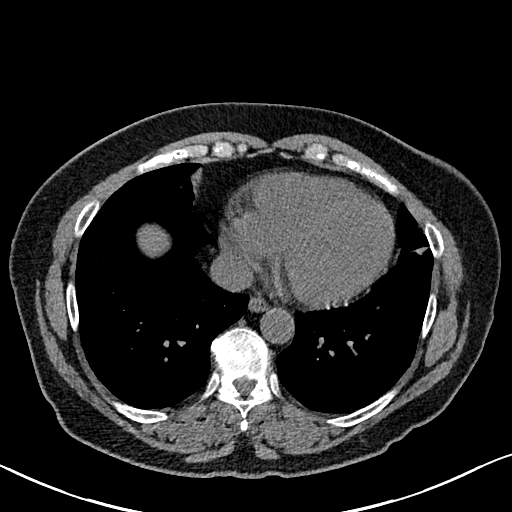
[im 56/151  lung]
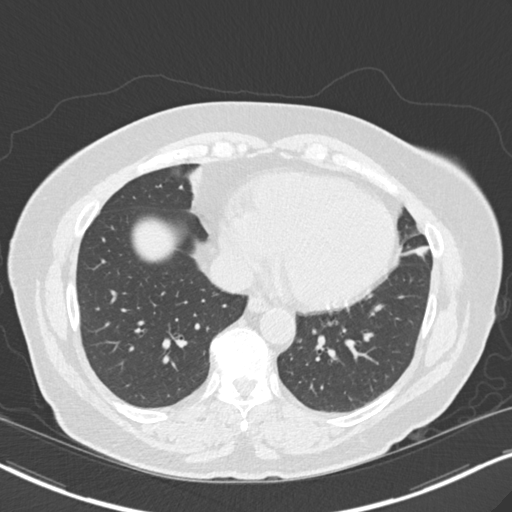
[im 67/151  lung]
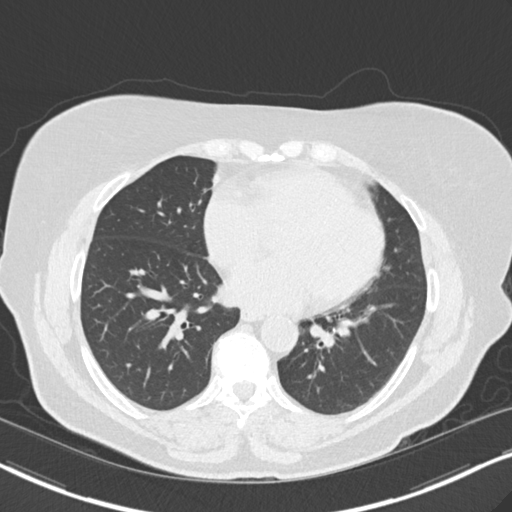
[im 84/151  lung]
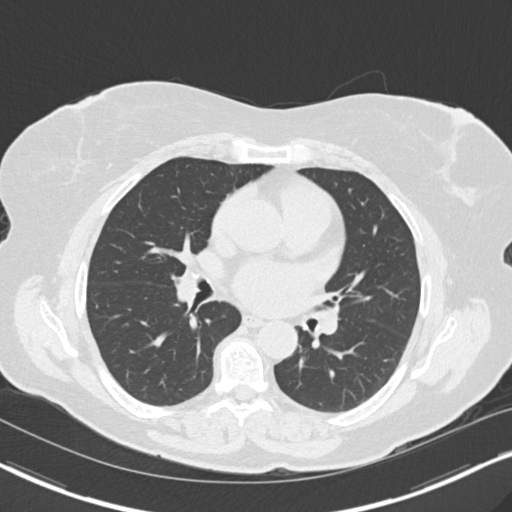
[im 95/151  lung]
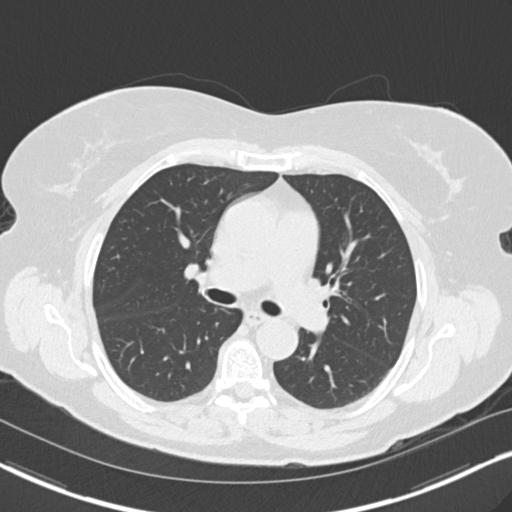
[im 106/151  mediastinal]
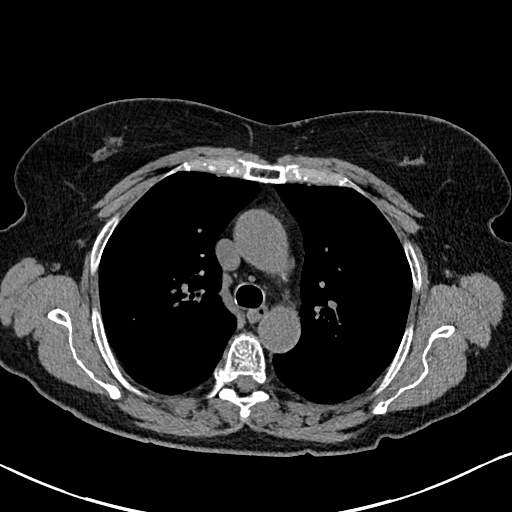
[im 106/151  lung]
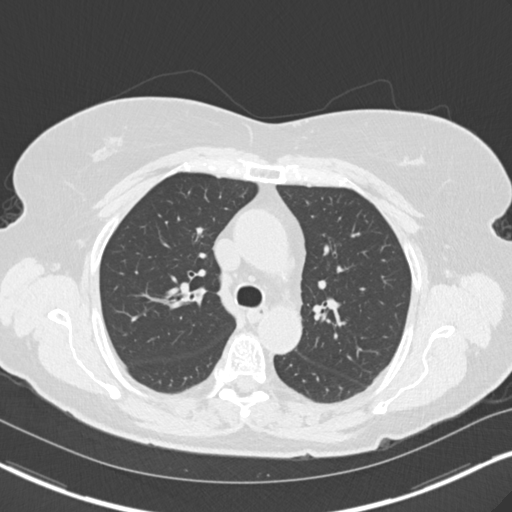
[im 117/151  lung]
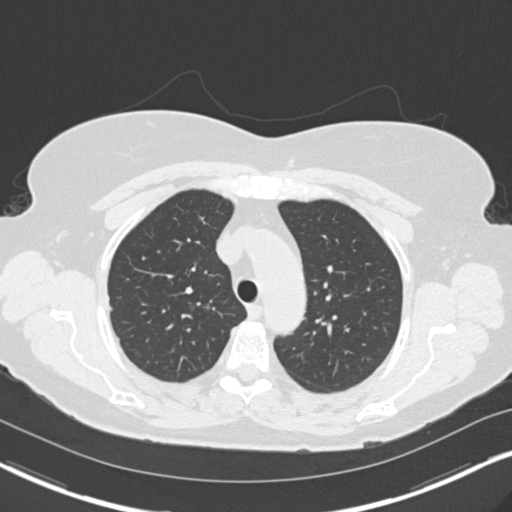
[im 128/151  lung]
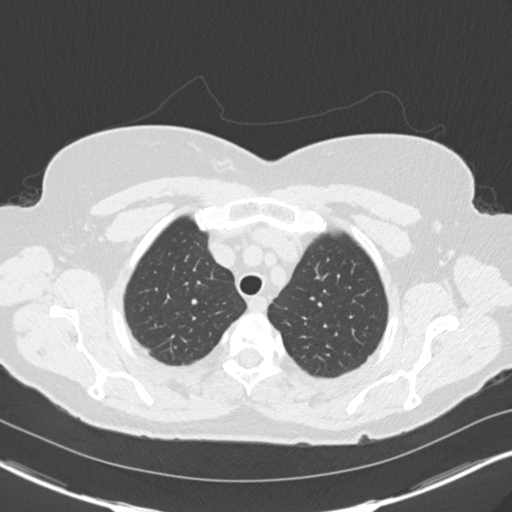
[im 139/151  lung]
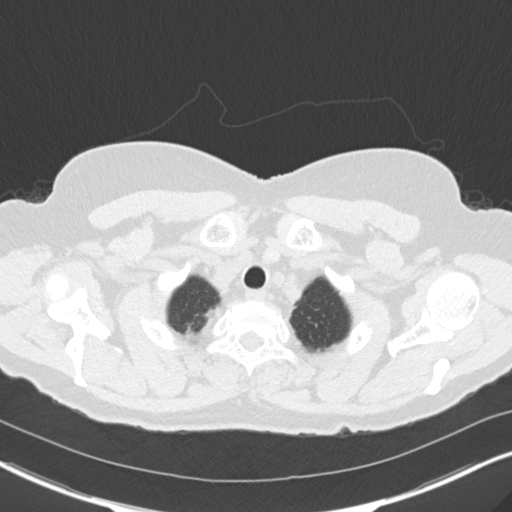

[Series 5: coronal · coronal · 0.62mm/px · 3 of 132 slices shown]
[im 27/132  lung]
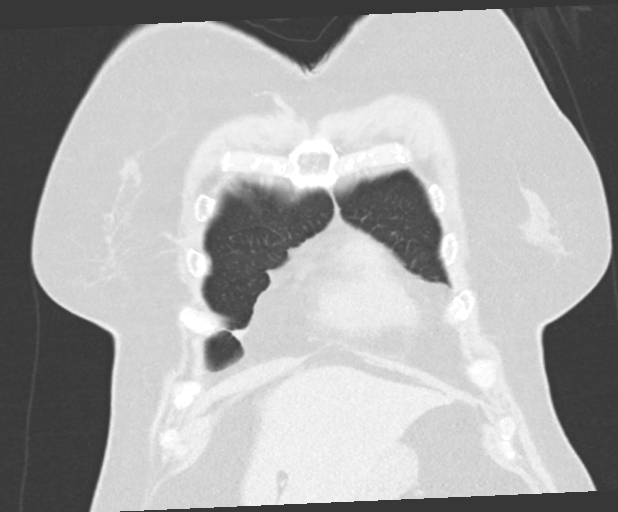
[im 53/132  lung]
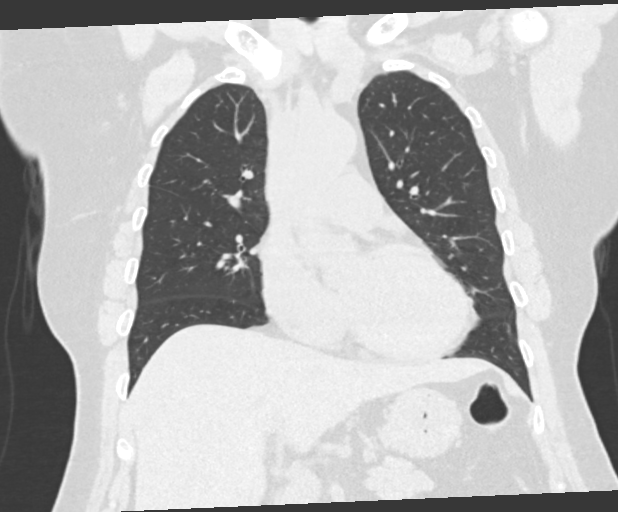
[im 79/132  lung]
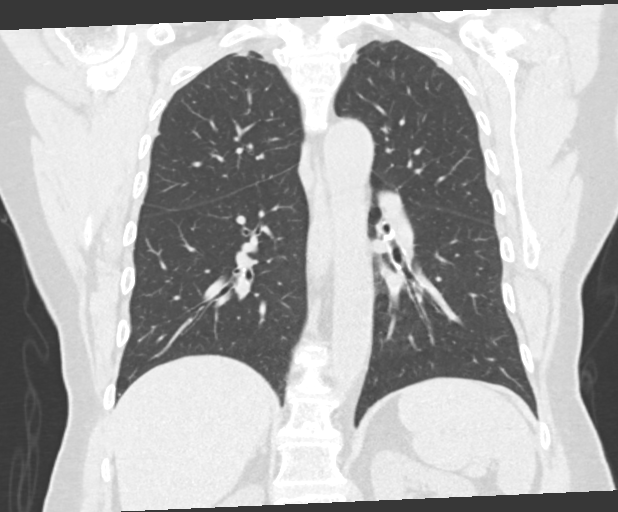

[15 of 36 positions shown; findings below may reference images not displayed]

FINDINGS: Cardiovascular: The heart size is normal. No pericardial fluid
identified. The thoracic aorta is normal in caliber. No calcified
plaque visualized in the distribution of the coronary arteries.
Central pulmonary arteries are normal in caliber.

Mediastinum/Nodes: No enlarged mediastinal or axillary lymph nodes.
Thyroid gland, trachea, and esophagus demonstrate no significant
findings.

Lungs/Pleura: Mild scarring is noted in the inferior aspects of the
lingula and right middle lobe anteriorly. No evidence of pulmonary
consolidation, edema, nodule or pneumothorax. No pleural fluid
identified. No evidence of airway obstruction, bronchiectasis or
significant underlying chronic lung disease.

Upper Abdomen: No acute abnormality.

Musculoskeletal: Bony structures show no evidence of fracture or
lesion. No chest wall mass or hematoma identified.
IMPRESSION: No active disease in the chest. Mild scarring in the inferior
lingula and right middle lobe.

## 2017-05-18 IMAGING — CR DG CHEST 2V
2 series · 2 of 2 positions shown · non-contrast
Comparison: [DATE] and earlier.

CLINICAL DATA: 66-year-old female with right rib pain onset today.
Cough for 2 months.

EXAM:
CHEST  2 VIEW

[chest pa]
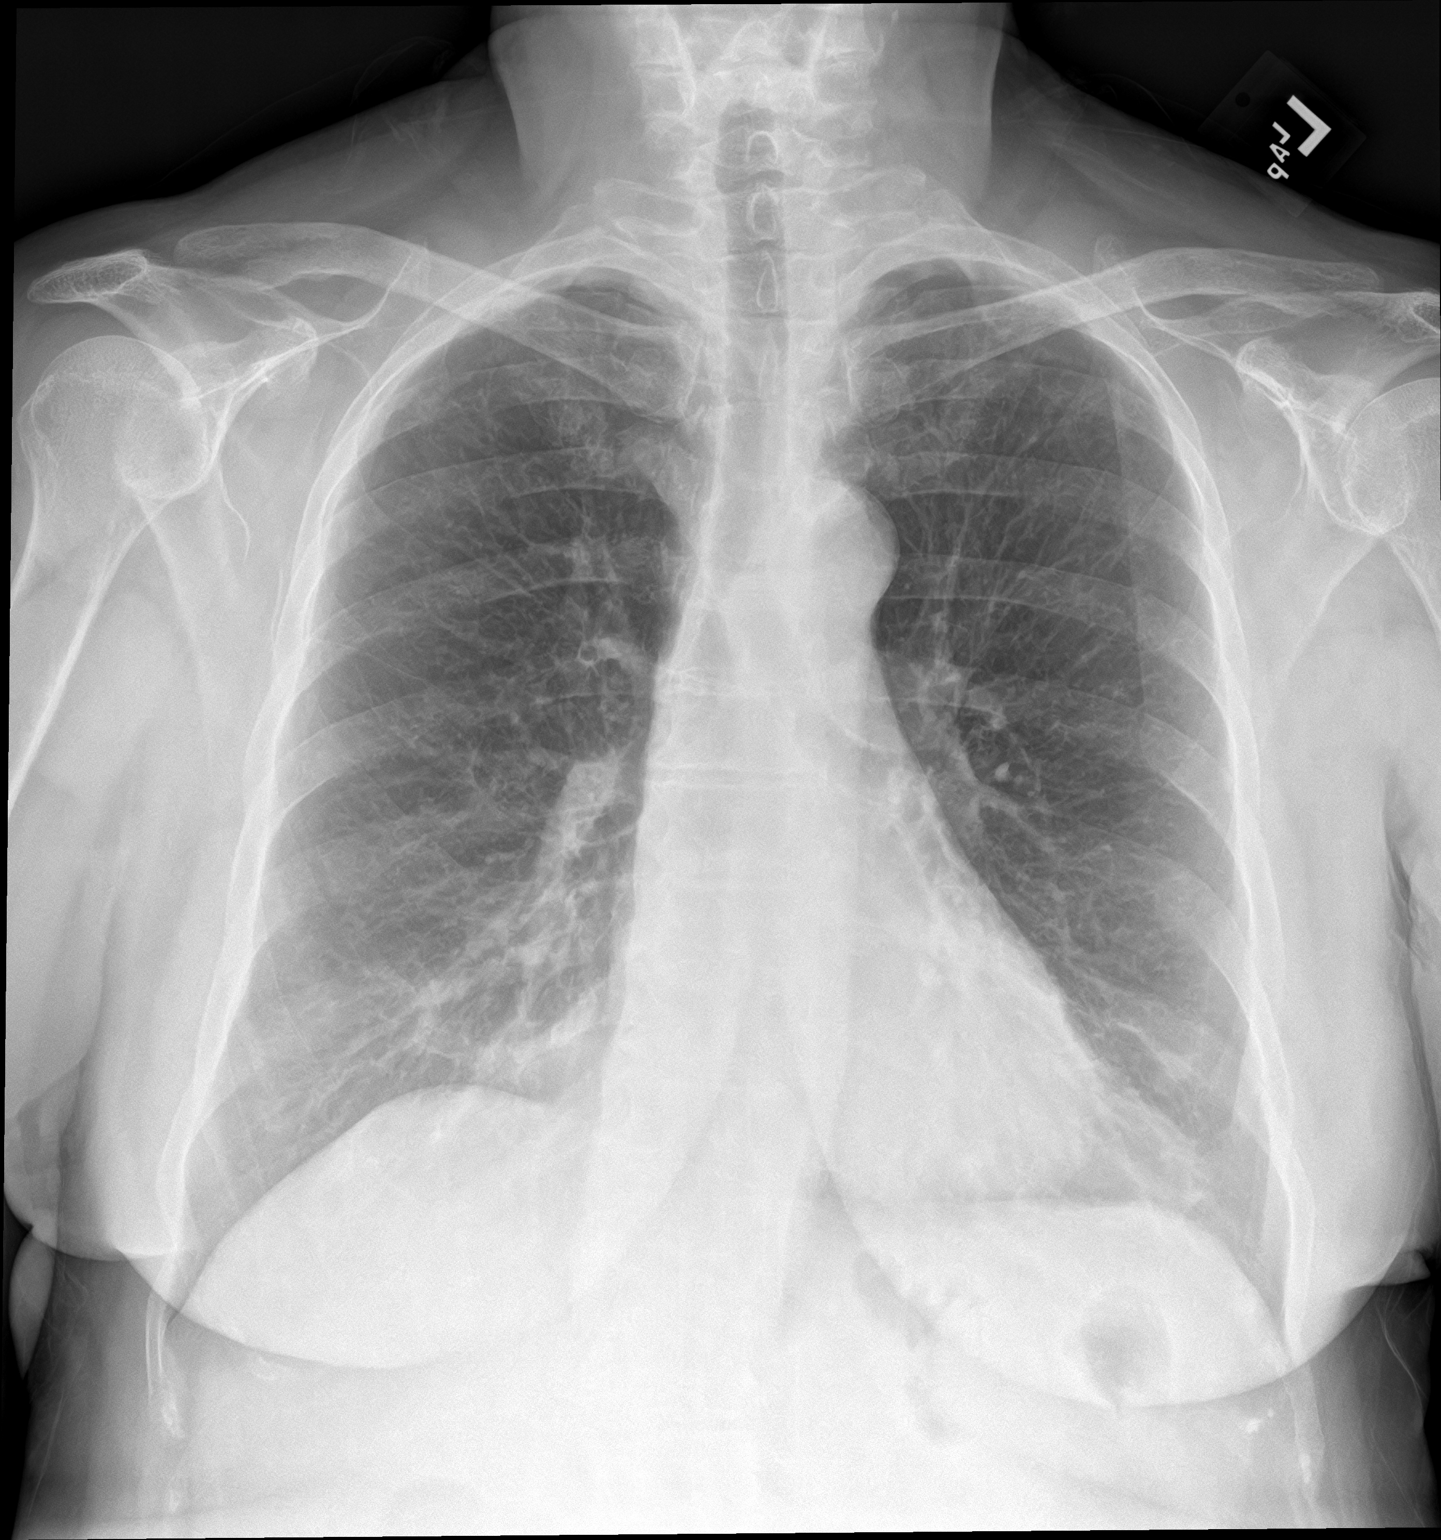

[chest lat]
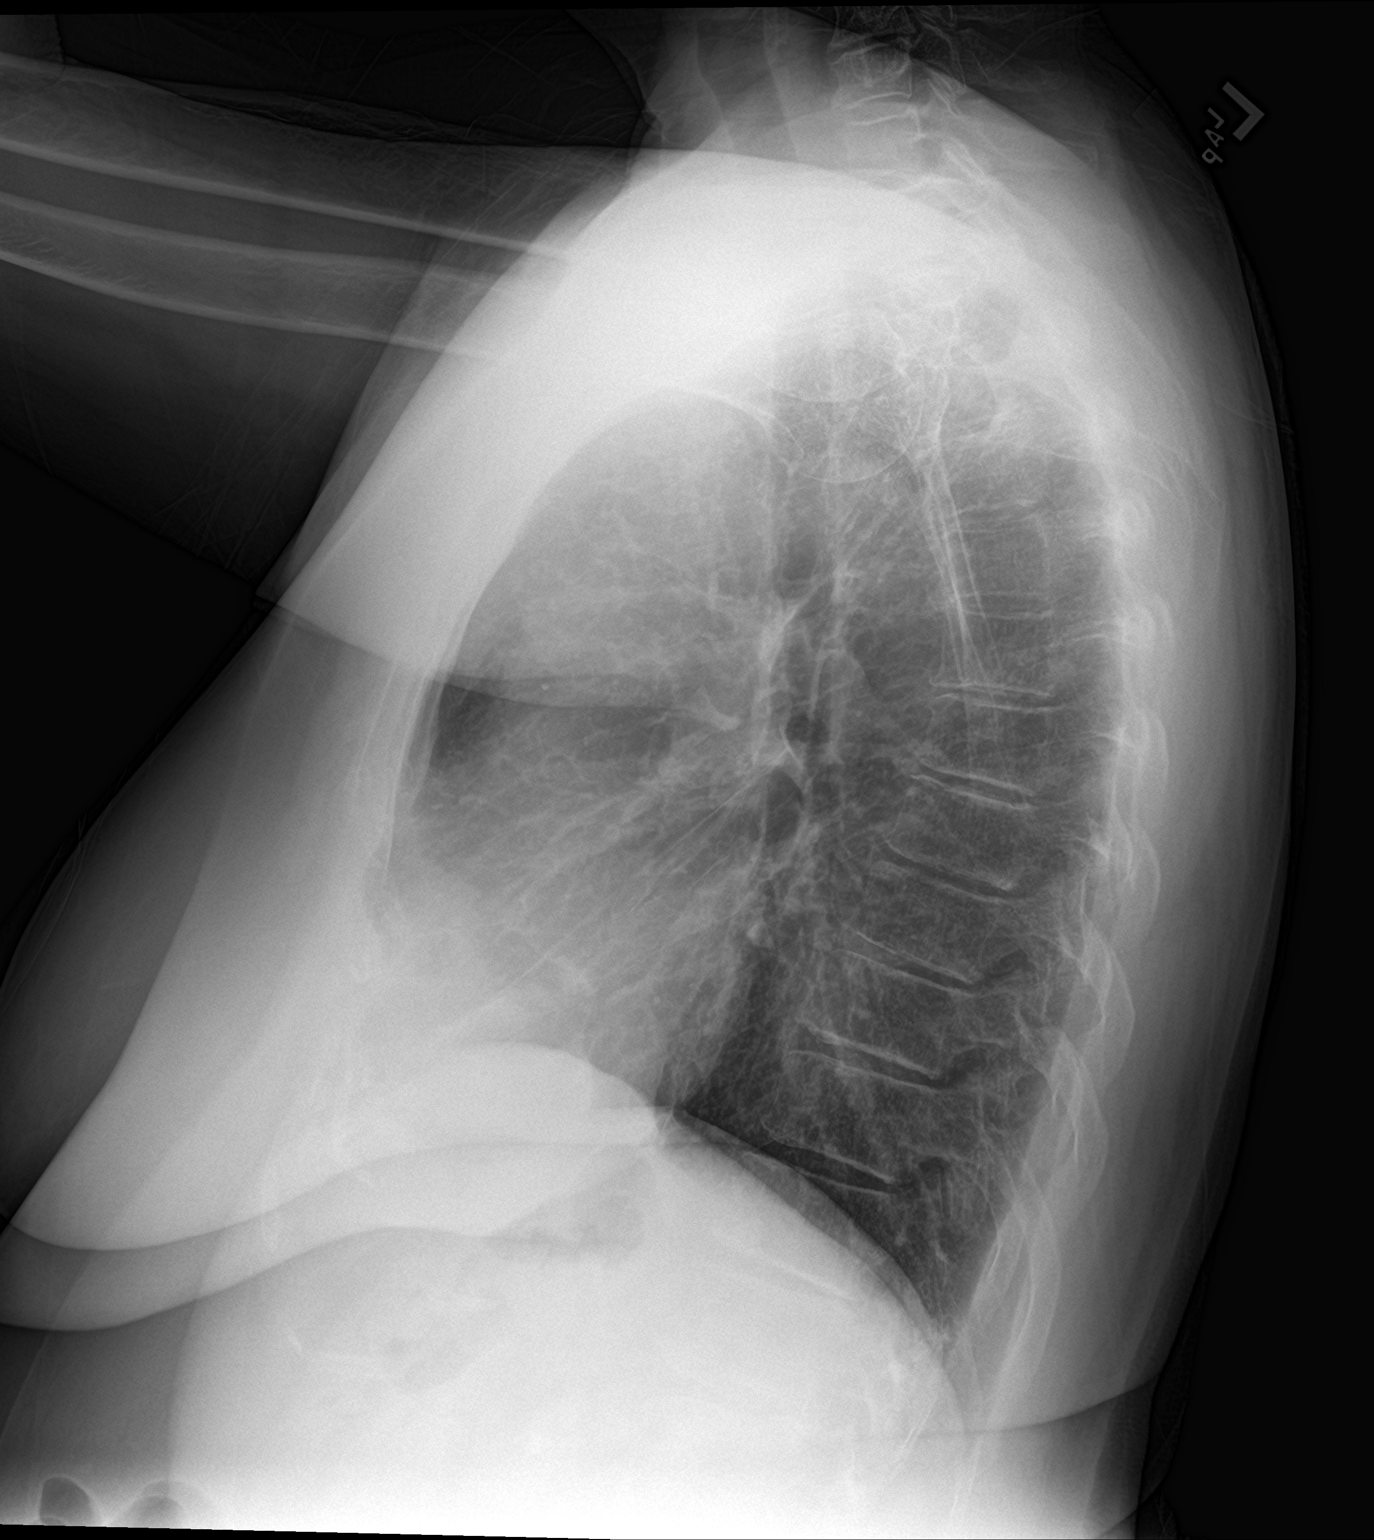

[2 of 2 positions shown; findings below may reference images not displayed]

FINDINGS: Mildly increased streaky peribronchial opacity at both lung bases on
the frontal view, not well correlated on the lateral and could be in
the middle lobes. No pleural effusion or other confluent pulmonary
opacity. Stable lung volumes. No pneumothorax or pulmonary edema.
Mediastinal contours remain normal. Visualized tracheal air column
is within normal limits. No displaced right rib fracture identified.
No acute osseous abnormality identified. Negative visible bowel gas
pattern.
IMPRESSION: 1. Increased streaky peribronchial opacity at both lung bases since
[REDACTED] appears to affect the bilateral middle lobes. Consider
atypical pulmonary infection such as REYEA, and noncontrast Chest CT
evaluation may be valuable.
2. No pleural effusion or other acute cardiopulmonary abnormality.
3.  No displaced right rib fracture identified.

## 2017-05-18 MED ORDER — FAMOTIDINE 20 MG PO TABS: 20.0000 mg | ORAL_TABLET | Freq: Two times a day (BID) | ORAL | 1 refills | Status: DC

## 2017-05-18 MED ORDER — TRAMADOL HCL 50 MG PO TABS: 50.0000 mg | ORAL_TABLET | Freq: Four times a day (QID) | ORAL | 0 refills | Status: AC | PRN

## 2017-05-18 NOTE — ED Notes (Signed)
See triage note  Presents with pain to right lateral rib area   States pain started this am when she woke up   Denies any n/v, fever or trauma  States she has had occasional non prod cough

## 2017-05-18 NOTE — ED Provider Notes (Signed)
[facility] Emergency Department Provider Note  ____________________________________________  Time seen: Approximately 6:10 PM  I have reviewed the triage vital signs and the nursing notes.   HISTORY  Chief Complaint Flank Pain (Right side ribs)    HPI [name] is a 66 y.o. female presents to the emergency department with nonproductive cough and right lateral rib pain.  Rib pain has occurred for the past 2 days.  Nonproductive cough has occurred for the past 103-month.  Patient reports that she has sought care with her primary care provider regarding cough and was diagnosed with seasonal allergies.  Patient reports that cough has not improved despite allergy medication.  Patient has a history of GERD and reports that cough is worse after eating and with a supine position.  She is currently only taking a PPI for GERD.  She denies chest pain, chest tightness, shortness of breath, nausea, vomiting or abdominal pain.  No alleviating measures of been attempted.   Past Medical History:  Diagnosis Date  . Allergic rhinitis   . Arthritis   . GERD (gastroesophageal reflux disease)   . Headache(784.0)    Migraines  . History of adenomatous polyp of colon   . History of concussion    AS CHILD--  NO RESIDUAL  . History of palpitations   . Hypertension   . Paget's disease of vulva   . PONV (postoperative nausea and vomiting)    SEVERE  . Wears glasses     Patient Active Problem List   Diagnosis Date Noted  . Post-nasal drip 07/05/2016  . Persistent cough for 3 weeks or longer 08/05/2015  . Reactive airway disease 04/24/2013  . Paget's disease of vulva 05/01/2012  . COLONIC POLYPS, ADENOMATOUS, HX OF 12/11/2008  . Migraine without aura 10/10/2008  . Essential hypertension, benign 10/10/2008  . Allergic rhinitis 10/10/2008  . GERD 10/10/2008  . PERIMENOPAUSAL SYNDROME 10/10/2008  . OSTEOARTHRITIS 10/10/2008  . DEGENERATIVE DISC DISEASE, CERVICAL SPINE  10/10/2008    Past Surgical History:  Procedure Laterality Date  . mucoid cyst removal thumb    . 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 PULLEY RELEASE RIGHT THUMB  03-23-2011  . SIMPLE VULVECTOMY  03-24-2010   right side  . TONSILLECTOMY  1977  . TRIGGER FINGER RELEASE    . VAGINAL HYSTERECTOMY  1989   and Anterior and posterior repair's for prolapse  . VULVECTOMY Right 01/10/2015   Procedure: RIGHT WIDE LOCAL EXCISION VULVECTOMY;  Surgeon: DMarti Sleigh MD;  [facility]  Service: Gynecology;  Laterality: Right;  .Marland KitchenVULVECTOMY Right 04/10/2015   Procedure: WIDE LOCAL EXCISION VULVA;  Surgeon: DMarti Sleigh MD;  [facility]  Service: Gynecology;  Laterality: Right;  . WISDOM TOOTH EXTRACTION      Prior to Admission medications   Medication Sig Start Date End Date Taking? Authorizing Provider  albuterol (PROVENTIL HFA;VENTOLIN HFA) 108 (90 Base) MCG/ACT inhaler Inhale 2 puffs into the lungs every 6 (six) hours as needed. Reported on 10/24/2015 09/17/16   PKennith Gain MD  azelastine (ASTELIN) 0.1 % nasal spray Place 1 spray into both nostrils 2 (two) times daily. Use in each nostril as directed 09/17/16   PKennith Gain MD  doxylamine, Sleep, (UNISOM) 25 MG tablet Take 25 mg by mouth at bedtime as needed.    [provider]  famotidine (PEPCID) 20 MG tablet Take 1  tablet (20 mg total) by mouth 2 (two) times daily. 05/18/17 05/18/18  Lannie Fields, PA-C  fluticasone (FLONASE) 50 MCG/ACT nasal spray Place 2 sprays into the nose daily as needed.     [provider]  HYDROcodone-homatropine (HYCODAN) 5-1.5 MG/5ML syrup Take 5 mLs by mouth every 8 (eight) hours as needed for cough. 04/02/17   Leone Haven, MD  lansoprazole (PREVACID) 30 MG capsule Take 30 mg by mouth 2 (two) times daily before a  meal.  07/26/15   [provider]  levocetirizine (XYZAL) 5 MG tablet Take 5 mg by mouth every evening.    [provider]  losartan-hydrochlorothiazide (HYZAAR) 100-25 MG tablet TAKE 1 TABLET DAILY IN THE MORNING 12/07/16   Bedsole, Amy E, MD  metoprolol tartrate (LOPRESSOR) 25 MG tablet TAKE ONE-FOURTH (1/4) TABLET (6.25 MG) TWICE A DAY 05/18/17   Bedsole, Amy E, MD  montelukast (SINGULAIR) 10 MG tablet TAKE 1 TABLET EVERY MORNING 06/17/16   Bedsole, Amy E, MD  traMADol (ULTRAM) 50 MG tablet Take 1 tablet (50 mg total) by mouth every 6 (six) hours as needed for up to 3 days. 05/18/17 05/21/17  Lannie Fields, PA-C  Triamcinolone & Emollient (DERMASORB TA) 0.1 % KIT  02/11/16   [provider]  triamcinolone ointment (KENALOG) 0.5 % Apply 1 application topically 2 (two) times daily. 05/04/16   [name] D, NP  VENTOLIN HFA 108 (90 Base) MCG/ACT inhaler USE 2 INHALATIONS EVERY 6 HOURS AS NEEDED 03/01/17   Kennith Gain, MD    Allergies Ibuprofen; Naprosyn [naproxen]; Other; Azithromycin; and Sulfa antibiotics  Family History  Problem Relation Age of Onset  . Cancer Maternal Grandmother   . Hypertension Father   . Heart disease Father   . Asthma Father   . Hypertension Mother   . Heart disease Mother   . Breast cancer Paternal Grandmother 75  . Colon cancer Paternal Uncle   . Allergic rhinitis Neg Hx   . Angioedema Neg Hx   . Eczema Neg Hx   . Immunodeficiency Neg Hx   . Urticaria Neg Hx     Social History Social History   Tobacco Use  . Smoking status: Former Smoker    Years: 5.00    Types: Cigarettes    Last attempt to quit: 04/15/1989    Years since quitting: 28.1  . Smokeless tobacco: Never Used  Substance Use Topics  . Alcohol use: Yes    Alcohol/week: 0.6 oz    Types: 1 Glasses of wine per week    Comment: occasional wine  . Drug use: No     Review of Systems  Constitutional: No fever/chills Eyes: No visual changes. No  discharge ENT: No upper respiratory complaints. Cardiovascular: no chest pain. Respiratory: Patient has non-productive cough.  Gastrointestinal: No abdominal pain.  No nausea, no vomiting.  No diarrhea.  No constipation. Musculoskeletal: Patient has right lateral rib pain.  Skin: Negative for rash, abrasions, lacerations, ecchymosis. Neurological: Negative for headaches, focal weakness or numbness.   ____________________________________________   PHYSICAL EXAM:  VITAL SIGNS: ED Triage Vitals  Enc Vitals Group     BP 05/18/17 1532 126/67     Pulse Rate 05/18/17 1532 75     Resp 05/18/17 1532 16     Temp 05/18/17 1532 98.3 F (36.8 C)     Temp Source 05/18/17 1532 Oral     SpO2 05/18/17 1532 99 %     Weight 05/18/17 1531 183 lb (83 kg)  Height 05/18/17 1531 '5\' 4"'$  (1.626 m)     Head Circumference --      Peak Flow --      Pain Score 05/18/17 1530 6     Pain Loc --      Pain Edu? --      Excl. in [place]? --      Constitutional: Alert and oriented. Well appearing and in no acute distress. Eyes: Conjunctivae are normal. PERRL. EOMI. Head: Atraumatic. ENT:      Ears: TMs are pearly bilaterally.       Nose: No congestion/rhinnorhea.      Mouth/Throat: Mucous membranes are moist.  Neck: FROM.  Hematological/Lymphatic/Immunilogical: No cervical lymphadenopathy. Cardiovascular: Normal rate, regular rhythm. Normal S1 and S2.  Good peripheral circulation. Respiratory: Normal respiratory effort without tachypnea or retractions. Lungs CTAB. Good air entry to the bases with no decreased or absent breath sounds. Gastrointestinal: Bowel sounds 4 quadrants. Soft and nontender to palpation. No guarding or rigidity. No palpable masses. No distention. No CVA tenderness. Musculoskeletal: Full range of motion to all extremities. No gross deformities appreciated. Neurologic:  Normal speech and language. No gross focal neurologic deficits are appreciated.  Skin:  Skin is warm, dry and intact.  No rash noted. Psychiatric: Mood and affect are normal. Speech and behavior are normal. Patient exhibits appropriate insight and judgement.   ____________________________________________   LABS (all labs ordered are listed, but only abnormal results are displayed)  Labs Reviewed - No data to display ____________________________________________  EKG   ____________________________________________  RADIOLOGY Unk Pinto, personally viewed and evaluated these images (plain radiographs) as part of my medical decision making, as well as reviewing the written report by the radiologist.  Dg Chest 2 View  Result Date: 05/18/2017 CLINICAL DATA:  66 year old female with right rib pain onset today. Cough for 2 months. EXAM: CHEST  2 VIEW COMPARISON:  06/21/2016 and earlier. FINDINGS: Mildly increased streaky peribronchial opacity at both lung bases on the frontal view, not well correlated on the lateral and could be in the middle lobes. No pleural effusion or other confluent pulmonary opacity. Stable lung volumes. No pneumothorax or pulmonary edema. Mediastinal contours remain normal. Visualized tracheal air column is within normal limits. No displaced right rib fracture identified. No acute osseous abnormality identified. Negative visible bowel gas pattern. IMPRESSION: 1. Increased streaky peribronchial opacity at both lung bases since January appears to affect the bilateral middle lobes. Consider atypical pulmonary infection such as MAI, and noncontrast Chest CT evaluation may be valuable. 2. No pleural effusion or other acute cardiopulmonary abnormality. 3.  No displaced right rib fracture identified. Electronically Signed   By: Genevie Ann M.D.   On: 05/18/2017 16:21   Ct Chest Wo Contrast  Result Date: 05/18/2017 CLINICAL DATA:  Right lateral rib pain and cough. Abnormal chest x-ray. EXAM: CT CHEST WITHOUT CONTRAST TECHNIQUE: Multidetector CT imaging of the chest was performed following the  standard protocol without IV contrast. COMPARISON:  Chest x-ray earlier today. FINDINGS: Cardiovascular: The heart size is normal. No pericardial fluid identified. The thoracic aorta is normal in caliber. No calcified plaque visualized in the distribution of the coronary arteries. Central pulmonary arteries are normal in caliber. Mediastinum/Nodes: No enlarged mediastinal or axillary lymph nodes. Thyroid gland, trachea, and esophagus demonstrate no significant findings. Lungs/Pleura: Mild scarring is noted in the inferior aspects of the lingula and right middle lobe anteriorly. No evidence of pulmonary consolidation, edema, nodule or pneumothorax. No pleural fluid identified. No evidence of airway  obstruction, bronchiectasis or significant underlying chronic lung disease. Upper Abdomen: No acute abnormality. Musculoskeletal: Bony structures show no evidence of fracture or lesion. No chest wall mass or hematoma identified. IMPRESSION: No active disease in the chest. Mild scarring in the inferior lingula and right middle lobe. Electronically Signed   By: Aletta Edouard M.D.   On: 05/18/2017 17:34    ____________________________________________    PROCEDURES  Procedure(s) performed:    Procedures    Medications - No data to display   ____________________________________________   INITIAL IMPRESSION / ASSESSMENT AND PLAN / ED COURSE  Pertinent labs & imaging results that were available during my care of the patient were reviewed by me and considered in my medical decision making (see chart for details).  Review of the [place] CSRS was performed in accordance of the [facility] prior to dispensing any controlled drugs.     Assessment and plan Cough Right lateral rib pain Patient presents to the emergency department with persistent cough for the past 2 months.  A chest x-ray was conducted in the emergency department which revealed streaky peribronchial opacities that were concerning enough that CT  chest without contrast was recommended.  CT chest was conducted in the emergency department which revealed lung scarring but no other concerning findings.  An H2 blocker was added to patient's GERD management.  A short course of tramadol was given for right lateral rib pain as patient cannot tolerate anti-inflammatories.  Vital signs are reassuring prior to discharge.  All patient questions were answered.     ____________________________________________  FINAL CLINICAL IMPRESSION(S) / ED DIAGNOSES  Final diagnoses:  Rib pain on right side  Cough      NEW MEDICATIONS STARTED DURING THIS VISIT:  ED Discharge Orders        Ordered    famotidine (PEPCID) 20 MG tablet  2 times daily     05/18/17 1758    traMADol (ULTRAM) 50 MG tablet  Every 6 hours PRN     05/18/17 1758          This chart was dictated using voice recognition software/Dragon. Despite best efforts to proofread, errors can occur which can change the meaning. Any change was purely unintentional.    Lannie Fields, PA-C 05/18/17 1820    Merlyn Lot, MD 05/18/17 2023

## 2017-05-18 NOTE — ED Triage Notes (Signed)
Patient reports she woke up this morning with R sided rib pain. Hurts with any position. Denies chest pain or SOB. Denies urinary symptoms. Reports Chronic lower back pain. Ambulatory with no problems. A&O x4

## 2017-05-18 NOTE — ED Notes (Signed)
Pt taken to CT via wheelchair.

## 2017-05-18 NOTE — Telephone Encounter (Signed)
  Reason for Disposition . [1] MODERATE pain (e.g., interferes with normal activities) AND [2] comes and goes (cramps) AND [3] present > 24 hours  (Exception: pain with Vomiting or Diarrhea - see that Guideline)  Answer Assessment - Initial Assessment Questions 1. LOCATION: "Where does it hurt?"      Under rib cage right side 2. RADIATION: "Does the pain shoot anywhere else?" (e.g., chest, back)     Mid back 3. ONSET: "When did the pain begin?" (e.g., minutes, hours or days ago)      today 4. SUDDEN: "Gradual or sudden onset?"     sudden 5. PATTERN "Does the pain come and go, or is it constant?"    - If constant: "Is it getting better, staying the same, or worsening?"      (Note: Constant means the pain never goes away completely; most serious pain is constant and it progresses)     - If intermittent: "How long does it last?" "Do you have pain now?"     (Note: Intermittent means the pain goes away completely between bouts)    Constant pain 6. SEVERITY: "How bad is the pain?"  (e.g., Scale 1-10; mild, moderate, or severe)    - MILD (1-3): doesn't interfere with normal activities, abdomen soft and not tender to touch     - MODERATE (4-7): interferes with normal activities or awakens from sleep, tender to touch     - SEVERE (8-10): excruciating pain, doubled over, unable to do any normal activities       6 feels pain no matter how she moves 7. RECURRENT SYMPTOM: "Have you ever had this type of abdominal pain before?" If so, ask: "When was the last time?" and "What happened that time?"      no 8. AGGRAVATING FACTORS: "Does anything seem to cause this pain?" (e.g., foods, stress, alcohol)     Any movement 9. CARDIAC SYMPTOMS: "Do you have any of the following symptoms: chest pain, difficulty breathing, sweating, nausea?"     no 10. OTHER SYMPTOMS: "Do you have any other symptoms?" (e.g., fever, vomiting, diarrhea)    no 11. PREGNANCY: "Is there any chance you are pregnant?" "When was your  last menstrual period?"       no  Protocols used: ABDOMINAL PAIN - UPPER-A-AH

## 2017-05-19 ENCOUNTER — Encounter: Payer: Self-pay | Admitting: Family Medicine

## 2017-05-19 ENCOUNTER — Ambulatory Visit (INDEPENDENT_AMBULATORY_CARE_PROVIDER_SITE_OTHER): Payer: BLUE CROSS/BLUE SHIELD | Admitting: Family Medicine

## 2017-05-19 DIAGNOSIS — R053 Chronic cough: Secondary | ICD-10-CM

## 2017-05-19 DIAGNOSIS — R05 Cough: Secondary | ICD-10-CM | POA: Diagnosis not present

## 2017-05-19 NOTE — Patient Instructions (Signed)
Keep taking the pepcid and use tramadol for pain if needed.  Gargle with salt water and update [place] as needed.  Take care.  Glad to see you.

## 2017-05-19 NOTE — Progress Notes (Signed)
Cough for 2 months and more recently rib pain.  Clear sputum.  Was seen at the ER yesterday.  No trauma to cause the rib pain.    Imaging done at ER: No active disease in the chest. Mild scarring in the inferior lingula and right middle lobe.   Rib pain and cough are much better after taking tramadol.    She has some oral irritation in the meantime with "bumps" that came up in the mouth.  She has noted toothpaste intolerance- listed and d/w pt.  She had gotten a new flavor in the last month.    Meds, vitals, and allergies reviewed.   ROS: Per HPI unless specifically indicated in ROS section   nad ncat Nasal exam with mild congestion noted.  Mild irritation under the tongue but not on the tongue itself.  No thrush.  No ulceration.   Neck supple, no LA ctab rrr Ext w/o edema Skin well perfused.

## 2017-05-20 ENCOUNTER — Encounter: Payer: Self-pay | Admitting: Allergy

## 2017-05-20 ENCOUNTER — Ambulatory Visit (INDEPENDENT_AMBULATORY_CARE_PROVIDER_SITE_OTHER): Payer: BLUE CROSS/BLUE SHIELD | Admitting: Allergy

## 2017-05-20 ENCOUNTER — Ambulatory Visit: Payer: BLUE CROSS/BLUE SHIELD | Admitting: Allergy

## 2017-05-20 VITALS — BP 128/68 | HR 66 | Resp 18

## 2017-05-20 DIAGNOSIS — H1010 Acute atopic conjunctivitis, unspecified eye: Secondary | ICD-10-CM | POA: Diagnosis not present

## 2017-05-20 DIAGNOSIS — J309 Allergic rhinitis, unspecified: Secondary | ICD-10-CM | POA: Diagnosis not present

## 2017-05-20 DIAGNOSIS — J4520 Mild intermittent asthma, uncomplicated: Secondary | ICD-10-CM | POA: Diagnosis not present

## 2017-05-20 DIAGNOSIS — K219 Gastro-esophageal reflux disease without esophagitis: Secondary | ICD-10-CM | POA: Diagnosis not present

## 2017-05-20 NOTE — Patient Instructions (Addendum)
Use nasal saline rinse daily in the morning then follow with your nasal sprays  Continue nasal antihistamine Astelin 2 sprays twice a day.   May use Astelin together with Flonase 2 sprays each nostril daily especially if you have components of nasal stuffy/congested and runny/drainage   Continue Xyzal 5mg  daily  Continue Singulair 10mg  daily -- take at bedtime  Use albuterol inhaler 2 puffs every 4-6 hours as needed for cough, wheeze, difficulty breathing, chest tightness.  Monitor frequency of albuterol use.    Start Arnuity 1 puff daily.  Let us know if this is helping to decrease cough and will send in prescription for this.    Continue prevacid and famotidine for reflux control   Follow-up 4 months or sooner if needed

## 2017-05-20 NOTE — Progress Notes (Signed)
Follow-up Note  RE: [name] MRN: [id number] DOB: 12-31-1950 Date of Office Visit: 05/20/2017   History of present illness: [name] is a 66 y.o. female presenting today for sick visit.  She was last seen in the office on February 04, 2017 by myself.  She has history of allergic rhinoconjunctivitis, reflux and asthma versus VCD.  She states about 2 months ago she developed a cough with a productive sputum that is clear.  She also states that she has had wheezing with this cough.  She reports it sounds like violins playing.  She states the cough occurs all throughout the day.  She states she saw her PCP about 2 months ago who per her report told her it was likely due to allergies.  The cough did not improve despite taking her allergy medications.  She then developed right-sided rib pain that she went to the ER for on May 18, 2017.  She had a chest x-ray done in the ED that showed some abnormalities that she had a chest CT without contrast performed (see report below).  It was thought in the ER that cough and rib pain was lkely related to GERD and pepcid was added to her PPI.  She was also given tramadol for pain relief.  She does feel that the addition of pepcid and tramadol has helped subside the rib pain and cough is somewhat improved but still occurring with wheeze.  She then saw her PCP yesterday as she had developed a bumpy rash inside her mouth on her gums that she states she could roll her tongue over and "wipe off bumps".  She was advised to do salt water gargles which she states has helped as she has not noted any more new oral lesions.  She states she did change flavoring of her toothpaste recently.   She continues to take xyzal, singulair and her nasal sprays but states she has backed off her nasal sprays as she has one 1 bloody nose.  She has not been doing any nasal saline rinse.  She also reports for the cough she has not used her albuterol either.  She has been on ICS in the  remote past.       Review of systems: Review of Systems  Constitutional: Negative for chills, fever and malaise/fatigue.  HENT: Positive for congestion and nosebleeds. Negative for ear discharge, ear pain, sinus pain, sore throat and tinnitus.   Eyes: Negative for pain, discharge and redness.  Respiratory: Positive for cough, sputum production and wheezing. Negative for hemoptysis and shortness of breath.   Cardiovascular: Positive for chest pain (rib pain).  Gastrointestinal: Positive for heartburn. Negative for abdominal pain, constipation, diarrhea, nausea and vomiting.  Musculoskeletal: Negative for joint pain and myalgias.  Skin: Negative for itching and rash.  Neurological: Negative for dizziness and headaches.    All other systems negative unless noted above in HPI  Past medical/social/surgical/family history have been reviewed and are unchanged unless specifically indicated below.  No changes  Medication List: Allergies as of 05/20/2017      Reactions   Ibuprofen Hypertension   Naprosyn [naproxen] Nausea Only   Other    Allergic to toothpaste- makes mouth peel Hay fever/dust mites/trees "365 allergic"    Azithromycin Itching, Rash   Sulfa Antibiotics Hives, Rash      Medication List        Accurate as of 05/20/17 11:32 AM. Always use your most recent med list.  albuterol 108 (90 Base) MCG/ACT inhaler Commonly known as:  PROVENTIL HFA;VENTOLIN HFA Inhale 2 puffs into the lungs every 6 (six) hours as needed. Reported on 10/24/2015   azelastine 0.1 % nasal spray Commonly known as:  ASTELIN Place 1 spray into both nostrils 2 (two) times daily. Use in each nostril as directed   DERMASORB TA 0.1 % Kit   famotidine 20 MG tablet Commonly known as:  PEPCID Take 1 tablet (20 mg total) by mouth 2 (two) times daily.   fluticasone 50 MCG/ACT nasal spray Commonly known as:  FLONASE Place 2 sprays into the nose daily as needed.   HYDROMET 5-1.5 MG/5ML  syrup Generic drug:  HYDROcodone-homatropine take 5 milliliters by mouth every 8 hours if needed for cough   lansoprazole 30 MG capsule Commonly known as:  PREVACID Take 30 mg by mouth 2 (two) times daily before a meal.   losartan-hydrochlorothiazide 100-25 MG tablet Commonly known as:  HYZAAR TAKE 1 TABLET DAILY IN THE MORNING   metoprolol tartrate 25 MG tablet Commonly known as:  LOPRESSOR TAKE ONE-FOURTH (1/4) TABLET (6.25 MG) TWICE A DAY   montelukast 10 MG tablet Commonly known as:  SINGULAIR TAKE 1 TABLET EVERY MORNING   traMADol 50 MG tablet Commonly known as:  ULTRAM Take 1 tablet (50 mg total) by mouth every 6 (six) hours as needed for up to 3 days.   triamcinolone ointment 0.5 % Commonly known as:  KENALOG Apply 1 application topically 2 (two) times daily.   UNISOM 25 MG tablet Generic drug:  doxylamine (Sleep) Take 25 mg by mouth at bedtime as needed.   XYZAL 5 MG tablet Generic drug:  levocetirizine Take 5 mg by mouth every evening.       Known medication allergies: Allergies  Allergen Reactions  . Ibuprofen Hypertension  . Naprosyn [Naproxen] Nausea Only  . Other     Allergic to toothpaste- makes mouth peel Hay fever/dust mites/trees "365 allergic"   . Azithromycin Itching and Rash  . Sulfa Antibiotics Hives and Rash     Physical examination: Blood pressure 128/68, pulse 66, resp. rate 18, SpO2 93 %.  General: Alert, interactive, in no acute distress. HEENT: PERRLA, TMs pearly gray, turbinates minimally edematous without discharge, post-pharynx non erythematous. Neck: Supple without lymphadenopathy. Lungs: Clear to auscultation without wheezing, rhonchi or rales. {no increased work of breathing. CV: Normal S1, S2 without murmurs. Abdomen: Nondistended, nontender. Skin: Warm and dry, without lesions or rashes. Extremities:  No clubbing, cyanosis or edema. Neuro:   Grossly intact.  Diagnositics/Labs: Labs/imaging:  personally reviewed.    CXR  IMPRESSION: 1. Increased streaky peribronchial opacity at both lung bases since January appears to affect the bilateral middle lobes. Consider atypical pulmonary infection such as MAI, and noncontrast Chest CT evaluation may be valuable. 2. No pleural effusion or other acute cardiopulmonary abnormality. 3.  No displaced right rib fracture identified.   CT chest wo contrast IMPRESSION: No active disease in the chest. Mild scarring in the inferior lingula and right middle lobe.   Spirometry: FEV1: 2.37L  100%, FVC: 2.61L 86%, ratio consistent with nonobstructive pattern ACT 23 Assessment and plan:   Mild intermittent asthma     - persistent cough with wheezing for past 2 months with no active pulmonary disease.      - Start Arnuity 1 puff daily.  Let us know if this is helping to decrease cough and will send in prescription for this.      - have access to  albuterol inhaler 2 puffs every 4-6 hours as needed for cough/wheeze/shortness of breath/chest tightness.  May use 15-20 minutes prior to activity.   Monitor frequency of use.      - continue Singulair 51m at bedtime Asthma control goals:   Full participation in all desired activities (may need albuterol before activity)  Albuterol use two time or less a week on average (not counting use with activity)  Cough interfering with sleep two time or less a month  Oral steroids no more than once a year  No hospitalizations  Allergic rhinoconjunctivitis    - Use nasal saline rinse daily in the morning then follow with your nasal sprays    - Continue nasal antihistamine Astelin 2 sprays twice a day.     - May use Astelin together with Flonase 2 sprays each nostril daily especially if you have components of nasal stuffy/congested and runny/drainage     - Continue Xyzal 570mdaily  GERD    - continue prevacid + famotidine   Follow-up 4 months or sooner if needed  I appreciate the opportunity to take part in Ermagene's care. Please do  not hesitate to contact me with questions.  Sincerely,   ShPrudy FeelerMD Allergy/Immunology [facility]

## 2017-05-22 NOTE — Assessment & Plan Note (Signed)
Improved today, d/w pt about options.   Would keep taking the pepcid and use tramadol for pain if needed, per prev recs Gargle with salt water and update [place] as needed.  She agrees.

## 2017-05-30 ENCOUNTER — Other Ambulatory Visit: Payer: Self-pay | Admitting: Allergy

## 2017-06-05 ENCOUNTER — Other Ambulatory Visit: Payer: Self-pay | Admitting: Family Medicine

## 2017-06-13 ENCOUNTER — Other Ambulatory Visit: Payer: Self-pay | Admitting: Family Medicine

## 2017-06-17 ENCOUNTER — Encounter: Payer: Self-pay | Admitting: Family Medicine

## 2017-06-17 ENCOUNTER — Ambulatory Visit (INDEPENDENT_AMBULATORY_CARE_PROVIDER_SITE_OTHER): Payer: BLUE CROSS/BLUE SHIELD | Admitting: Family Medicine

## 2017-06-17 VITALS — BP 118/72 | HR 59 | Temp 98.4°F | Ht 64.0 in | Wt 181.5 lb

## 2017-06-17 DIAGNOSIS — R198 Other specified symptoms and signs involving the digestive system and abdomen: Secondary | ICD-10-CM | POA: Diagnosis not present

## 2017-06-17 DIAGNOSIS — R053 Chronic cough: Secondary | ICD-10-CM

## 2017-06-17 DIAGNOSIS — Z23 Encounter for immunization: Secondary | ICD-10-CM

## 2017-06-17 DIAGNOSIS — R05 Cough: Secondary | ICD-10-CM

## 2017-06-17 MED ORDER — NYSTATIN 100000 UNIT/ML MT SUSP: 5.0000 mL | Freq: Four times a day (QID) | OROMUCOSAL | 1 refills | Status: DC

## 2017-06-17 NOTE — Assessment & Plan Note (Signed)
No otral lesions. ? Thrush? Atypical.  Will treat with nystatin swish and spit.

## 2017-06-17 NOTE — Patient Instructions (Signed)
Start nystatin swish and spit to treat possible oral candida. Call if not improving in 2-3 weeks.

## 2017-06-17 NOTE — Addendum Note (Signed)
Addended by: Carter Kitten on: 06/17/2017 03:55 PM   Modules accepted: Orders

## 2017-06-17 NOTE — Progress Notes (Signed)
   Subjective:    Patient ID: [name], female    DOB: 1950/11/08, 67 y.o.   MRN: [id number]  HPI  67 year old female presents with gritty feeling in mouth and lips swollen and dry.  She has noted fine grit, clear in her mouth ongoing x 04/2018  She has been trying to brush extra. No relief.   She has seen  At ER for rib pain , cough 05/18/2017  12/13: saw Dr. Damita Dunnings .Desma Paganini with salt water. 12/14 allergist: treated with allergy meds as usual. Started on new inhaler Arnuity. She has now quit.  Started on Pepcid.. Cough has improved significantly  No new med prior to grit starting.  Dentist gave her magic mouth wash.. lidocaine  She has sensitive mouth chronically.   Pt gave spit sample .Marland Kitchen Some white fluffy fine particles in saliva.  Review of Systems  Constitutional: Negative for fatigue and fever.  HENT: Negative for congestion.   Eyes: Negative for pain.  Respiratory: Negative for cough and shortness of breath.   Cardiovascular: Negative for chest pain, palpitations and leg swelling.  Gastrointestinal: Negative for abdominal pain.  Genitourinary: Negative for dysuria and vaginal bleeding.  Musculoskeletal: Negative for back pain.  Neurological: Negative for syncope, light-headedness and headaches.  Psychiatric/Behavioral: Negative for dysphoric mood.       Objective:   Physical Exam  Constitutional: Vital signs are normal. She appears well-developed and well-nourished. She is cooperative.  Non-toxic appearance. She does not appear ill. No distress.  HENT:  Head: Normocephalic.  Right Ear: Hearing, tympanic membrane, external ear and ear canal normal. Tympanic membrane is not erythematous, not retracted and not bulging.  Left Ear: Hearing, tympanic membrane, external ear and ear canal normal. Tympanic membrane is not erythematous, not retracted and not bulging.  Nose: No mucosal edema or rhinorrhea. Right sinus exhibits no maxillary sinus tenderness and no frontal  sinus tenderness. Left sinus exhibits no maxillary sinus tenderness and no frontal sinus tenderness.  Mouth/Throat: Uvula is midline, oropharynx is clear and moist and mucous membranes are normal.  Eyes: Conjunctivae, EOM and lids are normal. Pupils are equal, round, and reactive to light. Lids are everted and swept, no foreign bodies found.  Neck: Trachea normal and normal range of motion. Neck supple. Carotid bruit is not present. No thyroid mass and no thyromegaly present.  Cardiovascular: Normal rate, regular rhythm, S1 normal, S2 normal, normal heart sounds, intact distal pulses and normal pulses. Exam reveals no gallop and no friction rub.  No murmur heard. Pulmonary/Chest: Effort normal and breath sounds normal. No tachypnea. No respiratory distress. She has no decreased breath sounds. She has no wheezes. She has no rhonchi. She has no rales.  Abdominal: Soft. Normal appearance and bowel sounds are normal. There is no tenderness.  Neurological: She is alert.  Skin: Skin is warm, dry and intact. No rash noted.  Psychiatric: Her speech is normal and behavior is normal. Judgment and thought content normal. Her mood appears not anxious. Cognition and memory are normal. She does not exhibit a depressed mood.          Assessment & Plan:

## 2017-06-17 NOTE — Assessment & Plan Note (Signed)
Resolved with pepcid.

## 2017-07-25 ENCOUNTER — Other Ambulatory Visit: Payer: Self-pay | Admitting: *Deleted

## 2017-07-25 MED ORDER — FAMOTIDINE 20 MG PO TABS: 20.0000 mg | ORAL_TABLET | Freq: Two times a day (BID) | ORAL | 1 refills | Status: DC

## 2017-07-26 ENCOUNTER — Encounter: Payer: Self-pay | Admitting: *Deleted

## 2017-07-26 ENCOUNTER — Other Ambulatory Visit: Payer: Self-pay | Admitting: *Deleted

## 2017-07-26 MED ORDER — METOPROLOL TARTRATE 25 MG PO TABS: ORAL_TABLET | ORAL | 0 refills | Status: DC

## 2017-07-26 MED ORDER — LOSARTAN POTASSIUM-HCTZ 100-25 MG PO TABS: 1.0000 | ORAL_TABLET | Freq: Every morning | ORAL | 0 refills | Status: DC

## 2017-08-09 ENCOUNTER — Ambulatory Visit (INDEPENDENT_AMBULATORY_CARE_PROVIDER_SITE_OTHER): Payer: BLUE CROSS/BLUE SHIELD | Admitting: Internal Medicine

## 2017-08-09 ENCOUNTER — Encounter: Payer: Self-pay | Admitting: Internal Medicine

## 2017-08-09 VITALS — BP 124/84 | HR 62 | Temp 98.2°F | Wt 186.0 lb

## 2017-08-09 DIAGNOSIS — J0100 Acute maxillary sinusitis, unspecified: Secondary | ICD-10-CM

## 2017-08-09 MED ORDER — HYDROCODONE-HOMATROPINE 5-1.5 MG/5ML PO SYRP: 5.0000 mL | ORAL_SOLUTION | Freq: Three times a day (TID) | ORAL | 0 refills | Status: DC | PRN

## 2017-08-09 MED ORDER — AMOXICILLIN-POT CLAVULANATE 875-125 MG PO TABS: 1.0000 | ORAL_TABLET | Freq: Two times a day (BID) | ORAL | 0 refills | Status: DC

## 2017-08-09 NOTE — Progress Notes (Signed)
HPI  Pt presents to the clinic today with c/o headache, facial pain and pressure, nasal congestion, sore throat and cough. She reports this started 2-3 days ago. She describes the headache as pressure. She is blowing thick yellow/green mucous out of her nose. She denies difficulty swallowing but feels like her glands are swollen. The cough is productive of yellow/green mucous. She denies fever, chills or body aches. She has tried Flonase, antihistamines and cough syrup without much relief. She has a history of allergies and RAD. She has had sick contacts.  Review of Systems     Past Medical History:  Diagnosis Date  . Allergic rhinitis   . Arthritis   . GERD (gastroesophageal reflux disease)   . Headache(784.0)    Migraines  . History of adenomatous polyp of colon   . History of concussion    AS CHILD--  NO RESIDUAL  . History of palpitations   . Hypertension   . Paget's disease of vulva   . PONV (postoperative nausea and vomiting)    SEVERE  . Wears glasses     Family History  Problem Relation Age of Onset  . Cancer Maternal Grandmother   . Hypertension Father   . Heart disease Father   . Asthma Father   . Hypertension Mother   . Heart disease Mother   . Breast cancer Paternal Grandmother 21  . Colon cancer Paternal Uncle   . Allergic rhinitis Neg Hx   . Angioedema Neg Hx   . Eczema Neg Hx   . Immunodeficiency Neg Hx   . Urticaria Neg Hx     Social History   Socioeconomic History  . Marital status: Married    Spouse name: Not on file  . Number of children: Not on file  . Years of education: Not on file  . Highest education level: Not on file  Social Needs  . Financial resource strain: Not on file  . Food insecurity - worry: Not on file  . Food insecurity - inability: Not on file  . Transportation needs - medical: Not on file  . Transportation needs - non-medical: Not on file  Occupational History  . Not on file  Tobacco Use  . Smoking status: Former Smoker     Years: 5.00    Types: Cigarettes    Last attempt to quit: 04/15/1989    Years since quitting: 28.3  . Smokeless tobacco: Never Used  Substance and Sexual Activity  . Alcohol use: Yes    Alcohol/week: 0.6 oz    Types: 1 Glasses of wine per week    Comment: occasional wine  . Drug use: No  . Sexual activity: Not on file  Other Topics Concern  . Not on file  Social History Narrative  . Not on file    Allergies  Allergen Reactions  . Ibuprofen Hypertension  . Naprosyn [Naproxen] Nausea Only  . Other     Allergic to toothpaste- makes mouth peel Hay fever/dust mites/trees "365 allergic"   . Azithromycin Itching and Rash  . Sulfa Antibiotics Hives and Rash     Constitutional: Positive headache. Denies fatigue, fever or abrupt weight changes.  HEENT:  Positive  facial pain, nasal congestion and sore throat. Denies eye redness, ear pain, ringing in the ears, wax buildup, runny nose or bloody nose. Respiratory: Positive cough. Denies difficulty breathing or shortness of breath.  Cardiovascular: Denies chest pain, chest tightness, palpitations or swelling in the hands or feet.   No other specific complaints  in a complete review of systems (except as listed in HPI above).  Objective:   BP 124/84   Pulse 62   Temp 98.2 F (36.8 C) (Oral)   Wt 186 lb (84.4 kg)   SpO2 98%   BMI 31.93 kg/m   General: Appears her stated age, ill appearing, in NAD. HEENT: Head: normal shape and size, maxillary sinus tenderness noted;Ears: Tm's gray and intact, normal light reflex; Nose: mucosa pink and moist, septum midline; Throat/Mouth: + PND. Teeth present, mucosa erythematous and moist, no exudate noted, no lesions or ulcerations noted.  Neck:  No adenopathy noted.   Pulmonary/Chest: Normal effort and positive vesicular breath sounds. No respiratory distress. No wheezes, rales or ronchi noted.       Assessment & Plan:   Acute Maxillary Sinusitis  Can use a Neti Pot which can be  purchased from your local drug store. Flonase 2 sprays each nostril for 3 days and then as needed. eRx for Augmentin BID for 10 days  RTC as needed or if symptoms persist. Webb Silversmith, NP

## 2017-08-09 NOTE — Patient Instructions (Signed)

## 2017-08-10 ENCOUNTER — Other Ambulatory Visit: Payer: Self-pay | Admitting: Gynecologic Oncology

## 2017-08-10 DIAGNOSIS — L292 Pruritus vulvae: Secondary | ICD-10-CM

## 2017-08-10 MED ORDER — CLOBETASOL PROPIONATE 0.05 % EX CREA: 1.0000 "application " | TOPICAL_CREAM | Freq: Two times a day (BID) | CUTANEOUS | 0 refills | Status: DC

## 2017-08-25 ENCOUNTER — Telehealth: Payer: Self-pay | Admitting: Family Medicine

## 2017-08-25 NOTE — Telephone Encounter (Signed)
Copied from [place] [id number]. Topic: Quick Communication - See Telephone Encounter >> Aug 25, 2017  9:30 AM Conception Chancy, NT wrote: CRM for notification. See Telephone encounter for: 08/25/17.  Patient is calling and was seen on 08/09/17 and was diagnosed with a sinus infection. She states she feels worse and that amoxicillin-clavulanate (AUGMENTIN) 875-125 MG tablet   did not help at all. She would like to know can something else be called in? Please advise. [phone]  [id number] - [place], [place] [address]  Phone: [phone] Fax: [phone]

## 2017-08-26 ENCOUNTER — Ambulatory Visit
Admission: RE | Admit: 2017-08-26 | Discharge: 2017-08-26 | Disposition: A | Discharge status: Home / Self Care | Payer: BLUE CROSS/BLUE SHIELD | Source: Ambulatory Visit | Attending: Family Medicine | Admitting: Family Medicine

## 2017-08-26 DIAGNOSIS — Z1231 Encounter for screening mammogram for malignant neoplasm of breast: Secondary | ICD-10-CM | POA: Diagnosis not present

## 2017-08-26 IMAGING — MG MM DIGITAL SCREENING BILAT W/ CAD
4 series · 4 of 4 positions shown · non-contrast
Comparison: Previous exam(s).

CLINICAL DATA: Screening.

EXAM:
DIGITAL SCREENING BILATERAL MAMMOGRAM WITH CAD

[L MLO]
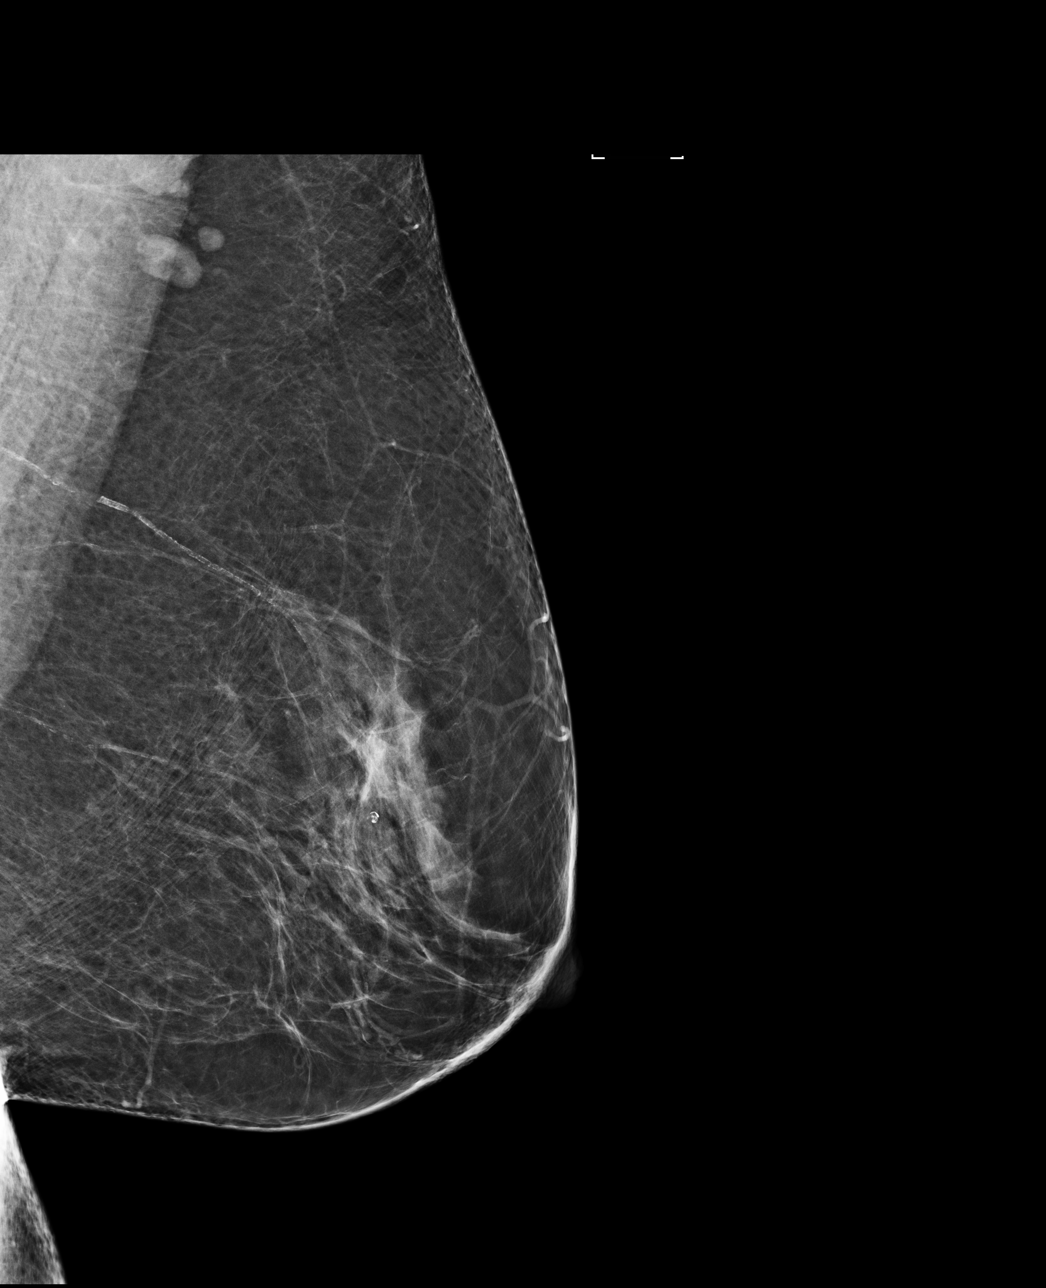

[R MLO]
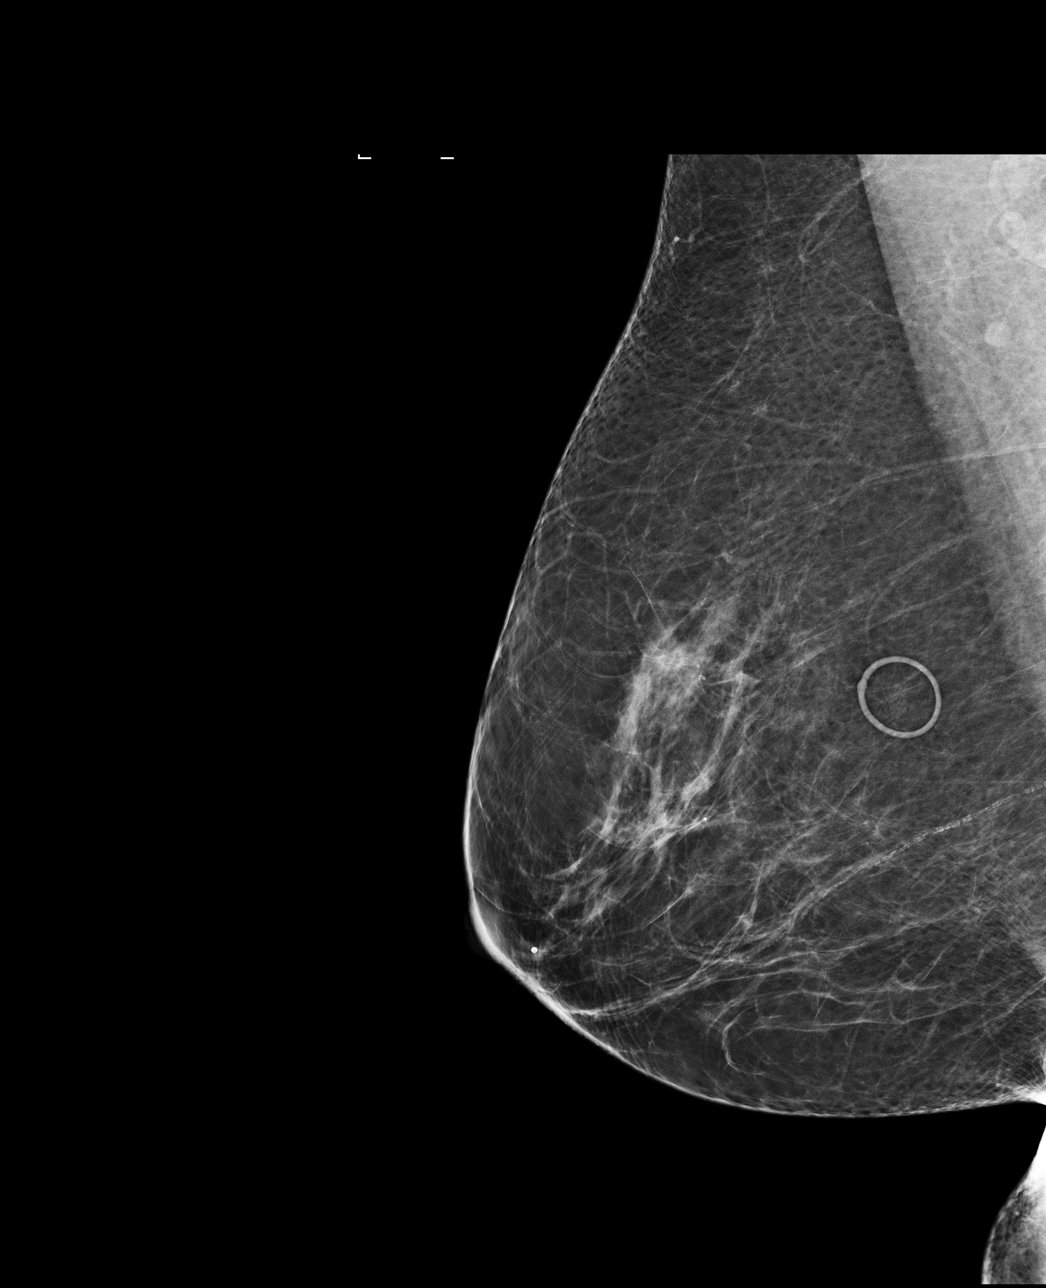

[R CC]
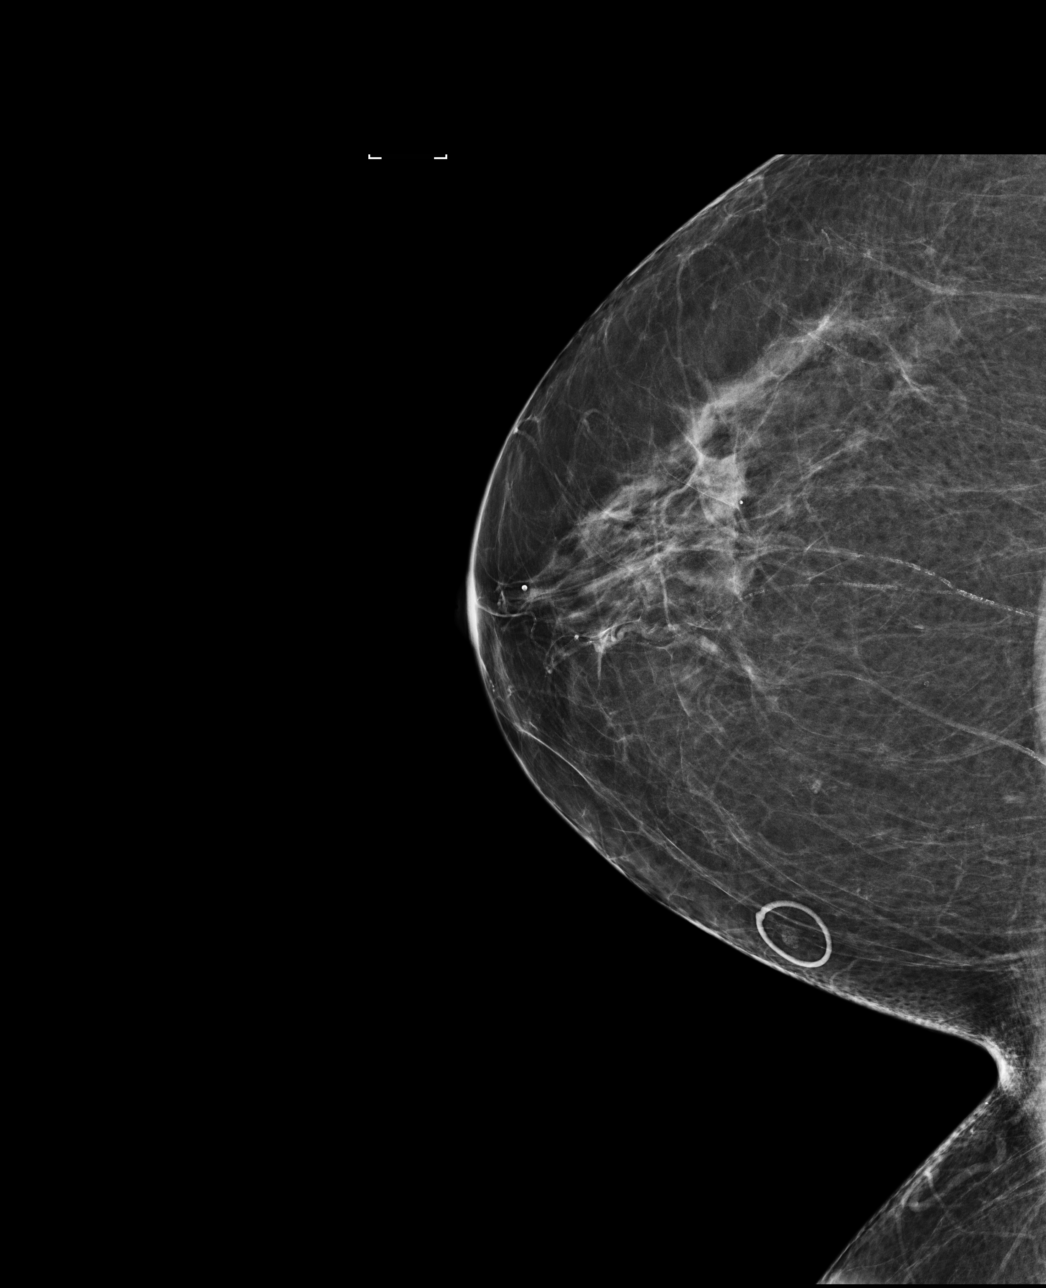

[L CC]
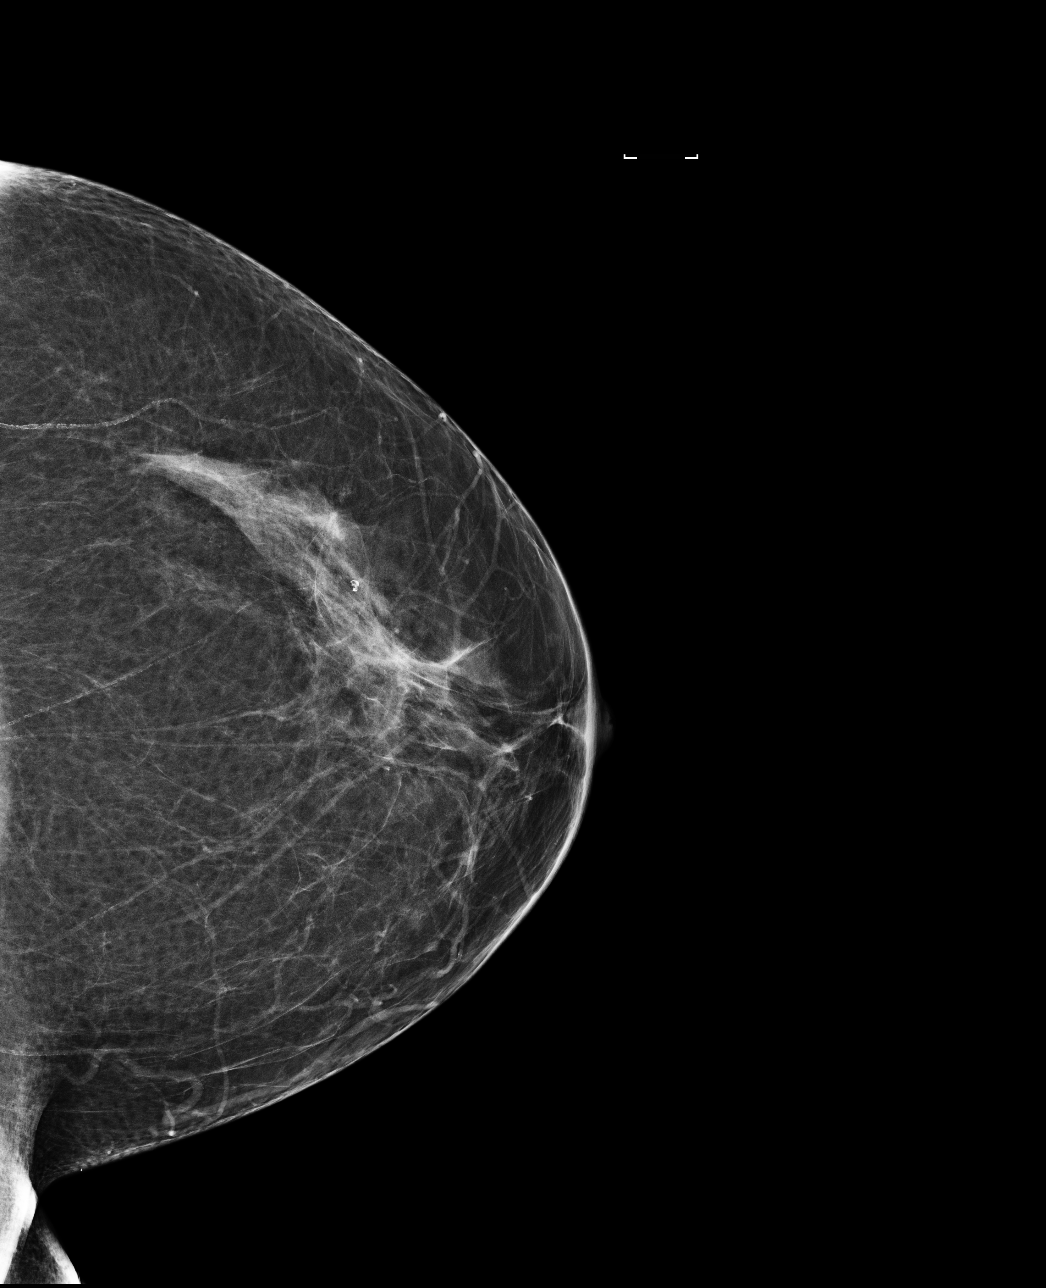

[4 of 4 positions shown; findings below may reference images not displayed]

ACR Breast Density Category c: The breast tissue is heterogeneously
dense, which may obscure small masses.
FINDINGS: There are no findings suspicious for malignancy. Images were
processed with CAD.
IMPRESSION: No mammographic evidence of malignancy. A result letter of this
screening mammogram will be mailed directly to the patient.

RECOMMENDATION:
Screening mammogram in one year. (Code:[0J])

BI-RADS CATEGORY  1: Negative.

## 2017-08-26 MED ORDER — PREDNISONE 10 MG PO TABS: ORAL_TABLET | ORAL | 0 refills | Status: DC

## 2017-08-26 NOTE — Telephone Encounter (Signed)
Did she start the Flonase as recommended. We can try a round of steroids if she would like.

## 2017-08-26 NOTE — Telephone Encounter (Signed)
Patient returning Melanie's call and stated she has started [facility] and is willing to try the steroids if Garnette Gunner thinks it will help. Would like a call to discuss.

## 2017-08-26 NOTE — Telephone Encounter (Signed)
pred taper sent to pharmacy

## 2017-08-26 NOTE — Telephone Encounter (Signed)
Left message on voicemail.

## 2017-08-26 NOTE — Addendum Note (Signed)
Addended by: Jearld Fenton on: 08/26/2017 03:05 PM   Modules accepted: Orders

## 2017-08-26 NOTE — Telephone Encounter (Signed)
I spoke with pt and she is wanting to try the steroids and she is running a low grade fever of 99.5 which is making pt "feel bad"; I advised she can take tylenol for the fever and pt voiced understanding. Pt will ck [facility] [address] later today to pick up rx.

## 2017-09-02 ENCOUNTER — Other Ambulatory Visit (INDEPENDENT_AMBULATORY_CARE_PROVIDER_SITE_OTHER): Payer: BLUE CROSS/BLUE SHIELD

## 2017-09-02 ENCOUNTER — Telehealth: Payer: Self-pay | Admitting: Family Medicine

## 2017-09-02 DIAGNOSIS — I10 Essential (primary) hypertension: Secondary | ICD-10-CM

## 2017-09-02 LAB — COMPREHENSIVE METABOLIC PANEL
ALT: 15 U/L (ref 0–35)
AST: 17 U/L (ref 0–37)
Albumin: 3.9 g/dL (ref 3.5–5.2)
Alkaline Phosphatase: 51 U/L (ref 39–117)
BUN: 13 mg/dL (ref 6–23)
CO2: 30 meq/L (ref 19–32)
CREATININE: 0.8 mg/dL (ref 0.40–1.20)
Calcium: 9.1 mg/dL (ref 8.4–10.5)
Chloride: 100 mEq/L (ref 96–112)
GFR: 76.6 mL/min (ref >60.00)
Glucose, Bld: 96 mg/dL (ref 70–99)
Potassium: 3.8 mEq/L (ref 3.5–5.1)
SODIUM: 138 meq/L (ref 135–145)
Total Bilirubin: 0.3 mg/dL (ref 0.2–1.2)
Total Protein: 7 g/dL (ref 6.0–8.3)

## 2017-09-02 LAB — LIPID PANEL
CHOL/HDL RATIO: 3
Cholesterol: 195 mg/dL (ref 0–200)
HDL: 68.9 mg/dL (ref >39.00)
LDL CALC: 90 mg/dL (ref 0–99)
NonHDL: 126.24
Triglycerides: 180 mg/dL — ABNORMAL HIGH (ref 0.0–149.0)
VLDL: 36 mg/dL (ref 0.0–40.0)

## 2017-09-02 NOTE — Telephone Encounter (Signed)
-----   Message from Lendon Collar, RT sent at 08/25/2017 11:20 AM EDT ----- Regarding: Lab orders for Friday 09/02/17 Can you please enter CPE lab orders for Friday 09/02/17? Thanks-Lauren

## 2017-09-06 ENCOUNTER — Encounter: Payer: Self-pay | Admitting: Family Medicine

## 2017-09-06 ENCOUNTER — Ambulatory Visit (INDEPENDENT_AMBULATORY_CARE_PROVIDER_SITE_OTHER): Payer: BLUE CROSS/BLUE SHIELD | Admitting: Family Medicine

## 2017-09-06 ENCOUNTER — Other Ambulatory Visit: Payer: Self-pay

## 2017-09-06 VITALS — BP 124/70 | HR 61 | Temp 98.4°F | Ht 64.0 in | Wt 182.5 lb

## 2017-09-06 DIAGNOSIS — Z0000 Encounter for general adult medical examination without abnormal findings: Secondary | ICD-10-CM

## 2017-09-06 DIAGNOSIS — I10 Essential (primary) hypertension: Secondary | ICD-10-CM | POA: Diagnosis not present

## 2017-09-06 DIAGNOSIS — C519 Malignant neoplasm of vulva, unspecified: Secondary | ICD-10-CM

## 2017-09-06 DIAGNOSIS — C4499 Other specified malignant neoplasm of skin, unspecified: Secondary | ICD-10-CM | POA: Diagnosis not present

## 2017-09-06 DIAGNOSIS — D649 Anemia, unspecified: Secondary | ICD-10-CM | POA: Diagnosis not present

## 2017-09-06 DIAGNOSIS — J019 Acute sinusitis, unspecified: Secondary | ICD-10-CM

## 2017-09-06 NOTE — Progress Notes (Addendum)
Subjective:    Patient ID: [name], female    DOB: January 21, 1951, 67 y.o.   MRN: [id number]  HPI   The patient is here for annual wellness exam and preventative care.    Ongoing facial pain   Dx with acute bacterial sinus infection 3/5 She was treated with Augmentin.. Improved a little but not much.  Also S/P pred taper.. Did not help much.  Using nasal saline, flonase, astelin, on Xyzal and singulair.  Post drip drip cough, no SOB, no fever. Hx of allergies  Saw GYN (Dr. Aldean Ast) for Paget's disease of vulva, s/p excision in 2011.  Hypertension:  Good control on losartanHCTZ and lopressor   Using medication without problems or lightheadedness:  none Chest pain with exertion:none Edema:none Short of breath: none Average home BPs: Other issues:  Elevated Cholesterol:  Good control , improved on no med. Lab Results  Component Value Date   CHOL 195 09/02/2017   HDL 68.90 09/02/2017   LDLCALC 90 09/02/2017   LDLDIRECT 122.5 07/14/2009   TRIG 180.0 (H) 09/02/2017   CHOLHDL 3 09/02/2017  Using medications without problems: Muscle aches:  Diet compliance: moderate Exercise: minimally Other complaints:   Told anemia at blood donation.  Social History /Family History/Past Medical History reviewed in detail and updated in EMR if needed. Blood pressure 124/70, pulse 61, temperature 98.4 F (36.9 C), temperature source Oral, height 5\' 4"  (1.626 m), weight 182 lb 8 oz (82.8 kg).   Review of Systems  Constitutional: Negative for fatigue and fever.  HENT: Negative for congestion and ear pain.   Eyes: Negative for pain.  Respiratory: Negative for cough, chest tightness and shortness of breath.   Cardiovascular: Negative for chest pain, palpitations and leg swelling.  Gastrointestinal: Negative for abdominal pain.  Genitourinary: Negative for dysuria and vaginal bleeding.  Musculoskeletal: Negative for back pain.  Neurological: Negative for syncope, light-headedness  and headaches.  Psychiatric/Behavioral: Negative for dysphoric mood.       Objective:   Physical Exam  Constitutional: Vital signs are normal. She appears well-developed and well-nourished. She is cooperative.  Non-toxic appearance. She does not appear ill. No distress.  HENT:  Head: Normocephalic.  Right Ear: Hearing, tympanic membrane, external ear and ear canal normal.  Left Ear: Hearing, tympanic membrane, external ear and ear canal normal.  Nose: Nose normal.  Eyes: Pupils are equal, round, and reactive to light. Conjunctivae, EOM and lids are normal. Lids are everted and swept, no foreign bodies found.  Neck: Trachea normal and normal range of motion. Neck supple. Carotid bruit is not present. No thyroid mass and no thyromegaly present.  Cardiovascular: Normal rate, regular rhythm, S1 normal, S2 normal, normal heart sounds and intact distal pulses. Exam reveals no gallop.  No murmur heard. Pulmonary/Chest: Effort normal and breath sounds normal. No respiratory distress. She has no wheezes. She has no rhonchi. She has no rales.  Abdominal: Soft. Normal appearance and bowel sounds are normal. She exhibits no distension, no fluid wave, no abdominal bruit and no mass. There is no hepatosplenomegaly. There is no tenderness. There is no rebound, no guarding and no CVA tenderness. No hernia.  Lymphadenopathy:    She has no cervical adenopathy.    She has no axillary adenopathy.  Neurological: She is alert. She has normal strength. No cranial nerve deficit or sensory deficit.  Skin: Skin is warm, dry and intact. No rash noted.  Psychiatric: Her speech is normal and behavior is normal. Judgment normal. Her mood  appears not anxious. Cognition and memory are normal. She does not exhibit a depressed mood.          Assessment & Plan:  The patient's preventative maintenance and recommended screening tests for an annual wellness exam were reviewed in full today. Brought up to date unless  services declined.  Counselled on the importance of diet, exercise, and its role in overall health and mortality. The patient's FH and SH was reviewed, including their home life, tobacco status, and drug and alcohol status.   Vaccines: Uptodate with Tdap, flu and shingles given. PAP/DVE: Followed by GYN, partial hysterectomy.. Pap Not indicated. Mammo: 08/2017 nml  Colon: nml 2017 Dr. Carlean Purl, repeat in 10 years. Hep C : done  HIV: refused

## 2017-09-06 NOTE — Assessment & Plan Note (Signed)
Well controlled. Continue current medication.  

## 2017-09-06 NOTE — Assessment & Plan Note (Signed)
No bleeding, nml colonoscopy and no menses.  Eval with labs.

## 2017-09-06 NOTE — Patient Instructions (Addendum)
Get back to regular exercise and keep up low fat low carb diet.  Please stop at the lab to have labs drawn.   Make sure to  use nasal saline

## 2017-09-06 NOTE — Assessment & Plan Note (Signed)
Followed by GYN

## 2017-09-06 NOTE — Assessment & Plan Note (Signed)
Minimal current symptoms, but pt worried it is coming back. Encouraged her to increase nasal saline and to  contiunue allergy meds.  No clear indication for antibiotics.

## 2017-09-07 LAB — CBC WITH DIFFERENTIAL/PLATELET
BASOS PCT: 1.4 % (ref 0.0–3.0)
Basophils Absolute: 0.1 10*3/uL (ref 0.0–0.1)
EOS PCT: 1.6 % (ref 0.0–5.0)
Eosinophils Absolute: 0.1 10*3/uL (ref 0.0–0.7)
HEMATOCRIT: 36.1 % (ref 36.0–46.0)
Hemoglobin: 11.6 g/dL — ABNORMAL LOW (ref 12.0–15.0)
LYMPHS PCT: 26.4 % (ref 12.0–46.0)
Lymphs Abs: 2.5 10*3/uL (ref 0.7–4.0)
MCHC: 32.1 g/dL (ref 30.0–36.0)
MCV: 80 fl (ref 78.0–100.0)
MONO ABS: 0.7 10*3/uL (ref 0.1–1.0)
Monocytes Relative: 7.4 % (ref 3.0–12.0)
Neutro Abs: 6.1 10*3/uL (ref 1.4–7.7)
Neutrophils Relative %: 63.2 % (ref 43.0–77.0)
Platelets: 346 10*3/uL (ref 150.0–400.0)
RBC: 4.51 Mil/uL (ref 3.87–5.11)
RDW: 17.6 % — AB (ref 11.5–15.5)
WBC: 9.6 10*3/uL (ref 4.0–10.5)

## 2017-09-07 LAB — IBC PANEL
Iron: 65 ug/dL (ref 42–145)
Saturation Ratios: 12.9 % — ABNORMAL LOW (ref 20.0–50.0)
TRANSFERRIN: 361 mg/dL — AB (ref 212.0–360.0)

## 2017-09-07 LAB — VITAMIN B12: VITAMIN B 12: 453 pg/mL (ref 211–911)

## 2017-09-07 LAB — FERRITIN: FERRITIN: 6.4 ng/mL — AB (ref 10.0–291.0)

## 2017-09-08 ENCOUNTER — Telehealth: Payer: Self-pay | Admitting: *Deleted

## 2017-09-08 NOTE — Telephone Encounter (Signed)
Copied from CRM #[id number]. Topic: General - Other >> Sep 08, 2017  3:34 PM Simmons, Janett L, NT wrote: Reason for CRM: [employer] called and is faxing over a request and would like a call once received please call [phone] claim # [id number]  >> Sep 08, 2017  4:06 PM Isley, Rena M, LPN wrote: I am sending to Penny Loring CMA and Carrier Efird. 

## 2017-09-09 ENCOUNTER — Telehealth: Payer: Self-pay | Admitting: Family Medicine

## 2017-09-09 DIAGNOSIS — Z0279 Encounter for issue of other medical certificate: Secondary | ICD-10-CM

## 2017-09-09 NOTE — Telephone Encounter (Signed)
Spoke with pt  She stated she has had a sinus infection. 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

## 2017-09-09 NOTE — Telephone Encounter (Signed)
error 

## 2017-09-09 NOTE — Telephone Encounter (Signed)
In outbox

## 2017-09-12 ENCOUNTER — Encounter: Payer: Self-pay | Admitting: Family Medicine

## 2017-09-12 ENCOUNTER — Ambulatory Visit (INDEPENDENT_AMBULATORY_CARE_PROVIDER_SITE_OTHER): Payer: BLUE CROSS/BLUE SHIELD | Admitting: Family Medicine

## 2017-09-12 ENCOUNTER — Ambulatory Visit: Payer: Self-pay | Admitting: *Deleted

## 2017-09-12 ENCOUNTER — Encounter: Payer: Self-pay | Admitting: *Deleted

## 2017-09-12 ENCOUNTER — Telehealth: Payer: Self-pay | Admitting: Family Medicine

## 2017-09-12 VITALS — BP 114/68 | HR 55 | Temp 98.5°F | Wt 182.5 lb

## 2017-09-12 DIAGNOSIS — J309 Allergic rhinitis, unspecified: Secondary | ICD-10-CM

## 2017-09-12 DIAGNOSIS — R0981 Nasal congestion: Secondary | ICD-10-CM | POA: Diagnosis not present

## 2017-09-12 DIAGNOSIS — R05 Cough: Secondary | ICD-10-CM

## 2017-09-12 DIAGNOSIS — R059 Cough, unspecified: Secondary | ICD-10-CM

## 2017-09-12 NOTE — Telephone Encounter (Signed)
Paperwork fax Pt aware  Copy for scan Copy for pt Copy for billing

## 2017-09-12 NOTE — Progress Notes (Signed)
Subjective:    Patient ID: [name], female    DOB: 10-13-1950, 67 y.o.   MRN: [id number]  HPI This is a 67 yo female who presents today with nasal congestion and cough x several weeks. Thinks she has been running a low grade fever at 99.5, cough mostly dry, little nasal drainage, clear (was green before antibiotic), some post nasal drainage, this has decreased. Some hoarseness of voice. No SOB, no wheeze.  She is having headaches over eyes off and on, relieved with Tylenol. Most bothersome symptom is cough, good relief with Robitussin DM.  Took augmentin for 10 days last month.  Has long standing history of allergic rhinitis- taking montelukast, Xyzal, flonase, astelin. Uses saline rinse daily. Sees allergist every 5 months.  Past Medical History:  Diagnosis Date  . Allergic rhinitis   . Arthritis   . GERD (gastroesophageal reflux disease)   . Headache(784.0)    Migraines  . History of adenomatous polyp of colon   . History of concussion    AS CHILD--  NO RESIDUAL  . History of palpitations   . Hypertension   . Paget's disease of vulva   . PONV (postoperative nausea and vomiting)    SEVERE  . Wears glasses    Past Surgical History:  Procedure Laterality Date  . mucoid cyst removal thumb    . 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 PULLEY RELEASE RIGHT THUMB  03-23-2011  . SIMPLE VULVECTOMY  03-24-2010   right side  . TONSILLECTOMY  1977  . TRIGGER FINGER RELEASE    . VAGINAL HYSTERECTOMY  1989   and Anterior and posterior repair's for prolapse  . VULVECTOMY Right 01/10/2015   Procedure: RIGHT WIDE LOCAL EXCISION VULVECTOMY;  Surgeon: [name], MD;  Location: [facility];  Service: Gynecology;  Laterality: Right;  Marland Kitchen VULVECTOMY Right 04/10/2015   Procedure: WIDE LOCAL EXCISION VULVA;  Surgeon: [name], MD;  Location: [facility];   Service: Gynecology;  Laterality: Right;  . WISDOM TOOTH EXTRACTION     Family History  Problem Relation Age of Onset  . Cancer Maternal Grandmother   . Hypertension Father   . Heart disease Father   . Asthma Father   . Hypertension Mother   . Heart disease Mother   . Breast cancer Paternal Grandmother 13  . Colon cancer Paternal Uncle   . Allergic rhinitis Neg Hx   . Angioedema Neg Hx   . Eczema Neg Hx   . Immunodeficiency Neg Hx   . Urticaria Neg Hx    Social History   Tobacco Use  . Smoking status: Former Smoker    Years: 5.00    Types: Cigarettes    Last attempt to quit: 04/15/1989    Years since quitting: 28.4  . Smokeless tobacco: Never Used  Substance Use Topics  . Alcohol use: Yes    Alcohol/week: 0.6 oz    Types: 1 Glasses of wine per week    Comment: occasional wine  . Drug use: No      Review of Systems Per HPI    Objective:   Physical Exam  Constitutional: She is oriented to person, place, and time. She appears well-developed and well-nourished. No distress.  HENT:  Head: Normocephalic and atraumatic.  Right Ear: Tympanic membrane, external ear and ear canal normal.  Left Ear: Tympanic membrane, external ear and ear canal normal.  Nose: Mucosal edema present.  Very narrow nasal passages.   Eyes: Conjunctivae are normal.  Neck: Normal range of motion. Neck supple.  Cardiovascular: Normal rate, regular rhythm and normal heart sounds.  Pulmonary/Chest: Effort normal and breath sounds normal. No respiratory distress. She has no wheezes. She has no rales.  No cough witnessed while in exam room.   Lymphadenopathy:    She has no cervical adenopathy.  Neurological: She is alert and oriented to person, place, and time.  Skin: Skin is warm and dry. She is not diaphoretic.  Psychiatric: She has a normal mood and affect. Her behavior is normal. Judgment and thought content normal.  Vitals reviewed.    BP 114/68   Pulse (!) 55   Temp 98.5 F (36.9 C)  (Oral)   Wt 182 lb 8 oz (82.8 kg)   SpO2 95%   BMI 31.33 kg/m  Wt Readings from Last 3 Encounters:  09/12/17 182 lb 8 oz (82.8 kg)  09/06/17 182 lb 8 oz (82.8 kg)  08/09/17 186 lb (84.4 kg)    .    Assessment & Plan:  1. Cough - Lung sounds clear, encouraged her to continue OTC cough suppressant since she is getting good relief - can add Mucinex  2. Nasal congestion - continue current meds, encouraged her to use saline nose spray more frequently  3. 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 OOW x 1 day.   Clarene Reamer, FNP-BC  [facility]  09/12/2017 11:02 AM

## 2017-09-12 NOTE — Telephone Encounter (Signed)
Client [facility] Night - Client Client Site [facility] - Night Contact Type Call Who Is Calling Patient / Member / Family / Caregiver Caller Name Dustine Bertini Caller Phone Number [phone] Patient Name [name] Patient DOB 11/22/1950 Call Type Message Only Information Provided Reason for Call Request for General Office Information Initial Comment Caller states that she has a cough hoarse voice. She needs a note for the time she has missed at work this weekend. She has sinus infection, pressure in her head, and post nasal drip. Call Closed By: Yehuda Savannah Transaction Date/Time: 09/10/2017 11:47:57 AM (ET)

## 2017-09-12 NOTE — Telephone Encounter (Signed)
Not sure if patient left message on machine? Can you please call and clarify when she needs the note written for?

## 2017-09-12 NOTE — Patient Instructions (Signed)
Good to see you today, I am sorry you are not feeling well  Continue your current medications for Allergic rhinitis Schedule follow up with your allergist- ask about oral immunotherapy   Please use your saline rinse 2-4 times a day, especially after being outside Can use Delsym for cough Take mucinex twice a day to thin your nasal drainage and chest congestion  If not better in 5-7 days, please let us know   Allergic Rhinitis, Adult Allergic rhinitis is an allergic reaction that affects the mucous membrane inside the nose. It causes sneezing, a runny or stuffy nose, and the feeling of mucus going down the back of the throat (postnasal drip). Allergic rhinitis can be mild to severe. There are two types of allergic rhinitis:  Seasonal. This type is also called hay fever. It happens only during certain seasons.  Perennial. This type can happen at any time of the year.  What are the causes? This condition happens when the body's defense system (immune system) responds to certain harmless substances called allergens as though they were germs.  Seasonal allergic rhinitis is triggered by pollen, which can come from grasses, trees, and weeds. Perennial allergic rhinitis may be caused by:  House dust mites.  Pet dander.  Mold spores.  What are the signs or symptoms? Symptoms of this condition include:  Sneezing.  Runny or stuffy nose (nasal congestion).  Postnasal drip.  Itchy nose.  Tearing of the eyes.  Trouble sleeping.  Daytime sleepiness.  How is this diagnosed? This condition may be diagnosed based on:  Your medical history.  A physical exam.  Tests to check for related conditions, such as: ? Asthma. ? Pink eye. ? Ear infection. ? Upper respiratory infection.  Tests to find out which allergens trigger your symptoms. These may include skin or blood tests.  How is this treated? There is no cure for this condition, but treatment can help control symptoms.  Treatment may include:  Taking medicines that block allergy symptoms, such as antihistamines. Medicine may be given as a shot, nasal spray, or pill.  Avoiding the allergen.  Desensitization. This treatment involves getting ongoing shots until your body becomes less sensitive to the allergen. This treatment may be done if other treatments do not help.  If taking medicine and avoiding the allergen does not work, new, stronger medicines may be prescribed.  Follow these instructions at home:  Find out what you are allergic to. Common allergens include smoke, dust, and pollen.  Avoid the things you are allergic to. These are some things you can do to help avoid allergens: ? Replace carpet with wood, tile, or vinyl flooring. Carpet can trap dander and dust. ? Do not smoke. Do not allow smoking in your home. ? Change your heating and air conditioning filter at least once a month. ? During allergy season:  Keep windows closed as much as possible.  Plan outdoor activities when pollen counts are lowest. This is usually during the evening hours.  When coming indoors, change clothing and shower before sitting on furniture or bedding.  Take over-the-counter and prescription medicines only as told by your health care provider.  Keep all follow-up visits as told by your health care provider. This is important. Contact a health care provider if:  You have a fever.  You develop a persistent cough.  You make whistling sounds when you breathe (you wheeze).  Your symptoms interfere with your normal daily activities. Get help right away if:  You have shortness  of breath. Summary  This condition can be managed by taking medicines as directed and avoiding allergens.  Contact your health care provider if you develop a persistent cough or fever.  During allergy season, keep windows closed as much as possible. This information is not intended to replace advice given to you by your health care  provider. Make sure you discuss any questions you have with your health care provider. Document Released: 02/16/2001 Document Revised: 07/01/2016 Document Reviewed: 07/01/2016 Elsevier Interactive Patient Education  [employer].

## 2017-09-12 NOTE — Telephone Encounter (Signed)
Patient is still having sinus infection symptoms, congestion and cough. Was seen by Dr Diona Browner 09/06/17. Would like to speak with a nurse     Patient is calling to report that she is still experiencing a cough- she has pressure in her head-and has headache. Eyes are burning and uncomfortable. Patient is using Robitussin and tylenol. Patient has not returned to work. She thinks she may have a sinus infection now. Appointment to evaluate.  Reason for Disposition . [1] Continuous (nonstop) coughing interferes with work or school AND [2] no improvement using cough treatment per Care Advice  Answer Assessment - Initial Assessment Questions 1. ONSET: "When did the cough begin?"      3 weeks- headaches in front 2. SEVERITY: "How bad is the cough today?"      Dry cough- hoarseness - sore throat-drainage 3. RESPIRATORY DISTRESS: "Describe your breathing."      no 4. FEVER: "Do you have a fever?" If so, ask: "What is your temperature, how was it measured, and when did it start?"     99.5 yesterday 5. SPUTUM: "Describe the color of your sputum" (clear, white, yellow, green)     clear 6. HEMOPTYSIS: "Are you coughing up any blood?" If so ask: "How much?" (flecks, streaks, tablespoons, etc.)     no 7. CARDIAC HISTORY: "Do you have any history of heart disease?" (e.g., heart attack, congestive heart failure)      no 8. LUNG HISTORY: "Do you have any history of lung disease?"  (e.g., pulmonary embolus, asthma, emphysema)     asthma 9. PE RISK FACTORS: "Do you have a history of blood clots?" (or: recent major surgery, recent prolonged travel, bedridden )     no 10. OTHER SYMPTOMS: "Do you have any other symptoms?" (e.g., runny nose, wheezing, chest pain)       Congestion in sinus 11. PREGNANCY: "Is there any chance you are pregnant?" "When was your last menstrual period?"       n/a  12. TRAVEL: "Have you traveled out of the country in the last month?" (e.g., travel history, exposures)        n/a  Protocols used: [place]

## 2017-09-13 NOTE — Telephone Encounter (Signed)
Lm on pts vm requesting a call back to clarify when work note is needed. Message was recorded 4/6, but pt was in office for OV 4/8 and only requested a note for that day.

## 2017-09-13 NOTE — Telephone Encounter (Signed)
Pt states the note provided 09/12/17 is sufficient. Pt states Shirlean Mylar is working on other paperwork.

## 2017-09-14 NOTE — Telephone Encounter (Signed)
Signed off on paperwork today.

## 2017-09-19 ENCOUNTER — Ambulatory Visit (INDEPENDENT_AMBULATORY_CARE_PROVIDER_SITE_OTHER): Payer: BLUE CROSS/BLUE SHIELD | Admitting: Family Medicine

## 2017-09-19 ENCOUNTER — Telehealth: Payer: Self-pay | Admitting: Family Medicine

## 2017-09-19 ENCOUNTER — Encounter: Payer: Self-pay | Admitting: Family Medicine

## 2017-09-19 VITALS — BP 122/78 | HR 60 | Temp 98.6°F | Ht 64.0 in | Wt 185.0 lb

## 2017-09-19 DIAGNOSIS — J309 Allergic rhinitis, unspecified: Secondary | ICD-10-CM

## 2017-09-19 DIAGNOSIS — J209 Acute bronchitis, unspecified: Secondary | ICD-10-CM | POA: Diagnosis not present

## 2017-09-19 MED ORDER — HYDROCODONE-HOMATROPINE 5-1.5 MG/5ML PO SYRP: 5.0000 mL | ORAL_SOLUTION | Freq: Three times a day (TID) | ORAL | 0 refills | Status: DC | PRN

## 2017-09-19 MED ORDER — PREDNISONE 10 MG PO TABS: ORAL_TABLET | ORAL | 0 refills | Status: DC

## 2017-09-19 NOTE — Progress Notes (Signed)
Subjective:    Patient ID: [name], female    DOB: 05-13-1951, 67 y.o.   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 Acute bronchitis 09/19/2017  . Anemia 09/06/2017  . Laryngopharyngeal reflux (LPR) 10/19/2016  . Persistent cough for 3 weeks or longer 08/05/2015  . Reactive airway disease 04/24/2013  . Acute sinusitis 07/25/2012  . Paget's disease of vulva 05/01/2012  . COLONIC POLYPS, ADENOMATOUS, HX OF 12/11/2008  . Migraine without aura 10/10/2008  . Essential hypertension, benign 10/10/2008  . Allergic rhinitis 10/10/2008  . GERD 10/10/2008  . PERIMENOPAUSAL SYNDROME 10/10/2008  . OSTEOARTHRITIS 10/10/2008  . DEGENERATIVE DISC DISEASE, CERVICAL SPINE 10/10/2008   Past Medical History:  Diagnosis Date  . Allergic rhinitis   . Arthritis   . GERD (gastroesophageal reflux disease)   . Headache(784.0)    Migraines  . History of adenomatous polyp of colon   . History of concussion    AS CHILD--  NO RESIDUAL  . History of palpitations   . Hypertension   . Paget's disease of vulva   . PONV (postoperative  nausea and vomiting)    SEVERE  . Wears glasses    Past Surgical History:  Procedure Laterality Date  . mucoid cyst removal thumb    . 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 PULLEY RELEASE RIGHT THUMB  03-23-2011  . SIMPLE VULVECTOMY  03-24-2010   right side  . TONSILLECTOMY  1977  . TRIGGER FINGER RELEASE    . VAGINAL HYSTERECTOMY  1989   and Anterior and posterior repair's for prolapse  . VULVECTOMY Right 01/10/2015   Procedure: RIGHT WIDE LOCAL EXCISION VULVECTOMY;  Surgeon: [name], MD;  Location: [facility];  Service: Gynecology;  Laterality: Right;  Marland Kitchen VULVECTOMY Right 04/10/2015   Procedure: WIDE LOCAL EXCISION VULVA;  Surgeon: [name], MD;  Location: [facility];  Service: Gynecology;  Laterality: Right;  . WISDOM TOOTH EXTRACTION     Social History   Tobacco Use  . Smoking status: Former Smoker    Years: 5.00    Types: Cigarettes    Last attempt to quit: 04/15/1989    Years since quitting: 28.4  . Smokeless tobacco: Never Used  Substance Use Topics  . Alcohol use: Yes    Alcohol/week: 0.6 oz  Types: 1 Glasses of wine per week    Comment: occasional wine  . Drug use: No   Family History  Problem Relation Age of Onset  . Cancer Maternal Grandmother   . Hypertension Father   . Heart disease Father   . Asthma Father   . Hypertension Mother   . Heart disease Mother   . Breast cancer Paternal Grandmother 19  . Colon cancer Paternal Uncle   . Allergic rhinitis Neg Hx   . Angioedema Neg Hx   . Eczema Neg Hx   . Immunodeficiency Neg Hx   . Urticaria Neg Hx    Allergies  Allergen Reactions  . Ibuprofen Hypertension  . Naprosyn [Naproxen] Nausea Only  . Other     Allergic to toothpaste- makes mouth peel Hay fever/dust mites/trees "365 allergic"   . Azithromycin Itching and Rash  . Sulfa Antibiotics Hives and Rash    Current Outpatient Medications on File Prior to Visit  Medication Sig Dispense Refill  . albuterol (PROVENTIL HFA;VENTOLIN HFA) 108 (90 Base) MCG/ACT inhaler Inhale 2 puffs into the lungs every 6 (six) hours as needed. Reported on 10/24/2015 1 Inhaler 0  . azelastine (ASTELIN) 0.1 % nasal spray USE 1 SPRAY IN EACH NOSTRIL TWICE A DAY AS DIRECTED 90 mL 1  . clobetasol cream (TEMOVATE) 2.42 % Apply 1 application topically 2 (two) times daily. To the vulva 30 g 0  . famotidine (PEPCID) 20 MG tablet Take 1 tablet (20 mg total) by mouth 2 (two) times daily. 180 tablet 1  . fluticasone (FLONASE) 50 MCG/ACT nasal spray Place 2 sprays into the nose daily as needed.     . lansoprazole (PREVACID) 30 MG capsule Take 30 mg by mouth 2 (two) times daily before a meal.     . levocetirizine (XYZAL) 5 MG tablet Take 5 mg by mouth every evening.    Marland Kitchen losartan-hydrochlorothiazide (HYZAAR) 100-25 MG tablet Take 1 tablet by mouth every morning. 90 tablet 0  . metoprolol tartrate (LOPRESSOR) 25 MG tablet TAKE ONE-FOURTH (1/4) TABLET (6.25 MG) TWICE A DAY 45 tablet 0  . montelukast (SINGULAIR) 10 MG tablet TAKE 1 TABLET EVERY MORNING 90 tablet 3  . OVER THE COUNTER MEDICATION [employer] Z take 2 tablets by mouth at bedtime.    . Triamcinolone & Emollient (DERMASORB TA) 0.1 % KIT   0  . triamcinolone ointment (KENALOG) 0.5 % Apply 1 application topically 2 (two) times daily. 30 g 1   No current facility-administered medications on file prior to visit.     Review of Systems  Constitutional: Positive for appetite change and fatigue. Negative for fever.       Low grade temp at home  HENT: Positive for congestion, postnasal drip, rhinorrhea, sinus pressure, sneezing and sore throat. Negative for ear pain.   Eyes: Negative for pain and discharge.  Respiratory: Positive for cough. Negative for shortness of breath, wheezing and stridor.   Cardiovascular: Negative for chest pain.  Gastrointestinal: Negative for  diarrhea, nausea and vomiting.  Genitourinary: Negative for frequency, hematuria and urgency.  Musculoskeletal: Negative for arthralgias and myalgias.  Skin: Negative for rash.  Neurological: Positive for headaches. Negative for dizziness, weakness and light-headedness.  Psychiatric/Behavioral: Negative for confusion and dysphoric mood.       Objective:   Physical Exam  Constitutional: She appears well-developed and well-nourished. No distress.  Well appearing  HENT:  Head: Normocephalic and atraumatic.  Right Ear: External ear normal.  Left Ear: External ear normal.  Mouth/Throat: Oropharynx is clear and moist.  Nares are injected and congested  No sinus tenderness Clear rhinorrhea and post nasal drip   Eyes: Pupils are equal, round, and reactive to light. Conjunctivae and EOM are normal. Right eye exhibits no discharge. Left eye exhibits no discharge.  Neck: Normal range of motion. Neck supple.  Cardiovascular: Normal rate and normal heart sounds.  Pulmonary/Chest: Effort normal. No respiratory distress. She has wheezes. She has no rales. She exhibits no tenderness.  Harsh bs Scant wheeze or forced exp  No prolonged exp time   No rales or rhonchi  Lymphadenopathy:    She has no cervical adenopathy.  Neurological: She is alert.  Skin: Skin is warm and dry. No rash noted. No pallor.  Psychiatric: She has a normal mood and affect.          Assessment & Plan:   Problem List Items Addressed This Visit      Respiratory   Acute bronchitis    In pt with hx of GERD and frequent uri this year  Reassuring exam  Some reactive airways Disc symptomatic care - see instructions on AVS : Rest your voice as much as you can  Take the prednisone as directed Use albuterol inhaler up to every 4 hours for wheezing/tightness  Try the hycodan for cough when not working or driving Otherwise- use delsym       Allergic rhinitis    Continue pollen avoidance and flonase/astelin  And  xyzal

## 2017-09-19 NOTE — Telephone Encounter (Signed)
I spoke with pt; continues with prod cough with ? Clear phlegm; pt has not seen any color to the phlegm as far as she knows. Pt states she is wheezing; no difficulty breathing and no fever. [facility]12[facility]17[facility] [address]

## 2017-09-19 NOTE — Telephone Encounter (Signed)
Spoke with patient on phone, continues to have cough, worse at night, occasional wheeze, no fever, little sputum production, feels like cough originates at top of throat, not in chest. Currently on singulair, Xyzal, fluticasone spray and Astelin nasal spray.   Please call her and schedule with PCP.

## 2017-09-19 NOTE — Patient Instructions (Signed)
I think you have some viral bronchitis Drink lots of fluids  Get extra rest   Rest your voice as much as you can  Take the prednisone as directed Use albuterol inhaler up to every 4 hours for wheezing/tightness  Try the hycodan for cough when not working or driving Otherwise- use delsym   Update if not starting to improve in a week or if worsening

## 2017-09-19 NOTE — Telephone Encounter (Signed)
Copied from [place] [id number]. Topic: General - Other >> Sep 19, 2017  9:16 AM Conception Chancy, NT wrote: Reason for CRM: patient is calling and states that she was seen on 09/12/17 by Aurora Mask. Patient was told if she did not feel better by 09/16/17 to call back. Patient is calling in today 09/19/17 and states she still does not feel better and she has a continuous cough. Please advise.  [id number] [address], [place] [address] Phone: [phone] Fax: [phone]

## 2017-09-20 NOTE — Assessment & Plan Note (Signed)
In pt with hx of GERD and frequent uri this year  Reassuring exam  Some reactive airways Disc symptomatic care - see instructions on AVS : Rest your voice as much as you can  Take the prednisone as directed Use albuterol inhaler up to every 4 hours for wheezing/tightness  Try the hycodan for cough when not working or driving Otherwise- use delsym

## 2017-09-20 NOTE — Assessment & Plan Note (Signed)
Continue pollen avoidance and flonase/astelin  And xyzal

## 2017-09-21 NOTE — Telephone Encounter (Signed)
Pt is still no better with cough and congestion is much worse. Still clear phlegm. Pt was in the office on 09/12/17 and 09/19/17. Please advise.

## 2017-09-21 NOTE — Telephone Encounter (Signed)
Called and spoke with pt informing her that an appointment would be needed with her PCP. Patients states she will follow up with Dr.Bedsole. Nothing further needed at this time

## 2017-09-22 ENCOUNTER — Telehealth: Payer: Self-pay

## 2017-09-22 MED ORDER — FLUTICASONE FUROATE 100 MCG/ACT IN AEPB: 1.0000 | INHALATION_SPRAY | Freq: Every day | RESPIRATORY_TRACT | 5 refills | Status: DC

## 2017-09-22 NOTE — Telephone Encounter (Signed)
Called and spoke with and informed her of Dr. Jeralyn Ruths recommendation. Patient will start the [name] today and call us next week if it has not help.

## 2017-09-22 NOTE — Telephone Encounter (Signed)
Patient called stating that she is wheezing with every breath she takes. Patient stated she has been sick since March and now has bronchitis. Patient stated she isn't having trouble breathing just wheezing. Patient stated she is on prednisone and a cough syrup. Patient also stated she is using her albuterol inhaler. Patient was last seen in December and you recommended her to start arnuity however patient stated she never started it. Please advise.

## 2017-09-22 NOTE — Telephone Encounter (Signed)
Yes please have her start the Arnuity 1 puff daily.  Hopefully this will decrease/eliminate the wheeze and decrease need for albuterol.  Does she have this inhaler at home if not will need to resend script.   If she is still wheezing despite using Arnuity then would recommend OV.

## 2017-09-23 ENCOUNTER — Telehealth: Payer: Self-pay | Admitting: Family Medicine

## 2017-09-23 NOTE — Telephone Encounter (Signed)
Copied from [place] [id number]. Topic: Quick Communication - See Telephone Encounter >> Sep 23, 2017 10:12 AM Robina Ade, Helene Kelp D wrote: CRM for notification. See Telephone encounter for: 09/23/17. Patient called and said that she went to see an allergies provider and they gave her medication to start on. The medication is arnuity. She just wanted the provider know.

## 2017-09-26 ENCOUNTER — Ambulatory Visit: Payer: Self-pay | Admitting: *Deleted

## 2017-09-26 ENCOUNTER — Ambulatory Visit: Payer: BLUE CROSS/BLUE SHIELD | Admitting: Family Medicine

## 2017-09-26 NOTE — Telephone Encounter (Signed)
Pt still has cough and congestion after several weeks of treatment. She states that she has a hx of asthma and also wheezes. She is using an inhaler Arnutiy she got from her allergist.  She is also taking cough med but it is not helping much. She has a runny nose and productive cough of clear secretions. She stated that she did cough up blood once. Thinks it might have been throat irritation.  She is requesting an appointment with her provider. Appointment made per request.   Home care advice given with verbal understanding.  Reason for Disposition . Cough has been present for > 3 weeks  Answer Assessment - Initial Assessment Questions 1. ONSET: "When did the cough begin?"      Not sure 2. SEVERITY: "How bad is the cough today?"      Productive  3. RESPIRATORY DISTRESS: "Describe your breathing."      wheezing 4. FEVER: "Do you have a fever?" If so, ask: "What is your temperature, how was it measured, and when did it start?"     no 5. SPUTUM: "Describe the color of your sputum" (clear, white, yellow, green)     clear 6. HEMOPTYSIS: "Are you coughing up any blood?" If so ask: "How much?" (flecks, streaks, tablespoons, etc.)     Yes yesterday, the size of a nickel or quarter 7. CARDIAC HISTORY: "Do you have any history of heart disease?" (e.g., heart attack, congestive heart failure)      Family hx 8. LUNG HISTORY: "Do you have any history of lung disease?"  (e.g., pulmonary embolus, asthma, emphysema)     asthma 9. PE RISK FACTORS: "Do you have a history of blood clots?" (or: recent major surgery, recent prolonged travel, bedridden )     no 10. OTHER SYMPTOMS: "Do you have any other symptoms?" (e.g., runny nose, wheezing, chest pain)       Wheezing, runny nose 11. PREGNANCY: "Is there any chance you are pregnant?" "When was your last menstrual period?"       no 12. TRAVEL: "Have you traveled out of the country in the last month?" (e.g., travel history, exposures)       no  Protocols  used: [place]

## 2017-09-26 NOTE — Telephone Encounter (Signed)
Pt scheduled for 4/23

## 2017-09-26 NOTE — Telephone Encounter (Signed)
Already on medication list.

## 2017-09-27 ENCOUNTER — Encounter: Payer: Self-pay | Admitting: Family Medicine

## 2017-09-27 ENCOUNTER — Ambulatory Visit (INDEPENDENT_AMBULATORY_CARE_PROVIDER_SITE_OTHER): Payer: BLUE CROSS/BLUE SHIELD | Admitting: Family Medicine

## 2017-09-27 VITALS — BP 138/82 | HR 62 | Temp 98.0°F | Ht 64.0 in | Wt 182.5 lb

## 2017-09-27 DIAGNOSIS — R05 Cough: Secondary | ICD-10-CM

## 2017-09-27 DIAGNOSIS — Z9109 Other allergy status, other than to drugs and biological substances: Secondary | ICD-10-CM

## 2017-09-27 DIAGNOSIS — J209 Acute bronchitis, unspecified: Secondary | ICD-10-CM

## 2017-09-27 DIAGNOSIS — R053 Chronic cough: Secondary | ICD-10-CM

## 2017-09-27 NOTE — Progress Notes (Signed)
Subjective:    Patient ID: [name], female    DOB: Jan 22, 1951, 67 y.o.   MRN: [id number]  HPI   67 year old female pt with history of past allergies, reflux and reactive airway disease.  She saw NP Carlean Purl on 4/8 after several weeks of symptoms and abx  (augmentin and pred taper on 3/5) Lungs were clear and exam reassuring  Enc to stay on allergy medicines  rec f/u allergist and delsym for cough along with mucinex    Saw Dr. Glori Bickers on 09/19/2017 for acute bronchitis . Treated with  Cough syrup, albuterol and prednsione taper. otc - taking flonase  astelin ns  Delsym and xyzal  She also takes ppi for LPR (prevacid and pepcid)    Today she reports  gradual improvement in symptoms.. Still with cough, clearing throat, post nasal drip.  prednisone helped significantly.  Milds SOB,    She noted clear mucus, small amount blood once over weekend.. Dime size.    She talked with allergist.. Started her on [place] ( inhaled fluticasone)   She has  FMLA paper work.. She has been out of work since 09/19/2017.  return date planned 10/01/2017,   Review of Systems  Constitutional: Negative for fatigue and fever.  HENT: Negative for ear pain.   Eyes: Negative for pain.  Respiratory: Positive for cough and shortness of breath. Negative for chest tightness and wheezing.   Cardiovascular: Negative for chest pain, palpitations and leg swelling.  Gastrointestinal: Negative for abdominal pain.  Genitourinary: Negative for dysuria.       Objective:   Physical Exam  Constitutional: Vital signs are normal. She appears well-developed and well-nourished. She is cooperative.  Non-toxic appearance. She does not appear ill. No distress.  HENT:  Head: Normocephalic.  Right Ear: Hearing, tympanic membrane, external ear and ear canal normal. Tympanic membrane is not erythematous, not retracted and not bulging.  Left Ear: Hearing, tympanic membrane, external ear and ear canal normal.  Tympanic membrane is not erythematous, not retracted and not bulging.  Nose: Mucosal edema and rhinorrhea present. Right sinus exhibits no maxillary sinus tenderness and no frontal sinus tenderness. Left sinus exhibits no maxillary sinus tenderness and no frontal sinus tenderness.  Mouth/Throat: Uvula is midline, oropharynx is clear and moist and mucous membranes are normal.  Eyes: Pupils are equal, round, and reactive to light. Conjunctivae, EOM and lids are normal. Lids are everted and swept, no foreign bodies found.  Neck: Trachea normal and normal range of motion. Neck supple. Carotid bruit is not present. No thyroid mass and no thyromegaly present.  Cardiovascular: Normal rate, regular rhythm, S1 normal, S2 normal, normal heart sounds, intact distal pulses and normal pulses. Exam reveals no gallop and no friction rub.  No murmur heard. Pulmonary/Chest: Effort normal and breath sounds normal. No tachypnea. No respiratory distress. She has no decreased breath sounds. She has no wheezes. She has no rhonchi. She has no rales.   occ dry cough,  Only wheeze is forced expiratory wheeze with upper airway. Lungs are clear  Neurological: She is alert.  Skin: Skin is warm, dry and intact. No rash noted.  Psychiatric: Her speech is normal and behavior is normal. Judgment normal. Her mood appears not anxious. Cognition and memory are normal. She does not exhibit a depressed mood.          Assessment & Plan:   Bronchitis, reactive airways/asthma causing persistent cough in response to allergies.   No sign of infection at  this point. Need time and complete prednisone. Agree with addition of arnuity for inhaled steroid.

## 2017-09-27 NOTE — Patient Instructions (Signed)
Complete prednsione. Continue Arnuity.  Planned return to work 10/01/2017

## 2017-09-28 ENCOUNTER — Telehealth: Payer: Self-pay | Admitting: Family Medicine

## 2017-09-28 NOTE — Telephone Encounter (Signed)
Copied from [place]. Topic: Quick Communication - See Telephone Encounter >> Sep 28, 2017 11:07 AM Rutherford Nail, NT wrote: CRM for notification. See Telephone encounter for: 09/28/17. Lori from [employer] calling to find out when patient was written out of work. States patient has filed a short term disability claim. She is requesting visit summary for her 09/19/17 and 09/27/17 visit. Fax#: [phone] claim #: [id number]  Please advise.

## 2017-09-28 NOTE — Telephone Encounter (Signed)
Spoke with Sharyn Lull @ [employer]  She will send medical records release form to request records

## 2017-09-30 ENCOUNTER — Ambulatory Visit: Payer: Self-pay | Admitting: *Deleted

## 2017-09-30 NOTE — Telephone Encounter (Signed)
Patient calling with update from her visit with Dr. Diona Browner on 4/23. She continues to cough along with some clear nasal discharge during the day. She reports wheezing on exhale and is on Arnuity for asthma. Suggested some home care advice and to phone back with any worsening symptoms.   Additional Information . Commented on: All Negative - See Physician Within 4 Hours (or PCP Triage)    Wheezing on exhale, this is not new.  Answer Assessment - Initial Assessment Questions 1. ONSET: "When did the cough begin?"  Cough is worse, she has coughing spells. Cough medicine helps some. She is almost out.  2. SEVERITY: "How bad is the cough today?"     Feels fatigued from all the coughing.  3. RESPIRATORY DISTRESS: "Describe your breathing."    Wheeze with exhale.  4. FEVER: "Do you have a fever?" If so, ask: "What is your temperature, how was it measured, and when did it start?" 99.5 5. SPUTUM: "Describe the color of your sputum" (clear, white, yellow, green)   Dried discharge in the morning upon walking. Clear nasal discharge during the day. 6. HEMOPTYSIS: "Are you coughing up any blood?" If so ask: "How much?" (flecks, streaks, tablespoons, etc.)  none since last visit. 7. CARDIAC HISTORY: "Do you have any history of heart disease?" (e.g., heart attack, congestive heart failure)     8. LUNG HISTORY: "Do you have any history of lung disease?"  (e.g., pulmonary embolus, asthma, emphysema)  asthma. She is on Arnuity. 9. PE RISK FACTORS: "Do you have a history of blood clots?" (or: recent major surgery, recent prolonged travel, bedridden )    no 10. OTHER SYMPTOMS: "Do you have any other symptoms?" (e.g., runny nose, wheezing, chest pain)      Wheeze on exhale. 11. PREGNANCY: "Is there any chance you are pregnant?" "When was your last menstrual period?"     no 12. TRAVEL: "Have you traveled out of the country in the last month?" (e.g., travel history, exposures)      no  Protocols used: [place]

## 2017-10-06 ENCOUNTER — Telehealth: Payer: Self-pay

## 2017-10-06 ENCOUNTER — Telehealth: Payer: Self-pay | Admitting: Family Medicine

## 2017-10-06 NOTE — Telephone Encounter (Signed)
Noted. Okay to return to work as requested.

## 2017-10-06 NOTE — Telephone Encounter (Signed)
Resending to correct office ([place])

## 2017-10-06 NOTE — Telephone Encounter (Signed)
See 09/12/17 phone note.

## 2017-10-06 NOTE — Telephone Encounter (Signed)
Copied from [place] [id number]. Topic: General - Other >> Oct 06, 2017  2:59 PM Mcneil, Jacinto Reap wrote: Pt called and stated that she has been out of work but feels she can return on this Saturday 10/08/17. She requested that Dr. Diona Browner nurse return her call.   Cb# [phone]

## 2017-10-06 NOTE — Telephone Encounter (Signed)
Copied from [place] [id number]. Topic: Inquiry >> Oct 06, 2017  3:09 PM Margot Ables wrote: Reason for CRM: Amy called to ask why the patients return to work date was changed from 09/10/17 to 09/13/17. She is asking for additional documentation/information from 09/12/17 OV with Clarene Reamer, NP. Please call to advise for pts short term disability claim. Claim # [id number]

## 2017-10-06 NOTE — Telephone Encounter (Signed)
This is not our pt

## 2017-10-06 NOTE — Telephone Encounter (Signed)
Spoke with [name].  She states her congestion is gone and her voice came back yesterday.  She does complain that her throat is sore.  I advised that she can take 800 mg of ibuprofen for the sore throat.    She feels she can return to work on Saturday 10/08/2017 and ask that I let Shirlean Mylar know who has been handling her disability claim.

## 2017-10-10 NOTE — Telephone Encounter (Signed)
Left message asking [name] @ [phone] ext [phone]

## 2017-10-10 NOTE — Telephone Encounter (Signed)
melife said did fax forms

## 2017-10-10 NOTE — Telephone Encounter (Signed)
Spoke with pt she was out of work from 4/13 to 5/2 She is aware I don't have any paperwork and she will call [employer] and have them fax paperwork

## 2017-10-10 NOTE — Telephone Encounter (Signed)
Left message asking pt to call office  °

## 2017-10-10 NOTE — Telephone Encounter (Signed)
Spoke to amy @ [employer] Gave her verbal information for reason pt is out of work I do not need to send paperwork

## 2017-10-10 NOTE — Telephone Encounter (Signed)
Medical release sent to [employer] 4/26 to be copied and mailed

## 2017-10-29 ENCOUNTER — Other Ambulatory Visit: Payer: Self-pay | Admitting: Family Medicine

## 2017-11-04 ENCOUNTER — Telehealth: Payer: Self-pay | Admitting: Family Medicine

## 2017-11-04 MED ORDER — FAMOTIDINE 40 MG PO TABS: 20.0000 mg | ORAL_TABLET | Freq: Two times a day (BID) | ORAL | 3 refills | Status: DC

## 2017-11-04 NOTE — Telephone Encounter (Signed)
That is fine.. Please sen in new rx.

## 2017-11-04 NOTE — Telephone Encounter (Signed)
Copied from [place] [id number]. Topic: Quick Communication - See Telephone Encounter >> Nov 04, 2017  1:18 PM Rutherford Nail, [place] wrote: CRM for notification. See Telephone encounter for: 11/04/17. Patient calling and states that the insurance will not cover the famotidine (PEPCID) 20 MG tablet, but they will cover the 40mg . States that it would take the price from $90 to $4. Would like to know if it is possible to get it medication changed to 40mg  and cut the pill in half to make 20mg ? Please advise. CB#: [phone] [id number] [address], [place]

## 2017-11-04 NOTE — Telephone Encounter (Signed)
Ms. Wishon notified that prescription has been sent to her pharmacy as requested.

## 2017-11-11 ENCOUNTER — Encounter: Payer: Self-pay | Admitting: Gynecology

## 2017-11-11 ENCOUNTER — Inpatient Hospital Stay: Payer: BLUE CROSS/BLUE SHIELD | Attending: Gynecology | Admitting: Gynecology

## 2017-11-11 VITALS — BP 155/79 | HR 60 | Temp 98.8°F | Resp 20 | Ht 64.0 in | Wt 188.7 lb

## 2017-11-11 DIAGNOSIS — C4499 Other specified malignant neoplasm of skin, unspecified: Secondary | ICD-10-CM

## 2017-11-11 DIAGNOSIS — Z87891 Personal history of nicotine dependence: Secondary | ICD-10-CM | POA: Diagnosis not present

## 2017-11-11 DIAGNOSIS — C519 Malignant neoplasm of vulva, unspecified: Secondary | ICD-10-CM | POA: Insufficient documentation

## 2017-11-11 DIAGNOSIS — Z90710 Acquired absence of both cervix and uterus: Secondary | ICD-10-CM | POA: Diagnosis not present

## 2017-11-11 NOTE — Progress Notes (Signed)
Consult Note: Gyn-Onc   [name] 67 y.o. female  Chief Complaint  Patient presents with  . Paget's disease of vulva    Assessment : Paget's disease of the vulva  . No evidence recurrent disease. Plan:    Patient return in 6 months.  .  Interval History:    Patient  returns today as previously scheduled. Since her last visit she's done well. Specifically she denies any vulvar symptoms, except for very occasional pruritus in the upper aspect of the right vulva.  She denies any urinary incontinence and has no other GI GU or pelvic symptoms.   HPI:The patient initially underwent surgery for Paget's disease of the vulva in October 2011. There was focal involvement of the medial margin. The entire lesion is approximately 5 cm in diameter.   The patient previously had a supracervical hysterectomy but has her ovaries and cervix in situ.  She notes that she had some repair done at the time of her hysterectomy and at her introitus is tight and she has difficulty having intercourse and also has tearing. On the other hand her husband has had prostate cancer surgery making intercourse difficult as well.  Patient underwent wide local excision of a recurrent lesion on August 50,016 while all gross lesion was removed she did have positive surgical margins that were focal. Subsequently she underwent a another excision on 04/10/2015 with negative margins.     Review of Systems:10 point review of systems is negative except as noted in interval history.   Vitals: Blood pressure (!) 155/79, pulse 60, temperature 98.8 F (37.1 C), temperature source Oral, resp. rate 20, height 5' 4"  (1.626 m), weight 188 lb 11.2 oz (85.6 kg), SpO2 98 %.  Physical Exam: General : The patient is a healthy woman in no acute distress.  HEENT: normocephalic, extraoccular movements normal; neck is supple without thyromegally  Lynphnodes: Supraclavicular and inguinal nodes not enlarged  Abdomen: Soft, non-tender, no  ascites, no organomegally, no masses, no hernias  Pelvic:  EGBUS: Normal female there is a defect in the right vulva consistent with prior excision of the labia minora..  No lesions are noted.    Lower extremities: No edema or varicosities. Normal range of motion   Allergies  Allergen Reactions  . Ibuprofen Hypertension  . Naprosyn [Naproxen] Nausea Only  . Other     Allergic to toothpaste- makes mouth peel Hay fever/dust mites/trees "365 allergic"   . Azithromycin Itching and Rash  . Sulfa Antibiotics Hives and Rash    Past Medical History:  Diagnosis Date  . Allergic rhinitis   . Arthritis   . GERD (gastroesophageal reflux disease)   . Headache(784.0)    Migraines  . History of adenomatous polyp of colon   . History of concussion    AS CHILD--  NO RESIDUAL  . History of palpitations   . Hypertension   . Paget's disease of vulva   . PONV (postoperative nausea and vomiting)    SEVERE  . Wears glasses     Past Surgical History:  Procedure Laterality Date  . mucoid cyst removal thumb    . 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 PULLEY RELEASE RIGHT THUMB  03-23-2011  . SIMPLE VULVECTOMY  03-24-2010   right side  . TONSILLECTOMY  1977  . TRIGGER FINGER RELEASE    . VAGINAL HYSTERECTOMY  1989   and Anterior and posterior repair's for  prolapse  . VULVECTOMY Right 01/10/2015   Procedure: RIGHT WIDE LOCAL EXCISION VULVECTOMY;  Surgeon: [name], MD;  Location: [facility];  Service: Gynecology;  Laterality: Right;  Marland Kitchen VULVECTOMY Right 04/10/2015   Procedure: WIDE LOCAL EXCISION VULVA;  Surgeon: [name], MD;  Location: [facility];  Service: Gynecology;  Laterality: Right;  . WISDOM TOOTH EXTRACTION      Current Outpatient Medications  Medication Sig Dispense Refill  . albuterol (PROVENTIL HFA;VENTOLIN HFA) 108 (90 Base) MCG/ACT  inhaler Inhale 2 puffs into the lungs every 6 (six) hours as needed. Reported on 10/24/2015 1 Inhaler 0  . azelastine (ASTELIN) 0.1 % nasal spray USE 1 SPRAY IN EACH NOSTRIL TWICE A DAY AS DIRECTED 90 mL 1  . clobetasol cream (TEMOVATE) 5.32 % Apply 1 application topically 2 (two) times daily. To the vulva 30 g 0  . famotidine (PEPCID) 40 MG tablet Take 0.5 tablets (20 mg total) by mouth 2 (two) times daily. 90 tablet 3  . lansoprazole (PREVACID) 30 MG capsule Take 30 mg by mouth 2 (two) times daily before a meal.     . levocetirizine (XYZAL) 5 MG tablet Take 5 mg by mouth every evening.    Marland Kitchen losartan-hydrochlorothiazide (HYZAAR) 100-25 MG tablet Take 1 tablet by mouth every morning. 90 tablet 0  . metoprolol tartrate (LOPRESSOR) 25 MG tablet TAKE 1/4 TABLET TWICE A DAY 45 tablet 1  . montelukast (SINGULAIR) 10 MG tablet TAKE 1 TABLET EVERY MORNING 90 tablet 3  . OVER THE COUNTER MEDICATION [employer] take 2 tablets by mouth at bedtime.    . Triamcinolone & Emollient (DERMASORB TA) 0.1 % KIT   0  . triamcinolone ointment (KENALOG) 0.5 % Apply 1 application topically 2 (two) times daily. 30 g 1  . HYDROcodone-homatropine (HYCODAN) 5-1.5 MG/5ML syrup Take 5 mLs by mouth every 8 (eight) hours as needed for cough. Caution of sedation -don't take when working or driving (Patient not taking: Reported on 11/11/2017) 120 mL 0   No current facility-administered medications for this visit.     Social History   Socioeconomic History  . Marital status: Married    Spouse name: Not on file  . Number of children: Not on file  . Years of education: Not on file  . Highest education level: Not on file  Occupational History  . Not on file  Social Needs  . Financial resource strain: Not on file  . Food insecurity:    Worry: Not on file    Inability: Not on file  . Transportation needs:    Medical: Not on file    Non-medical: Not on file  Tobacco Use  . Smoking status: Former Smoker    Years: 5.00     Types: Cigarettes    Last attempt to quit: 04/15/1989    Years since quitting: 28.5  . Smokeless tobacco: Never Used  Substance and Sexual Activity  . Alcohol use: Yes    Alcohol/week: 0.6 oz    Types: 1 Glasses of wine per week    Comment: occasional wine  . Drug use: No  . Sexual activity: Not on file  Lifestyle  . Physical activity:    Days per week: Not on file    Minutes per session: Not on file  . Stress: Not on file  Relationships  . Social connections:    Talks on phone: Not on file    Gets together: Not on file    Attends  religious service: Not on file    Active member of club or organization: Not on file    Attends meetings of clubs or organizations: Not on file    Relationship status: Not on file  . Intimate partner violence:    Fear of current or ex partner: Not on file    Emotionally abused: Not on file    Physically abused: Not on file    Forced sexual activity: Not on file  Other Topics Concern  . Not on file  Social History Narrative  . Not on file    Family History  Problem Relation Age of Onset  . Cancer Maternal Grandmother   . Hypertension Father   . Heart disease Father   . Asthma Father   . Hypertension Mother   . Heart disease Mother   . Breast cancer Paternal Grandmother 28  . Colon cancer Paternal Uncle   . Allergic rhinitis Neg Hx   . Angioedema Neg Hx   . Eczema Neg Hx   . Immunodeficiency Neg Hx   . Urticaria Neg Hx       [name], MD 11/11/2017, 12:04 PM

## 2017-11-11 NOTE — Patient Instructions (Signed)
Plan on following up in six months or sooner if needed.  Please call for any questions, concerns, or new symptoms.

## 2017-12-07 ENCOUNTER — Telehealth: Payer: Self-pay | Admitting: Internal Medicine

## 2017-12-07 ENCOUNTER — Ambulatory Visit: Payer: BLUE CROSS/BLUE SHIELD | Admitting: Family Medicine

## 2017-12-07 NOTE — Telephone Encounter (Signed)
Pt having some issues with her stomach. I just made an appt with Dr. Carlean Purl on 02/13/18. Offered with APP but declined and requested to speak with a nurse.

## 2017-12-07 NOTE — Telephone Encounter (Signed)
Patient does not wish to see an APP.  She is advised that I do not have any earlier appts with Dr. Carlean Purl. She is advised that she can see her primary care until OV in September.

## 2017-12-13 ENCOUNTER — Other Ambulatory Visit: Payer: Self-pay | Admitting: Family Medicine

## 2017-12-13 ENCOUNTER — Telehealth: Payer: Self-pay | Admitting: Family Medicine

## 2017-12-13 ENCOUNTER — Ambulatory Visit (INDEPENDENT_AMBULATORY_CARE_PROVIDER_SITE_OTHER): Payer: BLUE CROSS/BLUE SHIELD | Admitting: Family Medicine

## 2017-12-13 ENCOUNTER — Encounter: Payer: Self-pay | Admitting: Family Medicine

## 2017-12-13 VITALS — BP 116/64 | HR 57 | Temp 98.5°F | Ht 64.0 in | Wt 188.8 lb

## 2017-12-13 DIAGNOSIS — R413 Other amnesia: Secondary | ICD-10-CM

## 2017-12-13 DIAGNOSIS — R39198 Other difficulties with micturition: Secondary | ICD-10-CM | POA: Insufficient documentation

## 2017-12-13 DIAGNOSIS — R109 Unspecified abdominal pain: Secondary | ICD-10-CM

## 2017-12-13 DIAGNOSIS — R1902 Left upper quadrant abdominal swelling, mass and lump: Secondary | ICD-10-CM | POA: Insufficient documentation

## 2017-12-13 LAB — POC URINALSYSI DIPSTICK (AUTOMATED)
BILIRUBIN UA: NEGATIVE
Blood, UA: NEGATIVE
Glucose, UA: NEGATIVE
KETONES UA: NEGATIVE
LEUKOCYTES UA: NEGATIVE
Nitrite, UA: NEGATIVE
Protein, UA: NEGATIVE
SPEC GRAV UA: 1.02 (ref 1.010–1.025)
Urobilinogen, UA: 0.2 E.U./dL
pH, UA: 6 (ref 5.0–8.0)

## 2017-12-13 LAB — VITAMIN D 25 HYDROXY (VIT D DEFICIENCY, FRACTURES): VITD: 20.53 ng/mL — AB (ref 30.00–100.00)

## 2017-12-13 LAB — T3, FREE: T3, Free: 3.1 pg/mL (ref 2.3–4.2)

## 2017-12-13 LAB — TSH: TSH: 1.39 u[IU]/mL (ref 0.35–4.50)

## 2017-12-13 LAB — T4, FREE: Free T4: 0.89 ng/dL (ref 0.60–1.60)

## 2017-12-13 MED ORDER — VITAMIN D3 1.25 MG (50000 UT) PO CAPS: 1.0000 | ORAL_CAPSULE | ORAL | 0 refills | Status: DC

## 2017-12-13 NOTE — Telephone Encounter (Signed)
Lansoprazole (Prevacid) refill Last Refilled by historical provider Last OV: 12/13/17 PCP: Dr. Diona Browner [facility] [address]

## 2017-12-13 NOTE — Progress Notes (Signed)
   Subjective:    Patient ID: [name], female    DOB: August 18, 1950, 67 y.o.   MRN: [id number]  HPI 67 year old female presents today with multiple issues  1. She has noted bulging in left upper abdomen x 3 months. Increasing in  Size. Uncomfortable at times.. No pain.  Occ in and needle pain feeling or thumping. No C/D, no N/V.  no fever.  2. Has noted trouble getting urine out, trickling.. But not every time she urinates.  feels like she most often is emptying completely. Since her hysterectomy she has had lean forward to get urine out.  No dysuria. No blood in urine. No incontinence except with cough and sneeze hard  Feels urgency then cannot go.  She has been tired frequently.  3. Memory issues.. Has missed some appointments. Ongoing x last few months. Father with memory issues.  Hx of hysterectomy in 1989   Review of Systems  Constitutional: Positive for fatigue. Negative for fever.  HENT: Negative for congestion.   Eyes: Negative for pain.  Respiratory: Negative for cough and shortness of breath.   Cardiovascular: Negative for chest pain, palpitations and leg swelling.  Gastrointestinal: Positive for abdominal pain.  Genitourinary: Negative for dysuria and vaginal bleeding.  Musculoskeletal: Negative for back pain.  Neurological: Negative for syncope, light-headedness and headaches.  Psychiatric/Behavioral: Negative for dysphoric mood.       Objective:   Physical Exam  Constitutional: Vital signs are normal. She appears well-developed and well-nourished. She is cooperative.  Non-toxic appearance. She does not appear ill. No distress.  HENT:  Head: Normocephalic.  Right Ear: Hearing, tympanic membrane, external ear and ear canal normal. Tympanic membrane is not erythematous, not retracted and not bulging.  Left Ear: Hearing, tympanic membrane, external ear and ear canal normal. Tympanic membrane is not erythematous, not retracted and not bulging.  Nose: No  mucosal edema or rhinorrhea. Right sinus exhibits no maxillary sinus tenderness and no frontal sinus tenderness. Left sinus exhibits no maxillary sinus tenderness and no frontal sinus tenderness.  Mouth/Throat: Uvula is midline, oropharynx is clear and moist and mucous membranes are normal.  Eyes: Pupils are equal, round, and reactive to light. Conjunctivae, EOM and lids are normal. Lids are everted and swept, no foreign bodies found.  Neck: Trachea normal and normal range of motion. Neck supple. Carotid bruit is not present. No thyroid mass and no thyromegaly present.  Cardiovascular: Normal rate, regular rhythm, S1 normal, S2 normal, normal heart sounds, intact distal pulses and normal pulses. Exam reveals no gallop and no friction rub.  No murmur heard. Pulmonary/Chest: Effort normal and breath sounds normal. No tachypnea. No respiratory distress. She has no decreased breath sounds. She has no wheezes. She has no rhonchi. She has no rales.  Abdominal: Soft. Normal appearance and bowel sounds are normal. There is no tenderness.  No noticeable bulging in abdomen per examiner, pt subjectively feels there is a bulge... possible slightly more fatty tissue at area of concern.  Neurological: She is alert.  Skin: Skin is warm, dry and intact. No rash noted.  Psychiatric: Her speech is normal and behavior is normal. Judgment and thought content normal. Her mood appears not anxious. Cognition and memory are normal. She does not exhibit a depressed mood.    MMSE today in office 29/30 ( missed date) Nml clock drawing  animal recall 10 in 30 seconds       Assessment & Plan:

## 2017-12-13 NOTE — Telephone Encounter (Signed)
Copied from [place] [id number]. Topic: Quick Communication - Rx Refill/Question >> Dec 13, 2017  1:38 PM Cecelia Byars, NT wrote: Medication: lansoprazole (PREVACID) 30 MG capsule  Has the patient contacted their pharmacy?  no: (Agent: If no, request that the patient contact the pharmacy for the refill. (Agent: If yes, when and what did the pharmacy advise?  Preferred Pharmacy (with phone number or street name): [id number] [address], [place] [phone] (Phone) [phone] (Fax)      Agent: Please be advised that RX refills may take up to 3 business days. We ask that you follow-up with your pharmacy.

## 2017-12-13 NOTE — Patient Instructions (Addendum)
Please stop at the lab to have labs drawn.  Can try OTC probiotic.  Call  For [place] of abdomen if pressure in left upper abdomen not improving.

## 2017-12-14 NOTE — Telephone Encounter (Signed)
Lansoprazole last refilled # 90 x 3 10/25/2012. Pt last seen 12/13/17. Please advise.

## 2017-12-15 MED ORDER — LANSOPRAZOLE 30 MG PO CPDR: 30.0000 mg | DELAYED_RELEASE_CAPSULE | Freq: Two times a day (BID) | ORAL | 1 refills | Status: DC

## 2017-12-15 NOTE — Telephone Encounter (Signed)
Okay to refill? 

## 2017-12-15 NOTE — Telephone Encounter (Signed)
[facility] [address] [facility] [place].

## 2017-12-23 ENCOUNTER — Telehealth: Payer: Self-pay | Admitting: Family Medicine

## 2017-12-23 DIAGNOSIS — R1012 Left upper quadrant pain: Secondary | ICD-10-CM

## 2017-12-23 NOTE — Telephone Encounter (Signed)
Patient states that she continues to feel uncomfortable since her last OV with a constant feeling of increasing fullness.  Denies pain except for in her back area but overall symptoms are not worse, they just continue.  She denies any fever, N/V or changes to her bowel/urinary patterns.    Please let her know when we have made the referral for her ultrasound.  Thanks.

## 2017-12-23 NOTE — Telephone Encounter (Signed)
Copied from [place] [phone] [phone]. Topic: Quick Communication - See Telephone Encounter >> Dec 23, 2017  2:44 PM Bea Graff, NT wrote: CRM for notification. See Telephone encounter for: 12/23/17. Pt calling and would like and order for an [place] of her stomach area under her left rib. States she had spoken to Dr. Diona Browner regarding the issue at her last appointment.

## 2017-12-27 NOTE — Telephone Encounter (Signed)
Called and notified patient understanding verbalized nothing further needed at this time.

## 2017-12-27 NOTE — Telephone Encounter (Signed)
Notify pt referral sent.

## 2017-12-30 ENCOUNTER — Ambulatory Visit
Admission: RE | Admit: 2017-12-30 | Discharge: 2017-12-30 | Disposition: A | Discharge status: Home / Self Care | Payer: BLUE CROSS/BLUE SHIELD | Source: Ambulatory Visit | Attending: Family Medicine | Admitting: Family Medicine

## 2017-12-30 DIAGNOSIS — R932 Abnormal findings on diagnostic imaging of liver and biliary tract: Secondary | ICD-10-CM | POA: Insufficient documentation

## 2017-12-30 DIAGNOSIS — R1012 Left upper quadrant pain: Secondary | ICD-10-CM

## 2018-01-09 ENCOUNTER — Telehealth: Payer: Self-pay

## 2018-01-09 ENCOUNTER — Ambulatory Visit (INDEPENDENT_AMBULATORY_CARE_PROVIDER_SITE_OTHER): Payer: Medicare Other | Admitting: Family Medicine

## 2018-01-09 ENCOUNTER — Encounter: Payer: Self-pay | Admitting: Family Medicine

## 2018-01-09 VITALS — BP 118/68 | HR 70 | Temp 98.2°F | Ht 64.0 in | Wt 187.5 lb

## 2018-01-09 DIAGNOSIS — R1012 Left upper quadrant pain: Secondary | ICD-10-CM | POA: Diagnosis not present

## 2018-01-09 DIAGNOSIS — M9901 Segmental and somatic dysfunction of cervical region: Secondary | ICD-10-CM | POA: Diagnosis not present

## 2018-01-09 DIAGNOSIS — M5413 Radiculopathy, cervicothoracic region: Secondary | ICD-10-CM | POA: Diagnosis not present

## 2018-01-09 DIAGNOSIS — M9902 Segmental and somatic dysfunction of thoracic region: Secondary | ICD-10-CM | POA: Diagnosis not present

## 2018-01-09 DIAGNOSIS — R51 Headache: Secondary | ICD-10-CM | POA: Diagnosis not present

## 2018-01-09 DIAGNOSIS — K219 Gastro-esophageal reflux disease without esophagitis: Secondary | ICD-10-CM | POA: Diagnosis not present

## 2018-01-09 DIAGNOSIS — R519 Headache, unspecified: Secondary | ICD-10-CM

## 2018-01-09 DIAGNOSIS — M5417 Radiculopathy, lumbosacral region: Secondary | ICD-10-CM | POA: Diagnosis not present

## 2018-01-09 MED ORDER — CYCLOBENZAPRINE HCL 5 MG PO TABS: 5.0000 mg | ORAL_TABLET | Freq: Two times a day (BID) | ORAL | 0 refills | Status: DC | PRN

## 2018-01-09 MED ORDER — LANSOPRAZOLE 15 MG PO CPDR: 15.0000 mg | DELAYED_RELEASE_CAPSULE | Freq: Two times a day (BID) | ORAL | Status: DC

## 2018-01-09 NOTE — Telephone Encounter (Signed)
PLEASE NOTE: All timestamps contained within this report are represented as [place] Standard Time. CONFIDENTIALTY NOTICE: This fax transmission is intended only for the addressee. It contains information that is legally privileged, confidential or otherwise protected from use or disclosure. If you are not the intended recipient, you are strictly prohibited from reviewing, disclosing, copying using or disseminating any of this information or taking any action in reliance on or regarding this information. If you have received this fax in error, please notify us immediately by telephone so that we can arrange for its return to [place]. Phone: [phone], Toll-Free: [phone], Fax: [phone] Page: 1 of 2 Call Id: [id number] [place] Patient Name: [name] Gender: Female DOB: May 01, 1951 Age: 67 Y 11 M 27 D Return Phone Number: [phone] (Primary), [phone] (Secondary) Address: City/State/Zip: [place] [address] Client [facility] Night - Client Client Site [place] Physician Eliezer Lofts - MD Contact Type Call Who Is Calling Patient / Member / Family / Caregiver Call Type Triage / Clinical Relationship To Patient Self Return Phone Number [phone] (Primary) Chief Complaint Abdominal Pain Reason for Call Symptomatic / Request for [place] states she has abdominal cramps Translation No Nurse Assessment Nurse: White, Therapist, sports, Doug Date/Time (Eastern Time): 01/08/2018 2:05:09 PM Confirm and document reason for call. If symptomatic, describe symptoms. ---Caller states she is having abdominal pain, worse when she bends over. She describes it as "muscle cramps". Recently seen by her PCP for this issue with no definitive diagnosis. Currently she is pain free. Does the patient have any new or worsening  symptoms? ---Yes Will a triage be completed? ---Yes Related visit to physician within the last 2 weeks? ---Yes Does the PT have any chronic conditions? (i.e. diabetes, asthma, etc.) ---Yes List chronic conditions. ---asthma. Is this a behavioral health or substance abuse call? ---No Guidelines Guideline Title Affirmed Question Affirmed Notes Nurse Date/Time (Eastern Time) Abdominal Pain - Female Abdominal pain is a chronic symptom (recurrent or ongoing AND present > 4 weeks) White, RN, Doug 01/08/2018 2:06:07 PM Disp. Time Eilene Ghazi Time) Disposition Final User 01/08/2018 2:18:18 PM See PCP within 2 Weeks Yes White, RN, Animator Understands Yes PLEASE NOTE: All timestamps contained within this report are represented as [place] Standard Time. CONFIDENTIALTY NOTICE: This fax transmission is intended only for the addressee. It contains information that is legally privileged, confidential or otherwise protected from use or disclosure. If you are not the intended recipient, you are strictly prohibited from reviewing, disclosing, copying using or disseminating any of this information or taking any action in reliance on or regarding this information. If you have received this fax in error, please notify us immediately by telephone so that we can arrange for its return to [place]. Phone: [phone], Toll-Free: [phone], Fax: [phone] Page: 2 of 2 Call Id: [id number] PreDisposition Did not know what to do Care Advice Given Per Guideline SEE PCP WITHIN 2 WEEKS: * You need to be seen for this ongoing problem within the next 2 weeks. Call your doctor (or NP/ PA) during regular office hours and make an appointment. REASSURANCE AND EDUCATION: It doesn't sound like a serious stomachache, but recurrent abdominal pains deserve a complete medical checkup. REST: * Lie down and rest until you feel better. PAIN DIARY: Keep a pain diary. Include the date, time, place, what  you were doing at the  time, severity, duration, what helps, etc. (Reason: try to find some of the triggers.) CALL BACK IF: * Severe pain lasts over 1 hour * Constant pain lasts over 2 hours * Intermittent pains (e.g., comes and goes, cramps) lasts over 48 hours * You become worse. CARE ADVICE given per Abdominal Pain, Female (Adult) guideline. Referrals REFERRED TO PCP OFFICE

## 2018-01-09 NOTE — Patient Instructions (Signed)
Trial muscle relaxant for possible abdominal wall strain.  May also use heating pad, but not directly on the skin.  Let us know if not improving with this.

## 2018-01-09 NOTE — Telephone Encounter (Signed)
I spoke with pt; when pt bends over has stomach cramps with pain level of 10. Pain comes and goes. Pt scheduled appt with Dr Darnell Level today at 2:45. Pt had [place] of abd on 12/30/17.

## 2018-01-09 NOTE — Progress Notes (Signed)
BP 118/68 (BP Location: Left Arm, Patient Position: Sitting, Cuff Size: Normal)   Pulse 70   Temp 98.2 F (36.8 C) (Oral)   Ht 5' 4"  (1.626 m)   Wt 187 lb 8 oz (85 kg)   SpO2 97%   BMI 32.18 kg/m    CC: headache, L abd pain/fullness Subjective:    Patient ID: [name], female    DOB: 1951/01/04, 67 y.o.   MRN: [id number]  HPI: [name] is a 67 y.o. female presenting on 01/09/2018 for Abdominal Pain (C/o abd pain when bending forward at the waist. Pain is sharp and located on left side. Started about 3 wks ago. Had recent US. ) and Headache (C/o of HA on right side of head when coughing.  Started a few days ago. Not sure if sinus related. )   3 wk h/o pain/fullness at left upper quadrant described as sharp muscle cramps worse with certain movements like twisting or bending over. Pain up to 10/10. No boring pain to back. No cough, dyspnea. Denies new workout routine. She did recently join [employer].   Saw PCP last month for similar concerns. Abd [place] last week was unrevealing - except for increased hepatic echogenicity consistent with fatty infiltration or hepatocellular disease.   Taking prevacid 59m bid with good control of GERD.   S/p supracervical hysterectomy, ovaries and cervix remain.  Also endorses constant right sided temple pain worse with coughing, present over last 2 days. Treating with tylenol without benefit. No vision changes.  Sister with h/o brain aneurysm at age 620yo- no one else in fam.   Relevant past medical, surgical, family and social history reviewed and updated as indicated. Interim medical history since our last visit reviewed. Allergies and medications reviewed and updated. Outpatient Medications Prior to Visit  Medication Sig Dispense Refill  . albuterol (PROVENTIL HFA;VENTOLIN HFA) 108 (90 Base) MCG/ACT inhaler Inhale 2 puffs into the lungs every 6 (six) hours as needed. Reported on 10/24/2015 1 Inhaler 0  . azelastine (ASTELIN) 0.1 % nasal spray USE 1  SPRAY IN EACH NOSTRIL TWICE A DAY AS DIRECTED 90 mL 1  . Cholecalciferol (VITAMIN D3) 50000 units CAPS Take 1 capsule by mouth once a week. 12 capsule 0  . clobetasol cream (TEMOVATE) 04.27% Apply 1 application topically 2 (two) times daily. To the vulva 30 g 0  . famotidine (PEPCID) 40 MG tablet Take 0.5 tablets (20 mg total) by mouth 2 (two) times daily. 90 tablet 3  . IRON PO Take by mouth daily.    .Marland Kitchenlevocetirizine (XYZAL) 5 MG tablet Take 5 mg by mouth every evening.    .Marland Kitchenlosartan-hydrochlorothiazide (HYZAAR) 100-25 MG tablet Take 1 tablet by mouth every morning. 90 tablet 0  . metoprolol tartrate (LOPRESSOR) 25 MG tablet TAKE 1/4 TABLET TWICE A DAY 45 tablet 1  . montelukast (SINGULAIR) 10 MG tablet TAKE 1 TABLET EVERY MORNING 90 tablet 3  . OVER THE COUNTER MEDICATION American Dream Z take 2 tablets by mouth at bedtime.    . Triamcinolone & Emollient (DERMASORB TA) 0.1 % KIT   0  . triamcinolone ointment (KENALOG) 0.5 % Apply 1 application topically 2 (two) times daily. 30 g 1  . lansoprazole (PREVACID) 30 MG capsule Take 1 capsule (30 mg total) by mouth 2 (two) times daily before a meal. 180 capsule 1   No facility-administered medications prior to visit.      Per HPI unless specifically indicated in ROS section  below Review of Systems     Objective:    BP 118/68 (BP Location: Left Arm, Patient Position: Sitting, Cuff Size: Normal)   Pulse 70   Temp 98.2 F (36.8 C) (Oral)   Ht 5' 4"  (1.626 m)   Wt 187 lb 8 oz (85 kg)   SpO2 97%   BMI 32.18 kg/m   Wt Readings from Last 3 Encounters:  01/09/18 187 lb 8 oz (85 kg)  12/13/17 188 lb 12 oz (85.6 kg)  11/11/17 188 lb 11.2 oz (85.6 kg)    Physical Exam  Constitutional: She appears well-developed and well-nourished. She does not appear ill. No distress.  HENT:  Head: Normocephalic and atraumatic.  Right Ear: Hearing, tympanic membrane, external ear and ear canal normal.  Left Ear: Hearing, tympanic membrane, external ear and  ear canal normal.  Nose: Nose normal. Right sinus exhibits no maxillary sinus tenderness and no frontal sinus tenderness. Left sinus exhibits no maxillary sinus tenderness and no frontal sinus tenderness.  Mouth/Throat: Uvula is midline, oropharynx is clear and moist and mucous membranes are normal. No oropharyngeal exudate, posterior oropharyngeal edema, posterior oropharyngeal erythema or tonsillar abscesses.  Eyes: Pupils are equal, round, and reactive to light. Conjunctivae and EOM are normal. No scleral icterus.  Neck: Normal range of motion. Neck supple.  Cardiovascular: Normal rate, regular rhythm, normal heart sounds and intact distal pulses.  No murmur heard. Pulmonary/Chest: Effort normal and breath sounds normal. No respiratory distress. She has no wheezes. She has no rales.  Abdominal: Soft. Normal appearance, normal aorta and bowel sounds are normal. She exhibits no distension and no mass. There is no hepatosplenomegaly. There is tenderness (mild nonspecific) in the right lower quadrant, left upper quadrant and left lower quadrant. There is no rebound, no guarding, no CVA tenderness and negative Murphy's sign. No hernia.  Musculoskeletal: Normal range of motion. She exhibits no edema.  Lymphadenopathy:    She has no cervical adenopathy.  Neurological: She is alert.  CN 2-12 intact FTN intact No EOMI  Skin: Skin is warm and dry. No rash noted. No erythema.  Psychiatric: She has a normal mood and affect.  Nursing note and vitals reviewed.  Lab Results  Component Value Date   ALT 15 09/02/2017   AST 17 09/02/2017   ALKPHOS 51 09/02/2017   BILITOT 0.3 09/02/2017   Lab Results  Component Value Date   CREATININE 0.80 09/02/2017   BUN 13 09/02/2017   NA 138 09/02/2017   K 3.8 09/02/2017   CL 100 09/02/2017   CO2 30 09/02/2017    Lab Results  Component Value Date   WBC 9.6 09/06/2017   HGB 11.6 (L) 09/06/2017   HCT 36.1 09/06/2017   MCV 80.0 09/06/2017   PLT 346.0  09/06/2017       Assessment & Plan:   Problem List Items Addressed This Visit    LUQ discomfort - Primary    Unclear etiology, benign exam and workup to date including last week's abd [place] which was reviewed. Positional and reproducible discomfort to palpation of abdominal muscles - anticipate abdominal wall strain. Rx flexeril, heating pad, update if not improved with treatment.       Headache    Unclear cause. Not consistent with GCA. Benign neurological exam. Rx flexeril and update if not improving with muscle relaxant treatment for futher evaluation.      Relevant Medications   cyclobenzaprine (FLEXERIL) 5 MG tablet   GERD    Currently well managed with  low dose OTC prevacid BID.       Relevant Medications   lansoprazole (PREVACID) 15 MG capsule       Meds ordered this encounter  Medications  . lansoprazole (PREVACID) 15 MG capsule    Sig: Take 1 capsule (15 mg total) by mouth 2 (two) times daily before a meal.  . cyclobenzaprine (FLEXERIL) 5 MG tablet    Sig: Take 1-2 tablets (5-10 mg total) by mouth 2 (two) times daily as needed for muscle spasms.    Dispense:  40 tablet    Refill:  0   No orders of the defined types were placed in this encounter.   Follow up plan: Return if symptoms worsen or fail to improve.  Ria Bush, MD

## 2018-01-10 DIAGNOSIS — G8929 Other chronic pain: Secondary | ICD-10-CM | POA: Insufficient documentation

## 2018-01-10 DIAGNOSIS — R1012 Left upper quadrant pain: Secondary | ICD-10-CM | POA: Insufficient documentation

## 2018-01-10 DIAGNOSIS — R519 Headache, unspecified: Secondary | ICD-10-CM | POA: Insufficient documentation

## 2018-01-10 DIAGNOSIS — R51 Headache: Secondary | ICD-10-CM

## 2018-01-10 NOTE — Assessment & Plan Note (Addendum)
Unclear etiology, benign exam and workup to date including last week's abd [place] which was reviewed. Positional and reproducible discomfort to palpation of abdominal muscles - anticipate abdominal wall strain. Rx flexeril, heating pad, update if not improved with treatment.

## 2018-01-10 NOTE — Assessment & Plan Note (Signed)
Unclear cause. Not consistent with GCA. Benign neurological exam. Rx flexeril and update if not improving with muscle relaxant treatment for futher evaluation.

## 2018-01-10 NOTE — Assessment & Plan Note (Addendum)
Currently well managed with low dose OTC prevacid BID.

## 2018-01-19 DIAGNOSIS — M9902 Segmental and somatic dysfunction of thoracic region: Secondary | ICD-10-CM | POA: Diagnosis not present

## 2018-01-19 DIAGNOSIS — M9901 Segmental and somatic dysfunction of cervical region: Secondary | ICD-10-CM | POA: Diagnosis not present

## 2018-01-19 DIAGNOSIS — M5413 Radiculopathy, cervicothoracic region: Secondary | ICD-10-CM | POA: Diagnosis not present

## 2018-01-19 DIAGNOSIS — M5417 Radiculopathy, lumbosacral region: Secondary | ICD-10-CM | POA: Diagnosis not present

## 2018-02-08 DIAGNOSIS — M9902 Segmental and somatic dysfunction of thoracic region: Secondary | ICD-10-CM | POA: Diagnosis not present

## 2018-02-08 DIAGNOSIS — M9901 Segmental and somatic dysfunction of cervical region: Secondary | ICD-10-CM | POA: Diagnosis not present

## 2018-02-08 DIAGNOSIS — M5413 Radiculopathy, cervicothoracic region: Secondary | ICD-10-CM | POA: Diagnosis not present

## 2018-02-08 DIAGNOSIS — M5417 Radiculopathy, lumbosacral region: Secondary | ICD-10-CM | POA: Diagnosis not present

## 2018-02-13 ENCOUNTER — Ambulatory Visit: Payer: BLUE CROSS/BLUE SHIELD | Admitting: Internal Medicine

## 2018-02-13 DIAGNOSIS — R109 Unspecified abdominal pain: Secondary | ICD-10-CM | POA: Insufficient documentation

## 2018-02-13 NOTE — Assessment & Plan Note (Signed)
Increase fluids. No clear sign of UTI. If persistent consider Urology referral.

## 2018-02-13 NOTE — Assessment & Plan Note (Signed)
Mild change noted on MMSE. Will eval with labs.

## 2018-02-13 NOTE — Assessment & Plan Note (Signed)
UA clear, no mass palpated. Eval with labs and consider [place] if not improving.

## 2018-02-28 ENCOUNTER — Other Ambulatory Visit: Payer: Self-pay | Admitting: Family Medicine

## 2018-03-07 DIAGNOSIS — M9902 Segmental and somatic dysfunction of thoracic region: Secondary | ICD-10-CM | POA: Diagnosis not present

## 2018-03-07 DIAGNOSIS — M9901 Segmental and somatic dysfunction of cervical region: Secondary | ICD-10-CM | POA: Diagnosis not present

## 2018-03-07 DIAGNOSIS — M5417 Radiculopathy, lumbosacral region: Secondary | ICD-10-CM | POA: Diagnosis not present

## 2018-03-07 DIAGNOSIS — M5413 Radiculopathy, cervicothoracic region: Secondary | ICD-10-CM | POA: Diagnosis not present

## 2018-03-14 ENCOUNTER — Other Ambulatory Visit: Payer: Self-pay | Admitting: Family Medicine

## 2018-03-28 DIAGNOSIS — M5413 Radiculopathy, cervicothoracic region: Secondary | ICD-10-CM | POA: Diagnosis not present

## 2018-03-28 DIAGNOSIS — M9901 Segmental and somatic dysfunction of cervical region: Secondary | ICD-10-CM | POA: Diagnosis not present

## 2018-03-28 DIAGNOSIS — M5417 Radiculopathy, lumbosacral region: Secondary | ICD-10-CM | POA: Diagnosis not present

## 2018-03-28 DIAGNOSIS — M9902 Segmental and somatic dysfunction of thoracic region: Secondary | ICD-10-CM | POA: Diagnosis not present

## 2018-03-29 ENCOUNTER — Telehealth: Payer: Self-pay

## 2018-03-29 ENCOUNTER — Other Ambulatory Visit: Payer: Self-pay | Admitting: Family Medicine

## 2018-03-29 MED ORDER — LOSARTAN POTASSIUM 100 MG PO TABS: 100.0000 mg | ORAL_TABLET | Freq: Every day | ORAL | 3 refills | Status: DC

## 2018-03-29 MED ORDER — HYDROCHLOROTHIAZIDE 25 MG PO TABS: 25.0000 mg | ORAL_TABLET | Freq: Every day | ORAL | 3 refills | Status: DC

## 2018-03-29 NOTE — Telephone Encounter (Signed)
Shada with [facility] said pt was calling to ck on refill of losartan HCTZ 100-25. I spoke with [place] at [facility] and they did not receive refill sent electronically on 03/14/18 for losartan HCTZ 100-25. [place] also said that the med was on back order and for pt to pick up would need 2 separate rx for losartan and HCTZ. (there would also be 2 copays) pt will ck with pharmacy later today. [facility]

## 2018-03-29 NOTE — Telephone Encounter (Signed)
2 separate Rxs sent in.

## 2018-03-31 ENCOUNTER — Ambulatory Visit: Payer: Medicare Other | Admitting: Internal Medicine

## 2018-03-31 ENCOUNTER — Other Ambulatory Visit (INDEPENDENT_AMBULATORY_CARE_PROVIDER_SITE_OTHER): Payer: Medicare Other

## 2018-03-31 ENCOUNTER — Encounter: Payer: Self-pay | Admitting: Internal Medicine

## 2018-03-31 VITALS — BP 136/82 | HR 86 | Ht 64.0 in | Wt 192.8 lb

## 2018-03-31 DIAGNOSIS — R1012 Left upper quadrant pain: Secondary | ICD-10-CM | POA: Diagnosis not present

## 2018-03-31 DIAGNOSIS — Z52008 Unspecified donor, other blood: Secondary | ICD-10-CM

## 2018-03-31 DIAGNOSIS — R6881 Early satiety: Secondary | ICD-10-CM | POA: Diagnosis not present

## 2018-03-31 DIAGNOSIS — D500 Iron deficiency anemia secondary to blood loss (chronic): Secondary | ICD-10-CM

## 2018-03-31 DIAGNOSIS — C4499 Other specified malignant neoplasm of skin, unspecified: Secondary | ICD-10-CM

## 2018-03-31 DIAGNOSIS — C519 Malignant neoplasm of vulva, unspecified: Secondary | ICD-10-CM

## 2018-03-31 LAB — CBC WITH DIFFERENTIAL/PLATELET
BASOS ABS: 0.1 10*3/uL (ref 0.0–0.1)
BASOS PCT: 0.7 % (ref 0.0–3.0)
EOS ABS: 0.1 10*3/uL (ref 0.0–0.7)
Eosinophils Relative: 1.6 % (ref 0.0–5.0)
HCT: 41.6 % (ref 36.0–46.0)
HEMOGLOBIN: 13.9 g/dL (ref 12.0–15.0)
Lymphocytes Relative: 40.4 % (ref 12.0–46.0)
Lymphs Abs: 3.2 10*3/uL (ref 0.7–4.0)
MCHC: 33.5 g/dL (ref 30.0–36.0)
MCV: 92.9 fl (ref 78.0–100.0)
Monocytes Absolute: 0.8 10*3/uL (ref 0.1–1.0)
Monocytes Relative: 10.2 % (ref 3.0–12.0)
Neutro Abs: 3.7 10*3/uL (ref 1.4–7.7)
Neutrophils Relative %: 47.1 % (ref 43.0–77.0)
PLATELETS: 237 10*3/uL (ref 150.0–400.0)
RBC: 4.48 Mil/uL (ref 3.87–5.11)
RDW: 14.7 % (ref 11.5–15.5)
WBC: 7.9 10*3/uL (ref 4.0–10.5)

## 2018-03-31 LAB — FERRITIN: Ferritin: 30.9 ng/mL (ref 10.0–291.0)

## 2018-03-31 NOTE — Patient Instructions (Signed)
Normal BMI (Body Mass Index- based on height and weight) is between 23 and 30. Your BMI today is Body mass index is 33.09 kg/m. Marland Kitchen Please consider follow up  regarding your BMI with your Primary Care Provider.   Your provider has requested that you go to the basement level for lab work before leaving today. Press "B" on the elevator. The lab is located at the first door on the left as you exit the elevator.    You have been scheduled for an endoscopy. Please follow written instructions given to you at your visit today. If you use inhalers (even only as needed), please bring them with you on the day of your procedure.   I appreciate the opportunity to care for you. Silvano Rusk, MD, [facility]

## 2018-03-31 NOTE — Progress Notes (Signed)
[name] 67 y.o. [birth year] [id number]  Assessment & Plan:   Encounter Diagnoses  Name Primary?  . LUQ discomfort Yes  . Early satiety   . Iron deficiency anemia due to chronic blood loss   . Blood donor     Evaluate with EGD The risks and benefits as well as alternatives of endoscopic procedure(s) have been discussed and reviewed. All questions answered. The patient agrees to proceed.   I suspect her iron deficiency is from chronic blood loss through blood donation.  Look for possible causes a EGD also.  Consider duodenal biopsies for celiac disease pending exam.  Consider the possibility of her weight gain causing this symptom  Repeat CBC and ferritin today. Continue to avoid donating blood  I appreciate the opportunity to care for this patient. CC: Jinny Sanders, MD     Subjective:   Chief Complaint: Upper quadrant discomfort, early satiety  HPI Patient is here with months of left upper quadrant discomfort and early satiety after eating.  She previously had had musculoskeletal like pain after she had a lot of problems with URI and coughing and sneezing issues in the spring.  She says those symptoms are gone.  However she does get full sooner than she has in the past after eating the same amount.  Weights are listed below.  She had a negative abdominal ultrasound in July. [facility] as well.  She was noted to be iron deficient in the spring.  She has been a long-term blood donor but has stopped.  She is on iron replacement therapy now.  Long-term PPI for LPR in the form of lansoprazole 15 mg twice daily weight has risen as reflected below Wt Readings from Last 3 Encounters:  03/31/18 192 lb 12.8 oz (87.5 kg)  01/09/18 187 lb 8 oz (85 kg)  12/13/17 188 lb 12 oz (85.6 kg)  In April she was 82.7 kg NL colonoscopy 2017  Neg Abd [place] 12/2017 (fatty liver)  Lab Results  Component Value Date   FERRITIN 6.4 (L) 09/06/2017   Lab  Results  Component Value Date   WBC 9.6 09/06/2017   HGB 11.6 (L) 09/06/2017   HCT 36.1 09/06/2017   MCV 80.0 09/06/2017   PLT 346.0 09/06/2017    Allergies  Allergen Reactions  . Ibuprofen Hypertension  . Naprosyn [Naproxen] Nausea Only  . Other     Allergic to toothpaste- makes mouth peel Hay fever/dust mites/trees "365 allergic"   . Azithromycin Itching and Rash  . Sulfa Antibiotics Hives and Rash   Current Meds  Medication Sig  . albuterol (PROVENTIL HFA;VENTOLIN HFA) 108 (90 Base) MCG/ACT inhaler Inhale 2 puffs into the lungs every 6 (six) hours as needed. Reported on 10/24/2015  . azelastine (ASTELIN) 0.1 % nasal spray USE 1 SPRAY IN EACH NOSTRIL TWICE A DAY AS DIRECTED  . cholecalciferol (VITAMIN D) 1000 units tablet Take 1,000 Units by mouth daily.  . clobetasol cream (TEMOVATE) 3.54 % Apply 1 application topically 2 (two) times daily. To the vulva  . hydrochlorothiazide (HYDRODIURIL) 25 MG tablet Take 1 tablet (25 mg total) by mouth daily.  . IRON PO Take by mouth daily.  . lansoprazole (PREVACID) 15 MG capsule Take 1 capsule (15 mg total) by mouth 2 (two) times daily before a meal.  . levocetirizine (XYZAL) 5 MG tablet Take 5 mg by mouth every evening.  Marland Kitchen losartan (COZAAR) 100 MG tablet Take 1 tablet (100  mg total) by mouth daily.  . metoprolol tartrate (LOPRESSOR) 25 MG tablet TAKE 1/4 TABLET TWICE A DAY  . montelukast (SINGULAIR) 10 MG tablet TAKE 1 TABLET EVERY MORNING  . OVER THE COUNTER MEDICATION American Dream Z take 2 tablets by mouth at bedtime.  . Triamcinolone & Emollient (DERMASORB TA) 0.1 % KIT   . triamcinolone ointment (KENALOG) 0.5 % Apply 1 application topically 2 (two) times daily.   Past Medical History:  Diagnosis Date  . Allergic rhinitis   . Arthritis   . GERD (gastroesophageal reflux disease)   . Headache(784.0)    Migraines  . History of adenomatous polyp of colon   . History of concussion    AS CHILD--  NO RESIDUAL  . History of  palpitations   . Hypertension   . Paget's disease of vulva   . PONV (postoperative nausea and vomiting)    SEVERE  . Wears glasses    Past Surgical History:  Procedure Laterality Date  . mucoid cyst removal thumb    . 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 PULLEY RELEASE RIGHT THUMB  03-23-2011  . SIMPLE VULVECTOMY  03-24-2010   right side  . TONSILLECTOMY  1977  . TRIGGER FINGER RELEASE    . VAGINAL HYSTERECTOMY  1989   and Anterior and posterior repair's for prolapse  . VULVECTOMY Right 01/10/2015   Procedure: RIGHT WIDE LOCAL EXCISION VULVECTOMY;  Surgeon: [name], MD;  Location: [facility];  Service: Gynecology;  Laterality: Right;  Marland Kitchen VULVECTOMY Right 04/10/2015   Procedure: WIDE LOCAL EXCISION VULVA;  Surgeon: [name], MD;  Location: [facility];  Service: Gynecology;  Laterality: Right;  . WISDOM TOOTH EXTRACTION     Social History   Social History Narrative   She is married.  She is a Forensic psychologist at [employer]   Occasional wine, former smoker not now no substances   family history includes Asthma in her father; Breast cancer (age of onset: 10) in her paternal grandmother; Cancer in her maternal grandmother; Colon cancer in her paternal uncle; Heart disease in her father and mother; Hypertension in her father and mother.   Review of Systems See HPI, she has chronic back pain  Objective:   Physical Exam _0  136/82   Pulse 86   Ht 5' 4" (1.626 m)   Wt 192 lb 12.8 oz (87.5 kg)   BMI 33.09 kg/m @  General:  NAD Eyes:   anicteric Lungs:  clear Heart::  S1S2 no rubs, murmurs or gallops Abdomen:  soft and mildly tender in the epigastrium, BS+ no hepatosplenomegaly or mass no rib tenderness     Data Reviewed:  See HPI

## 2018-04-03 NOTE — Progress Notes (Signed)
CBC and ferritin ok My Chart

## 2018-04-04 ENCOUNTER — Encounter: Payer: Self-pay | Admitting: Internal Medicine

## 2018-04-07 ENCOUNTER — Encounter: Payer: Self-pay | Admitting: Family Medicine

## 2018-04-07 ENCOUNTER — Ambulatory Visit: Payer: Medicare Other | Admitting: Family Medicine

## 2018-04-07 DIAGNOSIS — I10 Essential (primary) hypertension: Secondary | ICD-10-CM | POA: Diagnosis not present

## 2018-04-07 DIAGNOSIS — Z23 Encounter for immunization: Secondary | ICD-10-CM | POA: Diagnosis not present

## 2018-04-07 MED ORDER — LOSARTAN POTASSIUM-HCTZ 100-25 MG PO TABS: 1.0000 | ORAL_TABLET | Freq: Every day | ORAL | 3 refills | Status: DC

## 2018-04-07 NOTE — Progress Notes (Signed)
   Subjective:    Patient ID: [name], female    DOB: [birth year], 67 y.o.   MRN: [id number]  HPI  67 year old female presetns fro 6 month follow up HTN. Hypertension:   Good control.. Wants losartan/HCTZ back together in one tablet. Using medication without problems or lightheadedness: none Chest pain with exertion:none Edema:none Short of breath:none Average home BPs: Other issues:   Blood pressure 124/78, pulse 72, temperature 98.3 F (36.8 C), temperature source Oral, height 5\' 4"  (1.626 m), weight 194 lb (88 kg), SpO2 97 %. Social History /Family History/Past Medical History reviewed in detail and updated in EMR if needed.  Review of Systems  Constitutional: Negative for fatigue and fever.  HENT: Negative for congestion.   Eyes: Negative for pain.  Respiratory: Negative for cough and shortness of breath.   Cardiovascular: Negative for chest pain, palpitations and leg swelling.  Gastrointestinal: Negative for abdominal pain.  Genitourinary: Negative for dysuria and vaginal bleeding.  Musculoskeletal: Negative for back pain.  Neurological: Negative for syncope, light-headedness and headaches.  Psychiatric/Behavioral: Negative for dysphoric mood.       Objective:   Physical Exam  Constitutional: Vital signs are normal. She appears well-developed and well-nourished. She is cooperative.  Non-toxic appearance. She does not appear ill. No distress.  HENT:  Head: Normocephalic.  Right Ear: Hearing, tympanic membrane, external ear and ear canal normal. Tympanic membrane is not erythematous, not retracted and not bulging.  Left Ear: Hearing, tympanic membrane, external ear and ear canal normal. Tympanic membrane is not erythematous, not retracted and not bulging.  Nose: No mucosal edema or rhinorrhea. Right sinus exhibits no maxillary sinus tenderness and no frontal sinus tenderness. Left sinus exhibits no maxillary sinus tenderness and no frontal sinus tenderness.    Mouth/Throat: Uvula is midline, oropharynx is clear and moist and mucous membranes are normal.  Eyes: Pupils are equal, round, and reactive to light. Conjunctivae, EOM and lids are normal. Lids are everted and swept, no foreign bodies found.  Neck: Trachea normal and normal range of motion. Neck supple. Carotid bruit is not present. No thyroid mass and no thyromegaly present.  Cardiovascular: Normal rate, regular rhythm, S1 normal, S2 normal, normal heart sounds, intact distal pulses and normal pulses. Exam reveals no gallop and no friction rub.  No murmur heard. Pulmonary/Chest: Effort normal and breath sounds normal. No tachypnea. No respiratory distress. She has no decreased breath sounds. She has no wheezes. She has no rhonchi. She has no rales.  Abdominal: Soft. Normal appearance and bowel sounds are normal. There is no tenderness.  Neurological: She is alert.  Skin: Skin is warm, dry and intact. No rash noted.  Psychiatric: Her speech is normal and behavior is normal. Judgment and thought content normal. Her mood appears not anxious. Cognition and memory are normal. She does not exhibit a depressed mood.          Assessment & Plan:

## 2018-04-07 NOTE — Patient Instructions (Signed)
Work on increasing exercise and work on [employer].

## 2018-04-07 NOTE — Assessment & Plan Note (Signed)
Well controlled. Continue current medication.  

## 2018-04-18 ENCOUNTER — Ambulatory Visit (AMBULATORY_SURGERY_CENTER): Payer: Medicare Other | Admitting: Internal Medicine

## 2018-04-18 ENCOUNTER — Encounter: Payer: Self-pay | Admitting: Internal Medicine

## 2018-04-18 VITALS — BP 143/77 | HR 62 | Temp 96.9°F | Resp 15 | Ht 60.0 in | Wt 194.0 lb

## 2018-04-18 DIAGNOSIS — R1012 Left upper quadrant pain: Secondary | ICD-10-CM

## 2018-04-18 DIAGNOSIS — K317 Polyp of stomach and duodenum: Secondary | ICD-10-CM

## 2018-04-18 DIAGNOSIS — R109 Unspecified abdominal pain: Secondary | ICD-10-CM | POA: Diagnosis not present

## 2018-04-18 MED ORDER — SODIUM CHLORIDE 0.9 % IV SOLN: 500.0000 mL | Freq: Once | INTRAVENOUS | Status: DC

## 2018-04-18 NOTE — Op Note (Signed)
[facility] Patient Name: [name] Procedure Date: 04/18/2018 1:14 PM MRN: [id number] Endoscopist: Gatha Mayer , MD Age: 67 Referring MD:  Date of Birth: February 22, 1951 Gender: Female Account #: [id number] Procedure:                Upper GI endoscopy Indications:              Abdominal pain in the left upper quadrant, Iron                            deficiency anemia Medicines:                Propofol per Anesthesia, Monitored Anesthesia Care Procedure:                Pre-Anesthesia Assessment:                           - Prior to the procedure, a History and Physical                            was performed, and patient medications and                            allergies were reviewed. The patient's tolerance of                            previous anesthesia was also reviewed. The risks                            and benefits of the procedure and the sedation                            options and risks were discussed with the patient.                            All questions were answered, and informed consent                            was obtained. Prior Anticoagulants: The patient has                            taken no previous anticoagulant or antiplatelet                            agents. ASA Grade Assessment: II - A patient with                            mild systemic disease. After reviewing the risks                            and benefits, the patient was deemed in                            satisfactory condition to undergo the procedure.  After obtaining informed consent, the endoscope was                            passed under direct vision. Throughout the                            procedure, the patient's blood pressure, pulse, and                            oxygen saturations were monitored continuously. The                            Model GIF-[id number]) scope was introduced                            through the  mouth, and advanced to the second part                            of duodenum. The upper GI endoscopy was                            accomplished without difficulty. The patient                            tolerated the procedure well. Scope In: Scope Out: Findings:                 Multiple 3 to 10 mm semi-sessile polyps with no                            stigmata of recent bleeding were found in the                            cardia, in the gastric fundus and in the gastric                            body. Biopsies were taken with a cold forceps for                            histology. Verification of patient identification                            for the specimen was done. Estimated blood loss was                            minimal.                           The exam was otherwise without abnormality.                           The cardia and gastric fundus were normal on  retroflexion. Complications:            No immediate complications. Estimated Blood Loss:     Estimated blood loss was minimal. Impression:               - Multiple gastric polyps. Biopsied.                           - The examination was otherwise normal. Recommendation:           - Patient has a contact number available for                            emergencies. The signs and symptoms of potential                            delayed complications were discussed with the                            patient. Return to normal activities tomorrow.                            Written discharge instructions were provided to the                            patient.                           - Resume previous diet.                           - Continue present medications.                           - Await pathology results.                           - She may try FD Donald Prose for LUQ pain and dyspepsia.                           Avoid blood donation which I think is source of                             iron deficiency anemia                           Do not think thes polyps are related to any of her                            signs or symptoms - suspect fundic gland polyps as                            in past. Gatha Mayer, MD 04/18/2018 1:37:37 PM This report has been signed electronically.

## 2018-04-18 NOTE — Progress Notes (Signed)
A/ox3 pleased with MAC, report to RN 

## 2018-04-18 NOTE — Patient Instructions (Addendum)
I saw the gastric polyps again - took biopsies but they look like same innocent fundic gland polyps (do not turn to cancer).  No source of blood loss or anemia seen. No cause of abdominal discomfort or feeling full though many times that is related to poor relaxation of stomach wall muscles.  Continue to avoid donating blood.  If you want to try a medication for your symptoms please try FD Donald Prose - it is an over the counter treatment for dyspepsia (the type of symptoms you have) - take 2 before meals.  I will send you notification about the polyp biopsies.  I appreciate the opportunity to care for you. Gatha Mayer, MD, FACG YOU HAD AN ENDOSCOPIC PROCEDURE TODAY AT [facility]:   Refer to the procedure report that was given to you for any specific questions about what was found during the examination.  If the procedure report does not answer your questions, please call your gastroenterologist to clarify.  If you requested that your care partner not be given the details of your procedure findings, then the procedure report has been included in a sealed envelope for you to review at your convenience later.  YOU SHOULD EXPECT: Some feelings of bloating in the abdomen. Passage of more gas than usual.  Walking can help get rid of the air that was put into your GI tract during the procedure and reduce the bloating. If you had a lower endoscopy (such as a colonoscopy or flexible sigmoidoscopy) you may notice spotting of blood in your stool or on the toilet paper. If you underwent a bowel prep for your procedure, you may not have a normal bowel movement for a few days.  Please Note:  You might notice some irritation and congestion in your nose or some drainage.  This is from the oxygen used during your procedure.  There is no need for concern and it should clear up in a day or so.  SYMPTOMS TO REPORT IMMEDIATELY:   Following upper endoscopy (EGD)  Vomiting of blood or coffee  ground material  New chest pain or pain under the shoulder blades  Painful or persistently difficult swallowing  New shortness of breath  Fever of 100F or higher  Black, tarry-looking stools  For urgent or emergent issues, a gastroenterologist can be reached at any hour by calling [phone].   DIET:  We do recommend a small meal at first, but then you may proceed to your regular diet.  Drink plenty of fluids but you should avoid alcoholic beverages for 24 hours.  MEDICATIONS: Continue present medications. You can try FD Donald Prose for left upper quadrant abdominal pain and dyspepsia. Samples given here in recovery (12 capsules) and you may purchase this over the counter if it helps.  Avoid blood donation which Dr. Carlean Purl believes is the source of iron deficiency anemia.  ACTIVITY:  You should plan to take it easy for the rest of today and you should NOT DRIVE or use heavy machinery until tomorrow (because of the sedation medicines used during the test).    FOLLOW UP: Our staff will call the number listed on your records the next business day following your procedure to check on you and address any questions or concerns that you may have regarding the information given to you following your procedure. If we do not reach you, we will leave a message.  However, if you are feeling well and you are not experiencing any problems, there is  no need to return our call.  We will assume that you have returned to your regular daily activities without incident.  If any biopsies were taken you will be contacted by phone or by letter within the next 1-3 weeks.  Please call us at [phone] if you have not heard about the biopsies in 3 weeks.   Thank you for allowing [place] to provide for your healthcare needs today.   SIGNATURES/CONFIDENTIALITY: You and/or your care partner have signed paperwork which will be entered into your electronic medical record.  These signatures attest to the fact that that the  information above on your After Visit Summary has been reviewed and is understood.  Full responsibility of the confidentiality of this discharge information lies with you and/or your care-partner.

## 2018-04-18 NOTE — Progress Notes (Signed)
Called to room to assist during endoscopic procedure.  Patient ID and intended procedure confirmed with present staff. Received instructions for my participation in the procedure from the performing physician.  

## 2018-04-19 ENCOUNTER — Telehealth: Payer: Self-pay

## 2018-04-19 ENCOUNTER — Telehealth: Payer: Self-pay | Admitting: *Deleted

## 2018-04-19 NOTE — Telephone Encounter (Signed)
No answer, left message to call back later today, B.Tysheem Accardo RN. 

## 2018-04-19 NOTE — Telephone Encounter (Signed)
  Follow up Call-  Call back number 04/18/2018 07/31/2015  Post procedure Call Back phone  # [phone], [phone]  Permission to leave phone message Yes Yes  Some recent data might be hidden     Patient questions:  Do you have a fever, pain , or abdominal swelling? No. Pain Score  0 *  Have you tolerated food without any problems? Yes.    Have you been able to return to your normal activities? Yes.    Do you have any questions about your discharge instructions: Diet   No. Medications  No. Follow up visit  No.  Do you have questions or concerns about your Care? No.  Actions: * If pain score is 4 or above: No action needed, pain <4.

## 2018-04-26 ENCOUNTER — Encounter: Payer: Self-pay | Admitting: Internal Medicine

## 2018-05-03 ENCOUNTER — Other Ambulatory Visit: Payer: Self-pay | Admitting: Family Medicine

## 2018-05-10 DIAGNOSIS — M9902 Segmental and somatic dysfunction of thoracic region: Secondary | ICD-10-CM | POA: Diagnosis not present

## 2018-05-10 DIAGNOSIS — M9901 Segmental and somatic dysfunction of cervical region: Secondary | ICD-10-CM | POA: Diagnosis not present

## 2018-05-10 DIAGNOSIS — M5413 Radiculopathy, cervicothoracic region: Secondary | ICD-10-CM | POA: Diagnosis not present

## 2018-05-10 DIAGNOSIS — M5417 Radiculopathy, lumbosacral region: Secondary | ICD-10-CM | POA: Diagnosis not present

## 2018-05-16 ENCOUNTER — Inpatient Hospital Stay: Payer: Medicare Other | Attending: Gynecology | Admitting: Gynecology

## 2018-05-16 ENCOUNTER — Encounter: Payer: Self-pay | Admitting: Gynecology

## 2018-05-16 VITALS — BP 146/84 | HR 68 | Temp 98.4°F | Resp 20 | Ht 64.0 in | Wt 195.0 lb

## 2018-05-16 DIAGNOSIS — C519 Malignant neoplasm of vulva, unspecified: Secondary | ICD-10-CM | POA: Diagnosis not present

## 2018-05-16 DIAGNOSIS — Z90710 Acquired absence of both cervix and uterus: Secondary | ICD-10-CM | POA: Insufficient documentation

## 2018-05-16 DIAGNOSIS — C4499 Other specified malignant neoplasm of skin, unspecified: Secondary | ICD-10-CM

## 2018-05-16 NOTE — Patient Instructions (Signed)
Follow up with Dr. Fermin Schwab in 6 months.

## 2018-05-16 NOTE — Progress Notes (Signed)
Consult Note: Gyn-Onc   [name] 67 y.o. female  Chief Complaint  Patient presents with  . Paget's disease of vulva    Assessment : Paget's disease of the vulva  . No evidence recurrent disease. Plan:    Patient return in 6 months.  .  Interval History:    Patient  returns today as previously scheduled. Since her last visit she's done well. Specifically she denies any vulvar symptoms, except for very occasional pruritus in the upper aspect of the right vulva.  She uses clobetasol as needed (rarely).  She denies any urinary incontinence and has no other GI GU or pelvic symptoms.   HPI:The patient initially underwent surgery for Paget's disease of the vulva in October 2011. There was focal involvement of the medial margin. The entire lesion is approximately 5 cm in diameter.   The patient previously had a supracervical hysterectomy but has her ovaries and cervix in situ.  She notes that she had some repair done at the time of her hysterectomy and at her introitus is tight and she has difficulty having intercourse and also has tearing. On the other hand her husband has had prostate cancer surgery making intercourse difficult as well.  Patient underwent wide local excision of a recurrent lesion on August 50,016 while all gross lesion was removed she did have positive surgical margins that were focal. Subsequently she underwent a another excision on 04/10/2015 with negative margins.     Review of Systems:10 point review of systems is negative except as noted in interval history.   Vitals: Blood pressure (!) 146/84, pulse 68, temperature 98.4 F (36.9 C), temperature source Oral, resp. rate 20, height _0  (1.626 m), weight 195 lb (88.5 kg), SpO2 95 %.  Physical Exam: General : The patient is a healthy woman in no acute distress.  HEENT: normocephalic, extraoccular movements normal; neck is supple without thyromegally  Lynphnodes: Supraclavicular and inguinal nodes not enlarged   Abdomen: Soft, non-tender, no ascites, no organomegally, no masses, no hernias  Pelvic:  EGBUS: Normal female there is a defect in the right vulva consistent with prior excision of the labia minora..  No lesions are noted.    Lower extremities: No edema or varicosities. Normal range of motion   Allergies  Allergen Reactions  . Ibuprofen Hypertension  . Naprosyn [Naproxen] Nausea Only  . Other     Allergic to toothpaste- makes mouth peel Hay fever/dust mites/trees "365 allergic"   . Sevoflurane Nausea And Vomiting  . Azithromycin Itching and Rash  . Sulfa Antibiotics Hives and Rash    Past Medical History:  Diagnosis Date  . Allergic rhinitis   . Arthritis   . GERD (gastroesophageal reflux disease)   . Headache(784.0)    Migraines  . History of adenomatous polyp of colon   . History of concussion    AS CHILD--  NO RESIDUAL  . History of palpitations   . Hypertension   . Paget's disease of vulva   . PONV (postoperative nausea and vomiting)    SEVERE  . Wears glasses     Past Surgical History:  Procedure Laterality Date  . mucoid cyst removal thumb    . 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 PULLEY RELEASE RIGHT THUMB  03-23-2011  . SIMPLE VULVECTOMY  03-24-2010   right side  . TONSILLECTOMY  1977  . TRIGGER FINGER RELEASE    . VAGINAL  HYSTERECTOMY  1989   and Anterior and posterior repair's for prolapse  . VULVECTOMY Right 01/10/2015   Procedure: RIGHT WIDE LOCAL EXCISION VULVECTOMY;  Surgeon: [name], MD;  Location: [facility];  Service: Gynecology;  Laterality: Right;  Marland Kitchen VULVECTOMY Right 04/10/2015   Procedure: WIDE LOCAL EXCISION VULVA;  Surgeon: [name], MD;  Location: [facility];  Service: Gynecology;  Laterality: Right;  . WISDOM TOOTH EXTRACTION      Current Outpatient Medications  Medication Sig Dispense Refill  .  albuterol (PROVENTIL HFA;VENTOLIN HFA) 108 (90 Base) MCG/ACT inhaler Inhale 2 puffs into the lungs every 6 (six) hours as needed. Reported on 10/24/2015 1 Inhaler 0  . azelastine (ASTELIN) 0.1 % nasal spray USE 1 SPRAY IN EACH NOSTRIL TWICE A DAY AS DIRECTED 90 mL 1  . cholecalciferol (VITAMIN D) 1000 units tablet Take 1,000 Units by mouth daily.    . clobetasol cream (TEMOVATE) 4.00 % Apply 1 application topically 2 (two) times daily. To the vulva 30 g 0  . famotidine (PEPCID) 40 MG tablet Take 0.5 tablets (20 mg total) by mouth 2 (two) times daily. 90 tablet 3  . hydrochlorothiazide (HYDRODIURIL) 25 MG tablet Take 25 mg by mouth daily.    . IRON PO Take by mouth daily.    . lansoprazole (PREVACID) 15 MG capsule Take 1 capsule (15 mg total) by mouth 2 (two) times daily before a meal.    . levocetirizine (XYZAL) 5 MG tablet Take 5 mg by mouth every evening.    Marland Kitchen losartan (COZAAR) 100 MG tablet Take 100 mg by mouth daily.    . metoprolol tartrate (LOPRESSOR) 25 MG tablet TAKE 1/4 TABLET TWICE A DAY 45 tablet 1  . montelukast (SINGULAIR) 10 MG tablet TAKE 1 TABLET EVERY MORNING 90 tablet 3  . OVER THE COUNTER MEDICATION [employer] take 2 tablets by mouth at bedtime.    . Triamcinolone & Emollient (DERMASORB TA) 0.1 % KIT   0  . triamcinolone ointment (KENALOG) 0.5 % Apply 1 application topically 2 (two) times daily. 30 g 1   Current Facility-Administered Medications  Medication Dose Route Frequency Provider Last Rate Last Dose  . 0.9 %  sodium chloride infusion  500 mL Intravenous Once Gatha Mayer, MD        Social History   Socioeconomic History  . Marital status: Married    Spouse name: Not on file  . Number of children: Not on file  . Years of education: Not on file  . Highest education level: Not on file  Occupational History  . 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 Financial resource strain: Not on file  . Food insecurity:    Worry: Not on file     Inability: Not on file  . Transportation needs:    Medical: Not on file    Non-medical: Not on file  Tobacco Use  . Smoking status: Former Smoker    Years: 5.00    Types: Cigarettes    Last attempt to quit: 04/15/1989    Years since quitting: 29.1  . Smokeless tobacco: Never Used  Substance and Sexual Activity  . Alcohol use: Yes    Alcohol/week: 1.0 standard drinks    Types: 1 Glasses of wine per week    Comment: occasional wine  . Drug use: No  . Sexual activity: Not on file  Lifestyle  . Physical activity:    Days per week:  Not on file    Minutes per session: Not on file  . Stress: Not on file  Relationships  . Social connections:    Talks on phone: Not on file    Gets together: Not on file    Attends religious service: Not on file    Active member of club or organization: Not on file    Attends meetings of clubs or organizations: Not on file    Relationship status: Not on file  . Intimate partner violence:    Fear of current or ex partner: Not on file    Emotionally abused: Not on file    Physically abused: Not on file    Forced sexual activity: Not on file  Other Topics Concern  . Not on file  Social History Narrative   She is married.  She is a Forensic psychologist at [employer]   Occasional wine, former smoker not now no substances    Family History  Problem Relation Age of Onset  . Cancer Maternal Grandmother   . Hypertension Father   . Heart disease Father   . Asthma Father   . Hypertension Mother   . Heart disease Mother   . Breast cancer Paternal Grandmother 54  . Colon cancer Paternal Grandmother   . Colon cancer Paternal Uncle   . Allergic rhinitis Neg Hx   . Angioedema Neg Hx   . Eczema Neg Hx   . Immunodeficiency Neg Hx   . Urticaria Neg Hx   . Esophageal cancer Neg Hx   . Rectal cancer Neg Hx   . Stomach cancer Neg Hx       [name], MD 05/16/2018, 9:54 AM

## 2018-05-17 DIAGNOSIS — M5413 Radiculopathy, cervicothoracic region: Secondary | ICD-10-CM | POA: Diagnosis not present

## 2018-05-17 DIAGNOSIS — M5417 Radiculopathy, lumbosacral region: Secondary | ICD-10-CM | POA: Diagnosis not present

## 2018-05-17 DIAGNOSIS — M9901 Segmental and somatic dysfunction of cervical region: Secondary | ICD-10-CM | POA: Diagnosis not present

## 2018-05-17 DIAGNOSIS — M9902 Segmental and somatic dysfunction of thoracic region: Secondary | ICD-10-CM | POA: Diagnosis not present

## 2018-05-18 DIAGNOSIS — D485 Neoplasm of uncertain behavior of skin: Secondary | ICD-10-CM | POA: Diagnosis not present

## 2018-05-18 DIAGNOSIS — L820 Inflamed seborrheic keratosis: Secondary | ICD-10-CM | POA: Diagnosis not present

## 2018-05-25 DIAGNOSIS — M9901 Segmental and somatic dysfunction of cervical region: Secondary | ICD-10-CM | POA: Diagnosis not present

## 2018-05-25 DIAGNOSIS — M9902 Segmental and somatic dysfunction of thoracic region: Secondary | ICD-10-CM | POA: Diagnosis not present

## 2018-05-25 DIAGNOSIS — M5413 Radiculopathy, cervicothoracic region: Secondary | ICD-10-CM | POA: Diagnosis not present

## 2018-05-25 DIAGNOSIS — M5417 Radiculopathy, lumbosacral region: Secondary | ICD-10-CM | POA: Diagnosis not present

## 2018-05-26 ENCOUNTER — Telehealth: Payer: Self-pay | Admitting: Internal Medicine

## 2018-05-26 NOTE — Telephone Encounter (Signed)
Left message for patient to call back  

## 2018-05-26 NOTE — Telephone Encounter (Signed)
Patient states she feels bloated and like something is in her stomach. Patient wants to know if this could be from the polyps they found during her egd on 11.12.19.

## 2018-05-29 ENCOUNTER — Telehealth: Payer: Self-pay

## 2018-05-29 NOTE — Telephone Encounter (Signed)
Noted  

## 2018-05-29 NOTE — Telephone Encounter (Signed)
Patient advised that the polyps were suspected fundic gland polyps.  Dr. Carlean Purl did not think, per procedure report, they were responsible for any of her symptoms.  All questions answered.

## 2018-05-29 NOTE — Telephone Encounter (Signed)
Pt concerned that BP has been elevated on and off since 05/16/18.Today first thing in morning BP 160/86 took BP meds and 30' later BP 136/83. Pt has not missed taking losartan 100 mg one daily,HCTZ 25mg  once daily and on 05/28/18 pt increased the metoprolol 25 mg  Taking 1/2 tab bid instead of 1/4 tab bid.(Pt increased on her own).  No H/A, dizziness, CP or SOB. Pt did say feels "fuzzy" on and off. Pt scheduled appt with Dr Diona Browner 05/30/18 at 8:30. ED precautions given and pt voiced understanding. FYI to Dr Diona Browner.

## 2018-05-30 ENCOUNTER — Encounter: Payer: Self-pay | Admitting: Family Medicine

## 2018-05-30 ENCOUNTER — Ambulatory Visit: Payer: Medicare Other | Admitting: Family Medicine

## 2018-05-30 VITALS — BP 120/78 | HR 62 | Temp 98.0°F | Ht 64.0 in | Wt 196.2 lb

## 2018-05-30 DIAGNOSIS — I10 Essential (primary) hypertension: Secondary | ICD-10-CM | POA: Diagnosis not present

## 2018-05-30 NOTE — Assessment & Plan Note (Signed)
Likely increase in BPs given increase salt, weight, ETOH etc.  Work on lifestyle changes. Stay on increased dose of metoprolol at  12.5 mg BID.  Goal BP < 150/90 given age 67 without DM and CKD.

## 2018-05-30 NOTE — Progress Notes (Signed)
Subjective:    Patient ID: [name], female    DOB: [birth year], 67 y.o.   MRN: [id number]  HPI   67 year old female pt with history of HTN presents with recent fluctuation of BPs.   Several  days ago she noted BPs ranging from 137-162/83-97 HR 56-90, once 104  feeling well but hearing pulsse in ears, no headache, no vision, no new neuro Taking meds regularly: On HCTZ, losartan, metoprolol daily ( in last 2 days she has increased metoprol 12.5 mg twice dail Change in diet: New meds/supplements: none  Using medication without problems or lightheadedness:  yes Chest pain with exertion:none Edema:none Short of breath: none Average home BPs: Other issues:   BP Readings from Last 3 Encounters:  05/30/18 120/78  05/16/18 (!) 146/84  04/18/18 (!) 143/77   Wt Readings from Last 3 Encounters:  05/30/18 196 lb 4 oz (89 kg)  05/16/18 195 lb (88.5 kg)  04/18/18 194 lb (88 kg)   No exercise, has been eating more salt, has gained.  Social History /Family History/Past Medical History reviewed in detail and updated in EMR if needed. Blood pressure 120/78, pulse 62, temperature 98 F (36.7 C), temperature source Oral, height 5\' 4"  (1.626 m), weight 196 lb 4 oz (89 kg), SpO2 94 %.  Review of Systems  Constitutional: Negative for fatigue and fever.  HENT: Negative for congestion.   Eyes: Negative for pain.  Respiratory: Negative for cough and shortness of breath.   Cardiovascular: Negative for chest pain, palpitations and leg swelling.  Gastrointestinal: Negative for abdominal pain.  Genitourinary: Negative for dysuria and vaginal bleeding.  Musculoskeletal: Negative for back pain.  Neurological: Negative for syncope, light-headedness and headaches.  Psychiatric/Behavioral: Negative for dysphoric mood.       Objective:   Physical Exam Constitutional:      General: She is not in acute distress.    Appearance: Normal appearance. She is well-developed. She is not ill-appearing  or toxic-appearing.  HENT:     Head: Normocephalic.     Right Ear: Hearing, tympanic membrane, ear canal and external ear normal. Tympanic membrane is not erythematous, retracted or bulging.     Left Ear: Hearing, tympanic membrane, ear canal and external ear normal. Tympanic membrane is not erythematous, retracted or bulging.     Nose: No mucosal edema or rhinorrhea.     Right Sinus: No maxillary sinus tenderness or frontal sinus tenderness.     Left Sinus: No maxillary sinus tenderness or frontal sinus tenderness.     Mouth/Throat:     Pharynx: Uvula midline.  Eyes:     General: Lids are normal. Lids are everted, no foreign bodies appreciated.     Conjunctiva/sclera: Conjunctivae normal.     Pupils: Pupils are equal, round, and reactive to light.  Neck:     Musculoskeletal: Normal range of motion and neck supple.     Thyroid: No thyroid mass or thyromegaly.     Vascular: No carotid bruit.     Trachea: Trachea normal.  Cardiovascular:     Rate and Rhythm: Normal rate and regular rhythm.     Pulses: Normal pulses.     Heart sounds: Normal heart sounds, S1 normal and S2 normal. No murmur. No friction rub. No gallop.   Pulmonary:     Effort: Pulmonary effort is normal. No tachypnea or respiratory distress.     Breath sounds: Normal breath sounds. No decreased breath sounds, wheezing, rhonchi or rales.  Abdominal:  General: Bowel sounds are normal.     Palpations: Abdomen is soft.     Tenderness: There is no abdominal tenderness.  Skin:    General: Skin is warm and dry.     Findings: No rash.  Neurological:     Mental Status: She is alert.  Psychiatric:        Mood and Affect: Mood is not anxious or depressed.        Speech: Speech normal.        Behavior: Behavior normal. Behavior is cooperative.        Thought Content: Thought content normal.        Judgment: Judgment normal.           Assessment & Plan:

## 2018-05-30 NOTE — Patient Instructions (Signed)
Stay on higher dose of metoprolol 12. 5 mg twice daily.  Follow BPs at home .. goal  < 150/ 90.  Work on low salt diet, high potassium diet and work on weight loss.

## 2018-06-05 DIAGNOSIS — M9902 Segmental and somatic dysfunction of thoracic region: Secondary | ICD-10-CM | POA: Diagnosis not present

## 2018-06-05 DIAGNOSIS — M5413 Radiculopathy, cervicothoracic region: Secondary | ICD-10-CM | POA: Diagnosis not present

## 2018-06-05 DIAGNOSIS — M9901 Segmental and somatic dysfunction of cervical region: Secondary | ICD-10-CM | POA: Diagnosis not present

## 2018-06-05 DIAGNOSIS — M5417 Radiculopathy, lumbosacral region: Secondary | ICD-10-CM | POA: Diagnosis not present

## 2018-06-20 DIAGNOSIS — M5413 Radiculopathy, cervicothoracic region: Secondary | ICD-10-CM | POA: Diagnosis not present

## 2018-06-20 DIAGNOSIS — M9902 Segmental and somatic dysfunction of thoracic region: Secondary | ICD-10-CM | POA: Diagnosis not present

## 2018-06-20 DIAGNOSIS — M9901 Segmental and somatic dysfunction of cervical region: Secondary | ICD-10-CM | POA: Diagnosis not present

## 2018-06-20 DIAGNOSIS — M5417 Radiculopathy, lumbosacral region: Secondary | ICD-10-CM | POA: Diagnosis not present

## 2018-07-04 DIAGNOSIS — M5413 Radiculopathy, cervicothoracic region: Secondary | ICD-10-CM | POA: Diagnosis not present

## 2018-07-04 DIAGNOSIS — M5417 Radiculopathy, lumbosacral region: Secondary | ICD-10-CM | POA: Diagnosis not present

## 2018-07-04 DIAGNOSIS — M9902 Segmental and somatic dysfunction of thoracic region: Secondary | ICD-10-CM | POA: Diagnosis not present

## 2018-07-04 DIAGNOSIS — M9901 Segmental and somatic dysfunction of cervical region: Secondary | ICD-10-CM | POA: Diagnosis not present

## 2018-07-14 ENCOUNTER — Telehealth: Payer: Self-pay | Admitting: Internal Medicine

## 2018-07-14 NOTE — Telephone Encounter (Signed)
Patient reports that she was asked to try FD gard for her abdominal pain.  She reports that she is having pain, but never tried the FD gard.  She wants to know what to do.  She is asked to try FD gard and if her symptoms don't resolve call for an appt.

## 2018-07-18 DIAGNOSIS — M5413 Radiculopathy, cervicothoracic region: Secondary | ICD-10-CM | POA: Diagnosis not present

## 2018-07-18 DIAGNOSIS — M9902 Segmental and somatic dysfunction of thoracic region: Secondary | ICD-10-CM | POA: Diagnosis not present

## 2018-07-18 DIAGNOSIS — M5417 Radiculopathy, lumbosacral region: Secondary | ICD-10-CM | POA: Diagnosis not present

## 2018-07-18 DIAGNOSIS — M9901 Segmental and somatic dysfunction of cervical region: Secondary | ICD-10-CM | POA: Diagnosis not present

## 2018-07-21 ENCOUNTER — Other Ambulatory Visit: Payer: Self-pay

## 2018-07-21 MED ORDER — CYCLOBENZAPRINE HCL 5 MG PO TABS: 5.0000 mg | ORAL_TABLET | Freq: Two times a day (BID) | ORAL | 0 refills | Status: DC | PRN

## 2018-07-21 NOTE — Telephone Encounter (Signed)
Pt request refill cyclobenzaprine to [facility] [address]. Last refilled # 40 on 01/09/18. Pt takes for abd pain. Pt has 8 pills left so next wk is OK to refill. Pt last seen 05/30/18.

## 2018-07-21 NOTE — Progress Notes (Signed)
error 

## 2018-07-24 ENCOUNTER — Telehealth: Payer: Self-pay | Admitting: *Deleted

## 2018-07-24 NOTE — Telephone Encounter (Signed)
Received fax from [facility] requesting PA for Cyclobenzaprine 5 mg.  PA completed on CoverMyMeds and approved.  [facility] notified of approval via fax.

## 2018-07-25 NOTE — Telephone Encounter (Signed)
[name] with [employer] approved Cyclobenzaprine for 1 year.FYI to [facility] CMA.

## 2018-07-26 DIAGNOSIS — M9902 Segmental and somatic dysfunction of thoracic region: Secondary | ICD-10-CM | POA: Diagnosis not present

## 2018-07-26 DIAGNOSIS — M5413 Radiculopathy, cervicothoracic region: Secondary | ICD-10-CM | POA: Diagnosis not present

## 2018-07-26 DIAGNOSIS — M9901 Segmental and somatic dysfunction of cervical region: Secondary | ICD-10-CM | POA: Diagnosis not present

## 2018-07-26 DIAGNOSIS — M5417 Radiculopathy, lumbosacral region: Secondary | ICD-10-CM | POA: Diagnosis not present

## 2018-08-09 DIAGNOSIS — M5417 Radiculopathy, lumbosacral region: Secondary | ICD-10-CM | POA: Diagnosis not present

## 2018-08-09 DIAGNOSIS — M5413 Radiculopathy, cervicothoracic region: Secondary | ICD-10-CM | POA: Diagnosis not present

## 2018-08-09 DIAGNOSIS — M9902 Segmental and somatic dysfunction of thoracic region: Secondary | ICD-10-CM | POA: Diagnosis not present

## 2018-08-09 DIAGNOSIS — M9901 Segmental and somatic dysfunction of cervical region: Secondary | ICD-10-CM | POA: Diagnosis not present

## 2018-08-11 ENCOUNTER — Other Ambulatory Visit: Payer: Self-pay | Admitting: Family Medicine

## 2018-08-14 ENCOUNTER — Telehealth: Payer: Self-pay

## 2018-08-14 NOTE — Telephone Encounter (Signed)
Pt left v/m wanting to know if she has had testing for stomach pain for issues with pancreas. I do not see an amylase or lipase level lab test results. I spoke with pt and she has seen several doctors; pt had endoscopy and US done.pt saw the chiropractor last wk and he suggested testing of pancreas. After eats has abd discomfort on lt side of upper abd. Now can have achy dull pain on lt side of upper abd even when does not eat. Pain level now is 4. No fever. No N&V.pt had diarrhea on and off for 1 -2 months. Pt scheduled 30' appt with Dr Diona Browner 08/15/18 at 11:15. If pt condition changes or worsens prior to appt pt will go to UC. FYI to Dr Diona Browner.

## 2018-08-15 ENCOUNTER — Ambulatory Visit (INDEPENDENT_AMBULATORY_CARE_PROVIDER_SITE_OTHER): Payer: Medicare Other | Admitting: Family Medicine

## 2018-08-15 ENCOUNTER — Encounter: Payer: Self-pay | Admitting: Family Medicine

## 2018-08-15 VITALS — BP 130/70 | HR 70 | Resp 14 | Ht 64.0 in | Wt 191.2 lb

## 2018-08-15 DIAGNOSIS — R1012 Left upper quadrant pain: Secondary | ICD-10-CM | POA: Diagnosis not present

## 2018-08-15 LAB — POCT URINALYSIS DIPSTICK
Bilirubin, UA: NEGATIVE
Blood, UA: NEGATIVE
Glucose, UA: NEGATIVE
KETONES UA: NEGATIVE
Leukocytes, UA: NEGATIVE
Nitrite, UA: NEGATIVE
Protein, UA: NEGATIVE
Spec Grav, UA: 1.02 (ref 1.010–1.025)
Urobilinogen, UA: 0.2 E.U./dL
pH, UA: 6 (ref 5.0–8.0)

## 2018-08-15 LAB — COMPREHENSIVE METABOLIC PANEL
ALT: 28 U/L (ref 0–35)
AST: 24 U/L (ref 0–37)
Albumin: 4.3 g/dL (ref 3.5–5.2)
Alkaline Phosphatase: 46 U/L (ref 39–117)
BILIRUBIN TOTAL: 0.7 mg/dL (ref 0.2–1.2)
BUN: 16 mg/dL (ref 6–23)
CO2: 30 mEq/L (ref 19–32)
Calcium: 9.5 mg/dL (ref 8.4–10.5)
Chloride: 99 mEq/L (ref 96–112)
Creatinine, Ser: 0.86 mg/dL (ref 0.40–1.20)
GFR: 66.1 mL/min (ref >60.00)
Glucose, Bld: 95 mg/dL (ref 70–99)
Potassium: 3.5 mEq/L (ref 3.5–5.1)
Sodium: 138 mEq/L (ref 135–145)
Total Protein: 7.1 g/dL (ref 6.0–8.3)

## 2018-08-15 LAB — CBC WITH DIFFERENTIAL/PLATELET
Basophils Absolute: 0 10*3/uL (ref 0.0–0.1)
Basophils Relative: 0.6 % (ref 0.0–3.0)
Eosinophils Absolute: 0.1 10*3/uL (ref 0.0–0.7)
Eosinophils Relative: 1.9 % (ref 0.0–5.0)
HCT: 42.1 % (ref 36.0–46.0)
Hemoglobin: 14.4 g/dL (ref 12.0–15.0)
LYMPHS PCT: 32.7 % (ref 12.0–46.0)
Lymphs Abs: 2 10*3/uL (ref 0.7–4.0)
MCHC: 34.2 g/dL (ref 30.0–36.0)
MCV: 97.3 fl (ref 78.0–100.0)
Monocytes Absolute: 0.5 10*3/uL (ref 0.1–1.0)
Monocytes Relative: 8.5 % (ref 3.0–12.0)
Neutro Abs: 3.5 10*3/uL (ref 1.4–7.7)
Neutrophils Relative %: 56.3 % (ref 43.0–77.0)
Platelets: 258 10*3/uL (ref 150.0–400.0)
RBC: 4.32 Mil/uL (ref 3.87–5.11)
RDW: 12 % (ref 11.5–15.5)
WBC: 6.1 10*3/uL (ref 4.0–10.5)

## 2018-08-15 LAB — LIPASE: Lipase: 18 U/L (ref 11.0–59.0)

## 2018-08-15 NOTE — Patient Instructions (Addendum)
Please stop at the lab to have labs drawn.  If labs are negative we can consider either a return to Dr. Carlean Purl or CT abd.

## 2018-08-15 NOTE — Progress Notes (Signed)
Subjective:    Patient ID: [name], female    DOB: 1951/02/05, 68 y.o.   MRN: [id number]  HPI  68 year old female presents with chronic LUQ pain abdominal pain x months.  Iron def anemia. Pain is constant. Worse with eating. When worse 7/10, always there 4/10 on pain scale. Occ  back pain.  Occ diarrhea off on on  Feels some better given not eating as much.. smaller portions.   She had a negative abdominal ultrasound in July.  Trial of PPI... on prevacid.. has heartburn if she does not uses it.  She has been seeing [facility] Dr. Carlean Purl  ENDO performed on 04/18/2018: multiple gastric polyps  Colonoscopy:  2017  Wt Readings from Last 3 Encounters:  08/15/18 191 lb 4 oz (86.8 kg)  05/30/18 196 lb 4 oz (89 kg)  05/16/18 195 lb (88.5 kg)   Improved some with muscle relaxant at times  Blood pressure 130/70, pulse 70, resp. rate 14, height 5\' 4"  (1.626 m), weight 191 lb 4 oz (86.8 kg), SpO2 96 %.  Review of Systems  Constitutional: Negative for fatigue and fever.  HENT: Negative for congestion.   Eyes: Negative for pain.  Respiratory: Negative for cough and shortness of breath.   Cardiovascular: Negative for chest pain, palpitations and leg swelling.  Gastrointestinal: Positive for abdominal distention, abdominal pain and diarrhea. Negative for anal bleeding, blood in stool, nausea, rectal pain and vomiting.  Genitourinary: Negative for dysuria and vaginal bleeding.  Musculoskeletal: Negative for back pain.  Neurological: Negative for syncope, light-headedness and headaches.  Psychiatric/Behavioral: Negative for dysphoric mood.       Objective:   Physical Exam Constitutional:      General: She is not in acute distress.    Appearance: Normal appearance. She is well-developed. She is not ill-appearing or toxic-appearing.  HENT:     Head: Normocephalic.     Right Ear: Hearing, tympanic membrane, ear canal and external ear normal. Tympanic membrane is not erythematous,  retracted or bulging.     Left Ear: Hearing, tympanic membrane, ear canal and external ear normal. Tympanic membrane is not erythematous, retracted or bulging.     Nose: No mucosal edema or rhinorrhea.     Right Sinus: No maxillary sinus tenderness or frontal sinus tenderness.     Left Sinus: No maxillary sinus tenderness or frontal sinus tenderness.     Mouth/Throat:     Pharynx: Uvula midline.  Eyes:     General: Lids are normal. Lids are everted, no foreign bodies appreciated.     Conjunctiva/sclera: Conjunctivae normal.     Pupils: Pupils are equal, round, and reactive to light.  Neck:     Musculoskeletal: Normal range of motion and neck supple.     Thyroid: No thyroid mass or thyromegaly.     Vascular: No carotid bruit.     Trachea: Trachea normal.  Cardiovascular:     Rate and Rhythm: Normal rate and regular rhythm.     Pulses: Normal pulses.     Heart sounds: Normal heart sounds, S1 normal and S2 normal. No murmur. No friction rub. No gallop.   Pulmonary:     Effort: Pulmonary effort is normal. No tachypnea or respiratory distress.     Breath sounds: Normal breath sounds. No decreased breath sounds, wheezing, rhonchi or rales.  Abdominal:     General: Bowel sounds are normal.     Palpations: Abdomen is soft.     Tenderness: There is abdominal tenderness  in the epigastric area and left upper quadrant.  Skin:    General: Skin is warm and dry.     Findings: No rash.  Neurological:     Mental Status: She is alert.  Psychiatric:        Mood and Affect: Mood is not anxious or depressed.        Speech: Speech normal.        Behavior: Behavior normal. Behavior is cooperative.        Thought Content: Thought content normal.        Judgment: Judgment normal.           Assessment & Plan:

## 2018-08-15 NOTE — Assessment & Plan Note (Signed)
Neg Colonoscopy 2017   neg ENDO 04/2018  SE to [facility] Guard  Eval with CMET, cbc and lipase.  UA negative  If not improving consider further eval with CT or referral back to GI.

## 2018-08-15 NOTE — Addendum Note (Signed)
Addended by: Drucilla Schmidt on: 08/15/2018 12:15 PM   Modules accepted: Orders

## 2018-08-23 DIAGNOSIS — M9901 Segmental and somatic dysfunction of cervical region: Secondary | ICD-10-CM | POA: Diagnosis not present

## 2018-08-23 DIAGNOSIS — M9902 Segmental and somatic dysfunction of thoracic region: Secondary | ICD-10-CM | POA: Diagnosis not present

## 2018-08-23 DIAGNOSIS — M5413 Radiculopathy, cervicothoracic region: Secondary | ICD-10-CM | POA: Diagnosis not present

## 2018-08-23 DIAGNOSIS — M5417 Radiculopathy, lumbosacral region: Secondary | ICD-10-CM | POA: Diagnosis not present

## 2018-08-28 ENCOUNTER — Telehealth: Payer: Self-pay | Admitting: *Deleted

## 2018-08-28 NOTE — Telephone Encounter (Signed)
Patient left a voicemail stating that when she was last in the instructions on how she was to take her Metoprolol was changed. Patient stated that she was taking Metoprolol 1/4 twice a day. Patient stated that it was changed to 1/2 twice daily.   Patient stated that she is running low on the medication and needs a new script sent to the pharmacy. Please verify correct dose and directions and send to the pharmacy.There are 2 different doses and directions on the medication list. Pharmacy [facility]

## 2018-08-29 ENCOUNTER — Other Ambulatory Visit: Payer: Self-pay | Admitting: Family Medicine

## 2018-08-29 MED ORDER — METOPROLOL TARTRATE 25 MG PO TABS: 12.5000 mg | ORAL_TABLET | Freq: Two times a day (BID) | ORAL | 1 refills | Status: DC

## 2018-08-29 NOTE — Telephone Encounter (Signed)
Spoke with Ms. Caryl Pina.  She confirmed that she is currently taking Metoprolol 25 mg 1/2 tablet twice a day.  New Rx sent to [facility] with correct dosing instructions.

## 2018-08-29 NOTE — Telephone Encounter (Signed)
Please call pt. To verify dose.. is she taking 1/2 tab of the 12.5 mg or 25 mg twice daily? Okay to refill as she is taking for 1 year.

## 2018-09-01 ENCOUNTER — Telehealth: Payer: Self-pay

## 2018-09-01 NOTE — Telephone Encounter (Signed)
Pt left v/m that singulair refill has expired. I called [facility] and spoke with Casimer Bilis; Casimer Bilis said pt has rx for singulair for 08/11/18 on hold. Casimer Bilis will get ready for pick up. Pt request [facility] removed from med list. Pt will call [facility] [address] and have the refill transferred. And [facility] removed from pharmacy list.

## 2018-09-07 ENCOUNTER — Telehealth: Payer: Self-pay | Admitting: *Deleted

## 2018-09-07 NOTE — Telephone Encounter (Signed)
Returned the patient's call and scheduled [name] to see Dr. Alycia Rossetti. Patient stated "I have pagets, and the top left side is white. I have never seen that before. I think I need to be checked." Explained that we will call [name] the day before the appt and prescreen [name]. Explained that she will be asked those questions the day of the appt at the front desk along with a temperature. Also explained the no visitor policy

## 2018-09-12 ENCOUNTER — Telehealth: Payer: Self-pay | Admitting: *Deleted

## 2018-09-12 NOTE — Telephone Encounter (Signed)
Called and left the patient a message to call the office back. Need to move app up to earlier in the day per Dr. Alycia Rossetti.

## 2018-09-12 NOTE — Telephone Encounter (Signed)
Patient called back and was moved to 9am tomorrow

## 2018-09-13 ENCOUNTER — Inpatient Hospital Stay: Payer: Medicare Other | Attending: Gynecologic Oncology | Admitting: Gynecologic Oncology

## 2018-09-13 ENCOUNTER — Encounter: Payer: Self-pay | Admitting: Gynecologic Oncology

## 2018-09-13 ENCOUNTER — Other Ambulatory Visit: Payer: Self-pay

## 2018-09-13 VITALS — BP 151/88 | HR 70 | Temp 98.0°F | Resp 18 | Ht 64.0 in | Wt 190.0 lb

## 2018-09-13 DIAGNOSIS — C519 Malignant neoplasm of vulva, unspecified: Secondary | ICD-10-CM | POA: Diagnosis not present

## 2018-09-13 DIAGNOSIS — C4499 Other specified malignant neoplasm of skin, unspecified: Secondary | ICD-10-CM

## 2018-09-13 NOTE — Patient Instructions (Signed)
We will contact you with the results of your biopsy from today.  You may have a little spotting from the biopsy or grayish discharge from the medication used to stop the bleeding.  Please call the office for any questions or concerns.

## 2018-09-13 NOTE — Progress Notes (Signed)
Consult Note: Gyn-Onc   [name] 68 y.o. female  Chief Complaint  Patient presents with  . Paget's disease of vulva    Assessment : 68 year old with history of vulvar Paget's disease who now has a white raised hyperkeratotic lesion on her left labia minora.  It does not necessarily have the appearance of Paget's as it is white hyperkeratotic and does not have the red velvety look.  She knows that we will follow-up on the results of the biopsy from today and we will contact her with the results.  If this is something that needs to be dealt with surgically, she understands that this will be addressed later in June after the resolution of the COVID-19 pandemic.  HPI: Patient is a 68 year old with history of vulvar Paget's disease.  She is a patient of [name] and he most recently saw her in December 2019.  The patient initially underwent surgery for Paget's disease of the vulva in October 2011. There was focal involvement of the medial margin. The entire lesion is approximately 5 cm in diameter.   The patient previously had a supracervical hysterectomy but has her ovaries and cervix in situ.  Patient underwent wide local excision of a recurrent lesion on August 50,016 while all gross lesion was removed she did have positive surgical margins that were focal. Subsequently she underwent a another excision on 04/10/2015 with negative margins.  Vulva, excision, right anterior - EXTRAMAMMARY PAGET DISEASE. - RESECTION MARGINS ARE NEGATIVE. - NO INVASIVE CARCINOMA IDENTIFIED.  She did have a vulvar biopsy November 2017 that was negative.  This was on her right side.  She called stating that there is a new vulvar lesion and needed to be seen today.  She states that she looked at her vulva when she called to schedule the appointment.  She not really sure what prompted her to look but she saw a whitish area on the labia minora on her left side.  In retrospect she has had a little bit  of itching.  There is been no burning, bleeding, or discharge.  There is no symptoms on her right side.   Vitals: Blood pressure (!) 151/88, pulse 70, temperature 98 F (36.7 C), temperature source Oral, resp. rate 18, height _0  (1.626 m), weight 190 lb (86.2 kg), SpO2 95 %.  Physical Exam: General : The patient is a healthy woman in no acute distress.   External genitalia is notable for surgically absent right labia minora and portion of the majora.  There is a well-healed surgical incision.  There is no lesions on her right side.  On the left labia minora at the very tip at 12:00 there is approximately 1 cm raised whitish hyperkeratotic lesion.  After obtaining the patient's verbal consent the area was prepped with Betadine and anesthetized with 0.4 mils of 2% Xylocaine.  Using a cervical punch biopsy a biopsy was taken and sent for pathology.  Hemostasis was obtained using silver nitrate.  She tolerated the procedure well.  Allergies  Allergen Reactions  . Ibuprofen Hypertension  . Naprosyn [Naproxen] Nausea Only  . Other     Allergic to toothpaste- makes mouth peel Hay fever/dust mites/trees "365 allergic"   . Sevoflurane Nausea And Vomiting  . Azithromycin Itching and Rash  . Sulfa Antibiotics Hives and Rash    Past Medical History:  Diagnosis Date  . Allergic rhinitis   . Arthritis   . GERD (gastroesophageal reflux disease)   . Headache(784.0)  Migraines  . History of adenomatous polyp of colon   . History of concussion    AS CHILD--  NO RESIDUAL  . History of palpitations   . Hypertension   . Paget's disease of vulva   . PONV (postoperative nausea and vomiting)    SEVERE  . Wears glasses     Past Surgical History:  Procedure Laterality Date  . mucoid cyst removal thumb    . 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 PULLEY RELEASE RIGHT THUMB  03-23-2011  . SIMPLE VULVECTOMY   03-24-2010   right side  . TONSILLECTOMY  1977  . TRIGGER FINGER RELEASE    . VAGINAL HYSTERECTOMY  1989   and Anterior and posterior repair's for prolapse  . VULVECTOMY Right 01/10/2015   Procedure: RIGHT WIDE LOCAL EXCISION VULVECTOMY;  Surgeon: [name], MD;  [facility];  Service: Gynecology;  Laterality: Right;  Marland Kitchen VULVECTOMY Right 04/10/2015   Procedure: WIDE LOCAL EXCISION VULVA;  Surgeon: [name], MD;  Location: [facility];  Service: Gynecology;  Laterality: Right;  . WISDOM TOOTH EXTRACTION      Current Outpatient Medications  Medication Sig Dispense Refill  . albuterol (PROVENTIL HFA;VENTOLIN HFA) 108 (90 Base) MCG/ACT inhaler Inhale 2 puffs into the lungs every 6 (six) hours as needed. Reported on 10/24/2015 1 Inhaler 0  . azelastine (ASTELIN) 0.1 % nasal spray USE 1 SPRAY IN EACH NOSTRIL TWICE A DAY AS DIRECTED 90 mL 1  . cholecalciferol (VITAMIN D) 1000 units tablet Take 1,000 Units by mouth daily.    . clobetasol cream (TEMOVATE) 1.02 % Apply 1 application topically 2 (two) times daily. To the vulva 30 g 0  . cyclobenzaprine (FLEXERIL) 5 MG tablet Take 1-2 tablets (5-10 mg total) by mouth 2 (two) times daily as needed for muscle spasms. 40 tablet 0  . famotidine (PEPCID) 40 MG tablet TAKE 1/2 TABLET BY MOUTH TWICE A DAY 90 tablet 1  . hydrochlorothiazide (HYDRODIURIL) 25 MG tablet Take 25 mg by mouth daily.    . IRON PO Take by mouth daily.    . lansoprazole (PREVACID) 15 MG capsule Take 1 capsule (15 mg total) by mouth 2 (two) times daily before a meal.    . levocetirizine (XYZAL) 5 MG tablet Take 5 mg by mouth every evening.    Marland Kitchen losartan (COZAAR) 100 MG tablet Take 100 mg by mouth daily.    . metoprolol tartrate (LOPRESSOR) 25 MG tablet Take 0.5 tablets (12.5 mg total) by mouth 2 (two) times daily. 90 tablet 1  . montelukast (SINGULAIR) 10 MG tablet TAKE 1 TABLET BY MOUTH EVERY MORNING 90 tablet 0  .  Triamcinolone & Emollient (DERMASORB TA) 0.1 % KIT   0  . triamcinolone ointment (KENALOG) 0.5 % Apply 1 application topically 2 (two) times daily. 30 g 1   Current Facility-Administered Medications  Medication Dose Route Frequency Provider Last Rate Last Dose  . 0.9 %  sodium chloride infusion  500 mL Intravenous Once Gatha Mayer, MD        Social History   Socioeconomic History  . Marital status: Married    Spouse name: Not on file  . Number of children: Not on file  . Years of education: Not on file  . Highest education level: Not on file  Occupational History  . 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  Financial resource strain: Not on file  . Food insecurity:    Worry: Not on file    Inability: Not on file  . Transportation needs:    Medical: Not on file    Non-medical: Not on file  Tobacco Use  . Smoking status: Former Smoker    Years: 5.00    Types: Cigarettes    Last attempt to quit: 04/15/1989    Years since quitting: 29.4  . Smokeless tobacco: Never Used  Substance and Sexual Activity  . Alcohol use: Yes    Alcohol/week: 1.0 standard drinks    Types: 1 Glasses of wine per week    Comment: occasional wine  . Drug use: No  . Sexual activity: Not on file  Lifestyle  . Physical activity:    Days per week: Not on file    Minutes per session: Not on file  . Stress: Not on file  Relationships  . Social connections:    Talks on phone: Not on file    Gets together: Not on file    Attends religious service: Not on file    Active member of club or organization: Not on file    Attends meetings of clubs or organizations: Not on file    Relationship status: Not on file  . Intimate partner violence:    Fear of current or ex partner: Not on file    Emotionally abused: Not on file    Physically abused: Not on file    Forced sexual activity: Not on file  Other Topics Concern  . Not on file  Social History Narrative   She is married.  She  is a Forensic psychologist at [employer]   Occasional wine, former smoker not now no substances    Family History  Problem Relation Age of Onset  . Cancer Maternal Grandmother   . Hypertension Father   . Heart disease Father   . Asthma Father   . Hypertension Mother   . Heart disease Mother   . Breast cancer Paternal Grandmother 74  . Colon cancer Paternal Grandmother   . Colon cancer Paternal Uncle   . Allergic rhinitis Neg Hx   . Angioedema Neg Hx   . Eczema Neg Hx   . Immunodeficiency Neg Hx   . Urticaria Neg Hx   . Esophageal cancer Neg Hx   . Rectal cancer Neg Hx   . Stomach cancer Neg Hx       Myrl Bynum A., MD 09/13/2018, 9:41 AM

## 2018-09-15 ENCOUNTER — Telehealth: Payer: Self-pay

## 2018-09-15 NOTE — Telephone Encounter (Signed)
-----   Message from Dorothyann Gibbs, NP sent at 09/15/2018  7:26 AM EDT ----- Can you please let her know that her biopsy shows pagets disease, no cancer seen.  This would need minor surgical removal that would have to take place most likely in June when the surgeries are cleared to be performed again after COVID.  We will contact her when we have been cleared to perform surgeries along with the date and which surgeon if Dr. C-P is still in quarantine or not.  Thanks Mel ----- Message ----- From: Interface, Lab In [facility] Sent: 09/14/2018  12:41 PM EDT To: Dorothyann Gibbs, NP

## 2018-09-15 NOTE — Telephone Encounter (Signed)
Told Ms [name] the results of the biopsy as noted below by [name], NP. Pt verbalized understanding.

## 2018-09-20 NOTE — Telephone Encounter (Signed)
Pt seen 08/15/18.

## 2018-09-21 ENCOUNTER — Telehealth: Payer: Self-pay

## 2018-09-21 NOTE — Telephone Encounter (Signed)
[name] states she continues to use the clobetasol cream bid to the vulvar area with good effect with the itching. S just wanted to inform the physicians that she observes that when she has bx of the disease that it angers the paget's and increases the white spots and notices it spreads. She feels that she may have a larger surgical excision due to spread.    [name] will call if she needs a refill on her clobetasol cream as she has changed pharmacy to [facility]

## 2018-10-16 ENCOUNTER — Other Ambulatory Visit: Payer: Self-pay | Admitting: Family Medicine

## 2018-10-18 ENCOUNTER — Telehealth: Payer: Self-pay | Admitting: Gynecologic Oncology

## 2018-10-18 NOTE — Telephone Encounter (Signed)
Called patient to see about scheduling surgery for her paget's disease of the vulva with Dr. Alycia Rossetti. Patient agreeable with proceeding. Advised she would be contacted with a date and time.  Called patient back and informed her that we would know whether she could have her procedure on June 2 the week before when the block time would release. Advised she is currently scheduled on June 23 but we would try to move her up to June 2. Advised she would be updated on the situation. No concerns voiced. Advised to call for any needs or concerns.

## 2018-10-24 ENCOUNTER — Ambulatory Visit: Payer: Medicare Other | Admitting: Gynecologic Oncology

## 2018-10-31 ENCOUNTER — Telehealth: Payer: Self-pay

## 2018-10-31 ENCOUNTER — Telehealth: Payer: Self-pay | Admitting: Gynecologic Oncology

## 2018-10-31 NOTE — Telephone Encounter (Signed)
Left message for patient advising her that I was able to move her surgery up from June 23 to June 2. Advised she would be receiving a phone call from the pre-surgical RN a few days before her procedure to discuss instructions. Advised to call the office for any questions or concerns.

## 2018-10-31 NOTE — Telephone Encounter (Signed)
Confirmed with [name] that Maria John, NP scheduled her surgery for 11-07-18 at 0800 at the surgery center with Dr. Alycia Rossetti. She will receive a call from the nurse at the surgery center to go over instructions and when to come for her Pre op Covid19 testing.  Pt verbalized understanding.

## 2018-11-02 ENCOUNTER — Encounter (HOSPITAL_BASED_OUTPATIENT_CLINIC_OR_DEPARTMENT_OTHER): Payer: Self-pay | Admitting: *Deleted

## 2018-11-02 ENCOUNTER — Other Ambulatory Visit: Payer: Self-pay

## 2018-11-02 NOTE — Progress Notes (Signed)
Spoke with [name] after midnight, arrive 600 am 11-07-18 [facility] Has surgery orders ion epic Needs bmet and ekg day of surgery covid test scheduled 11-03-18 at 1135 am Spouse ray driver cell [phone]

## 2018-11-03 ENCOUNTER — Other Ambulatory Visit (HOSPITAL_COMMUNITY)
Admission: RE | Admit: 2018-11-03 | Discharge: 2018-11-03 | Disposition: A | Discharge status: Home / Self Care | Payer: Medicare Other | Source: Ambulatory Visit | Attending: Gynecologic Oncology | Admitting: Gynecologic Oncology

## 2018-11-03 DIAGNOSIS — Z1159 Encounter for screening for other viral diseases: Secondary | ICD-10-CM | POA: Diagnosis not present

## 2018-11-04 LAB — NOVEL CORONAVIRUS, NAA (HOSP ORDER, SEND-OUT TO REF LAB; TAT 18-24 HRS): SARS-CoV-2, NAA: NOT DETECTED

## 2018-11-06 NOTE — Progress Notes (Signed)
SPOKE W/  [name]     SCREENING SYMPTOMS OF COVID 19:   COUGH--No  RUNNY NOSE---  No  SORE THROAT---No  NASAL CONGESTION----No  SNEEZING----No   SHORTNESS OF BREATH---No  DIFFICULTY BREATHING---No  TEMP >100.0 -----No, 98.4 today   UNEXPLAINED BODY ACHES------No  CHILLS -------- No  HEADACHES ---------No  LOSS OF SMELL/ TASTE --------No    HAVE YOU OR ANY FAMILY MEMBER TRAVELLED PAST 14 DAYS OUT OF [place] last week STATE----No COUNTRY----No  HAVE YOU OR ANY FAMILY MEMBER BEEN EXPOSED TO ANYONE WITH COVID 19? No   Informed patient of negative COVID test results. Completed screening.  Lyndel Pleasure, RN

## 2018-11-06 NOTE — Anesthesia Preprocedure Evaluation (Addendum)
Anesthesia Evaluation  Patient identified by MRN, date of birth, ID band Patient awake    Reviewed: Allergy & Precautions, NPO status , Patient's Chart, lab work & pertinent test results, reviewed documented beta blocker date and time   History of Anesthesia Complications (+) PONV and history of anesthetic complications  Airway Mallampati: I  TM Distance: >3 FB Neck ROM: Full    Dental no notable dental hx. (+) Teeth Intact, Dental Advisory Given   Pulmonary asthma , former smoker,    Pulmonary exam normal breath sounds clear to auscultation       Cardiovascular hypertension, Pt. on home beta blockers and Pt. on medications negative cardio ROS Normal cardiovascular exam Rhythm:Regular Rate:Normal     Neuro/Psych negative neurological ROS  negative psych ROS   GI/Hepatic Neg liver ROS, GERD  Medicated,  Endo/Other  negative endocrine ROS  Renal/GU negative Renal ROS  negative genitourinary   Musculoskeletal  (+) Arthritis ,   Abdominal   Peds  Hematology negative hematology ROS (+)   Anesthesia Other Findings pagets of vulva  Reproductive/Obstetrics                            Anesthesia Physical Anesthesia Plan  ASA: II  Anesthesia Plan: General   Post-op Pain Management:    Induction: Intravenous  PONV Risk Score and Plan: 4 or greater and Ondansetron, Dexamethasone, TIVA, Midazolam and Scopolamine patch - Pre-op  Airway Management Planned: LMA  Additional Equipment:   Intra-op Plan:   Post-operative Plan: Extubation in OR  Informed Consent: I have reviewed the patients History and Physical, chart, labs and discussed the procedure including the risks, benefits and alternatives for the proposed anesthesia with the patient or authorized representative who has indicated his/her understanding and acceptance.     Dental advisory given  Plan Discussed with: CRNA  Anesthesia  Plan Comments:         Anesthesia Quick Evaluation

## 2018-11-07 ENCOUNTER — Ambulatory Visit (HOSPITAL_BASED_OUTPATIENT_CLINIC_OR_DEPARTMENT_OTHER)
Admission: RE | Admit: 2018-11-07 | Discharge: 2018-11-07 | Disposition: A | Discharge status: Home / Self Care | Payer: Medicare Other | Attending: Gynecologic Oncology | Admitting: Gynecologic Oncology

## 2018-11-07 ENCOUNTER — Encounter (HOSPITAL_BASED_OUTPATIENT_CLINIC_OR_DEPARTMENT_OTHER)
Admission: RE | Disposition: A | Discharge status: Home / Self Care | Payer: Self-pay | Source: Home / Self Care | Attending: Gynecologic Oncology

## 2018-11-07 ENCOUNTER — Other Ambulatory Visit: Payer: Self-pay

## 2018-11-07 ENCOUNTER — Ambulatory Visit (HOSPITAL_BASED_OUTPATIENT_CLINIC_OR_DEPARTMENT_OTHER): Payer: Medicare Other | Admitting: Anesthesiology

## 2018-11-07 ENCOUNTER — Encounter (HOSPITAL_BASED_OUTPATIENT_CLINIC_OR_DEPARTMENT_OTHER): Payer: Self-pay | Admitting: *Deleted

## 2018-11-07 DIAGNOSIS — Z87891 Personal history of nicotine dependence: Secondary | ICD-10-CM | POA: Insufficient documentation

## 2018-11-07 DIAGNOSIS — R002 Palpitations: Secondary | ICD-10-CM | POA: Insufficient documentation

## 2018-11-07 DIAGNOSIS — K219 Gastro-esophageal reflux disease without esophagitis: Secondary | ICD-10-CM | POA: Diagnosis not present

## 2018-11-07 DIAGNOSIS — Z79899 Other long term (current) drug therapy: Secondary | ICD-10-CM | POA: Insufficient documentation

## 2018-11-07 DIAGNOSIS — D649 Anemia, unspecified: Secondary | ICD-10-CM | POA: Diagnosis not present

## 2018-11-07 DIAGNOSIS — Z7951 Long term (current) use of inhaled steroids: Secondary | ICD-10-CM | POA: Diagnosis not present

## 2018-11-07 DIAGNOSIS — Z86010 Personal history of colonic polyps: Secondary | ICD-10-CM | POA: Insufficient documentation

## 2018-11-07 DIAGNOSIS — I10 Essential (primary) hypertension: Secondary | ICD-10-CM | POA: Diagnosis not present

## 2018-11-07 DIAGNOSIS — Z888 Allergy status to other drugs, medicaments and biological substances status: Secondary | ICD-10-CM | POA: Insufficient documentation

## 2018-11-07 DIAGNOSIS — M1990 Unspecified osteoarthritis, unspecified site: Secondary | ICD-10-CM | POA: Insufficient documentation

## 2018-11-07 DIAGNOSIS — Z90710 Acquired absence of both cervix and uterus: Secondary | ICD-10-CM | POA: Insufficient documentation

## 2018-11-07 DIAGNOSIS — J45909 Unspecified asthma, uncomplicated: Secondary | ICD-10-CM | POA: Diagnosis not present

## 2018-11-07 DIAGNOSIS — C519 Malignant neoplasm of vulva, unspecified: Secondary | ICD-10-CM | POA: Diagnosis not present

## 2018-11-07 DIAGNOSIS — Z886 Allergy status to analgesic agent status: Secondary | ICD-10-CM | POA: Diagnosis not present

## 2018-11-07 DIAGNOSIS — C511 Malignant neoplasm of labium minus: Secondary | ICD-10-CM | POA: Diagnosis not present

## 2018-11-07 DIAGNOSIS — Z882 Allergy status to sulfonamides status: Secondary | ICD-10-CM | POA: Diagnosis not present

## 2018-11-07 DIAGNOSIS — Z881 Allergy status to other antibiotic agents status: Secondary | ICD-10-CM | POA: Insufficient documentation

## 2018-11-07 DIAGNOSIS — G43909 Migraine, unspecified, not intractable, without status migrainosus: Secondary | ICD-10-CM | POA: Diagnosis not present

## 2018-11-07 HISTORY — DX: Unspecified asthma, uncomplicated: J45.909

## 2018-11-07 HISTORY — PX: VULVECTOMY PARTIAL: SHX6187

## 2018-11-07 LAB — POCT I-STAT, CHEM 8
BUN: 13 mg/dL (ref 8–23)
Calcium, Ion: 1.24 mmol/L (ref 1.15–1.40)
Chloride: 102 mmol/L (ref 98–111)
Creatinine, Ser: 0.8 mg/dL (ref 0.44–1.00)
Glucose, Bld: 102 mg/dL — ABNORMAL HIGH (ref 70–99)
HCT: 41 % (ref 36.0–46.0)
Hemoglobin: 13.9 g/dL (ref 12.0–15.0)
Potassium: 3.4 mmol/L — ABNORMAL LOW (ref 3.5–5.1)
Sodium: 141 mmol/L (ref 135–145)
TCO2: 26 mmol/L (ref 22–32)

## 2018-11-07 SURGERY — VULVECTOMY, PARTIAL
Anesthesia: General | Site: Vulva

## 2018-11-07 MED ORDER — ACETIC ACID 5 % SOLN
Status: DC | PRN
  Administered 2018-11-07: 1 via TOPICAL

## 2018-11-07 MED ORDER — ACETAMINOPHEN 500 MG PO TABS
1000.0000 mg | ORAL_TABLET | Freq: Once | ORAL | Status: AC
  Administered 2018-11-07: 1000 mg via ORAL
  Filled 2018-11-07: qty 2

## 2018-11-07 MED ORDER — LACTATED RINGERS IV SOLN
INTRAVENOUS | Status: DC
  Administered 2018-11-07: 07:00:00 via INTRAVENOUS
  Filled 2018-11-07: qty 1000

## 2018-11-07 MED ORDER — OXYCODONE-ACETAMINOPHEN 5-325 MG PO TABS: 1.0000 | ORAL_TABLET | Freq: Four times a day (QID) | ORAL | 0 refills | Status: DC | PRN

## 2018-11-07 MED ORDER — DEXAMETHASONE SODIUM PHOSPHATE 10 MG/ML IJ SOLN
INTRAMUSCULAR | Status: AC
  Filled 2018-11-07: qty 1

## 2018-11-07 MED ORDER — FENTANYL CITRATE (PF) 100 MCG/2ML IJ SOLN
INTRAMUSCULAR | Status: DC | PRN
  Administered 2018-11-07: 50 ug via INTRAVENOUS

## 2018-11-07 MED ORDER — MIDAZOLAM HCL 2 MG/2ML IJ SOLN
INTRAMUSCULAR | Status: AC
  Filled 2018-11-07: qty 2

## 2018-11-07 MED ORDER — ONDANSETRON HCL 4 MG/2ML IJ SOLN
INTRAMUSCULAR | Status: AC
  Filled 2018-11-07: qty 2

## 2018-11-07 MED ORDER — FENTANYL CITRATE (PF) 100 MCG/2ML IJ SOLN
25.0000 ug | INTRAMUSCULAR | Status: DC | PRN
  Filled 2018-11-07: qty 1

## 2018-11-07 MED ORDER — BUPIVACAINE HCL (PF) 0.5 % IJ SOLN
INTRAMUSCULAR | Status: AC
  Filled 2018-11-07: qty 30

## 2018-11-07 MED ORDER — PROPOFOL 500 MG/50ML IV EMUL
INTRAVENOUS | Status: AC
  Filled 2018-11-07: qty 50

## 2018-11-07 MED ORDER — ONDANSETRON HCL 4 MG/2ML IJ SOLN
INTRAMUSCULAR | Status: DC | PRN
  Administered 2018-11-07: 4 mg via INTRAVENOUS

## 2018-11-07 MED ORDER — ACETAMINOPHEN 500 MG PO TABS
ORAL_TABLET | ORAL | Status: AC
  Filled 2018-11-07: qty 2

## 2018-11-07 MED ORDER — SCOPOLAMINE 1 MG/3DAYS TD PT72
MEDICATED_PATCH | TRANSDERMAL | Status: AC
  Filled 2018-11-07: qty 1

## 2018-11-07 MED ORDER — PROPOFOL 500 MG/50ML IV EMUL
INTRAVENOUS | Status: DC | PRN
  Administered 2018-11-07: 150 ug/kg/min via INTRAVENOUS

## 2018-11-07 MED ORDER — DEXAMETHASONE SODIUM PHOSPHATE 10 MG/ML IJ SOLN
INTRAMUSCULAR | Status: DC | PRN
  Administered 2018-11-07: 10 mg via INTRAVENOUS

## 2018-11-07 MED ORDER — FENTANYL CITRATE (PF) 100 MCG/2ML IJ SOLN
INTRAMUSCULAR | Status: AC
  Filled 2018-11-07: qty 2

## 2018-11-07 MED ORDER — WHITE PETROLATUM EX OINT
TOPICAL_OINTMENT | CUTANEOUS | Status: AC
  Filled 2018-11-07: qty 5

## 2018-11-07 MED ORDER — LIDOCAINE 2% (20 MG/ML) 5 ML SYRINGE
INTRAMUSCULAR | Status: DC | PRN
  Administered 2018-11-07: 100 mg via INTRAVENOUS

## 2018-11-07 MED ORDER — MIDAZOLAM HCL 2 MG/2ML IJ SOLN
INTRAMUSCULAR | Status: DC | PRN
  Administered 2018-11-07: 2 mg via INTRAVENOUS

## 2018-11-07 MED ORDER — ACETIC ACID 5 % SOLN
Status: AC
  Filled 2018-11-07: qty 500

## 2018-11-07 MED ORDER — SCOPOLAMINE 1 MG/3DAYS TD PT72
1.0000 | MEDICATED_PATCH | TRANSDERMAL | Status: DC
  Administered 2018-11-07: 1.5 mg via TRANSDERMAL
  Filled 2018-11-07: qty 1

## 2018-11-07 MED ORDER — BUPIVACAINE HCL 0.5 % IJ SOLN
INTRAMUSCULAR | Status: DC | PRN
  Administered 2018-11-07: 5 mL

## 2018-11-07 MED ORDER — LIDOCAINE 2% (20 MG/ML) 5 ML SYRINGE
INTRAMUSCULAR | Status: AC
  Filled 2018-11-07: qty 5

## 2018-11-07 MED ORDER — GABAPENTIN 300 MG PO CAPS
ORAL_CAPSULE | ORAL | Status: AC
  Filled 2018-11-07: qty 1

## 2018-11-07 MED ORDER — PROPOFOL 10 MG/ML IV BOLUS
INTRAVENOUS | Status: DC | PRN
  Administered 2018-11-07: 200 mg via INTRAVENOUS

## 2018-11-07 MED ORDER — KETOROLAC TROMETHAMINE 30 MG/ML IJ SOLN
INTRAMUSCULAR | Status: AC
  Filled 2018-11-07: qty 1

## 2018-11-07 MED ORDER — GABAPENTIN 300 MG PO CAPS
300.0000 mg | ORAL_CAPSULE | Freq: Once | ORAL | Status: AC
  Administered 2018-11-07: 300 mg via ORAL
  Filled 2018-11-07: qty 1

## 2018-11-07 SURGICAL SUPPLY — 23 items
BLADE SURG 15 STRL LF DISP TIS (BLADE) ×1 IMPLANT
BLADE SURG 15 STRL SS (BLADE) ×1
CANISTER SUCT 3000ML PPV (MISCELLANEOUS) IMPLANT
CANISTER SUCTION 1200CC (MISCELLANEOUS) IMPLANT
CATH ROBINSON RED A/P 16FR (CATHETERS) IMPLANT
COVER WAND RF STERILE (DRAPES) ×2 IMPLANT
GAUZE 4X4 16PLY RFD (DISPOSABLE) ×2 IMPLANT
GLOVE BIO SURGEON STRL SZ 6.5 (GLOVE) ×4 IMPLANT
GLOVE BIOGEL PI IND STRL 7.0 (GLOVE) ×1 IMPLANT
GLOVE BIOGEL PI INDICATOR 7.0 (GLOVE) ×1
GOWN STRL REUS W/TWL LRG LVL3 (GOWN DISPOSABLE) ×2 IMPLANT
KIT TURNOVER CYSTO (KITS) ×2 IMPLANT
NEEDLE HYPO 25X1 1.5 SAFETY (NEEDLE) IMPLANT
NS IRRIG 500ML POUR BTL (IV SOLUTION) IMPLANT
PACK VAGINAL WOMENS (CUSTOM PROCEDURE TRAY) ×2 IMPLANT
PAD ABD 8X10 STRL (GAUZE/BANDAGES/DRESSINGS) ×2 IMPLANT
PAD OB MATERNITY 4.3X12.25 (PERSONAL CARE ITEMS) ×2 IMPLANT
SUT VIC AB 2-0 SH 27 (SUTURE) ×1
SUT VIC AB 2-0 SH 27XBRD (SUTURE) ×1 IMPLANT
SUT VIC AB 3-0 SH 27 (SUTURE) ×1
SUT VIC AB 3-0 SH 27X BRD (SUTURE) ×1 IMPLANT
TOWEL OR 17X26 10 PK STRL BLUE (TOWEL DISPOSABLE) ×4 IMPLANT
WATER STERILE IRR 500ML POUR (IV SOLUTION) ×2 IMPLANT

## 2018-11-07 NOTE — Anesthesia Procedure Notes (Signed)
Procedure Name: LMA Insertion Date/Time: 11/07/2018 8:19 AM Performed by: Suan Halter, CRNA Pre-anesthesia Checklist: Patient identified, Emergency Drugs available, Suction available and Patient being monitored Patient Re-evaluated:Patient Re-evaluated prior to induction Oxygen Delivery Method: Circle system utilized Preoxygenation: Pre-oxygenation with 100% oxygen Induction Type: IV induction Ventilation: Mask ventilation without difficulty LMA: LMA inserted LMA Size: 4.0 Number of attempts: 1 Airway Equipment and Method: Bite block Placement Confirmation: positive ETCO2 Tube secured with: Tape Dental Injury: Teeth and Oropharynx as per pre-operative assessment

## 2018-11-07 NOTE — Anesthesia Postprocedure Evaluation (Signed)
Anesthesia Post Note  Patient: [name]  Procedure(s) Performed: VULVECTOMY PARTIAL (N/A Vulva)     Patient location during evaluation: PACU Anesthesia Type: General Level of consciousness: awake and alert Pain management: pain level controlled Vital Signs Assessment: post-procedure vital signs reviewed and stable Respiratory status: spontaneous breathing, nonlabored ventilation, respiratory function stable and patient connected to nasal cannula oxygen Cardiovascular status: blood pressure returned to baseline and stable Postop Assessment: no apparent nausea or vomiting Anesthetic complications: no    Last Vitals:  Vitals:   11/07/18 0918 11/07/18 0955  BP:  126/75  Pulse: 63 (!) 50  Resp: 16 13  Temp:  (!) 36.3 C  SpO2: 92% 97%    Last Pain:  Vitals:   11/07/18 0955  TempSrc:   PainSc: 2                  Laker Thompson L Conlan Miceli

## 2018-11-07 NOTE — Interval H&P Note (Signed)
History and Physical Interval Note:  11/07/2018 8:08 AM  [name]  has presented today for surgery, with the diagnosis of PAGETS OF THE VULVA.  The various methods of treatment have been discussed with the patient and family. After consideration of risks, benefits and other options for treatment, the patient has consented to  Procedure(s): VULVECTOMY PARTIAL (N/A) as a surgical intervention.  The patient's history has been reviewed, patient examined, no change in status, stable for surgery.  I have reviewed the patient's chart and labs.  Questions were answered to the patient's satisfaction.     Anselmo A.

## 2018-11-07 NOTE — Op Note (Signed)
PATIENT: [name] DATE OF BIRTH: 07/31/1950 ENCOUNTER DATE: 11/07/18   Preop Diagnosis: Paget's disease  Postoperative Diagnosis: Same  Surgery: Partial left vulvectomy  Surgeons:  Imagene Gurney A. Alycia Rossetti, MD  Anesthesia: IV sedation, LMA   Estimated blood loss: < 10 ml  IVF: 400 ml   Urine output: 10 ml   Complications: None   Pathology: Left vulva  Operative findings: 1 cm white hyperkeratotic lesion on left labia minora close to clitoris. No other lesions were appreciated with close inspection of the vulva before and after the application of acetic acid, post operative changes throughout the vulva.  Procedure: The patient was identified in the preoperative holding area. Informed consent was signed on the chart. Patient was seen history was reviewed and exam was performed.   The patient was then taken to the operating room and placed in the supine position with SCD hose on. Anesthesia was then induced without difficulty. She was then placed in the dorsolithotomy position.  Perineum was prepped with hibiclens.   The patient was then draped after the prep was dried. Timeout was performed the patient, procedure, antibiotic, allergy, and length of procedure. 4 mL 0.5% marcaine was injected. An elliptical incision with a 1 cm visible border was made with the knife. The margin was close to the clitoris and we had less margin at that location, but did not involve the clitoris. The elliptical lesion was removed with the knife. Hemostasis was obtained with cautery. The incision was closed with 3.0 vicryl in a running fashion. An additional 1 ml of marcaine was injected along the suture line.  All instrument, suture, laparotomy, Ray-Tec, and needle counts were correct x2. The patient tolerated the procedure well and was taken recovery room in stable condition. This is [name] dictating an operative note on [name].

## 2018-11-07 NOTE — H&P (Signed)
Consult Note: Gyn-Onc   [name] 68 y.o. female     Chief Complaint  Patient presents with  . Paget's disease of vulva    Assessment : 68 year old with history of vulvar Paget's disease who now has a white raised hyperkeratotic lesion on her left labia minora.  It does not necessarily have the appearance of Paget's as it is white hyperkeratotic and does not have the red velvety look.  She knows that we will follow-up on the results of the biopsy from today and we will contact her with the results.  If this is something that needs to be dealt with surgically, she understands that this will be addressed later in June after the resolution of the COVID-19 pandemic.  HPI: Patient is a 68 year old with history of vulvar Paget's disease.  She is a patient of [name] and he most recently saw her in December 2019.  The patient initially underwent surgery for Paget's disease of the vulva in October 2011. There was focal involvement of the medial margin. The entire lesion is approximately 5 cm in diameter.   The patient previously had a supracervical hysterectomy but has her ovaries and cervix in situ.  Patient underwent wide local excision of a recurrent lesion on August 50,016 while all gross lesion was removed she did have positive surgical margins that were focal. Subsequently she underwent a another excision on 04/10/2015 with negative margins.  Vulva, excision, right anterior - EXTRAMAMMARY PAGET DISEASE. - RESECTION MARGINS ARE NEGATIVE. - NO INVASIVE CARCINOMA IDENTIFIED.  She did have a vulvar biopsy November 2017 that was negative.  This was on her right side.  She called stating that there is a new vulvar lesion and needed to be seen today.  She states that she looked at her vulva when she called to schedule the appointment.  She not really sure what prompted her to look but she saw a whitish area on the labia minora on her left side.  In retrospect she has had  a little bit of itching.  There is been no burning, bleeding, or discharge.  There is no symptoms on her right side.   Vitals: Blood pressure (!) 151/88, pulse 70, temperature 98 F (36.7 C), temperature source Oral, resp. rate 18, height 5' 4"  (1.626 m), weight 190 lb (86.2 kg), SpO2 95 %.  Physical Exam: General : The patient is a healthy woman in no acute distress.   External genitalia is notable for surgically absent right labia minora and portion of the majora.  There is a well-healed surgical incision.  There is no lesions on her right side.  On the left labia minora at the very tip at 12:00 there is approximately 1 cm raised whitish hyperkeratotic lesion.  After obtaining the patient's verbal consent the area was prepped with Betadine and anesthetized with 0.4 mils of 2% Xylocaine.  Using a cervical punch biopsy a biopsy was taken and sent for pathology.  Hemostasis was obtained using silver nitrate.  She tolerated the procedure well.       Allergies  Allergen Reactions  . Ibuprofen Hypertension  . Naprosyn [Naproxen] Nausea Only  . Other     Allergic to toothpaste- makes mouth peel Hay fever/dust mites/trees "365 allergic"   . Sevoflurane Nausea And Vomiting  . Azithromycin Itching and Rash  . Sulfa Antibiotics Hives and Rash        Past Medical History:  Diagnosis Date  . Allergic rhinitis   . Arthritis   .  GERD (gastroesophageal reflux disease)   . Headache(784.0)    Migraines  . History of adenomatous polyp of colon   . History of concussion    AS CHILD--  NO RESIDUAL  . History of palpitations   . Hypertension   . Paget's disease of vulva   . PONV (postoperative nausea and vomiting)    SEVERE  . Wears glasses          Past Surgical History:  Procedure Laterality Date  . mucoid cyst removal thumb    . 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  PULLEY RELEASE RIGHT THUMB  03-23-2011  . SIMPLE VULVECTOMY  03-24-2010   right side  . TONSILLECTOMY  1977  . TRIGGER FINGER RELEASE    . VAGINAL HYSTERECTOMY  1989   and Anterior and posterior repair's for prolapse  . VULVECTOMY Right 01/10/2015   Procedure: RIGHT WIDE LOCAL EXCISION VULVECTOMY;  Surgeon: [name], MD;  Location: [facility];  Service: Gynecology;  Laterality: Right;  Marland Kitchen VULVECTOMY Right 04/10/2015   Procedure: WIDE LOCAL EXCISION VULVA;  Surgeon: [name], MD;  Location: [facility];  Service: Gynecology;  Laterality: Right;  . WISDOM TOOTH EXTRACTION            Current Outpatient Medications  Medication Sig Dispense Refill  . albuterol (PROVENTIL HFA;VENTOLIN HFA) 108 (90 Base) MCG/ACT inhaler Inhale 2 puffs into the lungs every 6 (six) hours as needed. Reported on 10/24/2015 1 Inhaler 0  . azelastine (ASTELIN) 0.1 % nasal spray USE 1 SPRAY IN EACH NOSTRIL TWICE A DAY AS DIRECTED 90 mL 1  . cholecalciferol (VITAMIN D) 1000 units tablet Take 1,000 Units by mouth daily.    . clobetasol cream (TEMOVATE) 8.25 % Apply 1 application topically 2 (two) times daily. To the vulva 30 g 0  . cyclobenzaprine (FLEXERIL) 5 MG tablet Take 1-2 tablets (5-10 mg total) by mouth 2 (two) times daily as needed for muscle spasms. 40 tablet 0  . famotidine (PEPCID) 40 MG tablet TAKE 1/2 TABLET BY MOUTH TWICE A DAY 90 tablet 1  . hydrochlorothiazide (HYDRODIURIL) 25 MG tablet Take 25 mg by mouth daily.    . IRON PO Take by mouth daily.    . lansoprazole (PREVACID) 15 MG capsule Take 1 capsule (15 mg total) by mouth 2 (two) times daily before a meal.    . levocetirizine (XYZAL) 5 MG tablet Take 5 mg by mouth every evening.    Marland Kitchen losartan (COZAAR) 100 MG tablet Take 100 mg by mouth daily.    . metoprolol tartrate (LOPRESSOR) 25 MG tablet Take 0.5 tablets (12.5 mg total) by mouth 2 (two) times daily. 90 tablet 1  .  montelukast (SINGULAIR) 10 MG tablet TAKE 1 TABLET BY MOUTH EVERY MORNING 90 tablet 0  . Triamcinolone & Emollient (DERMASORB TA) 0.1 % KIT   0  . triamcinolone ointment (KENALOG) 0.5 % Apply 1 application topically 2 (two) times daily. 30 g 1            Current Facility-Administered Medications  Medication Dose Route Frequency Provider Last Rate Last Dose  . 0.9 %  sodium chloride infusion  500 mL Intravenous Once Gatha Mayer, MD        Social History   Socioeconomic History  . Marital status: Married    Spouse name: Not on file  . Number of children: Not on file  .  Years of education: Not on file  . Highest education level: Not on file  Occupational History  . 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 Financial resource strain: Not on file  . Food insecurity:    Worry: Not on file    Inability: Not on file  . Transportation needs:    Medical: Not on file    Non-medical: Not on file  Tobacco Use  . Smoking status: Former Smoker    Years: 5.00    Types: Cigarettes    Last attempt to quit: 04/15/1989    Years since quitting: 29.4  . Smokeless tobacco: Never Used  Substance and Sexual Activity  . Alcohol use: Yes    Alcohol/week: 1.0 standard drinks    Types: 1 Glasses of wine per week    Comment: occasional wine  . Drug use: No  . Sexual activity: Not on file  Lifestyle  . Physical activity:    Days per week: Not on file    Minutes per session: Not on file  . Stress: Not on file  Relationships  . Social connections:    Talks on phone: Not on file    Gets together: Not on file    Attends religious service: Not on file    Active member of club or organization: Not on file    Attends meetings of clubs or organizations: Not on file    Relationship status: Not on file  . Intimate partner violence:    Fear of current or ex partner: Not on file    Emotionally abused: Not on file     Physically abused: Not on file    Forced sexual activity: Not on file  Other Topics Concern  . Not on file  Social History Narrative   She is married.  She is a Forensic psychologist at [employer]   Occasional wine, former smoker not now no substances         Family History  Problem Relation Age of Onset  . Cancer Maternal Grandmother   . Hypertension Father   . Heart disease Father   . Asthma Father   . Hypertension Mother   . Heart disease Mother   . Breast cancer Paternal Grandmother 43  . Colon cancer Paternal Grandmother   . Colon cancer Paternal Uncle   . Allergic rhinitis Neg Hx   . Angioedema Neg Hx   . Eczema Neg Hx   . Immunodeficiency Neg Hx   . Urticaria Neg Hx   . Esophageal cancer Neg Hx   . Rectal cancer Neg Hx   . Stomach cancer Neg Hx

## 2018-11-07 NOTE — Transfer of Care (Signed)
Immediate Anesthesia Transfer of Care Note  Patient: [name]  Procedure(s) Performed: Procedure(s) (LRB): VULVECTOMY PARTIAL (N/A)  Patient Location: PACU  Anesthesia Type: General  Level of Consciousness: awake, oriented, sedated and patient cooperative  Airway & Oxygen Therapy: Patient Spontanous Breathing and Patient connected to face mask oxygen  Post-op Assessment: Report given to PACU RN and Post -op Vital signs reviewed and stable  Post vital signs: Reviewed and stable  Complications: No apparent anesthesia complications Last Vitals:  Vitals Value Taken Time  BP    Temp    Pulse 62 11/07/2018  8:47 AM  Resp 18 11/07/2018  8:47 AM  SpO2 94 % 11/07/2018  8:47 AM  Vitals shown include unvalidated device data.  Last Pain:  Vitals:   11/07/18 0558  TempSrc: Oral

## 2018-11-07 NOTE — Discharge Instructions (Signed)

## 2018-11-09 ENCOUNTER — Encounter (HOSPITAL_BASED_OUTPATIENT_CLINIC_OR_DEPARTMENT_OTHER): Payer: Self-pay | Admitting: Gynecologic Oncology

## 2018-11-09 ENCOUNTER — Telehealth: Payer: Self-pay | Admitting: Gynecologic Oncology

## 2018-11-09 NOTE — Telephone Encounter (Signed)
Called patient to check on post-op status.  She states she is doing well with no pain. Informed her of final path results.  Per Dr. Alycia Rossetti: I would recommend aldara once she heals but if she wants a repeat excision, it would involve removal of the clitoris. Patient appreciates the explanation.  All questions answered. Advised to call for any needs. Advised that if Dr. Judeth Porch opens a clinic earlier in July we will move her appt from July 28 to a sooner date for a post-op check.

## 2018-11-15 ENCOUNTER — Telehealth: Payer: Self-pay | Admitting: *Deleted

## 2018-11-15 NOTE — Telephone Encounter (Signed)
Called and moved the post op appt up to 7/8 with Dr. Fermin Schwab. Explained that we may have to move appt with he's not seeing patients in clinic

## 2018-11-28 ENCOUNTER — Ambulatory Visit: Payer: Medicare Other | Admitting: Gynecology

## 2018-11-28 ENCOUNTER — Other Ambulatory Visit: Payer: Self-pay | Admitting: Family Medicine

## 2018-11-30 ENCOUNTER — Other Ambulatory Visit: Payer: Self-pay | Admitting: *Deleted

## 2018-11-30 ENCOUNTER — Other Ambulatory Visit: Payer: Self-pay | Admitting: Oncology

## 2018-11-30 DIAGNOSIS — L292 Pruritus vulvae: Secondary | ICD-10-CM

## 2018-11-30 MED ORDER — CLOBETASOL PROPIONATE 0.05 % EX CREA: 1.0000 "application " | TOPICAL_CREAM | Freq: Two times a day (BID) | CUTANEOUS | 0 refills | Status: DC

## 2018-11-30 NOTE — Telephone Encounter (Signed)
Called [name] and let her know that the refill for clobetasol has been sent to [facility]. She verbalized understanding and agreement.

## 2018-11-30 NOTE — Telephone Encounter (Signed)
Patient called and request a refill on her clobetasol. Patient stated "I have a pins and needles feeling there at least ten times a day. [name] recommend a cream (aldara) for after I healed I may could use. I don't know which would be better." Message forwarded to Dr. Alycia Rossetti. Dr. Alycia Rossetti did surgery on 6/2, partial vulvectomy.

## 2018-12-11 ENCOUNTER — Telehealth: Payer: Self-pay | Admitting: *Deleted

## 2018-12-11 NOTE — Telephone Encounter (Signed)
Called and spoke with the patient, moved her appt from Dr. Fermin Schwab to [facility] APP

## 2018-12-13 ENCOUNTER — Other Ambulatory Visit: Payer: Self-pay

## 2018-12-13 ENCOUNTER — Telehealth: Payer: Self-pay | Admitting: *Deleted

## 2018-12-13 ENCOUNTER — Inpatient Hospital Stay: Payer: Medicare Other | Attending: Gynecologic Oncology | Admitting: Gynecologic Oncology

## 2018-12-13 ENCOUNTER — Encounter: Payer: Self-pay | Admitting: Gynecologic Oncology

## 2018-12-13 VITALS — BP 153/82 | HR 57 | Temp 98.0°F | Resp 18 | Ht 64.0 in | Wt 188.4 lb

## 2018-12-13 DIAGNOSIS — C4499 Other specified malignant neoplasm of skin, unspecified: Secondary | ICD-10-CM

## 2018-12-13 DIAGNOSIS — Z9079 Acquired absence of other genital organ(s): Secondary | ICD-10-CM

## 2018-12-13 DIAGNOSIS — C519 Malignant neoplasm of vulva, unspecified: Secondary | ICD-10-CM

## 2018-12-13 NOTE — Patient Instructions (Signed)
Continue with your vulvar care.  Your incision is healing nicely.  Plan to follow up on July 28 with Dr. Alycia Rossetti or sooner if needed.  Please call for any questions or concerns.

## 2018-12-13 NOTE — Telephone Encounter (Signed)
Patient called and left a message to call her back. She stated that "I have some questions from my appt." Message forwarded to the desk RN

## 2018-12-13 NOTE — Telephone Encounter (Signed)
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 This condition did not cause or is linked to the Paget's disease per [facility],NP.  The cream that may be prescribed at her next visit is Efudex and is a topical chemotherapy.

## 2018-12-14 NOTE — Progress Notes (Signed)
Gynecologic Oncology Follow Up  S: Patient presents today for incision check/post-op follow up s/p left partial vulvectomy on 11/07/2018 with Dr. [name] for Paget's disease of the vulva.  She states she has been doing well and feels she is almost healed.  She still has itching on the vulva and mild burning around the clitorial region.  She has been using the peri bottle when urinating which helped with her symptoms.  No other concerns voiced.  O: Alert, oriented x3. In no acute distress.  Lungs clear. Heart regular rate and rhythm.  External genitalia is notable for surgically absent right labia minora and portion of the majora. Left vulvar incision well healed. Minimal drainage present. No evidence of infection. No edema in the lower extremities.  A: Paget's Disease of the Vulva s/p partial vulvectomy on 11/07/2018  P: Healing well.  Plan is to continue with vulvar care and follow up with Dr. [name] on 01/02/2019 or sooner if needed.  Plan moving forward per Dr. Alycia Rossetti will be for consideration of aldara to the vulva since repeat excision could involve removal of the clitoris. Patient verbalizing understanding and no concerns voiced. Advised to call for any needs or concerns.

## 2018-12-19 ENCOUNTER — Other Ambulatory Visit: Payer: Self-pay | Admitting: Family Medicine

## 2018-12-19 NOTE — Telephone Encounter (Signed)
Pt left v/m that [facility] was going to request refill on cyclobenzaprine for stomach and lt back pain, pt has had [place] and endoscopy and seen GI doctor and pt said does not know what is causing the stomach pain but taking cyclobenzaprine once daily and that helps the pain. Pt last seen 08/15/18 for abd pain.Please advise.

## 2018-12-19 NOTE — Telephone Encounter (Signed)
Please advise last OV 05/29/2018 last refill 2/14 #[phone] refills

## 2018-12-22 ENCOUNTER — Other Ambulatory Visit: Payer: Self-pay | Admitting: Family Medicine

## 2018-12-22 MED ORDER — CYCLOBENZAPRINE HCL 5 MG PO TABS: 5.0000 mg | ORAL_TABLET | Freq: Two times a day (BID) | ORAL | 0 refills | Status: DC | PRN

## 2018-12-22 NOTE — Telephone Encounter (Signed)
Patient notified as instructed by telephone and verbalized understanding. 

## 2018-12-22 NOTE — Telephone Encounter (Signed)
Patient left a voicemail stating that she has been trying to get a refill on her Cyclobenzaprine and was advised that it was refused. Patient stated that this seems to help the pain that she has and wants to know why she can not get a refill on it?

## 2018-12-22 NOTE — Telephone Encounter (Signed)
I am not sure what the issue was. May have been a mistake. Med refilled.

## 2018-12-22 NOTE — Progress Notes (Signed)
refill 

## 2018-12-26 NOTE — Progress Notes (Signed)
Consult Note: Gyn-Onc   [name] 68 y.o. female  Chief Complaint  Patient presents with  . Follow-up    Assessment : 68 year old with history of vulvar Paget's disease who is status post resection of the left vulva.  Today she does have a slightly erythematous area on the right vulva that does not have the typical appearance.  At this point I do not believe that we need to proceed with an excisional biopsy.  She will return to see me on September 1 2 ensure resolution or persistence.  The patient did endorse some scratching so it is difficult to tell if this is related to that or not.  HPI: Patient is a 68 year old with history of vulvar Paget's disease.  She is a patient of [name] and he most recently saw her in December 2019.  The patient initially underwent surgery for Paget's disease of the vulva in October 2011. There was focal involvement of the medial margin. The entire lesion is approximately 5 cm in diameter.   The patient previously had a supracervical hysterectomy but has her ovaries and cervix in situ.  Patient underwent wide local excision of a recurrent lesion on August 50,016 while all gross lesion was removed she did have positive surgical margins that were focal. Subsequently she underwent a another excision on 04/10/2015 with negative margins.  Vulva, excision, right anterior - EXTRAMAMMARY PAGET DISEASE. - RESECTION MARGINS ARE NEGATIVE. - NO INVASIVE CARCINOMA IDENTIFIED.  She did have a vulvar biopsy November 2017 that was negative.  This was on her right side.  She called stating that there is a new vulvar lesion and needed to be seen today.  She states that she looked at her vulva when she called to schedule the appointment.  She not really sure what prompted her to look but she saw a whitish area on the labia minora on her left side.  In retrospect she has had a little bit of itching.  There is been no burning, bleeding, or discharge.  There is no  symptoms on her right side.  I saw her in clinic in April and there was a visible lesion that was biopsied and consistent with Paget's disease.  ENCOUNTER DATE: 11/07/18 Surgery: Partial left vulvectomy Operative findings: 1 cm white hyperkeratotic lesion on left labia minora close to clitoris. No other lesions were appreciated with close inspection of the vulva before and after the application of acetic acid, post operative changes throughout the vulva.  Diagnosis Vulva, biopsy, left - EXTRAMAMMARY PAGET DISEASE - SEE COMMENT Microscopic Comment Paget's extends to the tips of the resection specimen. Dr. [name] reviewed the case and is in agreement with this diagnosis and assessment.     DATE: 11/07/18  Surgery: Partial left vulvectomy Operative findings: 1 cm white hyperkeratotic lesion on left labia minora close to clitoris. No other lesions were appreciated with close inspection of the vulva before and after the application of acetic acid, post operative changes throughout the vulva.  Vulva, biopsy, left - EXTRAMAMMARY PAGET DISEASE  She comes in today for her post op check.  She does endorse some pins-and-needles feeling at the site no particular itching.   Allergies  Allergen Reactions  . Ibuprofen Hypertension  . Naprosyn [Naproxen] Nausea Only  . Other     Allergic to toothpaste- makes mouth peel Hay fever/dust mites/trees "365 allergic"   . Sevoflurane Nausea And Vomiting  . Azithromycin Itching and Rash  . Sulfa Antibiotics Hives and Rash  Past Medical History:  Diagnosis Date  . Allergic rhinitis   . Arthritis   . Asthma    mild no inaler use  . GERD (gastroesophageal reflux disease)   . Headache(784.0)    Migraines  . History of adenomatous polyp of colon   . History of concussion    AS CHILD--  NO RESIDUAL  . History of palpitations   . Hypertension   . Paget's disease of vulva   . PONV (postoperative nausea and vomiting)    SEVERE  . Wears  glasses     Past Surgical History:  Procedure Laterality Date  . mucoid cyst removal thumb    . 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 PULLEY RELEASE RIGHT THUMB  03-23-2011  . SIMPLE VULVECTOMY  03-24-2010   right side  . TONSILLECTOMY  1977  . TRIGGER FINGER RELEASE    . VAGINAL HYSTERECTOMY  1989   and Anterior and posterior repair's for prolapse partial  . VULVECTOMY Right 01/10/2015   Procedure: RIGHT WIDE LOCAL EXCISION VULVECTOMY;  Surgeon: [name], MD;  Location: [place];  Service: Gynecology;  Laterality: Right;  Marland Kitchen VULVECTOMY Right 04/10/2015   Procedure: WIDE LOCAL EXCISION VULVA;  Surgeon: [name], MD;  Location[facility];  Service: Gynecology;  Laterality: Right;  Marland Kitchen VULVECTOMY PARTIAL N/A 11/07/2018   Procedure: VULVECTOMY PARTIAL;  Surgeon: [name], MD;  Location[facility];  Service: Gynecology;  Laterality: N/A;  . WISDOM TOOTH EXTRACTION      Current Outpatient Medications  Medication Sig Dispense Refill  . albuterol (PROVENTIL HFA;VENTOLIN HFA) 108 (90 Base) MCG/ACT inhaler Inhale 2 puffs into the lungs every 6 (six) hours as needed. Reported on 10/24/2015 (Patient not taking: Reported on 12/13/2018) 1 Inhaler 0  . clobetasol cream (TEMOVATE) 2.50 % Apply 1 application topically 2 (two) times daily. To the vulva 30 g 0  . cyclobenzaprine (FLEXERIL) 5 MG tablet Take 1-2 tablets (5-10 mg total) by mouth 2 (two) times daily as needed for muscle spasms. 40 tablet 0  . famotidine (PEPCID) 40 MG tablet TAKE 1/2 TABLET BY MOUTH TWICE A DAY 90 tablet 1  . lansoprazole (PREVACID) 15 MG capsule Take 1 capsule (15 mg total) by mouth 2 (two) times daily before a meal.    . levocetirizine (XYZAL) 5 MG tablet Take 5 mg by mouth every evening.    Marland Kitchen losartan-hydrochlorothiazide (HYZAAR) 100-25 MG tablet TK 1 T PO QD    .  montelukast (SINGULAIR) 10 MG tablet TAKE 1 TABLET BY MOUTH EVERY MORNING 90 tablet 0  . oxyCODONE-acetaminophen (PERCOCET/ROXICET) 5-325 MG tablet Take 1 tablet by mouth every 6 (six) hours as needed for severe pain. (Patient not taking: Reported on 12/13/2018) 10 tablet 0  . Triamcinolone & Emollient (DERMASORB TA) 0.1 % KIT   0  . triamcinolone ointment (KENALOG) 0.5 % Apply 1 application topically 2 (two) times daily. 30 g 1   No current facility-administered medications for this visit.     Social History   Socioeconomic History  . Marital status: Married    Spouse name: Not on file  . Number of children: Not on file  . Years of education: Not on file  . Highest education level: Not on file  Occupational History  . 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 Financial resource strain: Not on file  .  Food insecurity    Worry: Not on file    Inability: Not on file  . Transportation needs    Medical: Not on file    Non-medical: Not on file  Tobacco Use  . Smoking status: Former Smoker    Years: 5.00    Types: Cigarettes    Quit date: 04/15/1989    Years since quitting: 29.7  . Smokeless tobacco: Never Used  Substance and Sexual Activity  . Alcohol use: Yes    Alcohol/week: 1.0 standard drinks    Types: 1 Glasses of wine per week    Comment: occasional wine  . Drug use: No  . Sexual activity: Not on file  Lifestyle  . Physical activity    Days per week: Not on file    Minutes per session: Not on file  . Stress: Not on file  Relationships  . Social Herbalist on phone: Not on file    Gets together: Not on file    Attends religious service: Not on file    Active member of club or organization: Not on file    Attends meetings of clubs or organizations: Not on file    Relationship status: Not on file  . Intimate partner violence    Fear of current or ex partner: Not on file    Emotionally abused: Not on file    Physically abused: Not on  file    Forced sexual activity: Not on file  Other Topics Concern  . Not on file  Social History Narrative   She is married.  She is a Forensic psychologist at [employer]   Occasional wine, former smoker not now no substances    Family History  Problem Relation Age of Onset  . Cancer Maternal Grandmother   . Hypertension Father   . Heart disease Father   . Asthma Father   . Hypertension Mother   . Heart disease Mother   . Breast cancer Paternal Grandmother 64  . Colon cancer Paternal Grandmother   . Colon cancer Paternal Uncle   . Allergic rhinitis Neg Hx   . Angioedema Neg Hx   . Eczema Neg Hx   . Immunodeficiency Neg Hx   . Urticaria Neg Hx   . Esophageal cancer Neg Hx   . Rectal cancer Neg Hx   . Stomach cancer Neg Hx      Vitals: . Please refer to epic  Physical Exam: Well-nourished well-developed female in no acute distress.  Pelvic: External genitalia notable for a well-healing surgical site on the left labia minora.  There is some distortion secondary to portion of the labia minora being excised.  On her right vulva more on the external labia majora there is a 1.5 x 0.5 cm area of some erythema.  Patient endorses some recent scratching though none today.  It does not have the obvious appearance of Paget's.  GEHRIG,PAOLA A., MD 01/02/2019, 8:50 AM

## 2018-12-27 ENCOUNTER — Ambulatory Visit: Payer: Medicare Other | Admitting: Gynecologic Oncology

## 2019-01-02 ENCOUNTER — Other Ambulatory Visit: Payer: Self-pay

## 2019-01-02 ENCOUNTER — Inpatient Hospital Stay: Payer: Medicare Other | Admitting: Gynecologic Oncology

## 2019-01-02 ENCOUNTER — Ambulatory Visit: Payer: Medicare Other | Admitting: Gynecologic Oncology

## 2019-01-02 VITALS — BP 136/76 | HR 65 | Temp 98.5°F | Resp 18 | Ht 64.0 in | Wt 188.1 lb

## 2019-01-02 DIAGNOSIS — C4499 Other specified malignant neoplasm of skin, unspecified: Secondary | ICD-10-CM

## 2019-01-02 DIAGNOSIS — Z9079 Acquired absence of other genital organ(s): Secondary | ICD-10-CM

## 2019-01-02 DIAGNOSIS — C519 Malignant neoplasm of vulva, unspecified: Secondary | ICD-10-CM

## 2019-01-02 NOTE — Patient Instructions (Signed)
Please return to see me on September 1.  Do not hesitate to contact me if you have any questions or concerns prior to your visit.

## 2019-01-13 ENCOUNTER — Other Ambulatory Visit: Payer: Self-pay | Admitting: Family Medicine

## 2019-01-15 NOTE — Telephone Encounter (Signed)
Last office visit 08/15/2018 for LUQ Abd Pain.  Last refilled 12/22/2018 for #40 with no refills.  No future appointments with PCP.

## 2019-02-04 NOTE — Progress Notes (Signed)
Consult Note: Gyn-Onc   [name] 68 y.o. female  Chief Complaint  Patient presents with  . Follow-up    Assessment : 68 year old with history of vulvar Paget's disease who is status post resection of the left vulva.  She had an erythematous patch on the right that now looks pale white.  A biopsy was performed today.  I will call her with the results and will disposition her pending them.  We will recheck her blood pressure before she leaves today.   HPI: Patient is a 68 year old with history of vulvar Paget's disease.  She is a patient of [name] and he most recently saw her in December 2019.  The patient initially underwent surgery for Paget's disease of the vulva in October 2011. There was focal involvement of the medial margin. The entire lesion is approximately 5 cm in diameter.   The patient previously had a supracervical hysterectomy but has her ovaries and cervix in situ.  Patient underwent wide local excision of a recurrent lesion on August 50,016 while all gross lesion was removed she did have positive surgical margins that were focal. Subsequently she underwent a another excision on 04/10/2015 with negative margins.  Vulva, excision, right anterior - EXTRAMAMMARY PAGET DISEASE. - RESECTION MARGINS ARE NEGATIVE. - NO INVASIVE CARCINOMA IDENTIFIED.  She did have a vulvar biopsy November 2017 that was negative.  This was on her right side.  She called stating that there is a new vulvar lesion and needed to be seen today.  She states that she looked at her vulva when she called to schedule the appointment.  She not really sure what prompted her to look but she saw a whitish area on the labia minora on her left side.  In retrospect she has had a little bit of itching.  There is been no burning, bleeding, or discharge.  There is no symptoms on her right side.  I saw her in clinic in April and there was a visible lesion that was biopsied and consistent with Paget's  disease.  ENCOUNTER DATE: 11/07/18 Surgery: Partial left vulvectomy Operative findings: 1 cm white hyperkeratotic lesion on left labia minora close to clitoris. No other lesions were appreciated with close inspection of the vulva before and after the application of acetic acid, post operative changes throughout the vulva.  Diagnosis Vulva, biopsy, left - EXTRAMAMMARY PAGET DISEASE - SEE COMMENT Microscopic Comment Paget's extends to the tips of the resection specimen. Dr. [name] reviewed the case and is in agreement with this diagnosis and assessment.  DATE: 11/07/18  Surgery: Partial left vulvectomy Operative findings: 1 cm white hyperkeratotic lesion on left labia minora close to clitoris. No other lesions were appreciated with close inspection of the vulva before and after the application of acetic acid, post operative changes throughout the vulva.  Interval History: I saw her at the end of July for her post-op visit. There was an area on the right that was a bit concerning. She comes in today for follow up.  She does state that she has some itching on the right side.  She is otherwise without complaints denies any bleeding.  Her family is doing well.  Her son is a paramedic and of course she worries with the [place] pandemic but is so far everybody has been healthy.   Allergies  Allergen Reactions  . Ibuprofen Hypertension  . Naprosyn [Naproxen] Nausea Only  . Other     Allergic to toothpaste- makes mouth peel Hay fever/dust mites/trees "  365 allergic"   . Sevoflurane Nausea And Vomiting  . Azithromycin Itching and Rash  . Sulfa Antibiotics Hives and Rash    Past Medical History:  Diagnosis Date  . Allergic rhinitis   . Arthritis   . Asthma    mild no inaler use  . GERD (gastroesophageal reflux disease)   . Headache(784.0)    Migraines  . History of adenomatous polyp of colon   . History of concussion    AS CHILD--  NO RESIDUAL  . History of palpitations   .  Hypertension   . Paget's disease of vulva   . PONV (postoperative nausea and vomiting)    SEVERE  . Wears glasses     Past Surgical History:  Procedure Laterality Date  . mucoid cyst removal thumb    . 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 PULLEY RELEASE RIGHT THUMB  03-23-2011  . SIMPLE VULVECTOMY  03-24-2010   right side  . TONSILLECTOMY  1977  . TRIGGER FINGER RELEASE    . VAGINAL HYSTERECTOMY  1989   and Anterior and posterior repair's for prolapse partial  . VULVECTOMY Right 01/10/2015   Procedure: RIGHT WIDE LOCAL EXCISION VULVECTOMY;  Surgeon: [name], MD;  Location: [facility];  Service: Gynecology;  Laterality: Right;  Marland Kitchen VULVECTOMY Right 04/10/2015   Procedure: WIDE LOCAL EXCISION VULVA;  Surgeon: [name], MD;  Location: [facility];  Service: Gynecology;  Laterality: Right;  Marland Kitchen VULVECTOMY PARTIAL N/A 11/07/2018   Procedure: VULVECTOMY PARTIAL;  Surgeon: [name], MD;  Location: [facility];  Service: Gynecology;  Laterality: N/A;  . WISDOM TOOTH EXTRACTION      Current Outpatient Medications  Medication Sig Dispense Refill  . albuterol (PROVENTIL HFA;VENTOLIN HFA) 108 (90 Base) MCG/ACT inhaler Inhale 2 puffs into the lungs every 6 (six) hours as needed. Reported on 10/24/2015 (Patient not taking: Reported on 12/13/2018) 1 Inhaler 0  . clobetasol cream (TEMOVATE) 8.41 % Apply 1 application topically 2 (two) times daily. To the vulva 30 g 0  . cyclobenzaprine (FLEXERIL) 5 MG tablet TAKE 1 TO 2 TABLETS(5 TO 10 MG) BY MOUTH TWICE DAILY AS NEEDED FOR MUSCLE SPASMS 40 tablet 0  . famotidine (PEPCID) 40 MG tablet TAKE 1/2 TABLET BY MOUTH TWICE A DAY 90 tablet 1  . lansoprazole (PREVACID) 15 MG capsule Take 1 capsule (15 mg total) by mouth 2 (two) times daily before a meal.    . levocetirizine (XYZAL) 5 MG tablet Take 5 mg by  mouth every evening.    Marland Kitchen losartan-hydrochlorothiazide (HYZAAR) 100-25 MG tablet TK 1 T PO QD    . montelukast (SINGULAIR) 10 MG tablet TAKE 1 TABLET BY MOUTH EVERY MORNING 90 tablet 0  . Triamcinolone & Emollient (DERMASORB TA) 0.1 % KIT   0  . triamcinolone ointment (KENALOG) 0.5 % Apply 1 application topically 2 (two) times daily. 30 g 1   No current facility-administered medications for this visit.     Social History   Socioeconomic History  . Marital status: Married    Spouse name: Not on file  . Number of children: Not on file  . Years of education: Not on file  . Highest education level: Not on file  Occupational History  . 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 Financial resource strain: Not on file  . Food insecurity  Worry: Not on file    Inability: Not on file  . Transportation needs    Medical: Not on file    Non-medical: Not on file  Tobacco Use  . Smoking status: Former Smoker    Years: 5.00    Types: Cigarettes    Quit date: 04/15/1989    Years since quitting: 29.8  . Smokeless tobacco: Never Used  Substance and Sexual Activity  . Alcohol use: Yes    Alcohol/week: 1.0 standard drinks    Types: 1 Glasses of wine per week    Comment: occasional wine  . Drug use: No  . Sexual activity: Not on file  Lifestyle  . Physical activity    Days per week: Not on file    Minutes per session: Not on file  . Stress: Not on file  Relationships  . Social Herbalist on phone: Not on file    Gets together: Not on file    Attends religious service: Not on file    Active member of club or organization: Not on file    Attends meetings of clubs or organizations: Not on file    Relationship status: Not on file  . Intimate partner violence    Fear of current or ex partner: Not on file    Emotionally abused: Not on file    Physically abused: Not on file    Forced sexual activity: Not on file  Other Topics Concern  . Not on file   Social History Narrative   She is married.  She is a Forensic psychologist at [employer]   Occasional wine, former smoker not now no substances    Family History  Problem Relation Age of Onset  . Cancer Maternal Grandmother   . Hypertension Father   . Heart disease Father   . Asthma Father   . Hypertension Mother   . Heart disease Mother   . Breast cancer Paternal Grandmother 24  . Colon cancer Paternal Grandmother   . Colon cancer Paternal Uncle   . Allergic rhinitis Neg Hx   . Angioedema Neg Hx   . Eczema Neg Hx   . Immunodeficiency Neg Hx   . Urticaria Neg Hx   . Esophageal cancer Neg Hx   . Rectal cancer Neg Hx   . Stomach cancer Neg Hx      Vitals: . Please refer to epic  Physical Exam: Well-nourished well-developed female in no acute distress.  Pelvic: External genitalia notable for a slightly white patch on the right vulva where previously there had been an erythematous patch.  There is no other visible lesions.  The area measures approximately 1.5 x 1 cm.  After obtaining the patient's verbal consent the area was cleaned with Betadine x3.  0.7 mL's of 1% plain lidocaine was injected.  Using a Tischler biopsy forcep a biopsy of the right vulva was obtained.  Hemostasis was obtained with silver nitrate.  She tolerated the procedure well.  Lakia Gritton A., MD 02/06/2019, 9:00 AM

## 2019-02-06 ENCOUNTER — Other Ambulatory Visit: Payer: Self-pay

## 2019-02-06 ENCOUNTER — Inpatient Hospital Stay: Payer: Medicare Other | Attending: Gynecologic Oncology | Admitting: Gynecologic Oncology

## 2019-02-06 VITALS — BP 153/89 | HR 65 | Temp 98.2°F | Resp 18 | Ht 64.0 in | Wt 189.4 lb

## 2019-02-06 DIAGNOSIS — J45909 Unspecified asthma, uncomplicated: Secondary | ICD-10-CM | POA: Insufficient documentation

## 2019-02-06 DIAGNOSIS — R002 Palpitations: Secondary | ICD-10-CM | POA: Diagnosis not present

## 2019-02-06 DIAGNOSIS — Z87891 Personal history of nicotine dependence: Secondary | ICD-10-CM | POA: Diagnosis not present

## 2019-02-06 DIAGNOSIS — Z90711 Acquired absence of uterus with remaining cervical stump: Secondary | ICD-10-CM

## 2019-02-06 DIAGNOSIS — K219 Gastro-esophageal reflux disease without esophagitis: Secondary | ICD-10-CM | POA: Insufficient documentation

## 2019-02-06 DIAGNOSIS — I10 Essential (primary) hypertension: Secondary | ICD-10-CM | POA: Insufficient documentation

## 2019-02-06 DIAGNOSIS — C4499 Other specified malignant neoplasm of skin, unspecified: Secondary | ICD-10-CM

## 2019-02-06 DIAGNOSIS — Z79899 Other long term (current) drug therapy: Secondary | ICD-10-CM | POA: Diagnosis not present

## 2019-02-06 DIAGNOSIS — M1990 Unspecified osteoarthritis, unspecified site: Secondary | ICD-10-CM | POA: Diagnosis not present

## 2019-02-06 DIAGNOSIS — C519 Malignant neoplasm of vulva, unspecified: Secondary | ICD-10-CM | POA: Diagnosis not present

## 2019-02-06 DIAGNOSIS — Z9079 Acquired absence of other genital organ(s): Secondary | ICD-10-CM

## 2019-02-06 NOTE — Patient Instructions (Signed)
I will call you with the results from today.  We will then determine a final plan pending the results.

## 2019-02-13 ENCOUNTER — Telehealth: Payer: Self-pay | Admitting: Gynecologic Oncology

## 2019-02-13 NOTE — Telephone Encounter (Signed)
TC to patient to discuss pathology results. LM as no answer. Asked her to call me tomorrow when I am in [place]. PG

## 2019-02-14 ENCOUNTER — Telehealth: Payer: Self-pay | Admitting: Gynecologic Oncology

## 2019-02-14 NOTE — Telephone Encounter (Signed)
The patient and I discussed her biopsy results today.  She was offered wide local excision versus treatment with imiquimod.  We discussed the imiquimod in some detail.  She wishes to discuss with her husband and will call us back and speak to [facility] regarding how she would like to proceed.  Her questions were elicited and answered to her satisfaction. PG

## 2019-02-15 ENCOUNTER — Telehealth: Payer: Self-pay | Admitting: *Deleted

## 2019-02-15 NOTE — Telephone Encounter (Signed)
Patient called stated that she would like to proceed with surgery. Message forwarded to [facility] APP and Dr. Alycia Rossetti

## 2019-02-20 ENCOUNTER — Telehealth: Payer: Self-pay | Admitting: *Deleted

## 2019-02-20 NOTE — Telephone Encounter (Signed)
Called the patient and explained that [name] was in the progress of finding her an OR date. Patient requested a call to further discuss the surgery verus imiquimod. Message sent to both [facility] APP and Dr. Alycia Rossetti

## 2019-02-21 ENCOUNTER — Telehealth: Payer: Self-pay | Admitting: Gynecologic Oncology

## 2019-02-21 ENCOUNTER — Other Ambulatory Visit: Payer: Self-pay | Admitting: Gynecologic Oncology

## 2019-02-21 DIAGNOSIS — C519 Malignant neoplasm of vulva, unspecified: Secondary | ICD-10-CM

## 2019-02-21 DIAGNOSIS — C4499 Other specified malignant neoplasm of skin, unspecified: Secondary | ICD-10-CM

## 2019-02-21 MED ORDER — IMIQUIMOD 5 % EX CREA: TOPICAL_CREAM | CUTANEOUS | 1 refills | Status: DC

## 2019-02-21 NOTE — Progress Notes (Signed)
Aldara sent in per pt request.  Patient would like to try this prior to surgery.  Instructions per Dr. Alycia Rossetti:  She would apply to the area on the right vulva 3 times a week (M, W, F or T, Th, Sat). She has a small area so she can apply it at night, close the satchel with a paper clip, wash her hands and wash it off in the morning. She would use it for 12 weeks.

## 2019-02-21 NOTE — Telephone Encounter (Signed)
Returned call to patient.  Discussed the use of imiquimod cream and potential side effects.  Patient wanting to try use of cream first before surgery.  Advised her that we would send in the medication and notify Dr. Alycia Rossetti. Advised to call for any needs.

## 2019-02-22 ENCOUNTER — Telehealth: Payer: Self-pay

## 2019-02-22 NOTE — Telephone Encounter (Signed)
error 

## 2019-02-22 NOTE — Telephone Encounter (Signed)
Told [name] that Dr. Alycia Rossetti said to apply the Aldara cream to the right vulvar lesion 3 times a week eg.  M-W-F at night.  Wash her hands and wash off the cream in the morning. She needs to do this for 12 weeks then follow up with Dr. Alycia Rossetti on 05-09-19 at 11 am. Prescription sent to her pharmacy. Since the area is small, she may be able to get 2 applications from 1 packet.  She can fold the packet and put a paper clip on it to store. Pt verbalized understanding. Told [name] that Dr. Alycia Rossetti said that it is important to be up to date on colonoscopy and mammograms as this is the major areas where the paget's disease can occur. Pt verbalized understanding.

## 2019-02-23 ENCOUNTER — Telehealth: Payer: Self-pay | Admitting: *Deleted

## 2019-02-23 MED ORDER — MONTELUKAST SODIUM 10 MG PO TABS: 10.0000 mg | ORAL_TABLET | Freq: Every morning | ORAL | 0 refills | Status: DC

## 2019-02-23 NOTE — Telephone Encounter (Signed)
Please schedule Medicare Wellness with Nurse and CPE with Dr. Bedsole. 

## 2019-02-28 ENCOUNTER — Telehealth: Payer: Self-pay

## 2019-02-28 NOTE — Telephone Encounter (Signed)
Told Ms Barszcz that the Aldara cream was authorized. Pharmacist Audelia Acton at [facility] is processing  the prescription. The cost to her will be $ 64.00. Pt verbalized understanding.

## 2019-03-05 NOTE — Telephone Encounter (Signed)
Medicare wellness 10/13 Labs 10/14 cpx 10/20 Pt aware

## 2019-03-06 ENCOUNTER — Telehealth: Payer: Self-pay

## 2019-03-06 NOTE — Telephone Encounter (Signed)
I spoke with Ms Ashely this am.  She has used the Aldara this past friday Friday and Monday.  She stated that she felt a little irritation at the application site but this was self limiting. I encouraged her to call with any questions or concerns.  She verbalized understanding.

## 2019-03-07 ENCOUNTER — Telehealth: Payer: Self-pay

## 2019-03-07 NOTE — Telephone Encounter (Signed)
[name] states that the area being treated with the Aldara cream is  getting itchy. She wanted to know if she could apply the clobetasol 0.05% cream in between Aldara applications for the itching. Reviewed with  [name], NP.   Told Maria Boesen that [name] said that clobetasol cream could be counterproductive with the Aldara. She can use benadryl 25-50 mg every 4 hrs prn itching. Maria Vanwingerden verbalized understanding.

## 2019-03-16 ENCOUNTER — Telehealth: Payer: Self-pay | Admitting: Family Medicine

## 2019-03-16 DIAGNOSIS — I10 Essential (primary) hypertension: Secondary | ICD-10-CM

## 2019-03-16 DIAGNOSIS — D649 Anemia, unspecified: Secondary | ICD-10-CM

## 2019-03-16 NOTE — Telephone Encounter (Signed)
-----   Message from Ellamae Sia sent at 03/12/2019  2:37 PM EDT ----- Regarding: Lab orders for Wednesday, 10.14.20 Patient is scheduled for CPX labs, please order future labs, Thanks , Karna Christmas

## 2019-03-20 ENCOUNTER — Ambulatory Visit (INDEPENDENT_AMBULATORY_CARE_PROVIDER_SITE_OTHER): Payer: Medicare Other

## 2019-03-20 DIAGNOSIS — Z0000 Encounter for general adult medical examination without abnormal findings: Secondary | ICD-10-CM

## 2019-03-20 NOTE — Patient Instructions (Signed)
[name] , Thank you for taking time to come for your Medicare Wellness Visit. I appreciate your ongoing commitment to your health goals. Please review the following plan we discussed and let me know if I can assist you in the future.   Screening recommendations/referrals: Colonoscopy: up to date, completed 07/31/2015 Mammogram: Patient wants office to schedule this for her.  Bone Density: Patient wants office to schedule this for her.  Recommended yearly ophthalmology/optometry visit for glaucoma screening and checkup Recommended yearly dental visit for hygiene and checkup  Vaccinations: Influenza vaccine: Patient will get at next office visit Pneumococcal vaccine: Patient will get at next office visit Tdap vaccine: up to date, completed 05/02/2013 Shingles vaccine: declined    Advanced directives: Advance directive discussed with you today. I have provided a copy for you to complete at home and have notarized. Once this is complete please bring a copy in to our office so we can scan it into your chart.  Conditions/risks identified: hypertension  Next appointment: 03/27/2019 @ 11:20 am    Preventive Care 65 Years and Older, Female Preventive care refers to lifestyle choices and visits with your health care provider that can promote health and wellness. What does preventive care include?  A yearly physical exam. This is also called an annual well check.  Dental exams once or twice a year.  Routine eye exams. Ask your health care provider how often you should have your eyes checked.  Personal lifestyle choices, including:  Daily care of your teeth and gums.  Regular physical activity.  Eating a healthy diet.  Avoiding tobacco and drug use.  Limiting alcohol use.  Practicing safe sex.  Taking low-dose aspirin every day.  Taking vitamin and mineral supplements as recommended by your health care provider. What happens during an annual well check? The services and  screenings done by your health care provider during your annual well check will depend on your age, overall health, lifestyle risk factors, and family history of disease. Counseling  Your health care provider may ask you questions about your:  Alcohol use.  Tobacco use.  Drug use.  Emotional well-being.  Home and relationship well-being.  Sexual activity.  Eating habits.  History of falls.  Memory and ability to understand (cognition).  Work and work Statistician.  Reproductive health. Screening  You may have the following tests or measurements:  Height, weight, and BMI.  Blood pressure.  Lipid and cholesterol levels. These may be checked every 5 years, or more frequently if you are over 49 years old.  Skin check.  Lung cancer screening. You may have this screening every year starting at age 47 if you have a 30-pack-year history of smoking and currently smoke or have quit within the past 15 years.  Fecal occult blood test (FOBT) of the stool. You may have this test every year starting at age 59.  Flexible sigmoidoscopy or colonoscopy. You may have a sigmoidoscopy every 5 years or a colonoscopy every 10 years starting at age 57.  Hepatitis C blood test.  Hepatitis B blood test.  Sexually transmitted disease (STD) testing.  Diabetes screening. This is done by checking your blood sugar (glucose) after you have not eaten for a while (fasting). You may have this done every 1-3 years.  Bone density scan. This is done to screen for osteoporosis. You may have this done starting at age 29.  Mammogram. This may be done every 1-2 years. Talk to your health care provider about how often you  should have regular mammograms. Talk with your health care provider about your test results, treatment options, and if necessary, the need for more tests. Vaccines  Your health care provider may recommend certain vaccines, such as:  Influenza vaccine. This is recommended every year.   Tetanus, diphtheria, and acellular pertussis (Tdap, Td) vaccine. You may need a Td booster every 10 years.  Zoster vaccine. You may need this after age 21.  Pneumococcal 13-valent conjugate (PCV13) vaccine. One dose is recommended after age 27.  Pneumococcal polysaccharide (PPSV23) vaccine. One dose is recommended after age 82. Talk to your health care provider about which screenings and vaccines you need and how often you need them. This information is not intended to replace advice given to you by your health care provider. Make sure you discuss any questions you have with your health care provider. Document Released: 06/20/2015 Document Revised: 02/11/2016 Document Reviewed: 03/25/2015 Elsevier Interactive Patient Education  2017 [employer] Prevention in the Home Falls can cause injuries. They can happen to people of all ages. There are many things you can do to make your home safe and to help prevent falls. What can I do on the outside of my home?  Regularly fix the edges of walkways and driveways and fix any cracks.  Remove anything that might make you trip as you walk through a door, such as a raised step or threshold.  Trim any bushes or trees on the path to your home.  Use bright outdoor lighting.  Clear any walking paths of anything that might make someone trip, such as rocks or tools.  Regularly check to see if handrails are loose or broken. Make sure that both sides of any steps have handrails.  Any raised decks and porches should have guardrails on the edges.  Have any leaves, snow, or ice cleared regularly.  Use sand or salt on walking paths during winter.  Clean up any spills in your garage right away. This includes oil or grease spills. What can I do in the bathroom?  Use night lights.  Install grab bars by the toilet and in the tub and shower. Do not use towel bars as grab bars.  Use non-skid mats or decals in the tub or shower.  If you need to sit  down in the shower, use a plastic, non-slip stool.  Keep the floor dry. Clean up any water that spills on the floor as soon as it happens.  Remove soap buildup in the tub or shower regularly.  Attach bath mats securely with double-sided non-slip rug tape.  Do not have throw rugs and other things on the floor that can make you trip. What can I do in the bedroom?  Use night lights.  Make sure that you have a light by your bed that is easy to reach.  Do not use any sheets or blankets that are too big for your bed. They should not hang down onto the floor.  Have a firm chair that has side arms. You can use this for support while you get dressed.  Do not have throw rugs and other things on the floor that can make you trip. What can I do in the kitchen?  Clean up any spills right away.  Avoid walking on wet floors.  Keep items that you use a lot in easy-to-reach places.  If you need to reach something above you, use a strong step stool that has a grab bar.  Keep electrical cords out  of the way.  Do not use floor polish or wax that makes floors slippery. If you must use wax, use non-skid floor wax.  Do not have throw rugs and other things on the floor that can make you trip. What can I do with my stairs?  Do not leave any items on the stairs.  Make sure that there are handrails on both sides of the stairs and use them. Fix handrails that are broken or loose. Make sure that handrails are as long as the stairways.  Check any carpeting to make sure that it is firmly attached to the stairs. Fix any carpet that is loose or worn.  Avoid having throw rugs at the top or bottom of the stairs. If you do have throw rugs, attach them to the floor with carpet tape.  Make sure that you have a light switch at the top of the stairs and the bottom of the stairs. If you do not have them, ask someone to add them for you. What else can I do to help prevent falls?  Wear shoes that:  Do not have  high heels.  Have rubber bottoms.  Are comfortable and fit you well.  Are closed at the toe. Do not wear sandals.  If you use a stepladder:  Make sure that it is fully opened. Do not climb a closed stepladder.  Make sure that both sides of the stepladder are locked into place.  Ask someone to hold it for you, if possible.  Clearly mark and make sure that you can see:  Any grab bars or handrails.  First and last steps.  Where the edge of each step is.  Use tools that help you move around (mobility aids) if they are needed. These include:  Canes.  Walkers.  Scooters.  Crutches.  Turn on the lights when you go into a dark area. Replace any light bulbs as soon as they burn out.  Set up your furniture so you have a clear path. Avoid moving your furniture around.  If any of your floors are uneven, fix them.  If there are any pets around you, be aware of where they are.  Review your medicines with your doctor. Some medicines can make you feel dizzy. This can increase your chance of falling. Ask your doctor what other things that you can do to help prevent falls. This information is not intended to replace advice given to you by your health care provider. Make sure you discuss any questions you have with your health care provider. Document Released: 03/20/2009 Document Revised: 10/30/2015 Document Reviewed: 06/28/2014 Elsevier Interactive Patient Education  2017 [employer].

## 2019-03-20 NOTE — Progress Notes (Signed)
PCP notes: none  Health Maintenance: Patient wants the office to schedule her mammogram and Dexa scan for her please. Patient will get flu and prevnar 13 vaccine at her physical next week.  Declined Shingrix at this time.     Abnormal Screenings: none    Patient concerns: none    Nurse concerns: none    Next PCP appt.: 03/27/2019 @ 11:20 am

## 2019-03-20 NOTE — Progress Notes (Signed)
Subjective:   [name] is a 68 y.o. female who presents for an Initial Medicare Annual Wellness Visit.  Review of Systems      This visit is being conducted through telemedicine via telephone at the nurse health advisor's home address due to the COVID-19 pandemic. This patient has given me verbal consent via doximity to conduct this visit, patient states they are participating from their home address. Some vital signs may be absent or patient reported.    Patient identification: identified by name, DOB, and current address   Cardiac Risk Factors include: advanced age (>17mn, >>39women);hypertension;sedentary lifestyle     Objective:    Today's Vitals   There is no height or weight on file to calculate BMI.  Advanced Directives 03/20/2019 12/13/2018 11/07/2018 09/13/2018 05/16/2018 11/11/2017 05/18/2017  Does Patient Have a Medical Advance Directive? _0  No No  Would patient like information on creating a medical advance directive? No - Patient declined No - Patient declined No - Guardian declined No - Patient declined No - Patient declined No - Patient declined No - Patient declined    Current Medications (verified) Outpatient Encounter Medications as of 03/20/2019  Medication Sig  . clobetasol cream (TEMOVATE) 06.14% Apply 1 application topically 2 (two) times daily. To the vulva  . cyclobenzaprine (FLEXERIL) 5 MG tablet TAKE 1 TO 2 TABLETS(5 TO 10 MG) BY MOUTH TWICE DAILY AS NEEDED FOR MUSCLE SPASMS  . diphenhydrAMINE (BENADRYL) 25 MG tablet Take 25 mg by mouth. 2 tablets twice daily  . famotidine (PEPCID) 40 MG tablet TAKE 1/2 TABLET BY MOUTH TWICE A DAY  . imiquimod (ALDARA) 5 % cream Apply topically 3 (three) times a week. Apply to right vulva 3 times a week, use for 12 weeks  . lansoprazole (PREVACID) 15 MG capsule Take 1 capsule (15 mg total) by mouth 2 (two) times daily before a meal.  . levocetirizine (XYZAL) 5 MG tablet Take 5 mg by mouth every evening.  .Marland Kitchen losartan-hydrochlorothiazide (HYZAAR) 100-25 MG tablet TK 1 T PO QD  . montelukast (SINGULAIR) 10 MG tablet Take 1 tablet (10 mg total) by mouth every morning.  . Triamcinolone & Emollient (DERMASORB TA) 0.1 % KIT   . triamcinolone ointment (KENALOG) 0.5 % Apply 1 application topically 2 (two) times daily.   No facility-administered encounter medications on file as of 03/20/2019.     Allergies (verified) Ibuprofen, Naprosyn [naproxen], Other, Sevoflurane, Azithromycin, and Sulfa antibiotics   History: Past Medical History:  Diagnosis Date  . Allergic rhinitis   . Arthritis   . Asthma    mild no inaler use  . GERD (gastroesophageal reflux disease)   . Headache(784.0)    Migraines  . History of adenomatous polyp of colon   . History of concussion    AS CHILD--  NO RESIDUAL  . History of palpitations   . Hypertension   . Paget's disease of vulva   . PONV (postoperative nausea and vomiting)    SEVERE  . Wears glasses    Past Surgical History:  Procedure Laterality Date  . mucoid cyst removal thumb    . 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 PULLEY RELEASE RIGHT THUMB  03-23-2011  . SIMPLE VULVECTOMY  03-24-2010   right side  . TONSILLECTOMY  1977  . TRIGGER FINGER RELEASE    . VAGINAL HYSTERECTOMY  1989   and Anterior  and posterior repair's for prolapse partial  . VULVECTOMY Right 01/10/2015   Procedure: RIGHT WIDE LOCAL EXCISION VULVECTOMY;  Surgeon: [name], MD;  Location: [facility];  Service: Gynecology;  Laterality: Right;  Marland Kitchen VULVECTOMY Right 04/10/2015   Procedure: WIDE LOCAL EXCISION VULVA;  Surgeon: [name], MD;  Location: [facility];  Service: Gynecology;  Laterality: Right;  Marland Kitchen VULVECTOMY PARTIAL N/A 11/07/2018   Procedure: VULVECTOMY PARTIAL;  Surgeon: [name], MD;  Location: [facility];  Service:  Gynecology;  Laterality: N/A;  . WISDOM TOOTH EXTRACTION     Family History  Problem Relation Age of Onset  . Cancer Maternal Grandmother   . Hypertension Father   . Heart disease Father   . Asthma Father   . Hypertension Mother   . Heart disease Mother   . Breast cancer Paternal Grandmother 4  . Colon cancer Paternal Grandmother   . Colon cancer Paternal Uncle   . Allergic rhinitis Neg Hx   . Angioedema Neg Hx   . Eczema Neg Hx   . Immunodeficiency Neg Hx   . Urticaria Neg Hx   . Esophageal cancer Neg Hx   . Rectal cancer Neg Hx   . Stomach cancer Neg Hx    Social History   Socioeconomic History  . Marital status: Married    Spouse name: Not on file  . Number of children: Not on file  . Years of education: Not on file  . Highest education level: Not on file  Occupational History  . 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 Financial resource strain: Not hard at all  . Food insecurity    Worry: Never true    Inability: Never true  . Transportation needs    Medical: No    Non-medical: No  Tobacco Use  . Smoking status: Former Smoker    Years: 5.00    Types: Cigarettes    Quit date: 04/15/1989    Years since quitting: 29.9  . Smokeless tobacco: Never Used  Substance and Sexual Activity  . Alcohol use: Yes    Alcohol/week: 1.0 standard drinks    Types: 1 Glasses of wine per week    Comment: occasional wine  . Drug use: No  . Sexual activity: Not on file  Lifestyle  . Physical activity    Days per week: 0 days    Minutes per session: 0 min  . Stress: Not at all  Relationships  . Social Herbalist on phone: Not on file    Gets together: Not on file    Attends religious service: Not on file    Active member of club or organization: Not on file    Attends meetings of clubs or organizations: Not on file    Relationship status: Not on file  Other Topics Concern  . Not on file  Social History Narrative   She is married.  She  is a Forensic psychologist at [employer]   Occasional wine, former smoker not now no substances    Tobacco Counseling Counseling given: Not Answered   Clinical Intake:  Pre-visit preparation completed: Yes  Pain : No/denies pain     Nutritional Risks: None Diabetes: No  How often do you need to have someone help you when you read instructions, pamphlets, or other written materials from your doctor or pharmacy?: 1 - Never What is the last grade level you completed in school?: 12th  Interpreter  Needed?: No  Information entered by :: CJohnson, LPN   Activities of Daily Living In your present state of health, do you have any difficulty performing the following activities: 03/20/2019 11/07/2018  Hearing? N N  Vision? N N  Difficulty concentrating or making decisions? N N  Walking or climbing stairs? N N  Dressing or bathing? N N  Doing errands, shopping? N -  Preparing Food and eating ? N -  Using the Toilet? N -  In the past six months, have you accidently leaked urine? N -  Do you have problems with loss of bowel control? N -  Managing your Medications? N -  Managing your Finances? N -  Housekeeping or managing your Housekeeping? N -  Some recent data might be hidden     Immunizations and Health Maintenance Immunization History  Administered Date(s) Administered  . Influenza,inj,Quad PF,6+ Mos 03/13/2015, 04/09/2016, 06/17/2017, 04/07/2018  . Td 12/06/2006  . Tdap 05/02/2013  . Zoster 04/09/2016   Health Maintenance Due  Topic Date Due  . DEXA SCAN  01/12/2018  . PNA vac Low Risk Adult (1 of 2 - PCV13) 01/12/2018  . MAMMOGRAM  08/27/2018  . INFLUENZA VACCINE  01/06/2019    Patient Care Team: Jinny Sanders, MD as PCP - General  Indicate any recent Medical Services you may have received from other than [facility] providers in the past year (date may be approximate).     Assessment:   This is a routine wellness examination for Raine.  Hearing/Vision screen  Hearing  Screening   125Hz 250Hz 500Hz 1000Hz 2000Hz 3000Hz 4000Hz 6000Hz 8000Hz  Right ear:           Left ear:           Vision Screening Comments: Patient gets annual eye exams   Dietary issues and exercise activities discussed: Current Exercise Habits: The patient does not participate in regular exercise at present, Exercise limited by: None identified  Goals    . Patient Stated     03/20/2019, I will start walking more daily and increase exercise.       Depression Screen PHQ 2/9 Scores 03/20/2019 09/06/2017  PHQ - 2 Score 0 0  PHQ- 9 Score 0 -    Fall Risk Fall Risk  03/20/2019  Falls in the past year? 0  Risk for fall due to : Medication side effect  Follow up Falls evaluation completed;Falls prevention discussed    Is the patient's home free of loose throw rugs in walkways, pet beds, electrical cords, etc?   yes      Grab bars in the bathroom? no      Handrails on the stairs?   no      Adequate lighting?   yes  Timed Get Up and Go Performed: n/a  Cognitive Function: MMSE - Mini Mental State Exam 03/20/2019  Orientation to time 5  Orientation to Place 5  Registration 3  Attention/ Calculation 5  Recall 3  Language- repeat 1  Mini Cog  Mini-Cog screen was completed. Maximum score is 22. A value of 0 denotes this part of the MMSE was not completed or the patient failed this part of the Mini-Cog screening.      Screening Tests Health Maintenance  Topic Date Due  . DEXA SCAN  01/12/2018  . PNA vac Low Risk Adult (1 of 2 - PCV13) 01/12/2018  . MAMMOGRAM  08/27/2018  . INFLUENZA VACCINE  01/06/2019  . TETANUS/TDAP  05/03/2023  .  COLONOSCOPY  07/30/2025  . Hepatitis C Screening  Completed    Qualifies for Shingles Vaccine? yes  Cancer Screenings: Lung: Low Dose CT Chest recommended if Age 75-80 years, 30 pack-year currently smoking OR have quit w/in 15years. Patient does not qualify. Breast: Up to date on Mammogram? No, Patient wants office to schedule appointment  for her   Up to date of Bone Density/Dexa? No, Patient wants office to schedule appointment for her Colorectal: completed 07/31/2015  Additional Screenings:  Hepatitis C Screening: 04/09/2016     Plan:    Patient wants to start back exercising more daily.   I have personally reviewed and noted the following in the patient's chart:   . Medical and social history . Use of alcohol, tobacco or illicit drugs  . Current medications and supplements . Functional ability and status . Nutritional status . Physical activity . Advanced directives . List of other physicians . Hospitalizations, surgeries, and ER visits in previous 12 months . Vitals . Screenings to include cognitive, depression, and falls . Referrals and appointments  In addition, I have reviewed and discussed with patient certain preventive protocols, quality metrics, and best practice recommendations. A written personalized care plan for preventive services as well as general preventive health recommendations were provided to patient.     Andrez Grime, LPN   [id number]

## 2019-03-21 ENCOUNTER — Other Ambulatory Visit: Payer: Medicare Other

## 2019-03-27 ENCOUNTER — Encounter: Payer: Medicare Other | Admitting: Family Medicine

## 2019-04-12 ENCOUNTER — Other Ambulatory Visit: Payer: Self-pay | Admitting: Family Medicine

## 2019-04-13 ENCOUNTER — Other Ambulatory Visit (INDEPENDENT_AMBULATORY_CARE_PROVIDER_SITE_OTHER): Payer: Medicare Other

## 2019-04-13 DIAGNOSIS — I10 Essential (primary) hypertension: Secondary | ICD-10-CM

## 2019-04-13 LAB — COMPREHENSIVE METABOLIC PANEL
ALT: 30 U/L (ref 0–35)
AST: 20 U/L (ref 0–37)
Albumin: 4.2 g/dL (ref 3.5–5.2)
Alkaline Phosphatase: 43 U/L (ref 39–117)
BUN: 12 mg/dL (ref 6–23)
CO2: 30 mEq/L (ref 19–32)
Calcium: 9.1 mg/dL (ref 8.4–10.5)
Chloride: 101 mEq/L (ref 96–112)
Creatinine, Ser: 0.83 mg/dL (ref 0.40–1.20)
GFR: 68.73 mL/min (ref >60.00)
Glucose, Bld: 94 mg/dL (ref 70–99)
Potassium: 3.5 mEq/L (ref 3.5–5.1)
Sodium: 140 mEq/L (ref 135–145)
Total Bilirubin: 0.5 mg/dL (ref 0.2–1.2)
Total Protein: 6.7 g/dL (ref 6.0–8.3)

## 2019-04-13 LAB — LIPID PANEL
Cholesterol: 218 mg/dL — ABNORMAL HIGH (ref 0–200)
HDL: 64.9 mg/dL (ref >39.00)
LDL Cholesterol: 129 mg/dL — ABNORMAL HIGH (ref 0–99)
NonHDL: 153.05
Total CHOL/HDL Ratio: 3
Triglycerides: 120 mg/dL (ref 0.0–149.0)
VLDL: 24 mg/dL (ref 0.0–40.0)

## 2019-04-13 NOTE — Progress Notes (Signed)
No critical labs need to be addressed urgently. We will discuss labs in detail at upcoming office visit.   

## 2019-04-17 ENCOUNTER — Ambulatory Visit (INDEPENDENT_AMBULATORY_CARE_PROVIDER_SITE_OTHER): Payer: Medicare Other | Admitting: Family Medicine

## 2019-04-17 ENCOUNTER — Encounter: Payer: Self-pay | Admitting: Family Medicine

## 2019-04-17 ENCOUNTER — Other Ambulatory Visit: Payer: Self-pay

## 2019-04-17 VITALS — BP 110/80 | HR 61 | Temp 97.5°F | Ht 64.0 in | Wt 190.5 lb

## 2019-04-17 DIAGNOSIS — Z0000 Encounter for general adult medical examination without abnormal findings: Secondary | ICD-10-CM | POA: Diagnosis not present

## 2019-04-17 DIAGNOSIS — E7800 Pure hypercholesterolemia, unspecified: Secondary | ICD-10-CM | POA: Diagnosis not present

## 2019-04-17 DIAGNOSIS — E2839 Other primary ovarian failure: Secondary | ICD-10-CM

## 2019-04-17 DIAGNOSIS — Z23 Encounter for immunization: Secondary | ICD-10-CM | POA: Diagnosis not present

## 2019-04-17 DIAGNOSIS — I10 Essential (primary) hypertension: Secondary | ICD-10-CM | POA: Diagnosis not present

## 2019-04-17 DIAGNOSIS — Z1231 Encounter for screening mammogram for malignant neoplasm of breast: Secondary | ICD-10-CM

## 2019-04-17 DIAGNOSIS — C4499 Other specified malignant neoplasm of skin, unspecified: Secondary | ICD-10-CM | POA: Diagnosis not present

## 2019-04-17 DIAGNOSIS — C519 Malignant neoplasm of vulva, unspecified: Secondary | ICD-10-CM

## 2019-04-17 MED ORDER — CYCLOBENZAPRINE HCL 5 MG PO TABS: ORAL_TABLET | ORAL | 0 refills | Status: DC

## 2019-04-17 MED ORDER — LOSARTAN POTASSIUM-HCTZ 100-25 MG PO TABS: ORAL_TABLET | ORAL | 3 refills | Status: DC

## 2019-04-17 MED ORDER — METOPROLOL TARTRATE 25 MG PO TABS: 12.5000 mg | ORAL_TABLET | Freq: Two times a day (BID) | ORAL | 3 refills | Status: DC

## 2019-04-17 NOTE — Patient Instructions (Signed)
Preventive Care 66 Years and Older, Female Preventive care refers to lifestyle choices and visits with your health care provider that can promote health and wellness. This includes:  A yearly physical exam. This is also called an annual well check.  Regular dental and eye exams.  Immunizations.  Screening for certain conditions.  Healthy lifestyle choices, such as diet and exercise. What can I expect for my preventive care visit? Physical exam Your health care provider will check:  Height and weight. These may be used to calculate body mass index (BMI), which is a measurement that tells if you are at a healthy weight.  Heart rate and blood pressure.  Your skin for abnormal spots. Counseling Your health care provider may ask you questions about:  Alcohol, tobacco, and drug use.  Emotional well-being.  Home and relationship well-being.  Sexual activity.  Eating habits.  History of falls.  Memory and ability to understand (cognition).  Work and work Statistician.  Pregnancy and menstrual history. What immunizations do I need?  Influenza (flu) vaccine  This is recommended every year. Tetanus, diphtheria, and pertussis (Tdap) vaccine  You may need a Td booster every 10 years. Varicella (chickenpox) vaccine  You may need this vaccine if you have not already been vaccinated. Zoster (shingles) vaccine  You may need this after age 33. Pneumococcal conjugate (PCV13) vaccine  One dose is recommended after age 33. Pneumococcal polysaccharide (PPSV23) vaccine  One dose is recommended after age 72. Measles, mumps, and rubella (MMR) vaccine  You may need at least one dose of MMR if you were born in 1957 or later. You may also need a second dose. Meningococcal conjugate (MenACWY) vaccine  You may need this if you have certain conditions. Hepatitis A vaccine  You may need this if you have certain conditions or if you travel or work in places where you may be exposed  to hepatitis A. Hepatitis B vaccine  You may need this if you have certain conditions or if you travel or work in places where you may be exposed to hepatitis B. Haemophilus influenzae type b (Hib) vaccine  You may need this if you have certain conditions. You may receive vaccines as individual doses or as more than one vaccine together in one shot (combination vaccines). Talk with your health care provider about the risks and benefits of combination vaccines. What tests do I need? Blood tests  Lipid and cholesterol levels. These may be checked every 5 years, or more frequently depending on your overall health.  Hepatitis C test.  Hepatitis B test. Screening  Lung cancer screening. You may have this screening every year starting at age 39 if you have a 30-pack-year history of smoking and currently smoke or have quit within the past 15 years.  Colorectal cancer screening. All adults should have this screening starting at age 36 and continuing until age 15. Your health care provider may recommend screening at age 23 if you are at increased risk. You will have tests every 1-10 years, depending on your results and the type of screening test.  Diabetes screening. This is done by checking your blood sugar (glucose) after you have not eaten for a while (fasting). You may have this done every 1-3 years.  Mammogram. This may be done every 1-2 years. Talk with your health care provider about how often you should have regular mammograms.  BRCA-related cancer screening. This may be done if you have a family history of breast, ovarian, tubal, or peritoneal cancers.  Other tests  Sexually transmitted disease (STD) testing.  Bone density scan. This is done to screen for osteoporosis. You may have this done starting at age 76. Follow these instructions at home: Eating and drinking  Eat a diet that includes fresh fruits and vegetables, whole grains, lean protein, and low-fat dairy products. Limit  your intake of foods with high amounts of sugar, saturated fats, and salt.  Take vitamin and mineral supplements as recommended by your health care provider.  Do not drink alcohol if your health care provider tells you not to drink.  If you drink alcohol: ? Limit how much you have to 0-1 drink a day. ? Be aware of how much alcohol is in your drink. In the [place] one drink equals one 12 oz bottle of beer (355 mL), one 5 oz glass of wine (148 mL), or one 1 oz glass of hard liquor (44 mL). Lifestyle  Take daily care of your teeth and gums.  Stay active. Exercise for at least 30 minutes on 5 or more days each week.  Do not use any products that contain nicotine or tobacco, such as cigarettes, e-cigarettes, and chewing tobacco. If you need help quitting, ask your health care provider.  If you are sexually active, practice safe sex. Use a condom or other form of protection in order to prevent STIs (sexually transmitted infections).  Talk with your health care provider about taking a low-dose aspirin or statin. What's next?  Go to your health care provider once a year for a well check visit.  Ask your health care provider how often you should have your eyes and teeth checked.  Stay up to date on all vaccines. This information is not intended to replace advice given to you by your health care provider. Make sure you discuss any questions you have with your health care provider. Document Released: 06/20/2015 Document Revised: 05/18/2018 Document Reviewed: 05/18/2018 Elsevier Patient Education  2020 [employer].

## 2019-04-17 NOTE — Assessment & Plan Note (Signed)
Follwed by GYN

## 2019-04-17 NOTE — Addendum Note (Signed)
Addended by: Carter Kitten on: 04/17/2019 11:51 AM   Modules accepted: Orders

## 2019-04-17 NOTE — Assessment & Plan Note (Signed)
10 year risk of 6.1% CVD.Marland Kitchen given CVD  Early in father will start statin if goes above 7%

## 2019-04-17 NOTE — Assessment & Plan Note (Signed)
Well controlled. Continue current medication.  

## 2019-04-17 NOTE — Progress Notes (Signed)
Chief Complaint  Patient presents with  . Annual Exam    Part 2    History of Present Illness: HPI  The patient presents for  complete physical and review of chronic health problems. He/She also has the following acute concerns today:  The patient saw a LPN or RN for medicare wellness visit.  Prevention and wellness was reviewed in detail. Note reviewed and important notes copied below. Health Maintenance: Patient wants the office to schedule her mammogram and Dexa scan for her please. Patient will get flu and prevnar 13 vaccine at her physical next week.  Declined Shingrix at this time.  Abnormal Screenings: none   04/17/19 Paget's disease of vulva followed by Dr. Aundria Rud GYN  Hypertension:    Good control on losartan HCTZ BP Readings from Last 3 Encounters:  04/17/19 110/80  02/06/19 (!) 153/89  01/02/19 136/76  Using medication without problems or lightheadedness:  none Chest pain with exertion:none Edema:none Short of breath:none Average home BPs: Other issues:  Elevated Cholesterol:  10 year risk of 6.1% CVD.Marland Kitchen given CVD  Early in fatherwill start statin if goes above 7% Lab Results  Component Value Date   CHOL 218 (H) 04/13/2019   HDL 64.90 04/13/2019   LDLCALC 129 (H) 04/13/2019   LDLDIRECT 122.5 07/14/2009   TRIG 120.0 04/13/2019   CHOLHDL 3 04/13/2019  Using medications without problems: Muscle aches:  Diet compliance: moderate Exercise:  walking some Other complaints:  COVID 19 screen No recent travel or known exposure to [place] The patient denies respiratory symptoms of COVID 19 at this time.  The importance of social distancing was discussed today.   Review of Systems  Constitutional: Negative for chills and fever.  HENT: Negative for congestion and ear pain.   Eyes: Negative for pain and redness.  Respiratory: Negative for cough and shortness of breath.   Cardiovascular: Negative for chest pain, palpitations and leg swelling.   Gastrointestinal: Negative for abdominal pain, blood in stool, constipation, diarrhea, nausea and vomiting.  Genitourinary: Negative for dysuria.  Musculoskeletal: Negative for falls and myalgias.  Skin: Negative for rash.  Neurological: Negative for dizziness.  Psychiatric/Behavioral: Negative for depression. The patient is not nervous/anxious.       Past Medical History:  Diagnosis Date  . Allergic rhinitis   . Arthritis   . Asthma    mild no inaler use  . GERD (gastroesophageal reflux disease)   . Headache(784.0)    Migraines  . History of adenomatous polyp of colon   . History of concussion    AS CHILD--  NO RESIDUAL  . History of palpitations   . Hypertension   . Paget's disease of vulva   . PONV (postoperative nausea and vomiting)    SEVERE  . Wears glasses     reports that she quit smoking about 30 years ago. Her smoking use included cigarettes. She quit after 5.00 years of use. She has never used smokeless tobacco. She reports current alcohol use of about 1.0 standard drinks of alcohol per week. She reports that she does not use drugs.   Current Outpatient Medications:  .  cyclobenzaprine (FLEXERIL) 5 MG tablet, TAKE 1 TO 2 TABLETS(5 TO 10 MG) BY MOUTH TWICE DAILY AS NEEDED FOR MUSCLE SPASMS, Disp: 40 tablet, Rfl: 0 .  diphenhydrAMINE (BENADRYL) 25 MG tablet, Take 25 mg by mouth. 2 tablets twice daily, Disp: , Rfl:  .  famotidine (PEPCID) 40 MG tablet, TAKE 1/2 TABLET BY MOUTH TWICE A DAY, Disp: 90 tablet,  Rfl: 1 .  imiquimod (ALDARA) 5 % cream, Apply topically 3 (three) times a week. Apply to right vulva 3 times a week, use for 12 weeks, Disp: 12 each, Rfl: 1 .  lansoprazole (PREVACID) 15 MG capsule, Take 1 capsule (15 mg total) by mouth 2 (two) times daily before a meal., Disp: , Rfl:  .  levocetirizine (XYZAL) 5 MG tablet, Take 5 mg by mouth every evening., Disp: , Rfl:  .  losartan-hydrochlorothiazide (HYZAAR) 100-25 MG tablet, TK 1 T PO QD, Disp: , Rfl:  .   metoprolol tartrate (LOPRESSOR) 25 MG tablet, Take 12.5 mg by mouth 2 (two) times daily. , Disp: , Rfl:  .  montelukast (SINGULAIR) 10 MG tablet, Take 1 tablet (10 mg total) by mouth every morning., Disp: 90 tablet, Rfl: 0 .  Triamcinolone & Emollient (DERMASORB TA) 0.1 % KIT, , Disp: , Rfl: 0 .  triamcinolone ointment (KENALOG) 0.5 %, Apply 1 application topically 2 (two) times daily., Disp: 30 g, Rfl: 1 .  clobetasol cream (TEMOVATE) 0.94 %, Apply 1 application topically 2 (two) times daily. To the vulva (Patient not taking: Reported on 04/17/2019), Disp: 30 g, Rfl: 0   Observations/Objective: Blood pressure 110/80, pulse 61, temperature (!) 97.5 F (36.4 C), temperature source Temporal, height 5' 4"  (1.626 m), weight 190 lb 8 oz (86.4 kg), SpO2 95 %.  Physical Exam Constitutional:      General: She is not in acute distress.    Appearance: Normal appearance. She is well-developed. She is not ill-appearing or toxic-appearing.  HENT:     Head: Normocephalic.     Right Ear: Hearing, tympanic membrane, ear canal and external ear normal.     Left Ear: Hearing, tympanic membrane, ear canal and external ear normal.     Nose: Nose normal.  Eyes:     General: Lids are normal. Lids are everted, no foreign bodies appreciated.     Conjunctiva/sclera: Conjunctivae normal.     Pupils: Pupils are equal, round, and reactive to light.  Neck:     Musculoskeletal: Normal range of motion and neck supple.     Thyroid: No thyroid mass or thyromegaly.     Vascular: No carotid bruit.     Trachea: Trachea normal.  Cardiovascular:     Rate and Rhythm: Normal rate and regular rhythm.     Heart sounds: Normal heart sounds, S1 normal and S2 normal. No murmur. No gallop.   Pulmonary:     Effort: Pulmonary effort is normal. No respiratory distress.     Breath sounds: Normal breath sounds. No wheezing, rhonchi or rales.  Abdominal:     General: Bowel sounds are normal. There is no distension or abdominal bruit.      Palpations: Abdomen is soft. There is no fluid wave or mass.     Tenderness: There is no abdominal tenderness. There is no guarding or rebound.     Hernia: No hernia is present.  Lymphadenopathy:     Cervical: No cervical adenopathy.  Skin:    General: Skin is warm and dry.     Findings: No rash.  Neurological:     Mental Status: She is alert.     Cranial Nerves: No cranial nerve deficit.     Sensory: No sensory deficit.  Psychiatric:        Mood and Affect: Mood is not anxious or depressed.        Speech: Speech normal.        Behavior: Behavior  normal. Behavior is cooperative.        Judgment: Judgment normal.      Assessment and Plan The patient's preventative maintenance and recommended screening tests for an annual wellness exam were reviewed in full today. Brought up to date unless services declined.  Counselled on the importance of diet, exercise, and its role in overall health and mortality. The patient's FH and SH was reviewed, including their home life, tobacco status, and drug and alcohol status.   Vaccines: Uptodate with Tdap, and shinglesgiven. Give prevnar and flu taoday PAP/DVE: Followed by GYN, partial hysterectomy.. Pap not indicated. Mammo:3/2019nml .. due Colon: nml 2017DrCarlean Purl, repeat in 10years. Hep C : done  HIV: refused  Due for DEXA    Paget's disease of vulva Follwed by GYN  Essential hypertension, benign Well controlled. Continue current medication.   Hypercholesteremia   10 year risk of 6.1% CVD.Marland Kitchen given CVD  Early in father will start statin if goes above 7%     Eliezer Lofts, MD

## 2019-04-18 ENCOUNTER — Telehealth: Payer: Self-pay

## 2019-04-18 DIAGNOSIS — Z1231 Encounter for screening mammogram for malignant neoplasm of breast: Secondary | ICD-10-CM

## 2019-04-18 NOTE — Telephone Encounter (Signed)
Pt [email or web address].  Can you please change order to [id number]?

## 2019-04-18 NOTE — Addendum Note (Signed)
Addended by: Eliezer Lofts E on: 04/18/2019 01:18 PM   Modules accepted: Orders

## 2019-05-08 NOTE — Progress Notes (Signed)
Consult Note: Gyn-Onc   [name] 69 y.o. female  No chief complaint on file.   Assessment : 68 year old with history of vulvar Paget's disease who is status post resection of the left vulva.  She had an erythematous patch on the right that change in appearance and a biopsy revealed recurrent Paget's disease.  After discussion of options including surgical excision versus Aldara cream, she opted for the Aldara cream and has had a very nice response and is very pleased that this is an option for her.  She does now appear to have another patch just superior to the clitoris on the right side that measures approximately a centimeter by centimeter.  She was shown this area with a mirror.  She will start using the Aldara in that area at this time.  She is aware of the instructions and has had no issues using it.  She will return to see [place] in 3 months.  HPI: Patient is a 68 year old with history of vulvar Paget's disease.  She is a patient of [name] and he most recently saw her in December 2019.  The patient initially underwent surgery for Paget's disease of the vulva in October 2011. There was focal involvement of the medial margin. The entire lesion is approximately 5 cm in diameter.   The patient previously had a supracervical hysterectomy but has her ovaries and cervix in situ.  Patient underwent wide local excision of a recurrent lesion on August 50,016 while all gross lesion was removed she did have positive surgical margins that were focal. Subsequently she underwent a another excision on 04/10/2015 with negative margins.  Vulva, excision, right anterior - EXTRAMAMMARY PAGET DISEASE. - RESECTION MARGINS ARE NEGATIVE. - NO INVASIVE CARCINOMA IDENTIFIED.  She did have a vulvar biopsy November 2017 that was negative.  This was on her right side.  She called stating that there is a new vulvar lesion and needed to be seen today.  She states that she looked at her vulva when she  called to schedule the appointment.  She not really sure what prompted her to look but she saw a whitish area on the labia minora on her left side.  In retrospect she has had a little bit of itching.  There is been no burning, bleeding, or discharge.  There is no symptoms on her right side.  I saw her in clinic in April and there was a visible lesion that was biopsied and consistent with Paget's disease.  ENCOUNTER DATE: 11/07/18 Surgery: Partial left vulvectomy Operative findings: 1 cm white hyperkeratotic lesion on left labia minora close to clitoris. No other lesions were appreciated with close inspection of the vulva before and after the application of acetic acid, post operative changes throughout the vulva.  Diagnosis Vulva, biopsy, left - EXTRAMAMMARY PAGET DISEASE - SEE COMMENT Microscopic Comment Paget's extends to the tips of the resection specimen. Dr. [name] reviewed the case and is in agreement with this diagnosis and assessment.  DATE: 11/07/18  Surgery: Partial left vulvectomy Operative findings: 1 cm white hyperkeratotic lesion on left labia minora close to clitoris. No other lesions were appreciated with close inspection of the vulva before and after the application of acetic acid, post operative changes throughout the vulva.  Interval History: I saw her 9/20 and a biopsy revealed  Diagnosis Vulva, biopsy, right - EXTRAMAMMARY PAGET'S DISEASE. SEE NOTE  After the discussion of options she opted for aldara and comes in today for follow up.  She is  very pleased with how she has done with the Aldara cream.  She was able to previously see the lesion and she states that about a week ago she was no longer able to.  She was very fearful to use the cream as she had read the side effects but really other than a little bit of irritation at the very beginning she has been very pleased.  She does notice a little bit of itching at the top near the clitoris that is new.  She is  wondering if that might be another area of Paget's disease.  She is otherwise doing well and has no complaints.  She enjoyed the Thanksgiving holiday and is looking forward to Christmas.   Allergies  Allergen Reactions  . Ibuprofen Hypertension  . Naprosyn [Naproxen] Nausea Only  . Other     Allergic to toothpaste- makes mouth peel Hay fever/dust mites/trees "365 allergic"   . Sevoflurane Nausea And Vomiting  . Azithromycin Itching and Rash  . Sulfa Antibiotics Hives and Rash    Past Medical History:  Diagnosis Date  . Allergic rhinitis   . Arthritis   . Asthma    mild no inaler use  . GERD (gastroesophageal reflux disease)   . Headache(784.0)    Migraines  . History of adenomatous polyp of colon   . History of concussion    AS CHILD--  NO RESIDUAL  . History of palpitations   . Hypertension   . Paget's disease of vulva   . PONV (postoperative nausea and vomiting)    SEVERE  . Wears glasses     Past Surgical History:  Procedure Laterality Date  . mucoid cyst removal thumb    . 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 PULLEY RELEASE RIGHT THUMB  03-23-2011  . SIMPLE VULVECTOMY  03-24-2010   right side  . TONSILLECTOMY  1977  . TRIGGER FINGER RELEASE    . VAGINAL HYSTERECTOMY  1989   and Anterior and posterior repair's for prolapse partial  . VULVECTOMY Right 01/10/2015   Procedure: RIGHT WIDE LOCAL EXCISION VULVECTOMY;  Surgeon: [name], MD;  Location: [facility];  Service: Gynecology;  Laterality: Right;  Marland Kitchen VULVECTOMY Right 04/10/2015   Procedure: WIDE LOCAL EXCISION VULVA;  Surgeon: [name], MD;  Location: [facility];  Service: Gynecology;  Laterality: Right;  Marland Kitchen VULVECTOMY PARTIAL N/A 11/07/2018   Procedure: VULVECTOMY PARTIAL;  Surgeon: [name], MD;  Location: [facility];  Service: Gynecology;  Laterality:  N/A;  . WISDOM TOOTH EXTRACTION      Current Outpatient Medications  Medication Sig Dispense Refill  . clobetasol cream (TEMOVATE) 2.20 % Apply 1 application topically 2 (two) times daily. To the vulva (Patient not taking: Reported on 04/17/2019) 30 g 0  . cyclobenzaprine (FLEXERIL) 5 MG tablet TAKE 1 TO 2 TABLETS(5 TO 10 MG) BY MOUTH TWICE DAILY AS NEEDED FOR MUSCLE SPASMS 40 tablet 0  . diphenhydrAMINE (BENADRYL) 25 MG tablet Take 25 mg by mouth. 2 tablets twice daily    . famotidine (PEPCID) 40 MG tablet TAKE 1/2 TABLET BY MOUTH TWICE A DAY 90 tablet 1  . imiquimod (ALDARA) 5 % cream Apply topically 3 (three) times a week. Apply to right vulva 3 times a week, use for 12 weeks 12 each 1  . lansoprazole (PREVACID) 15 MG capsule Take 1 capsule (15 mg total) by mouth 2 (two)  times daily before a meal.    . levocetirizine (XYZAL) 5 MG tablet Take 5 mg by mouth every evening.    Marland Kitchen losartan-hydrochlorothiazide (HYZAAR) 100-25 MG tablet TK 1 T PO QD 90 tablet 3  . metoprolol tartrate (LOPRESSOR) 25 MG tablet Take 0.5 tablets (12.5 mg total) by mouth 2 (two) times daily. 90 tablet 3  . montelukast (SINGULAIR) 10 MG tablet Take 1 tablet (10 mg total) by mouth every morning. 90 tablet 0  . Triamcinolone & Emollient (DERMASORB TA) 0.1 % KIT   0  . triamcinolone ointment (KENALOG) 0.5 % Apply 1 application topically 2 (two) times daily. 30 g 1   No current facility-administered medications for this visit.     Social History   Socioeconomic History  . Marital status: Married    Spouse name: Not on file  . Number of children: Not on file  . Years of education: Not on file  . Highest education level: Not on file  Occupational History  . 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 Financial resource strain: Not hard at all  . Food insecurity    Worry: Never true    Inability: Never true  . Transportation needs    Medical: No    Non-medical: No  Tobacco Use  . Smoking  status: Former Smoker    Years: 5.00    Types: Cigarettes    Quit date: 04/15/1989    Years since quitting: 30.0  . Smokeless tobacco: Never Used  Substance and Sexual Activity  . Alcohol use: Yes    Alcohol/week: 1.0 standard drinks    Types: 1 Glasses of wine per week    Comment: occasional wine  . Drug use: No  . Sexual activity: Not on file  Lifestyle  . Physical activity    Days per week: 0 days    Minutes per session: 0 min  . Stress: Not at all  Relationships  . Social Herbalist on phone: Not on file    Gets together: Not on file    Attends religious service: Not on file    Active member of club or organization: Not on file    Attends meetings of clubs or organizations: Not on file    Relationship status: Not on file  . Intimate partner violence    Fear of current or ex partner: No    Emotionally abused: No    Physically abused: No    Forced sexual activity: No  Other Topics Concern  . Not on file  Social History Narrative   She is married.  She is a Forensic psychologist at [employer]   Occasional wine, former smoker not now no substances    Family History  Problem Relation Age of Onset  . Cancer Maternal Grandmother   . Hypertension Father   . Heart disease Father   . Asthma Father   . Hypertension Mother   . Heart disease Mother   . Breast cancer Paternal Grandmother 69  . Colon cancer Paternal Grandmother   . Colon cancer Paternal Uncle   . Allergic rhinitis Neg Hx   . Angioedema Neg Hx   . Eczema Neg Hx   . Immunodeficiency Neg Hx   . Urticaria Neg Hx   . Esophageal cancer Neg Hx   . Rectal cancer Neg Hx   . Stomach cancer Neg Hx      Vitals: . Please refer to epic  Physical Exam: Well-nourished well-developed female  in no acute distress.  Pelvic: External genitalia notable for a small 1 cm patch of what appears to be Paget's just to the right side of her clitoris.  The previous lesion on the inferior right labia minora is completely resolved.   The entire vulva was inspected and there are no other concerning lesions.  She does have a small skin tear in the crease of her right leg most likely from undergarments.  Kasi Lasky A., MD 05/09/2019, 10:50 AM

## 2019-05-09 ENCOUNTER — Inpatient Hospital Stay: Payer: Medicare Other | Attending: Gynecologic Oncology | Admitting: Gynecologic Oncology

## 2019-05-09 ENCOUNTER — Other Ambulatory Visit: Payer: Self-pay

## 2019-05-09 ENCOUNTER — Encounter: Payer: Self-pay | Admitting: Gynecologic Oncology

## 2019-05-09 VITALS — BP 150/76 | HR 63 | Temp 97.8°F | Resp 18 | Ht 64.0 in | Wt 190.3 lb

## 2019-05-09 DIAGNOSIS — C519 Malignant neoplasm of vulva, unspecified: Secondary | ICD-10-CM | POA: Insufficient documentation

## 2019-05-09 DIAGNOSIS — M1990 Unspecified osteoarthritis, unspecified site: Secondary | ICD-10-CM | POA: Diagnosis not present

## 2019-05-09 DIAGNOSIS — K219 Gastro-esophageal reflux disease without esophagitis: Secondary | ICD-10-CM | POA: Insufficient documentation

## 2019-05-09 DIAGNOSIS — I10 Essential (primary) hypertension: Secondary | ICD-10-CM | POA: Diagnosis not present

## 2019-05-09 DIAGNOSIS — Z87891 Personal history of nicotine dependence: Secondary | ICD-10-CM | POA: Insufficient documentation

## 2019-05-09 DIAGNOSIS — Z79899 Other long term (current) drug therapy: Secondary | ICD-10-CM | POA: Diagnosis not present

## 2019-05-09 DIAGNOSIS — M79645 Pain in left finger(s): Secondary | ICD-10-CM | POA: Diagnosis not present

## 2019-05-09 DIAGNOSIS — M1812 Unilateral primary osteoarthritis of first carpometacarpal joint, left hand: Secondary | ICD-10-CM | POA: Diagnosis not present

## 2019-05-09 DIAGNOSIS — R2232 Localized swelling, mass and lump, left upper limb: Secondary | ICD-10-CM | POA: Diagnosis not present

## 2019-05-09 DIAGNOSIS — C4499 Other specified malignant neoplasm of skin, unspecified: Secondary | ICD-10-CM

## 2019-05-09 MED ORDER — IMIQUIMOD 5 % EX CREA: TOPICAL_CREAM | CUTANEOUS | 1 refills | Status: DC

## 2019-05-09 NOTE — Patient Instructions (Signed)
It was great to see you today and I am so pleased the cream is working for you.  As we discussed, please stop using it in the area of the prior lesion and shift upwards towards the area that we looked at with a mirror together today.  Please return to see [place] in 3 months.  Happy holidays!

## 2019-05-14 DIAGNOSIS — M5413 Radiculopathy, cervicothoracic region: Secondary | ICD-10-CM | POA: Diagnosis not present

## 2019-05-14 DIAGNOSIS — M5417 Radiculopathy, lumbosacral region: Secondary | ICD-10-CM | POA: Diagnosis not present

## 2019-05-14 DIAGNOSIS — M9901 Segmental and somatic dysfunction of cervical region: Secondary | ICD-10-CM | POA: Diagnosis not present

## 2019-05-14 DIAGNOSIS — M9902 Segmental and somatic dysfunction of thoracic region: Secondary | ICD-10-CM | POA: Diagnosis not present

## 2019-05-15 ENCOUNTER — Other Ambulatory Visit: Payer: Self-pay | Admitting: Orthopedic Surgery

## 2019-05-15 DIAGNOSIS — R2232 Localized swelling, mass and lump, left upper limb: Secondary | ICD-10-CM

## 2019-05-17 DIAGNOSIS — M9902 Segmental and somatic dysfunction of thoracic region: Secondary | ICD-10-CM | POA: Diagnosis not present

## 2019-05-17 DIAGNOSIS — M546 Pain in thoracic spine: Secondary | ICD-10-CM | POA: Diagnosis not present

## 2019-05-17 DIAGNOSIS — M531 Cervicobrachial syndrome: Secondary | ICD-10-CM | POA: Diagnosis not present

## 2019-05-17 DIAGNOSIS — M9901 Segmental and somatic dysfunction of cervical region: Secondary | ICD-10-CM | POA: Diagnosis not present

## 2019-05-21 ENCOUNTER — Other Ambulatory Visit: Payer: Self-pay | Admitting: Family Medicine

## 2019-05-23 ENCOUNTER — Other Ambulatory Visit: Payer: Self-pay | Admitting: Family Medicine

## 2019-05-25 IMAGING — US US ABDOMEN COMPLETE
1 series · 14 of 25 positions shown · non-contrast
Comparison: [DATE].

CLINICAL DATA: Abdominal pain.

EXAM:
ABDOMEN ULTRASOUND COMPLETE

[Series 1: us abdomen complete · 0.22mm/px · 14 of 149 slices shown]
[im 1/149]
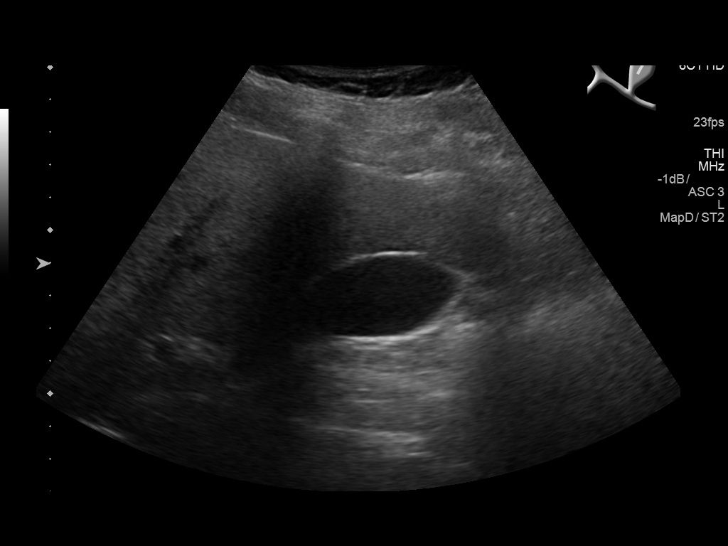
[im 13/149]
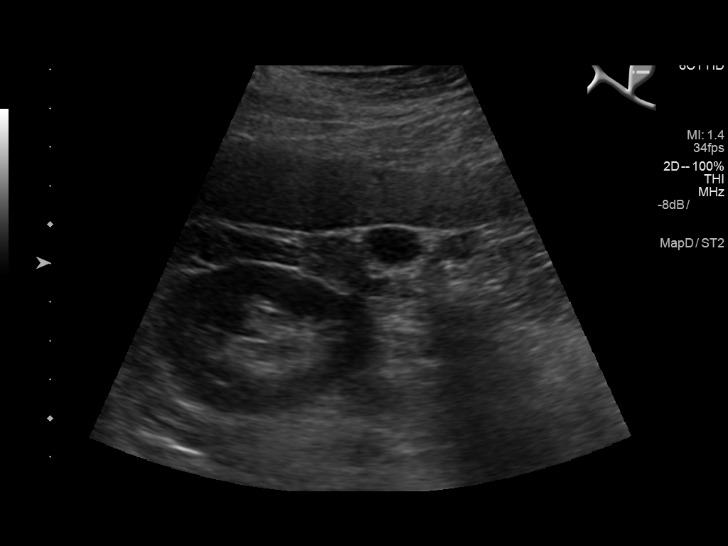
[im 25/149]
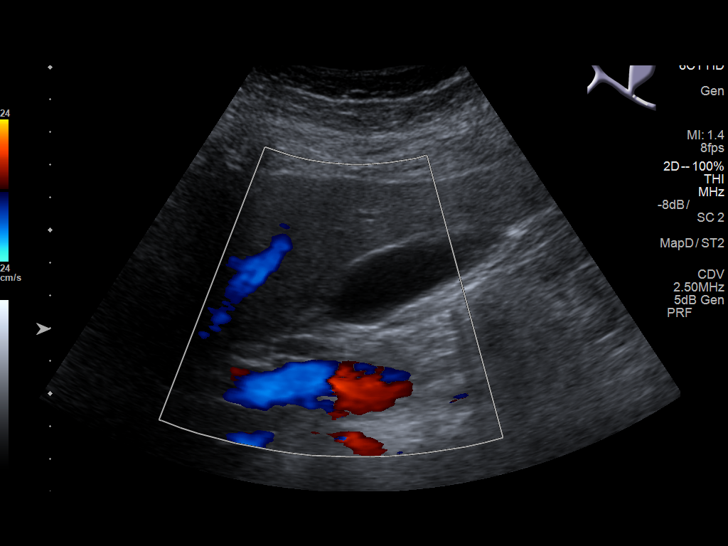
[im 38/149]
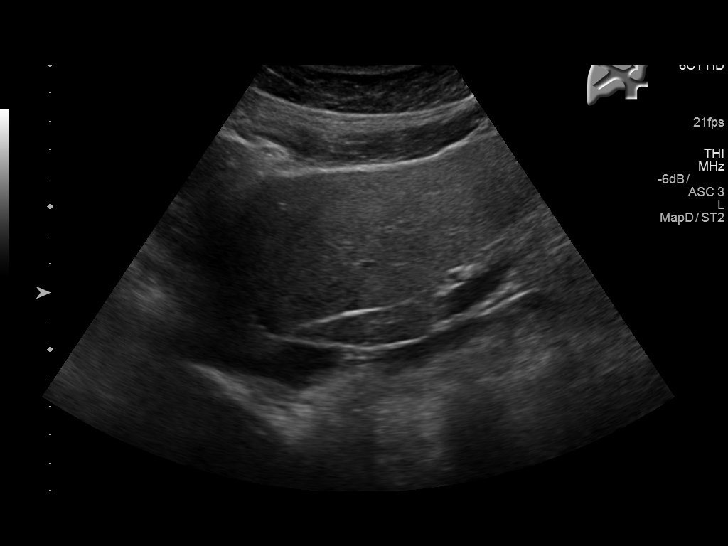
[im 50/149]
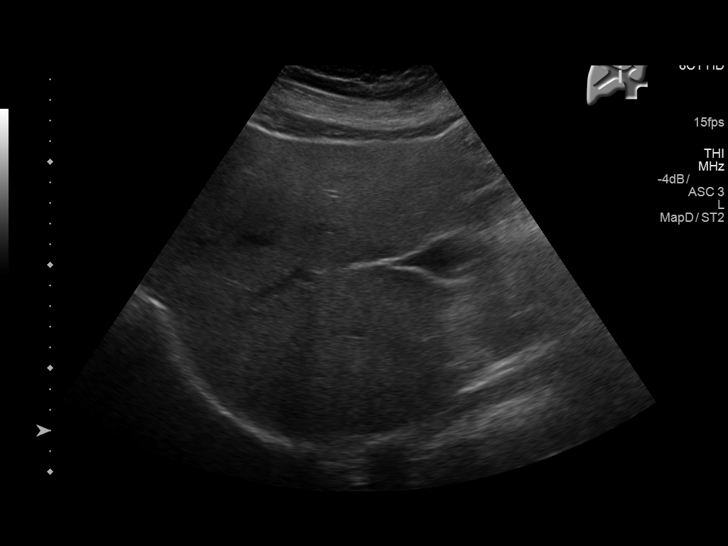
[im 56/149]
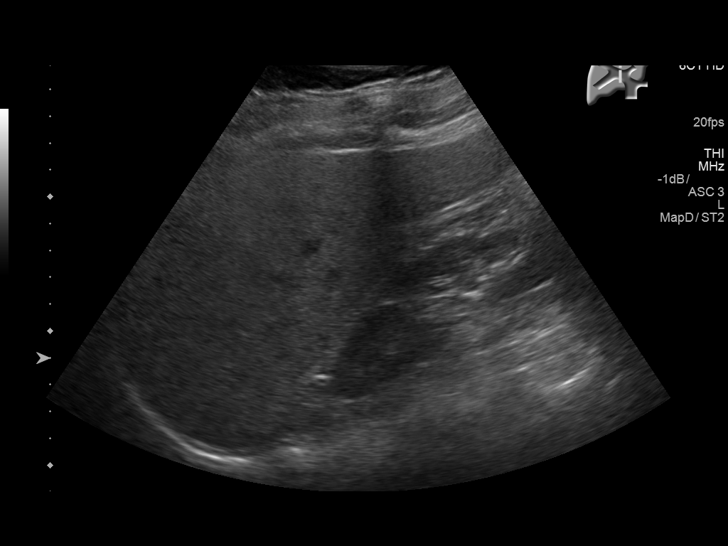
[im 68/149]
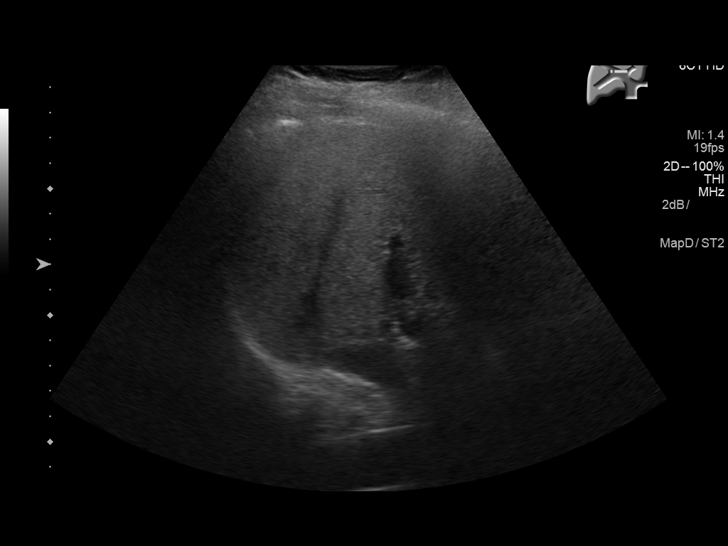
[im 81/149]
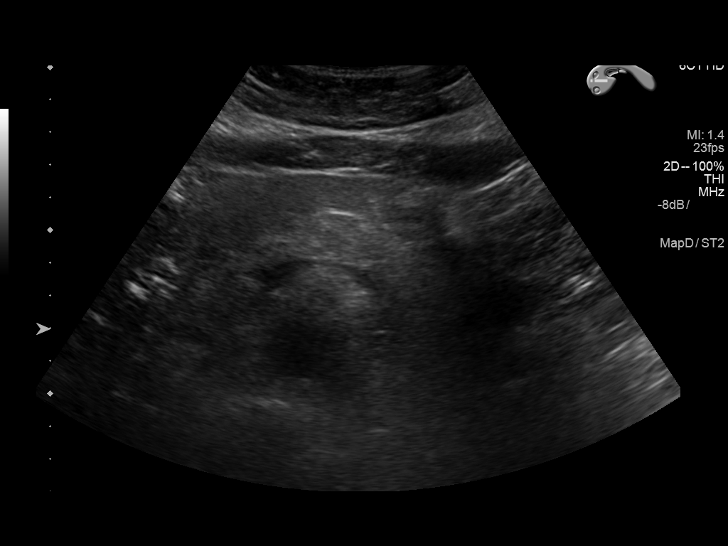
[im 93/149]
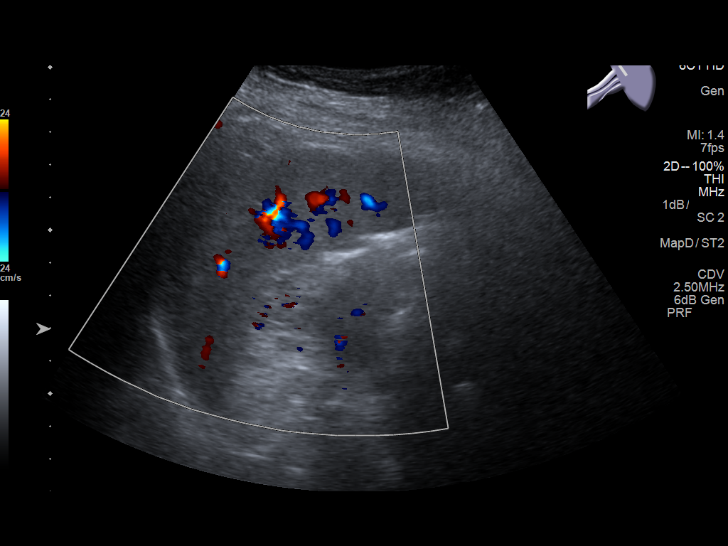
[im 99/149]
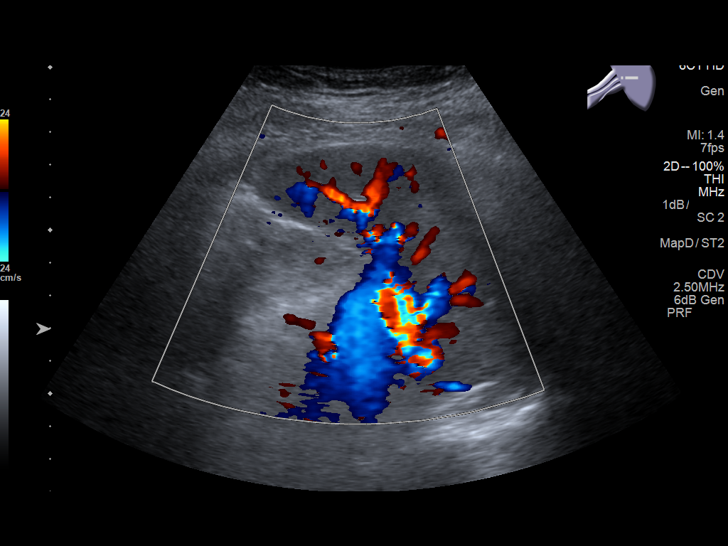
[im 112/149]
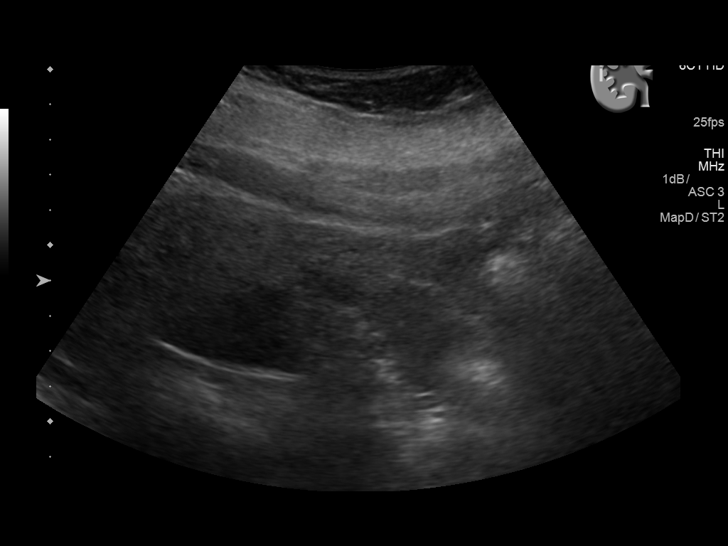
[im 124/149]
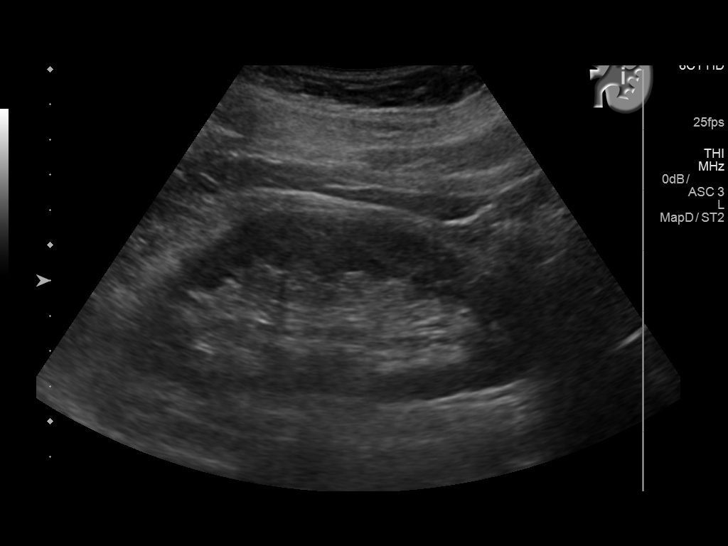
[im 136/149]
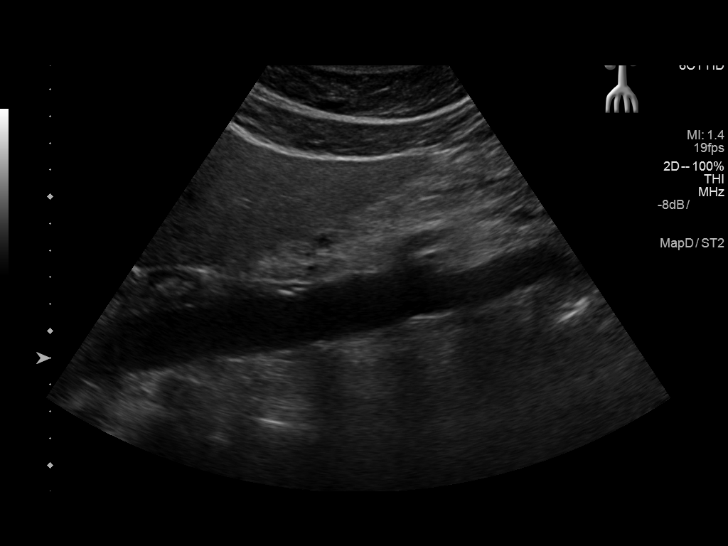
[im 149/149]
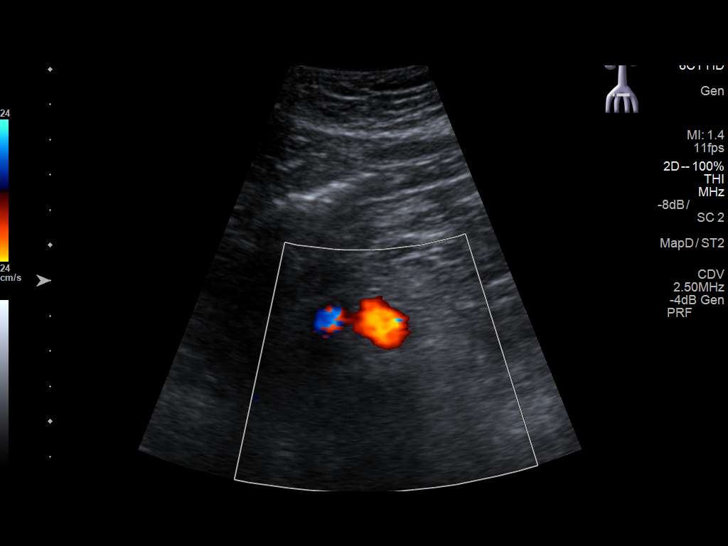

[14 of 25 positions shown; findings below may reference images not displayed]

FINDINGS: Gallbladder: No gallstones or wall thickening visualized. No
sonographic Murphy sign noted by sonographer.

Common bile duct: Diameter: 7.0 mm

Liver: Increased echogenicity consistent fatty infiltration and/or
hepatocellular disease. Portal vein is patent on color Doppler
imaging with normal direction of blood flow towards the liver.

IVC: No abnormality visualized.

Pancreas: Visualized portion unremarkable.

Spleen: Size and appearance within normal limits.

Right Kidney: Length: 11.8 cm. Echogenicity within normal limits. No
mass or hydronephrosis visualized.

Left Kidney: Length: 12.5 cm. Echogenicity within normal limits. No
mass or hydronephrosis visualized.

Abdominal aorta: No aneurysm visualized.

Other findings: None.
IMPRESSION: 1. Increased hepatic echogenicity consistent fatty infiltration or
hepatocellular disease. No focal hepatic abnormality identified.

2. Exam is otherwise unremarkable. No gallstones or biliary
distention.

## 2019-05-28 ENCOUNTER — Other Ambulatory Visit: Payer: Self-pay | Admitting: Orthopedic Surgery

## 2019-05-28 ENCOUNTER — Ambulatory Visit
Admission: RE | Admit: 2019-05-28 | Discharge: 2019-05-28 | Disposition: A | Discharge status: Home / Self Care | Payer: Medicare Other | Source: Ambulatory Visit | Attending: Orthopedic Surgery | Admitting: Orthopedic Surgery

## 2019-05-28 DIAGNOSIS — R2232 Localized swelling, mass and lump, left upper limb: Secondary | ICD-10-CM

## 2019-05-28 IMAGING — US US EXTREM UP*L* LTD
1 series · 5 of 5 positions shown · non-contrast
Comparison: None.

CLINICAL DATA: Soft tissue mass at the base of the left index
finger.

EXAM:
ULTRASOUND LEFT UPPER EXTREMITY LIMITED
TECHNIQUE: Ultrasound examination of the upper extremity soft tissues was
performed in the area of clinical concern.

[Series 1: us extrem up*left* ltd · 0.04mm/px · 5 acquisitions, 5 frames shown]
[im 1/5]
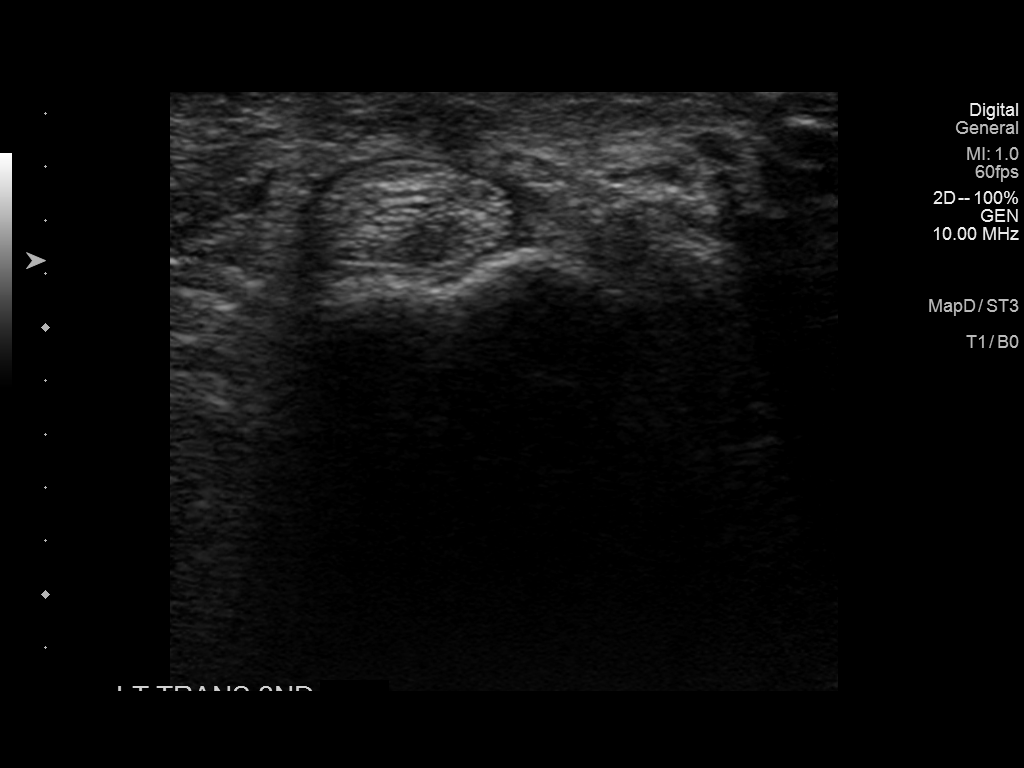
[im 2/5]
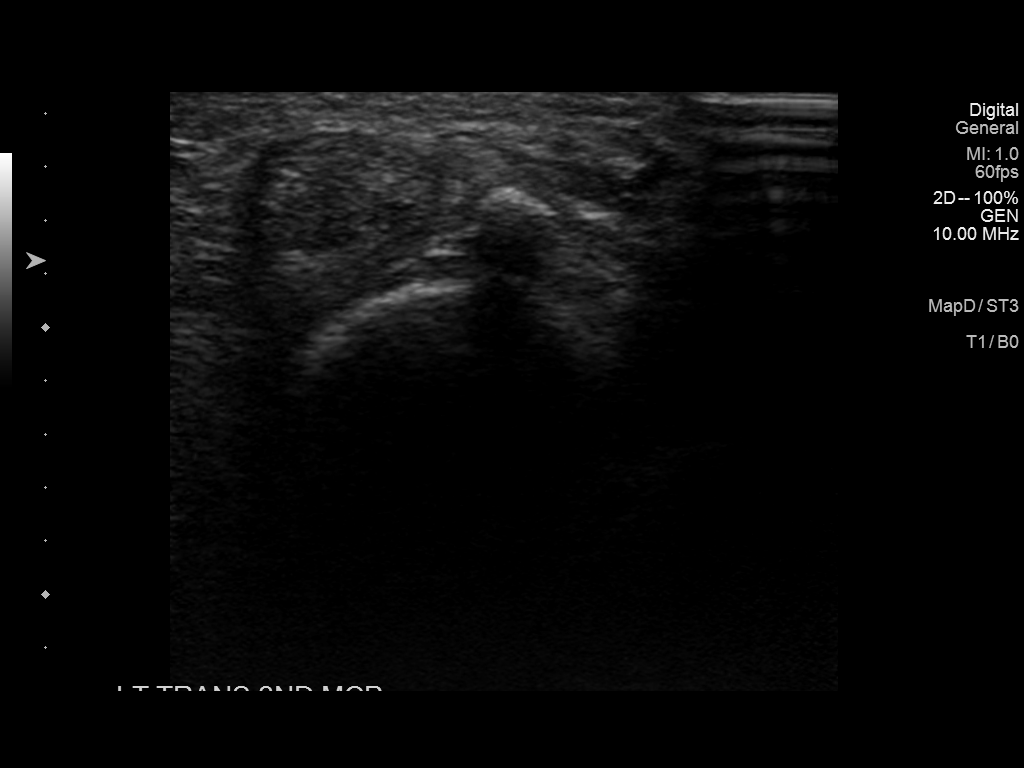
[im 3/5]
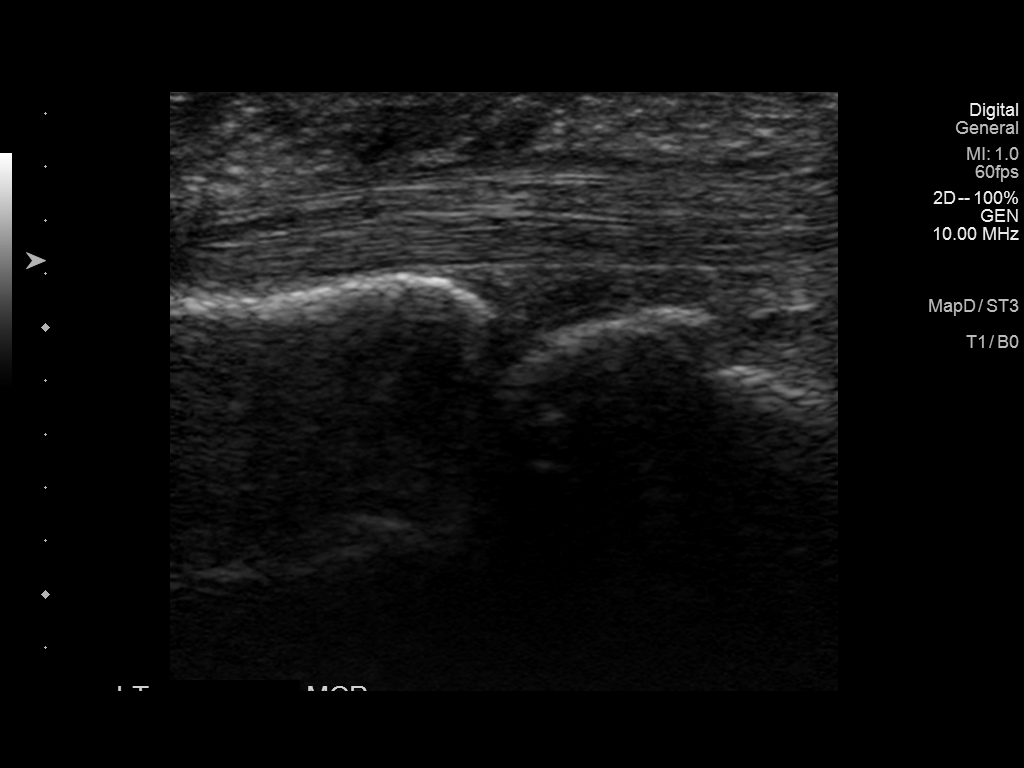
[im 4/5]
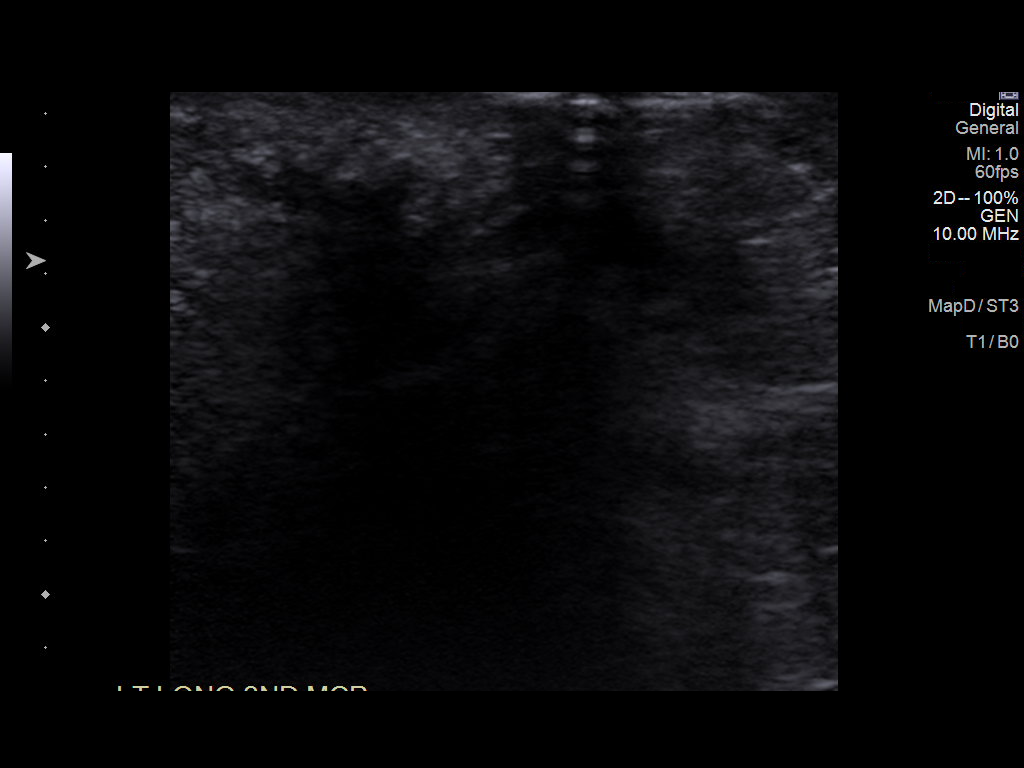
[im 5/5]
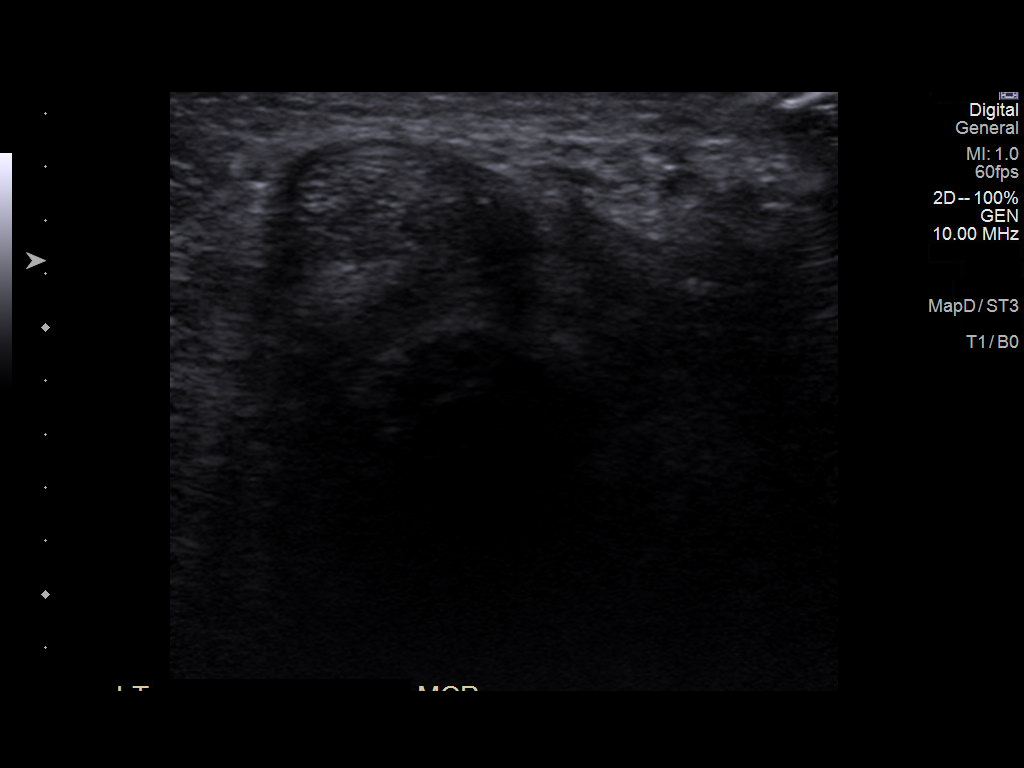

[5 of 5 positions shown; findings below may reference images not displayed]

FINDINGS: I personally performed ultrasound over the area of concern. The
second MCP joint appears normal with no effusion. The flexor tendon
to the index finger appears normal. Patient does have a 3 mm
accessory ossicle in the area of concern. There is also a small
osteophyte on the head of the second metacarpal. There is no mass or
cyst or other soft tissue lesion in the area of concern.
IMPRESSION: Negative ultrasound of the soft tissues at the base of the left
index finger. Palpable fullness is secondary to the normal flexor
tendon and adjacent muscle and the small accessory ossicle and
adjacent osteophyte.

## 2019-05-29 DIAGNOSIS — M9901 Segmental and somatic dysfunction of cervical region: Secondary | ICD-10-CM | POA: Diagnosis not present

## 2019-05-29 DIAGNOSIS — M9902 Segmental and somatic dysfunction of thoracic region: Secondary | ICD-10-CM | POA: Diagnosis not present

## 2019-05-29 DIAGNOSIS — M546 Pain in thoracic spine: Secondary | ICD-10-CM | POA: Diagnosis not present

## 2019-05-29 DIAGNOSIS — M531 Cervicobrachial syndrome: Secondary | ICD-10-CM | POA: Diagnosis not present

## 2019-06-06 DIAGNOSIS — R2232 Localized swelling, mass and lump, left upper limb: Secondary | ICD-10-CM | POA: Diagnosis not present

## 2019-06-06 DIAGNOSIS — M1812 Unilateral primary osteoarthritis of first carpometacarpal joint, left hand: Secondary | ICD-10-CM | POA: Diagnosis not present

## 2019-06-11 ENCOUNTER — Telehealth: Payer: Self-pay

## 2019-06-11 NOTE — Telephone Encounter (Signed)
Told Ms [name] that the Aldara is topical chemotherapy and it can cause burning, itching irritation, inflammation,and tenderness where it is applied.  This will heal once the treatment  has stopped per [name], NP. Pt verbalized understanding. Gave pt appointment on 08-08-19 at 0900 with Dr. Lahoma Crocker.

## 2019-06-12 DIAGNOSIS — M531 Cervicobrachial syndrome: Secondary | ICD-10-CM | POA: Diagnosis not present

## 2019-06-12 DIAGNOSIS — M9902 Segmental and somatic dysfunction of thoracic region: Secondary | ICD-10-CM | POA: Diagnosis not present

## 2019-06-12 DIAGNOSIS — M9901 Segmental and somatic dysfunction of cervical region: Secondary | ICD-10-CM | POA: Diagnosis not present

## 2019-06-12 DIAGNOSIS — M546 Pain in thoracic spine: Secondary | ICD-10-CM | POA: Diagnosis not present

## 2019-06-21 ENCOUNTER — Other Ambulatory Visit: Payer: Self-pay | Admitting: Family Medicine

## 2019-06-26 DIAGNOSIS — M9901 Segmental and somatic dysfunction of cervical region: Secondary | ICD-10-CM | POA: Diagnosis not present

## 2019-06-26 DIAGNOSIS — M546 Pain in thoracic spine: Secondary | ICD-10-CM | POA: Diagnosis not present

## 2019-06-26 DIAGNOSIS — M531 Cervicobrachial syndrome: Secondary | ICD-10-CM | POA: Diagnosis not present

## 2019-06-26 DIAGNOSIS — M9902 Segmental and somatic dysfunction of thoracic region: Secondary | ICD-10-CM | POA: Diagnosis not present

## 2019-07-10 DIAGNOSIS — M531 Cervicobrachial syndrome: Secondary | ICD-10-CM | POA: Diagnosis not present

## 2019-07-10 DIAGNOSIS — M546 Pain in thoracic spine: Secondary | ICD-10-CM | POA: Diagnosis not present

## 2019-07-10 DIAGNOSIS — M9902 Segmental and somatic dysfunction of thoracic region: Secondary | ICD-10-CM | POA: Diagnosis not present

## 2019-07-10 DIAGNOSIS — M9901 Segmental and somatic dysfunction of cervical region: Secondary | ICD-10-CM | POA: Diagnosis not present

## 2019-07-16 ENCOUNTER — Telehealth: Payer: Self-pay

## 2019-07-16 ENCOUNTER — Telehealth: Payer: Self-pay | Admitting: *Deleted

## 2019-07-16 NOTE — Telephone Encounter (Signed)
Spoke with Ms [name].She stated that the area on the vulva after using the Aldara cream for 8 weeks has increased in size. The lesion was pink and smaller then a dime.   It is now larger then a quarter, darker red, and  raised with bumps. Told her that [name] Cross,NP  and Dr. Berline Lopes said to come in earlier to have Dr. Lahoma Crocker look at the area . It may need to be biopsied. Appointment was made for 07-25-19 at 1015. Told Ms [name] to continue with Aldara three times this week as scheduled, but hold next weeks treatments until seen by Dr. Delsa Sale. Pt verbalized understanding.

## 2019-07-16 NOTE — Telephone Encounter (Signed)
Patient called and stated "I have been using the cream and still have 4 weeks to go. The area that I applied the cream was about a dime size. Now it is bigger, darker and has bumps in it." Message given to the desk RN.

## 2019-07-18 ENCOUNTER — Telehealth: Payer: Self-pay | Admitting: *Deleted

## 2019-07-18 NOTE — Telephone Encounter (Signed)
Patient and moved the patient's appt from 2/17 to 2/15

## 2019-07-20 NOTE — Progress Notes (Signed)
Gynecologic Oncology Return Clinic Visit  07/23/19  Reason for Visit: Vulvar irritation in the setting of Aldara treatment for recurrent Paget's disease of the vulva  Treatment History: The patient previously had a supracervical hysterectomy but has her ovaries and cervix in situ. Surgery for Paget's disease of the vulva in October 2011.  There was focal involvement of the medial margin.  The entire lesion is approximately 5 cm in diameter.  She then underwent wide local excision of a recurrent Paget's lesion in August 2016.  She had surgical margins that were focally positive and subsequently underwent another excision on 04/10/2015 with negative margins. She had a vulvar biopsy from the right in November 2017 that was negative.   She was seen for a visible lesion and vulvar symptoms in April 2020 and a biopsy of this lesion was consistent with Paget's disease. On 11/07/2018, she underwent partial left vulvectomy with findings of a 1 cm white hyperkeratotic lesion on the left labia minora close to the clitoris.  Margins were positive.  Plan was for consideration of Aldara since repeat excision could involve removal of the clitoris. Patient was seen on 01/02/2019 with a slightly erythematous area on the right vulva.  Plan was for repeat examination in September with biopsy if persistence of the lesion. On 9/20, patient underwent right vulvar biopsy which revealed extramammary Paget's disease.  After discussion of treatment options, the patient opted for Aldara use. She was seen in early December, at which time her right vulvar lesion had completely resolved.  At this visit, a small right periclitoral lesion was noted.  Given her excellent response to the previous lesion, her decision was to proceed with Aldara use for this new lesion.  Interval History: Patient reports that she has been using Aldara on a right periclitoral lesion since she saw Dr. Alycia Rossetti on 12/2.  Several weeks ago, she noted that her  right upper labia was red, inflamed and sore.  The symptoms have been worsening over the last several weeks.  She has not used the Aldara cream since Friday and thought that the vulva was looking less inflamed yesterday.  About a month ago, she was not able to visualize the pink lesion on the anterior vulva.  She had been using the Aldara cream 3 times a week.  Yesterday, she noted some minimal discharge on her peripad.  She denies any vaginal bleeding.  The patient has been using a left wrist brace for arthritis that she feels most notably over her left thumb.  She has a different brace that she uses during the daytime and 1 at night.  She has had some relief of her arthritis symptoms with the use of these braces.  Past Medical/Surgical History: Past Medical History:  Diagnosis Date  . Allergic rhinitis   . Arthritis   . Asthma    mild no inaler use  . GERD (gastroesophageal reflux disease)   . Headache(784.0)    Migraines  . History of adenomatous polyp of colon   . History of concussion    AS CHILD--  NO RESIDUAL  . History of palpitations   . Hypertension   . Paget's disease of vulva   . PONV (postoperative nausea and vomiting)    SEVERE  . Wears glasses     Past Surgical History:  Procedure Laterality Date  . mucoid cyst removal thumb    . 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  PULLEY RELEASE RIGHT THUMB  03-23-2011  . SIMPLE VULVECTOMY  03-24-2010   right side  . TONSILLECTOMY  1977  . TRIGGER FINGER RELEASE    . VAGINAL HYSTERECTOMY  1989   and Anterior and posterior repair's for prolapse partial  . VULVECTOMY Right 01/10/2015   Procedure: RIGHT WIDE LOCAL EXCISION VULVECTOMY;  Surgeon: [name], MD;  Location: [place];  Service: Gynecology;  Laterality: Right;  Marland Kitchen VULVECTOMY Right 04/10/2015   Procedure: WIDE LOCAL EXCISION VULVA;  Surgeon: [name], MD;  Location: [facility];  Service: Gynecology;  Laterality: Right;  Marland Kitchen VULVECTOMY PARTIAL N/A 11/07/2018   Procedure: VULVECTOMY PARTIAL;  Surgeon: [name], MD;  Location: [facility];  Service: Gynecology;  Laterality: N/A;  . WISDOM TOOTH EXTRACTION      Family History  Problem Relation Age of Onset  . Cancer Maternal Grandmother   . Hypertension Father   . Heart disease Father   . Asthma Father   . Hypertension Mother   . Heart disease Mother   . Breast cancer Paternal Grandmother 66  . Colon cancer Paternal Grandmother   . Colon cancer Paternal Uncle   . Allergic rhinitis Neg Hx   . Angioedema Neg Hx   . Eczema Neg Hx   . Immunodeficiency Neg Hx   . Urticaria Neg Hx   . Esophageal cancer Neg Hx   . Rectal cancer Neg Hx   . Stomach cancer Neg Hx     Social History   Socioeconomic History  . Marital status: Married    Spouse name: Not on file  . Number of children: Not on file  . Years of education: Not on file  . Highest education level: Not on file  Occupational History  . 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 Smoking status: Former Smoker    Years: 5.00    Types: Cigarettes    Quit date: 04/15/1989    Years since quitting: 30.2  . Smokeless tobacco: Never Used  Substance and Sexual Activity  . Alcohol use: Yes    Alcohol/week: 1.0 standard drinks    Types: 1 Glasses of wine per week    Comment: occasional wine  . Drug use: No  . Sexual activity: Not on file  Other Topics Concern  . Not on file  Social History Narrative   She is married.  She is a Forensic psychologist at [employer]   Occasional wine, former smoker not now no substances   Social Determinants of Health   Financial Resource Strain: Low Risk   . Difficulty of Paying Living Expenses: Not hard at all  Food Insecurity: No Food Insecurity  . Worried About Charity fundraiser in the Last Year: Never true  . Ran Out of Food in the  Last Year: Never true  Transportation Needs: No Transportation Needs  . Lack of Transportation (Medical): No  . Lack of Transportation (Non-Medical): No  Physical Activity: Inactive  . Days of Exercise per Week: 0 days  . Minutes of Exercise per Session: 0 min  Stress: No Stress Concern Present  . Feeling of Stress : Not at all  Social Connections:   . Frequency of Communication with Friends and Family: Not on file  . Frequency of Social Gatherings with Friends and Family: Not on file  . Attends Religious Services: Not on file  . Active Member of Clubs or Organizations: Not on file  . Attends Club or  Organization Meetings: Not on file  . Marital Status: Not on file    Current Medications:  Current Outpatient Medications:  .  cyclobenzaprine (FLEXERIL) 5 MG tablet, TAKE 1 TO 2 TABLETS(5 TO 10 MG) BY MOUTH TWICE DAILY AS NEEDED FOR MUSCLE SPASMS, Disp: 40 tablet, Rfl: 0 .  diphenhydrAMINE (BENADRYL) 25 MG tablet, Take 50 mg by mouth at bedtime as needed for itching. 2 tablets twice daily , Disp: , Rfl:  .  famotidine (PEPCID) 40 MG tablet, TAKE 1/2 TABLET BY MOUTH TWICE DAILY, Disp: 90 tablet, Rfl: 3 .  imiquimod (ALDARA) 5 % cream, Apply topically 3 (three) times a week. Apply to right vulva 3 times a week, use for 12 weeks, Disp: 12 each, Rfl: 1 .  lansoprazole (PREVACID) 15 MG capsule, Take 1 capsule (15 mg total) by mouth 2 (two) times daily before a meal., Disp: , Rfl:  .  losartan-hydrochlorothiazide (HYZAAR) 100-25 MG tablet, TK 1 T PO QD, Disp: 90 tablet, Rfl: 3 .  metoprolol tartrate (LOPRESSOR) 25 MG tablet, Take 0.5 tablets (12.5 mg total) by mouth 2 (two) times daily., Disp: 90 tablet, Rfl: 3 .  montelukast (SINGULAIR) 10 MG tablet, TAKE 1 TABLET(10 MG) BY MOUTH EVERY MORNING, Disp: 90 tablet, Rfl: 3 .  Triamcinolone & Emollient (DERMASORB TA) 0.1 % KIT, , Disp: , Rfl: 0 .  triamcinolone ointment (KENALOG) 0.5 %, Apply 1 application topically 2 (two) times daily., Disp: 30 g,  Rfl: 1  Review of Systems: Denies appetite changes, fevers, chills, fatigue, unexplained weight changes. Denies hearing loss, neck lumps or masses, mouth sores, ringing in ears or voice changes. Denies cough or wheezing.  Denies shortness of breath. Denies chest pain or palpitations. Denies leg swelling. Denies abdominal distention, pain, blood in stools, constipation, diarrhea, nausea, vomiting, or early satiety. Denies pain with intercourse, dysuria, frequency, hematuria or incontinence. Denies hot flashes, pelvic pain, vaginal bleeding or vaginal discharge.   Denies joint pain, back pain or muscle pain/cramps. Denies itching, rash, or wounds. Denies dizziness, headaches, numbness or seizures. Denies swollen lymph nodes or glands, denies easy bruising or bleeding. Denies anxiety, depression, confusion, or decreased concentration.  Physical Exam: BP (!) 158/80   Pulse 62   Temp 97.8 F (36.6 C) (Temporal)   Resp 18   Ht 5' 4" (1.626 m)   Wt 187 lb (84.8 kg)   SpO2 97%   BMI 32.10 kg/m  General: Alert, oriented, no acute distress. HEENT: Atraumatic, normocephalic, sclera anicteric. Chest: Unlabored breathing on room air. GU: There is an approximately 3-4 cm by less than 1 cm mildly erythematous and ulcerated lesion extending from the right aspect superior to the clitoris down the right labia majora.  There is some crusting over ulcerated areas.  Patient has mild tenderness with palpation over this area.  No hyper keratotic lesions noted.  No other vulvar lesions noted.  Laboratory & Radiologic Studies: None new  Assessment & Plan: [name] is a 69 y.o. woman with history of recurrent Paget's disease of the vulva most recently treating a right periclitoral lesion with Aldara cream now with worsening inflammation and erythema for the last several weeks.  I am not able to appreciate the Paget's lesion initially being treated with the topical Aldara cream.  I suspect that the  symptoms and lesion that patient presents with today are related to side effect from the Aldara cream.  The patient has been using a Q-tip to place the cream on her skin but notes  that she slaughters the cream around the area.  Given what looks like an inflammatory reaction, I recommended that the patient stop Aldara use and return to see me in 3-4 weeks for repeat exam.  If at that time, there is still a lesion present, then I recommend proceeding with biopsy.  She knows to call the clinic if she develops worsening symptoms increasing size of the lesion, or new symptoms.  20 minutes of total time was spent for this patient encounter, including preparation, face-to-face counseling with the patient and coordination of care, and documentation of the encounter.  Jeral Pinch, MD  Division of Gynecologic Oncology  Department of Obstetrics and Gynecology  [facility], [place]

## 2019-07-23 ENCOUNTER — Other Ambulatory Visit: Payer: Self-pay

## 2019-07-23 ENCOUNTER — Inpatient Hospital Stay: Payer: Medicare Other | Attending: Gynecologic Oncology | Admitting: Gynecologic Oncology

## 2019-07-23 ENCOUNTER — Encounter: Payer: Self-pay | Admitting: Gynecologic Oncology

## 2019-07-23 VITALS — BP 158/80 | HR 62 | Temp 97.8°F | Resp 18 | Ht 64.0 in | Wt 187.0 lb

## 2019-07-23 DIAGNOSIS — Z87891 Personal history of nicotine dependence: Secondary | ICD-10-CM | POA: Diagnosis not present

## 2019-07-23 DIAGNOSIS — K219 Gastro-esophageal reflux disease without esophagitis: Secondary | ICD-10-CM | POA: Diagnosis not present

## 2019-07-23 DIAGNOSIS — I10 Essential (primary) hypertension: Secondary | ICD-10-CM | POA: Diagnosis not present

## 2019-07-23 DIAGNOSIS — C519 Malignant neoplasm of vulva, unspecified: Secondary | ICD-10-CM | POA: Insufficient documentation

## 2019-07-23 DIAGNOSIS — C4499 Other specified malignant neoplasm of skin, unspecified: Secondary | ICD-10-CM

## 2019-07-23 DIAGNOSIS — Z90710 Acquired absence of both cervix and uterus: Secondary | ICD-10-CM | POA: Insufficient documentation

## 2019-07-23 DIAGNOSIS — M1990 Unspecified osteoarthritis, unspecified site: Secondary | ICD-10-CM | POA: Insufficient documentation

## 2019-07-23 DIAGNOSIS — Z79899 Other long term (current) drug therapy: Secondary | ICD-10-CM | POA: Insufficient documentation

## 2019-07-23 NOTE — Patient Instructions (Signed)
I will see you in 3-4 weeks after not using your Aldara cream.  If there is still an area on the vulva at that time, then we will proceed with a biopsy.  If your symptoms worsen or the area becomes bigger before I see you again, please call the clinic at [phone].

## 2019-07-24 DIAGNOSIS — M9901 Segmental and somatic dysfunction of cervical region: Secondary | ICD-10-CM | POA: Diagnosis not present

## 2019-07-24 DIAGNOSIS — M546 Pain in thoracic spine: Secondary | ICD-10-CM | POA: Diagnosis not present

## 2019-07-24 DIAGNOSIS — M9902 Segmental and somatic dysfunction of thoracic region: Secondary | ICD-10-CM | POA: Diagnosis not present

## 2019-07-24 DIAGNOSIS — M531 Cervicobrachial syndrome: Secondary | ICD-10-CM | POA: Diagnosis not present

## 2019-07-25 ENCOUNTER — Ambulatory Visit: Payer: Medicare Other | Admitting: Obstetrics & Gynecology

## 2019-07-31 ENCOUNTER — Other Ambulatory Visit: Payer: Self-pay | Admitting: Family Medicine

## 2019-07-31 ENCOUNTER — Ambulatory Visit
Admission: RE | Admit: 2019-07-31 | Discharge: 2019-07-31 | Disposition: A | Discharge status: Home / Self Care | Payer: Medicare Other | Source: Ambulatory Visit | Attending: Family Medicine | Admitting: Family Medicine

## 2019-07-31 ENCOUNTER — Encounter: Payer: Self-pay | Admitting: Family Medicine

## 2019-07-31 DIAGNOSIS — E2839 Other primary ovarian failure: Secondary | ICD-10-CM

## 2019-07-31 DIAGNOSIS — Z1231 Encounter for screening mammogram for malignant neoplasm of breast: Secondary | ICD-10-CM | POA: Diagnosis not present

## 2019-07-31 DIAGNOSIS — N6320 Unspecified lump in the left breast, unspecified quadrant: Secondary | ICD-10-CM

## 2019-07-31 DIAGNOSIS — R928 Other abnormal and inconclusive findings on diagnostic imaging of breast: Secondary | ICD-10-CM

## 2019-07-31 DIAGNOSIS — M8589 Other specified disorders of bone density and structure, multiple sites: Secondary | ICD-10-CM | POA: Diagnosis not present

## 2019-07-31 DIAGNOSIS — Z780 Asymptomatic menopausal state: Secondary | ICD-10-CM | POA: Diagnosis not present

## 2019-07-31 DIAGNOSIS — M810 Age-related osteoporosis without current pathological fracture: Secondary | ICD-10-CM | POA: Insufficient documentation

## 2019-07-31 DIAGNOSIS — M8580 Other specified disorders of bone density and structure, unspecified site: Secondary | ICD-10-CM | POA: Insufficient documentation

## 2019-07-31 IMAGING — MG DIGITAL SCREENING BILAT W/ TOMO W/ CAD
8 series · 8 of 24 positions shown · non-contrast
Comparison: Previous exam(s).

CLINICAL DATA: Screening.

EXAM:
DIGITAL SCREENING BILATERAL MAMMOGRAM WITH TOMO AND CAD

[R MLO synth-2D]
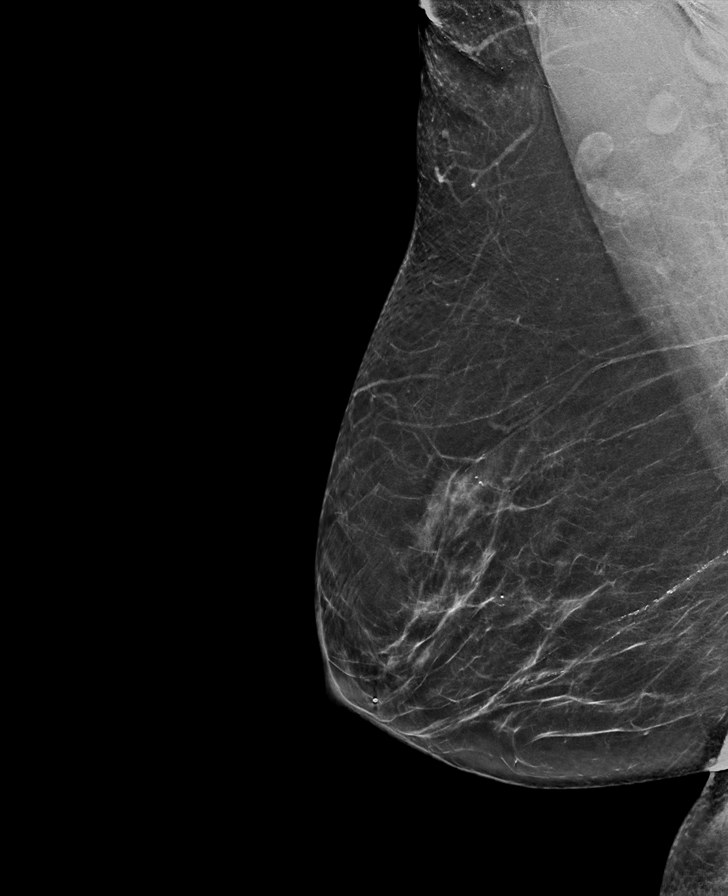

[R CC synth-2D]
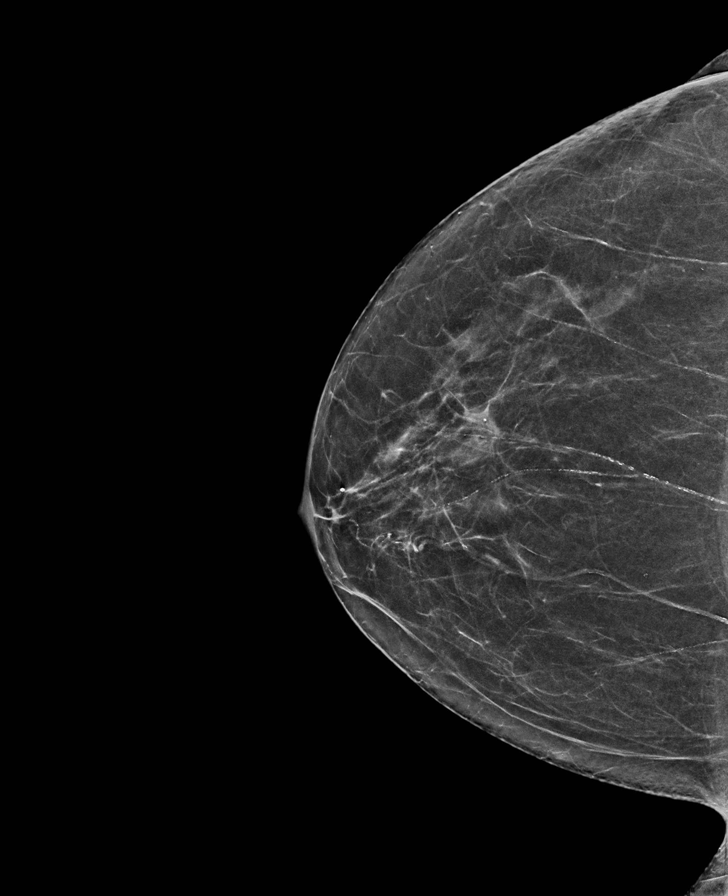

[L CC synth-2D]
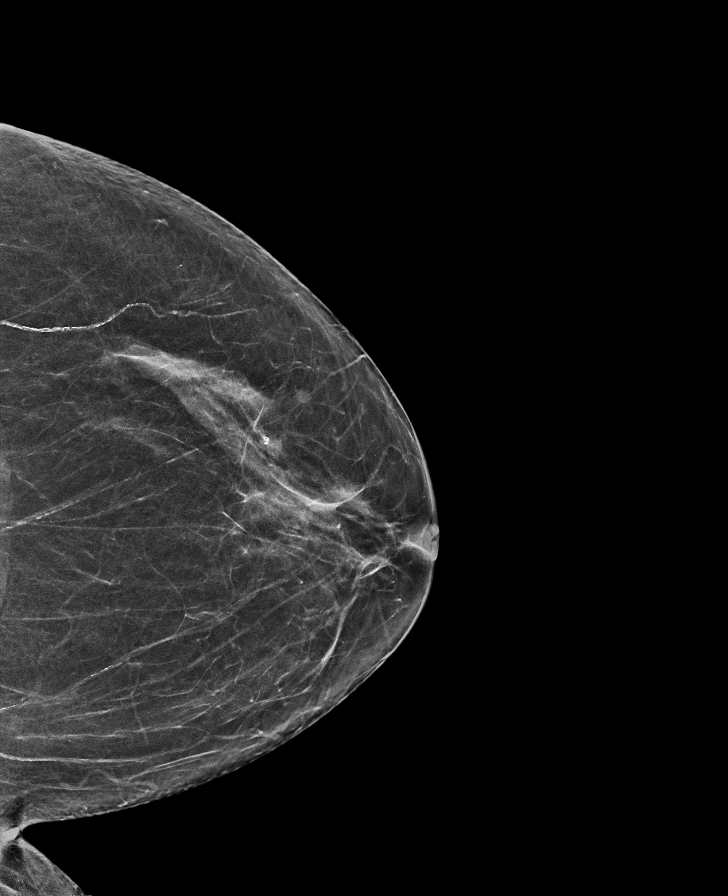

[L MLO synth-2D]
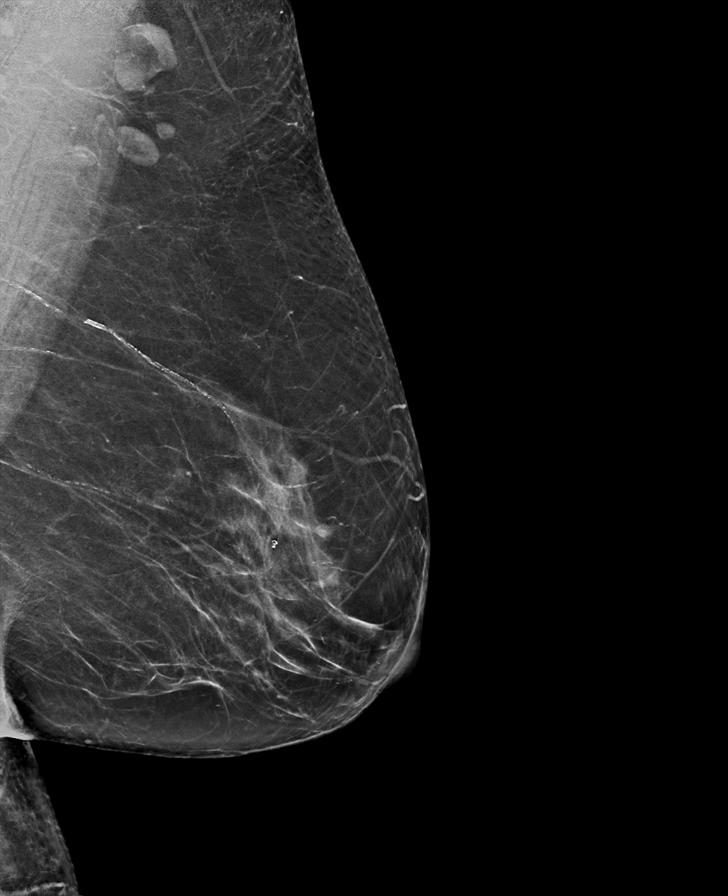

[R CC tomo · tomo slice 29/58.0]
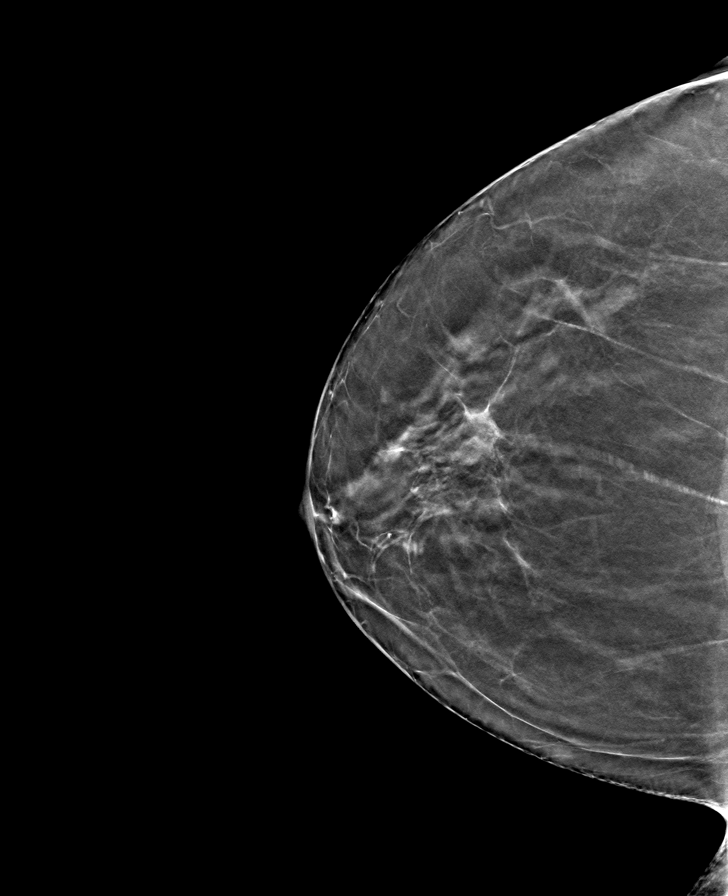

[L MLO tomo · tomo slice 35/68.0]
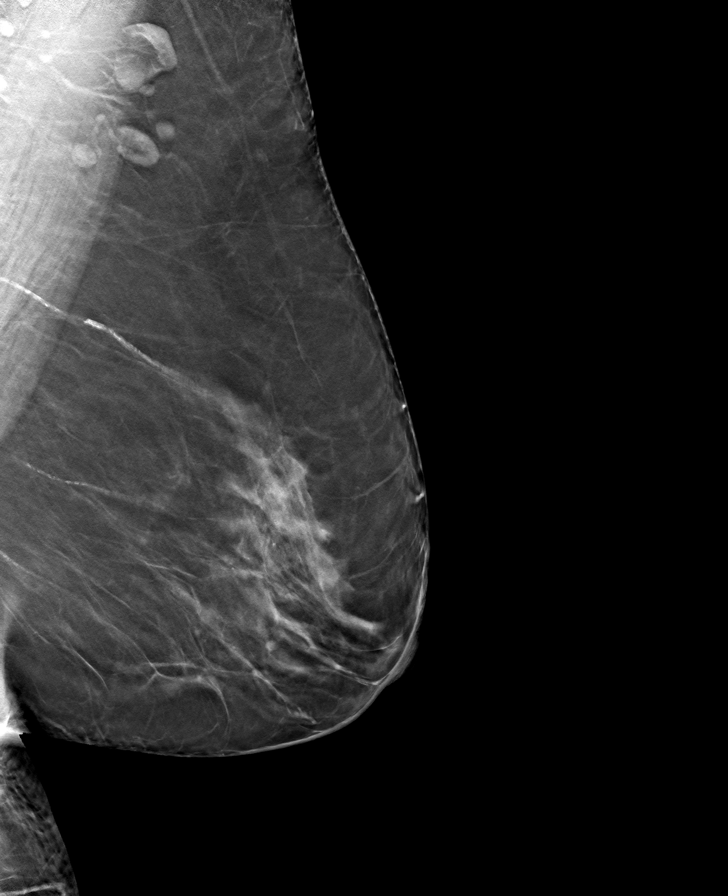

[R MLO tomo · tomo slice 35/68.0]
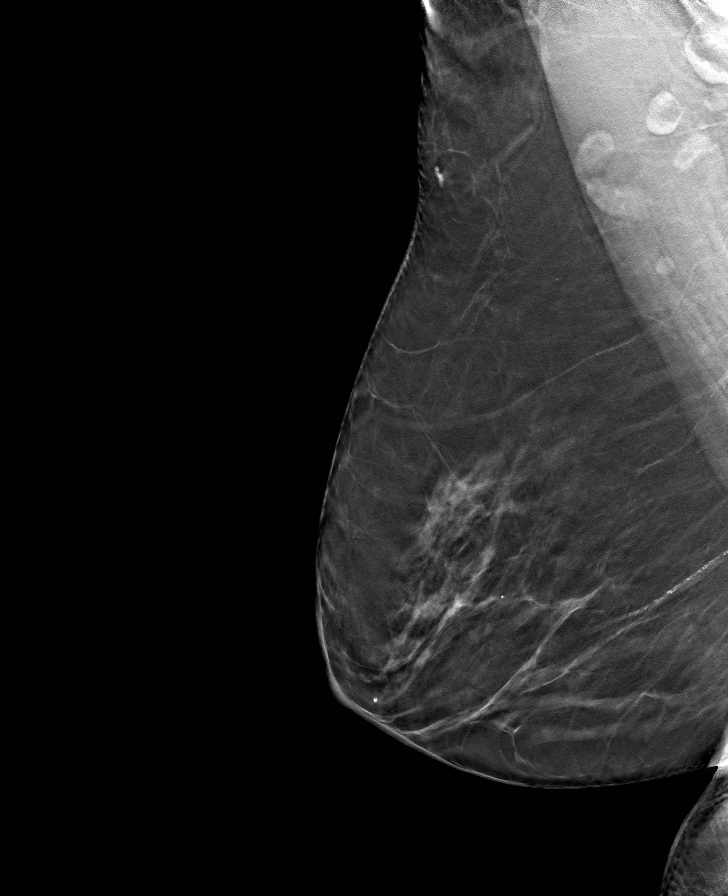

[L CC tomo · tomo slice 30/59.0]
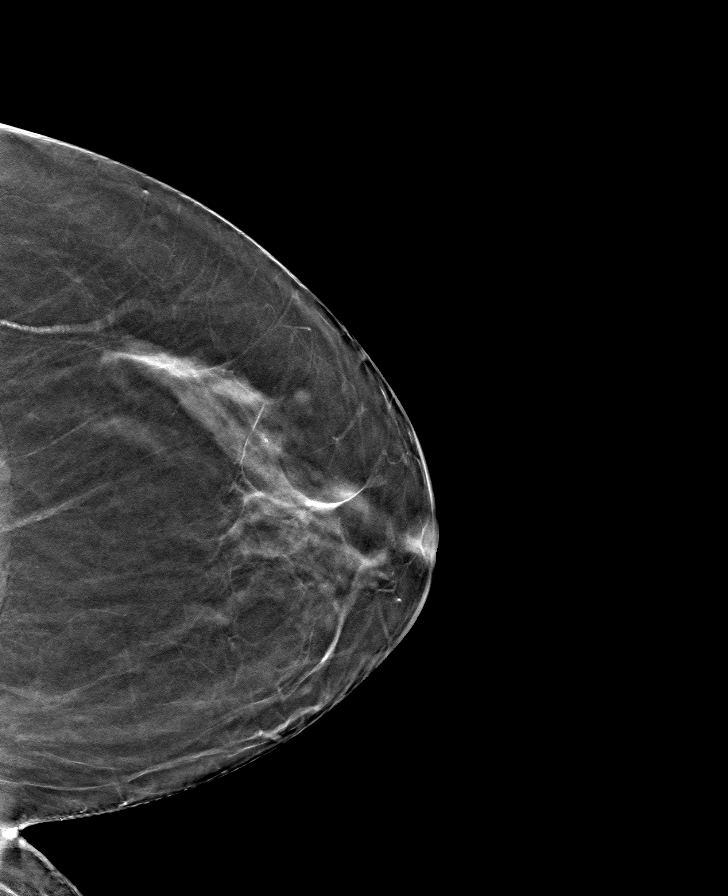

[8 of 24 positions shown; findings below may reference images not displayed]

ACR Breast Density Category b: There are scattered areas of
fibroglandular density.
FINDINGS: In the left breast, a possible mass warrants further evaluation. In
the right breast, no findings suspicious for malignancy. Images were
processed with CAD.
IMPRESSION: Further evaluation is suggested for possible mass in the left
breast.

RECOMMENDATION:
Ultrasound of the left breast. (Code:[F9])

The patient will be contacted regarding the findings, and additional
imaging will be scheduled.

BI-RADS CATEGORY  0: Incomplete. Need additional imaging evaluation
and/or prior mammograms for comparison.

## 2019-08-07 DIAGNOSIS — M546 Pain in thoracic spine: Secondary | ICD-10-CM | POA: Diagnosis not present

## 2019-08-07 DIAGNOSIS — M531 Cervicobrachial syndrome: Secondary | ICD-10-CM | POA: Diagnosis not present

## 2019-08-07 DIAGNOSIS — M9902 Segmental and somatic dysfunction of thoracic region: Secondary | ICD-10-CM | POA: Diagnosis not present

## 2019-08-07 DIAGNOSIS — M9901 Segmental and somatic dysfunction of cervical region: Secondary | ICD-10-CM | POA: Diagnosis not present

## 2019-08-08 ENCOUNTER — Ambulatory Visit
Admission: RE | Admit: 2019-08-08 | Discharge: 2019-08-08 | Disposition: A | Discharge status: Home / Self Care | Payer: Medicare Other | Source: Ambulatory Visit | Attending: Family Medicine | Admitting: Family Medicine

## 2019-08-08 ENCOUNTER — Ambulatory Visit: Payer: Medicare Other | Admitting: Obstetrics & Gynecology

## 2019-08-08 DIAGNOSIS — R928 Other abnormal and inconclusive findings on diagnostic imaging of breast: Secondary | ICD-10-CM | POA: Diagnosis not present

## 2019-08-08 DIAGNOSIS — N6012 Diffuse cystic mastopathy of left breast: Secondary | ICD-10-CM | POA: Diagnosis not present

## 2019-08-08 DIAGNOSIS — N6320 Unspecified lump in the left breast, unspecified quadrant: Secondary | ICD-10-CM | POA: Diagnosis not present

## 2019-08-08 IMAGING — US US BREAST*L* LIMITED INC AXILLA
1 series · 6 of 6 positions shown · non-contrast
Comparison: Previous exam(s).

CLINICAL DATA: Ultrasound only screening recall for a 3 mm oval
circumscribed mass within the slightly upper outer left breast.

EXAM:
ULTRASOUND OF THE LEFT BREAST

[Series 1: us breast*left* limited inc axilla · 0.06mm/px · 6 of 6 slices shown]
[im 1/6]
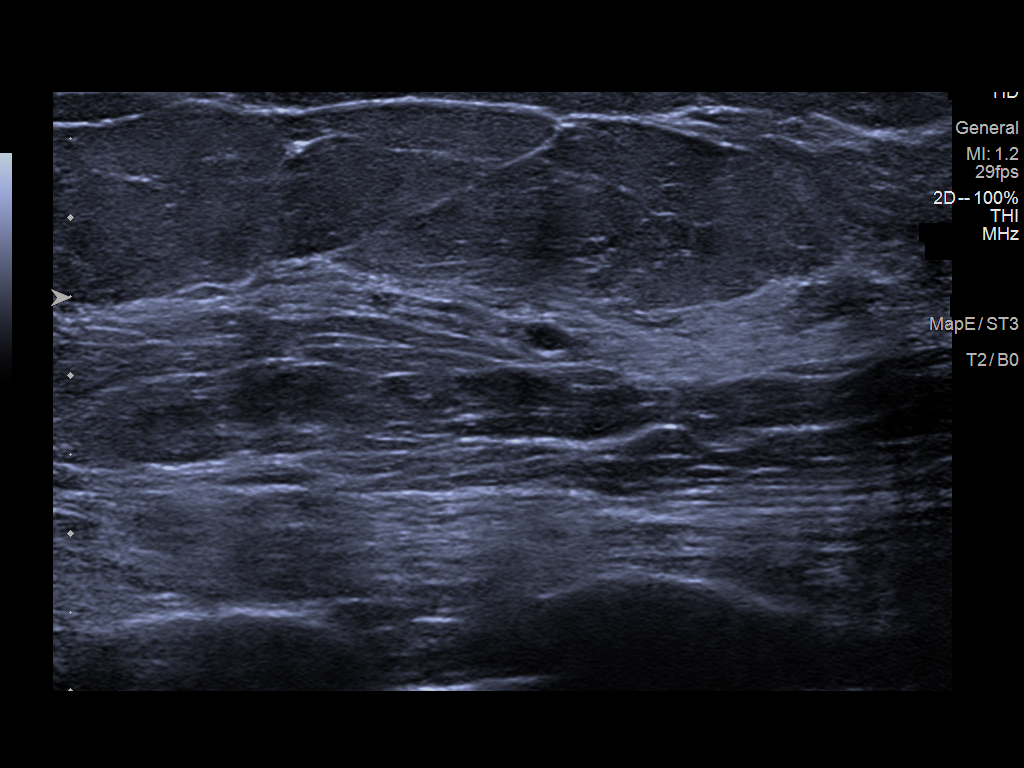
[im 2/6]
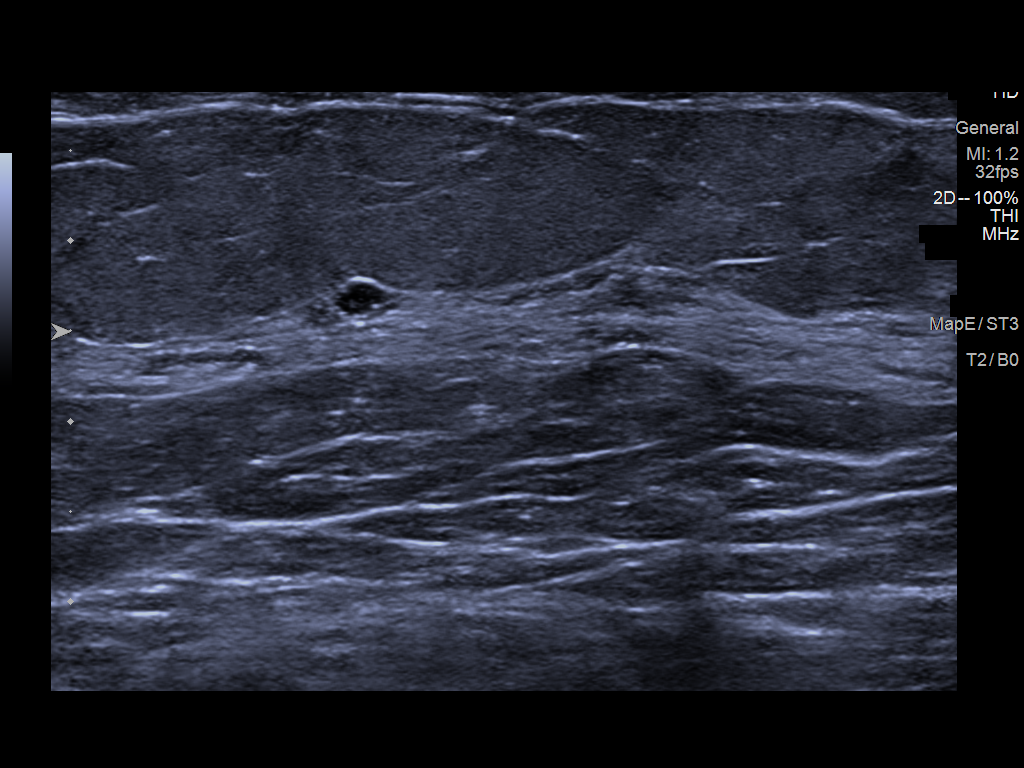
[im 3/6]
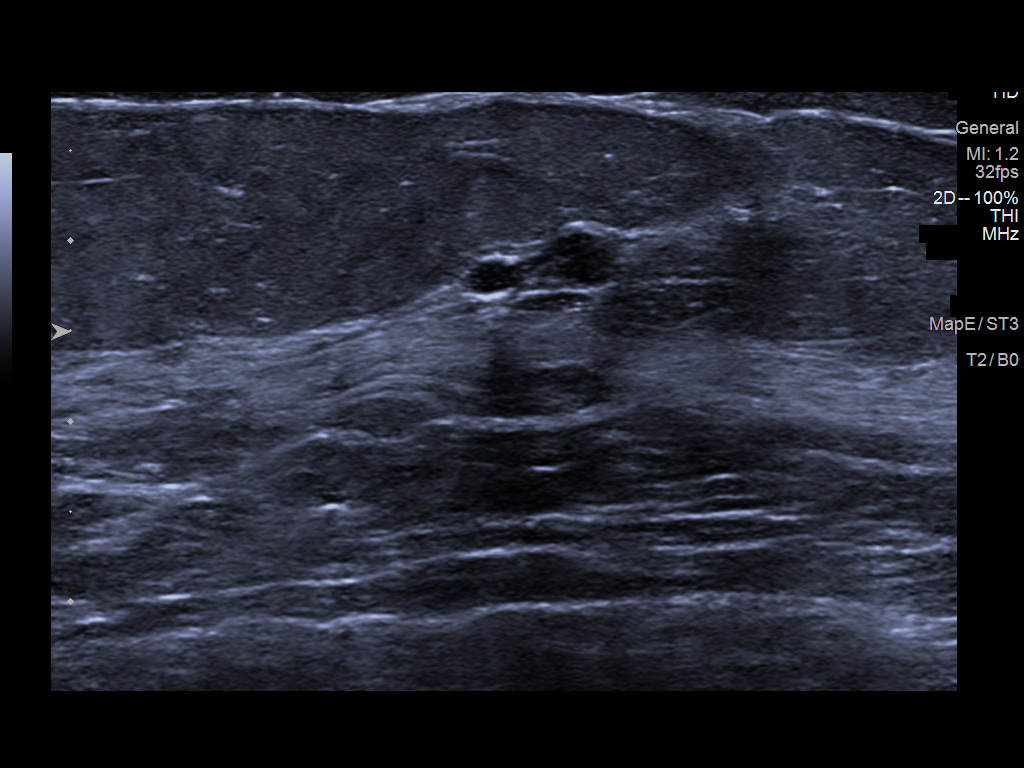
[im 4/6]
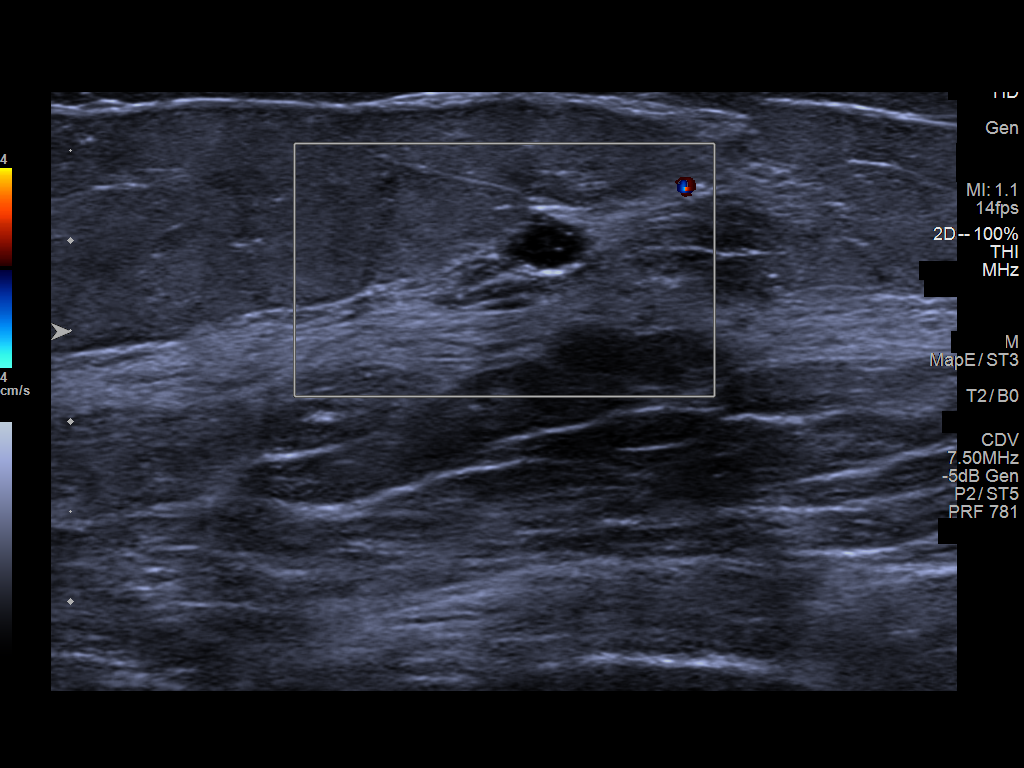
[im 5/6]
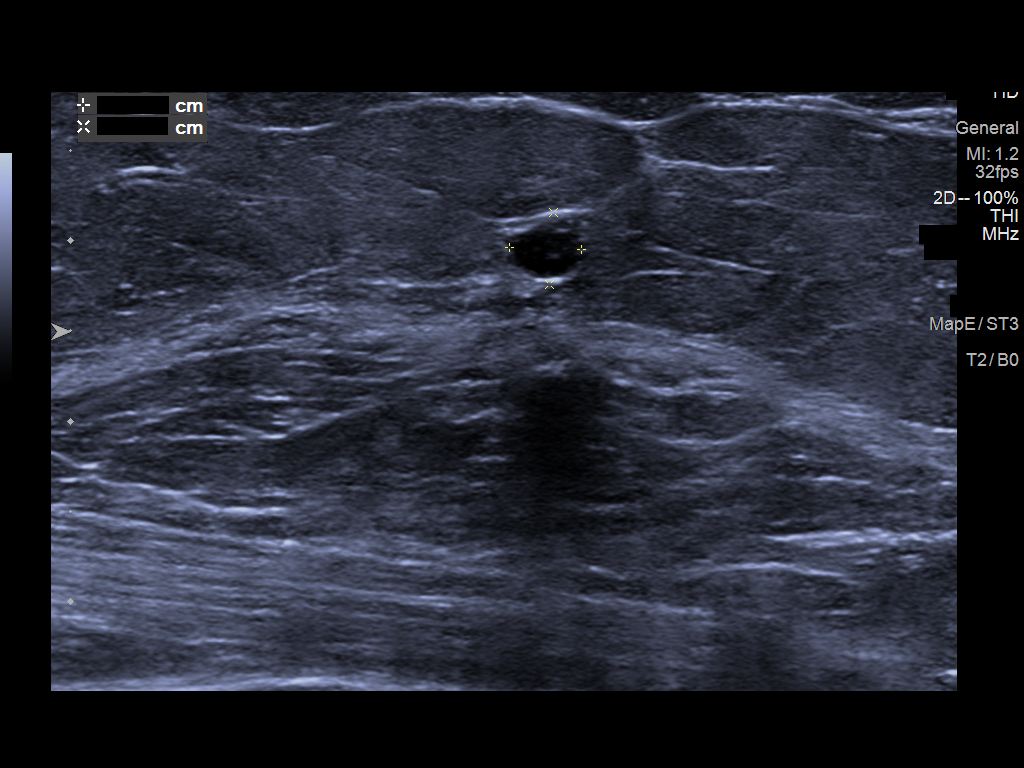
[im 6/6]
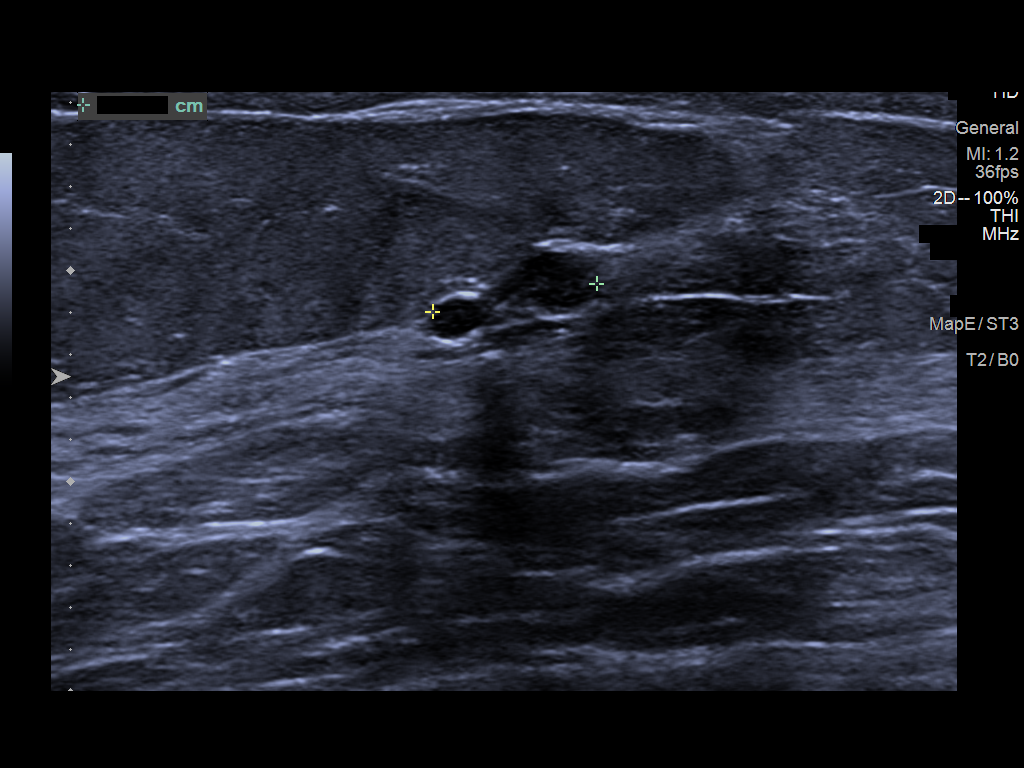

[6 of 6 positions shown; findings below may reference images not displayed]

FINDINGS: Targeted ultrasound of the outer left breast was performed. Several
small cysts are identified, with a cluster versus 2 adjacent cysts
at 2 o'clock 3 cm from nipple together measuring 0.4 x 0.4 x 0.8 cm.
This is felt to correspond well with the mass seen in the outer left
breast at mammography.
IMPRESSION: Left breast cysts.  No findings of malignancy in the left breast.

RECOMMENDATION:
Screening mammogram in one year.(Code:[BF])

I have discussed the findings and recommendations with the patient.
If applicable, a reminder letter will be sent to the patient
regarding the next appointment.

BI-RADS CATEGORY  2: Benign.

## 2019-08-16 NOTE — Progress Notes (Signed)
Gynecologic Oncology Return Clinic Visit  08/20/19  Reason for Visit: Follow-up periclitoral Paget's lesion  Treatment History: The patient previously had a supracervical hysterectomy but has her ovaries and cervix in situ. Surgery for Paget's disease of the vulva in October 2011.  There was focal involvement of the medial margin.  The entire lesion is approximately 5 cm in diameter. She then underwent wide local excision of a recurrent Paget's lesion in August 2016.  She had surgical margins that were focally positive and subsequently underwent another excision on 04/10/2015 with negative margins. She had a vulvar biopsy from the right in November 2017 that was negative.  She was seen for a visible lesion and vulvar symptoms in April 2020 and a biopsy of this lesion was consistent with Paget's disease. On 11/07/2018, she underwent partial left vulvectomy with findings of a 1 cm white hyperkeratotic lesion on the left labia minora close to the clitoris.  Margins were positive.  Plan was for consideration of Aldara since repeat excision could involve removal of the clitoris. Patient was seen on 01/02/2019 with a slightly erythematous area on the right vulva.  Plan was for repeat examination in September with biopsy if persistence of the lesion. On 9/20, patient underwent right vulvar biopsy which revealed extramammary Paget's disease.  After discussion of treatment options, the patient opted for Aldara use. She was seen in early December, at which time her right vulvar lesion had completely resolved.  At this visit, a small right periclitoral lesion was noted.  Given her excellent response to the previous lesion, her decision was to proceed with Aldara use for this new lesion. Saw me on 07/23/19 with a right peri-clitoral lesion suspected to be side effect from Aldara cream use.  Interval History: Overall, patient reports she has been doing well since her last visit with me.  She continues to have  some pruritus but this is overall much improved.  She denies any vaginal bleeding or discharge.  She denies any new symptoms.  She reports regular bowel and bladder function.  Past Medical/Surgical History: Past Medical History:  Diagnosis Date  . Allergic rhinitis   . Arthritis   . Asthma    mild no inaler use  . GERD (gastroesophageal reflux disease)   . Headache(784.0)    Migraines  . History of adenomatous polyp of colon   . History of concussion    AS CHILD--  NO RESIDUAL  . History of palpitations   . Hypertension   . Paget's disease of vulva   . PONV (postoperative nausea and vomiting)    SEVERE  . Wears glasses     Past Surgical History:  Procedure Laterality Date  . mucoid cyst removal thumb    . 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 PULLEY RELEASE RIGHT THUMB  03-23-2011  . SIMPLE VULVECTOMY  03-24-2010   right side  . TONSILLECTOMY  1977  . TRIGGER FINGER RELEASE    . VAGINAL HYSTERECTOMY  1989   and Anterior and posterior repair's for prolapse partial  . VULVECTOMY Right 01/10/2015   Procedure: RIGHT WIDE LOCAL EXCISION VULVECTOMY;  Surgeon: [name], MD;  Location: [place];  Service: Gynecology;  Laterality: Right;  Marland Kitchen VULVECTOMY Right 04/10/2015   Procedure: WIDE LOCAL EXCISION VULVA;  Surgeon: [name], MD;  Location: [facility];  Service: Gynecology;  Laterality: Right;  Marland Kitchen VULVECTOMY PARTIAL N/A 11/07/2018  Procedure: VULVECTOMY PARTIAL;  Surgeon: [name], MD;  Location: [facility];  Service: Gynecology;  Laterality: N/A;  . WISDOM TOOTH EXTRACTION      Family History  Problem Relation Age of Onset  . Cancer Maternal Grandmother   . Hypertension Father   . Heart disease Father   . Asthma Father   . Hypertension Mother   . Heart disease Mother   . Breast cancer Paternal Grandmother 75  .  Colon cancer Paternal Grandmother   . Colon cancer Paternal Uncle   . Allergic rhinitis Neg Hx   . Angioedema Neg Hx   . Eczema Neg Hx   . Immunodeficiency Neg Hx   . Urticaria Neg Hx   . Esophageal cancer Neg Hx   . Rectal cancer Neg Hx   . Stomach cancer Neg Hx     Social History   Socioeconomic History  . Marital status: Married    Spouse name: Not on file  . Number of children: Not on file  . Years of education: Not on file  . Highest education level: Not on file  Occupational History  . 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 Smoking status: Former Smoker    Years: 5.00    Types: Cigarettes    Quit date: 04/15/1989    Years since quitting: 30.3  . Smokeless tobacco: Never Used  Substance and Sexual Activity  . Alcohol use: Yes    Alcohol/week: 1.0 standard drinks    Types: 1 Glasses of wine per week    Comment: occasional wine  . Drug use: No  . Sexual activity: Not on file  Other Topics Concern  . Not on file  Social History Narrative   She is married.  She is a Forensic psychologist at [employer]   Occasional wine, former smoker not now no substances   Social Determinants of Health   Financial Resource Strain: Low Risk   . Difficulty of Paying Living Expenses: Not hard at all  Food Insecurity: No Food Insecurity  . Worried About Charity fundraiser in the Last Year: Never true  . Ran Out of Food in the Last Year: Never true  Transportation Needs: No Transportation Needs  . Lack of Transportation (Medical): No  . Lack of Transportation (Non-Medical): No  Physical Activity: Inactive  . Days of Exercise per Week: 0 days  . Minutes of Exercise per Session: 0 min  Stress: No Stress Concern Present  . Feeling of Stress : Not at all  Social Connections:   . Frequency of Communication with Friends and Family:   . Frequency of Social Gatherings with Friends and Family:   . Attends Religious Services:   . Active Member of Clubs or Organizations:   .  Attends Archivist Meetings:   Marland Kitchen Marital Status:     Current Medications:  Current Outpatient Medications:  .  cyclobenzaprine (FLEXERIL) 5 MG tablet, TAKE 1 TO 2 TABLETS(5 TO 10 MG) BY MOUTH TWICE DAILY AS NEEDED FOR MUSCLE SPASMS, Disp: 40 tablet, Rfl: 0 .  diphenhydrAMINE (BENADRYL) 25 MG tablet, Take 50 mg by mouth at bedtime as needed for itching. 2 tablets twice daily , Disp: , Rfl:  .  famotidine (PEPCID) 40 MG tablet, TAKE 1/2 TABLET BY MOUTH TWICE DAILY, Disp: 90 tablet, Rfl: 3 .  imiquimod (ALDARA) 5 % cream, Apply topically 3 (three) times a week. Apply to right vulva 3 times a week, use for 12 weeks,  Disp: 12 each, Rfl: 1 .  lansoprazole (PREVACID) 15 MG capsule, Take 1 capsule (15 mg total) by mouth 2 (two) times daily before a meal., Disp: , Rfl:  .  losartan-hydrochlorothiazide (HYZAAR) 100-25 MG tablet, TK 1 T PO QD, Disp: 90 tablet, Rfl: 3 .  metoprolol tartrate (LOPRESSOR) 25 MG tablet, Take 0.5 tablets (12.5 mg total) by mouth 2 (two) times daily., Disp: 90 tablet, Rfl: 3 .  montelukast (SINGULAIR) 10 MG tablet, TAKE 1 TABLET(10 MG) BY MOUTH EVERY MORNING, Disp: 90 tablet, Rfl: 3 .  Triamcinolone & Emollient (DERMASORB TA) 0.1 % KIT, , Disp: , Rfl: 0 .  triamcinolone ointment (KENALOG) 0.5 %, Apply 1 application topically 2 (two) times daily., Disp: 30 g, Rfl: 1  Review of Systems: Denies appetite changes, fevers, chills, fatigue, unexplained weight changes. Denies hearing loss, neck lumps or masses, mouth sores, ringing in ears or voice changes. Denies cough or wheezing.  Denies shortness of breath. Denies chest pain or palpitations. Denies leg swelling. Denies abdominal distention, pain, blood in stools, constipation, diarrhea, nausea, vomiting, or early satiety. Denies pain with intercourse, dysuria, frequency, hematuria or incontinence. Denies hot flashes, pelvic pain, vaginal bleeding or vaginal discharge.   Denies joint pain, back pain or muscle  pain/cramps. Denies itching, rash, or wounds. Denies dizziness, headaches, numbness or seizures. Denies swollen lymph nodes or glands, denies easy bruising or bleeding. Denies anxiety, depression, confusion, or decreased concentration.  Physical Exam: BP 136/72 (BP Location: Left Arm, Patient Position: Sitting)   Pulse (!) 55   Temp 97.8 F (36.6 C) (Temporal)   Ht '5\' 4"'$  (1.626 m)   Wt 187 lb (84.8 kg)   SpO2 98%   BMI 32.10 kg/m  General: Alert, oriented, no acute distress. HEENT: Atraumatic, normocephalic, sclera anicteric. Chest: Unlabored breathing on room air. Extremities: Grossly normal range of motion.  Warm, well perfused.  No edema bilaterally. Skin: No rashes or lesions noted. GU: The previously seen ulcerated lesion along the labia minora and labia majora fold on the right has almost completely healed.  There is a less than 5 mm skin separation still at the inferior border at no associated erythema.  Just superior to this lesion is some very mild erythematous changes.  No tenderness to palpation over either area  Laboratory & Radiologic Studies: None new  Assessment & Plan: RAYLEE STREHL is a 69 y.o. woman with history of recurrent Paget's disease of the vulva.  On exam today, the previously ulcerated lesion has almost completely healed.  The patient continues to have some mild pruritus that is improved and it is difficult to tell if this is related to the lesion that I am concerned may be ongoing Paget's versus continued healing of the ulcerated lesion after Aldara use.  The patient is going to refrain from any product use for another 1 to 2 weeks and then will either call the office or send me a MyChart message.  If her pruritus completely resolves, then I would favor no further Aldara cream use and follow-up with an exam in 3 months.  She continues to have pruritus even after the small skin opening has closed, then I think we will either need to discuss biopsy of this area  to prove that it is Paget's versus another trial of Aldara cream.  She and I talked about ways to reduce having the cream on surrounding surfaces such as trimming hair or shaving.  20 minutes of total time was spent for this patient  encounter, including preparation, face-to-face counseling with the patient and coordination of care, and documentation of the encounter.  Jeral Pinch, MD  Division of Gynecologic Oncology  Department of Obstetrics and Gynecology  [facility]

## 2019-08-20 ENCOUNTER — Inpatient Hospital Stay: Payer: Medicare Other | Attending: Gynecologic Oncology | Admitting: Gynecologic Oncology

## 2019-08-20 ENCOUNTER — Other Ambulatory Visit: Payer: Self-pay

## 2019-08-20 ENCOUNTER — Encounter: Payer: Self-pay | Admitting: Gynecologic Oncology

## 2019-08-20 VITALS — BP 136/72 | HR 55 | Temp 97.8°F | Ht 64.0 in | Wt 187.0 lb

## 2019-08-20 DIAGNOSIS — I10 Essential (primary) hypertension: Secondary | ICD-10-CM

## 2019-08-20 DIAGNOSIS — Z800 Family history of malignant neoplasm of digestive organs: Secondary | ICD-10-CM

## 2019-08-20 DIAGNOSIS — Z9079 Acquired absence of other genital organ(s): Secondary | ICD-10-CM | POA: Insufficient documentation

## 2019-08-20 DIAGNOSIS — Z87891 Personal history of nicotine dependence: Secondary | ICD-10-CM | POA: Diagnosis not present

## 2019-08-20 DIAGNOSIS — Z79899 Other long term (current) drug therapy: Secondary | ICD-10-CM | POA: Diagnosis not present

## 2019-08-20 DIAGNOSIS — C4499 Other specified malignant neoplasm of skin, unspecified: Secondary | ICD-10-CM

## 2019-08-20 DIAGNOSIS — C519 Malignant neoplasm of vulva, unspecified: Secondary | ICD-10-CM | POA: Diagnosis not present

## 2019-08-20 DIAGNOSIS — J45909 Unspecified asthma, uncomplicated: Secondary | ICD-10-CM

## 2019-08-20 DIAGNOSIS — Z803 Family history of malignant neoplasm of breast: Secondary | ICD-10-CM | POA: Insufficient documentation

## 2019-08-20 DIAGNOSIS — Z8249 Family history of ischemic heart disease and other diseases of the circulatory system: Secondary | ICD-10-CM

## 2019-08-20 NOTE — Patient Instructions (Signed)
You are overall healing well, but there is still a small area of skin separation.  In the next week or 2, please let me know either by phone call or on MyChart message if you continue to have itching even after that skin has healed shot.  If you have, then we will decide about another course of Aldara.

## 2019-08-22 DIAGNOSIS — M531 Cervicobrachial syndrome: Secondary | ICD-10-CM | POA: Diagnosis not present

## 2019-08-22 DIAGNOSIS — M546 Pain in thoracic spine: Secondary | ICD-10-CM | POA: Diagnosis not present

## 2019-08-22 DIAGNOSIS — M9902 Segmental and somatic dysfunction of thoracic region: Secondary | ICD-10-CM | POA: Diagnosis not present

## 2019-08-22 DIAGNOSIS — M9901 Segmental and somatic dysfunction of cervical region: Secondary | ICD-10-CM | POA: Diagnosis not present

## 2019-09-03 DIAGNOSIS — H04123 Dry eye syndrome of bilateral lacrimal glands: Secondary | ICD-10-CM | POA: Diagnosis not present

## 2019-09-03 DIAGNOSIS — M1812 Unilateral primary osteoarthritis of first carpometacarpal joint, left hand: Secondary | ICD-10-CM | POA: Diagnosis not present

## 2019-09-05 DIAGNOSIS — M546 Pain in thoracic spine: Secondary | ICD-10-CM | POA: Diagnosis not present

## 2019-09-05 DIAGNOSIS — M9902 Segmental and somatic dysfunction of thoracic region: Secondary | ICD-10-CM | POA: Diagnosis not present

## 2019-09-05 DIAGNOSIS — M9901 Segmental and somatic dysfunction of cervical region: Secondary | ICD-10-CM | POA: Diagnosis not present

## 2019-09-05 DIAGNOSIS — M531 Cervicobrachial syndrome: Secondary | ICD-10-CM | POA: Diagnosis not present

## 2019-09-21 DIAGNOSIS — L820 Inflamed seborrheic keratosis: Secondary | ICD-10-CM | POA: Diagnosis not present

## 2019-09-24 DIAGNOSIS — M9901 Segmental and somatic dysfunction of cervical region: Secondary | ICD-10-CM | POA: Diagnosis not present

## 2019-09-24 DIAGNOSIS — M531 Cervicobrachial syndrome: Secondary | ICD-10-CM | POA: Diagnosis not present

## 2019-09-24 DIAGNOSIS — M546 Pain in thoracic spine: Secondary | ICD-10-CM | POA: Diagnosis not present

## 2019-09-24 DIAGNOSIS — M9902 Segmental and somatic dysfunction of thoracic region: Secondary | ICD-10-CM | POA: Diagnosis not present

## 2019-10-12 ENCOUNTER — Encounter: Payer: Self-pay | Admitting: Gynecologic Oncology

## 2019-10-22 DIAGNOSIS — M546 Pain in thoracic spine: Secondary | ICD-10-CM | POA: Diagnosis not present

## 2019-10-22 DIAGNOSIS — M531 Cervicobrachial syndrome: Secondary | ICD-10-CM | POA: Diagnosis not present

## 2019-10-22 DIAGNOSIS — M9901 Segmental and somatic dysfunction of cervical region: Secondary | ICD-10-CM | POA: Diagnosis not present

## 2019-10-22 DIAGNOSIS — M9902 Segmental and somatic dysfunction of thoracic region: Secondary | ICD-10-CM | POA: Diagnosis not present

## 2019-10-30 NOTE — Telephone Encounter (Signed)
Ms [name] said that the itching has resolved and she is very pleased.  She will keep appointment on 11-22-19 with Dr. Berline Lopes as scheduled.

## 2019-11-13 DIAGNOSIS — M9902 Segmental and somatic dysfunction of thoracic region: Secondary | ICD-10-CM | POA: Diagnosis not present

## 2019-11-13 DIAGNOSIS — M546 Pain in thoracic spine: Secondary | ICD-10-CM | POA: Diagnosis not present

## 2019-11-13 DIAGNOSIS — M531 Cervicobrachial syndrome: Secondary | ICD-10-CM | POA: Diagnosis not present

## 2019-11-13 DIAGNOSIS — M9901 Segmental and somatic dysfunction of cervical region: Secondary | ICD-10-CM | POA: Diagnosis not present

## 2019-11-20 NOTE — Progress Notes (Deleted)
Gynecologic Oncology Return Clinic Visit  '@DATE'$ @  Reason for Visit: ***  Treatment History: The patient previously had a supracervical hysterectomy but has her ovaries and cervix in situ. Surgery for Paget's disease of the vulva in October 2011. There was focal involvement of the medial margin. The entire lesion is approximately 5 cm in diameter. She then underwent wide local excision of a recurrent Paget's lesion in August 2016. She had surgical margins that were focally positive and subsequently underwentanother excision on 04/10/2015 with negative margins. UXNATFT vulvar biopsy from the right inNovember 2017 that was negative. She was seen for a visible lesion and vulvar symptoms in April 2020 and a biopsy of this lesion was consistent with Paget's disease. On 11/07/2018, she underwent partial left vulvectomy with findings of a 1 cm white hyperkeratotic lesion on the left labia minora close to the clitoris. Margins were positive. Plan was for consideration of Aldara since repeat excision could involve removal of the clitoris. Patient was seen on 01/02/2019 with a slightly erythematous area on the right vulva. Plan was for repeat examination in September with biopsy if persistence of the lesion. On 9/20, patient underwent right vulvar biopsy which revealed extramammary Paget's disease. After discussion of treatment options, the patient opted for Aldara use. She was seen in early December, at which time her right vulvar lesion had completely resolved. At this visit, a small right periclitoral lesion was noted. Given her excellent response to the previous lesion, her decision was to proceed with Aldara use for this new lesion. Saw me on 07/23/19 with a right peri-clitoral lesion suspected to be side effect from Aldara cream use. When I saw her again on 3/15, her ulcerated lesion had almost completely resolved and mild pruritus had significantly improved.   Interval History: ***  Past  Medical/Surgical History: Past Medical History:  Diagnosis Date  . Allergic rhinitis   . Arthritis   . Asthma    mild no inaler use  . GERD (gastroesophageal reflux disease)   . Headache(784.0)    Migraines  . History of adenomatous polyp of colon   . History of concussion    AS CHILD--  NO RESIDUAL  . History of palpitations   . Hypertension   . Paget's disease of vulva   . PONV (postoperative nausea and vomiting)    SEVERE  . Wears glasses     Past Surgical History:  Procedure Laterality Date  . mucoid cyst removal thumb    . 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 PULLEY RELEASE RIGHT THUMB  03-23-2011  . SIMPLE VULVECTOMY  03-24-2010   right side  . TONSILLECTOMY  1977  . TRIGGER FINGER RELEASE    . VAGINAL HYSTERECTOMY  1989   and Anterior and posterior repair's for prolapse partial  . VULVECTOMY Right 01/10/2015   Procedure: RIGHT WIDE LOCAL EXCISION VULVECTOMY;  Surgeon: [name], MD;  Location: [place];  Service: Gynecology;  Laterality: Right;  Marland Kitchen VULVECTOMY Right 04/10/2015   Procedure: WIDE LOCAL EXCISION VULVA;  Surgeon: [name], MD;  Location: [facility];  Service: Gynecology;  Laterality: Right;  Marland Kitchen VULVECTOMY PARTIAL N/A 11/07/2018   Procedure: VULVECTOMY PARTIAL;  Surgeon: [name], MD;  Location: [facility];  Service: Gynecology;  Laterality: N/A;  . WISDOM TOOTH EXTRACTION      Family History  Problem Relation Age of Onset  . Cancer Maternal Grandmother   .  Hypertension Father   . Heart disease Father   . Asthma Father   . Hypertension Mother   . Heart disease Mother   . Breast cancer Paternal Grandmother 78  . Colon cancer Paternal Grandmother   . Colon cancer Paternal Uncle   . Allergic rhinitis Neg Hx   . Angioedema Neg Hx   . Eczema Neg Hx   . Immunodeficiency Neg Hx   . Urticaria  Neg Hx   . Esophageal cancer Neg Hx   . Rectal cancer Neg Hx   . Stomach cancer Neg Hx     Social History   Socioeconomic History  . Marital status: Married    Spouse name: Not on file  . Number of children: Not on file  . Years of education: Not on file  . Highest education level: Not on file  Occupational History  . 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 Smoking status: Former Smoker    Years: 5.00    Types: Cigarettes    Quit date: 04/15/1989    Years since quitting: 30.6  . Smokeless tobacco: Never Used  Vaping Use  . Vaping Use: Never used  Substance and Sexual Activity  . Alcohol use: Yes    Alcohol/week: 1.0 standard drink    Types: 1 Glasses of wine per week    Comment: occasional wine  . Drug use: No  . Sexual activity: Not on file  Other Topics Concern  . Not on file  Social History Narrative   She is married.  She is a Forensic psychologist at [employer]   Occasional wine, former smoker not now no substances   Social Determinants of Health   Financial Resource Strain: Low Risk   . Difficulty of Paying Living Expenses: Not hard at all  Food Insecurity: No Food Insecurity  . Worried About Charity fundraiser in the Last Year: Never true  . Ran Out of Food in the Last Year: Never true  Transportation Needs: No Transportation Needs  . Lack of Transportation (Medical): No  . Lack of Transportation (Non-Medical): No  Physical Activity: Inactive  . Days of Exercise per Week: 0 days  . Minutes of Exercise per Session: 0 min  Stress: No Stress Concern Present  . Feeling of Stress : Not at all  Social Connections:   . Frequency of Communication with Friends and Family:   . Frequency of Social Gatherings with Friends and Family:   . Attends Religious Services:   . Active Member of Clubs or Organizations:   . Attends Archivist Meetings:   Marland Kitchen Marital Status:     Current Medications:  Current Outpatient Medications:  .  cyclobenzaprine  (FLEXERIL) 5 MG tablet, TAKE 1 TO 2 TABLETS(5 TO 10 MG) BY MOUTH TWICE DAILY AS NEEDED FOR MUSCLE SPASMS, Disp: 40 tablet, Rfl: 0 .  diphenhydrAMINE (BENADRYL) 25 MG tablet, Take 50 mg by mouth at bedtime as needed for itching. 2 tablets twice daily , Disp: , Rfl:  .  famotidine (PEPCID) 40 MG tablet, TAKE 1/2 TABLET BY MOUTH TWICE DAILY, Disp: 90 tablet, Rfl: 3 .  imiquimod (ALDARA) 5 % cream, Apply topically 3 (three) times a week. Apply to right vulva 3 times a week, use for 12 weeks, Disp: 12 each, Rfl: 1 .  lansoprazole (PREVACID) 15 MG capsule, Take 1 capsule (15 mg total) by mouth 2 (two) times daily before a meal., Disp: , Rfl:  .  losartan-hydrochlorothiazide (HYZAAR) 100-25 MG  tablet, TK 1 T PO QD, Disp: 90 tablet, Rfl: 3 .  metoprolol tartrate (LOPRESSOR) 25 MG tablet, Take 0.5 tablets (12.5 mg total) by mouth 2 (two) times daily., Disp: 90 tablet, Rfl: 3 .  montelukast (SINGULAIR) 10 MG tablet, TAKE 1 TABLET(10 MG) BY MOUTH EVERY MORNING, Disp: 90 tablet, Rfl: 3 .  Triamcinolone & Emollient (DERMASORB TA) 0.1 % KIT, , Disp: , Rfl: 0 .  triamcinolone ointment (KENALOG) 0.5 %, Apply 1 application topically 2 (two) times daily., Disp: 30 g, Rfl: 1  Review of Systems: Denies appetite changes, fevers, chills, fatigue, unexplained weight changes. Denies hearing loss, neck lumps or masses, mouth sores, ringing in ears or voice changes. Denies cough or wheezing.  Denies shortness of breath. Denies chest pain or palpitations. Denies leg swelling. Denies abdominal distention, pain, blood in stools, constipation, diarrhea, nausea, vomiting, or early satiety. Denies pain with intercourse, dysuria, frequency, hematuria or incontinence. Denies hot flashes, pelvic pain, vaginal bleeding or vaginal discharge.   Denies joint pain, back pain or muscle pain/cramps. Denies itching, rash, or wounds. Denies dizziness, headaches, numbness or seizures. Denies swollen lymph nodes or glands, denies easy  bruising or bleeding. Denies anxiety, depression, confusion, or decreased concentration.  Physical Exam: There were no vitals taken for this visit. General: ***Alert, oriented, no acute distress. HEENT: ***Posterior oropharynx clear, sclera anicteric. Chest: ***Clear to auscultation bilaterally.  ***Port site clean. Cardiovascular: ***Regular rate and rhythm, no murmurs. Abdomen: ***Obese, soft, nontender.  Normoactive bowel sounds.  No masses or hepatosplenomegaly appreciated.  ***Well-healed scar. Extremities: ***Grossly normal range of motion.  Warm, well perfused.  No edema bilaterally. Skin: ***No rashes or lesions noted. Lymphatics: ***No cervical, supraclavicular, or inguinal adenopathy. GU: Normal appearing external genitalia without erythema, excoriation, or lesions.  Speculum exam reveals ***.  Bimanual exam reveals ***.  ***Rectovaginal exam  confirms ___.  Laboratory & Radiologic Studies: ***  Assessment & Plan: BRUNELLA WILEMAN is a 69 y.o. woman with Stage *** who presents for ***.  ***  *** minutes of total time was spent for this patient encounter, including preparation, face-to-face counseling with the patient and coordination of care, and documentation of the encounter.  Jeral Pinch, MD  Division of Gynecologic Oncology  Department of Obstetrics and Gynecology  [facility]

## 2019-11-21 ENCOUNTER — Telehealth: Payer: Self-pay | Admitting: *Deleted

## 2019-11-21 NOTE — Telephone Encounter (Signed)
Patient called and rescheduled her appt from tomorrow to Friday afternoon

## 2019-11-22 ENCOUNTER — Inpatient Hospital Stay: Payer: Medicare Other | Admitting: Gynecologic Oncology

## 2019-11-22 NOTE — Progress Notes (Signed)
Gynecologic Oncology Return Clinic Visit  11/23/2019  Reason for Visit: Follow-up visit in the setting of Paget's disease of the vulva  Treatment History: The patient previously had a supracervical hysterectomy but has her ovaries and cervix in situ. Surgery for Paget's disease of the vulva in October 2011. There was focal involvement of the medial margin. The entire lesion is approximately 5 cm in diameter. She then underwent wide local excision of a recurrent Paget's lesion in August 2016. She had surgical margins that were focally positive and subsequently underwentanother excision on 04/10/2015 with negative margins. NUUVOZD vulvar biopsy from the right inNovember 2017 that was negative. She was seen for a visible lesion and vulvar symptoms in April 2020 and a biopsy of this lesion was consistent with Paget's disease. On 11/07/2018, she underwent partial left vulvectomy with findings of a 1 cm white hyperkeratotic lesion on the left labia minora close to the clitoris. Margins were positive. Plan was for consideration of Aldara since repeat excision could involve removal of the clitoris. Patient was seen on 01/02/2019 with a slightly erythematous area on the right vulva. Plan was for repeat examination in September with biopsy if persistence of the lesion. On 9/20, patient underwent right vulvar biopsy which revealed extramammary Paget's disease. After discussion of treatment options, the patient opted for Aldara use. She was seen in early December, at which time her right vulvar lesion had completely resolved. At this visit, a small right periclitoral lesion was noted. Given her excellent response to the previous lesion, her decision was to proceed with Aldara use for this new lesion. Saw me on 07/23/19 with a right peri-clitoral lesion suspected to be side effect from Aldara cream use.  Interval History: Patient reports overall doing well since her last visit with me.  She has not  been using any topical cream on her vulva.  She endorses very occasional pruritus but otherwise denies any vaginal symptoms including burning, pain, pruritus, vaginal bleeding or discharge.  The raw area that she had previously noted is gone.  She has what she feels is a pimple on her right upper labia.  Past Medical/Surgical History: Past Medical History:  Diagnosis Date  . Allergic rhinitis   . Arthritis   . Asthma    mild no inaler use  . GERD (gastroesophageal reflux disease)   . Headache(784.0)    Migraines  . History of adenomatous polyp of colon   . History of concussion    AS CHILD--  NO RESIDUAL  . History of palpitations   . Hypertension   . Paget's disease of vulva   . PONV (postoperative nausea and vomiting)    SEVERE  . Wears glasses     Past Surgical History:  Procedure Laterality Date  . mucoid cyst removal thumb    . 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 PULLEY RELEASE RIGHT THUMB  03-23-2011  . SIMPLE VULVECTOMY  03-24-2010   right side  . TONSILLECTOMY  1977  . TRIGGER FINGER RELEASE    . VAGINAL HYSTERECTOMY  1989   and Anterior and posterior repair's for prolapse partial  . VULVECTOMY Right 01/10/2015   Procedure: RIGHT WIDE LOCAL EXCISION VULVECTOMY;  Surgeon: [name], MD;  Location: [place];  Service: Gynecology;  Laterality: Right;  Marland Kitchen VULVECTOMY Right 04/10/2015   Procedure: WIDE LOCAL EXCISION VULVA;  Surgeon: [name], MD;  Location: [facility];  Service: Gynecology;  Laterality: Right;  Marland Kitchen VULVECTOMY PARTIAL N/A 11/07/2018   Procedure: VULVECTOMY PARTIAL;  Surgeon: [name], MD;  Location: [facility];  Service: Gynecology;  Laterality: N/A;  . WISDOM TOOTH EXTRACTION      Family History  Problem Relation Age of Onset  . Cancer Maternal Grandmother   . Hypertension Father   . Heart  disease Father   . Asthma Father   . Hypertension Mother   . Heart disease Mother   . Breast cancer Paternal Grandmother 49  . Colon cancer Paternal Grandmother   . Colon cancer Paternal Uncle   . Allergic rhinitis Neg Hx   . Angioedema Neg Hx   . Eczema Neg Hx   . Immunodeficiency Neg Hx   . Urticaria Neg Hx   . Esophageal cancer Neg Hx   . Rectal cancer Neg Hx   . Stomach cancer Neg Hx     Social History   Socioeconomic History  . Marital status: Married    Spouse name: Not on file  . Number of children: Not on file  . Years of education: Not on file  . Highest education level: Not on file  Occupational History  . 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 Smoking status: Former Smoker    Years: 5.00    Types: Cigarettes    Quit date: 04/15/1989    Years since quitting: 30.6  . Smokeless tobacco: Never Used  Vaping Use  . Vaping Use: Never used  Substance and Sexual Activity  . Alcohol use: Yes    Alcohol/week: 1.0 standard drink    Types: 1 Glasses of wine per week    Comment: occasional wine  . Drug use: No  . Sexual activity: Not on file  Other Topics Concern  . Not on file  Social History Narrative   She is married.  She is a Forensic psychologist at [employer]   Occasional wine, former smoker not now no substances   Social Determinants of Health   Financial Resource Strain: Low Risk   . Difficulty of Paying Living Expenses: Not hard at all  Food Insecurity: No Food Insecurity  . Worried About Charity fundraiser in the Last Year: Never true  . Ran Out of Food in the Last Year: Never true  Transportation Needs: No Transportation Needs  . Lack of Transportation (Medical): No  . Lack of Transportation (Non-Medical): No  Physical Activity: Inactive  . Days of Exercise per Week: 0 days  . Minutes of Exercise per Session: 0 min  Stress: No Stress Concern Present  . Feeling of Stress : Not at all  Social Connections:   . Frequency of Communication  with Friends and Family:   . Frequency of Social Gatherings with Friends and Family:   . Attends Religious Services:   . Active Member of Clubs or Organizations:   . Attends Archivist Meetings:   Marland Kitchen Marital Status:     Current Medications:  Current Outpatient Medications:  .  cyclobenzaprine (FLEXERIL) 5 MG tablet, TAKE 1 TO 2 TABLETS(5 TO 10 MG) BY MOUTH TWICE DAILY AS NEEDED FOR MUSCLE SPASMS, Disp: 40 tablet, Rfl: 0 .  diphenhydrAMINE (BENADRYL) 25 MG tablet, Take 50 mg by mouth at bedtime as needed for itching. 2 tablets twice daily , Disp: , Rfl:  .  famotidine (PEPCID) 40 MG tablet, TAKE 1/2 TABLET BY MOUTH TWICE DAILY, Disp: 90 tablet, Rfl: 3 .  imiquimod (ALDARA)  5 % cream, Apply topically 3 (three) times a week. Apply to right vulva 3 times a week, use for 12 weeks, Disp: 12 each, Rfl: 1 .  lansoprazole (PREVACID) 15 MG capsule, Take 1 capsule (15 mg total) by mouth 2 (two) times daily before a meal., Disp: , Rfl:  .  losartan-hydrochlorothiazide (HYZAAR) 100-25 MG tablet, TK 1 T PO QD, Disp: 90 tablet, Rfl: 3 .  metoprolol tartrate (LOPRESSOR) 25 MG tablet, Take 0.5 tablets (12.5 mg total) by mouth 2 (two) times daily., Disp: 90 tablet, Rfl: 3 .  montelukast (SINGULAIR) 10 MG tablet, TAKE 1 TABLET(10 MG) BY MOUTH EVERY MORNING, Disp: 90 tablet, Rfl: 3 .  Triamcinolone & Emollient (DERMASORB TA) 0.1 % KIT, , Disp: , Rfl: 0 .  triamcinolone ointment (KENALOG) 0.5 %, Apply 1 application topically 2 (two) times daily., Disp: 30 g, Rfl: 1  Review of Systems: + Right hand tingling Denies appetite changes, fevers, chills, fatigue, unexplained weight changes. Denies hearing loss, neck lumps or masses, mouth sores, ringing in ears or voice changes. Denies cough or wheezing.  Denies shortness of breath. Denies chest pain or palpitations. Denies leg swelling. Denies abdominal distention, pain, blood in stools, constipation, diarrhea, nausea, vomiting, or early satiety. Denies  pain with intercourse, dysuria, frequency, hematuria or incontinence. Denies hot flashes, pelvic pain, vaginal bleeding or vaginal discharge.   Denies joint pain, back pain or muscle pain/cramps. Denies itching, rash, or wounds. Denies dizziness, headaches, or seizures. Denies swollen lymph nodes or glands, denies easy bruising or bleeding. Denies anxiety, depression, confusion, or decreased concentration.  Physical Exam: BP 128/83 (BP Location: Left Arm, Patient Position: Sitting)   Pulse 69   Temp 98.5 F (36.9 C) (Oral)   Resp 16   Ht 5' 4"  (1.626 m)   Wt 188 lb (85.3 kg)   SpO2 99%   BMI 32.27 kg/m  General: Alert, oriented, no acute distress. HEENT: Normocephalic, atraumatic, sclera anicteric. Chest: Unlabored breathing on room air.  GU: Previously ulcerated lesion has completely healed, no erythema noted.  There is what appears to be tissue consistent with scar tissue just above the right apex of the labia.  No other areas concerning for recurrent Paget's disease noted.  No tenderness with palpation.  Laboratory & Radiologic Studies: None new  Assessment & Plan: [name] is a 69 y.o. woman with history of recurrent Paget's disease of the vulva.  Patient's vulva has healed well now off of Aldara.  She is almost completely asymptomatic and has no concerning lesions today.  We will plan to see her back in 3 months and discussed possible future use of Aldara if she has recurrence of her symptoms.  All questions answered today.  18 minutes of total time was spent for this patient encounter, including preparation, face-to-face counseling with the patient and coordination of care, and documentation of the encounter.  Jeral Pinch, MD  Division of Gynecologic Oncology  Department of Obstetrics and Gynecology  [facility]

## 2019-11-23 ENCOUNTER — Other Ambulatory Visit: Payer: Self-pay

## 2019-11-23 ENCOUNTER — Inpatient Hospital Stay: Payer: Medicare Other | Attending: Gynecologic Oncology | Admitting: Gynecologic Oncology

## 2019-11-23 ENCOUNTER — Encounter: Payer: Self-pay | Admitting: Gynecologic Oncology

## 2019-11-23 VITALS — BP 128/83 | HR 69 | Temp 98.5°F | Resp 16 | Ht 64.0 in | Wt 188.0 lb

## 2019-11-23 DIAGNOSIS — Z87891 Personal history of nicotine dependence: Secondary | ICD-10-CM | POA: Diagnosis not present

## 2019-11-23 DIAGNOSIS — K219 Gastro-esophageal reflux disease without esophagitis: Secondary | ICD-10-CM | POA: Insufficient documentation

## 2019-11-23 DIAGNOSIS — Z79899 Other long term (current) drug therapy: Secondary | ICD-10-CM | POA: Insufficient documentation

## 2019-11-23 DIAGNOSIS — Z9079 Acquired absence of other genital organ(s): Secondary | ICD-10-CM | POA: Insufficient documentation

## 2019-11-23 DIAGNOSIS — C519 Malignant neoplasm of vulva, unspecified: Secondary | ICD-10-CM | POA: Insufficient documentation

## 2019-11-23 DIAGNOSIS — Z90710 Acquired absence of both cervix and uterus: Secondary | ICD-10-CM | POA: Diagnosis not present

## 2019-11-23 DIAGNOSIS — M1990 Unspecified osteoarthritis, unspecified site: Secondary | ICD-10-CM | POA: Insufficient documentation

## 2019-11-23 DIAGNOSIS — J45909 Unspecified asthma, uncomplicated: Secondary | ICD-10-CM | POA: Insufficient documentation

## 2019-11-23 DIAGNOSIS — I10 Essential (primary) hypertension: Secondary | ICD-10-CM | POA: Diagnosis not present

## 2019-11-23 NOTE — Patient Instructions (Signed)
Your exam is reassuring today!  I will see you in 3 months.  If anything changes before then, please call to get your appointment moved up sooner.

## 2019-11-28 DIAGNOSIS — M9902 Segmental and somatic dysfunction of thoracic region: Secondary | ICD-10-CM | POA: Diagnosis not present

## 2019-11-28 DIAGNOSIS — M9901 Segmental and somatic dysfunction of cervical region: Secondary | ICD-10-CM | POA: Diagnosis not present

## 2019-11-28 DIAGNOSIS — M546 Pain in thoracic spine: Secondary | ICD-10-CM | POA: Diagnosis not present

## 2019-11-28 DIAGNOSIS — M531 Cervicobrachial syndrome: Secondary | ICD-10-CM | POA: Diagnosis not present

## 2019-12-26 DIAGNOSIS — M531 Cervicobrachial syndrome: Secondary | ICD-10-CM | POA: Diagnosis not present

## 2019-12-26 DIAGNOSIS — M9902 Segmental and somatic dysfunction of thoracic region: Secondary | ICD-10-CM | POA: Diagnosis not present

## 2019-12-26 DIAGNOSIS — M9901 Segmental and somatic dysfunction of cervical region: Secondary | ICD-10-CM | POA: Diagnosis not present

## 2019-12-26 DIAGNOSIS — M546 Pain in thoracic spine: Secondary | ICD-10-CM | POA: Diagnosis not present

## 2020-01-09 DIAGNOSIS — M9902 Segmental and somatic dysfunction of thoracic region: Secondary | ICD-10-CM | POA: Diagnosis not present

## 2020-01-09 DIAGNOSIS — M531 Cervicobrachial syndrome: Secondary | ICD-10-CM | POA: Diagnosis not present

## 2020-01-09 DIAGNOSIS — M9901 Segmental and somatic dysfunction of cervical region: Secondary | ICD-10-CM | POA: Diagnosis not present

## 2020-01-09 DIAGNOSIS — M546 Pain in thoracic spine: Secondary | ICD-10-CM | POA: Diagnosis not present

## 2020-01-23 DIAGNOSIS — M531 Cervicobrachial syndrome: Secondary | ICD-10-CM | POA: Diagnosis not present

## 2020-01-23 DIAGNOSIS — M546 Pain in thoracic spine: Secondary | ICD-10-CM | POA: Diagnosis not present

## 2020-01-23 DIAGNOSIS — M9902 Segmental and somatic dysfunction of thoracic region: Secondary | ICD-10-CM | POA: Diagnosis not present

## 2020-01-23 DIAGNOSIS — M9901 Segmental and somatic dysfunction of cervical region: Secondary | ICD-10-CM | POA: Diagnosis not present

## 2020-02-06 DIAGNOSIS — M531 Cervicobrachial syndrome: Secondary | ICD-10-CM | POA: Diagnosis not present

## 2020-02-06 DIAGNOSIS — M546 Pain in thoracic spine: Secondary | ICD-10-CM | POA: Diagnosis not present

## 2020-02-06 DIAGNOSIS — M9902 Segmental and somatic dysfunction of thoracic region: Secondary | ICD-10-CM | POA: Diagnosis not present

## 2020-02-06 DIAGNOSIS — M9901 Segmental and somatic dysfunction of cervical region: Secondary | ICD-10-CM | POA: Diagnosis not present

## 2020-02-12 ENCOUNTER — Telehealth: Payer: Self-pay

## 2020-02-12 ENCOUNTER — Telehealth: Payer: Self-pay | Admitting: Internal Medicine

## 2020-02-12 NOTE — Telephone Encounter (Signed)
Patient with abdominal pain and early satiety.  Has stomach cramps with some movements.  She will come in and see Carl Best, RNPtomorrow at 10:30

## 2020-02-12 NOTE — Telephone Encounter (Signed)
Patient called with c/o pain in left breast. Since she is a patient of Dr. Charisse March she wanted to come here to have her check it out.  Advised pt to see her gyn or primary doctor initially and depending on what the outcome is she may be able to be referred here.  Patient voiced understanding.

## 2020-02-13 ENCOUNTER — Ambulatory Visit: Payer: Medicare Other | Admitting: Nurse Practitioner

## 2020-02-13 ENCOUNTER — Other Ambulatory Visit (INDEPENDENT_AMBULATORY_CARE_PROVIDER_SITE_OTHER): Payer: Medicare Other

## 2020-02-13 ENCOUNTER — Encounter: Payer: Self-pay | Admitting: Nurse Practitioner

## 2020-02-13 VITALS — BP 120/76 | HR 76 | Ht 64.0 in | Wt 192.0 lb

## 2020-02-13 DIAGNOSIS — K219 Gastro-esophageal reflux disease without esophagitis: Secondary | ICD-10-CM | POA: Diagnosis not present

## 2020-02-13 DIAGNOSIS — E559 Vitamin D deficiency, unspecified: Secondary | ICD-10-CM | POA: Diagnosis not present

## 2020-02-13 DIAGNOSIS — D509 Iron deficiency anemia, unspecified: Secondary | ICD-10-CM

## 2020-02-13 DIAGNOSIS — R1012 Left upper quadrant pain: Secondary | ICD-10-CM

## 2020-02-13 LAB — CBC WITH DIFFERENTIAL/PLATELET
Basophils Absolute: 0.1 10*3/uL (ref 0.0–0.1)
Basophils Relative: 1.1 % (ref 0.0–3.0)
Eosinophils Absolute: 0.2 10*3/uL (ref 0.0–0.7)
Eosinophils Relative: 3.2 % (ref 0.0–5.0)
HCT: 42.2 % (ref 36.0–46.0)
Hemoglobin: 14.4 g/dL (ref 12.0–15.0)
Lymphocytes Relative: 35.9 % (ref 12.0–46.0)
Lymphs Abs: 2.7 10*3/uL (ref 0.7–4.0)
MCHC: 34.1 g/dL (ref 30.0–36.0)
MCV: 95.2 fl (ref 78.0–100.0)
Monocytes Absolute: 0.8 10*3/uL (ref 0.1–1.0)
Monocytes Relative: 10.3 % (ref 3.0–12.0)
Neutro Abs: 3.8 10*3/uL (ref 1.4–7.7)
Neutrophils Relative %: 49.5 % (ref 43.0–77.0)
Platelets: 245 10*3/uL (ref 150.0–400.0)
RBC: 4.44 Mil/uL (ref 3.87–5.11)
RDW: 12.5 % (ref 11.5–15.5)
WBC: 7.6 10*3/uL (ref 4.0–10.5)

## 2020-02-13 LAB — COMPREHENSIVE METABOLIC PANEL
ALT: 30 U/L (ref 0–35)
AST: 26 U/L (ref 0–37)
Albumin: 4.5 g/dL (ref 3.5–5.2)
Alkaline Phosphatase: 48 U/L (ref 39–117)
BUN: 12 mg/dL (ref 6–23)
CO2: 29 mEq/L (ref 19–32)
Calcium: 9.7 mg/dL (ref 8.4–10.5)
Chloride: 100 mEq/L (ref 96–112)
Creatinine, Ser: 0.79 mg/dL (ref 0.40–1.20)
GFR: 72.57 mL/min (ref >60.00)
Glucose, Bld: 100 mg/dL — ABNORMAL HIGH (ref 70–99)
Potassium: 3.8 mEq/L (ref 3.5–5.1)
Sodium: 138 mEq/L (ref 135–145)
Total Bilirubin: 0.5 mg/dL (ref 0.2–1.2)
Total Protein: 7.5 g/dL (ref 6.0–8.3)

## 2020-02-13 LAB — FERRITIN: Ferritin: 100.6 ng/mL (ref 10.0–291.0)

## 2020-02-13 LAB — IGA: IgA: 174 mg/dL (ref 68–378)

## 2020-02-13 MED ORDER — DICYCLOMINE HCL 10 MG PO CAPS: 10.0000 mg | ORAL_CAPSULE | ORAL | 0 refills | Status: DC

## 2020-02-13 NOTE — Patient Instructions (Signed)
If you are age 67 or older, your body mass index should be between 23-30. Your Body mass index is 32.96 kg/m. If this is out of the aforementioned range listed, please consider follow up with your Primary Care Provider.  If you are age 72 or younger, your body mass index should be between 19-25. Your Body mass index is 32.96 kg/m. If this is out of the aformentioned range listed, please consider follow up with your Primary Care Provider.   You have been scheduled for a CT scan of the abdomen and pelvis at [facility], 1st floor Radiology. You are scheduled on 02/22/2020  at 8:30am. You should arrive 15 minutes prior to your appointment time for registration.  Please pick up 2 bottles of contrast from [place] at least 3 days prior to your scan. The solution may taste better if refrigerated, but do NOT add ice or any other liquid to this solution. Shake well before drinking.   Please follow the written instructions below on the day of your exam:   1) Do not eat anything after 430am (4 hours prior to your test)   2) Drink 1 bottle of contrast @ 630am(2 hours prior to your exam)  Remember to shake well before drinking and do NOT pour over ice.     Drink 1 bottle of contrast @ 730am (1 hour prior to your exam)   You may take any medications as prescribed with a small amount of water, if necessary. If you take any of the following medications: METFORMIN, GLUCOPHAGE, GLUCOVANCE, AVANDAMET, RIOMET, FORTAMET, [place] MET, JANUMET, GLUMETZA or METAGLIP, you MAY be asked to HOLD this medication 48 hours AFTER the exam.   The purpose of you drinking the oral contrast is to aid in the visualization of your intestinal tract. The contrast solution may cause some diarrhea. Depending on your individual set of symptoms, you may also receive an intravenous injection of x-ray contrast/dye. Plan on being at [facility] for 45 minutes or longer, depending on the type of exam you are having performed.   If  you have any questions regarding your exam or if you need to reschedule, you may call [facility] at [phone] between the hours of 8:00 am and 5:00 pm, Monday-Friday.   Your provider has requested that you go to the basement level for lab work before leaving today. Press "B" on the elevator. The lab is located at the first door on the left as you exit the elevator.  We have sent the following medications to your pharmacy for you to pick up at your convenience: Dicyclomine 10 mg 1 tablet every 8 hours as needed Do not take muscle relaxant if taking dicyclomine   Due to recent changes in healthcare laws, you may see the results of your imaging and laboratory studies on MyChart before your provider has had a chance to review them.  We understand that in some cases there may be results that are confusing or concerning to you. Not all laboratory results come back in the same time frame and the provider may be waiting for multiple results in order to interpret others.  Please give [place] 48 hours in order for your provider to thoroughly review all the results before contacting the office for clarification of your results.   Thank you for choosing [facility] Noralyn Pick, CRNP

## 2020-02-13 NOTE — Progress Notes (Addendum)
02/13/2020 [name] [id number] 1950/08/10   Chief Complaint:  LUQ pain   History of Present Illness:  [name]. Schweppe is a 69 year old female with a past medial history of arthritis, asthma, hypertension, IDA and GERD. She is followed by Dr. Carlean Purl. She presents today for further evaluation for LUQ abdominal pain which started 6 months ago and occurs several times daily and lasts for less than one minute. She describes the pain as deep inside with a positional component. If she leans forward to pick something up from the floor she develops a muscle like spasms that wraps around to the left mid back. She initially suspected she had musculoskeletal pain for which she took Cyclobenzaprine 28m PRN without significant improvement. No N/V. No  food triggers. No fever, sweats or chills. She is passing 3 to 4 normal formed BMs daily. No rectal bleeding or melena.  Paternal grandmother with history of colon cancer. GERD is well controlled. She is taking Famotidine 272mpo bid. She constantly feels like something is in her stomach. She underwent an EGD 04/18/2018 which showed numerous fundic gland gastric polyps. No NSAIDs. Colonoscopy 07/31/2015 was normal.    Current Outpatient Medications on File Prior to Visit  Medication Sig Dispense Refill  . cyclobenzaprine (FLEXERIL) 5 MG tablet TAKE 1 TO 2 TABLETS(5 TO 10 MG) BY MOUTH TWICE DAILY AS NEEDED FOR MUSCLE SPASMS 40 tablet 0  . diphenhydrAMINE (BENADRYL) 25 MG tablet Take 50 mg by mouth at bedtime as needed for itching. 2 tablets twice daily     . famotidine (PEPCID) 40 MG tablet TAKE 1/2 TABLET BY MOUTH TWICE DAILY 90 tablet 3  . imiquimod (ALDARA) 5 % cream Apply topically 3 (three) times a week. Apply to right vulva 3 times a week, use for 12 weeks 12 each 1  . lansoprazole (PREVACID) 15 MG capsule Take 1 capsule (15 mg total) by mouth 2 (two) times daily before a meal.    . losartan-hydrochlorothiazide (HYZAAR) 100-25 MG tablet TK 1 T PO QD  90 tablet 3  . metoprolol tartrate (LOPRESSOR) 25 MG tablet Take 0.5 tablets (12.5 mg total) by mouth 2 (two) times daily. 90 tablet 3  . montelukast (SINGULAIR) 10 MG tablet TAKE 1 TABLET(10 MG) BY MOUTH EVERY MORNING 90 tablet 3  . Triamcinolone & Emollient (DERMASORB TA) 0.1 % KIT   0  . triamcinolone ointment (KENALOG) 0.5 % Apply 1 application topically 2 (two) times daily. 30 g 1   No current facility-administered medications on file prior to visit.   Allergies  Allergen Reactions  . Ibuprofen Hypertension  . Naprosyn [Naproxen] Nausea Only  . Other     Allergic to toothpaste- makes mouth peel Hay fever/dust mites/trees "365 allergic"   . Sevoflurane Nausea And Vomiting  . Azithromycin Itching and Rash  . Sulfa Antibiotics Hives and Rash    Review of Systems: See HPI, all other systems reviewed and are negative   Current Medications, Allergies, Past Medical History, Past Surgical History, Family History and Social History were reviewed in [employer].   Physical Exam: BP 120/76   Pulse 76   Ht 5' 4"  (1.626 m)   Wt 192 lb (87.1 kg)   BMI 32.96 kg/m    Wt Readings from Last 3 Encounters:  02/13/20 192 lb (87.1 kg)  11/23/19 188 lb (85.3 kg)  08/20/19 187 lb (84.8 kg)   General: Well developed [age]ear old female in no acute  distress. Head: Normocephalic and atraumatic. Eyes: No scleral icterus. Conjunctiva pink . Ears: Normal auditory acuity. Mouth: Dentition intact. No ulcers or lesions.  Lungs: Clear throughout to auscultation. Heart: Regular rate and rhythm, no murmur. Abdomen: Soft, nondistended. Deep LUQ tenderness without rebound or guarding. No masses or hepatomegaly. Normal bowel sounds x 4 quadrants. No CVA tenderness.  Rectal: Deferred.  Musculoskeletal: Symmetrical with no gross deformities. Extremities: No edema. Neurological: Alert oriented x 4. No focal deficits.  Psychological: Alert and cooperative. Normal mood and  affect  Assessment and Recommendations:  1. Deep LUQ pain with a positional component  -CBC, CMP, CRP level  -CTAP with contrast  -Further recommendations to be determined after CT and lab results reviewed  -Consider repeat EGD if CTAP negative -Dicyclomine 48m one tab po Q 8 hrs PRN abdominal pain, patient not to take Cyclobenzaprine if taking Dicyclomine   2. GERD, stable. EGD 04/2018 showed fundic gland polyps otherwise was normal.  -Continue Famotidine 243mpo bid  -See plan in # 1  2. Prior history of IDA, most likely due to past blood donation  -See lab order in # 1, tTg and IgA level    Agree with plans. If eval negative and persistent sxs of stomach fullness consider buspirone 10 mg bid for functional dyspepsia  CaGatha MayerMD, [facility]

## 2020-02-14 LAB — IRON, TOTAL/TOTAL IRON BINDING CAP
%SAT: 21 % (calc) (ref 16–45)
Iron: 79 ug/dL (ref 45–160)
TIBC: 378 mcg/dL (calc) (ref 250–450)

## 2020-02-14 LAB — TISSUE TRANSGLUTAMINASE, IGA: (tTG) Ab, IgA: 1 U/mL

## 2020-02-18 ENCOUNTER — Encounter: Payer: Self-pay | Admitting: Obstetrics & Gynecology

## 2020-02-18 ENCOUNTER — Other Ambulatory Visit: Payer: Self-pay | Admitting: Obstetrics & Gynecology

## 2020-02-18 ENCOUNTER — Other Ambulatory Visit: Payer: Self-pay

## 2020-02-18 ENCOUNTER — Ambulatory Visit: Payer: Medicare Other | Admitting: Obstetrics & Gynecology

## 2020-02-18 VITALS — BP 148/77 | HR 71 | Ht 64.0 in

## 2020-02-18 DIAGNOSIS — N644 Mastodynia: Secondary | ICD-10-CM

## 2020-02-18 NOTE — Patient Instructions (Signed)

## 2020-02-18 NOTE — Progress Notes (Signed)
GYNECOLOGY OFFICE VISIT NOTE  History:   [name] is a 69 y.o. PMP here today for evaluation of left breast pain x 1 day.  Had mammogram in 07/2019 and follow up left breast ultrasound that showed 2 adjacent cysts about 0.4 x 0.8 cm in size. These were thought to be benign, and follow up mammogram was recommended in one year.  She is concerned about this onset of pain, wants reevaluation.  She denies any abnormal vaginal discharge, bleeding, pelvic pain or other concerns.    Past Medical History:  Diagnosis Date  . Allergic rhinitis   . Arthritis   . Asthma    mild no inaler use  . GERD (gastroesophageal reflux disease)   . Headache(784.0)    Migraines  . History of adenomatous polyp of colon   . History of concussion    AS CHILD--  NO RESIDUAL  . History of palpitations   . Hypertension   . Paget's disease of vulva   . PONV (postoperative nausea and vomiting)    SEVERE  . Wears glasses     Past Surgical History:  Procedure Laterality Date  . mucoid cyst removal thumb    . 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 PULLEY RELEASE RIGHT THUMB  03-23-2011  . SIMPLE VULVECTOMY  03-24-2010   right side  . TONSILLECTOMY  1977  . TRIGGER FINGER RELEASE    . VAGINAL HYSTERECTOMY  1989   and Anterior and posterior repair's for prolapse partial  . VULVECTOMY Right 01/10/2015   Procedure: RIGHT WIDE LOCAL EXCISION VULVECTOMY;  Surgeon: [name], MD;  Location: [facility];  Service: Gynecology;  Laterality: Right;  Marland Kitchen VULVECTOMY Right 04/10/2015   Procedure: WIDE LOCAL EXCISION VULVA;  Surgeon: [name], MD;  Location: [facility];  Service: Gynecology;  Laterality: Right;  Marland Kitchen VULVECTOMY PARTIAL N/A 11/07/2018   Procedure: VULVECTOMY PARTIAL;  Surgeon: [name], MD;  Location: [facility];  Service: Gynecology;  Laterality: N/A;   . WISDOM TOOTH EXTRACTION      The following portions of the patient's history were reviewed and updated as appropriate: allergies, current medications, past family history, past medical history, past social history, past surgical history and problem list.    Review of Systems:  Pertinent items noted in HPI and remainder of comprehensive ROS otherwise negative.  Physical Exam:  BP (!) 148/77   Pulse 71   Ht 5\' 4"  (1.626 m)   BMI 32.96 kg/m  CONSTITUTIONAL: Well-developed, well-nourished female in no acute distress.  MUSCULOSKELETAL: Normal range of motion. No tenderness.  No cyanosis, clubbing, or edema.  2+ distal pulses. NEUROLOGIC: Alert and oriented to person, place, and time. Normal reflexes, muscle tone coordination.  PSYCHIATRIC: Normal mood and affect. Normal behavior. Normal judgment and thought content. CARDIOVASCULAR: Normal heart rate noted, regular rhythm RESPIRATORY: Clear to auscultation bilaterally. Effort and breath sounds normal, no problems with respiration noted. BREASTS: Symmetric in size. No masses, tenderness, skin changes, nipple drainage, or lymphadenopathy bilaterally. ABDOMEN: Soft, no distention noted.  No tenderness, rebound or guarding.  PELVIC: Deferred    Assessment and Plan:    1. Breast pain, left Unremarkable exam but given findings on breast ultrasound in 08/2019, repeat ultrasound ordered. will follow up results and manage accordingly. - US BREAST LTD UNI LEFT INC AXILLA; Future Routine preventative health maintenance measures emphasized. Please refer to After Visit  Summary for other counseling recommendations.   Return for any gynecologic concerns.    Total face-to-face time with patient: 20 minutes.  Over 50% of encounter was spent on counseling and coordination of care.   Verita Schneiders, MD, [place] for [employer], [place]

## 2020-02-19 ENCOUNTER — Telehealth: Payer: Self-pay | Admitting: *Deleted

## 2020-02-19 NOTE — Telephone Encounter (Signed)
Patient called and moved her appt from 9/21 to 9/24

## 2020-02-22 ENCOUNTER — Other Ambulatory Visit: Payer: Self-pay

## 2020-02-22 ENCOUNTER — Ambulatory Visit (HOSPITAL_COMMUNITY)
Admission: RE | Admit: 2020-02-22 | Discharge: 2020-02-22 | Disposition: A | Discharge status: Home / Self Care | Payer: Medicare Other | Source: Ambulatory Visit | Attending: Nurse Practitioner | Admitting: Nurse Practitioner

## 2020-02-22 DIAGNOSIS — D509 Iron deficiency anemia, unspecified: Secondary | ICD-10-CM | POA: Diagnosis not present

## 2020-02-22 DIAGNOSIS — K5730 Diverticulosis of large intestine without perforation or abscess without bleeding: Secondary | ICD-10-CM | POA: Diagnosis not present

## 2020-02-22 DIAGNOSIS — R1012 Left upper quadrant pain: Secondary | ICD-10-CM

## 2020-02-22 DIAGNOSIS — Z90710 Acquired absence of both cervix and uterus: Secondary | ICD-10-CM | POA: Diagnosis not present

## 2020-02-22 DIAGNOSIS — I700 Atherosclerosis of aorta: Secondary | ICD-10-CM | POA: Diagnosis not present

## 2020-02-22 IMAGING — CT CT ABD-PELV W/ CM
2 of 5 series · 16 of 46 positions shown, 18 images · IV contrast (ISOVUE 300)
Comparison: None.

CLINICAL DATA: LEFT upper quadrant pain

EXAM:
CT ABDOMEN AND PELVIS WITH CONTRAST
TECHNIQUE: Multidetector CT imaging of the abdomen and pelvis was performed
using the standard protocol following bolus administration of
intravenous contrast.
CONTRAST:  100mL OMNIPAQUE IOHEXOL 300 MG/ML  SOLN

[Series 2: axial st · axial · 0.82mm/px · z∈[+1106,+1500]mm · 13 of 91 slices shown, 15 images]
[im 6/91  soft-tissue]
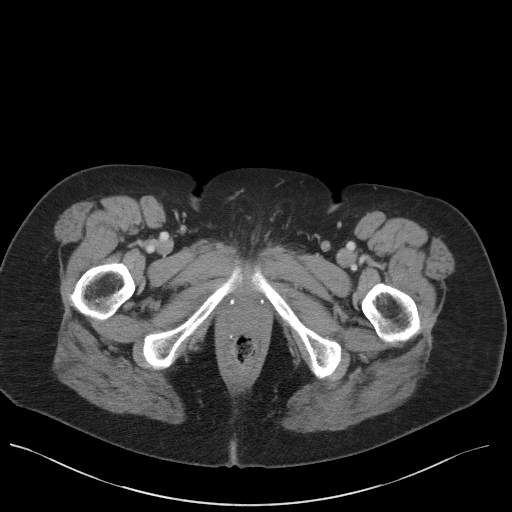
[im 6/91  bone]
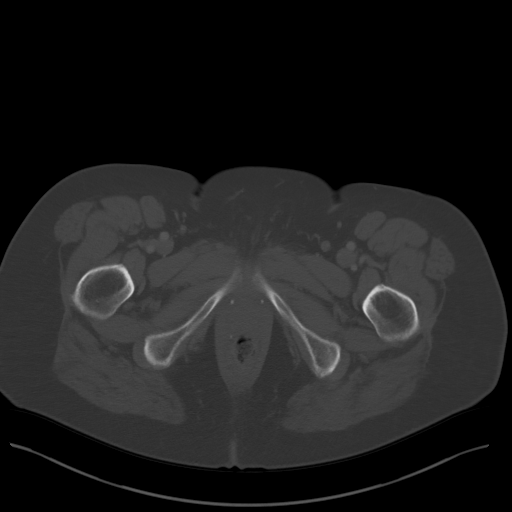
[im 11/91  soft-tissue]
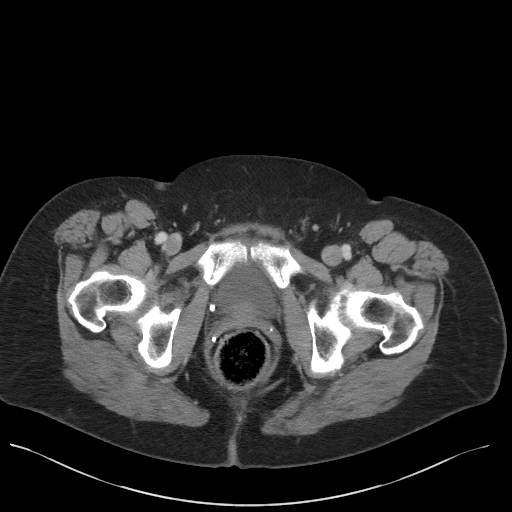
[im 22/91  soft-tissue]
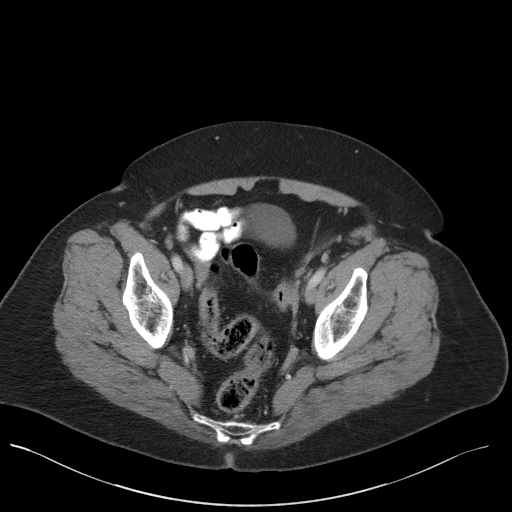
[im 27/91  soft-tissue]
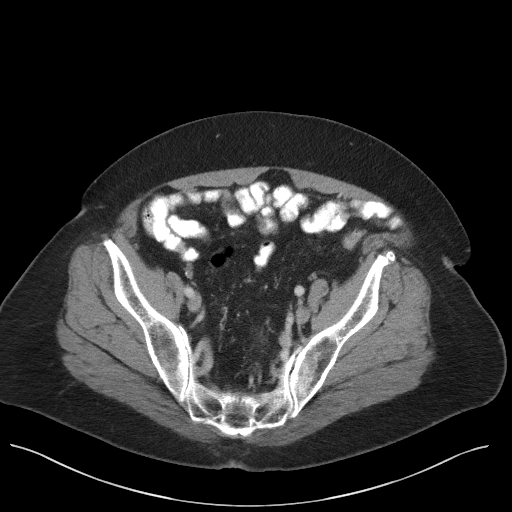
[im 32/91  soft-tissue]
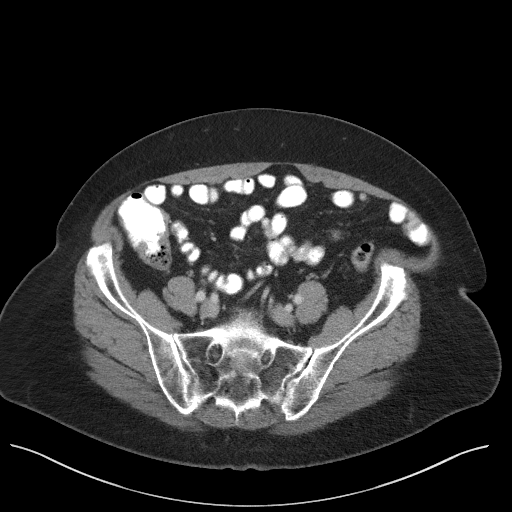
[im 38/91  soft-tissue]
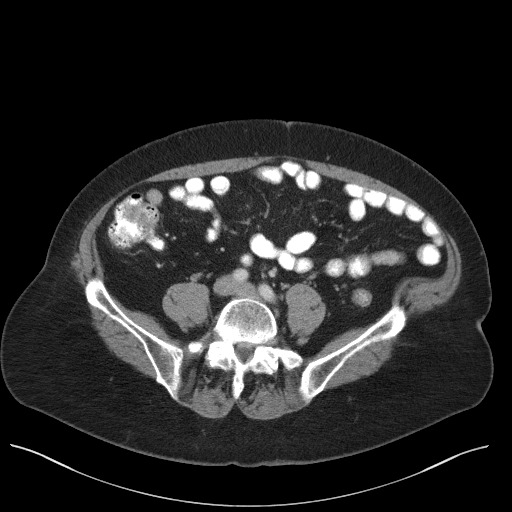
[im 48/91  soft-tissue]
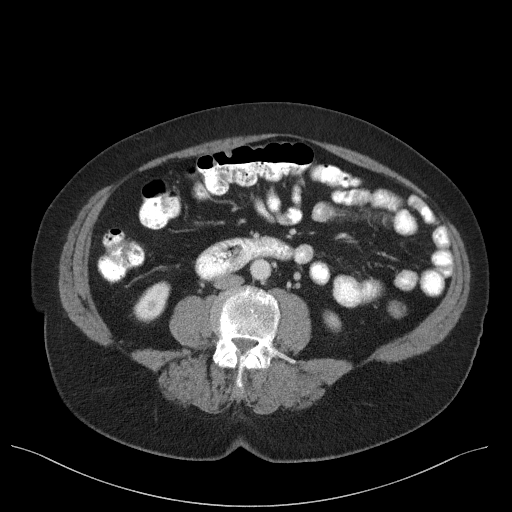
[im 53/91  soft-tissue]
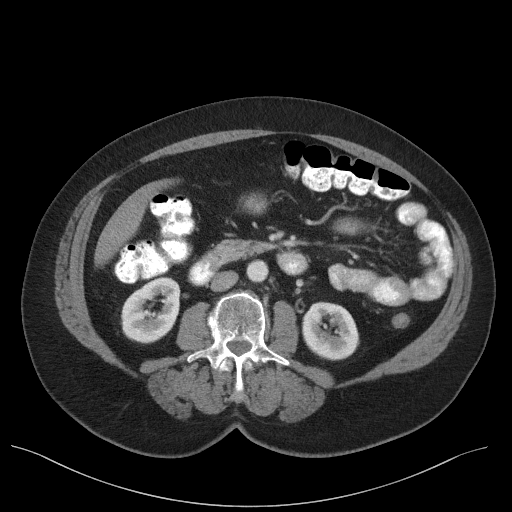
[im 59/91  soft-tissue]
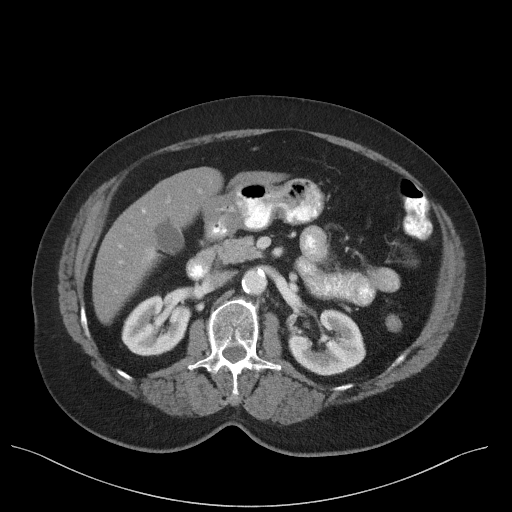
[im 59/91  bone]
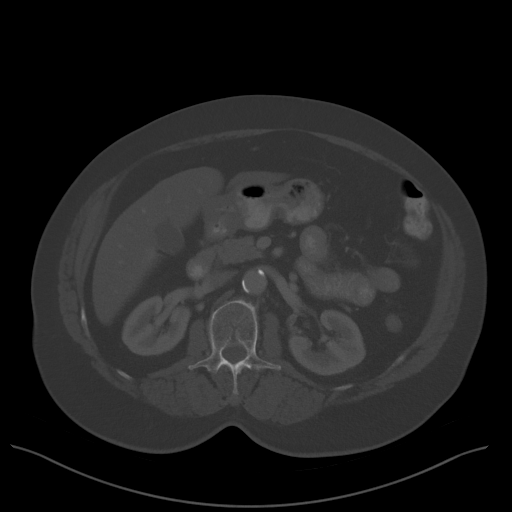
[im 64/91  soft-tissue]
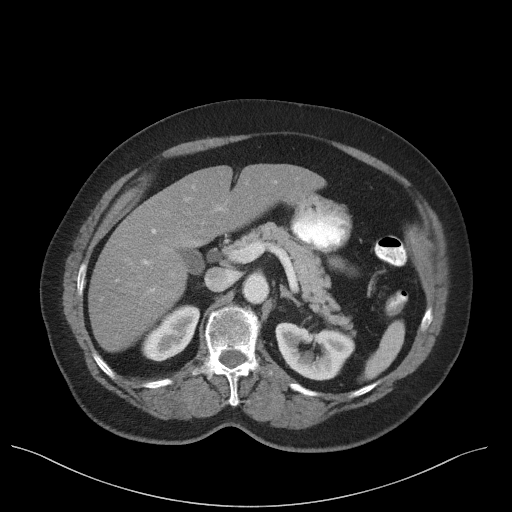
[im 69/91  soft-tissue]
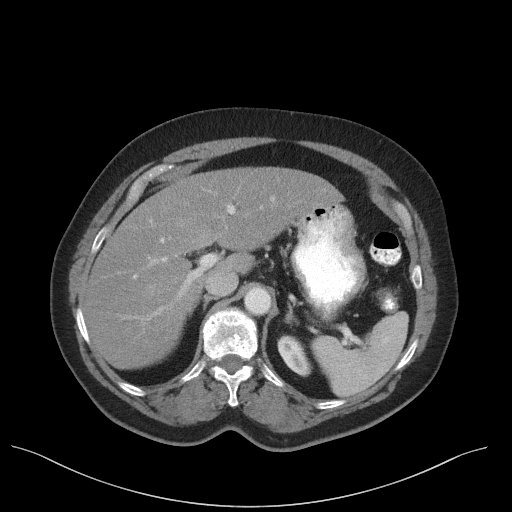
[im 80/91  soft-tissue]
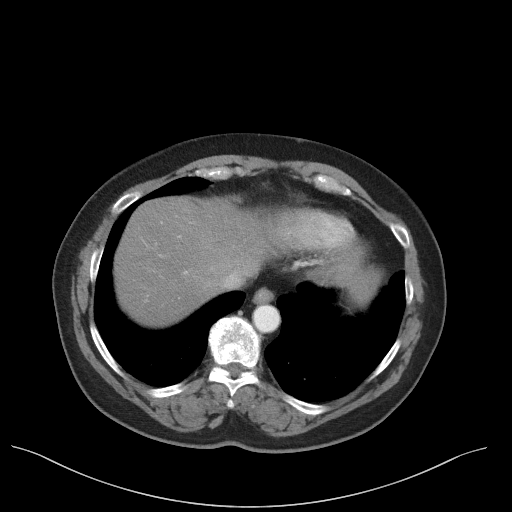
[im 85/91  soft-tissue]
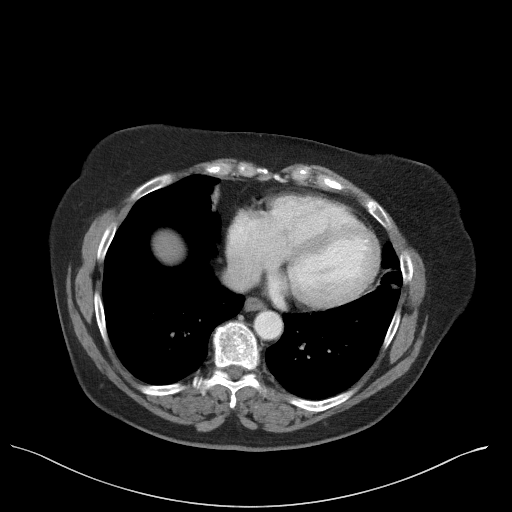

[Series 4: coronal st · coronal · 0.68mm/px · 3 of 95 slices shown]
[im 32/95  soft-tissue]
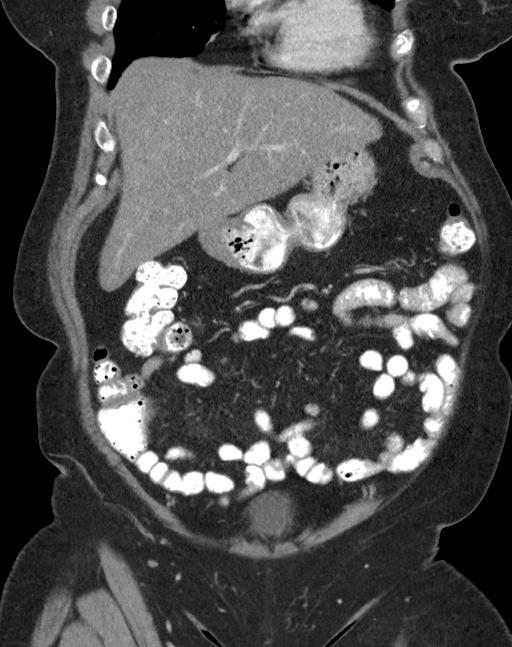
[im 42/95  soft-tissue]
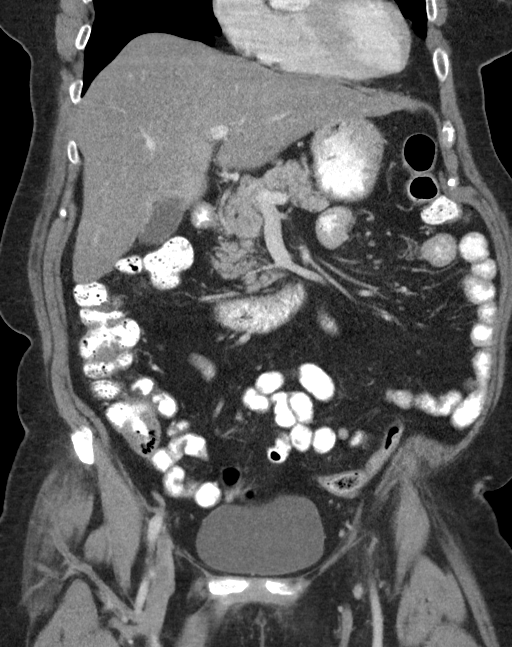
[im 53/95  soft-tissue]
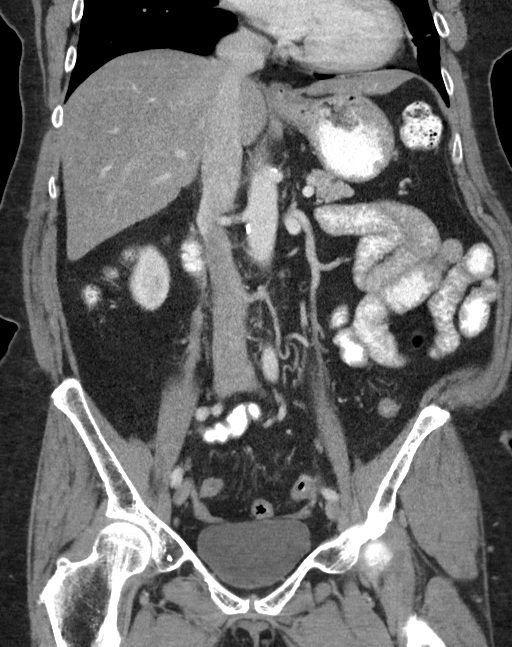

[16 of 46 positions shown; findings below may reference images not displayed]

FINDINGS: Lower chest: Lung bases are clear.

Hepatobiliary: No focal hepatic lesion. No biliary duct dilatation.
Common bile duct is normal.

Pancreas: Pancreas is normal. No ductal dilatation. No pancreatic
inflammation.

Spleen: Normal spleen

Adrenals/urinary tract: Adrenal glands and kidneys are normal. The
ureters and bladder normal.

Stomach/Bowel: Stomach, small-bowel and cecum are normal. The
appendix is not identified but there is no pericecal inflammation to
suggest appendicitis. Several diverticula of the descending colon.
No acute inflammation. Rectosigmoid colon normal.

Vascular/Lymphatic: Abdominal aorta is normal caliber with
atherosclerotic calcification. There is no retroperitoneal or
periportal lymphadenopathy. No pelvic lymphadenopathy.

Reproductive: Post hysterectomy.  Adnexa unremarkable

Other: No free fluid.

Musculoskeletal: No aggressive osseous lesion.
IMPRESSION: 1. No acute findings in the abdomen pelvis.
2. LEFT colon diverticulosis without evidence of diverticulitis.
3. Aortic Atherosclerosis ([35]-[35]).

## 2020-02-22 MED ORDER — IOHEXOL 300 MG/ML  SOLN
100.0000 mL | Freq: Once | INTRAMUSCULAR | Status: AC | PRN
  Administered 2020-02-22: 100 mL via INTRAVENOUS

## 2020-02-26 ENCOUNTER — Ambulatory Visit: Payer: Medicare Other | Admitting: Gynecologic Oncology

## 2020-02-29 ENCOUNTER — Encounter: Payer: Self-pay | Admitting: Gynecologic Oncology

## 2020-02-29 ENCOUNTER — Inpatient Hospital Stay: Payer: Medicare Other | Attending: Gynecologic Oncology | Admitting: Gynecologic Oncology

## 2020-02-29 ENCOUNTER — Other Ambulatory Visit: Payer: Self-pay

## 2020-02-29 VITALS — BP 125/78 | HR 88 | Temp 98.0°F | Wt 190.8 lb

## 2020-02-29 DIAGNOSIS — C519 Malignant neoplasm of vulva, unspecified: Secondary | ICD-10-CM | POA: Diagnosis not present

## 2020-02-29 DIAGNOSIS — K219 Gastro-esophageal reflux disease without esophagitis: Secondary | ICD-10-CM | POA: Diagnosis not present

## 2020-02-29 DIAGNOSIS — I10 Essential (primary) hypertension: Secondary | ICD-10-CM | POA: Diagnosis not present

## 2020-02-29 DIAGNOSIS — M1990 Unspecified osteoarthritis, unspecified site: Secondary | ICD-10-CM | POA: Insufficient documentation

## 2020-02-29 DIAGNOSIS — Z79899 Other long term (current) drug therapy: Secondary | ICD-10-CM | POA: Insufficient documentation

## 2020-02-29 DIAGNOSIS — Z87891 Personal history of nicotine dependence: Secondary | ICD-10-CM | POA: Insufficient documentation

## 2020-02-29 DIAGNOSIS — J45909 Unspecified asthma, uncomplicated: Secondary | ICD-10-CM | POA: Insufficient documentation

## 2020-02-29 DIAGNOSIS — Z90711 Acquired absence of uterus with remaining cervical stump: Secondary | ICD-10-CM | POA: Insufficient documentation

## 2020-02-29 NOTE — Patient Instructions (Signed)
Everything looks great on your exam today!  I will see you in 6 months (please call the clinic in December to get a visit with me scheduled for late March next year).  If you develop any symptoms in the meantime, please call the clinic to come in sooner.

## 2020-02-29 NOTE — Progress Notes (Signed)
Gynecologic Oncology Return Clinic Visit  02/29/20  Reason for Visit: Follow-up visit in the setting of Paget's disease of the vulva  Treatment History: The patient previously had a supracervical hysterectomy but has her ovaries and cervix in situ. Surgery for Paget's disease of the vulva in October 2011. There was focal involvement of the medial margin. The entire lesion is approximately 5 cm in diameter. She then underwent wide local excision of a recurrent Paget's lesion in August 2016. She had surgical margins that were focally positive and subsequently underwentanother excision on 04/10/2015 with negative margins. RVUYEBX vulvar biopsy from the right inNovember 2017 that was negative. She was seen for a visible lesion and vulvar symptoms in April 2020 and a biopsy of this lesion was consistent with Paget's disease. On 11/07/2018, she underwent partial left vulvectomy with findings of a 1 cm white hyperkeratotic lesion on the left labia minora close to the clitoris. Margins were positive. Plan was for consideration of Aldara since repeat excision could involve removal of the clitoris. Patient was seen on 01/02/2019 with a slightly erythematous area on the right vulva. Plan was for repeat examination in September with biopsy if persistence of the lesion. On 9/20, patient underwent right vulvar biopsy which revealed extramammary Paget's disease. After discussion of treatment options, the patient opted for Aldara use. She was seen in early December, at which time her right vulvar lesion had completely resolved. At this visit, a small right periclitoral lesion was noted. Given her excellent response to the previous lesion, her decision was to proceed with Aldara use for this new lesion. Saw me on 07/23/19 with a right peri-clitoral lesion suspected to be side effect from Aldara cream use.  Interval History: Patient presents today reporting overall doing well.  She denies any significant  vulvar symptoms since I last saw her including pain or pruritus.  She denies any vaginal bleeding or discharge.  She reports having a good appetite without any nausea or emesis.  She reports regular bowel and bladder function.  She underwent CT scan in mid September for left upper quadrant pain.  There was left colon diverticulosis but no acute findings noted.  Past Medical/Surgical History: Past Medical History:  Diagnosis Date  . Allergic rhinitis   . Arthritis   . Asthma    mild no inaler use  . GERD (gastroesophageal reflux disease)   . Headache(784.0)    Migraines  . History of adenomatous polyp of colon   . History of concussion    AS CHILD--  NO RESIDUAL  . History of palpitations   . Hypertension   . Paget's disease of vulva   . PONV (postoperative nausea and vomiting)    SEVERE  . Wears glasses     Past Surgical History:  Procedure Laterality Date  . mucoid cyst removal thumb    . 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 PULLEY RELEASE RIGHT THUMB  03-23-2011  . SIMPLE VULVECTOMY  03-24-2010   right side  . TONSILLECTOMY  1977  . TRIGGER FINGER RELEASE    . VAGINAL HYSTERECTOMY  1989   and Anterior and posterior repair's for prolapse partial  . VULVECTOMY Right 01/10/2015   Procedure: RIGHT WIDE LOCAL EXCISION VULVECTOMY;  Surgeon: Maria Sleigh, MD;  Location: [facility];  Service: Gynecology;  Laterality: Right;  Marland Kitchen VULVECTOMY Right 04/10/2015   Procedure: WIDE LOCAL EXCISION VULVA;  Surgeon: Maria Sleigh, MD;  Location: [facility];  Service: Gynecology;  Laterality: Right;  Marland Kitchen VULVECTOMY PARTIAL N/A 11/07/2018   Procedure: VULVECTOMY PARTIAL;  Surgeon: Maria Marus, MD;  Location: [facility];  Service: Gynecology;  Laterality: N/A;  . WISDOM TOOTH EXTRACTION      Family History  Problem Relation Age of Onset  . Cancer  Maternal Grandmother   . Hypertension Father   . Heart disease Father   . Asthma Father   . Hypertension Mother   . Heart disease Mother   . Breast cancer Paternal Grandmother 21  . Colon cancer Paternal Grandmother   . Colon cancer Paternal Uncle   . Allergic rhinitis Neg Hx   . Angioedema Neg Hx   . Eczema Neg Hx   . Immunodeficiency Neg Hx   . Urticaria Neg Hx   . Esophageal cancer Neg Hx   . Rectal cancer Neg Hx   . Stomach cancer Neg Hx     Social History   Socioeconomic History  . Marital status: Married    Spouse name: Not on file  . Number of children: 3  . Years of education: Not on file  . Highest education level: Not on file  Occupational History  . Occupation: retired  Tobacco Use  . Smoking status: Former Smoker    Years: 5.00    Types: Cigarettes    Quit date: 04/15/1989    Years since quitting: 30.8  . Smokeless tobacco: Never Used  Vaping Use  . Vaping Use: Never used  Substance and Sexual Activity  . Alcohol use: Yes    Alcohol/week: 1.0 standard drink    Types: 1 Glasses of wine per week    Comment: occasional wine  . Drug use: No  . Sexual activity: Not on file  Other Topics Concern  . Not on file  Social History Narrative   She is married.  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 Difficulty of Paying Living Expenses: Not hard at all  Food Insecurity: No Food Insecurity  . Worried About Charity fundraiser in the Last Year: Never true  . Ran Out of Food in the Last Year: Never true  Transportation Needs: No Transportation Needs  . Lack of Transportation (Medical): No  . Lack of Transportation (Non-Medical): No  Physical Activity: Inactive  . Days of Exercise per Week: 0 days  . Minutes of Exercise per Session: 0 min  Stress: No Stress Concern Present  . Feeling of Stress :  Not at all  Social Connections:   . Frequency of Communication with Friends and Family: Not on file  . Frequency of Social Gatherings with Friends and Family: Not on file  . Attends Religious Services: Not on file  . Active Member of Clubs or Organizations: Not on file  . Attends Archivist Meetings: Not on file  . Marital Status: Not on file    Current Medications:  Current Outpatient Medications:  .  cyclobenzaprine (FLEXERIL) 5 MG tablet, TAKE 1 TO 2 TABLETS(5 TO 10 MG) BY MOUTH TWICE DAILY AS NEEDED FOR MUSCLE SPASMS, Disp: 40 tablet, Rfl: 0 .  dicyclomine (BENTYL) 10 MG capsule, Take 1 capsule (10 mg total) by mouth every 8 (eight) weeks., Disp: 30 capsule, Rfl: 0 .  famotidine (PEPCID) 40 MG tablet,  TAKE 1/2 TABLET BY MOUTH TWICE DAILY, Disp: 90 tablet, Rfl: 3 .  lansoprazole (PREVACID) 15 MG capsule, Take 1 capsule (15 mg total) by mouth 2 (two) times daily before a meal., Disp: , Rfl:  .  losartan-hydrochlorothiazide (HYZAAR) 100-25 MG tablet, TK 1 T PO QD, Disp: 90 tablet, Rfl: 3 .  metoprolol tartrate (LOPRESSOR) 25 MG tablet, Take 0.5 tablets (12.5 mg total) by mouth 2 (two) times daily., Disp: 90 tablet, Rfl: 3 .  montelukast (SINGULAIR) 10 MG tablet, TAKE 1 TABLET(10 MG) BY MOUTH EVERY MORNING, Disp: 90 tablet, Rfl: 3 .  Triamcinolone & Emollient (DERMASORB TA) 0.1 % KIT, , Disp: , Rfl: 0 .  triamcinolone ointment (KENALOG) 0.5 %, Apply 1 application topically 2 (two) times daily., Disp: 30 g, Rfl: 1 .  imiquimod (ALDARA) 5 % cream, Apply topically 3 (three) times a week. Apply to right vulva 3 times a week, use for 12 weeks (Patient not taking: Reported on 02/18/2020), Disp: 12 each, Rfl: 1  Review of Systems: Endorses heaviness in her stomach that she attributes to findings at the time of her endoscopy last year. Denies appetite changes, fevers, chills, fatigue, unexplained weight changes. Denies hearing loss, neck lumps or masses, mouth sores, ringing in ears or  voice changes. Denies cough or wheezing.  Denies shortness of breath. Denies chest pain or palpitations. Denies leg swelling. Denies abdominal distention, pain, blood in stools, constipation, diarrhea, nausea, vomiting, or early satiety. Denies pain with intercourse, dysuria, frequency, hematuria or incontinence. Denies hot flashes, pelvic pain, vaginal bleeding or vaginal discharge.   Denies joint pain, back pain or muscle pain/cramps. Denies itching, rash, or wounds. Denies dizziness, headaches, numbness or seizures. Denies swollen lymph nodes or glands, denies easy bruising or bleeding. Denies anxiety, depression, confusion, or decreased concentration.  Physical Exam: BP 125/78 (BP Location: Right Arm, Patient Position: Sitting)   Pulse 88   Temp 98 F (36.7 C) (Tympanic)   Wt 190 lb 12.8 oz (86.5 kg)   SpO2 96%   BMI 32.75 kg/m  General: Alert, oriented, no acute distress. HEENT: Normocephalic, atraumatic, sclera anicteric. Chest: Unlabored breathing on room air. Abdomen: soft, nontender.  Normoactive bowel sounds.  No masses or hepatosplenomegaly appreciated.   GU: Normal appearing external genitalia without erythema, excoriation, or lesions.  There is approximately 1 cm white scar line at the outer edge of the labia on the right, where patient previously had a small area of ulceration.  This has healed completely now.  No other erythematous areas or ulcerations noted.    Laboratory & Radiologic Studies: None new  Assessment & Plan: [name] is a 69 y.o. woman with history of recurrent Paget's disease of the vulva.  Patient has done very well since her last exam without any evidence of recurrent Paget's disease of the vulva.  She has not had to use Aldara in the last 3 months.  We discussed spacing out her visits to once every 6 months unless she develops symptoms.  She knows if this happens, to call me to be seen sooner.  She still has a prescription for Aldara if she  were to have recurrent symptoms.  Patient and I had a discussion today about Covid vaccines.  Neither she nor her husband have been vaccinated.  I asked to discuss the reason that they have decided not to.  She brought up several concerns in terms of the vaccine and how it is created and possible effects or changes that it can  cause in her bodies.  I did my best to encourage her to research the vaccine using reputable sources given the large amount of information that exists on the Internet.  I also described my own experience with both patients and acquaintances who have gotten sick with the disease.  I stressed the importance of the disease both in terms of preventing infection and decreasing the risk of severe infection.  The patient's son used to work as a Audiological scientist and has described to her patients that he has seen with Covid.  I offered that we could get the patient her first vaccine dose today, but she is not ready to make a decision regarding vaccination at this time.  25 minutes of total time was spent for this patient encounter, including preparation, face-to-face counseling with the patient and coordination of care, and documentation of the encounter.  Jeral Pinch, MD  Division of Gynecologic Oncology  Department of Obstetrics and Gynecology  [facility]

## 2020-03-03 ENCOUNTER — Ambulatory Visit: Payer: Medicare Other

## 2020-03-03 ENCOUNTER — Other Ambulatory Visit: Payer: Self-pay

## 2020-03-03 ENCOUNTER — Ambulatory Visit
Admission: RE | Admit: 2020-03-03 | Discharge: 2020-03-03 | Disposition: A | Discharge status: Home / Self Care | Payer: Medicare Other | Source: Ambulatory Visit | Attending: Obstetrics & Gynecology | Admitting: Obstetrics & Gynecology

## 2020-03-03 DIAGNOSIS — R928 Other abnormal and inconclusive findings on diagnostic imaging of breast: Secondary | ICD-10-CM | POA: Diagnosis not present

## 2020-03-03 DIAGNOSIS — N644 Mastodynia: Secondary | ICD-10-CM

## 2020-03-03 IMAGING — MG MM DIGITAL DIAGNOSTIC UNILAT*L* W/ TOMO W/ CAD
4 series · 4 of 12 positions shown · non-contrast
Comparison: Previous exam(s).

CLINICAL DATA: 69-year-old female presenting with diffuse
intermittent left breast pain. No associated lump.

EXAM:
DIGITAL DIAGNOSTIC UNILATERAL LEFT MAMMOGRAM WITH TOMO AND CAD

[L CC synth-2D]
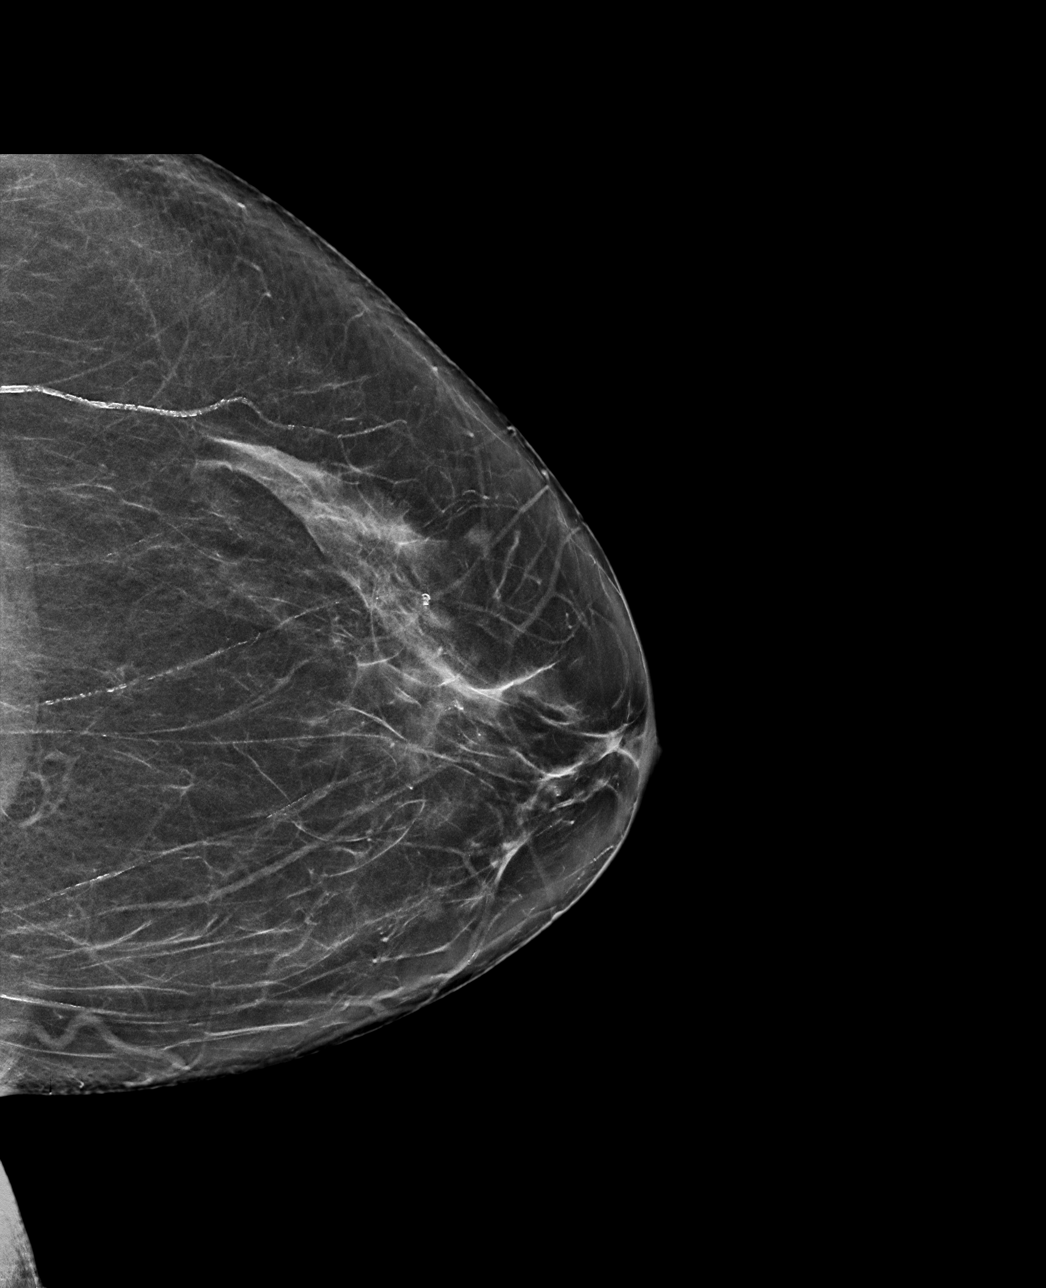

[L MLO synth-2D]
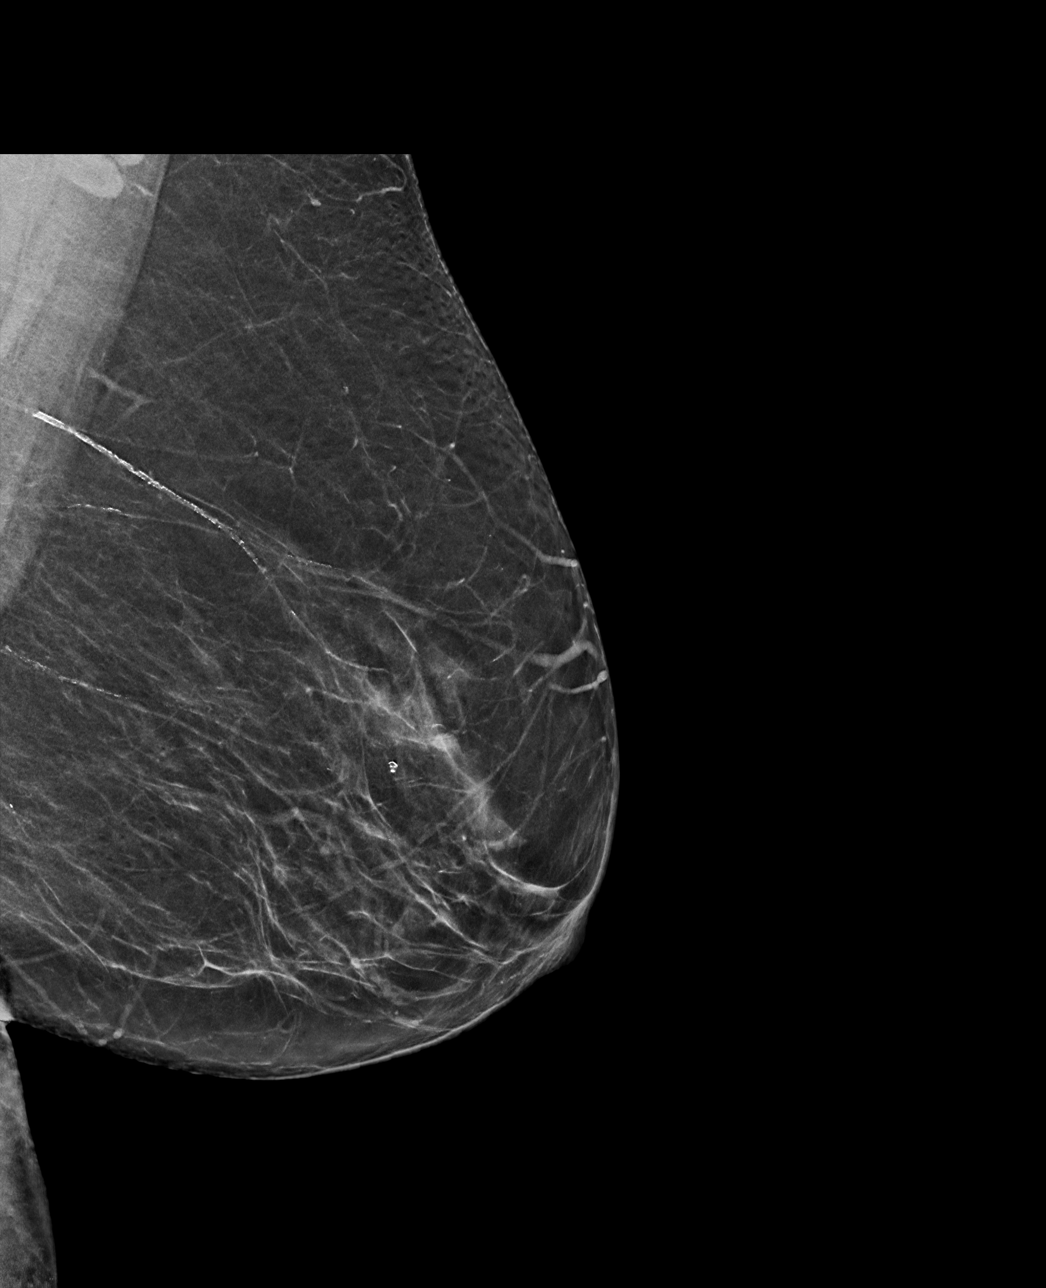

[L CC tomo · tomo slice 35/68.0]
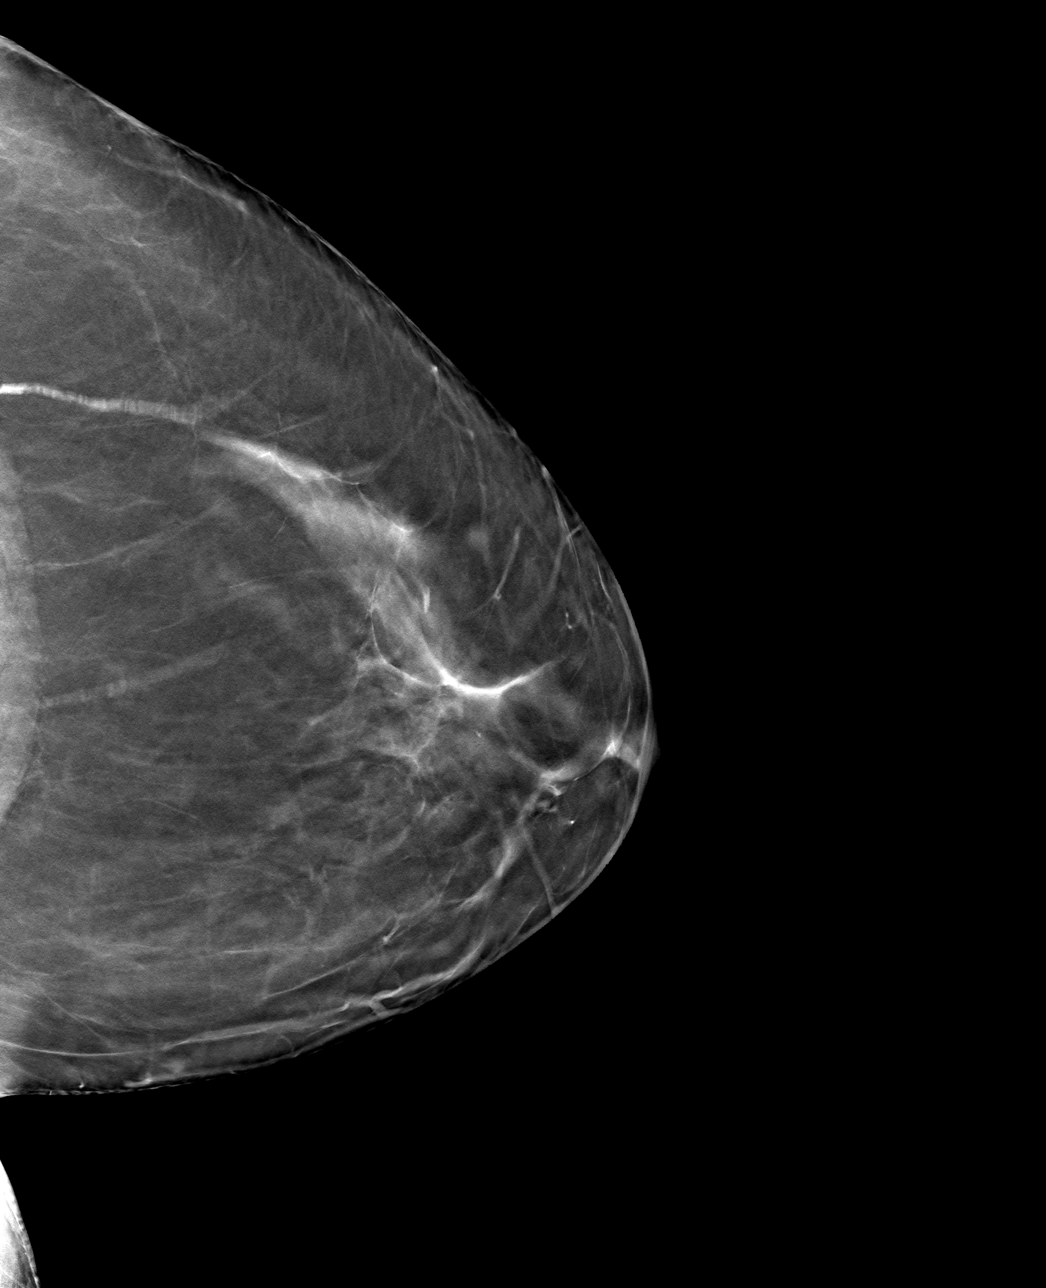

[L MLO tomo · tomo slice 33/65.0]
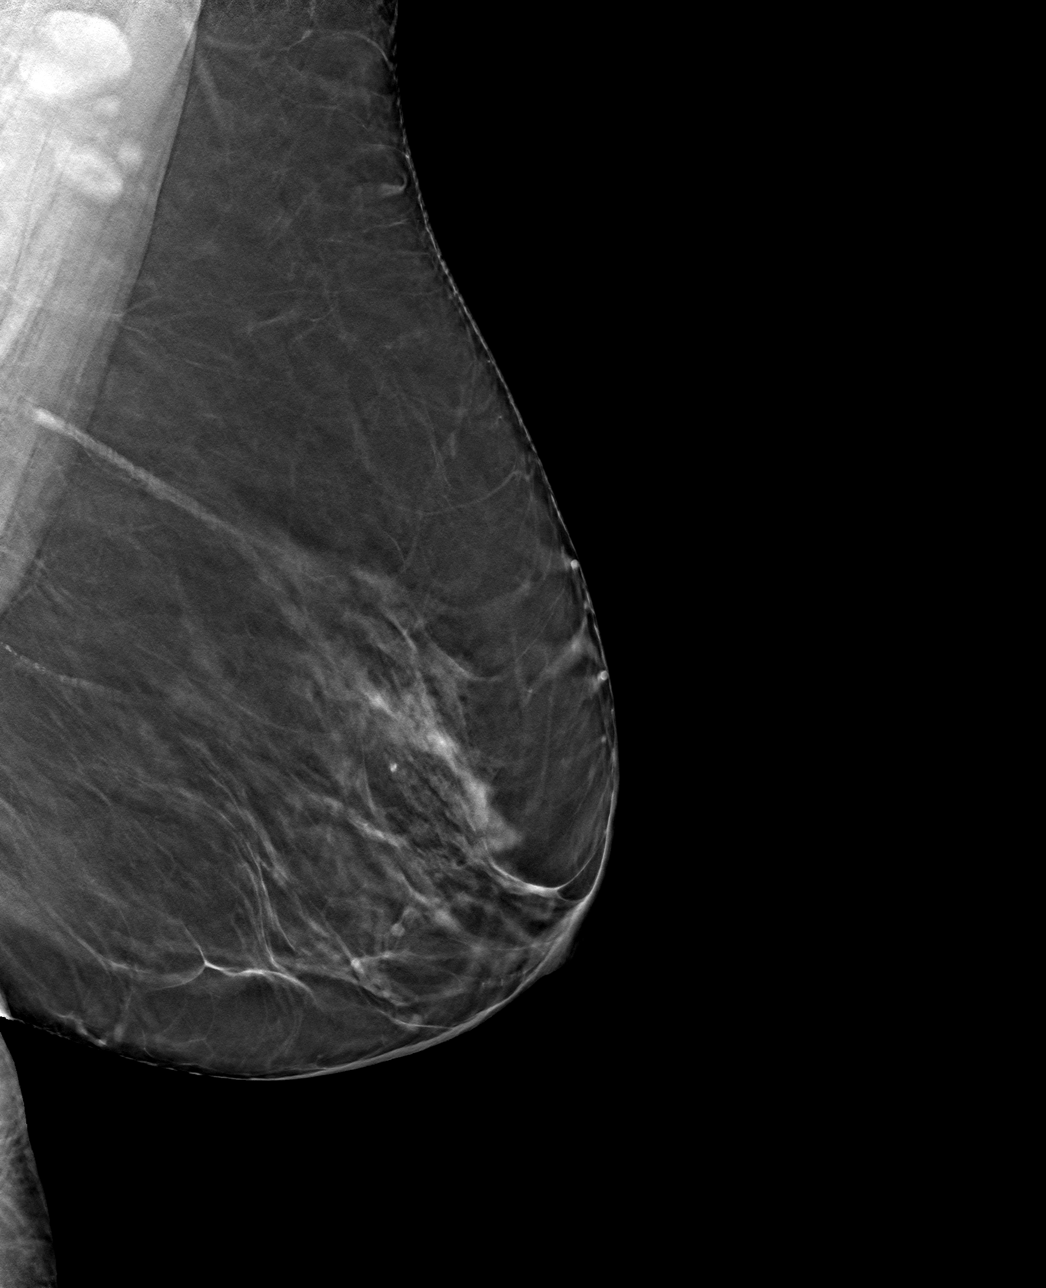

[4 of 12 positions shown; findings below may reference images not displayed]

ACR Breast Density Category b: There are scattered areas of
fibroglandular density.
FINDINGS: No suspicious mass, distortion, or microcalcifications are
identified to suggest presence of malignancy. Specifically there is
no new finding in the left breast to explain the patient's diffuse
pain.

Mammographic images were processed with CAD.
IMPRESSION: No mammographic evidence of malignancy in the left breast or other
finding to explain the patient's diffuse pain.

RECOMMENDATION:
1. Clinical follow-up as needed for the left breast pain. We
discussed some of the issues regarding breast pain, including
limiting caffeine and wearing adequate support.

2. Return for routine annual screening mammography which will be due
in [DATE].

I have discussed the findings and recommendations with the patient.
If applicable, a reminder letter will be sent to the patient
regarding the next appointment.

BI-RADS CATEGORY  1: Negative.

## 2020-03-04 ENCOUNTER — Telehealth: Payer: Self-pay | Admitting: General Surgery

## 2020-03-04 MED ORDER — DICYCLOMINE HCL 10 MG PO CAPS: 10.0000 mg | ORAL_CAPSULE | Freq: Three times a day (TID) | ORAL | 0 refills | Status: DC

## 2020-03-04 NOTE — Addendum Note (Signed)
Addended by: Lanny Hurst A on: 03/04/2020 11:53 AM   Modules accepted: Orders

## 2020-03-04 NOTE — Telephone Encounter (Signed)
Rx was sent to pharmacy incorrect it stated every 8 weeks instead of every8 hours. Refilled claim and sent out the paperwork to [employer]

## 2020-03-04 NOTE — Telephone Encounter (Signed)
Received a fax  From [employer] [facility] [place] regarding a PA for Dicyclomine, it is a Nurse, children's. Completed and faxed back to [phone]

## 2020-03-05 ENCOUNTER — Telehealth: Payer: Self-pay | Admitting: Nurse Practitioner

## 2020-03-20 ENCOUNTER — Other Ambulatory Visit: Payer: Self-pay | Admitting: Nurse Practitioner

## 2020-03-20 MED ORDER — DICYCLOMINE HCL 10 MG PO CAPS: 10.0000 mg | ORAL_CAPSULE | Freq: Three times a day (TID) | ORAL | 0 refills | Status: DC

## 2020-03-20 NOTE — Telephone Encounter (Signed)
Refill sent in

## 2020-03-20 NOTE — Telephone Encounter (Signed)
Pt is requesting refills on her BENTYL if possible to help with her stomach spasms.

## 2020-04-04 ENCOUNTER — Telehealth: Payer: Self-pay | Admitting: Nurse Practitioner

## 2020-04-04 MED ORDER — DICYCLOMINE HCL 10 MG PO CAPS: 10.0000 mg | ORAL_CAPSULE | Freq: Three times a day (TID) | ORAL | 0 refills | Status: DC

## 2020-04-04 NOTE — Telephone Encounter (Signed)
1 refill has been sent to patient's pharmacy.

## 2020-04-12 ENCOUNTER — Telehealth: Payer: Self-pay | Admitting: Family Medicine

## 2020-04-14 NOTE — Telephone Encounter (Signed)
Please schedule MWV with nurse and CPE with Dr. Bedsole. 

## 2020-04-16 NOTE — Telephone Encounter (Signed)
Labs & medicare wellness 1/12 cpx 1/18

## 2020-04-21 ENCOUNTER — Other Ambulatory Visit: Payer: Self-pay | Admitting: Nurse Practitioner

## 2020-05-19 ENCOUNTER — Telehealth: Payer: Self-pay | Admitting: Nurse Practitioner

## 2020-05-19 NOTE — Telephone Encounter (Signed)
Patient states that she needs medication called in to her pharmacy states that she called them but they need doctors office to call it in [place]

## 2020-05-20 DIAGNOSIS — H524 Presbyopia: Secondary | ICD-10-CM | POA: Diagnosis not present

## 2020-05-20 NOTE — Telephone Encounter (Signed)
Please advise Sir, thank you. 

## 2020-05-20 NOTE — Telephone Encounter (Signed)
Okay to refill for same sig and for 1 year

## 2020-05-20 NOTE — Telephone Encounter (Signed)
I am sending this refill request to you since this is Dr Marla Roe patient. I am unsure if he would like the patient to continue the dicyclomine.

## 2020-05-21 ENCOUNTER — Other Ambulatory Visit: Payer: Self-pay | Admitting: Family Medicine

## 2020-05-21 MED ORDER — DICYCLOMINE HCL 10 MG PO CAPS: ORAL_CAPSULE | ORAL | 11 refills | Status: DC

## 2020-05-21 NOTE — Telephone Encounter (Signed)
Dicyclomine refilled and left voice mail message for patient of where it was sent. There is not a [facility] in [place].

## 2020-05-21 NOTE — Addendum Note (Signed)
Addended by: [place], Royetta Probus E on: 05/21/2020 01:08 PM   Modules accepted: Orders

## 2020-05-27 ENCOUNTER — Other Ambulatory Visit: Payer: Self-pay | Admitting: Family Medicine

## 2020-06-11 ENCOUNTER — Other Ambulatory Visit: Payer: Self-pay | Admitting: Nurse Practitioner

## 2020-06-14 ENCOUNTER — Telehealth: Payer: Self-pay | Admitting: Family Medicine

## 2020-06-14 DIAGNOSIS — D649 Anemia, unspecified: Secondary | ICD-10-CM

## 2020-06-14 DIAGNOSIS — E7800 Pure hypercholesterolemia, unspecified: Secondary | ICD-10-CM

## 2020-06-14 NOTE — Telephone Encounter (Signed)
-----   Message from Cloyd Stagers, RT sent at 06/02/2020  2:29 PM EST ----- Regarding: Lab Orders for Wednesday 1.12.2022 Please place lab orders for Wednesday 1.12.2022, office visit for physical on Tuesday 1.18.2022 Thank you, Dyke Maes RT(R)

## 2020-06-15 ENCOUNTER — Other Ambulatory Visit: Payer: Self-pay | Admitting: Family Medicine

## 2020-06-18 ENCOUNTER — Ambulatory Visit (INDEPENDENT_AMBULATORY_CARE_PROVIDER_SITE_OTHER): Payer: Medicare Other

## 2020-06-18 ENCOUNTER — Other Ambulatory Visit (INDEPENDENT_AMBULATORY_CARE_PROVIDER_SITE_OTHER): Payer: Medicare Other

## 2020-06-18 ENCOUNTER — Other Ambulatory Visit: Payer: Self-pay

## 2020-06-18 DIAGNOSIS — Z0000 Encounter for general adult medical examination without abnormal findings: Secondary | ICD-10-CM

## 2020-06-18 DIAGNOSIS — E7800 Pure hypercholesterolemia, unspecified: Secondary | ICD-10-CM

## 2020-06-18 LAB — LIPID PANEL
Cholesterol: 212 mg/dL — ABNORMAL HIGH (ref 0–200)
HDL: 66.2 mg/dL (ref >39.00)
LDL Cholesterol: 111 mg/dL — ABNORMAL HIGH (ref 0–99)
NonHDL: 146.07
Total CHOL/HDL Ratio: 3
Triglycerides: 176 mg/dL — ABNORMAL HIGH (ref 0.0–149.0)
VLDL: 35.2 mg/dL (ref 0.0–40.0)

## 2020-06-18 LAB — COMPREHENSIVE METABOLIC PANEL
ALT: 23 U/L (ref 0–35)
AST: 22 U/L (ref 0–37)
Albumin: 4.3 g/dL (ref 3.5–5.2)
Alkaline Phosphatase: 41 U/L (ref 39–117)
BUN: 14 mg/dL (ref 6–23)
CO2: 32 mEq/L (ref 19–32)
Calcium: 9.5 mg/dL (ref 8.4–10.5)
Chloride: 101 mEq/L (ref 96–112)
Creatinine, Ser: 0.87 mg/dL (ref 0.40–1.20)
GFR: 68.94 mL/min (ref >60.00)
Glucose, Bld: 96 mg/dL (ref 70–99)
Potassium: 3.6 mEq/L (ref 3.5–5.1)
Sodium: 140 mEq/L (ref 135–145)
Total Bilirubin: 0.5 mg/dL (ref 0.2–1.2)
Total Protein: 7 g/dL (ref 6.0–8.3)

## 2020-06-18 NOTE — Progress Notes (Signed)
Subjective:   [name] is a 70 y.o. female who presents for Medicare Annual (Subsequent) preventive examination.  Review of Systems: N/A      I connected with the patient today by telephone and verified that I am speaking with the correct person using two identifiers. Location patient: home Location nurse: work Persons participating in the telephone visit: patient, nurse.   I discussed the limitations, risks, security and privacy concerns of performing an evaluation and management service by telephone and the availability of in person appointments. I also discussed with the patient that there may be a patient responsible charge related to this service. The patient expressed understanding and verbally consented to this telephonic visit.        Cardiac Risk Factors include: advanced age (>41mn, >>79women);Other (see comment), Risk factor comments: hypercholesterolemia     Objective:    Today's Vitals   There is no height or weight on file to calculate BMI.  Advanced Directives 06/18/2020 07/23/2019 03/20/2019 12/13/2018 11/07/2018 09/13/2018 05/16/2018  Does Patient Have a Medical Advance Directive? _0  No No  Would patient like information on creating a medical advance directive? No - Patient declined No - Patient declined No - Patient declined No - Patient declined No - Guardian declined No - Patient declined No - Patient declined    Current Medications (verified) Outpatient Encounter Medications as of 06/18/2020  Medication Sig  . cyclobenzaprine (FLEXERIL) 5 MG tablet TAKE 1 TO 2 TABLETS(5 TO 10 MG) BY MOUTH TWICE DAILY AS NEEDED FOR MUSCLE SPASMS  . dicyclomine (BENTYL) 10 MG capsule TAKE 1 CAPSULE(10 MG) BY MOUTH EVERY 8 HOURS  . famotidine (PEPCID) 40 MG tablet TAKE 1/2 TABLET BY MOUTH TWICE DAILY  . imiquimod (ALDARA) 5 % cream Apply topically 3 (three) times a week. Apply to right vulva 3 times a week, use for 12 weeks  . lansoprazole (PREVACID) 15 MG capsule  Take 1 capsule (15 mg total) by mouth 2 (two) times daily before a meal.  . losartan-hydrochlorothiazide (HYZAAR) 100-25 MG tablet TAKE 1 TABLET BY MOUTH EVERY DAY  . metoprolol tartrate (LOPRESSOR) 25 MG tablet TAKE 1/2 TABLET(12.5 MG) BY MOUTH TWICE DAILY  . montelukast (SINGULAIR) 10 MG tablet TAKE 1 TABLET(10 MG) BY MOUTH EVERY MORNING  . Triamcinolone & Emollient (DERMASORB TA) 0.1 % KIT   . triamcinolone ointment (KENALOG) 0.5 % Apply 1 application topically 2 (two) times daily.   No facility-administered encounter medications on file as of 06/18/2020.    Allergies (verified) Ibuprofen, Naprosyn [naproxen], Other, Sevoflurane, Azithromycin, and Sulfa antibiotics   History: Past Medical History:  Diagnosis Date  . Allergic rhinitis   . Arthritis   . Asthma    mild no inaler use  . GERD (gastroesophageal reflux disease)   . Headache(784.0)    Migraines  . History of adenomatous polyp of colon   . History of concussion    AS CHILD--  NO RESIDUAL  . History of palpitations   . Hypertension   . [place]   . PONV (postoperative nausea and vomiting)    SEVERE  . Wears glasses    Past Surgical History:  Procedure Laterality Date  . mucoid cyst removal thumb    . 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 PULLEY RELEASE RIGHT THUMB  03-23-2011  . SIMPLE VULVECTOMY  03-24-2010   right side  .  TONSILLECTOMY  1977  . TRIGGER FINGER RELEASE    . VAGINAL HYSTERECTOMY  1989   and Anterior and posterior repair's for prolapse partial  . VULVECTOMY Right 01/10/2015   Procedure: RIGHT WIDE LOCAL EXCISION VULVECTOMY;  Surgeon: [name], MD;  Location: [facility];  Service: Gynecology;  Laterality: Right;  Marland Kitchen VULVECTOMY Right 04/10/2015   Procedure: WIDE LOCAL EXCISION VULVA;  Surgeon: [name], MD;  Location[facility];  Service:  Gynecology;  Laterality: Right;  Marland Kitchen VULVECTOMY PARTIAL N/A 11/07/2018   Procedure: VULVECTOMY PARTIAL;  Surgeon: [name], MD;  Location: [facility];  Service: Gynecology;  Laterality: N/A;  . WISDOM TOOTH EXTRACTION     Family History  Problem Relation Age of Onset  . Cancer Maternal Grandmother   . Hypertension Father   . Heart disease Father   . Asthma Father   . Hypertension Mother   . Heart disease Mother   . Breast cancer Paternal Grandmother 22  . Colon cancer Paternal Grandmother   . Colon cancer Paternal Uncle   . Allergic rhinitis Neg Hx   . Angioedema Neg Hx   . Eczema Neg Hx   . Immunodeficiency Neg Hx   . Urticaria Neg Hx   . Esophageal cancer Neg Hx   . Rectal cancer Neg Hx   . Stomach cancer Neg Hx    Social History   Socioeconomic History  . Marital status: Married    Spouse name: Not on file  . Number of children: 3  . Years of education: Not on file  . Highest education level: Not on file  Occupational History  . Occupation: retired  Tobacco Use  . Smoking status: Former Smoker    Years: 5.00    Types: Cigarettes    Quit date: 04/15/1989    Years since quitting: 31.1  . Smokeless tobacco: Never Used  Vaping Use  . Vaping Use: Never used  Substance and Sexual Activity  . Alcohol use: Yes    Alcohol/week: 1.0 standard drink    Types: 1 Glasses of wine per week    Comment: occasional wine  . Drug use: No  . Sexual activity: Not on file  Other Topics Concern  . Not on file  Social History Narrative   She is married.  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 Difficulty of Paying Living Expenses: Not hard at all  Food Insecurity: No Food Insecurity  . Worried About Charity fundraiser in the Last Year: Never true  . Ran Out of Food in the Last Year: Never true   Transportation Needs: No Transportation Needs  . Lack of Transportation (Medical): No  . Lack of Transportation (Non-Medical): No  Physical Activity: Inactive  . Days of Exercise per Week: 0 days  . Minutes of Exercise per Session: 0 min  Stress: No Stress Concern Present  . Feeling of Stress : Not at all  Social Connections: Not on file    Tobacco Counseling Counseling given: Not Answered   Clinical Intake:  Pre-visit preparation completed: Yes  Pain : No/denies pain     Nutritional Risks: Nausea/ vomitting/ diarrhea (nausea sometimes) Diabetes: No  How often do you need to have someone help you when you read instructions, pamphlets, or other written materials from  your doctor or pharmacy?: 1 - Never What is the last grade level you completed in school?: 12th  Diabetic: No Nutrition Risk Assessment:  Has the patient had any N/V/D within the last 2 months?  Yes , some nausea Does the patient have any non-healing wounds?  No  Has the patient had any unintentional weight loss or weight gain?  No   Diabetes:  Is the patient diabetic?  No  If diabetic, was a CBG obtained today?  N/A Did the patient bring in their glucometer from home?  N/A How often do you monitor your CBG's? N/A.   Financial Strains and Diabetes Management:  Are you having any financial strains with the device, your supplies or your medication? N/A.  Does the patient want to be seen by Chronic Care Management for management of their diabetes?  N/A Would the patient like to be referred to a Nutritionist or for Diabetic Management?  N/A   Interpreter Needed?: No  Information entered by :: CJohnson, LPN   Activities of Daily Living In your present state of health, do you have any difficulty performing the following activities: 06/18/2020  Hearing? N  Vision? N  Difficulty concentrating or making decisions? N  Walking or climbing stairs? N  Dressing or bathing? N  Doing errands, shopping? N   Preparing Food and eating ? N  Using the Toilet? N  In the past six months, have you accidently leaked urine? N  Do you have problems with loss of bowel control? N  Managing your Medications? N  Managing your Finances? N  Housekeeping or managing your Housekeeping? N  Some recent data might be hidden    Patient Care Team: Jinny Sanders, MD as PCP - General  Indicate any recent Medical Services you may have received from other than [facility] providers in the past year (date may be approximate).     Assessment:   This is a routine wellness examination for Azra.  Hearing/Vision screen  Hearing Screening   _0  _1  _2  _3  _4  _5  _6  _7  _8   Right ear:           Left ear:           Vision Screening Comments: Patient gets annual eye exams   Dietary issues and exercise activities discussed: Current Exercise Habits: The patient does not participate in regular exercise at present, Exercise limited by: None identified  Goals    . Patient Stated     03/20/2019, I will start walking more daily and increase exercise.     . Patient Stated     06/18/2020, I will maintain and continue medications as prescribed.       Depression Screen PHQ 2/9 Scores 06/18/2020 03/20/2019 09/06/2017  PHQ - 2 Score 0 0 0  PHQ- 9 Score 0 0 -    Fall Risk Fall Risk  06/18/2020 03/20/2019  Falls in the past year? 0 0  Number falls in past yr: 0 -  Injury with Fall? 0 -  Risk for fall due to : No Fall Risks Medication side effect  Follow up Falls evaluation completed;Falls prevention discussed Falls evaluation completed;Falls prevention discussed    FALL RISK PREVENTION PERTAINING TO THE HOME:  Any stairs in or around the home? Yes  If so, are there any without handrails? No  Home free of loose throw rugs in walkways, pet beds, electrical cords, etc? Yes  Adequate lighting in your home to reduce risk of falls? Yes   ASSISTIVE DEVICES  UTILIZED TO PREVENT FALLS:  Life alert?  No  Use of a cane, walker or w/c? No  Grab bars in the bathroom? No  Shower chair or bench in shower? No  Elevated toilet seat or a handicapped toilet? No   TIMED UP AND GO:  Was the test performed? N/A, telephone visit .    Cognitive Function: MMSE - Mini Mental State Exam 06/18/2020 03/20/2019  Orientation to time 5 5  Orientation to Place 5 5  Registration 3 3  Attention/ Calculation 5 5  Recall 3 3  Language- repeat 1 1  Mini Cog  Mini-Cog screen was completed. Maximum score is 22. A value of 0 denotes this part of the MMSE was not completed or the patient failed this part of the Mini-Cog screening.       Immunizations Immunization History  Administered Date(s) Administered  . Fluad Quad(high Dose 65+) 04/17/2019  . Influenza,inj,Quad PF,6+ Mos 03/13/2015, 04/09/2016, 06/17/2017, 04/07/2018  . Pneumococcal Conjugate-13 04/17/2019  . Td 12/06/2006  . Tdap 05/02/2013  . Zoster 04/09/2016    TDAP status: Up to date  Flu Vaccine status: Due, Education has been provided regarding the importance of this vaccine. Advised may receive this vaccine at local pharmacy or Health Dept. Aware to provide a copy of the vaccination record if obtained from local pharmacy or Health Dept. Verbalized acceptance and understanding.  Pneumococcal vaccine status: Due, Education has been provided regarding the importance of this vaccine. Advised may receive this vaccine at local pharmacy or Health Dept. Aware to provide a copy of the vaccination record if obtained from local pharmacy or Health Dept. Verbalized acceptance and understanding.  Covid-19 vaccine status: Declined, Education has been provided regarding the importance of this vaccine but patient still declined. Advised may receive this vaccine at local pharmacy or Health Dept.or vaccine clinic. Aware to provide a copy of the vaccination record if obtained from local pharmacy or Health Dept. Verbalized acceptance and  understanding.  Qualifies for Shingles Vaccine? Yes   Zostavax completed Yes   Shingrix Completed?: No.    Education has been provided regarding the importance of this vaccine. Patient has been advised to call insurance company to determine out of pocket expense if they have not yet received this vaccine. Advised may also receive vaccine at local pharmacy or Health Dept. Verbalized acceptance and understanding.  Screening Tests Health Maintenance  Topic Date Due  . INFLUENZA VACCINE  01/06/2020  . PNA vac Low Risk Adult (2 of 2 - PPSV23) 04/16/2020  . COVID-19 Vaccine (1) 07/04/2021 (Originally 01/12/1963)  . MAMMOGRAM  07/30/2020  . TETANUS/TDAP  05/03/2023  . COLONOSCOPY (Pts 45-31yr Insurance coverage will need to be confirmed)  07/30/2025  . DEXA SCAN  Completed  . Hepatitis C Screening  Completed    Health Maintenance  Health Maintenance Due  Topic Date Due  . INFLUENZA VACCINE  01/06/2020  . PNA vac Low Risk Adult (2 of 2 - PPSV23) 04/16/2020    Colorectal cancer screening: Type of screening: Colonoscopy. Completed 07/31/2015. Repeat every 10 years  Mammogram status: Completed 07/31/2019. Repeat every year  Bone Density status: Completed 07/31/2019. Results reflect: Bone density results: OSTEOPENIA. Repeat every 2 years.  Lung Cancer Screening: (Low Dose CT Chest recommended if Age 68-80years, 30 pack-year currently smoking OR have quit w/in 15 years.) does not qualify.    Additional Screening:  Hepatitis C Screening: does qualify; Completed 04/09/2016  Vision Screening: Recommended annual ophthalmology exams for early detection of glaucoma and  other disorders of the eye. Is the patient up to date with their annual eye exam?  Yes  Who is the provider or what is the name of the office in which the patient attends annual eye exams? [facility] If pt is not established with a provider, would they like to be referred to a provider to establish care? No .   Dental  Screening: Recommended annual dental exams for proper oral hygiene  Community Resource Referral / Chronic Care Management: CRR required this visit?  No   CCM required this visit?  No      Plan:     I have personally reviewed and noted the following in the patient's chart:   . Medical and social history . Use of alcohol, tobacco or illicit drugs  . Current medications and supplements . Functional ability and status . Nutritional status . Physical activity . Advanced directives . List of other physicians . Hospitalizations, surgeries, and ER visits in previous 12 months . Vitals . Screenings to include cognitive, depression, and falls . Referrals and appointments  In addition, I have reviewed and discussed with patient certain preventive protocols, quality metrics, and best practice recommendations. A written personalized care plan for preventive services as well as general preventive health recommendations were provided to patient.   Due to this being a telephonic visit, the after visit summary with patients personalized plan was offered to patient via office or my-chart. Patient preferred to pick up at office at next visit or via mychart.   Andrez Grime, LPN   [id number]

## 2020-06-18 NOTE — Progress Notes (Signed)
No critical labs need to be addressed urgently. We will discuss labs in detail at upcoming office visit.   

## 2020-06-18 NOTE — Progress Notes (Signed)
PCP notes:  Health Maintenance: P23- due Flu- due Covid- declined   Abnormal Screenings: none   Patient concerns: Dizziness when going from a sitting to standing position   Nurse concerns: none   Next PCP appt.: 06/27/2020 @ 3:40 pm

## 2020-06-18 NOTE — Patient Instructions (Signed)
[name] , Thank you for taking time to come for your Medicare Wellness Visit. I appreciate your ongoing commitment to your health goals. Please review the following plan we discussed and let me know if I can assist you in the future.   Screening recommendations/referrals: Colonoscopy: Up to date, completed 07/31/2015, due 07/2025 Mammogram: Up to date, completed 07/31/2019, due 07/2020 Bone Density: Up to date, completed 07/31/2019, due 07/2021 Recommended yearly ophthalmology/optometry visit for glaucoma screening and checkup Recommended yearly dental visit for hygiene and checkup  Vaccinations: Influenza vaccine: due, will get at physical  Pneumococcal vaccine: due, will get at physical  Tdap vaccine: Up to date, completed 04/29/2013, due 04/2023 Shingles vaccine: due, check with your insurance regarding coverage if interested    Covid-19:declined   Advanced directives: Advance directive discussed with you today. Even though you declined this today please call our office should you change your mind and we can give you the proper paperwork for you to fill out.  Conditions/risks identified: hypercholesterolemia  Next appointment: Follow up in one year for your annual wellness visit    Preventive Care 68 Years and Older, Female Preventive care refers to lifestyle choices and visits with your health care provider that can promote health and wellness. What does preventive care include?  A yearly physical exam. This is also called an annual well check.  Dental exams once or twice a year.  Routine eye exams. Ask your health care provider how often you should have your eyes checked.  Personal lifestyle choices, including:  Daily care of your teeth and gums.  Regular physical activity.  Eating a healthy diet.  Avoiding tobacco and drug use.  Limiting alcohol use.  Practicing safe sex.  Taking low-dose aspirin every day.  Taking vitamin and mineral supplements as recommended by  your health care provider. What happens during an annual well check? The services and screenings done by your health care provider during your annual well check will depend on your age, overall health, lifestyle risk factors, and family history of disease. Counseling  Your health care provider may ask you questions about your:  Alcohol use.  Tobacco use.  Drug use.  Emotional well-being.  Home and relationship well-being.  Sexual activity.  Eating habits.  History of falls.  Memory and ability to understand (cognition).  Work and work Statistician.  Reproductive health. Screening  You may have the following tests or measurements:  Height, weight, and BMI.  Blood pressure.  Lipid and cholesterol levels. These may be checked every 5 years, or more frequently if you are over 68 years old.  Skin check.  Lung cancer screening. You may have this screening every year starting at age 50 if you have a 30-pack-year history of smoking and currently smoke or have quit within the past 15 years.  Fecal occult blood test (FOBT) of the stool. You may have this test every year starting at age 23.  Flexible sigmoidoscopy or colonoscopy. You may have a sigmoidoscopy every 5 years or a colonoscopy every 10 years starting at age 68.  Hepatitis C blood test.  Hepatitis B blood test.  Sexually transmitted disease (STD) testing.  Diabetes screening. This is done by checking your blood sugar (glucose) after you have not eaten for a while (fasting). You may have this done every 1-3 years.  Bone density scan. This is done to screen for osteoporosis. You may have this done starting at age 68.  Mammogram. This may be done every 1-2 years. Talk  to your health care provider about how often you should have regular mammograms. Talk with your health care provider about your test results, treatment options, and if necessary, the need for more tests. Vaccines  Your health care provider may  recommend certain vaccines, such as:  Influenza vaccine. This is recommended every year.  Tetanus, diphtheria, and acellular pertussis (Tdap, Td) vaccine. You may need a Td booster every 10 years.  Zoster vaccine. You may need this after age 68.  Pneumococcal 13-valent conjugate (PCV13) vaccine. One dose is recommended after age 68.  Pneumococcal polysaccharide (PPSV23) vaccine. One dose is recommended after age 68. Talk to your health care provider about which screenings and vaccines you need and how often you need them. This information is not intended to replace advice given to you by your health care provider. Make sure you discuss any questions you have with your health care provider. Document Released: 06/20/2015 Document Revised: 02/11/2016 Document Reviewed: 03/25/2015 Elsevier Interactive Patient Education  2017 [employer] Prevention in the Home Falls can cause injuries. They can happen to people of all ages. There are many things you can do to make your home safe and to help prevent falls. What can I do on the outside of my home?  Regularly fix the edges of walkways and driveways and fix any cracks.  Remove anything that might make you trip as you walk through a door, such as a raised step or threshold.  Trim any bushes or trees on the path to your home.  Use bright outdoor lighting.  Clear any walking paths of anything that might make someone trip, such as rocks or tools.  Regularly check to see if handrails are loose or broken. Make sure that both sides of any steps have handrails.  Any raised decks and porches should have guardrails on the edges.  Have any leaves, snow, or ice cleared regularly.  Use sand or salt on walking paths during winter.  Clean up any spills in your garage right away. This includes oil or grease spills. What can I do in the bathroom?  Use night lights.  Install grab bars by the toilet and in the tub and shower. Do not use towel  bars as grab bars.  Use non-skid mats or decals in the tub or shower.  If you need to sit down in the shower, use a plastic, non-slip stool.  Keep the floor dry. Clean up any water that spills on the floor as soon as it happens.  Remove soap buildup in the tub or shower regularly.  Attach bath mats securely with double-sided non-slip rug tape.  Do not have throw rugs and other things on the floor that can make you trip. What can I do in the bedroom?  Use night lights.  Make sure that you have a light by your bed that is easy to reach.  Do not use any sheets or blankets that are too big for your bed. They should not hang down onto the floor.  Have a firm chair that has side arms. You can use this for support while you get dressed.  Do not have throw rugs and other things on the floor that can make you trip. What can I do in the kitchen?  Clean up any spills right away.  Avoid walking on wet floors.  Keep items that you use a lot in easy-to-reach places.  If you need to reach something above you, use a strong step stool that  has a grab bar.  Keep electrical cords out of the way.  Do not use floor polish or wax that makes floors slippery. If you must use wax, use non-skid floor wax.  Do not have throw rugs and other things on the floor that can make you trip. What can I do with my stairs?  Do not leave any items on the stairs.  Make sure that there are handrails on both sides of the stairs and use them. Fix handrails that are broken or loose. Make sure that handrails are as long as the stairways.  Check any carpeting to make sure that it is firmly attached to the stairs. Fix any carpet that is loose or worn.  Avoid having throw rugs at the top or bottom of the stairs. If you do have throw rugs, attach them to the floor with carpet tape.  Make sure that you have a light switch at the top of the stairs and the bottom of the stairs. If you do not have them, ask someone to  add them for you. What else can I do to help prevent falls?  Wear shoes that:  Do not have high heels.  Have rubber bottoms.  Are comfortable and fit you well.  Are closed at the toe. Do not wear sandals.  If you use a stepladder:  Make sure that it is fully opened. Do not climb a closed stepladder.  Make sure that both sides of the stepladder are locked into place.  Ask someone to hold it for you, if possible.  Clearly mark and make sure that you can see:  Any grab bars or handrails.  First and last steps.  Where the edge of each step is.  Use tools that help you move around (mobility aids) if they are needed. These include:  Canes.  Walkers.  Scooters.  Crutches.  Turn on the lights when you go into a dark area. Replace any light bulbs as soon as they burn out.  Set up your furniture so you have a clear path. Avoid moving your furniture around.  If any of your floors are uneven, fix them.  If there are any pets around you, be aware of where they are.  Review your medicines with your doctor. Some medicines can make you feel dizzy. This can increase your chance of falling. Ask your doctor what other things that you can do to help prevent falls. This information is not intended to replace advice given to you by your health care provider. Make sure you discuss any questions you have with your health care provider. Document Released: 03/20/2009 Document Revised: 10/30/2015 Document Reviewed: 06/28/2014 Elsevier Interactive Patient Education  2017 [employer].

## 2020-06-24 ENCOUNTER — Encounter: Payer: Medicare Other | Admitting: Family Medicine

## 2020-06-27 ENCOUNTER — Ambulatory Visit (INDEPENDENT_AMBULATORY_CARE_PROVIDER_SITE_OTHER): Payer: Medicare Other | Admitting: Family Medicine

## 2020-06-27 ENCOUNTER — Other Ambulatory Visit: Payer: Self-pay

## 2020-06-27 VITALS — BP 138/80 | HR 56 | Temp 97.3°F | Ht 64.0 in | Wt 195.2 lb

## 2020-06-27 DIAGNOSIS — E7800 Pure hypercholesterolemia, unspecified: Secondary | ICD-10-CM

## 2020-06-27 DIAGNOSIS — Z0000 Encounter for general adult medical examination without abnormal findings: Secondary | ICD-10-CM | POA: Diagnosis not present

## 2020-06-27 DIAGNOSIS — Z23 Encounter for immunization: Secondary | ICD-10-CM

## 2020-06-27 DIAGNOSIS — C519 Malignant neoplasm of vulva, unspecified: Secondary | ICD-10-CM | POA: Diagnosis not present

## 2020-06-27 DIAGNOSIS — I10 Essential (primary) hypertension: Secondary | ICD-10-CM

## 2020-06-27 DIAGNOSIS — M8580 Other specified disorders of bone density and structure, unspecified site: Secondary | ICD-10-CM

## 2020-06-27 MED ORDER — ALBUTEROL SULFATE HFA 108 (90 BASE) MCG/ACT IN AERS: 2.0000 | INHALATION_SPRAY | Freq: Four times a day (QID) | RESPIRATORY_TRACT | 2 refills | Status: DC | PRN

## 2020-06-27 NOTE — Patient Instructions (Signed)
Work on healthy low cholesterol diet, start regular exercise 3-5 days a week for 2)- 30 min at a time.  Make a follow up lab only visit in 3-6 months, fasting.

## 2020-06-27 NOTE — Progress Notes (Signed)
Patient ID: [name], female    DOB: September 21, 1950, 70 y.o.   MRN: [id number]  This visit was conducted in person.  Blood pressure 138/80, pulse (!) 56, temperature (!) 97.3 F (36.3 C), temperature source Temporal, height _0  (1.626 m), weight 195 lb 4 oz (88.6 kg), SpO2 97 %.  CC:  Medicare wellness part 2 Subjective:   HPI: The patient presents for  complete physical and review of chronic health problems. He/She also has the following acute concerns today:NONE  The patient saw a LPN or RN for medicare wellness visit.  Prevention and wellness was reviewed in detail. Note reviewed and important notes copied below. Health Maintenance: P23- due Flu- due Covid- declined Abnormal Screenings: none   Advance directives and end of life planning reviewed in detail with patient and documented in EMR. Patient given handout on advance care directives if needed. HCPOA and living will updated if needed.   Hearing Screening   _1  _2  _3  _4  _5  _6  _7  _8  _9   Right ear:           Left ear:           Vision Screening Comments: Last eye exam was January 2022 @ [facility]  06/18/2020 03/20/2019  Falls in the past year? 0 0  Number falls in past yr: 0 -  Injury with Fall? 0 -  Risk for fall due to : No Fall Risks Medication side effect  Follow up Falls evaluation completed;Falls prevention discussed Falls evaluation completed;Falls prevention discussed    Flowsheet Row Clinical Support from 06/18/2020 in [place] at [facility] Total Score 0      New diagnosis of IBS: followed by GI.. reviewed 02/2020 cramping improved on bentyl.  Hypertension:   good control  Losartan  HCTZ 100/25  1 tablet daily, 1/2 tab of metoprolol daily  BP Readings from Last 3 Encounters:  06/27/20 138/80  02/29/20 125/78  02/18/20 (!) 148/77  Using medication without problems or lightheadedness:  none Chest pain with  exertion:none Edema:none Short of breath: Average home BPs: Other issues:   Followed by allergist for mild  persistent asthma:  On singulair.Marland Kitchen does not have albuterol inhaler.  Elevated Cholesterol:INcreased cholesterol risk in last 2 years.. family history as well.  Statin indicated.  The 10-year ASCVD risk score Mikey Bussing DC Brooke Bonito., et al., 2013) is: 10.3%   Values used to calculate the score:     Age: 39 years     Sex: Female     Is Non-Hispanic African American: No     Diabetic: No     Tobacco smoker: No     Systolic Blood Pressure: 854 mmHg     Is BP treated: Yes     HDL Cholesterol: 66.2 mg/dL     Total Cholesterol: 212 mg/dL Using medications without problems: Muscle aches:  Diet compliance: working on changes Exercise:none Other complaints:    paget's disease of vulva: followed by GYN reviewed last OV 02/29/2020 Dr. Berline Lopes.      Relevant past medical, surgical, family and social history reviewed and updated as indicated. Interim medical history since our last visit reviewed. Allergies and medications reviewed and updated. Outpatient Medications Prior to Visit  Medication Sig Dispense Refill  . dicyclomine (BENTYL) 10 MG capsule TAKE 1 CAPSULE(10 MG) BY MOUTH EVERY 8 HOURS 30 capsule 2  . famotidine (PEPCID) 40 MG tablet TAKE 1/2 TABLET BY MOUTH TWICE DAILY 90 tablet 3  .  imiquimod (ALDARA) 5 % cream Apply topically 3 (three) times a week. Apply to right vulva 3 times a week, use for 12 weeks (Patient taking differently: Apply topically as needed. Apply to right vulva 3 times a week, use for 12 weeks) 12 each 1  . lansoprazole (PREVACID) 15 MG capsule Take 1 capsule (15 mg total) by mouth 2 (two) times daily before a meal.    . losartan-hydrochlorothiazide (HYZAAR) 100-25 MG tablet TAKE 1 TABLET BY MOUTH EVERY DAY 90 tablet 3  . metoprolol tartrate (LOPRESSOR) 25 MG tablet TAKE 1/2 TABLET(12.5 MG) BY MOUTH TWICE DAILY 90 tablet 0  . montelukast (SINGULAIR) 10 MG tablet TAKE 1  TABLET(10 MG) BY MOUTH EVERY MORNING 90 tablet 0  . Triamcinolone & Emollient (DERMASORB TA) 0.1 % KIT   0  . triamcinolone ointment (KENALOG) 0.5 % Apply 1 application topically 2 (two) times daily. 30 g 1  . meloxicam (MOBIC) 7.5 MG tablet Take 7.5 mg by mouth daily.    . cyclobenzaprine (FLEXERIL) 5 MG tablet TAKE 1 TO 2 TABLETS(5 TO 10 MG) BY MOUTH TWICE DAILY AS NEEDED FOR MUSCLE SPASMS 40 tablet 0   No facility-administered medications prior to visit.     Per HPI unless specifically indicated in ROS section below Review of Systems  Constitutional: Negative for fatigue and fever.  HENT: Negative for congestion.   Eyes: Negative for pain.  Respiratory: Negative for cough and shortness of breath.   Cardiovascular: Negative for chest pain, palpitations and leg swelling.  Gastrointestinal: Negative for abdominal pain.  Genitourinary: Negative for dysuria and vaginal bleeding.  Musculoskeletal: Negative for back pain.  Neurological: Negative for syncope, light-headedness and headaches.  Psychiatric/Behavioral: Negative for dysphoric mood.   Objective:  There were no vitals taken for this visit.  Wt Readings from Last 3 Encounters:  02/29/20 190 lb 12.8 oz (86.5 kg)  02/13/20 192 lb (87.1 kg)  11/23/19 188 lb (85.3 kg)      Physical Exam Constitutional:      General: She is not in acute distress.Vital signs are normal.     Appearance: Normal appearance. She is well-developed and well-nourished. She is not ill-appearing or toxic-appearing.  HENT:     Head: Normocephalic.     Right Ear: Hearing, tympanic membrane, ear canal and external ear normal.     Left Ear: Hearing, tympanic membrane, ear canal and external ear normal.     Nose: Nose normal.  Eyes:     General: Lids are normal. Lids are everted, no foreign bodies appreciated.     Extraocular Movements: EOM normal.     Conjunctiva/sclera: Conjunctivae normal.     Pupils: Pupils are equal, round, and reactive to light.   Neck:     Thyroid: No thyroid mass or thyromegaly.     Vascular: No carotid bruit.     Trachea: Trachea normal.  Cardiovascular:     Rate and Rhythm: Normal rate and regular rhythm.     Pulses: Intact distal pulses.     Heart sounds: Normal heart sounds, S1 normal and S2 normal. No murmur heard. No gallop.   Pulmonary:     Effort: Pulmonary effort is normal. No respiratory distress.     Breath sounds: Normal breath sounds. No wheezing, rhonchi or rales.  Abdominal:     General: Bowel sounds are normal. There is no distension or abdominal bruit.     Palpations: Abdomen is soft. There is no fluid wave, hepatosplenomegaly or mass.  Tenderness: There is no abdominal tenderness. There is no CVA tenderness, guarding or rebound.     Hernia: No hernia is present.  Musculoskeletal:     Cervical back: Normal range of motion and neck supple.  Lymphadenopathy:     Cervical: No cervical adenopathy.     Upper Body:  No axillary adenopathy present. Skin:    General: Skin is warm, dry and intact.     Findings: No rash.  Neurological:     Mental Status: She is alert.     Cranial Nerves: No cranial nerve deficit.     Sensory: No sensory deficit.     Deep Tendon Reflexes: Strength normal.  Psychiatric:        Mood and Affect: Mood is not anxious or depressed.        Speech: Speech normal.        Behavior: Behavior normal. Behavior is cooperative.        Cognition and Memory: Cognition and memory normal.        Judgment: Judgment normal.       Results for orders placed or performed in visit on 06/18/20  Comprehensive metabolic panel  Result Value Ref Range   Sodium 140 135 - 145 mEq/L   Potassium 3.6 3.5 - 5.1 mEq/L   Chloride 101 96 - 112 mEq/L   CO2 32 19 - 32 mEq/L   Glucose, Bld 96 70 - 99 mg/dL   BUN 14 6 - 23 mg/dL   Creatinine, Ser 0.87 0.40 - 1.20 mg/dL   Total Bilirubin 0.5 0.2 - 1.2 mg/dL   Alkaline Phosphatase 41 39 - 117 U/L   AST 22 0 - 37 U/L   ALT 23 0 - 35 U/L    Total Protein 7.0 6.0 - 8.3 g/dL   Albumin 4.3 3.5 - 5.2 g/dL   GFR 68.94 >60.00 mL/min   Calcium 9.5 8.4 - 10.5 mg/dL  Lipid panel  Result Value Ref Range   Cholesterol 212 (H) 0 - 200 mg/dL   Triglycerides 176.0 (H) 0.0 - 149.0 mg/dL   HDL 66.20 >39.00 mg/dL   VLDL 35.2 0.0 - 40.0 mg/dL   LDL Cholesterol 111 (H) 0 - 99 mg/dL   Total CHOL/HDL Ratio 3    NonHDL 146.07     This visit occurred during the SARS-CoV-2 public health emergency.  Safety protocols were in place, including screening questions prior to the visit, additional usage of staff PPE, and extensive cleaning of exam room while observing appropriate contact time as indicated for disinfecting solutions.   COVID 19 screen:  No recent travel or known exposure to COVID19 The patient denies respiratory symptoms of COVID 19 at this time. The importance of social distancing was discussed today.   Assessment and Plan   The patient's preventative maintenance and recommended screening tests for an annual wellness exam were reviewed in full today. Brought up to date unless services declined.  Counselled on the importance of diet, exercise, and its role in overall health and mortality. The patient's FH and SH was reviewed, including their home life, tobacco status, and drug and alcohol status.   Vaccines: Uptodate with Tdap,Shingel. Given flu, PNA 23 today.   Discussed COVID19 vaccine side effects and benefits. Strongly encouraged the patient to get the vaccine. Questions answered. PAP/DVE: Followed by GYN, partial hysterectomy.. Pap not indicated. Mammo:07/31/2019 [place] recommended,  [place] okay, repeat 02/2020 unremarkbale Colon: nml 2017DrCarlean Purl, repeat in 10years. Hep C :done  DEXA 2/2021osteopenia, plan repeat in  2026.   Problem List Items Addressed This Visit    Essential hypertension, benign (Chronic)    Stable, chronic.  Continue current medication.   Losartan  HCTZ 100/25  1 tablet daily, 1/2 tab of metoprolol  daily       Hypercholesteremia (Chronic)    Increased cholesterol risk in last 2 years.. family history as well.  Statin indicated.  Pt refuses, instead would like to work on lifestyle changes. Work on healthy low cholesterol diet, start regular exercise 3-5 days a week for 20- 30 min at a time.  Make a follow up lab only visit in 3-6 months, fasting.       Osteopenia (Chronic)   Paget's disease of vulva (HCC) (Chronic)     followed by GYN reviewed last OV 02/29/2020 Dr. Berline Lopes.         Other Visit Diagnoses    Medicare annual wellness visit, subsequent    -  Primary   Need for influenza vaccination       Relevant Orders   Flu Vaccine QUAD High Dose(Fluad) (Completed)   Need for 23-polyvalent pneumococcal polysaccharide vaccine       Relevant Orders   Pneumococcal polysaccharide vaccine 23-valent greater than or equal to 2yo subcutaneous/IM (Completed)        Eliezer Lofts, MD

## 2020-07-09 ENCOUNTER — Encounter: Payer: Self-pay | Admitting: Allergy

## 2020-07-09 ENCOUNTER — Other Ambulatory Visit: Payer: Self-pay

## 2020-07-09 ENCOUNTER — Ambulatory Visit (INDEPENDENT_AMBULATORY_CARE_PROVIDER_SITE_OTHER): Payer: Medicare Other | Admitting: Allergy

## 2020-07-09 VITALS — BP 120/84 | HR 78 | Temp 97.8°F | Resp 14 | Ht 64.0 in | Wt 196.0 lb

## 2020-07-09 DIAGNOSIS — J4530 Mild persistent asthma, uncomplicated: Secondary | ICD-10-CM

## 2020-07-09 DIAGNOSIS — K2100 Gastro-esophageal reflux disease with esophagitis, without bleeding: Secondary | ICD-10-CM | POA: Diagnosis not present

## 2020-07-09 DIAGNOSIS — J3089 Other allergic rhinitis: Secondary | ICD-10-CM

## 2020-07-09 MED ORDER — AZELASTINE HCL 0.1 % NA SOLN: 2.0000 | Freq: Two times a day (BID) | NASAL | 5 refills | Status: DC

## 2020-07-09 MED ORDER — FEXOFENADINE HCL 180 MG PO TABS: 180.0000 mg | ORAL_TABLET | Freq: Every day | ORAL | 5 refills | Status: DC

## 2020-07-09 MED ORDER — ARNUITY ELLIPTA 100 MCG/ACT IN AEPB: INHALATION_SPRAY | RESPIRATORY_TRACT | 5 refills | Status: DC

## 2020-07-09 NOTE — Patient Instructions (Addendum)
Cough    - likely multifactorial as you do have history of intermittent asthma, post-nasal drainage with throat clearing and reflux (controlled with lansoprazole and famotidine)    - it is also possible that vocal cord dysfunction may be playing a role.  This is where the     - lung function testing is normal today    - restart Arnuity 160mg take 1 puff daily.      - have access to albuterol inhaler 2 puffs every 4-6 hours as needed for cough/wheeze/shortness of breath/chest tightness.  May use 15-20 minutes prior to activity.   Monitor frequency of use.      - continue Singulair 134mat bedtime Asthma control goals:   Full participation in all desired activities (may need albuterol before activity)  Albuterol use two time or less a week on average (not counting use with activity)  Cough interfering with sleep two time or less a month  Oral steroids no more than once a year  No hospitalizations  Allergic r rhinitis    - environmental allergy testing is positive to grass pollens, weed pollens, tree pollens, cat and mold    - allergen avoidance measures discussed/handouts provided    -  Recommend using nasal saline rinse daily in the morning then follow with your nasal sprays.  Provided with rinse kit today    - resume nasal antihistamine Astelin 2 sprays twice a day for nasal drainage control.     - May use Astelin with Flonase 2 sprays each nostril daily if you also have stuffiness/congestion.      - change Xyzal to Allegra 18080maily.  It is helpful to rotate antihistamine about every 6 months or so to help reduce development of tolerance  GERD    - continue prevacid + famotidine   Follow-up 3-4 months or sooner if needed

## 2020-07-09 NOTE — Progress Notes (Signed)
New Patient Note  RE: [name] MRN: [id number] DOB: [birth year] Date of Office Visit: 07/09/2020  Referring provider: No ref. provider found Primary care provider: Jinny Sanders, MD  Chief Complaint: cough  History of present illness: [name] is a 70 y.o. female presenting today for evaluation of cough.  She is a former patient of the practice and was last seen in the office on 05/20/17 by myself.    She states she is still having issues with cough.  She states during singing she feels tickling and it triggers the cough.  Prolonged talking also triggers cough.  She throat clears all day due to postnasal drainage.  Cough is not interfering with sleep.  She has noted wheezing sometimes as well.  No chest tightness.    She states her reflux is controlled with famotidine and lansoprazole use. She just had a refill of albuterol but states has not had a chance to open box.  She states delsym helps the cough.  Also using frankencense oil applied to the upper lip seems to help as well. After her last visit it was recommended she use Arnuity however she is not sure if she can recall if it helped or not. She continues to take singulair daily and taking xyzal daily at night.     Review of systems: Review of Systems  Constitutional: Negative.   HENT:       See HPI  Eyes: Negative.   Respiratory:       See HPI  Cardiovascular: Negative.   Gastrointestinal: Negative.   Musculoskeletal: Negative.   Skin: Negative.   Neurological: Negative.     All other systems negative unless noted above in HPI  Past medical history: Past Medical History:  Diagnosis Date  . Allergic rhinitis   . Arthritis   . Asthma    mild no inaler use  . GERD (gastroesophageal reflux disease)   . Headache(784.0)    Migraines  . History of adenomatous polyp of colon   . History of concussion    AS CHILD--  NO RESIDUAL  . History of palpitations   . Hypertension   . Paget's disease of vulva ([place])    . PONV (postoperative nausea and vomiting)    SEVERE  . Wears glasses     Past surgical history: Past Surgical History:  Procedure Laterality Date  . mucoid cyst removal thumb    . 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 PULLEY RELEASE RIGHT THUMB  03-23-2011  . SIMPLE VULVECTOMY  03-24-2010   right side  . TONSILLECTOMY  1977  . TRIGGER FINGER RELEASE    . VAGINAL HYSTERECTOMY  1989   and Anterior and posterior repair's for prolapse partial  . VULVECTOMY Right 01/10/2015   Procedure: RIGHT WIDE LOCAL EXCISION VULVECTOMY;  Surgeon: [name], MD;  Location: [place];  Service: Gynecology;  Laterality: Right;  Marland Kitchen VULVECTOMY Right 04/10/2015   Procedure: WIDE LOCAL EXCISION VULVA;  Surgeon: [name], MD;  Location: [facility];  Service: Gynecology;  Laterality: Right;  Marland Kitchen VULVECTOMY PARTIAL N/A 11/07/2018   Procedure: VULVECTOMY PARTIAL;  Surgeon: [name], MD;  Location: [facility];  Service: Gynecology;  Laterality: N/A;  . WISDOM TOOTH EXTRACTION      Family history:  Family History  Problem Relation Age of Onset  . Cancer Maternal Grandmother   . Hypertension  Father   . Heart disease Father   . Asthma Father   . Hypertension Mother   . Heart disease Mother   . Breast cancer Paternal Grandmother 109  . Colon cancer Paternal Grandmother   . Colon cancer Paternal Uncle   . Allergic rhinitis Neg Hx   . Angioedema Neg Hx   . Eczema Neg Hx   . Immunodeficiency Neg Hx   . Urticaria Neg Hx   . Esophageal cancer Neg Hx   . Rectal cancer Neg Hx   . Stomach cancer Neg Hx     Social history: Lives in a home with carpeting with electric heat pump and central cooling.  Cat in the home.  There is no concern for water damage, mildew  in the home.  There is concern for roaches in the home.  She is retired.  Medication  List: Current Outpatient Medications  Medication Sig Dispense Refill  . dicyclomine (BENTYL) 10 MG capsule TAKE 1 CAPSULE(10 MG) BY MOUTH EVERY 8 HOURS 30 capsule 2  . famotidine (PEPCID) 40 MG tablet TAKE 1/2 TABLET BY MOUTH TWICE DAILY 90 tablet 3  . lansoprazole (PREVACID) 15 MG capsule Take 1 capsule (15 mg total) by mouth 2 (two) times daily before a meal.    . losartan-hydrochlorothiazide (HYZAAR) 100-25 MG tablet TAKE 1 TABLET BY MOUTH EVERY DAY 90 tablet 3  . meloxicam (MOBIC) 7.5 MG tablet Take 7.5 mg by mouth daily.    . metoprolol tartrate (LOPRESSOR) 25 MG tablet TAKE 1/2 TABLET(12.5 MG) BY MOUTH TWICE DAILY 90 tablet 0  . montelukast (SINGULAIR) 10 MG tablet TAKE 1 TABLET(10 MG) BY MOUTH EVERY MORNING 90 tablet 0  . Triamcinolone & Emollient (DERMASORB TA) 0.1 % KIT   0  . triamcinolone ointment (KENALOG) 0.5 % Apply 1 application topically 2 (two) times daily. 30 g 1  . albuterol (VENTOLIN HFA) 108 (90 Base) MCG/ACT inhaler Inhale 2 puffs into the lungs every 6 (six) hours as needed for wheezing or shortness of breath. (Patient not taking: Reported on 07/09/2020) 8 g 2  . imiquimod (ALDARA) 5 % cream Apply topically 3 (three) times a week. Apply to right vulva 3 times a week, use for 12 weeks (Patient not taking: Reported on 07/09/2020) 12 each 1   No current facility-administered medications for this visit.    Known medication allergies: Allergies  Allergen Reactions  . Ibuprofen Hypertension  . Naprosyn [Naproxen] Nausea Only  . Other     Allergic to toothpaste- makes mouth peel Hay fever/dust mites/trees "365 allergic"   . Sevoflurane Nausea And Vomiting  . Azithromycin Itching and Rash  . Sulfa Antibiotics Hives and Rash     Physical examination: Blood pressure 120/84, pulse 78, temperature 97.8 F (36.6 C), resp. rate 14, height _0  (1.626 m), weight 196 lb (88.9 kg), SpO2 96 %.  General: Alert, interactive, in no acute distress. HEENT: PERRLA, TMs pearly gray,  turbinates non-edematous without discharge, post-pharynx non erythematous. Neck: Supple without lymphadenopathy. Lungs: Clear to auscultation without wheezing, rhonchi or rales. {no increased work of breathing. CV: Normal S1, S2 without murmurs. Abdomen: Nondistended, nontender. Skin: Warm and dry, without lesions or rashes. Extremities:  No clubbing, cyanosis or edema. Neuro:   Grossly intact.  Diagnositics/Labs: Spirometry: FEV1: 2.08 L 88%, FVC: 2.71 L 87%, ratio consistent with Nonobstructive pattern  Allergy testing: environmental Allergy testing results were read and interpreted by provider, documented by clinical staff.   Assessment and plan:   Cough    -  likely multifactorial as you do have history of intermittent asthma, post-nasal drainage with throat clearing and reflux (controlled with lansoprazole and famotidine)    - it is also possible that vocal cord dysfunction may be playing a role.  This is where the     - lung function testing is normal today    - restart Arnuity 127mg take 1 puff daily.      - have access to albuterol inhaler 2 puffs every 4-6 hours as needed for cough/wheeze/shortness of breath/chest tightness.  May use 15-20 minutes prior to activity.   Monitor frequency of use.      - continue Singulair 166mat bedtime Asthma control goals:   Full participation in all desired activities (may need albuterol before activity)  Albuterol use two time or less a week on average (not counting use with activity)  Cough interfering with sleep two time or less a month  Oral steroids no more than once a year  No hospitalizations  Allergic rhinitis    - environmental allergy testing is positive to grass pollens, weed pollens, tree pollens, cat and mold    - allergen avoidance measures discussed/handouts provided    -  Recommend using nasal saline rinse daily in the morning then follow with your nasal sprays.  Provided with rinse kit today    - resume nasal  antihistamine Astelin 2 sprays twice a day for nasal drainage control.     - May use Astelin with Flonase 2 sprays each nostril daily if you also have stuffiness/congestion.      - change Xyzal to Allegra 18025maily.  It is helpful to rotate antihistamine about every 6 months or so to help reduce development of tolerance  GERD    - continue prevacid + famotidine   Follow-up 3-4 months or sooner if needed  I appreciate the opportunity to take part in Margareth's care. Please do not hesitate to contact me with questions.  Sincerely,   ShaPrudy FeelerD [facility]

## 2020-07-16 ENCOUNTER — Other Ambulatory Visit: Payer: Self-pay | Admitting: Family Medicine

## 2020-08-08 ENCOUNTER — Encounter: Payer: Self-pay | Admitting: Gynecologic Oncology

## 2020-08-08 ENCOUNTER — Inpatient Hospital Stay: Payer: Medicare Other | Attending: Gynecologic Oncology | Admitting: Gynecologic Oncology

## 2020-08-08 ENCOUNTER — Other Ambulatory Visit: Payer: Self-pay

## 2020-08-08 VITALS — BP 141/76 | HR 61 | Temp 97.1°F | Resp 20 | Ht 64.0 in | Wt 199.9 lb

## 2020-08-08 DIAGNOSIS — I10 Essential (primary) hypertension: Secondary | ICD-10-CM | POA: Insufficient documentation

## 2020-08-08 DIAGNOSIS — C519 Malignant neoplasm of vulva, unspecified: Secondary | ICD-10-CM | POA: Diagnosis not present

## 2020-08-08 DIAGNOSIS — Z791 Long term (current) use of non-steroidal anti-inflammatories (NSAID): Secondary | ICD-10-CM | POA: Insufficient documentation

## 2020-08-08 DIAGNOSIS — K219 Gastro-esophageal reflux disease without esophagitis: Secondary | ICD-10-CM | POA: Diagnosis not present

## 2020-08-08 DIAGNOSIS — Z79899 Other long term (current) drug therapy: Secondary | ICD-10-CM | POA: Insufficient documentation

## 2020-08-08 DIAGNOSIS — Z87891 Personal history of nicotine dependence: Secondary | ICD-10-CM | POA: Diagnosis not present

## 2020-08-08 NOTE — Patient Instructions (Addendum)
Everything looks normal on your exam!  I will plan to see you in 6 months unless she develops symptoms or a spot on your bottom that is concerning for recurrence of your Paget's.  If that happens, please call me at [phone] to come in sooner for a biopsy.

## 2020-08-08 NOTE — Progress Notes (Signed)
Gynecologic Oncology Return Clinic Visit  08/08/20  Reason for Visit: Follow-up visit in the setting of Paget's disease of the vulva  Treatment History: The patient previously had a supracervical hysterectomy but has her ovaries and cervix in situ. Surgery for Paget's disease of the vulva in October 2011. There was focal involvement of the medial margin. The entire lesion is approximately 5 cm in diameter. She then underwent wide local excision of a recurrent Paget's lesion in August 2016. She had surgical margins that were focally positive and subsequently underwentanother excision on 04/10/2015 with negative margins. UTMLYYT vulvar biopsy from the right inNovember 2017 that was negative. She was seen for a visible lesion and vulvar symptoms in April 2020 and a biopsy of this lesion was consistent with Paget's disease. On 11/07/2018, she underwent partial left vulvectomy with findings of a 1 cm white hyperkeratotic lesion on the left labia minora close to the clitoris. Margins were positive. Plan was for consideration of Aldara since repeat excision could involve removal of the clitoris. Patient was seen on 01/02/2019 with a slightly erythematous area on the right vulva. Plan was for repeat examination in September with biopsy if persistence of the lesion. On 9/20, patient underwent right vulvar biopsy which revealed extramammary Paget's disease. After discussion of treatment options, the patient opted for Aldara use. She was seen in early December, at which time her right vulvar lesion had completely resolved. At this visit, a small right periclitoral lesion was noted. Given her excellent response to the previous lesion, her decision was to proceed with Aldara use for this new lesion. Saw me on 07/23/19 with a right peri-clitoral lesion suspected to be side effect from Aldara cream use. Seen on 02/29/20 with no evidence of disease on exam after 3 months not using Aldara.   Interval  History: Overall doing well since her last visit.  She has occasional and sporadic vulvar itching at the right upper labia.  Denies any persistent itching.  Has not noticed any lesions on visual inspection.  Denies any pain, bleeding, or discharge.  Past Medical/Surgical History: Past Medical History:  Diagnosis Date  . Allergic rhinitis   . Arthritis   . Asthma    mild no inaler use  . GERD (gastroesophageal reflux disease)   . Headache(784.0)    Migraines  . History of adenomatous polyp of colon   . History of concussion    AS CHILD--  NO RESIDUAL  . History of palpitations   . Hypertension   . Paget's disease of vulva ([place])   . PONV (postoperative nausea and vomiting)    SEVERE  . Wears glasses     Past Surgical History:  Procedure Laterality Date  . mucoid cyst removal thumb    . 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 PULLEY RELEASE RIGHT THUMB  03-23-2011  . SIMPLE VULVECTOMY  03-24-2010   right side  . TONSILLECTOMY  1977  . TRIGGER FINGER RELEASE    . VAGINAL HYSTERECTOMY  1989   and Anterior and posterior repair's for prolapse partial  . VULVECTOMY Right 01/10/2015   Procedure: RIGHT WIDE LOCAL EXCISION VULVECTOMY;  Surgeon: [name], MD;  Location: [place];  Service: Gynecology;  Laterality: Right;  Marland Kitchen VULVECTOMY Right 04/10/2015   Procedure: WIDE LOCAL EXCISION VULVA;  Surgeon: [name], MD;  [facility];  Service: Gynecology;  Laterality: Right;  Marland Kitchen VULVECTOMY PARTIAL  N/A 11/07/2018   Procedure: VULVECTOMY PARTIAL;  Surgeon: [name], MD;  Location: [facility];  Service: Gynecology;  Laterality: N/A;  . WISDOM TOOTH EXTRACTION      Family History  Problem Relation Age of Onset  . Cancer Maternal Grandmother   . Hypertension Father   . Heart disease Father   . Asthma Father   . Hypertension  Mother   . Heart disease Mother   . Breast cancer Paternal Grandmother 8  . Colon cancer Paternal Grandmother   . Colon cancer Paternal Uncle   . Allergic rhinitis Neg Hx   . Angioedema Neg Hx   . Eczema Neg Hx   . Immunodeficiency Neg Hx   . Urticaria Neg Hx   . Esophageal cancer Neg Hx   . Rectal cancer Neg Hx   . Stomach cancer Neg Hx     Social History   Socioeconomic History  . Marital status: Married    Spouse name: Not on file  . Number of children: 3  . Years of education: Not on file  . Highest education level: Not on file  Occupational History  . Occupation: retired  Tobacco Use  . Smoking status: Former Smoker    Years: 5.00    Types: Cigarettes    Quit date: 04/15/1989    Years since quitting: 31.3  . Smokeless tobacco: Never Used  Vaping Use  . Vaping Use: Never used  Substance and Sexual Activity  . Alcohol use: Yes    Alcohol/week: 1.0 standard drink    Types: 1 Glasses of wine per week    Comment: occasional wine  . Drug use: No  . Sexual activity: Not on file  Other Topics Concern  . Not on file  Social History Narrative   She is married.  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 Difficulty of Paying Living Expenses: Not hard at all  Food Insecurity: No Food Insecurity  . Worried About Charity fundraiser in the Last Year: Never true  . Ran Out of Food in the Last Year: Never true  Transportation Needs: No Transportation Needs  . Lack of Transportation (Medical): No  . Lack of Transportation (Non-Medical): No  Physical Activity: Inactive  . Days of Exercise per Week: 0 days  . Minutes of Exercise per Session: 0 min  Stress: No Stress Concern Present  . Feeling of Stress : Not at all  Social Connections: Not on file    Current Medications:  Current Outpatient Medications:  .   albuterol (VENTOLIN HFA) 108 (90 Base) MCG/ACT inhaler, Inhale 2 puffs into the lungs every 6 (six) hours as needed for wheezing or shortness of breath., Disp: 8 g, Rfl: 2 .  azelastine (ASTELIN) 0.1 % nasal spray, Place 2 sprays into both nostrils 2 (two) times daily., Disp: 30 mL, Rfl: 5 .  dicyclomine (BENTYL) 10 MG capsule, TAKE 1 CAPSULE(10 MG) BY MOUTH EVERY 8 HOURS, Disp: 30 capsule, Rfl: 2 .  famotidine (PEPCID) 40 MG tablet, TAKE 1/2 TABLET BY MOUTH TWICE DAILY, Disp: 90 tablet, Rfl: 3 .  fexofenadine (ALLEGRA) 180 MG tablet, Take 1 tablet (180 mg total) by mouth daily., Disp: 30 tablet, Rfl: 5 .  Fluticasone Furoate (ARNUITY ELLIPTA) 100 MCG/ACT AEPB, One puff daily, Disp: 30 each, Rfl: 5 .  imiquimod (ALDARA) 5 % cream, Apply topically 3 (three) times a week. Apply to right vulva 3 times a week, use for 12 weeks, Disp: 12 each, Rfl: 1 .  lansoprazole (PREVACID) 15 MG capsule, Take 1 capsule (15 mg total) by mouth 2 (two) times daily before a meal., Disp: , Rfl:  .  losartan-hydrochlorothiazide (HYZAAR) 100-25 MG tablet, TAKE 1 TABLET BY MOUTH EVERY DAY, Disp: 90 tablet, Rfl: 3 .  meloxicam (MOBIC) 7.5 MG tablet, Take 7.5 mg by mouth daily., Disp: , Rfl:  .  metoprolol tartrate (LOPRESSOR) 25 MG tablet, TAKE 1/2 TABLET(12.5 MG) BY MOUTH TWICE DAILY, Disp: 90 tablet, Rfl: 3 .  montelukast (SINGULAIR) 10 MG tablet, TAKE 1 TABLET(10 MG) BY MOUTH EVERY MORNING, Disp: 90 tablet, Rfl: 0 .  Triamcinolone & Emollient (DERMASORB TA) 0.1 % KIT, , Disp: , Rfl: 0 .  triamcinolone ointment (KENALOG) 0.5 %, Apply 1 application topically 2 (two) times daily., Disp: 30 g, Rfl: 1  Review of Systems: + Hoarse voice Denies appetite changes, fevers, chills, fatigue, unexplained weight changes. Denies hearing loss, neck lumps or masses, mouth sores, ringing in ears or voice changes. Denies cough or wheezing.  Denies shortness of breath. Denies chest pain or palpitations. Denies leg swelling. Denies  abdominal distention, pain, blood in stools, constipation, diarrhea, nausea, vomiting, or early satiety. Denies pain with intercourse, dysuria, frequency, hematuria or incontinence. Denies hot flashes, pelvic pain, vaginal bleeding or vaginal discharge.   Denies joint pain, back pain or muscle pain/cramps. Denies itching, rash, or wounds. Denies dizziness, headaches, numbness or seizures. Denies swollen lymph nodes or glands, denies easy bruising or bleeding. Denies anxiety, depression, confusion, or decreased concentration.  Physical Exam: BP (!) 141/76 (BP Location: Right Arm, Patient Position: Sitting)   Pulse 61   Temp (!) 97.1 F (36.2 C) (Tympanic)   Resp 20   Ht 5' 4"  (1.626 m)   Wt 199 lb 14.4 oz (90.7 kg)   SpO2 97%   BMI 34.31 kg/m  General: Alert, oriented, no acute distress. HEENT: Normocephalic, atraumatic, sclera anicteric. Chest: Unlabored breathing on room air. Extremities: Grossly normal range of motion.  Warm, well perfused.  No edema bilaterally. Skin: No rashes or lesions noted. Lymphatics: No cervical, supraclavicular, or inguinal adenopathy. GU: Normal appearing external genitalia without erythema, excoriation, or lesions.  Approximately 1-2 cm scarring along the right upper labia, no associated erythema or changes consistent with Paget's disease.  Close inspection of the vulva reveals no other focal findings.  Laboratory & Radiologic Studies: None new  Assessment & Plan: [name] is a 70 y.o. woman with a history of recurrent Paget's disease of the vulva.  The patient remains with basically asymptomatic and is without evidence of recurrent disease on exam today.  She has been off treatment now for approximately 9 months.  She keeps a close eye on both her symptoms as well as visual exam of her vulva.  She knows to call me if she develops either symptoms or new lesion before her next scheduled visit.  At this time, we will continue with visits every 6  months. She still has a prescription for Aldara if she were to have recurrent symptoms.  26 minutes of total time was spent for this patient encounter, including preparation, face-to-face counseling with the patient and coordination of care, and documentation of the encounter.  Jeral Pinch, MD  Division of Gynecologic Oncology  Department of Obstetrics and Gynecology  [facility]

## 2020-08-11 NOTE — Assessment & Plan Note (Signed)
Stable, chronic.  Continue current medication.   Losartan  HCTZ 100/25  1 tablet daily, 1/2 tab of metoprolol daily

## 2020-08-11 NOTE — Assessment & Plan Note (Signed)
followed by GYN reviewed last OV 02/29/2020 Dr. Berline Lopes.

## 2020-08-11 NOTE — Assessment & Plan Note (Signed)
Increased cholesterol risk in last 2 years.. family history as well.  Statin indicated.  Pt refuses, instead would like to work on lifestyle changes. Work on healthy low cholesterol diet, start regular exercise 3-5 days a week for 20- 30 min at a time.  Make a follow up lab only visit in 3-6 months, fasting.

## 2020-08-19 DIAGNOSIS — H524 Presbyopia: Secondary | ICD-10-CM | POA: Diagnosis not present

## 2020-08-23 ENCOUNTER — Other Ambulatory Visit: Payer: Self-pay | Admitting: Family Medicine

## 2020-09-01 ENCOUNTER — Telehealth: Payer: Self-pay | Admitting: Internal Medicine

## 2020-09-01 NOTE — Telephone Encounter (Signed)
Pt is requesting for her BENTYL to be prescribed for a 30 day supply.  [facility]

## 2020-09-01 NOTE — Telephone Encounter (Signed)
Ok to send a 30 day supply?

## 2020-09-02 MED ORDER — DICYCLOMINE HCL 10 MG PO CAPS: 10.0000 mg | ORAL_CAPSULE | Freq: Three times a day (TID) | ORAL | 5 refills | Status: DC | PRN

## 2020-09-02 NOTE — Telephone Encounter (Signed)
Rx sent and I sent her a My Chart message

## 2020-09-10 ENCOUNTER — Telehealth: Payer: Self-pay | Admitting: Family Medicine

## 2020-09-10 DIAGNOSIS — D649 Anemia, unspecified: Secondary | ICD-10-CM

## 2020-09-10 DIAGNOSIS — E7800 Pure hypercholesterolemia, unspecified: Secondary | ICD-10-CM

## 2020-09-10 NOTE — Telephone Encounter (Signed)
-----   Message from Ellamae Sia sent at 09/01/2020 12:57 PM EDT ----- Regarding: lab orders for Wednesday, 4.13.22 Lab orders, thanks

## 2020-09-17 ENCOUNTER — Other Ambulatory Visit: Payer: Self-pay

## 2020-09-17 ENCOUNTER — Other Ambulatory Visit (INDEPENDENT_AMBULATORY_CARE_PROVIDER_SITE_OTHER): Payer: Medicare Other

## 2020-09-17 DIAGNOSIS — E7800 Pure hypercholesterolemia, unspecified: Secondary | ICD-10-CM | POA: Diagnosis not present

## 2020-09-17 LAB — LIPID PANEL
Cholesterol: 212 mg/dL — ABNORMAL HIGH (ref 0–200)
HDL: 71.8 mg/dL (ref >39.00)
LDL Cholesterol: 112 mg/dL — ABNORMAL HIGH (ref 0–99)
NonHDL: 140.26
Total CHOL/HDL Ratio: 3
Triglycerides: 139 mg/dL (ref 0.0–149.0)
VLDL: 27.8 mg/dL (ref 0.0–40.0)

## 2020-09-17 LAB — COMPREHENSIVE METABOLIC PANEL
ALT: 24 U/L (ref 0–35)
AST: 19 U/L (ref 0–37)
Albumin: 4 g/dL (ref 3.5–5.2)
Alkaline Phosphatase: 43 U/L (ref 39–117)
BUN: 16 mg/dL (ref 6–23)
CO2: 30 mEq/L (ref 19–32)
Calcium: 9.5 mg/dL (ref 8.4–10.5)
Chloride: 102 mEq/L (ref 96–112)
Creatinine, Ser: 0.85 mg/dL (ref 0.40–1.20)
GFR: 70.76 mL/min (ref >60.00)
Glucose, Bld: 87 mg/dL (ref 70–99)
Potassium: 3.7 mEq/L (ref 3.5–5.1)
Sodium: 140 mEq/L (ref 135–145)
Total Bilirubin: 0.7 mg/dL (ref 0.2–1.2)
Total Protein: 7 g/dL (ref 6.0–8.3)

## 2020-09-19 ENCOUNTER — Other Ambulatory Visit: Payer: Medicare Other

## 2020-09-23 ENCOUNTER — Other Ambulatory Visit: Payer: Self-pay | Admitting: Family Medicine

## 2020-09-23 MED ORDER — ATORVASTATIN CALCIUM 10 MG PO TABS: 10.0000 mg | ORAL_TABLET | Freq: Every day | ORAL | 3 refills | Status: DC

## 2020-10-08 ENCOUNTER — Encounter: Payer: Self-pay | Admitting: Allergy

## 2020-10-08 ENCOUNTER — Ambulatory Visit: Payer: Medicare Other | Admitting: Allergy

## 2020-10-08 ENCOUNTER — Other Ambulatory Visit: Payer: Self-pay

## 2020-10-08 VITALS — BP 122/80 | HR 55 | Temp 98.0°F | Resp 14 | Ht 64.0 in | Wt 190.0 lb

## 2020-10-08 DIAGNOSIS — K2100 Gastro-esophageal reflux disease with esophagitis, without bleeding: Secondary | ICD-10-CM | POA: Diagnosis not present

## 2020-10-08 DIAGNOSIS — J4530 Mild persistent asthma, uncomplicated: Secondary | ICD-10-CM

## 2020-10-08 DIAGNOSIS — J3089 Other allergic rhinitis: Secondary | ICD-10-CM | POA: Diagnosis not present

## 2020-10-08 MED ORDER — ALVESCO 80 MCG/ACT IN AERS: 2.0000 | INHALATION_SPRAY | Freq: Two times a day (BID) | RESPIRATORY_TRACT | 5 refills | Status: DC

## 2020-10-08 MED ORDER — FLONASE 50 MCG/ACT NA SUSP
2.0000 | Freq: Every day | NASAL | 5 refills | Status: DC | PRN
Start: 2020-10-08 — End: 2020-12-11

## 2020-10-08 NOTE — Patient Instructions (Addendum)
Cough    - likely multifactorial as you do have history of intermittent asthma, post-nasal drainage with throat clearing and reflux (controlled with prevacid and famotidine)    - stop Arnuity due to causing hoarseness    - start Alvesco 25mg 2 puffs twice a day with spacer device.  This is a pro-drug thus is only active in the lungs    - have access to albuterol inhaler 2 puffs every 4-6 hours as needed for cough/wheeze/shortness of breath/chest tightness.  May use 15-20 minutes prior to activity.   Monitor frequency of use.      - continue Singulair 165mat bedtime Asthma control goals:   Full participation in all desired activities (may need albuterol before activity)  Albuterol use two time or less a week on average (not counting use with activity)  Cough interfering with sleep two time or less a month  Oral steroids no more than once a year  No hospitalizations  Allergic r rhinitis    - continue avoidance measures for grass pollens, weed pollens, tree pollens, cat and mold    - recommend using nasal saline rinse daily in the morning then follow with your nasal sprays.  Provided with rinse kit today    - continue nasal antihistamine Astelin 2 sprays twice a day for nasal drainage control.     - May use Astelin with Flonase 2 sprays each nostril daily if you also have stuffiness/congestion.      - continue Allegra 18076maily.  It is helpful to rotate antihistamine about every 6 months or so to help reduce development of tolerance  GERD    - continue prevacid + famotidine   Follow-up 4 months or sooner if needed

## 2020-10-08 NOTE — Progress Notes (Signed)
Follow-up Note  RE: [name] MRN: [id number] DOB: Mar 14, 1951 Date of Office Visit: 10/08/2020   History of present illness: [name] is a 70 y.o. female presenting today for follow-up of cough, allergic rhinitis and GERD.  She was last seen in the office on 07/09/2020 by myself.  Her cough is multifactorial with postnasal drainage, GERD and asthma contributing to symptoms. She states several weeks ago in mid April she had a viral illness.  She states her husband was sick first and she developed symptoms.  She reports headache, fever, cough, wheeze, sneezing, weakness.  She had symptoms for about 1-1/2 weeks before they improved however she still has ongoing cough.  The cough is mostly dry.  She states her and her husband both tested negative for COVID on multiple accounts.  She states she did take Delsym and Mucinex and supportive care.  She states however she did not think to use her inhaler.  She has not been consistent with use of Arnuity.  She is actually stopped using as she states it was making her hoarse.  She does continue to take Singulair daily. Regards to her allergy she states the allergy regimen right now is working well for symptom control.  She is doing Human resources officer daily as well as use of nasal sprays Astelin and Flonase for nasal congestion and drainage control. She does continue to take Prevacid and famotidine for reflux control.       Review of systems in the past 4 weeks: Review of Systems  Constitutional: Positive for fever and malaise/fatigue.  HENT: Negative.   Eyes: Negative.   Respiratory: Positive for cough and wheezing.   Cardiovascular: Negative.   Gastrointestinal: Negative.   Musculoskeletal: Negative.   Skin: Negative.   Neurological: Positive for weakness and headaches.    All other systems negative unless noted above in HPI  Past medical/social/surgical/family history have been reviewed and are unchanged unless specifically indicated below.  No  changes  Medication List: Current Outpatient Medications  Medication Sig Dispense Refill  . albuterol (VENTOLIN HFA) 108 (90 Base) MCG/ACT inhaler Inhale 2 puffs into the lungs every 6 (six) hours as needed for wheezing or shortness of breath. 8 g 2  . atorvastatin (LIPITOR) 10 MG tablet Take 1 tablet (10 mg total) by mouth daily. 90 tablet 3  . azelastine (ASTELIN) 0.1 % nasal spray Place 2 sprays into both nostrils 2 (two) times daily. 30 mL 5  . dicyclomine (BENTYL) 10 MG capsule Take 1 capsule (10 mg total) by mouth every 8 (eight) hours as needed for spasms (abdominal pain). 90 capsule 5  . famotidine (PEPCID) 40 MG tablet TAKE 1/2 TABLET BY MOUTH TWICE DAILY 90 tablet 3  . fexofenadine (ALLEGRA) 180 MG tablet Take 1 tablet (180 mg total) by mouth daily. 30 tablet 5  . Fluticasone Furoate (ARNUITY ELLIPTA) 100 MCG/ACT AEPB One puff daily 30 each 5  . imiquimod (ALDARA) 5 % cream Apply topically 3 (three) times a week. Apply to right vulva 3 times a week, use for 12 weeks 12 each 1  . lansoprazole (PREVACID) 15 MG capsule Take 1 capsule (15 mg total) by mouth 2 (two) times daily before a meal.    . losartan-hydrochlorothiazide (HYZAAR) 100-25 MG tablet TAKE 1 TABLET BY MOUTH EVERY DAY 90 tablet 3  . meloxicam (MOBIC) 7.5 MG tablet Take 7.5 mg by mouth daily.    . metoprolol tartrate (LOPRESSOR) 25 MG tablet TAKE 1/2 TABLET(12.5 MG) BY MOUTH TWICE  DAILY 90 tablet 3  . montelukast (SINGULAIR) 10 MG tablet TAKE 1 TABLET(10 MG) BY MOUTH EVERY MORNING 90 tablet 3  . Triamcinolone & Emollient (DERMASORB TA) 0.1 % KIT   0  . triamcinolone ointment (KENALOG) 0.5 % Apply 1 application topically 2 (two) times daily. 30 g 1   No current facility-administered medications for this visit.     Known medication allergies: Allergies  Allergen Reactions  . Ibuprofen Hypertension  . Naprosyn [Naproxen] Nausea Only  . Other     Allergic to toothpaste- makes mouth peel Hay fever/dust mites/trees "365  allergic"   . Sevoflurane Nausea And Vomiting  . Azithromycin Itching and Rash  . Sulfa Antibiotics Hives and Rash     Physical examination: Blood pressure 122/80, pulse (!) 55, temperature 98 F (36.7 C), resp. rate 14, height _0  (1.626 m), weight 190 lb (86.2 kg), SpO2 97 %.  General: Alert, interactive, in no acute distress. HEENT: PERRLA, TMs pearly gray, turbinates minimally edematous without discharge, post-pharynx non erythematous. Neck: Supple without lymphadenopathy. Lungs: Clear to auscultation without wheezing, rhonchi or rales. {no increased work of breathing. CV: Normal S1, S2 without murmurs. Abdomen: Nondistended, nontender. Skin: Warm and dry, without lesions or rashes. Extremities:  No clubbing, cyanosis or edema. Neuro:   Grossly intact.  Diagnositics/Labs: None today  Assessment and plan:   Cough    - likely multifactorial as you do have history of intermittent asthma, post-nasal drainage with throat clearing and reflux (controlled with prevacid and famotidine)    - stop Arnuity due to causing hoarseness    - start Alvesco 49mg 2 puffs twice a day with spacer device.  This is a pro-drug thus is only active in the lungs    - have access to albuterol inhaler 2 puffs every 4-6 hours as needed for cough/wheeze/shortness of breath/chest tightness.  May use 15-20 minutes prior to activity.   Monitor frequency of use.      - continue Singulair 149mat bedtime Asthma control goals:   Full participation in all desired activities (may need albuterol before activity)  Albuterol use two time or less a week on average (not counting use with activity)  Cough interfering with sleep two time or less a month  Oral steroids no more than once a year  No hospitalizations  Allergic rhinitis    - continue avoidance measures for grass pollens, weed pollens, tree pollens, cat and mold    - recommend using nasal saline rinse daily in the morning then follow with your nasal  sprays.  Provided with rinse kit today    - continue nasal antihistamine Astelin 2 sprays twice a day for nasal drainage control.     - May use Astelin with Flonase 2 sprays each nostril daily if you also have stuffiness/congestion.      - continue Allegra 18062maily.  It is helpful to rotate antihistamine about every 6 months or so to help reduce development of tolerance  GERD    - continue prevacid + famotidine   Follow-up 4 months or sooner if needed  I appreciate the opportunity to take part in [name]'s care. Please do not hesitate to contact me with questions.  Sincerely,   ShaPrudy FeelerD [facility]

## 2020-10-17 DIAGNOSIS — L814 Other melanin hyperpigmentation: Secondary | ICD-10-CM | POA: Diagnosis not present

## 2020-10-17 DIAGNOSIS — D1801 Hemangioma of skin and subcutaneous tissue: Secondary | ICD-10-CM | POA: Diagnosis not present

## 2020-10-17 DIAGNOSIS — L304 Erythema intertrigo: Secondary | ICD-10-CM | POA: Diagnosis not present

## 2020-10-17 DIAGNOSIS — L821 Other seborrheic keratosis: Secondary | ICD-10-CM | POA: Diagnosis not present

## 2020-12-11 ENCOUNTER — Encounter: Payer: Self-pay | Admitting: Family Medicine

## 2020-12-11 ENCOUNTER — Telehealth: Payer: Medicare Other | Admitting: Family Medicine

## 2020-12-11 ENCOUNTER — Telehealth: Payer: Self-pay | Admitting: *Deleted

## 2020-12-11 ENCOUNTER — Telehealth (INDEPENDENT_AMBULATORY_CARE_PROVIDER_SITE_OTHER): Payer: Medicare Other | Admitting: Family Medicine

## 2020-12-11 VITALS — Temp 100.4°F

## 2020-12-11 DIAGNOSIS — U071 COVID-19: Secondary | ICD-10-CM | POA: Diagnosis not present

## 2020-12-11 MED ORDER — BENZONATATE 100 MG PO CAPS: 100.0000 mg | ORAL_CAPSULE | Freq: Three times a day (TID) | ORAL | 0 refills | Status: DC | PRN

## 2020-12-11 MED ORDER — NIRMATRELVIR/RITONAVIR (PAXLOVID)TABLET: 3.0000 | ORAL_TABLET | Freq: Two times a day (BID) | ORAL | 0 refills | Status: AC

## 2020-12-11 NOTE — Progress Notes (Signed)
Virtual Visit via Telephone Note  I connected with [name] on 12/11/20 at  1:20 PM EDT by telephone and verified that I am speaking with the correct person using two identifiers.   I discussed the limitations, risks, security and privacy concerns of performing an evaluation and management service by telephone and the availability of in person appointments. I also discussed with the patient that there may be a patient responsible charge related to this service. The patient expressed understanding and agreed to proceed.  Location patient: home,  Location provider: work or home office Participants present for the call: patient, provider Patient did not have a visit with me in the prior 7 days to address this/these issue(s).   History of Present Illness:  Acute telemedicine visit for : -Onset: last night -Symptoms include:cough, nasal congestion, HA, low grade fever -Denies: NVD, CP, SOB, inability to eat/drink/get out of bed -Pertinent past medical history: see below -Pertinent medication allergies:  Allergies  Allergen Reactions   Ibuprofen Hypertension   Naprosyn [Naproxen] Nausea Only   Other     Allergic to toothpaste- makes mouth peel Hay fever/dust mites/trees "365 allergic"    Sevoflurane Nausea And Vomiting   Azithromycin Itching and Rash   Sulfa Antibiotics Hives and Rash  -COVID-19 vaccine status:not vaccinated -had labs in the last few months with GFR 70  Past Medical History:  Diagnosis Date   Allergic rhinitis    Arthritis    Asthma    mild no inaler use   GERD (gastroesophageal reflux disease)    Headache(784.0)    Migraines   History of adenomatous polyp of colon    History of concussion    AS CHILD--  NO RESIDUAL   History of palpitations    Hypertension    Paget's disease of vulva (HCC)    PONV (postoperative nausea and vomiting)    SEVERE   Wears glasses      Observations/Objective: Patient sounds cheerful and well on the phone. I do not  appreciate any SOB. Speech and thought processing are grossly intact. Patient reported vitals: T   Assessment and Plan:  [id number]   Discussed treatment options, ideal treatment window, potential complications, isolation and precautions for COVID-19.  After lengthy discussion, the patient opted for treatment with Paxlovid due to being higher risk for complications of covid or severe disease and other factors. Ran her medications through the [facility] drug interaction checker and did advise to hold lipitor during treatment and avoid bentyl as is prn, and metabolism unknown. Discussed EUA status of this drug and the fact that there is preliminary limited knowledge of risks/interactions/side effects per EUA document vs possible benefits and precautions. This information was shared with patient during the visit and also was provided in patient instructions. Also, advised that patient discuss risks/interactions and use with pharmacist/treatment team as well. The patient did want a prescription for cough, Tessalon Rx sent.  Other symptomatic care measures summarized in patient instructions. Work/School slipped offered: declined Scheduled follow up with PCP offered: she opted for prn follow up w/ PCP or [facility]. Advised to seek prompt in person care if worsening, new symptoms arise, or if is not improving with treatment. Advised of options for inperson care in case PCP office not available. Did let the patient know that I only do telemedicine shifts for [facility] on Tuesdays and Thursdays and advised a follow up visit with PCP or at an [facility] if has further questions or concerns.   Follow Up Instructions:  I did not refer this patient for an OV with me in the next 24 hours for this/these issue(s).  I discussed the assessment and treatment plan with the patient. The patient was provided an opportunity to ask questions and all were answered. The patient agreed with the plan and demonstrated an understanding of the  instructions.   I spent 15 minutes on the date of this visit in the care of this patient. See summary of tasks completed to properly care for this patient in the detailed notes above which also included counseling of above, review of PMH, medications, allergies, evaluation of the patient and ordering and/or  instructing patient on testing and care options.     Maria Kern, DO

## 2020-12-11 NOTE — Telephone Encounter (Signed)
[name] left voicemail on triage line.  States she has tested positive for Covid and is asking what she should do.  I spoke with [name].  She has taken 2 home test and they were both positive.  She complain on Headache, Chills and Cough. Her symptoms started yesterday.  She has a history of asthma and allergies.  Virtual Appointment scheduled with Dr. Maudie Mercury at 1:20 pm today to discuss treatment options.

## 2020-12-11 NOTE — Patient Instructions (Signed)
HOME CARE TIPS:  -[place] testing information: [email or web address].rivera-[email or web address] OR [phone] Most pharmacies also offer testing and home test kits. If the Covid19 test is positive, please make a prompt follow up visit with your primary care office or with [facility] to discuss treatment options. Treatments for Covid19 are best given early in the course of the illness.   -I sent the medication(s) we discussed to your pharmacy: Meds ordered this encounter  Medications   nirmatrelvir/ritonavir EUA (PAXLOVID) TABS    Sig: Take 3 tablets by mouth 2 (two) times daily for 5 days. (Take nirmatrelvir 150 mg two tablets twice daily for 5 days and ritonavir 100 mg one tablet twice daily for 5 days) Patient GFR is 70 in April of 2022    Dispense:  30 tablet    Refill:  0   benzonatate (TESSALON PERLES) 100 MG capsule    Sig: Take 1 capsule (100 mg total) by mouth 3 (three) times daily as needed.    Dispense:  20 capsule    Refill:  0     -I sent in the [place] treatment or referral you requested per our discussion. Please see the information provided below and discuss further with the pharmacist/treatment team. Please stop taking your Lipitor while your are taking the Paxlovid. Also hold your bentyl.   -can use tylenol  if needed for fevers, aches and pains per instructions  -can use nasal saline a few times per day if you have nasal congestion  -stay hydrated, drink plenty of fluids and eat small healthy meals - avoid dairy  -check out the [facility] website for more information on home care, transmission and treatment for COVID19  -follow up with your doctor in 2-3 days unless improving and feeling better  -stay home while sick, except to seek medical care. If you have COVID19, ideally it would be best to stay home for a full 10 days since the onset of symptoms PLUS one day of no fever and feeling better. Wear a good mask that fits snugly (such as N95 or KN95)  if around others to reduce the risk of transmission.  It was nice to meet you today, and I really hope you are feeling better soon. I help [facility] out with telemedicine visits on Tuesdays and Thursdays and am available for visits on those days. If you have any concerns or questions following this visit please schedule a follow up visit with your Primary Care doctor or seek care at a local urgent care clinic to avoid delays in care.    Seek in person care or schedule a follow up video visit promptly if your symptoms worsen, new concerns arise or you are not improving with treatment. Call 911 and/or seek emergency care if your symptoms are severe or life threatening.  FACT SHEET FOR PATIENTS, PARENTS, AND CAREGIVERS EMERGENCY USE AUTHORIZATION (EUA) OF PAXLOVID FOR CORONAVIRUS DISEASE 2019 (COVID-19) You are being given this Fact Sheet because your healthcare provider believes it is necessary to provide you with PAXLOVID for the treatment of mild-to-moderate coronavirus disease (COVID-19) caused by the SARS-CoV-2 virus. This Fact Sheet contains information to help you understand the risks and benefits of taking the PAXLOVID you have received or may receive. The U.S. Food and Drug Administration (FDA) has issued an Emergency Use Authorization (EUA) to make PAXLOVID available during the COVID-19 pandemic (for more details about an EUA please see "What is an Emergency Use Authorization?" at the end of this document). PAXLOVID is not an FDA-approved medicine  in the [place]. Read this Fact Sheet for information about PAXLOVID. Talk to your healthcare provider about your options or if you have any questions. It is your choice to take PAXLOVID.  What is COVID-19? COVID-19 is caused by a virus called a coronavirus. You can get COVID-19 through close contact with another person who has the virus. COVID-19 illnesses have ranged from very mild-to-severe, including illness resulting in death.  While information so far suggests that most COVID-19 illness is mild, serious illness can happen and may cause some of your other medical conditions to become worse. Older people and people of all ages with severe, long lasting (chronic) medical conditions like heart disease, lung disease, and diabetes, for example seem to be at higher risk of being hospitalized for COVID-19.  What is PAXLOVID? PAXLOVID is an investigational medicine used to treat mild-to-moderate COVID-19 in adults and children [60 years of age and older weighing at least 74 pounds (48 kg)] with positive results of direct SARS-CoV-2 viral testing, and who are at high risk for progression to severe COVID-19, including hospitalization or death. PAXLOVID is investigational because it is still being studied. There is limited information about the safety and effectiveness of using PAXLOVID to treat people with mild-to-moderate COVID-19.  The FDA has authorized the emergency use of PAXLOVID for the treatment of mild-tomoderate COVID-19 in adults and children [20 years of age and older weighing at least 52 pounds (57 kg)] with a positive test for the virus that causes COVID-19, and who are at high risk for progression to severe COVID-19, including hospitalization or death, under an EUA. 1 Revised: 22 August 2020   What should I tell my healthcare provider before I take PAXLOVID? Tell your healthcare provider if you: ? Have any allergies ? Have liver or kidney disease ? Are pregnant or plan to become pregnant ? Are breastfeeding a child ? Have any serious illnesses  Tell your healthcare provider about all the medicines you take, including prescription and over-the-counter medicines, vitamins, and herbal supplements. Some medicines may interact with PAXLOVID and may cause serious side effects. Keep a list of your medicines to show your healthcare provider and pharmacist when you get a new medicine.  You can ask your  healthcare provider or pharmacist for a list of medicines that interact with PAXLOVID. Do not start taking a new medicine without telling your healthcare provider. Your healthcare provider can tell you if it is safe to take PAXLOVID with other medicines.  Tell your healthcare provider if you are taking combined hormonal contraceptive. PAXLOVID may affect how your birth control pills work. Females who are able to become pregnant should use another effective alternative form of contraception or an additional barrier method of contraception. Talk to your healthcare provider if you have any questions about contraceptive methods that might be right for you.  How do I take PAXLOVID? ? PAXLOVID consists of 2 medicines: nirmatrelvir and ritonavir. o Take 2 pink tablets of nirmatrelvir with 1 white tablet of ritonavir by mouth 2 times each day (in the morning and in the evening) for 5 days. For each dose, take all 3 tablets at the same time. o If you have kidney disease, talk to your healthcare provider. You may need a different dose. ? Swallow the tablets whole. Do not chew, break, or crush the tablets. ? Take PAXLOVID with or without food. ? Do not stop taking PAXLOVID without talking to your healthcare provider, even if you feel better. ? If you  miss a dose of PAXLOVID within 8 hours of the time it is usually taken, take it as soon as you remember. If you miss a dose by more than 8 hours, skip the missed dose and take the next dose at your regular time. Do not take 2 doses of PAXLOVID at the same time. ? If you take too much PAXLOVID, call your healthcare provider or go to the nearest hospital emergency room right away. ? If you are taking a ritonavir- or cobicistat-containing medicine to treat hepatitis C or Human Immunodeficiency Virus (HIV), you should continue to take your medicine as prescribed by your healthcare provider. 2 Revised: 22 August 2020    Talk to your healthcare provider  if you do not feel better or if you feel worse after 5 days.  Who should generally not take PAXLOVID? Do not take PAXLOVID if: ? You are allergic to nirmatrelvir, ritonavir, or any of the ingredients in PAXLOVID. ? You are taking any of the following medicines: o Alfuzosin o Pethidine, propoxyphene o Ranolazine o Amiodarone, dronedarone, flecainide, propafenone, quinidine o Colchicine o Lurasidone, pimozide, clozapine o Dihydroergotamine, ergotamine, methylergonovine o Lovastatin, simvastatin o Sildenafil (Revatio) for pulmonary arterial hypertension (PAH) o Triazolam, oral midazolam o Apalutamide o Carbamazepine, phenobarbital, phenytoin o Rifampin o St. John's Wort (hypericum perforatum) Taking PAXLOVID with these medicines may cause serious or life-threatening side effects or affect how PAXLOVID works.  These are not the only medicines that may cause serious side effects if taken with PAXLOVID. PAXLOVID may increase or decrease the levels of multiple other medicines. It is very important to tell your healthcare provider about all of the medicines you are taking because additional laboratory tests or changes in the dose of your other medicines may be necessary while you are taking PAXLOVID. Your healthcare provider may also tell you about specific symptoms to watch out for that may indicate that you need to stop or decrease the dose of some of your other medicines.  What are the important possible side effects of PAXLOVID? Possible side effects of PAXLOVID are: ? Allergic Reactions. Allergic reactions can happen in people taking PAXLOVID, even after only 1 dose. Stop taking PAXLOVID and call your healthcare provider right away if you get any of the following symptoms of an allergic reaction: o hives o trouble swallowing or breathing o swelling of the mouth, lips, or face o throat tightness o hoarseness 3 Revised: 22 August 2020  o skin rash ? Liver Problems. Tell your  healthcare provider right away if you have any of these signs and symptoms of liver problems: loss of appetite, yellowing of your skin and the whites of eyes (jaundice), dark-colored urine, pale colored stools and itchy skin, stomach area (abdominal) pain. ? Resistance to HIV Medicines. If you have untreated HIV infection, PAXLOVID may lead to some HIV medicines not working as well in the future. ? Other possible side effects include: o altered sense of taste o diarrhea o high blood pressure o muscle aches These are not all the possible side effects of PAXLOVID. Not many people have taken PAXLOVID. Serious and unexpected side effects may happen. PAXLOVID is still being studied, so it is possible that all of the risks are not known at this time.  What other treatment choices are there? Veklury (remdesivir) is FDA-approved for the treatment of mild-to-moderate [id number] in certain adults and children. Talk with your doctor to see if Marijean Heath is appropriate for you. Like PAXLOVID, FDA may also allow for the emergency  use of other medicines to treat people with COVID-19. Go to https://price.info/ for information on the emergency use of other medicines that are authorized by FDA to treat people with COVID-19. Your healthcare provider may talk with you about clinical trials for which you may be eligible. It is your choice to be treated or not to be treated with PAXLOVID. Should you decide not to receive it or for your child not to receive it, it will not change your standard medical care.  What if I am pregnant or breastfeeding? There is no experience treating pregnant women or breastfeeding mothers with PAXLOVID. For a mother and unborn baby, the benefit of taking PAXLOVID may be greater than the risk from the treatment. If you are pregnant, discuss your options and specific situation  with your healthcare provider. It is recommended that you use effective barrier contraception or do not have sexual activity while taking PAXLOVID. If you are breastfeeding, discuss your options and specific situation with your healthcare provider. 4 Revised: 22 August 2020   How do I report side effects with PAXLOVID? Contact your healthcare provider if you have any side effects that bother you or do not go away. Report side effects to FDA MedWatch at SmoothHits.hu or call 1-800-FDA1088 or you can report side effects to [employer]. at the contact information provided below. Website Fax number Telephone number www.pfizersafetyreporting.com [phone] How should I store [facility]? Store PAXLOVID tablets at room temperature, between 68?F to 77?F (20?C to 25?C). How can I learn more about COVID-19? ? Ask your healthcare provider. ? Visit https://jacobson-johnson.com/. ? Contact your local or state public health department. What is an Emergency Use Authorization (EUA)? The [place] FDA has made PAXLOVID available under an emergency access mechanism called an Emergency Use Authorization (EUA). The EUA is supported by a Education officer, museum and Human Service (HHS) declaration that circumstances exist to justify the emergency use of drugs and biological products during the COVID-19 pandemic. PAXLOVID for the treatment of mild-to-moderate COVID-19 in adults and children [78 years of age and older weighing at least 77 pounds (47 kg)] with positive results of direct SARS-CoV-2 viral testing, and who are at high risk for progression to severe COVID-19, including hospitalization or death, has not undergone the same type of review as an FDA-approved product. In issuing an EUA under the BMWUX-32 public health emergency, the FDA has determined, among other things, that based on the total amount of scientific evidence available including data from adequate  and well-controlled clinical trials, if available, it is reasonable to believe that the product may be effective for diagnosing, treating, or preventing COVID-19, or a serious or life-threatening disease or condition caused by COVID-19; that the known and potential benefits of the product, when used to diagnose, treat, or prevent such disease or condition, outweigh the known and potential risks of such product; and that there are no adequate, approved, and available alternatives. All of these criteria must be met to allow for the product to be used in the treatment of patients during the COVID-19 pandemic. The EUA for PAXLOVID is in effect for the duration of the COVID-19 declaration justifying emergency use of this product, unless terminated or revoked (after which the products may no longer be used under the EUA). 5 Revised: 22 August 2020     Additional Information For general questions, visit the website or call the telephone number provided below. Website Telephone number www.COVID19oralRx.com [phone] (1-877-C19-PACK) You can also go to www.pfizermedinfo.com or call 9020109975  for more information. NDL-[id number] Revised: 22 August 2020

## 2020-12-29 ENCOUNTER — Telehealth: Payer: Self-pay

## 2020-12-29 NOTE — Telephone Encounter (Signed)
Noted. Thank you Will see her in office tomorrow

## 2020-12-29 NOTE — Telephone Encounter (Signed)
[facility] Night - Client TELEPHONE ADVICE RECORD [facility] Patient Name: [name] Gender: Female DOB: 01-08-1951 Age: 70 Y 11 M 17 D Return Phone Number: [phone] (Primary), [phone] (Secondary) Address: City/ State/ Zip: [place] [address] Client [facility] Night - Client Client Site [place] Physician Eliezer Lofts - MD Contact Type Call Who Is Calling Patient / Member / Family / Caregiver Call Type Triage / Clinical Relationship To Patient Self Return Phone Number [phone] (Primary) Chief Complaint Cough Reason for Call Symptomatic / Request for [place] states she is having a cough. States she had COVID a few weeks ago. Wants to know what they should be doing. Translation No Nurse Assessment Nurse: Nyoka Cowden, RN, Tanika Date/Time (Eastern Time): 12/28/2020 9:19:08 PM Confirm and document reason for call. If symptomatic, describe symptoms. ---Caller states she is having a cough. States she had COVID a few weeks ago. cough ongoing since july 7 Does the patient have any new or worsening symptoms? ---Yes Will a triage be completed? ---Yes Related visit to physician within the last 2 weeks? ---No Does the PT have any chronic conditions? (i.e. diabetes, asthma, this includes High risk factors for pregnancy, etc.) ---Yes List chronic conditions. ---htn, asthma, allergies Is this a behavioral health or substance abuse call? ---No Guidelines Guideline Title Affirmed Question Affirmed Notes Nurse Date/Time (Eastern Time) Cough - Acute Productive Cough has been present for > 3 weeks Nyoka Cowden, RN, Tanika 12/28/2020 9:20:05 PM Disp. Time Eilene Ghazi Time) Disposition Final User 12/28/2020 9:25:19 PM SEE PCP WITHIN 3 DAYS Yes Nyoka Cowden, RN, Ludger Nutting Caller Disagree/Comply Comply PLEASE NOTE: All timestamps contained within this report are represented as  [place] Standard Time. CONFIDENTIALTY NOTICE: This fax transmission is intended only for the addressee. It contains information that is legally privileged, confidential or otherwise protected from use or disclosure. If you are not the intended recipient, you are strictly prohibited from reviewing, disclosing, copying using or disseminating any of this information or taking any action in reliance on or regarding this information. If you have received this fax in error, please notify us immediately by telephone so that we can arrange for its return to [place]. Phone: [phone], Toll-Free: [phone], Fax: [phone] Page: 2 of 2 Call Id: [id number] [place] Understands Yes PreDisposition Did not know what to do Care Advice Given Per Guideline SEE PCP WITHIN 3 DAYS: * You need to be seen within 2 or 3 days. CARE ADVICE given per Cough - Acute Productive (Adult) guideline. CALL BACK IF: * You become worse * If the air is dry, use a humidifier in the bedroom. HUMIDIFIER: * Do not try to completely stop coughs that produce mucus and phlegm. COUGH SYRUP WITH DEXTROMETHORPHAN - EXTRA NOTES AND WARNINGS:

## 2020-12-29 NOTE — Telephone Encounter (Signed)
Pt c/o residual cough w/clear phlegm from covid on 7/7 of this year. Using ricola lozenges for relief but cough is interrupting sleep. Pt denies all other symptoms. Advised pt she needs assessed in office. Pt agreed to apt tomorrow. Scheduled for tomorrow. Advised if any symptoms worsened or she developed any new symptoms to contact office. Advised of ER precautions. Pt verbalized understanding and was voiced appreciation for call.

## 2020-12-30 ENCOUNTER — Ambulatory Visit (INDEPENDENT_AMBULATORY_CARE_PROVIDER_SITE_OTHER): Payer: Medicare Other | Admitting: Nurse Practitioner

## 2020-12-30 ENCOUNTER — Other Ambulatory Visit: Payer: Self-pay

## 2020-12-30 ENCOUNTER — Encounter: Payer: Self-pay | Admitting: Nurse Practitioner

## 2020-12-30 VITALS — BP 140/66 | HR 71 | Temp 97.0°F | Resp 16 | Ht 64.0 in | Wt 192.0 lb

## 2020-12-30 DIAGNOSIS — R058 Other specified cough: Secondary | ICD-10-CM | POA: Insufficient documentation

## 2020-12-30 NOTE — Progress Notes (Signed)
Acute Office Visit  Subjective:    Patient ID: [name], female    DOB: April 08, 1951, 70 y.o.   MRN: [id number]  Chief Complaint  Patient presents with   Cough    Some productivity, since 12/11/20 when she had Covid. She has rechecked COVID test x 2 since then and it was negative.     Patient is in today for persistent cough post COVID-19 infection.  Patient had a telehealth visit on 12-12-2019 where she was diagnosed with COVID-19 infection and prescribed appropriate medication.  Patient presents today as she still has cough.  She was prescribed a cough suppressant Tessalon Perles.  States she took 2 days worth and has since lost the medication bottle.  She has been using Delsym and Ricola over-the-counter medications with relief.  When asked about the course of the cough she states it does feel better than previous just been a slow improvement.  Does not have other concerning symptoms.  No shortness of breath or chest pain.  States the cough is somewhat productive intermittently with clear sputum.  Does have history of asthma.  Followed by allergist.   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  02-17-2011   PULLEY RELEASE RIGHT THUMB  03-23-2011   SIMPLE VULVECTOMY  03-24-2010   right side   TONSILLECTOMY  1977   TRIGGER FINGER RELEASE     VAGINAL HYSTERECTOMY  1989   and Anterior and posterior repair's for  prolapse partial   VULVECTOMY Right 01/10/2015   Procedure: RIGHT WIDE LOCAL EXCISION VULVECTOMY;  Surgeon: [name], MD;  Location: [place] [facility];  Service: Gynecology;  Laterality: Right;   VULVECTOMY Right 04/10/2015   Procedure: WIDE LOCAL EXCISION VULVA;  Surgeon: [name], MD;  Location: [facility];  Service: Gynecology;  Laterality: Right;   VULVECTOMY PARTIAL N/A 11/07/2018   Procedure: VULVECTOMY PARTIAL;  Surgeon: [name], MD;  Location: [facility];  Service: Gynecology;  Laterality: N/A;   WISDOM TOOTH EXTRACTION      Family History  Problem Relation Age of Onset   Cancer Maternal Grandmother    Hypertension Father    Heart disease Father    Asthma Father    Hypertension Mother    Heart disease Mother    Breast cancer Paternal Grandmother 68   Colon cancer Paternal Grandmother    Colon cancer Paternal Uncle    Allergic rhinitis Neg Hx    Angioedema Neg Hx    Eczema Neg Hx    Immunodeficiency Neg Hx    Urticaria Neg Hx    Esophageal cancer Neg Hx    Rectal cancer Neg Hx    Stomach cancer Neg Hx     Social History   Socioeconomic History  Marital status: Married    Spouse name: Not on file   Number of children: 3   Years of education: Not on file   Highest education level: Not on file  Occupational History   Occupation: retired  Tobacco Use   Smoking status: Former    Years: 5.00    Types: Cigarettes    Quit date: 04/15/1989    Years since quitting: 31.7   Smokeless tobacco: Never  Vaping Use   Vaping Use: Never used  Substance and Sexual Activity   Alcohol use: Yes    Alcohol/week: 1.0 standard drink    Types: 1 Glasses of wine per week    Comment: occasional wine   Drug use: No   Sexual activity: Not on file  Other Topics Concern   Not on file  Social History Narrative   She is married.  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   Occasional wine, former smoker not now no  substances   3 kids plus two step daughters   Social Determinants of Radio broadcast assistant Strain: Low Risk    Difficulty of Paying Living Expenses: Not hard at all  Food Insecurity: No Food Insecurity   Worried About Charity fundraiser in the Last Year: Never true   Arboriculturist in the Last Year: Never true  Transportation Needs: No Transportation Needs   Lack of Transportation (Medical): No   Lack of Transportation (Non-Medical): No  Physical Activity: Inactive   Days of Exercise per Week: 0 days   Minutes of Exercise per Session: 0 min  Stress: No Stress Concern Present   Feeling of Stress : Not at all  Social Connections: Not on file  Intimate Partner Violence: Not At Risk   Fear of Current or Ex-Partner: No   Emotionally Abused: No   Physically Abused: No   Sexually Abused: No    Outpatient Medications Prior to Visit  Medication Sig Dispense Refill   albuterol (VENTOLIN HFA) 108 (90 Base) MCG/ACT inhaler Inhale 2 puffs into the lungs every 6 (six) hours as needed for wheezing or shortness of breath. 8 g 2   ALVESCO 80 MCG/ACT inhaler Inhale 2 puffs into the lungs 2 (two) times daily. 6.1 g 5   atorvastatin (LIPITOR) 10 MG tablet Take 1 tablet (10 mg total) by mouth daily. 90 tablet 3   azelastine (ASTELIN) 0.1 % nasal spray Place 2 sprays into both nostrils 2 (two) times daily. 30 mL 5   dicyclomine (BENTYL) 10 MG capsule Take 1 capsule (10 mg total) by mouth every 8 (eight) hours as needed for spasms (abdominal pain). 90 capsule 5   famotidine (PEPCID) 40 MG tablet TAKE 1/2 TABLET BY MOUTH TWICE DAILY 90 tablet 3   fexofenadine (ALLEGRA) 180 MG tablet Take 1 tablet (180 mg total) by mouth daily. 30 tablet 5   imiquimod (ALDARA) 5 % cream Apply topically 3 (three) times a week. Apply to right vulva 3 times a week, use for 12 weeks 12 each 1   lansoprazole (PREVACID) 15 MG capsule Take 1 capsule (15 mg total) by mouth 2 (two) times daily before a meal.      losartan-hydrochlorothiazide (HYZAAR) 100-25 MG tablet TAKE 1 TABLET BY MOUTH EVERY DAY 90 tablet 3   meloxicam (MOBIC) 7.5 MG tablet Take 7.5 mg by mouth daily.     metoprolol tartrate (LOPRESSOR) 25 MG tablet TAKE 1/2 TABLET(12.5 MG) BY MOUTH TWICE DAILY 90 tablet 3   montelukast (SINGULAIR)  10 MG tablet TAKE 1 TABLET(10 MG) BY MOUTH EVERY MORNING 90 tablet 3   Triamcinolone & Emollient (DERMASORB TA) 0.1 % KIT   0   benzonatate (TESSALON PERLES) 100 MG capsule Take 1 capsule (100 mg total) by mouth 3 (three) times daily as needed. (Patient not taking: Reported on 12/30/2020) 20 capsule 0   No facility-administered medications prior to visit.    Allergies  Allergen Reactions   Ibuprofen Hypertension   Naprosyn [Naproxen] Nausea Only   Other     Allergic to toothpaste- makes mouth peel Hay fever/dust mites/trees "365 allergic"    Sevoflurane Nausea And Vomiting   Azithromycin Itching and Rash   Sulfa Antibiotics Hives and Rash    Review of Systems  Constitutional:  Negative for chills and fatigue.  Respiratory:  Positive for cough. Negative for shortness of breath.        With intermittent clear sputum production.  Cardiovascular:  Negative for chest pain.  Gastrointestinal:  Negative for diarrhea, nausea and vomiting.      Objective:    Physical Exam Constitutional:      Appearance: Normal appearance.  Neck:     Thyroid: No thyroid mass, thyromegaly or thyroid tenderness.  Cardiovascular:     Rate and Rhythm: Normal rate and regular rhythm.     Heart sounds: Normal heart sounds.  Pulmonary:     Effort: Pulmonary effort is normal.     Breath sounds: Normal breath sounds.  Abdominal:     General: Bowel sounds are normal.  Lymphadenopathy:     Cervical: No cervical adenopathy.  Neurological:     Mental Status: She is alert.    BP 140/66   Pulse 71   Temp (!) 97 F (36.1 C)   Resp 16   Ht _0  (1.626 m)   Wt 192 lb (87.1 kg)   SpO2 95%   BMI 32.96 kg/m  Wt  Readings from Last 3 Encounters:  12/30/20 192 lb (87.1 kg)  10/08/20 190 lb (86.2 kg)  08/08/20 199 lb 14.4 oz (90.7 kg)    Health Maintenance Due  Topic Date Due   Zoster Vaccines- Shingrix (1 of 2) Never done    There are no preventive care reminders to display for this patient.   Lab Results  Component Value Date   TSH 1.39 12/13/2017   Lab Results  Component Value Date   WBC 7.6 02/13/2020   HGB 14.4 02/13/2020   HCT 42.2 02/13/2020   MCV 95.2 02/13/2020   PLT 245.0 02/13/2020   Lab Results  Component Value Date   NA 140 09/17/2020   K 3.7 09/17/2020   CO2 30 09/17/2020   GLUCOSE 87 09/17/2020   BUN 16 09/17/2020   CREATININE 0.85 09/17/2020   BILITOT 0.7 09/17/2020   ALKPHOS 43 09/17/2020   AST 19 09/17/2020   ALT 24 09/17/2020   PROT 7.0 09/17/2020   ALBUMIN 4.0 09/17/2020   CALCIUM 9.5 09/17/2020   ANIONGAP 10 08/10/2011   GFR 70.76 09/17/2020   Lab Results  Component Value Date   CHOL 212 (H) 09/17/2020   Lab Results  Component Value Date   HDL 71.80 09/17/2020   Lab Results  Component Value Date   LDLCALC 112 (H) 09/17/2020   Lab Results  Component Value Date   TRIG 139.0 09/17/2020   Lab Results  Component Value Date   CHOLHDL 3 09/17/2020   No results found for: HGBA1C     Assessment & Plan:  Problem List Items Addressed This Visit       Other   Cough productive of clear sputum - Primary    Discussed with patient different treatment options.  With joint decision-making considering patient states she is having slow improvement with cough we will defer chest x-ray currently.  Patient was given instructions to reach out to office within 1 week if cough is still persisting or not improving we will opt for a chest x-ray to verify no etiology going on. She is prescribed an ICS inhaler per patient report she has not been taking.  Upon further questioning she does not have a history of pneumonia either. We discussed that viral coughs  can last for upwards to 4 weeks or more.  We will continue to monitor.         No orders of the defined types were placed in this encounter.    Romilda Garret, NP

## 2020-12-30 NOTE — Assessment & Plan Note (Addendum)
Discussed with patient different treatment options.  With joint decision-making considering patient states she is having slow improvement with cough we will defer chest x-ray currently.  Patient was given instructions to reach out to office within 1 week if cough is still persisting or not improving we will opt for a chest x-ray to verify no etiology going on. She is prescribed an ICS inhaler per patient report she has not been taking.  Upon further questioning she does not have a history of pneumonia either. We discussed that viral coughs can last for upwards to 4 weeks or more.  We will continue to monitor. Patient was given an information sheet of the different outpatient imaging centers since x-ray in office is currently down.  If we decide to pursue x-ray

## 2020-12-30 NOTE — Patient Instructions (Addendum)
Reach out to me either by phone or My Chart if your cough does not keep improving. If it does not continue to improve we will get a chest xray. Keep follow up as scheduled. Follow up if symptoms worsen or fail to improve

## 2021-01-03 ENCOUNTER — Other Ambulatory Visit: Payer: Self-pay | Admitting: Allergy

## 2021-01-07 ENCOUNTER — Telehealth: Payer: Self-pay | Admitting: Nurse Practitioner

## 2021-01-07 NOTE — Telephone Encounter (Signed)
Mychart message

## 2021-01-22 NOTE — Telephone Encounter (Signed)
Spoke with patient. Cough is much better, pretty much resolved. Patient does not have any other concerns at this time. Patient thanked [place] for reaching out.

## 2021-02-11 ENCOUNTER — Ambulatory Visit: Payer: Medicare Other | Admitting: Allergy

## 2021-02-12 NOTE — Progress Notes (Signed)
Gynecologic Oncology Return Clinic Visit  02/13/21  Reason for Visit: surveillance in the setting of history of Paget's disease  Treatment History: The patient previously had a supracervical hysterectomy but has her ovaries and cervix in situ. Surgery for Paget's disease of the vulva in October 2011.  There was focal involvement of the medial margin.  The entire lesion is approximately 5 cm in diameter.  She then underwent wide local excision of a recurrent Paget's lesion in August 2016.  She had surgical margins that were focally positive and subsequently underwent another excision on 04/10/2015 with negative margins. She had a vulvar biopsy from the right in November 2017 that was negative.   She was seen for a visible lesion and vulvar symptoms in April 2020 and a biopsy of this lesion was consistent with Paget's disease. On 11/07/2018, she underwent partial left vulvectomy with findings of a 1 cm white hyperkeratotic lesion on the left labia minora close to the clitoris.  Margins were positive.  Plan was for consideration of Aldara since repeat excision could involve removal of the clitoris. Patient was seen on 01/02/2019 with a slightly erythematous area on the right vulva.  Plan was for repeat examination in September with biopsy if persistence of the lesion. On 9/20, patient underwent right vulvar biopsy which revealed extramammary Paget's disease.  After discussion of treatment options, the patient opted for Aldara use. She was seen in early December, at which time her right vulvar lesion had completely resolved.  At this visit, a small right periclitoral lesion was noted.  Given her excellent response to the previous lesion, her decision was to proceed with Aldara use for this new lesion. Saw me on 07/23/19 with a right peri-clitoral lesion suspected to be side effect from Aldara cream use. Seen on 02/29/20 with no evidence of disease on exam after 3 months not using Aldara.  Seen in 08/2020,  asymptomatic, off treatment.  Interval History: Doing well since her last visit.  Denies any symptoms including vulvar pruritus, pain, erythema, discharge, or bleeding.  Her husband had their grandkids, ages 75 and 30, for a month this summer.  She hopes to be able to travel to the beach this fall.  Past Medical/Surgical History: 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  02-17-2011   PULLEY RELEASE RIGHT THUMB  03-23-2011   SIMPLE VULVECTOMY  03-24-2010   right side   TONSILLECTOMY  1977   TRIGGER FINGER RELEASE     VAGINAL HYSTERECTOMY  1989   and Anterior and posterior repair's for prolapse partial   VULVECTOMY Right 01/10/2015   Procedure: RIGHT WIDE LOCAL EXCISION VULVECTOMY;  Surgeon: [name], MD;  Location: [place];  Service: Gynecology;  Laterality: Right;   VULVECTOMY Right 04/10/2015   Procedure:  WIDE LOCAL EXCISION VULVA;  Surgeon: [name], MD;  Location: [facility];  Service: Gynecology;  Laterality: Right;   VULVECTOMY PARTIAL N/A 11/07/2018   Procedure: VULVECTOMY PARTIAL;  Surgeon: [name], MD;  Location: [facility];  Service: Gynecology;  Laterality: N/A;   WISDOM TOOTH EXTRACTION      Family History  Problem Relation Age of Onset   Cancer Maternal Grandmother    Hypertension Father    Heart disease Father    Asthma  Father    Hypertension Mother    Heart disease Mother    Breast cancer Paternal Grandmother 61   Colon cancer Paternal Grandmother    Colon cancer Paternal Uncle    Allergic rhinitis Neg Hx    Angioedema Neg Hx    Eczema Neg Hx    Immunodeficiency Neg Hx    Urticaria Neg Hx    Esophageal cancer Neg Hx    Rectal cancer Neg Hx    Stomach cancer Neg Hx     Social History   Socioeconomic History   Marital status: Married    Spouse name: Not on file   Number of children: 3   Years of education: Not on file   Highest education level: Not on file  Occupational History   Occupation: retired  Tobacco Use   Smoking status: Former    Years: 5.00    Types: Cigarettes    Quit date: 04/15/1989    Years since quitting: 31.8   Smokeless tobacco: Never  Vaping Use   Vaping Use: Never used  Substance and Sexual Activity   Alcohol use: Yes    Alcohol/week: 1.0 standard drink    Types: 1 Glasses of wine per week    Comment: occasional wine   Drug use: No   Sexual activity: Not on file  Other Topics Concern   Not on file  Social History Narrative   She is married.  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   Occasional wine, former smoker not now no substances   3 kids plus two step daughters   Social Determinants of Radio broadcast assistant Strain: Low Risk    Difficulty of Paying Living Expenses: Not hard at all  Food Insecurity: No Food Insecurity   Worried About Charity fundraiser in the Last Year: Never true   Arboriculturist in the Last Year: Never true  Transportation Needs: No Transportation Needs   Lack of Transportation (Medical): No   Lack of Transportation (Non-Medical): No  Physical Activity: Inactive   Days of Exercise per Week: 0 days   Minutes of Exercise per Session: 0 min  Stress: No Stress Concern Present   Feeling of Stress : Not at all  Social Connections: Not on file    Current Medications:  Current Outpatient Medications:    albuterol (VENTOLIN  HFA) 108 (90 Base) MCG/ACT inhaler, Inhale 2 puffs into the lungs every 6 (six) hours as needed for wheezing or shortness of breath., Disp: 8 g, Rfl: 2   ALVESCO 80 MCG/ACT inhaler, Inhale 2 puffs into the lungs 2 (two) times daily., Disp: 6.1 g, Rfl: 5   atorvastatin (LIPITOR) 10 MG tablet, Take 1 tablet (10 mg total) by mouth daily., Disp: 90 tablet, Rfl: 3   azelastine (ASTELIN) 0.1 % nasal spray, Place 2 sprays into both nostrils 2 (two) times daily., Disp: 30 mL, Rfl: 5   dicyclomine (BENTYL) 10 MG capsule, Take 1 capsule (10 mg total)  by mouth every 8 (eight) hours as needed for spasms (abdominal pain)., Disp: 90 capsule, Rfl: 5   famotidine (PEPCID) 40 MG tablet, TAKE 1/2 TABLET BY MOUTH TWICE DAILY, Disp: 90 tablet, Rfl: 3   fexofenadine (ALLERGY RELIEF) 180 MG tablet, Take 1 tablet (180 mg total) by mouth daily., Disp: 90 tablet, Rfl: 1   lansoprazole (PREVACID) 15 MG capsule, Take 1 capsule (15 mg total) by mouth 2 (two) times daily before a meal., Disp: , Rfl:    losartan-hydrochlorothiazide (HYZAAR) 100-25 MG tablet, TAKE 1 TABLET BY MOUTH EVERY DAY, Disp: 90 tablet, Rfl: 3   metoprolol tartrate (LOPRESSOR) 25 MG tablet, TAKE 1/2 TABLET(12.5 MG) BY MOUTH TWICE DAILY, Disp: 90 tablet, Rfl: 3   montelukast (SINGULAIR) 10 MG tablet, TAKE 1 TABLET(10 MG) BY MOUTH EVERY MORNING, Disp: 90 tablet, Rfl: 3   Triamcinolone & Emollient (DERMASORB TA) 0.1 % KIT, , Disp: , Rfl: 0   benzonatate (TESSALON PERLES) 100 MG capsule, Take 1 capsule (100 mg total) by mouth 3 (three) times daily as needed. (Patient not taking: Reported on 02/13/2021), Disp: 20 capsule, Rfl: 0   imiquimod (ALDARA) 5 % cream, Apply topically 3 (three) times a week. Apply to right vulva 3 times a week, use for 12 weeks (Patient not taking: Reported on 02/13/2021), Disp: 12 each, Rfl: 1   meloxicam (MOBIC) 7.5 MG tablet, Take 7.5 mg by mouth daily. (Patient not taking: Reported on 02/13/2021), Disp: , Rfl:   Review of Systems: Denies  appetite changes, fevers, chills, fatigue, unexplained weight changes. Denies hearing loss, neck lumps or masses, mouth sores, ringing in ears or voice changes. Denies cough or wheezing.  Denies shortness of breath. Denies chest pain or palpitations. Denies leg swelling. Denies abdominal distention, pain, blood in stools, constipation, diarrhea, nausea, vomiting, or early satiety. Denies pain with intercourse, dysuria, frequency, hematuria or incontinence. Denies hot flashes, pelvic pain, vaginal bleeding or vaginal discharge.   Denies joint pain, back pain or muscle pain/cramps. Denies itching, rash, or wounds. Denies dizziness, headaches, numbness or seizures. Denies swollen lymph nodes or glands, denies easy bruising or bleeding. Denies anxiety, depression, confusion, or decreased concentration.  Physical Exam: BP 119/76 (BP Location: Left Arm, Patient Position: Sitting)   Pulse 66   Temp 98.1 F (36.7 C) (Oral)   Resp 16   Ht 5' 4"  (1.626 m)   Wt 195 lb (88.5 kg)   SpO2 97%   BMI 33.47 kg/m  General: Alert, oriented, no acute distress. HEENT: Normocephalic, atraumatic, sclera anicteric. Chest: Unlabored breathing on room air. Abdomen: Obese, soft, nontender.  Normoactive bowel sounds.  No masses or hepatosplenomegaly appreciated.   Extremities: Grossly normal range of motion.  Warm, well perfused.  No edema bilaterally. Lymphatics: No cervical, supraclavicular, or inguinal adenopathy. GU: Normal appearing external genitalia without erythema, excoriation, or lesions.  Approximately 1-2 cm scarring along the right upper labia, no associated erythema or changes consistent with Paget's disease, unchanged in appearance from last visit.  Close inspection of the vulva reveals no other focal findings.  Laboratory & Radiologic Studies: None new  Assessment & Plan: [name] is a 70 y.o. woman with a history of recurrent Paget's disease of the vulva.   The patient has been  completely asymptomatic for the last 6 months.  She does intermittent visual checks and has not seen any lesions.  I do not see anything on exam other than the small area of scarring along the right upper labia that has been  present for her last several visits.  We will plan to continue with visits every 6 months.  She knows that if she develops any symptoms concerning for disease recurrence to call the clinic to see me sooner.  We also discussed that if she sees a lesion and has symptoms concerning for recurrence, she can start the Aldara cream prior to her visit with me.  32 minutes of total time was spent for this patient encounter, including preparation, face-to-face counseling with the patient and coordination of care, and documentation of the encounter.  Jeral Pinch, MD  Division of Gynecologic Oncology  Department of Obstetrics and Gynecology  [facility]

## 2021-02-13 ENCOUNTER — Encounter: Payer: Self-pay | Admitting: Family Medicine

## 2021-02-13 ENCOUNTER — Encounter: Payer: Self-pay | Admitting: Gynecologic Oncology

## 2021-02-13 ENCOUNTER — Other Ambulatory Visit: Payer: Self-pay

## 2021-02-13 ENCOUNTER — Inpatient Hospital Stay: Payer: Medicare Other | Attending: Gynecologic Oncology | Admitting: Gynecologic Oncology

## 2021-02-13 ENCOUNTER — Ambulatory Visit: Payer: Medicare Other | Admitting: Family Medicine

## 2021-02-13 VITALS — BP 119/76 | HR 66 | Temp 98.1°F | Resp 16 | Ht 64.0 in | Wt 195.0 lb

## 2021-02-13 VITALS — BP 136/70 | HR 68 | Temp 98.6°F | Resp 18 | Ht 64.0 in | Wt 196.2 lb

## 2021-02-13 DIAGNOSIS — J3089 Other allergic rhinitis: Secondary | ICD-10-CM

## 2021-02-13 DIAGNOSIS — Z8544 Personal history of malignant neoplasm of other female genital organs: Secondary | ICD-10-CM | POA: Diagnosis not present

## 2021-02-13 DIAGNOSIS — Z8616 Personal history of COVID-19: Secondary | ICD-10-CM | POA: Diagnosis not present

## 2021-02-13 DIAGNOSIS — C519 Malignant neoplasm of vulva, unspecified: Secondary | ICD-10-CM

## 2021-02-13 DIAGNOSIS — J4540 Moderate persistent asthma, uncomplicated: Secondary | ICD-10-CM | POA: Insufficient documentation

## 2021-02-13 DIAGNOSIS — Z90710 Acquired absence of both cervix and uterus: Secondary | ICD-10-CM | POA: Insufficient documentation

## 2021-02-13 DIAGNOSIS — J302 Other seasonal allergic rhinitis: Secondary | ICD-10-CM | POA: Insufficient documentation

## 2021-02-13 DIAGNOSIS — K2100 Gastro-esophageal reflux disease with esophagitis, without bleeding: Secondary | ICD-10-CM | POA: Diagnosis not present

## 2021-02-13 MED ORDER — ALVESCO 80 MCG/ACT IN AERS: 2.0000 | INHALATION_SPRAY | Freq: Two times a day (BID) | RESPIRATORY_TRACT | 5 refills | Status: DC

## 2021-02-13 MED ORDER — FEXOFENADINE HCL 180 MG PO TABS: 180.0000 mg | ORAL_TABLET | Freq: Every day | ORAL | 1 refills | Status: DC

## 2021-02-13 NOTE — Progress Notes (Signed)
[address] [place]  [address] Dept: [id number]  FOLLOW UP NOTE  Patient ID: [name], female    DOB: 11/18/1950  Age: 70 y.o. MRN: [id number] Date of Office Visit: 02/13/2021  Assessment  Chief Complaint: Follow-up  HPI [name] is a 70 year old female who presents to the clinic for follow-up visit.  She was last seen in this clinic on 10/08/2020 by Dr. Nelva Bush for evaluation of asthma, allergic rhinitis, cough, and reflux.   In the interim, she did have COVID in July with cough lasting for 2 weeks or more.  At today's visit, she reports her asthma has been moderately well controlled with wheezing occurring 1 to 2 days a week with activity and rest and dry hacking cough occurring daily.  She denies shortness of breath with activity and rest.  She reports that dry hacking cough has been going on for years and is  "normal" for her.  She continues montelukast 10 mg once a day and uses Alvesco 80-1 or 2 puffs about every other day.  She has not used her albuterol since her last visit to this clinic.  Allergic rhinitis is reported as moderately well controlled with occasional sneeze and occasional postnasal drainage.  She continues Allegra 180 mg once a day and occasional saline nasal rinses.  She is not currently using Flonase or azelastine nasal sprays.  She last had skin testing to environmental allergies on 07/09/2020 and was positive to grass pollen, weed pollen, tree pollen, cat, and mold. Reflux is reported as well controlled with no symptoms including heartburn or vomiting.  She continues lansoprazole 15 mg twice a day and famotidine 20 mg twice a day.  Her current medications are listed in the chart.  Drug Allergies:  Allergies  Allergen Reactions   Ibuprofen Hypertension   Naprosyn [Naproxen] Nausea Only   Other     Allergic to toothpaste- makes mouth peel Hay fever/dust mites/trees "365 allergic"    Sevoflurane Nausea And Vomiting   Azithromycin Itching and Rash    Sulfa Antibiotics Hives and Rash    Physical Exam: BP 136/70   Pulse 68   Temp 98.6 F (37 C) (Temporal)   Resp 18   Ht '5\' 4"'$  (1.626 m)   Wt 196 lb 4 oz (89 kg)   SpO2 98%   BMI 33.69 kg/m    Physical Exam Vitals reviewed.  Constitutional:      Appearance: Normal appearance.  HENT:     Head: Normocephalic and atraumatic.     Right Ear: Tympanic membrane normal.     Left Ear: Tympanic membrane normal.     Nose:     Comments: Bilateral nares slightly erythematous with clear nasal drainage noted.  Pharynx normal.  Ears normal.  Eyes normal.    Mouth/Throat:     Pharynx: Oropharynx is clear.  Eyes:     Conjunctiva/sclera: Conjunctivae normal.  Cardiovascular:     Rate and Rhythm: Normal rate and regular rhythm.     Heart sounds: Normal heart sounds. No murmur heard. Pulmonary:     Effort: Pulmonary effort is normal.     Breath sounds: Normal breath sounds.     Comments: Lungs clear to auscultation Musculoskeletal:        General: Normal range of motion.     Cervical back: Normal range of motion and neck supple.  Skin:    General: Skin is warm and dry.  Neurological:     Mental Status: She is alert  and oriented to person, place, and time.  Psychiatric:        Mood and Affect: Mood normal.        Behavior: Behavior normal.        Thought Content: Thought content normal.        Judgment: Judgment normal.    Diagnostics: FVC 2.11, FEV1 1.77.  Predicted FVC 3.09, predicted FEV1 2.35.  Spirometry indicates mild restriction.  Postbronchodilator FVC 2.31, FEV1 1.96.  Postbronchodilator spirometry indicates 11% improvement in FEV1.  Assessment and Plan: 1. Moderate persistent asthma without complication   2. Seasonal and perennial allergic rhinitis   3. Gastroesophageal reflux disease with esophagitis, unspecified whether hemorrhage   4. History of COVID-19     Meds ordered this encounter  Medications   ALVESCO 80 MCG/ACT inhaler    Sig: Inhale 2 puffs into the  lungs 2 (two) times daily.    Dispense:  6.1 g    Refill:  5    Please use attached coupon BIN [id number], PCN [id number], GRP [id number], ID [id number].   fexofenadine (ALLERGY RELIEF) 180 MG tablet    Sig: Take 1 tablet (180 mg total) by mouth daily.    Dispense:  90 tablet    Refill:  1     Patient Instructions  Asthma Continue montelukast 10 mg once a day to prevent cough or wheeze Increase Alvesco 80 to 2 puffs twice a day with a spacer to prevent cough or wheeze Continue albuterol 2 puffs once every 4 hours as needed for cough or wheeze You may use albuterol 2 puffs 5 to 15 minutes before activity to decrease cough or wheeze  Allergic rhinitis Continue allergen avoidance measures directed toward pollens, cat, and mold as listed below Continue Allegra 180 mg once a day as needed for runny nose or itch Continue azelastine 2 sprays in each nostril up to twice a day as needed for runny nose Continue Flonase 2 sprays in each nostril once a day as needed for stuffy nose. In the right nostril, point the applicator out toward the right ear. In the left nostril, point the applicator out toward the left ear Consider saline nasal rinses as needed for nasal symptoms. Use this before any medicated nasal sprays for best result  Reflux Continue dietary and lifestyle modifications as listed below Continue lansoprazole 15 mg twice a day and famotidine 20 mg twice a day  Call the clinic if this treatment plan is not working well for you.  Follow up in 3 months or sooner if needed.  Return in about 3 months (around 05/15/2021), or if symptoms worsen or fail to improve.    Thank you for the opportunity to care for this patient.  Please do not hesitate to contact me with questions.  Gareth Morgan, FNP [facility]

## 2021-02-13 NOTE — Patient Instructions (Addendum)
It was great to see you today!  I do not see anything that looks concerning for recurrence of your Paget's.  We will continue with visits every 6 months.  If you develop symptoms or see a new spot before your next visit with me, please call to see me sooner.

## 2021-02-13 NOTE — Patient Instructions (Signed)
Asthma Continue montelukast 10 mg once a day to prevent cough or wheeze Increase Alvesco 80 to 2 puffs twice a day with a spacer to prevent cough or wheeze Continue albuterol 2 puffs once every 4 hours as needed for cough or wheeze You may use albuterol 2 puffs 5 to 15 minutes before activity to decrease cough or wheeze  Allergic rhinitis Continue allergen avoidance measures directed toward pollens, cat, and mold as listed below Continue Allegra 180 mg once a day as needed for runny nose or itch Continue azelastine 2 sprays in each nostril up to twice a day as needed for runny nose Continue Flonase 2 sprays in each nostril once a day as needed for stuffy nose. In the right nostril, point the applicator out toward the right ear. In the left nostril, point the applicator out toward the left ear Consider saline nasal rinses as needed for nasal symptoms. Use this before any medicated nasal sprays for best result  Reflux Continue dietary and lifestyle modifications as listed below Continue lansoprazole 15 mg twice a day and famotidine 20 mg twice a day  Call the clinic if this treatment plan is not working well for you.  Follow up in 3 months or sooner if needed.  Reducing Pollen Exposure The American Academy of Allergy, Asthma and Immunology suggests the following steps to reduce your exposure to pollen during allergy seasons. Do not hang sheets or clothing out to dry; pollen may collect on these items. Do not mow lawns or spend time around freshly cut grass; mowing stirs up pollen. Keep windows closed at night.  Keep car windows closed while driving. Minimize morning activities outdoors, a time when pollen counts are usually at their highest. Stay indoors as much as possible when pollen counts or humidity is high and on windy days when pollen tends to remain in the air longer. Use air conditioning when possible.  Many air conditioners have filters that trap the pollen spores. Use a HEPA  room air filter to remove pollen form the indoor air you breathe.  Control of Dog or Cat Allergen Avoidance is the best way to manage a dog or cat allergy. If you have a dog or cat and are allergic to dog or cats, consider removing the dog or cat from the home. If you have a dog or cat but don't want to find it a new home, or if your family wants a pet even though someone in the household is allergic, here are some strategies that may help keep symptoms at bay:  Keep the pet out of your bedroom and restrict it to only a few rooms. Be advised that keeping the dog or cat in only one room will not limit the allergens to that room. Don't pet, hug or kiss the dog or cat; if you do, wash your hands with soap and water. High-efficiency particulate air (HEPA) cleaners run continuously in a bedroom or living room can reduce allergen levels over time. Regular use of a high-efficiency vacuum cleaner or a central vacuum can reduce allergen levels. Giving your dog or cat a bath at least once a week can reduce airborne allergen.  Control of Mold Allergen Mold and fungi can grow on a variety of surfaces provided certain temperature and moisture conditions exist.  Outdoor molds grow on plants, decaying vegetation and soil.  The major outdoor mold, Alternaria and Cladosporium, are found in very high numbers during hot and dry conditions.  Generally, a late Summer - Fall  peak is seen for common outdoor fungal spores.  Rain will temporarily lower outdoor mold spore count, but counts rise rapidly when the rainy period ends.  The most important indoor molds are Aspergillus and Penicillium.  Dark, humid and poorly ventilated basements are ideal sites for mold growth.  The next most common sites of mold growth are the bathroom and the kitchen.  Outdoor [employer] Use air conditioning and keep windows closed Avoid exposure to decaying vegetation. Avoid leaf raking. Avoid grain handling. Consider wearing a face mask if  working in moldy areas.  Indoor Mold Control Maintain humidity below 50%. Clean washable surfaces with 5% bleach solution. Remove sources e.g. Contaminated carpets.

## 2021-03-13 ENCOUNTER — Ambulatory Visit (INDEPENDENT_AMBULATORY_CARE_PROVIDER_SITE_OTHER): Payer: Medicare Other | Admitting: Family Medicine

## 2021-03-13 ENCOUNTER — Other Ambulatory Visit: Payer: Self-pay

## 2021-03-13 ENCOUNTER — Encounter: Payer: Self-pay | Admitting: Family Medicine

## 2021-03-13 VITALS — Ht 64.0 in | Wt 195.0 lb

## 2021-03-13 DIAGNOSIS — U071 COVID-19: Secondary | ICD-10-CM

## 2021-03-13 MED ORDER — NIRMATRELVIR/RITONAVIR (PAXLOVID)TABLET: 3.0000 | ORAL_TABLET | Freq: Two times a day (BID) | ORAL | 0 refills | Status: AC

## 2021-03-13 NOTE — Progress Notes (Signed)
TELEPHONE VISIT  Due to national recommendations of social distancing due to [place] 19, Audio telehealth visit is felt to be most appropriate for this patient at this time.   I connected with AIMAR SHREWSBURY on 03/13/21 at  3:40 PM EDT by telephone and verified that I am speaking with the correct person using two identifiers.   I discussed the limitations, risks, security and privacy concerns of performing an evaluation and management service by telephone and the availability of in person appointments. I also discussed with the patient that there may be a patient responsible charge related to this service. The patient expressed understanding and agreed to proceed.  Patient location: Home Provider Location: [place] Participants: Benigna Delisi Diona Browner and ALAINNA STAWICKI   History of Present Illness: 70 year old female patient with history of asthma presents with COVID infection.  She reports onset of symptoms on 10/3 following exposure at a wedding.   She started with ST, clearing throat, post nasal drip.. followed by coughing, productive.  Mild headache, fatigue, no body aches.  Low grade temperature 100 F.  She is having some wheeze and mild SOB.  No chest tightness, no chest pain.  She is using Alvesco 2 puff twice daily inhaler.. this has helped the wheeze.  Has not had  wheeze since restarting Alvesco.   GFR 70   COVID 19 screen COVID testing: home 107/2022 COVID vaccine: No COVID vaccines COVID exposure: No recent travel or known exposure to [place]  The importance of social distancing was discussed today.    Review of Systems  All other systems reviewed and are negative.    Past Medical History:  Diagnosis Date   Allergic rhinitis    Arthritis    Asthma    mild no inaler use   GERD (gastroesophageal reflux disease)    Headache(784.0)    Migraines   History of adenomatous polyp of colon    History of concussion    AS CHILD--  NO RESIDUAL   History of  palpitations    Hypertension    Paget's disease of vulva (HCC)    PONV (postoperative nausea and vomiting)    SEVERE   Wears glasses     reports that she quit smoking about 31 years ago. Her smoking use included cigarettes. She has never used smokeless tobacco. She reports current alcohol use of about 1.0 standard drink per week. She reports that she does not use drugs.   Current Outpatient Medications:    albuterol (VENTOLIN HFA) 108 (90 Base) MCG/ACT inhaler, Inhale 2 puffs into the lungs every 6 (six) hours as needed for wheezing or shortness of breath., Disp: 8 g, Rfl: 2   ALVESCO 80 MCG/ACT inhaler, Inhale 2 puffs into the lungs 2 (two) times daily., Disp: 6.1 g, Rfl: 5   atorvastatin (LIPITOR) 10 MG tablet, Take 1 tablet (10 mg total) by mouth daily., Disp: 90 tablet, Rfl: 3   azelastine (ASTELIN) 0.1 % nasal spray, Place 2 sprays into both nostrils 2 (two) times daily., Disp: 30 mL, Rfl: 5   dicyclomine (BENTYL) 10 MG capsule, Take 1 capsule (10 mg total) by mouth every 8 (eight) hours as needed for spasms (abdominal pain)., Disp: 90 capsule, Rfl: 5   famotidine (PEPCID) 40 MG tablet, TAKE 1/2 TABLET BY MOUTH TWICE DAILY, Disp: 90 tablet, Rfl: 3   fexofenadine (ALLERGY RELIEF) 180 MG tablet, Take 1 tablet (180 mg total) by mouth daily., Disp: 90 tablet, Rfl: 1   imiquimod (ALDARA)  5 % cream, Apply topically 3 (three) times a week. Apply to right vulva 3 times a week, use for 12 weeks, Disp: 12 each, Rfl: 1   lansoprazole (PREVACID) 15 MG capsule, Take 1 capsule (15 mg total) by mouth 2 (two) times daily before a meal., Disp: , Rfl:    losartan-hydrochlorothiazide (HYZAAR) 100-25 MG tablet, TAKE 1 TABLET BY MOUTH EVERY DAY, Disp: 90 tablet, Rfl: 3   meloxicam (MOBIC) 7.5 MG tablet, Take 7.5 mg by mouth daily., Disp: , Rfl:    metoprolol tartrate (LOPRESSOR) 25 MG tablet, TAKE 1/2 TABLET(12.5 MG) BY MOUTH TWICE DAILY, Disp: 90 tablet, Rfl: 3   montelukast (SINGULAIR) 10 MG tablet, TAKE 1  TABLET(10 MG) BY MOUTH EVERY MORNING, Disp: 90 tablet, Rfl: 3   Triamcinolone & Emollient (DERMASORB TA) 0.1 % KIT, , Disp: , Rfl: 0   benzonatate (TESSALON PERLES) 100 MG capsule, Take 1 capsule (100 mg total) by mouth 3 (three) times daily as needed. (Patient not taking: No sig reported), Disp: 20 capsule, Rfl: 0   Observations/Objective: Height 5' 4"  (1.626 m), weight 195 lb (88.5 kg).  Physical Exam  Physical Exam Constitutional:      General: The patient is not in acute distress. Pulmonary:     Effort: Pulmonary effort is normal. No respiratory distress.  Neurological:     Mental Status: The patient is alert and oriented to person, place, and time.  Psychiatric:        Mood and Affect: Mood normal.        Behavior: Behavior normal.   Assessment and Plan Problem List Items Addressed This Visit     COVID-19 - Primary    COVID19  Infection < 5 days from onset of symptoms in UNvaccinated overweight individual with history of  asthma  No clear sign of bacterial infection at this time.   No SOB.  No red flags/need for ER visit or in-person exam at respiratory clinic at this time..    Pt higher risk for COVID complications given not vaccinated, asthma, age > 35  and overweight status. GFR  >60 and no medication contraindications.  Start paxlovid 5 day course. Reviewed course of medication and side effect profile with patient in detail.   Symptomatic care with mucinex and cough suppressant at night. If SOB begins symptoms worsening.. have low threshold for in-person exam, if severe shortness of breath ER visit recommended.  Can monitor Oxygen saturation at home with home monitor if able to obtain.  Go to ER if O2 sat < 90% on room air.   Reviewed home care and provided information through [facility].  Recommended quarantine 5 days isolation recommended. Return to work day 6 and wear mask for 4 more days to complete 10 days. Provided info about prevention of spread of COVID 19.         I discussed the assessment and treatment plan with the patient. The patient was provided an opportunity to ask questions and all were answered. The patient agreed with the plan and demonstrated an understanding of the instructions.   The patient was advised to call back or seek an in-person evaluation if the symptoms worsen or if the condition fails to improve as anticipated.  I provided 13 minutes of non-face-to-face time during this encounter.   Eliezer Lofts, MD

## 2021-03-14 ENCOUNTER — Other Ambulatory Visit: Payer: Self-pay | Admitting: Nurse Practitioner

## 2021-03-15 ENCOUNTER — Other Ambulatory Visit: Payer: Self-pay | Admitting: Nurse Practitioner

## 2021-03-25 DIAGNOSIS — M531 Cervicobrachial syndrome: Secondary | ICD-10-CM | POA: Diagnosis not present

## 2021-03-25 DIAGNOSIS — M9901 Segmental and somatic dysfunction of cervical region: Secondary | ICD-10-CM | POA: Diagnosis not present

## 2021-03-25 DIAGNOSIS — M546 Pain in thoracic spine: Secondary | ICD-10-CM | POA: Diagnosis not present

## 2021-03-25 DIAGNOSIS — M9902 Segmental and somatic dysfunction of thoracic region: Secondary | ICD-10-CM | POA: Diagnosis not present

## 2021-03-28 ENCOUNTER — Other Ambulatory Visit: Payer: Self-pay | Admitting: Family Medicine

## 2021-04-01 DIAGNOSIS — U071 COVID-19: Secondary | ICD-10-CM | POA: Insufficient documentation

## 2021-04-01 NOTE — Assessment & Plan Note (Signed)
COVID19  Infection < 5 days from onset of symptoms in UNvaccinated overweight individual with history of  asthma  No clear sign of bacterial infection at this time.   No SOB.  No red flags/need for ER visit or in-person exam at respiratory clinic at this time..    Pt higher risk for COVID complications given not vaccinated, asthma, age > 65  and overweight status. GFR  >60 and no medication contraindications.  Start paxlovid 5 day course. Reviewed course of medication and side effect profile with patient in detail.   Symptomatic care with mucinex and cough suppressant at night. If SOB begins symptoms worsening.. have low threshold for in-person exam, if severe shortness of breath ER visit recommended.  Can monitor Oxygen saturation at home with home monitor if able to obtain.  Go to ER if O2 sat < 90% on room air.   Reviewed home care and provided information through MyChart.  Recommended quarantine 5 days isolation recommended. Return to work day 6 and wear mask for 4 more days to complete 10 days. Provided info about prevention of spread of COVID 19.  

## 2021-04-07 DIAGNOSIS — M531 Cervicobrachial syndrome: Secondary | ICD-10-CM | POA: Diagnosis not present

## 2021-04-07 DIAGNOSIS — M9901 Segmental and somatic dysfunction of cervical region: Secondary | ICD-10-CM | POA: Diagnosis not present

## 2021-04-07 DIAGNOSIS — M9902 Segmental and somatic dysfunction of thoracic region: Secondary | ICD-10-CM | POA: Diagnosis not present

## 2021-04-07 DIAGNOSIS — M546 Pain in thoracic spine: Secondary | ICD-10-CM | POA: Diagnosis not present

## 2021-04-11 ENCOUNTER — Other Ambulatory Visit: Payer: Self-pay | Admitting: Family Medicine

## 2021-04-15 ENCOUNTER — Other Ambulatory Visit: Payer: Self-pay | Admitting: Allergy

## 2021-04-22 DIAGNOSIS — M531 Cervicobrachial syndrome: Secondary | ICD-10-CM | POA: Diagnosis not present

## 2021-04-22 DIAGNOSIS — M546 Pain in thoracic spine: Secondary | ICD-10-CM | POA: Diagnosis not present

## 2021-04-22 DIAGNOSIS — M9901 Segmental and somatic dysfunction of cervical region: Secondary | ICD-10-CM | POA: Diagnosis not present

## 2021-04-22 DIAGNOSIS — M9902 Segmental and somatic dysfunction of thoracic region: Secondary | ICD-10-CM | POA: Diagnosis not present

## 2021-04-29 ENCOUNTER — Telehealth: Payer: Self-pay | Admitting: Family Medicine

## 2021-04-29 ENCOUNTER — Other Ambulatory Visit: Payer: Self-pay | Admitting: *Deleted

## 2021-04-29 MED ORDER — FEXOFENADINE HCL 180 MG PO TABS: ORAL_TABLET | ORAL | 4 refills | Status: DC

## 2021-04-29 MED ORDER — FLUTICASONE PROPIONATE HFA 110 MCG/ACT IN AERO: 2.0000 | INHALATION_SPRAY | Freq: Two times a day (BID) | RESPIRATORY_TRACT | 5 refills | Status: DC

## 2021-04-29 MED ORDER — ALBUTEROL SULFATE HFA 108 (90 BASE) MCG/ACT IN AERS: 2.0000 | INHALATION_SPRAY | Freq: Four times a day (QID) | RESPIRATORY_TRACT | 2 refills | Status: DC | PRN

## 2021-04-29 NOTE — Addendum Note (Signed)
Addended by: Felipa Emory on: 04/29/2021 05:11 PM   Modules accepted: Orders

## 2021-04-29 NOTE — Telephone Encounter (Signed)
Fexofenadine has been sent in. Can you please advise on some alternatives for inhalers and we can try to look at coverage?

## 2021-04-29 NOTE — Telephone Encounter (Signed)
Patient never picked up her fexofenadine from the pharmacy and would like to know if it could be resent to the [facility] in [place] on [address]. She would also like to discuss an inhaler. She was prescribed Alvesco and does not like it. She would like another one, but said she has paid $70 in the past for an inhaler and she doesn't want that again. Last seen 02/13/21.

## 2021-04-29 NOTE — Telephone Encounter (Signed)
Can you please send in Flovent 110-2 puffs twice a day with a spacer for her. This will replace Alvesco. Please have her use albuterol 2 puffs once every 4 hours as needed. Have her call with any worsening of symptoms. Thank you

## 2021-04-29 NOTE — Telephone Encounter (Signed)
Pt is going to pick up albuterol today

## 2021-04-29 NOTE — Telephone Encounter (Signed)
Pt called back and stated the alvesco caused her to be horse she believes but not 100% sure that is what she made. She has been coughing a lot lately but she has no albtuerol inhaler. Sent in albuterol refill for her.

## 2021-04-29 NOTE — Telephone Encounter (Signed)
Sure. In reviewing her chart I only see Alvesco and albuterol inhalers. If she can be more specific about which inhalers she has tried in the past we can eliminate them right away. Also please find out what she didn't like about Alvesco so we don't order something similar. Thank you

## 2021-04-29 NOTE — Telephone Encounter (Signed)
Pts informed of the change and stated understanding sent in rx

## 2021-04-29 NOTE — Telephone Encounter (Signed)
Called and left a voicemail asking for patient to return call to discuss.  °

## 2021-05-13 ENCOUNTER — Encounter: Payer: Self-pay | Admitting: Family

## 2021-05-13 ENCOUNTER — Ambulatory Visit (INDEPENDENT_AMBULATORY_CARE_PROVIDER_SITE_OTHER): Payer: Medicare Other | Admitting: Family

## 2021-05-13 ENCOUNTER — Other Ambulatory Visit: Payer: Self-pay

## 2021-05-13 VITALS — BP 124/76 | HR 63 | Temp 97.4°F | Ht 64.0 in | Wt 196.0 lb

## 2021-05-13 DIAGNOSIS — M531 Cervicobrachial syndrome: Secondary | ICD-10-CM | POA: Diagnosis not present

## 2021-05-13 DIAGNOSIS — H60501 Unspecified acute noninfective otitis externa, right ear: Secondary | ICD-10-CM | POA: Diagnosis not present

## 2021-05-13 DIAGNOSIS — M9902 Segmental and somatic dysfunction of thoracic region: Secondary | ICD-10-CM | POA: Diagnosis not present

## 2021-05-13 DIAGNOSIS — H9311 Tinnitus, right ear: Secondary | ICD-10-CM

## 2021-05-13 DIAGNOSIS — M546 Pain in thoracic spine: Secondary | ICD-10-CM | POA: Diagnosis not present

## 2021-05-13 DIAGNOSIS — M9901 Segmental and somatic dysfunction of cervical region: Secondary | ICD-10-CM | POA: Diagnosis not present

## 2021-05-13 MED ORDER — OFLOXACIN 0.3 % OT SOLN: 10.0000 [drp] | Freq: Every day | OTIC | 0 refills | Status: AC

## 2021-05-13 NOTE — Assessment & Plan Note (Signed)
Suspect may be due to new start ICS (alvesco/flovent), pt to f/u with her allergist this Friday to discuss possible alternatives. Continue allegra, singulair, and azelastine for allergic rhinitis. Refer ent as well. Slight irritation ear canal, drops have been prescribed, do not suspect this is cause of tinnitus.

## 2021-05-13 NOTE — Assessment & Plan Note (Signed)
Ear drops prescribed.

## 2021-05-13 NOTE — Progress Notes (Signed)
Established Patient Office Visit  Subjective:  Patient ID: [name], female    DOB: 04-20-1951  Age: 70 y.o. MRN: [id number]  CC: No chief complaint on file.   HPI [name] is here today with c/o humming in her ears, only on the right side. She describes it as a buzz initially, and now a humming. At night she hears this more clearly. No pain in the ear. She does not think she has a foreign object in the ear, but she does have a h/o some excessive ear wax. No known trauma and or known injury to the ear.   Did feel some dizziness around thanksgiving but that has resolved. Only new medication she started around that time was flovent, which does make her hoarse for asthma prescribed by her allergy./ she was initially on alvesco which caused hoarseness and a cough, and even with switching to flovent there has been no improvement with hoarseness (coughing as improved). She isn't taking flovent daily because of this. She does rinse her mouth out after everytime.  Still taking allegra, singulair and azelastine for allergies. She sees her allergist Friday for management of asthma.    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  02-17-2011   PULLEY RELEASE RIGHT THUMB  03-23-2011   SIMPLE VULVECTOMY  03-24-2010   right side   TONSILLECTOMY  1977   TRIGGER FINGER RELEASE     VAGINAL HYSTERECTOMY  1989   and  Anterior and posterior repair's for prolapse partial   VULVECTOMY Right 01/10/2015   Procedure: RIGHT WIDE LOCAL EXCISION VULVECTOMY;  Surgeon: [name], MD;  Location: [facility];  Service: Gynecology;  Laterality: Right;   VULVECTOMY Right 04/10/2015   Procedure: WIDE LOCAL EXCISION VULVA;  Surgeon: [name], MD;  Location: [facility];  Service: Gynecology;  Laterality: Right;   VULVECTOMY PARTIAL N/A 11/07/2018   Procedure: VULVECTOMY PARTIAL;  Surgeon: [name], MD;  Location: [facility];  Service: Gynecology;  Laterality: N/A;   WISDOM TOOTH EXTRACTION      Family History  Problem Relation Age of Onset   Cancer Maternal Grandmother    Hypertension Father    Heart disease Father    Asthma Father    Hypertension Mother    Heart disease Mother    Breast cancer Paternal Grandmother 26   Colon cancer Paternal Grandmother    Colon cancer Paternal Uncle    Allergic rhinitis Neg Hx    Angioedema Neg Hx    Eczema Neg Hx    Immunodeficiency Neg Hx    Urticaria Neg Hx    Esophageal cancer Neg Hx    Rectal cancer Neg Hx    Stomach cancer Neg Hx     Social  History   Socioeconomic History   Marital status: Married    Spouse name: Not on file   Number of children: 3   Years of education: Not on file   Highest education level: Not on file  Occupational History   Occupation: retired  Tobacco Use   Smoking status: Former    Years: 5.00    Types: Cigarettes    Quit date: 04/15/1989    Years since quitting: 32.0   Smokeless tobacco: Never  Vaping Use   Vaping Use: Never used  Substance and Sexual Activity   Alcohol use: Yes    Alcohol/week: 1.0 standard drink    Types: 1 Glasses of wine per week    Comment: occasional wine   Drug use: No   Sexual activity: Not on file  Other Topics Concern   Not on file  Social History Narrative   She is married.  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   Occasional  wine, former smoker not now no substances   3 kids plus two step daughters   Social Determinants of Radio broadcast assistant Strain: Low Risk    Difficulty of Paying Living Expenses: Not hard at all  Food Insecurity: No Food Insecurity   Worried About Charity fundraiser in the Last Year: Never true   Arboriculturist in the Last Year: Never true  Transportation Needs: No Transportation Needs   Lack of Transportation (Medical): No   Lack of Transportation (Non-Medical): No  Physical Activity: Inactive   Days of Exercise per Week: 0 days   Minutes of Exercise per Session: 0 min  Stress: No Stress Concern Present   Feeling of Stress : Not at all  Social Connections: Not on file  Intimate Partner Violence: Not At Risk   Fear of Current or Ex-Partner: No   Emotionally Abused: No   Physically Abused: No   Sexually Abused: No    Outpatient Medications Prior to Visit  Medication Sig Dispense Refill   albuterol (VENTOLIN HFA) 108 (90 Base) MCG/ACT inhaler Inhale 2 puffs into the lungs every 6 (six) hours as needed for wheezing or shortness of breath. 8 g 2   ALVESCO 80 MCG/ACT inhaler Inhale 2 puffs into the lungs 2 (two) times daily. 6.1 g 5   atorvastatin (LIPITOR) 10 MG tablet Take 1 tablet (10 mg total) by mouth daily. 90 tablet 3   azelastine (ASTELIN) 0.1 % nasal spray Place 2 sprays into both nostrils 2 (two) times daily. 30 mL 5   dicyclomine (BENTYL) 10 MG capsule TAKE 1 CAPSULE(10 MG) BY MOUTH EVERY 8 HOURS 30 capsule 0   famotidine (PEPCID) 40 MG tablet TAKE 1/2 TABLET BY MOUTH TWICE DAILY 90 tablet 3   fexofenadine (ALLERGY RELIEF) 180 MG tablet TAKE 1 TABLET(180 MG) BY MOUTH DAILY 30 tablet 4   fluticasone (FLOVENT HFA) 110 MCG/ACT inhaler Inhale 2 puffs into the lungs 2 (two) times daily. 1 each 5   imiquimod (ALDARA) 5 % cream Apply topically 3 (three) times a week. Apply to right vulva 3 times a week, use for 12 weeks 12 each 1   lansoprazole (PREVACID) 15 MG capsule  Take 1 capsule (15 mg total) by mouth 2 (two) times daily before a meal.     losartan-hydrochlorothiazide (HYZAAR) 100-25 MG tablet TAKE 1 TABLET BY MOUTH EVERY DAY 90 tablet 0   metoprolol tartrate (LOPRESSOR) 25 MG tablet TAKE 1/2 TABLET(12.5 MG) BY MOUTH TWICE DAILY 90 tablet 0  montelukast (SINGULAIR) 10 MG tablet TAKE 1 TABLET(10 MG) BY MOUTH EVERY MORNING 90 tablet 3   benzonatate (TESSALON PERLES) 100 MG capsule Take 1 capsule (100 mg total) by mouth 3 (three) times daily as needed. (Patient not taking: No sig reported) 20 capsule 0   meloxicam (MOBIC) 7.5 MG tablet Take 7.5 mg by mouth daily.     No facility-administered medications prior to visit.    Allergies  Allergen Reactions   Ibuprofen Hypertension   Naprosyn [Naproxen] Nausea Only   Other     Allergic to toothpaste- makes mouth peel Hay fever/dust mites/trees "365 allergic"    Sevoflurane Nausea And Vomiting   Azithromycin Itching and Rash   Sulfa Antibiotics Hives and Rash    ROS Review of Systems  Constitutional:  Negative for chills and fever.  HENT:  Negative for congestion, ear discharge, ear pain, sinus pressure, sinus pain, sneezing and sore throat.        Right sided ear buzzing humming Hoarse throat since starting ICS  Respiratory:  Negative for shortness of breath.   Cardiovascular:  Negative for chest pain.  Neurological:  Positive for dizziness (since resolved x 2 weeks ago). Negative for headaches.     Objective:    Physical Exam Constitutional:      General: She is not in acute distress.    Appearance: She is obese. She is not ill-appearing or toxic-appearing.  HENT:     Head: Normocephalic.     Right Ear: External ear normal. Decreased hearing noted. No swelling or tenderness. A middle ear effusion (mild clear fluid behind TM) is present. There is no impacted cerumen. No foreign body. Tympanic membrane is not perforated or erythematous.     Left Ear: Hearing and tympanic membrane normal.      Ears:     Comments: Ear canal with mild irritation/erythema    Nose: Nose normal.     Mouth/Throat:     Mouth: Mucous membranes are moist.     Pharynx: No posterior oropharyngeal erythema.  Cardiovascular:     Rate and Rhythm: Normal rate and regular rhythm.  Neurological:     Mental Status: She is alert.    BP 124/76   Pulse 63   Temp (!) 97.4 F (36.3 C) (Temporal)   Ht 5\' 4"  (1.626 m)   Wt 196 lb (88.9 kg)   SpO2 95%   BMI 33.64 kg/m  Wt Readings from Last 3 Encounters:  05/13/21 196 lb (88.9 kg)  03/13/21 195 lb (88.5 kg)  02/13/21 195 lb (88.5 kg)     Health Maintenance Due  Topic Date Due   Zoster Vaccines- Shingrix (1 of 2) Never done   MAMMOGRAM  07/30/2020   INFLUENZA VACCINE  01/05/2021    There are no preventive care reminders to display for this patient.  Lab Results  Component Value Date   TSH 1.39 12/13/2017   Lab Results  Component Value Date   WBC 7.6 02/13/2020   HGB 14.4 02/13/2020   HCT 42.2 02/13/2020   MCV 95.2 02/13/2020   PLT 245.0 02/13/2020   Lab Results  Component Value Date   NA 140 09/17/2020   K 3.7 09/17/2020   CO2 30 09/17/2020   GLUCOSE 87 09/17/2020   BUN 16 09/17/2020   CREATININE 0.85 09/17/2020   BILITOT 0.7 09/17/2020   ALKPHOS 43 09/17/2020   AST 19 09/17/2020   ALT 24 09/17/2020   PROT 7.0 09/17/2020   ALBUMIN 4.0 09/17/2020  CALCIUM 9.5 09/17/2020   ANIONGAP 10 08/10/2011   GFR 70.76 09/17/2020   No results found for: HGBA1C    Assessment & Plan:   Problem List Items Addressed This Visit       Nervous and Auditory   Acute otitis externa of right ear    Ear drops prescribed.       Relevant Medications   ofloxacin (FLOXIN OTIC) 0.3 % OTIC solution     Other   New onset tinnitus of right ear - Primary    Suspect may be due to new start ICS (alvesco/flovent), pt to f/u with her allergist this Friday to discuss possible alternatives. Continue allegra, singulair, and azelastine for allergic  rhinitis. Refer ent as well. Slight irritation ear canal, drops have been prescribed, do not suspect this is cause of tinnitus.      Relevant Orders   Ambulatory referral to ENT    Meds ordered this encounter  Medications   ofloxacin (FLOXIN OTIC) 0.3 % OTIC solution    Sig: Place 10 drops into the right ear daily for 7 days.    Dispense:  5 mL    Refill:  0    Order Specific Question:   Supervising Provider    Answer:   BEDSOLE, AMY E [[id number]]    Follow-up: Return for as scheduled.    Eugenia Pancoast, FNP

## 2021-05-13 NOTE — Patient Instructions (Addendum)
An ear drop was prescribed to your preferred pharmacy today, please pick up and take as directed.  I suggest you speak with your allergist in regards to your flovent (ICS inhaler) as this could possibly be contributing to your ear humming.  A referral was placed today. Please let us know if you have not heard back within 1 week about your referral.  It was a pleasure seeing you today! Please do not hesitate to reach out with any questions and or concerns.  Regards,   Eugenia Pancoast FNP-C

## 2021-05-15 ENCOUNTER — Encounter: Payer: Self-pay | Admitting: Allergy

## 2021-05-15 ENCOUNTER — Other Ambulatory Visit: Payer: Self-pay

## 2021-05-15 ENCOUNTER — Ambulatory Visit: Payer: Medicare Other | Admitting: Allergy

## 2021-05-15 VITALS — BP 132/72 | HR 56 | Temp 97.9°F | Resp 16 | Ht 64.0 in | Wt 197.2 lb

## 2021-05-15 DIAGNOSIS — K2100 Gastro-esophageal reflux disease with esophagitis, without bleeding: Secondary | ICD-10-CM

## 2021-05-15 DIAGNOSIS — J4540 Moderate persistent asthma, uncomplicated: Secondary | ICD-10-CM | POA: Diagnosis not present

## 2021-05-15 DIAGNOSIS — J3089 Other allergic rhinitis: Secondary | ICD-10-CM | POA: Diagnosis not present

## 2021-05-15 DIAGNOSIS — J302 Other seasonal allergic rhinitis: Secondary | ICD-10-CM | POA: Diagnosis not present

## 2021-05-15 MED ORDER — LANSOPRAZOLE 15 MG PO CPDR: 15.0000 mg | DELAYED_RELEASE_CAPSULE | Freq: Two times a day (BID) | ORAL | 1 refills | Status: DC

## 2021-05-15 MED ORDER — FAMOTIDINE 20 MG PO TABS: 20.0000 mg | ORAL_TABLET | Freq: Two times a day (BID) | ORAL | 1 refills | Status: DC

## 2021-05-15 MED ORDER — AZELASTINE HCL 0.1 % NA SOLN
2.0000 | Freq: Two times a day (BID) | NASAL | 5 refills | Status: DC
Start: 2021-05-15 — End: 2021-06-03

## 2021-05-15 NOTE — Progress Notes (Signed)
Follow-up Note  RE: [name] MRN: [id number] DOB: Jul 08, 1950 Date of Office Visit: 05/15/2021   History of present illness: [name] is a 70 y.o. female presenting today for follow-up of asthma allergic rhinitis and reflux.  She was last seen in the office 02/13/21 by our nurse practitioner Ambs.   After the last visit it was recommended that she use Alvesco for her asthma control.  She states the Alvesco makes her mouth/throat dry and hoarse.  This she discussed this with her PCP and it was changed to Flovent.  This change occurred since having Covid she has had more issues with coughing.   She is taking flovent 2 puffs one day with a spacer device.   Still taking singulair daily.  Since being on Flovent she has not had any albuterol use.  She states the wheeze she was having is much improved with the Flovent.    She denies any significant allergy symptons. She continues allegra daily.  Has not needed to use azelastine or flonase as she denies any significant nasal congestion or drainage.   She is taking lansoprazole twice a day and pepcid 40mg  twice a day.  This regimen controls her reflux.  Review of systems: Review of Systems  Constitutional: Negative.   HENT: Negative.    Eyes: Negative.   Respiratory: Negative.    Cardiovascular: Negative.   Gastrointestinal: Negative.   Musculoskeletal: Negative.   Skin: Negative.   Allergic/Immunologic: Negative.   Neurological: Negative.     All other systems negative unless noted above in HPI  Past medical/social/surgical/family history have been reviewed and are unchanged unless specifically indicated below.  No changes  Medication List: Current Outpatient Medications  Medication Sig Dispense Refill   albuterol (VENTOLIN HFA) 108 (90 Base) MCG/ACT inhaler Inhale 2 puffs into the lungs every 6 (six) hours as needed for wheezing or shortness of breath. 8 g 2   atorvastatin (LIPITOR) 10 MG tablet Take 1 tablet (10 mg  total) by mouth daily. 90 tablet 3   dicyclomine (BENTYL) 10 MG capsule TAKE 1 CAPSULE(10 MG) BY MOUTH EVERY 8 HOURS 30 capsule 0   famotidine (PEPCID) 20 MG tablet Take 1 tablet (20 mg total) by mouth 2 (two) times daily. 180 tablet 1   fexofenadine (ALLERGY RELIEF) 180 MG tablet TAKE 1 TABLET(180 MG) BY MOUTH DAILY 30 tablet 4   fluticasone (FLOVENT HFA) 110 MCG/ACT inhaler Inhale 2 puffs into the lungs 2 (two) times daily. 1 each 5   imiquimod (ALDARA) 5 % cream Apply topically 3 (three) times a week. Apply to right vulva 3 times a week, use for 12 weeks 12 each 1   losartan-hydrochlorothiazide (HYZAAR) 100-25 MG tablet TAKE 1 TABLET BY MOUTH EVERY DAY 90 tablet 0   metoprolol tartrate (LOPRESSOR) 25 MG tablet TAKE 1/2 TABLET(12.5 MG) BY MOUTH TWICE DAILY 90 tablet 0   montelukast (SINGULAIR) 10 MG tablet TAKE 1 TABLET(10 MG) BY MOUTH EVERY MORNING 90 tablet 3   ofloxacin (FLOXIN OTIC) 0.3 % OTIC solution Place 10 drops into the right ear daily for 7 days. 5 mL 0   ALVESCO 80 MCG/ACT inhaler Inhale 2 puffs into the lungs 2 (two) times daily. (Patient not taking: Reported on 05/15/2021) 6.1 g 5   azelastine (ASTELIN) 0.1 % nasal spray Place 2 sprays into both nostrils 2 (two) times daily. 30 mL 5   lansoprazole (PREVACID) 15 MG capsule Take 1 capsule (15 mg total) by mouth 2 (two) times  daily before a meal. 180 capsule 1   No current facility-administered medications for this visit.     Known medication allergies: Allergies  Allergen Reactions   Ibuprofen Hypertension   Naprosyn [Naproxen] Nausea Only   Other     Allergic to toothpaste- makes mouth peel Hay fever/dust mites/trees "365 allergic"    Sevoflurane Nausea And Vomiting   Azithromycin Itching and Rash   Sulfa Antibiotics Hives and Rash     Physical examination: Blood pressure 132/72, pulse (!) 56, temperature 97.9 F (36.6 C), temperature source Temporal, resp. rate 16, height 5\' 4"  (1.626 m), weight 197 lb 3.2 oz (89.4  kg), SpO2 96 %.  General: Alert, interactive, in no acute distress. HEENT: PERRLA, TMs pearly gray, turbinates non-edematous without discharge, post-pharynx non erythematous. Neck: Supple without lymphadenopathy. Lungs: Clear to auscultation without wheezing, rhonchi or rales. {no increased work of breathing. CV: Normal S1, S2 without murmurs. Abdomen: Nondistended, nontender. Skin: Warm and dry, without lesions or rashes. Extremities:  No clubbing, cyanosis or edema. Neuro:   Grossly intact.  Diagnositics/Labs: None today  Assessment and plan:   Asthma Continue montelukast 10 mg once a day to prevent cough or wheeze Continue Flovent 150mcg 2 puffs once a day with a spacer  (take a slow inhalation  -- spacer should not make a noise if inhaling properly) During respiratory illness of flares increase Flovent to 2 puffs twice a day Continue albuterol 2 puffs once every 4 hours as needed for cough or wheeze You may use albuterol 2 puffs 5 to 15 minutes before activity to decrease cough or wheeze  Asthma control goals:  Full participation in all desired activities (may need albuterol before activity) Albuterol use two time or less a week on average (not counting use with activity) Cough interfering with sleep two time or less a month Oral steroids no more than once a year No hospitalizations   Allergic rhinitis Continue allergen avoidance measures directed toward pollens, cat, and mold as listed below Continue Allegra 180 mg once a day as needed for runny nose or itch Continue azelastine 2 sprays in each nostril up to twice a day as needed for runny nose Continue Flonase 2 sprays in each nostril once a day as needed for stuffy nose. In the right nostril, point the applicator out toward the right ear. In the left nostril, point the applicator out toward the left ear Consider saline nasal rinses as needed for nasal symptoms. Use this before any medicated nasal sprays for best  result  Reflux Continue dietary and lifestyle modifications as listed below Continue lansoprazole 15 mg twice a day and famotidine 20 mg twice a day  Call the clinic if this treatment plan is not working well for you.  Follow up in 6 months or sooner if needed.  I appreciate the opportunity to take part in [name]'s care. Please do not hesitate to contact me with questions.  Sincerely,   Prudy Feeler, MD Allergy/Immunology [facility]

## 2021-05-15 NOTE — Patient Instructions (Addendum)
Asthma Continue montelukast 10 mg once a day to prevent cough or wheeze Continue Flovent 138mcg 2 puffs once a day with a spacer  (take a slow inhalation  -- spacer should not make a noise if inhaling properly) During respiratory illness of flares increase Flovent to 2 puffs twice a day Continue albuterol 2 puffs once every 4 hours as needed for cough or wheeze You may use albuterol 2 puffs 5 to 15 minutes before activity to decrease cough or wheeze  Asthma control goals:  Full participation in all desired activities (may need albuterol before activity) Albuterol use two time or less a week on average (not counting use with activity) Cough interfering with sleep two time or less a month Oral steroids no more than once a year No hospitalizations   Allergic rhinitis Continue allergen avoidance measures directed toward pollens, cat, and mold as listed below Continue Allegra 180 mg once a day as needed for runny nose or itch Continue azelastine 2 sprays in each nostril up to twice a day as needed for runny nose Continue Flonase 2 sprays in each nostril once a day as needed for stuffy nose. In the right nostril, point the applicator out toward the right ear. In the left nostril, point the applicator out toward the left ear Consider saline nasal rinses as needed for nasal symptoms. Use this before any medicated nasal sprays for best result  Reflux Continue dietary and lifestyle modifications as listed below Continue lansoprazole 15 mg twice a day and famotidine 20 mg twice a day  Call the clinic if this treatment plan is not working well for you.  Follow up in 6 months or sooner if needed.

## 2021-05-18 ENCOUNTER — Telehealth: Payer: Self-pay | Admitting: *Deleted

## 2021-05-18 NOTE — Telephone Encounter (Signed)
PA has been submitted through CoverMyMeds for Lansoprazole and is currently pending approval/denial.

## 2021-05-18 NOTE — Telephone Encounter (Signed)
PA has been approved for Lansoprazole and has been sent to patients pharmacy electronically. PA is good for one year.

## 2021-05-23 ENCOUNTER — Other Ambulatory Visit: Payer: Self-pay | Admitting: Family Medicine

## 2021-05-27 DIAGNOSIS — M9902 Segmental and somatic dysfunction of thoracic region: Secondary | ICD-10-CM | POA: Diagnosis not present

## 2021-05-27 DIAGNOSIS — M546 Pain in thoracic spine: Secondary | ICD-10-CM | POA: Diagnosis not present

## 2021-05-27 DIAGNOSIS — M9901 Segmental and somatic dysfunction of cervical region: Secondary | ICD-10-CM | POA: Diagnosis not present

## 2021-05-27 DIAGNOSIS — M531 Cervicobrachial syndrome: Secondary | ICD-10-CM | POA: Diagnosis not present

## 2021-05-30 ENCOUNTER — Other Ambulatory Visit: Payer: Self-pay | Admitting: Family Medicine

## 2021-06-03 ENCOUNTER — Encounter: Payer: Self-pay | Admitting: Family

## 2021-06-03 ENCOUNTER — Telehealth (INDEPENDENT_AMBULATORY_CARE_PROVIDER_SITE_OTHER): Payer: Medicare Other | Admitting: Family

## 2021-06-03 VITALS — Ht 64.0 in | Wt 197.0 lb

## 2021-06-03 DIAGNOSIS — J209 Acute bronchitis, unspecified: Secondary | ICD-10-CM | POA: Diagnosis not present

## 2021-06-03 DIAGNOSIS — J45901 Unspecified asthma with (acute) exacerbation: Secondary | ICD-10-CM | POA: Insufficient documentation

## 2021-06-03 MED ORDER — METHYLPREDNISOLONE 4 MG PO TBPK: ORAL_TABLET | ORAL | 0 refills | Status: DC

## 2021-06-03 MED ORDER — AMOXICILLIN-POT CLAVULANATE 875-125 MG PO TABS: 1.0000 | ORAL_TABLET | Freq: Two times a day (BID) | ORAL | 0 refills | Status: DC

## 2021-06-03 NOTE — Progress Notes (Signed)
Virtual telephone visit    Virtual Visit via Telephone Note   This visit type was conducted due to national recommendations for restrictions regarding the COVID-19 Pandemic (e.g. social distancing) in an effort to limit this patient's exposure and mitigate transmission in our community. Due to her co-morbid illnesses, this patient is at least at moderate risk for complications without adequate follow up. This format is felt to be most appropriate for this patient at this time. The patient did not have access to video technology or had technical difficulties with video requiring transitioning to audio format only (telephone). Physical exam was limited to content and character of the telephone converstion. CMA was able to get the patient set up on a telephone visit.   Patient location: Home. Patient and provider in visit Provider location: Office  I discussed the limitations of evaluation and management by telemedicine and the availability of in person appointments. The patient expressed understanding and agreed to proceed.   Visit Date: 06/03/2021  Today's healthcare provider: Eugenia Pancoast, FNP     Subjective:    Patient ID: Allen Norris, female    DOB: [birth year], 70 y.o.   MRN: [id number]  Chief Complaint  Patient presents with   Nasal Congestion   Chest Pain   Cough   covid negative    Chest Pain  Associated symptoms include a cough (wet productive clear sputum), a fever (low grade 99.6) and sputum production (clear). Pertinent negatives include no shortness of breath.  Cough Associated symptoms include a fever (low grade 99.6) and wheezing. Pertinent negatives include no chest pain, chills, ear pain, sore throat or shortness of breath.   70 y/o female with acute concerns, sx have been for the past 7 days.  Had her great grand kids over over the weekend, who was diagnosed with bronchitis 12/20.  She states she started with low grade temp (99.6 F) and coughing which is  wet productive with clear sputum , now with some wheezing and feels a lot of chest congestion with mucous.denies sob.using her ventolin inhaler as she does have asthma so that is helping her. Also on a daily maintenance inhaler. Denies sinus pressure, ear pain and or sore throat. Mainly in her chest.  Covid tested, and she is negative.  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  02-17-2011   PULLEY RELEASE RIGHT THUMB  03-23-2011   SIMPLE VULVECTOMY  03-24-2010   right side   TONSILLECTOMY  1977   TRIGGER FINGER RELEASE     VAGINAL HYSTERECTOMY  1989   and Anterior and posterior repair's for prolapse partial   VULVECTOMY Right 01/10/2015   Procedure: RIGHT WIDE LOCAL EXCISION VULVECTOMY;  Surgeon: [name], MD;  Location: [facility];  Service: Gynecology;  Laterality:  Right;   VULVECTOMY Right 04/10/2015   Procedure: WIDE LOCAL EXCISION VULVA;  Surgeon: [name], MD;  Location: [facility];  Service: Gynecology;  Laterality: Right;   VULVECTOMY PARTIAL N/A 11/07/2018   Procedure: VULVECTOMY PARTIAL;  Surgeon: [name], MD;  Location: [facility];  Service: Gynecology;  Laterality: N/A;   WISDOM TOOTH EXTRACTION      Family History  Problem Relation Age of Onset   Cancer Maternal Grandmother    Hypertension Father    Heart disease Father     Asthma Father    Hypertension Mother    Heart disease Mother    Breast cancer Paternal Grandmother 82   Colon cancer Paternal Grandmother    Colon cancer Paternal Uncle    Allergic rhinitis Neg Hx    Angioedema Neg Hx    Eczema Neg Hx    Immunodeficiency Neg Hx    Urticaria Neg Hx    Esophageal cancer Neg Hx    Rectal cancer Neg Hx    Stomach cancer Neg Hx     Social History   Socioeconomic History   Marital status: Married    Spouse name: Not on file   Number of children: 3   Years of education: Not on file   Highest education level: Not on file  Occupational History   Occupation: retired  Tobacco Use   Smoking status: Former    Years: 5.00    Types: Cigarettes    Quit date: 04/15/1989    Years since quitting: 32.1   Smokeless tobacco: Never  Vaping Use   Vaping Use: Never used  Substance and Sexual Activity   Alcohol use: Yes    Alcohol/week: 1.0 standard drink    Types: 1 Glasses of wine per week    Comment: occasional wine   Drug use: No   Sexual activity: Not on file  Other Topics Concern   Not on file  Social History Narrative   She is married.  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   Occasional wine, former smoker not now no substances   3 kids plus two step daughters   Social Determinants of Radio broadcast assistant Strain: Low Risk    Difficulty of Paying Living Expenses: Not hard at all  Food Insecurity: No Food Insecurity   Worried About Charity fundraiser in the Last Year: Never true   Arboriculturist in the Last Year: Never true  Transportation Needs: No Transportation Needs   Lack of Transportation (Medical): No   Lack of Transportation (Non-Medical): No  Physical Activity: Inactive   Days of Exercise per Week: 0 days   Minutes of Exercise per Session: 0 min  Stress: No Stress Concern Present   Feeling of Stress : Not at all  Social Connections: Not on file  Intimate Partner Violence: Not At Risk   Fear of Current or Ex-Partner: No    Emotionally Abused: No   Physically Abused: No   Sexually Abused: No    Outpatient Medications Prior to Visit  Medication Sig Dispense Refill   albuterol (VENTOLIN HFA) 108 (90 Base) MCG/ACT inhaler Inhale 2 puffs into the lungs every 6 (six) hours as needed for wheezing or shortness of breath. 8 g 2   atorvastatin (LIPITOR) 10 MG tablet Take 1 tablet (10 mg total) by mouth daily. 90 tablet 3   dicyclomine (BENTYL) 10 MG capsule TAKE 1 CAPSULE(10 MG) BY MOUTH EVERY 8 HOURS 30 capsule 0  famotidine (PEPCID) 20 MG tablet Take 1 tablet (20 mg total) by mouth 2 (two) times daily. 180 tablet 1   fexofenadine (ALLERGY RELIEF) 180 MG tablet TAKE 1 TABLET(180 MG) BY MOUTH DAILY 30 tablet 4   fluticasone (FLOVENT HFA) 110 MCG/ACT inhaler Inhale 2 puffs into the lungs 2 (two) times daily. 1 each 5   imiquimod (ALDARA) 5 % cream Apply topically 3 (three) times a week. Apply to right vulva 3 times a week, use for 12 weeks 12 each 1   lansoprazole (PREVACID) 15 MG capsule Take 1 capsule (15 mg total) by mouth 2 (two) times daily before a meal. 180 capsule 1   losartan-hydrochlorothiazide (HYZAAR) 100-25 MG tablet TAKE 1 TABLET BY MOUTH EVERY DAY 90 tablet 0   metoprolol tartrate (LOPRESSOR) 25 MG tablet TAKE 1/2 TABLET(12.5 MG) BY MOUTH TWICE DAILY 90 tablet 0   montelukast (SINGULAIR) 10 MG tablet TAKE 1 TABLET(10 MG) BY MOUTH EVERY MORNING 90 tablet 3   azelastine (ASTELIN) 0.1 % nasal spray Place 2 sprays into both nostrils 2 (two) times daily. 30 mL 5   ALVESCO 80 MCG/ACT inhaler Inhale 2 puffs into the lungs 2 (two) times daily. (Patient not taking: Reported on 06/03/2021) 6.1 g 5   No facility-administered medications prior to visit.    Allergies  Allergen Reactions   Ibuprofen Hypertension   Naprosyn [Naproxen] Nausea Only   Other     Allergic to toothpaste- makes mouth peel Hay fever/dust mites/trees "365 allergic"    Sevoflurane Nausea And Vomiting   Azithromycin Itching and Rash    Sulfa Antibiotics Hives and Rash    Review of Systems  Constitutional:  Positive for fever (low grade 99.6). Negative for chills.  HENT:  Positive for congestion. Negative for ear pain and sore throat.   Respiratory:  Positive for cough (wet productive clear sputum), sputum production (clear) and wheezing. Negative for shortness of breath.   Cardiovascular:  Negative for chest pain.  All other systems reviewed and are negative.     Objective:    Physical Exam Neurological:     General: No focal deficit present.     Mental Status: She is alert.  Psychiatric:        Mood and Affect: Mood normal.        Behavior: Behavior normal.    Ht 5\' 4"  (1.626 m)    Wt 197 lb (89.4 kg)    BMI 33.81 kg/m  Wt Readings from Last 3 Encounters:  06/03/21 197 lb (89.4 kg)  05/15/21 197 lb 3.2 oz (89.4 kg)  05/13/21 196 lb (88.9 kg)        Assessment & Plan:   Problem List Items Addressed This Visit       Respiratory   Acute bronchitis with asthma with acute exacerbation - Primary    Continue ventolin prn, cont flovent Daily. rx given for medrol dose pack and augmetnin 875/125 mg po bid x 10 days.  Take antibiotic as prescribed. Increase oral fluids. Pt to f/u if sx worsen and or fail to improve in 2-3 days. If sudden/worsening sob go to er .      Relevant Medications   amoxicillin-clavulanate (AUGMENTIN) 875-125 MG tablet   methylPREDNISolone (MEDROL DOSEPAK) 4 MG TBPK tablet    I have discontinued Videl Nobrega. Thole's Alvesco and azelastine. I am also having her start on amoxicillin-clavulanate and methylPREDNISolone. Additionally, I am having her maintain her imiquimod, montelukast, atorvastatin, dicyclomine, losartan-hydrochlorothiazide, metoprolol tartrate, fexofenadine, albuterol, fluticasone, lansoprazole, and  famotidine.  Meds ordered this encounter  Medications   amoxicillin-clavulanate (AUGMENTIN) 875-125 MG tablet    Sig: Take 1 tablet by mouth 2 (two) times daily.     Dispense:  20 tablet    Refill:  0    Order Specific Question:   Supervising Provider    Answer:   BEDSOLE, AMY E [[id number]]   methylPREDNISolone (MEDROL DOSEPAK) 4 MG TBPK tablet    Sig: Take per package instructions    Dispense:  21 tablet    Refill:  0    Order Specific Question:   Supervising Provider    Answer:   BEDSOLE, AMY E [[id number]]     I discussed the assessment and treatment plan with the patient. The patient was provided an opportunity to ask questions and all were answered. The patient agreed with the plan and demonstrated an understanding of the instructions.   The patient was advised to call back or seek an in-person evaluation if the symptoms worsen or if the condition fails to improve as anticipated.  I provided 19 minutes of non-face-to-face time during this encounter.   Eugenia Pancoast, [place] at [place] [phone] (phone) [phone] (fax)  [place]

## 2021-06-03 NOTE — Assessment & Plan Note (Signed)
Continue ventolin prn, cont flovent Daily. rx given for medrol dose pack and augmetnin 875/125 mg po bid x 10 days.  Take antibiotic as prescribed. Increase oral fluids. Pt to f/u if sx worsen and or fail to improve in 2-3 days. If sudden/worsening sob go to er .

## 2021-06-03 NOTE — Patient Instructions (Signed)
Antibiotic sent to preferred pharmacy.   Please increase oral fluids, steamy hot shower/humidifier prn.  Please follow up if no improvement in 2-3 days.   It was a pleasure seeing you today! Please do not hesitate to reach out with any questions and or concerns.  Regards,   Jasiya Markie   

## 2021-06-17 DIAGNOSIS — M955 Acquired deformity of pelvis: Secondary | ICD-10-CM | POA: Diagnosis not present

## 2021-06-17 DIAGNOSIS — M9905 Segmental and somatic dysfunction of pelvic region: Secondary | ICD-10-CM | POA: Diagnosis not present

## 2021-06-17 DIAGNOSIS — M5442 Lumbago with sciatica, left side: Secondary | ICD-10-CM | POA: Diagnosis not present

## 2021-06-17 DIAGNOSIS — M9903 Segmental and somatic dysfunction of lumbar region: Secondary | ICD-10-CM | POA: Diagnosis not present

## 2021-06-18 NOTE — Progress Notes (Signed)
Subjective:   [name] is a 71 y.o. female who presents for Medicare Annual (Subsequent) preventive examination.  I connected with [name] today by telephone and verified that I am speaking with the correct person using two identifiers. Location patient: home Location provider: work Persons participating in the virtual visit: patient, Marine scientist.    I discussed the limitations, risks, security and privacy concerns of performing an evaluation and management service by telephone and the availability of in person appointments. I also discussed with the patient that there may be a patient responsible charge related to this service. The patient expressed understanding and verbally consented to this telephonic visit.    Interactive audio and video telecommunications were attempted between this provider and patient, however failed, due to patient having technical difficulties OR patient did not have access to video capability.  We continued and completed visit with audio only.  Some vital signs may be absent or patient reported.   Time Spent with patient on telephone encounter: 25 minutes  Review of Systems     Cardiac Risk Factors include: advanced age (>17men, >58 women);hypertension     Objective:    Today's Vitals   06/19/21 1028  Weight: 191 lb (86.6 kg)  Height: 5\' 4"  (1.626 m)   Body mass index is 32.79 kg/m.  Advanced Directives 06/19/2021 08/08/2020 06/18/2020 07/23/2019 03/20/2019 12/13/2018 11/07/2018  Does Patient Have a Medical Advance Directive? No No No No No No No  Would patient like information on creating a medical advance directive? Yes (MAU/Ambulatory/Procedural Areas - Information given) No - Patient declined No - Patient declined No - Patient declined No - Patient declined No - Patient declined No - Guardian declined    Current Medications (verified) Outpatient Encounter Medications as of 06/19/2021  Medication Sig   albuterol (VENTOLIN HFA) 108 (90 Base) MCG/ACT  inhaler Inhale 2 puffs into the lungs every 6 (six) hours as needed for wheezing or shortness of breath.   amoxicillin-clavulanate (AUGMENTIN) 875-125 MG tablet Take 1 tablet by mouth 2 (two) times daily.   atorvastatin (LIPITOR) 10 MG tablet Take 1 tablet (10 mg total) by mouth daily.   dicyclomine (BENTYL) 10 MG capsule TAKE 1 CAPSULE(10 MG) BY MOUTH EVERY 8 HOURS   famotidine (PEPCID) 20 MG tablet Take 1 tablet (20 mg total) by mouth 2 (two) times daily.   fexofenadine (ALLERGY RELIEF) 180 MG tablet TAKE 1 TABLET(180 MG) BY MOUTH DAILY   fluticasone (FLOVENT HFA) 110 MCG/ACT inhaler Inhale 2 puffs into the lungs 2 (two) times daily.   imiquimod (ALDARA) 5 % cream Apply topically 3 (three) times a week. Apply to right vulva 3 times a week, use for 12 weeks   lansoprazole (PREVACID) 15 MG capsule Take 1 capsule (15 mg total) by mouth 2 (two) times daily before a meal.   losartan-hydrochlorothiazide (HYZAAR) 100-25 MG tablet TAKE 1 TABLET BY MOUTH EVERY DAY   methylPREDNISolone (MEDROL DOSEPAK) 4 MG TBPK tablet Take per package instructions   metoprolol tartrate (LOPRESSOR) 25 MG tablet TAKE 1/2 TABLET(12.5 MG) BY MOUTH TWICE DAILY   montelukast (SINGULAIR) 10 MG tablet TAKE 1 TABLET(10 MG) BY MOUTH EVERY MORNING   [DISCONTINUED] montelukast (SINGULAIR) 10 MG tablet TAKE 1 TABLET(10 MG) BY MOUTH EVERY MORNING   No facility-administered encounter medications on file as of 06/19/2021.    Allergies (verified) Ibuprofen, Naprosyn [naproxen], Other, Sevoflurane, Azithromycin, and Sulfa antibiotics   History: Past Medical History:  Diagnosis Date   Allergic rhinitis    Arthritis  Asthma    mild no inaler use   GERD (gastroesophageal reflux disease)    Headache(784.0)    Migraines   History of adenomatous polyp of colon    History of concussion    AS CHILD--  NO RESIDUAL   History of palpitations    Hypertension    Paget's disease of vulva (HCC)    PONV (postoperative nausea and  vomiting)    SEVERE   Wears glasses    Past Surgical History:  Procedure Laterality Date   mucoid cyst removal thumb     PULLERY RELEASE LEFT THUMB, LEFT RING FINGER/  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  02-17-2011   PULLEY RELEASE RIGHT THUMB  03-23-2011   SIMPLE VULVECTOMY  03-24-2010   right side   TONSILLECTOMY  1977   TRIGGER FINGER RELEASE     VAGINAL HYSTERECTOMY  1989   and Anterior and posterior repair's for prolapse partial   VULVECTOMY Right 01/10/2015   Procedure: RIGHT WIDE LOCAL EXCISION VULVECTOMY;  Surgeon: [name], MD;  Location: [facility];  Service: Gynecology;  Laterality: Right;   VULVECTOMY Right 04/10/2015   Procedure: WIDE LOCAL EXCISION VULVA;  Surgeon: [name], MD;  Location: [facility];  Service: Gynecology;  Laterality: Right;   VULVECTOMY PARTIAL N/A 11/07/2018   Procedure: VULVECTOMY PARTIAL;  Surgeon: [name], MD;  Location: [facility];  Service: Gynecology;  Laterality: N/A;   WISDOM TOOTH EXTRACTION     Family History  Problem Relation Age of Onset   Cancer Maternal Grandmother    Hypertension Father    Heart disease Father    Asthma Father    Hypertension Mother    Heart disease Mother    Breast cancer Paternal Grandmother 77   Colon cancer Paternal Grandmother    Colon cancer Paternal Uncle    Allergic rhinitis Neg Hx    Angioedema Neg Hx    Eczema Neg Hx    Immunodeficiency Neg Hx    Urticaria Neg Hx    Esophageal cancer Neg Hx    Rectal cancer Neg Hx    Stomach cancer Neg Hx    Social History   Socioeconomic History   Marital status: Married    Spouse name: Not on file   Number of children: 3   Years of education: Not on file   Highest education level: Not on file  Occupational History   Occupation: retired  Tobacco Use   Smoking status: Former    Years: 5.00    Types: Cigarettes    Quit date: 04/15/1989    Years since  quitting: 32.2   Smokeless tobacco: Never  Vaping Use   Vaping Use: Never used  Substance and Sexual Activity   Alcohol use: Not Currently    Alcohol/week: 1.0 standard drink    Types: 1 Glasses of wine per week    Comment: occasional wine   Drug use: No   Sexual activity: Not on file  Other Topics Concern   Not on file  Social History Narrative   She is married.  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   Occasional wine, former smoker not now no substances   3 kids plus two step daughters   Social Determinants of Health   Financial Resource Strain: Low Risk    Difficulty of Paying Living Expenses: Not hard at all  Food Insecurity: No Food Insecurity   Worried About Charity fundraiser in the Last  Year: Never true   [place] in the Last Year: Never true  Transportation Needs: No Transportation Needs   Lack of Transportation (Medical): No   Lack of Transportation (Non-Medical): No  Physical Activity: Inactive   Days of Exercise per Week: 0 days   Minutes of Exercise per Session: 0 min  Stress: No Stress Concern Present   Feeling of Stress : Not at all  Social Connections: Socially Integrated   Frequency of Communication with Friends and Family: Twice a week   Frequency of Social Gatherings with Friends and Family: Once a week   Attends Religious Services: More than 4 times per year   Active Member of [employer] or Organizations: Yes   Attends Music therapist: More than 4 times per year   Marital Status: Married    Tobacco Counseling Counseling given: Not Answered   Clinical Intake:  Pre-visit preparation completed: Yes  Pain : No/denies pain     BMI - recorded: 32.79 Nutritional Status: BMI > 30  Obese Nutritional Risks: None Diabetes: No  How often do you need to have someone help you when you read instructions, pamphlets, or other written materials from your doctor or pharmacy?: 1 - Never  Diabetic? No  Interpreter Needed?:  No  Information entered by :: Orrin Brigham LPN   Activities of Daily Living In your present state of health, do you have any difficulty performing the following activities: 06/19/2021  Hearing? N  Vision? Y  Difficulty concentrating or making decisions? N  Walking or climbing stairs? Y  Dressing or bathing? N  Doing errands, shopping? N  Preparing Food and eating ? N  Using the Toilet? N  In the past six months, have you accidently leaked urine? N  Do you have problems with loss of bowel control? N  Managing your Medications? N  Managing your Finances? N  Housekeeping or managing your Housekeeping? N  Some recent data might be hidden    Patient Care Team: Jinny Sanders, MD as PCP - General  Indicate any recent Medical Services you may have received from other than [facility] providers in the past year (date may be approximate).     Assessment:   This is a routine wellness examination for [name].  Hearing/Vision screen Hearing Screening - Comments:: No issues Vision Screening - Comments:: Last exam 01/2021, Dr. Glennon Mac , wears glasses  Dietary issues and exercise activities discussed: Current Exercise Habits: The patient does not participate in regular exercise at present   Goals Addressed             This Visit's Progress    Patient Stated       Would like to get outside and walk more       Depression Screen PHQ 2/9 Scores 06/19/2021 06/18/2020 03/20/2019 09/06/2017  PHQ - 2 Score 0 0 0 0  PHQ- 9 Score - 0 0 -    Fall Risk Fall Risk  06/19/2021 06/18/2020 03/20/2019  Falls in the past year? 0 0 0  Number falls in past yr: 0 0 -  Injury with Fall? 0 0 -  Risk for fall due to : - No Fall Risks Medication side effect  Follow up Falls prevention discussed Falls evaluation completed;Falls prevention discussed Falls evaluation completed;Falls prevention discussed    FALL RISK PREVENTION PERTAINING TO THE HOME:  Any stairs in or around the home? Yes  If so, are there  any without handrails? Yes  Home free of loose  throw rugs in walkways, pet beds, electrical cords, etc? No  Adequate lighting in your home to reduce risk of falls? Yes   ASSISTIVE DEVICES UTILIZED TO PREVENT FALLS:  Life alert? No  Use of a cane, walker or w/c? No  Grab bars in the bathroom? No  Shower chair or bench in shower? Yes  Elevated toilet seat or a handicapped toilet? No   TIMED UP AND GO:  Was the test performed? No .    Cognitive Function: Normal cognitive status assessed by this Nurse Health Advisor. No abnormalities found.   MMSE - Mini Mental State Exam 06/18/2020 03/20/2019  Orientation to time 5 5  Orientation to Place 5 5  Registration 3 3  Attention/ Calculation 5 5  Recall 3 3  Language- repeat 1 1        Immunizations Immunization History  Administered Date(s) Administered   Fluad Quad(high Dose 65+) 04/17/2019, 06/27/2020   Influenza,inj,Quad PF,6+ Mos 03/13/2015, 04/09/2016, 06/17/2017, 04/07/2018   Pneumococcal Conjugate-13 04/17/2019   Pneumococcal Polysaccharide-23 06/27/2020   Td 12/06/2006   Tdap 05/02/2013   Zoster, Live 04/09/2016    TDAP status: Up to date  Flu Vaccine status: Up to date  Pneumococcal vaccine status: Up to date  Covid-19 vaccine status: Information provided on how to obtain vaccines.   Qualifies for Shingles Vaccine? Yes   Zostavax completed Yes   Shingrix Completed?: No.    Education has been provided regarding the importance of this vaccine. Patient has been advised to call insurance company to determine out of pocket expense if they have not yet received this vaccine. Advised may also receive vaccine at local pharmacy or Health Dept. Verbalized acceptance and understanding.  Screening Tests Health Maintenance  Topic Date Due   Zoster Vaccines- Shingrix (1 of 2) Never done   MAMMOGRAM  07/30/2020   INFLUENZA VACCINE  01/05/2021   COVID-19 Vaccine (1) 07/04/2021 (Originally 07/15/1951)   TETANUS/TDAP   05/03/2023   COLONOSCOPY (Pts 45-48yrs Insurance coverage will need to be confirmed)  07/30/2025   Pneumonia Vaccine 60+ Years old  Completed   DEXA SCAN  Completed   Hepatitis C Screening  Completed   HPV VACCINES  Aged Out    Health Maintenance  Health Maintenance Due  Topic Date Due   Zoster Vaccines- Shingrix (1 of 2) Never done   MAMMOGRAM  07/30/2020   INFLUENZA VACCINE  01/05/2021    Colorectal cancer screening: Type of screening: Colonoscopy. Completed 07/31/15. Repeat every 10 years  Mammogram status: Ordered 06/19/21. Pt provided with contact info and advised to call to schedule appt.   Bone Density status: Completed 07/31/19. Results reflect: Bone density results: OSTEOPENIA. Repeat every 2 years.  Lung Cancer Screening: (Low Dose CT Chest recommended if Age 26-80 years, 30 pack-year currently smoking OR have quit w/in 15years.) does not qualify.     Additional Screening:  Hepatitis C Screening: does qualify; Completed 04/09/16  Vision Screening: Recommended annual ophthalmology exams for early detection of glaucoma and other disorders of the eye. Is the patient up to date with their annual eye exam?  Yes  Who is the provider or what is the name of the office in which the patient attends annual eye exams? Dr. Glennon Mac   Dental Screening: Recommended annual dental exams for proper oral hygiene  Community Resource Referral / Chronic Care Management: CRR required this visit?  No   CCM required this visit?  No      Plan:     I  have personally reviewed and noted the following in the patients chart:   Medical and social history Use of alcohol, tobacco or illicit drugs  Current medications and supplements including opioid prescriptions.  Functional ability and status Nutritional status Physical activity Advanced directives List of other physicians Hospitalizations, surgeries, and ER visits in previous 12 months Vitals Screenings to include cognitive,  depression, and falls Referrals and appointments  In addition, I have reviewed and discussed with patient certain preventive protocols, quality metrics, and best practice recommendations. A written personalized care plan for preventive services as well as general preventive health recommendations were provided to patient.   Due to this being a telephonic visit, the after visit summary with patients personalized plan was offered to patient via mail or my-chart. Patient would like to access on my-chart.   Loma Messing, LPN   7/74/1423   Nurse Health Advisor  Nurse Notes: none

## 2021-06-19 ENCOUNTER — Ambulatory Visit (INDEPENDENT_AMBULATORY_CARE_PROVIDER_SITE_OTHER): Payer: Medicare Other

## 2021-06-19 ENCOUNTER — Telehealth: Payer: Self-pay | Admitting: Family Medicine

## 2021-06-19 VITALS — Ht 64.0 in | Wt 191.0 lb

## 2021-06-19 DIAGNOSIS — Z0000 Encounter for general adult medical examination without abnormal findings: Secondary | ICD-10-CM | POA: Diagnosis not present

## 2021-06-19 DIAGNOSIS — Z780 Asymptomatic menopausal state: Secondary | ICD-10-CM | POA: Diagnosis not present

## 2021-06-19 DIAGNOSIS — Z1231 Encounter for screening mammogram for malignant neoplasm of breast: Secondary | ICD-10-CM

## 2021-06-19 NOTE — Patient Instructions (Signed)
[name] , Thank you for taking time to complete your Medicare Wellness Visit. I appreciate your ongoing commitment to your health goals. Please review the following plan we discussed and let me know if I can assist you in the future.   Screening recommendations/referrals: Colonoscopy: up to date, completed 07/31/15, due 07/30/25 Mammogram: due, last completed 07/31/19, ordered today someone will call to schedule an appointment Bone Density: due, last completed 07/31/19, ordered today someone will call to schedule an appointment Recommended yearly ophthalmology/optometry visit for glaucoma screening and checkup Recommended yearly dental visit for hygiene and checkup  Vaccinations: Influenza vaccine: up to date Pneumococcal vaccine: up to date Tdap vaccine: up to date, completed 05/02/13, due 05/03/23 Shingles vaccine: Discuss with pharmacy   Covid-19:newest booster available at your local pharmacy  Advanced directives: information available at your next appointment  Conditions/risks identified: see problem list  Next appointment: Follow up in one year for your annual wellness visit 06/18/22 @ 1:15pm, this will be a telephone visit   Preventive Care 69 Years and Older, Female Preventive care refers to lifestyle choices and visits with your health care provider that can promote health and wellness. What does preventive care include? A yearly physical exam. This is also called an annual well check. Dental exams once or twice a year. Routine eye exams. Ask your health care provider how often you should have your eyes checked. Personal lifestyle choices, including: Daily care of your teeth and gums. Regular physical activity. Eating a healthy diet. Avoiding tobacco and drug use. Limiting alcohol use. Practicing safe sex. Taking low-dose aspirin every day. Taking vitamin and mineral supplements as recommended by your health care provider. What happens during an annual well check? The  services and screenings done by your health care provider during your annual well check will depend on your age, overall health, lifestyle risk factors, and family history of disease. Counseling  Your health care provider may ask you questions about your: Alcohol use. Tobacco use. Drug use. Emotional well-being. Home and relationship well-being. Sexual activity. Eating habits. History of falls. Memory and ability to understand (cognition). Work and work Statistician. Reproductive health. Screening  You may have the following tests or measurements: Height, weight, and BMI. Blood pressure. Lipid and cholesterol levels. These may be checked every 5 years, or more frequently if you are over 22 years old. Skin check. Lung cancer screening. You may have this screening every year starting at age 69 if you have a 30-pack-year history of smoking and currently smoke or have quit within the past 15 years. Fecal occult blood test (FOBT) of the stool. You may have this test every year starting at age 69 Flexible sigmoidoscopy or colonoscopy. You may have a sigmoidoscopy every 5 years or a colonoscopy every 10 years starting at age 19. Hepatitis C blood test. Hepatitis B blood test. Sexually transmitted disease (STD) testing. Diabetes screening. This is done by checking your blood sugar (glucose) after you have not eaten for a while (fasting). You may have this done every 1-3 years. Bone density scan. This is done to screen for osteoporosis. You may have this done starting at age 69 Mammogram. This may be done every 1-2 years. Talk to your health care provider about how often you should have regular mammograms. Talk with your health care provider about your test results, treatment options, and if necessary, the need for more tests. Vaccines  Your health care provider may recommend certain vaccines, such as: Influenza vaccine. This is recommended every  year. Tetanus, diphtheria, and acellular  pertussis (Tdap, Td) vaccine. You may need a Td booster every 10 years. Zoster vaccine. You may need this after age 69 Pneumococcal 13-valent conjugate (PCV13) vaccine. One dose is recommended after age 69 Pneumococcal polysaccharide (PPSV23) vaccine. One dose is recommended after age 69 Talk to your health care provider about which screenings and vaccines you need and how often you need them. This information is not intended to replace advice given to you by your health care provider. Make sure you discuss any questions you have with your health care provider. Document Released: 06/20/2015 Document Revised: 02/11/2016 Document Reviewed: 03/25/2015 Elsevier Interactive Patient Education  2017 [place] Prevention in the Home Falls can cause injuries. They can happen to people of all ages. There are many things you can do to make your home safe and to help prevent falls. What can I do on the outside of my home? Regularly fix the edges of walkways and driveways and fix any cracks. Remove anything that might make you trip as you walk through a door, such as a raised step or threshold. Trim any bushes or trees on the path to your home. Use bright outdoor lighting. Clear any walking paths of anything that might make someone trip, such as rocks or tools. Regularly check to see if handrails are loose or broken. Make sure that both sides of any steps have handrails. Any raised decks and porches should have guardrails on the edges. Have any leaves, snow, or ice cleared regularly. Use sand or salt on walking paths during winter. Clean up any spills in your garage right away. This includes oil or grease spills. What can I do in the bathroom? Use night lights. Install grab bars by the toilet and in the tub and shower. Do not use towel bars as grab bars. Use non-skid mats or decals in the tub or shower. If you need to sit down in the shower, use a plastic, non-slip stool. Keep the floor  dry. Clean up any water that spills on the floor as soon as it happens. Remove soap buildup in the tub or shower regularly. Attach bath mats securely with double-sided non-slip rug tape. Do not have throw rugs and other things on the floor that can make you trip. What can I do in the bedroom? Use night lights. Make sure that you have a light by your bed that is easy to reach. Do not use any sheets or blankets that are too big for your bed. They should not hang down onto the floor. Have a firm chair that has side arms. You can use this for support while you get dressed. Do not have throw rugs and other things on the floor that can make you trip. What can I do in the kitchen? Clean up any spills right away. Avoid walking on wet floors. Keep items that you use a lot in easy-to-reach places. If you need to reach something above you, use a strong step stool that has a grab bar. Keep electrical cords out of the way. Do not use floor polish or wax that makes floors slippery. If you must use wax, use non-skid floor wax. Do not have throw rugs and other things on the floor that can make you trip. What can I do with my stairs? Do not leave any items on the stairs. Make sure that there are handrails on both sides of the stairs and use them. Fix handrails that are broken or  loose. Make sure that handrails are as long as the stairways. Check any carpeting to make sure that it is firmly attached to the stairs. Fix any carpet that is loose or worn. Avoid having throw rugs at the top or bottom of the stairs. If you do have throw rugs, attach them to the floor with carpet tape. Make sure that you have a light switch at the top of the stairs and the bottom of the stairs. If you do not have them, ask someone to add them for you. What else can I do to help prevent falls? Wear shoes that: Do not have high heels. Have rubber bottoms. Are comfortable and fit you well. Are closed at the toe. Do not wear  sandals. If you use a stepladder: Make sure that it is fully opened. Do not climb a closed stepladder. Make sure that both sides of the stepladder are locked into place. Ask someone to hold it for you, if possible. Clearly mark and make sure that you can see: Any grab bars or handrails. First and last steps. Where the edge of each step is. Use tools that help you move around (mobility aids) if they are needed. These include: Canes. Walkers. Scooters. Crutches. Turn on the lights when you go into a dark area. Replace any light bulbs as soon as they burn out. Set up your furniture so you have a clear path. Avoid moving your furniture around. If any of your floors are uneven, fix them. If there are any pets around you, be aware of where they are. Review your medicines with your doctor. Some medicines can make you feel dizzy. This can increase your chance of falling. Ask your doctor what other things that you can do to help prevent falls. This information is not intended to replace advice given to you by your health care provider. Make sure you discuss any questions you have with your health care provider. Document Released: 03/20/2009 Document Revised: 10/30/2015 Document Reviewed: 06/28/2014 Elsevier Interactive Patient Education  2017 [employer].

## 2021-06-19 NOTE — Telephone Encounter (Signed)
Please schedule CPE with fasting labs prior with Dr. Diona Browner.  She already has Medicare Wellness visit with nurse scheduled for today.

## 2021-06-19 NOTE — Telephone Encounter (Signed)
Called pt to scheduled CPE, lvmtcb

## 2021-06-22 NOTE — Telephone Encounter (Signed)
2nd attempt  LMTCB to schedule a CPE and labs

## 2021-06-24 NOTE — Telephone Encounter (Signed)
[place] Night - Client Nonclinical Telephone Record  [facility] Client [facility] Night - Client Client Site [place] - Night Provider Eliezer Lofts - MD Contact Type Call Who Is Calling Patient / Member / Family / Caregiver Caller Name Gwendolin Briel Caller Phone Number [phone] Patient Name [name] Patient DOB 06-03-1951 Call Type Message Only Information Provided Reason for Call Request to Schedule Office Appointment Initial Comment Caller States she needs to make a appointment. Patient request to speak to RN No Disp. Time Disposition Final User 06/23/2021 5:03:57 PM General Information Provided Yes Jolaine Artist Call Closed By: Jolaine Artist Transaction Date/Time: 06/23/2021 5:00:47 PM (ET

## 2021-07-08 DIAGNOSIS — M5442 Lumbago with sciatica, left side: Secondary | ICD-10-CM | POA: Diagnosis not present

## 2021-07-08 DIAGNOSIS — M955 Acquired deformity of pelvis: Secondary | ICD-10-CM | POA: Diagnosis not present

## 2021-07-08 DIAGNOSIS — M9905 Segmental and somatic dysfunction of pelvic region: Secondary | ICD-10-CM | POA: Diagnosis not present

## 2021-07-08 DIAGNOSIS — M9903 Segmental and somatic dysfunction of lumbar region: Secondary | ICD-10-CM | POA: Diagnosis not present

## 2021-07-23 ENCOUNTER — Other Ambulatory Visit: Payer: Self-pay | Admitting: Internal Medicine

## 2021-07-27 ENCOUNTER — Other Ambulatory Visit: Payer: Self-pay | Admitting: Family Medicine

## 2021-07-28 NOTE — Telephone Encounter (Signed)
Please schedule 40 minute CPE with fasting labs prior with Dr. Diona Browner.  Already had [place] with nurse.

## 2021-07-29 DIAGNOSIS — M955 Acquired deformity of pelvis: Secondary | ICD-10-CM | POA: Diagnosis not present

## 2021-07-29 DIAGNOSIS — M9905 Segmental and somatic dysfunction of pelvic region: Secondary | ICD-10-CM | POA: Diagnosis not present

## 2021-07-29 DIAGNOSIS — M5442 Lumbago with sciatica, left side: Secondary | ICD-10-CM | POA: Diagnosis not present

## 2021-07-29 DIAGNOSIS — M9903 Segmental and somatic dysfunction of lumbar region: Secondary | ICD-10-CM | POA: Diagnosis not present

## 2021-08-17 DIAGNOSIS — M5442 Lumbago with sciatica, left side: Secondary | ICD-10-CM | POA: Diagnosis not present

## 2021-08-17 DIAGNOSIS — M955 Acquired deformity of pelvis: Secondary | ICD-10-CM | POA: Diagnosis not present

## 2021-08-17 DIAGNOSIS — M9903 Segmental and somatic dysfunction of lumbar region: Secondary | ICD-10-CM | POA: Diagnosis not present

## 2021-08-17 DIAGNOSIS — M9905 Segmental and somatic dysfunction of pelvic region: Secondary | ICD-10-CM | POA: Diagnosis not present

## 2021-08-27 DIAGNOSIS — J069 Acute upper respiratory infection, unspecified: Secondary | ICD-10-CM | POA: Diagnosis not present

## 2021-09-02 DIAGNOSIS — M9905 Segmental and somatic dysfunction of pelvic region: Secondary | ICD-10-CM | POA: Diagnosis not present

## 2021-09-02 DIAGNOSIS — M955 Acquired deformity of pelvis: Secondary | ICD-10-CM | POA: Diagnosis not present

## 2021-09-02 DIAGNOSIS — M5442 Lumbago with sciatica, left side: Secondary | ICD-10-CM | POA: Diagnosis not present

## 2021-09-02 DIAGNOSIS — M9903 Segmental and somatic dysfunction of lumbar region: Secondary | ICD-10-CM | POA: Diagnosis not present

## 2021-09-11 NOTE — Telephone Encounter (Signed)
Left message to call office to set up CPE and fasting labs. ?

## 2021-09-14 DIAGNOSIS — H903 Sensorineural hearing loss, bilateral: Secondary | ICD-10-CM | POA: Diagnosis not present

## 2021-09-14 DIAGNOSIS — H6981 Other specified disorders of Eustachian tube, right ear: Secondary | ICD-10-CM | POA: Diagnosis not present

## 2021-09-14 DIAGNOSIS — H93A1 Pulsatile tinnitus, right ear: Secondary | ICD-10-CM | POA: Diagnosis not present

## 2021-09-16 ENCOUNTER — Other Ambulatory Visit: Payer: Self-pay | Admitting: Family Medicine

## 2021-09-17 ENCOUNTER — Ambulatory Visit: Payer: Medicare Other | Admitting: Family

## 2021-09-17 ENCOUNTER — Ambulatory Visit: Payer: Medicare Other | Admitting: Family Medicine

## 2021-09-17 VITALS — BP 134/76 | HR 54 | Temp 97.1°F | Resp 14 | Ht 64.0 in | Wt 195.1 lb

## 2021-09-17 DIAGNOSIS — J312 Chronic pharyngitis: Secondary | ICD-10-CM

## 2021-09-17 DIAGNOSIS — H93A1 Pulsatile tinnitus, right ear: Secondary | ICD-10-CM

## 2021-09-17 NOTE — Patient Instructions (Addendum)
Increase Flonase to 2 sprays per nostril twice daily. ? If not improving symptoms as expected.. call for possible steroid taper course. ? ? ?

## 2021-09-17 NOTE — Progress Notes (Signed)
? ? Patient ID: [name], female    DOB: July 16, 1950, 71 y.o.   MRN: [id number] ? ?This visit was conducted in person. ? ?BP 134/76   Pulse (!) 54   Temp (!) 97.1 ?F (36.2 ?C)   Resp 14   Ht '5\' 4"'$  (1.626 m)   Wt 195 lb 2 oz (88.5 kg)   SpO2 96%   BMI 33.49 kg/m?   ? ?CC:  ?Chief Complaint  ?Patient presents with  ? Sore Throat  ?  X 1 month, has been to [facility] x 2 and was tested for Covid and Flu and it was negative. She had cough with this also and was given cough medication and lidocaine solution-magic mouth wash?. Sore throat improved some. Has sinus pressure and headache. She has also went to ENT.   ? ? ?Subjective:  ? ?HPI: ?[name] is a 71 y.o. female with history of laryngal pharyngeal reflux, perennial allergic rhinitis and reactive airway disease presenting on 09/17/2021 for Sore Throat (X 1 month, has been to [facility] x 2 and was tested for Covid and Flu and it was negative. She had cough with this also and was given cough medication and lidocaine solution-magic mouth wash?. Sore throat improved some. Has sinus pressure and headache. She has also went to ENT. ) ? ? ?Reviewed urgent care visits in detail. ?Negative COVID and flu testing in office. ?She was given promethazine/DM cough syrup. ? ?Date of onset: 1 month ago ? ?She has also been seen by ENT, Dr. Constance Holster on 4/10 for right-sided pulsatile tinnitus and right eustachian tube dysfunction. ?Note reviewed in detail.  Recommended auto inflation exercises several times daily as well as imaging study to evaluate for potential vascular neoplasm causing pulsatile tinnitus. ? ?She is on Singulair 10 mg p.o. daily, fexofenadine 180 mg daily, flonase 1 spray per nostril for allergies ?She uses Flovent 110 mcg 2 puffs twice daily for reactive airways on rarely ?She is on Prevacid 15 mg twice daily and Pepcid AC 20 mg twice daily for reflux issues. ?   ? She reports ST is improving some ( 50% better), but it is worse at night.  Scratchy with swallowing. ?Using salt water gargle. ? She has pressure in face over nose, post nasal drip No facial pain  ? Right sided headache. ?  Some low grade fever 3 weeks ago. ? No recent fever. ? No GERD, no sour taste in mouth. ? ?Relevant past medical, surgical, family and social history reviewed and updated as indicated. Interim medical history since our last visit reviewed. ?Allergies and medications reviewed and updated. ?Outpatient Medications Prior to Visit  ?Medication Sig Dispense Refill  ? albuterol (VENTOLIN HFA) 108 (90 Base) MCG/ACT inhaler Inhale 2 puffs into the lungs every 6 (six) hours as needed for wheezing or shortness of breath. 8 g 2  ? atorvastatin (LIPITOR) 10 MG tablet TAKE 1 TABLET(10 MG) BY MOUTH DAILY 90 tablet 0  ? dicyclomine (BENTYL) 10 MG capsule TAKE 1 CAPSULE(10 MG) BY MOUTH EVERY 8 HOURS AS NEEDED FOR SPASMS OR ABDOMINAL PAIN 90 capsule 0  ? famotidine (PEPCID) 20 MG tablet Take 1 tablet (20 mg total) by mouth 2 (two) times daily. 180 tablet 1  ? fexofenadine (ALLERGY RELIEF) 180 MG tablet TAKE 1 TABLET(180 MG) BY MOUTH DAILY 30 tablet 4  ? fluticasone (FLOVENT HFA) 110 MCG/ACT inhaler Inhale 2 puffs into the lungs 2 (two) times daily. 1 each 5  ? imiquimod (ALDARA)  5 % cream Apply topically 3 (three) times a week. Apply to right vulva 3 times a week, use for 12 weeks 12 each 1  ? lansoprazole (PREVACID) 15 MG capsule Take 1 capsule (15 mg total) by mouth 2 (two) times daily before a meal. 180 capsule 1  ? losartan-hydrochlorothiazide (HYZAAR) 100-25 MG tablet TAKE 1 TABLET BY MOUTH EVERY DAY 90 tablet 0  ? metoprolol tartrate (LOPRESSOR) 25 MG tablet TAKE 1/2 TABLET(12.5 MG) BY MOUTH TWICE DAILY 90 tablet 0  ? montelukast (SINGULAIR) 10 MG tablet TAKE 1 TABLET(10 MG) BY MOUTH EVERY MORNING 90 tablet 3  ? promethazine-dextromethorphan (PROMETHAZINE-DM) 6.25-15 MG/5ML syrup Take 5 mLs by mouth every 4 (four) hours as needed.    ? amoxicillin-clavulanate (AUGMENTIN) 875-125 MG  tablet Take 1 tablet by mouth 2 (two) times daily. 20 tablet 0  ? methylPREDNISolone (MEDROL DOSEPAK) 4 MG TBPK tablet Take per package instructions 21 tablet 0  ? ?No facility-administered medications prior to visit.  ?  ? ?Per HPI unless specifically indicated in ROS section below ?Review of Systems  ?Constitutional:  Negative for fatigue and fever.  ?HENT:  Negative for congestion.   ?Eyes:  Negative for pain.  ?Respiratory:  Negative for cough and shortness of breath.   ?Cardiovascular:  Negative for chest pain, palpitations and leg swelling.  ?Gastrointestinal:  Negative for abdominal pain.  ?Genitourinary:  Negative for dysuria and vaginal bleeding.  ?Musculoskeletal:  Negative for back pain.  ?Neurological:  Negative for syncope, light-headedness and headaches.  ?Psychiatric/Behavioral:  Negative for dysphoric mood.   ?Objective:  ?BP 134/76   Pulse (!) 54   Temp (!) 97.1 ?F (36.2 ?C)   Resp 14   Ht '5\' 4"'$  (1.626 m)   Wt 195 lb 2 oz (88.5 kg)   SpO2 96%   BMI 33.49 kg/m?   ?Wt Readings from Last 3 Encounters:  ?09/17/21 195 lb 2 oz (88.5 kg)  ?06/19/21 191 lb (86.6 kg)  ?06/03/21 197 lb (89.4 kg)  ?  ?  ?Physical Exam ?Constitutional:   ?   General: She is not in acute distress. ?   Appearance: Normal appearance. She is well-developed. She is not ill-appearing or toxic-appearing.  ?HENT:  ?   Head: Normocephalic.  ?   Right Ear: Hearing, tympanic membrane, ear canal and external ear normal. Tympanic membrane is not erythematous, retracted or bulging.  ?   Left Ear: Hearing, tympanic membrane, ear canal and external ear normal. Tympanic membrane is not erythematous, retracted or bulging.  ?   Nose: No mucosal edema or rhinorrhea.  ?   Right Sinus: No maxillary sinus tenderness or frontal sinus tenderness.  ?   Left Sinus: No maxillary sinus tenderness or frontal sinus tenderness.  ?   Mouth/Throat:  ?   Pharynx: Uvula midline.  ?Eyes:  ?   General: Lids are normal. Lids are everted, no foreign bodies  appreciated.  ?   Conjunctiva/sclera: Conjunctivae normal.  ?   Pupils: Pupils are equal, round, and reactive to light.  ?Neck:  ?   Thyroid: No thyroid mass or thyromegaly.  ?   Vascular: No carotid bruit.  ?   Trachea: Trachea normal.  ?Cardiovascular:  ?   Rate and Rhythm: Normal rate and regular rhythm.  ?   Pulses: Normal pulses.  ?   Heart sounds: Normal heart sounds, S1 normal and S2 normal. No murmur heard. ?  No friction rub. No gallop.  ?Pulmonary:  ?   Effort: Pulmonary effort  is normal. No tachypnea or respiratory distress.  ?   Breath sounds: Normal breath sounds. No decreased breath sounds, wheezing, rhonchi or rales.  ?Abdominal:  ?   General: Bowel sounds are normal.  ?   Palpations: Abdomen is soft.  ?   Tenderness: There is no abdominal tenderness.  ?Musculoskeletal:  ?   Cervical back: Normal range of motion and neck supple.  ?Skin: ?   General: Skin is warm and dry.  ?   Findings: No rash.  ?Neurological:  ?   Mental Status: She is alert.  ?Psychiatric:     ?   Mood and Affect: Mood is not anxious or depressed.     ?   Speech: Speech normal.     ?   Behavior: Behavior normal. Behavior is cooperative.     ?   Thought Content: Thought content normal.     ?   Judgment: Judgment normal.  ? ?   ?Results for orders placed or performed in visit on 09/17/20  ?Comprehensive metabolic panel  ?Result Value Ref Range  ? Sodium 140 135 - 145 mEq/L  ? Potassium 3.7 3.5 - 5.1 mEq/L  ? Chloride 102 96 - 112 mEq/L  ? CO2 30 19 - 32 mEq/L  ? Glucose, Bld 87 70 - 99 mg/dL  ? BUN 16 6 - 23 mg/dL  ? Creatinine, Ser 0.85 0.40 - 1.20 mg/dL  ? Total Bilirubin 0.7 0.2 - 1.2 mg/dL  ? Alkaline Phosphatase 43 39 - 117 U/L  ? AST 19 0 - 37 U/L  ? ALT 24 0 - 35 U/L  ? Total Protein 7.0 6.0 - 8.3 g/dL  ? Albumin 4.0 3.5 - 5.2 g/dL  ? GFR 70.76 >60.00 mL/min  ? Calcium 9.5 8.4 - 10.5 mg/dL  ?Lipid panel  ?Result Value Ref Range  ? Cholesterol 212 (H) 0 - 200 mg/dL  ? Triglycerides 139.0 0.0 - 149.0 mg/dL  ? HDL 71.80 >39.00  mg/dL  ? VLDL 27.8 0.0 - 40.0 mg/dL  ? LDL Cholesterol 112 (H) 0 - 99 mg/dL  ? Total CHOL/HDL Ratio 3   ? NonHDL 140.26   ? ? ?This visit occurred during the SARS-CoV-2 public health emergency.  Safety protocols were in place,

## 2021-09-17 NOTE — Assessment & Plan Note (Signed)
Chronic, improving ? ?No current sign of viral or bacterial infection. ?Your throat is most likely due to postnasal drip and allergies.  Less likely reflux although she is on optimize treatment. ? ?We will start with increasing her Flonase to maximum dose.  If this does not improve her symptoms further then we will consider a course of prednisone. ?Also continue to avoid reflux triggers. ? ?

## 2021-09-17 NOTE — Assessment & Plan Note (Signed)
Acute, no improvement ? ?She is currently being followed by Dr. Constance Holster ENT.  She has pending imaging to evaluate for vascular lesion. ?

## 2021-09-18 ENCOUNTER — Other Ambulatory Visit: Payer: Self-pay | Admitting: Family Medicine

## 2021-09-21 ENCOUNTER — Other Ambulatory Visit: Payer: Self-pay | Admitting: Internal Medicine

## 2021-09-24 ENCOUNTER — Other Ambulatory Visit: Payer: Self-pay | Admitting: Otolaryngology

## 2021-09-24 DIAGNOSIS — H93A1 Pulsatile tinnitus, right ear: Secondary | ICD-10-CM

## 2021-09-24 DIAGNOSIS — H6981 Other specified disorders of Eustachian tube, right ear: Secondary | ICD-10-CM

## 2021-09-28 ENCOUNTER — Telehealth: Payer: Self-pay | Admitting: Family Medicine

## 2021-09-28 DIAGNOSIS — E7800 Pure hypercholesterolemia, unspecified: Secondary | ICD-10-CM

## 2021-09-28 DIAGNOSIS — D649 Anemia, unspecified: Secondary | ICD-10-CM

## 2021-09-28 NOTE — Telephone Encounter (Signed)
-----   Message from Velna Hatchet, RT sent at 09/21/2021  9:14 AM EDT ----- ?Regarding: Lab Tue 10/06/21 ?Patient has AWV appt on 10/06/21.  Lab orders needed, please.  Thanks,  Anda Kraft ? ?

## 2021-10-06 ENCOUNTER — Other Ambulatory Visit (INDEPENDENT_AMBULATORY_CARE_PROVIDER_SITE_OTHER): Payer: Medicare Other

## 2021-10-06 DIAGNOSIS — M5442 Lumbago with sciatica, left side: Secondary | ICD-10-CM | POA: Diagnosis not present

## 2021-10-06 DIAGNOSIS — M9905 Segmental and somatic dysfunction of pelvic region: Secondary | ICD-10-CM | POA: Diagnosis not present

## 2021-10-06 DIAGNOSIS — E7800 Pure hypercholesterolemia, unspecified: Secondary | ICD-10-CM | POA: Diagnosis not present

## 2021-10-06 DIAGNOSIS — D649 Anemia, unspecified: Secondary | ICD-10-CM

## 2021-10-06 DIAGNOSIS — M9903 Segmental and somatic dysfunction of lumbar region: Secondary | ICD-10-CM | POA: Diagnosis not present

## 2021-10-06 DIAGNOSIS — M955 Acquired deformity of pelvis: Secondary | ICD-10-CM | POA: Diagnosis not present

## 2021-10-06 LAB — LIPID PANEL
Cholesterol: 175 mg/dL (ref 0–200)
HDL: 64.5 mg/dL (ref >39.00)
LDL Cholesterol: 83 mg/dL (ref 0–99)
NonHDL: 110.87
Total CHOL/HDL Ratio: 3
Triglycerides: 137 mg/dL (ref 0.0–149.0)
VLDL: 27.4 mg/dL (ref 0.0–40.0)

## 2021-10-06 LAB — COMPREHENSIVE METABOLIC PANEL
ALT: 17 U/L (ref 0–35)
AST: 18 U/L (ref 0–37)
Albumin: 4.1 g/dL (ref 3.5–5.2)
Alkaline Phosphatase: 58 U/L (ref 39–117)
BUN: 12 mg/dL (ref 6–23)
CO2: 31 mEq/L (ref 19–32)
Calcium: 9.4 mg/dL (ref 8.4–10.5)
Chloride: 100 mEq/L (ref 96–112)
Creatinine, Ser: 1.05 mg/dL (ref 0.40–1.20)
GFR: 54.51 mL/min — ABNORMAL LOW (ref >60.00)
Glucose, Bld: 92 mg/dL (ref 70–99)
Potassium: 3.6 mEq/L (ref 3.5–5.1)
Sodium: 139 mEq/L (ref 135–145)
Total Bilirubin: 0.5 mg/dL (ref 0.2–1.2)
Total Protein: 7.2 g/dL (ref 6.0–8.3)

## 2021-10-06 LAB — CBC WITH DIFFERENTIAL/PLATELET
Basophils Absolute: 0.1 10*3/uL (ref 0.0–0.1)
Basophils Relative: 0.9 % (ref 0.0–3.0)
Eosinophils Absolute: 0.2 10*3/uL (ref 0.0–0.7)
Eosinophils Relative: 2.7 % (ref 0.0–5.0)
HCT: 38.2 % (ref 36.0–46.0)
Hemoglobin: 12.8 g/dL (ref 12.0–15.0)
Lymphocytes Relative: 37.2 % (ref 12.0–46.0)
Lymphs Abs: 2.8 10*3/uL (ref 0.7–4.0)
MCHC: 33.4 g/dL (ref 30.0–36.0)
MCV: 93 fl (ref 78.0–100.0)
Monocytes Absolute: 0.6 10*3/uL (ref 0.1–1.0)
Monocytes Relative: 7.4 % (ref 3.0–12.0)
Neutro Abs: 3.9 10*3/uL (ref 1.4–7.7)
Neutrophils Relative %: 51.8 % (ref 43.0–77.0)
Platelets: 289 10*3/uL (ref 150.0–400.0)
RBC: 4.11 Mil/uL (ref 3.87–5.11)
RDW: 12.6 % (ref 11.5–15.5)
WBC: 7.6 10*3/uL (ref 4.0–10.5)

## 2021-10-06 LAB — IBC + FERRITIN
Ferritin: 96.3 ng/mL (ref 10.0–291.0)
Iron: 79 ug/dL (ref 42–145)
Saturation Ratios: 26.7 % (ref 20.0–50.0)
TIBC: 295.4 ug/dL (ref 250.0–450.0)
Transferrin: 211 mg/dL — ABNORMAL LOW (ref 212.0–360.0)

## 2021-10-07 DIAGNOSIS — H524 Presbyopia: Secondary | ICD-10-CM | POA: Diagnosis not present

## 2021-10-08 ENCOUNTER — Ambulatory Visit
Admission: RE | Admit: 2021-10-08 | Discharge: 2021-10-08 | Disposition: A | Discharge status: Home / Self Care | Payer: Medicare Other | Source: Ambulatory Visit | Attending: Otolaryngology | Admitting: Otolaryngology

## 2021-10-08 DIAGNOSIS — H93A1 Pulsatile tinnitus, right ear: Secondary | ICD-10-CM | POA: Diagnosis not present

## 2021-10-08 DIAGNOSIS — H6981 Other specified disorders of Eustachian tube, right ear: Secondary | ICD-10-CM

## 2021-10-08 IMAGING — MR MR MRA NECK WO/W CM
4 of 5 series · 36 of 48 positions shown · IV contrast (19 mL Multihance)
Comparison: None Available.

CLINICAL DATA: Dysfunction of right eustachian tube [Z8]
([Z8]-CM). Pulsatile tinnitus of right ear H93.A1 ([Z8]-CM)

EXAM:
MRA NECK WITHOUT AND WITH CONTRAST
TECHNIQUE: Multiplanar and multiecho pulse sequences of the neck were obtained
without and with intravenous contrast. Angiographic images of the
neck were obtained using MRA technique without and with intravenous
contrast. 3D post processing was performed.
CONTRAST:  19mL MULTIHANCE GADOBENATE DIMEGLUMINE 529 MG/ML IV SOLN

[Series 6: tof_2d neck non · axial · 3.0mm · 0.65mm/px · z∈[-49,+166]mm · 13 of 108 slices shown]
[im 1/108]
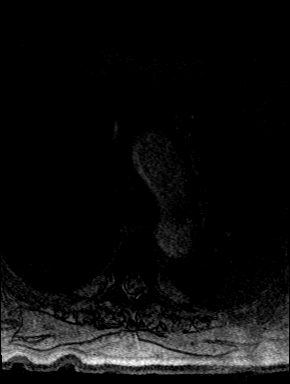
[im 9/108]
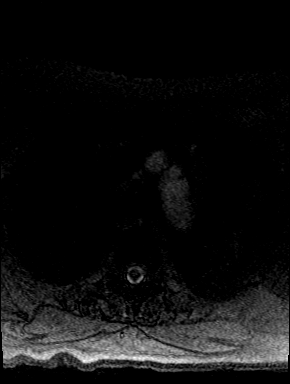
[im 18/108]
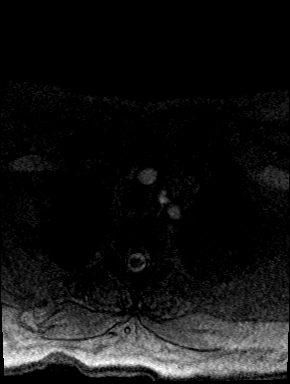
[im 27/108]
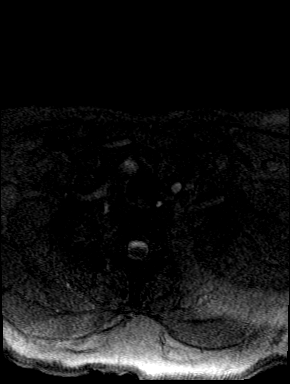
[im 36/108]
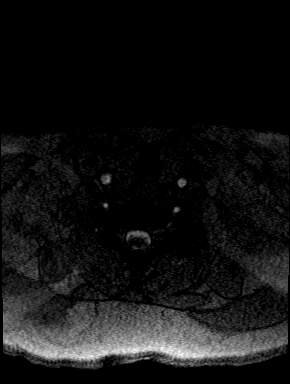
[im 45/108]
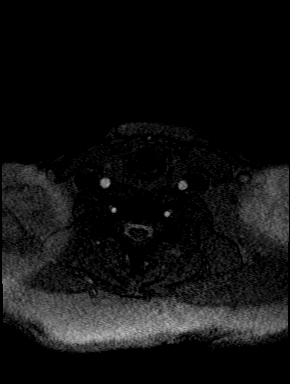
[im 54/108]
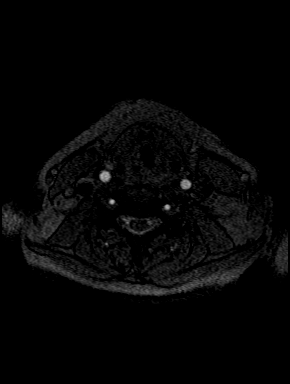
[im 63/108]
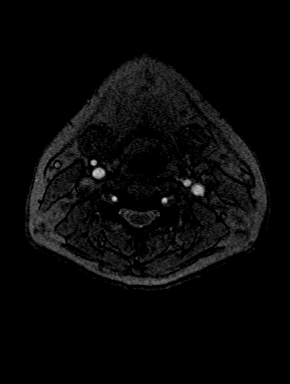
[im 72/108]
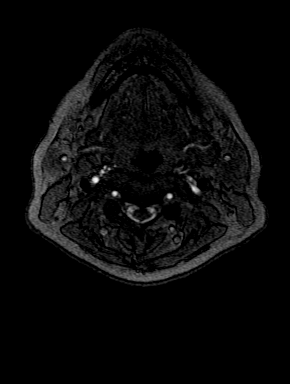
[im 81/108]
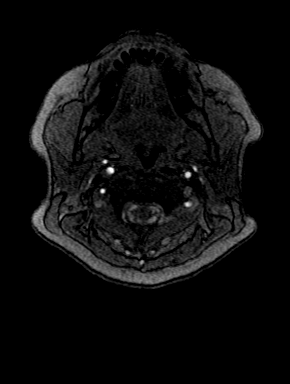
[im 90/108]
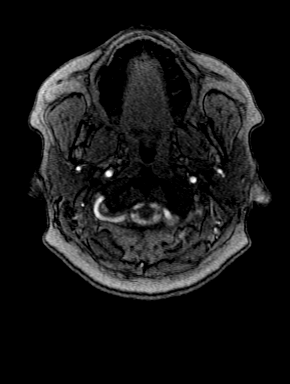
[im 99/108]
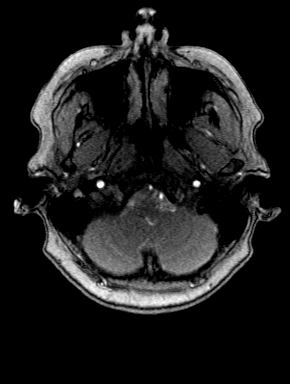
[im 108/108]
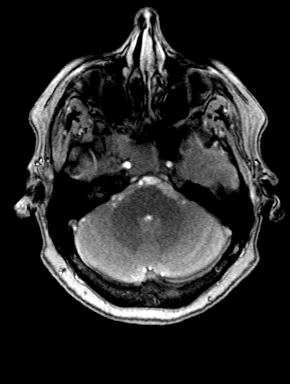

[Series 14: angio_fl3d_cor_post · coronal · 0.8mm · 0.85mm/px · 11 of 96 slices shown]
[im 1/96]
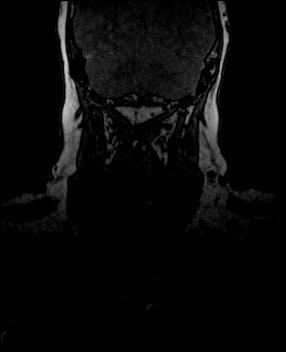
[im 10/96]
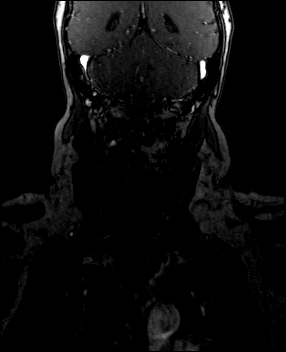
[im 20/96]
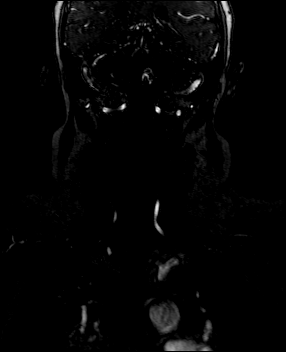
[im 29/96]
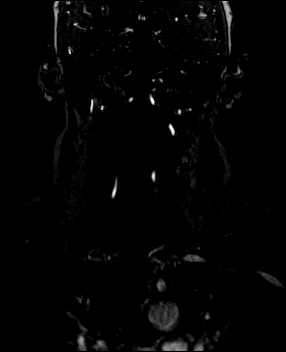
[im 39/96]
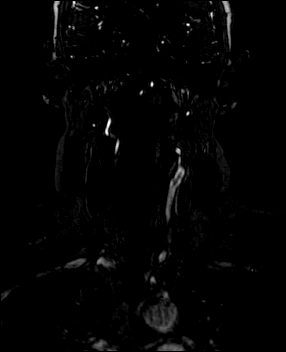
[im 48/96]
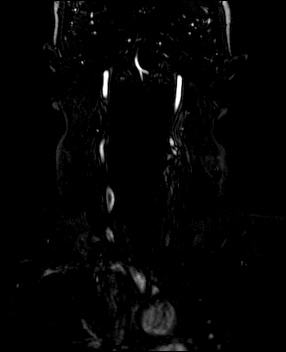
[im 58/96]
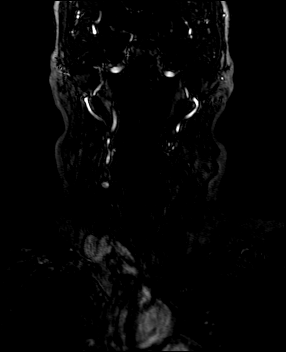
[im 67/96]
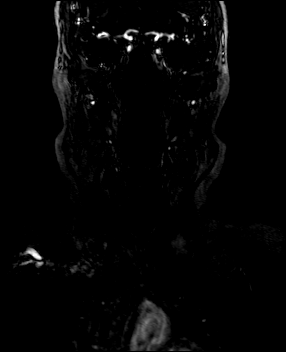
[im 77/96]
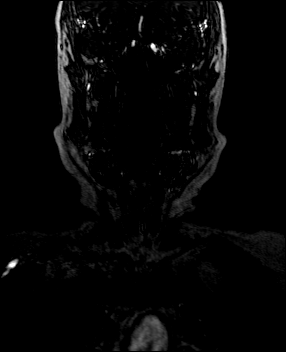
[im 86/96]
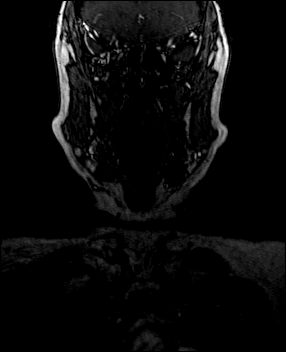
[im 96/96]
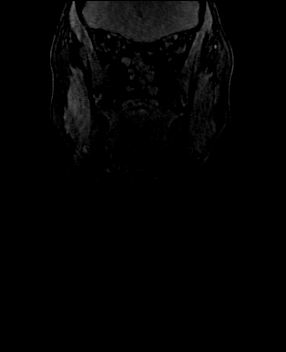

[Series 15: angio_fl3d_cor_post_sub · coronal · 0.8mm · 0.85mm/px · 11 of 96 slices shown]
[im 1/96]
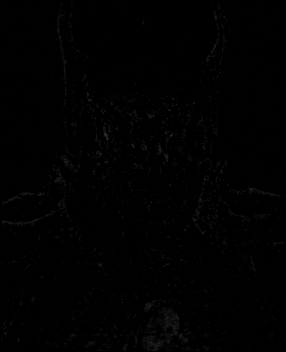
[im 10/96]
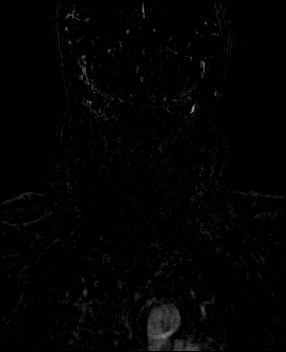
[im 20/96]
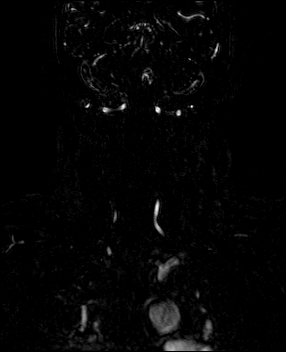
[im 29/96]
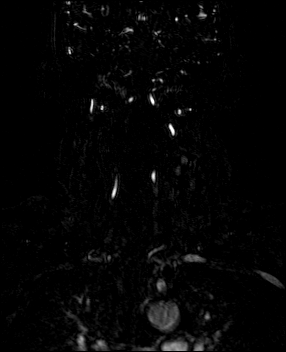
[im 39/96]
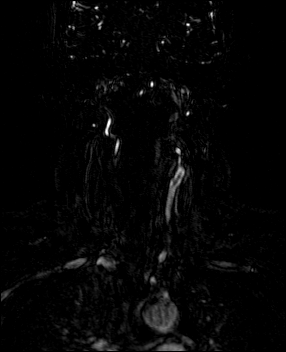
[im 48/96]
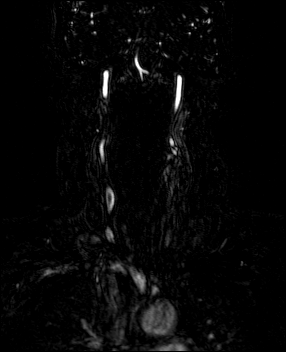
[im 58/96]
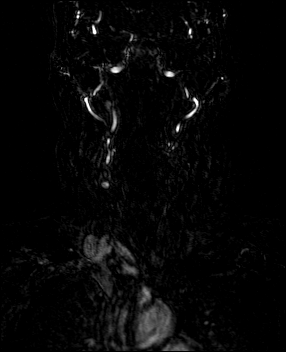
[im 67/96]
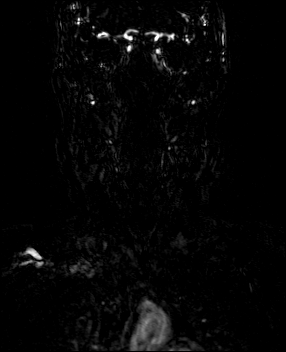
[im 77/96]
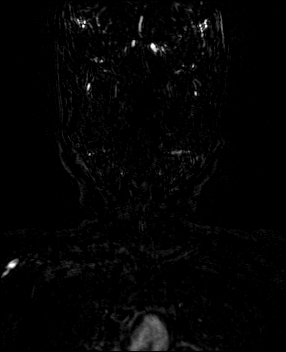
[im 86/96]
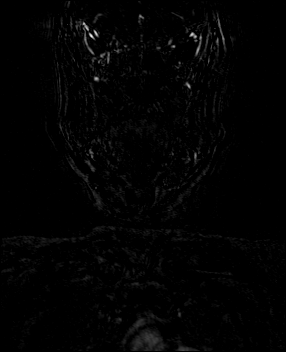
[im 96/96]
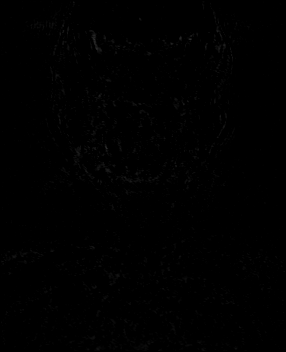

[Series 102: MRA · axial · 0.62mm/px · 1 of 1 slices shown]
[im 1/1]
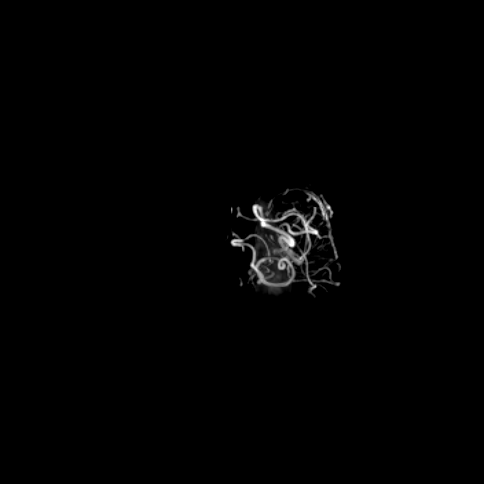

[36 of 48 positions shown; findings below may reference images not displayed]

FINDINGS: Aortic arch: Normal 3 vessel aortic branching pattern. The
visualized subclavian arteries are normal.

Right carotid system: Normal course and caliber without stenosis or
evidence of dissection.

Left carotid system: Normal course and caliber without stenosis or
evidence of dissection.

Vertebral arteries: Codominant. Vertebral artery origins are normal.
Vertebral arteries are normal in course and caliber to the
vertebrobasilar confluence without stenosis or evidence of
dissection.

Other: Limited evaluation of the intracranial circulation show a 2
mm outpouching from the proximal M1 segment of the left MCA (series
103, image [DATE] represent a small aneurysm.
IMPRESSION: 1. Unremarkable MR angiogram of the neck.
2. Limited evaluation of the intracranial circulation show a 2 mm
outpouching from the proximal M1 segment of the left MCA, may
represent a small aneurysm. Correlation with MR angiogram of the
brain is recommended.

## 2021-10-08 MED ORDER — GADOBENATE DIMEGLUMINE 529 MG/ML IV SOLN
19.0000 mL | Freq: Once | INTRAVENOUS | Status: AC | PRN
  Administered 2021-10-08: 19 mL via INTRAVENOUS

## 2021-10-15 ENCOUNTER — Encounter: Payer: Self-pay | Admitting: Family Medicine

## 2021-10-15 ENCOUNTER — Ambulatory Visit: Payer: Medicare Other | Admitting: Family Medicine

## 2021-10-15 VITALS — BP 114/78 | HR 65 | Ht 63.5 in | Wt 197.2 lb

## 2021-10-15 DIAGNOSIS — H93A1 Pulsatile tinnitus, right ear: Secondary | ICD-10-CM

## 2021-10-15 DIAGNOSIS — Z0000 Encounter for general adult medical examination without abnormal findings: Secondary | ICD-10-CM

## 2021-10-15 DIAGNOSIS — C519 Malignant neoplasm of vulva, unspecified: Secondary | ICD-10-CM

## 2021-10-15 DIAGNOSIS — E7800 Pure hypercholesterolemia, unspecified: Secondary | ICD-10-CM | POA: Diagnosis not present

## 2021-10-15 DIAGNOSIS — R053 Chronic cough: Secondary | ICD-10-CM

## 2021-10-15 DIAGNOSIS — I10 Essential (primary) hypertension: Secondary | ICD-10-CM | POA: Diagnosis not present

## 2021-10-15 DIAGNOSIS — D649 Anemia, unspecified: Secondary | ICD-10-CM

## 2021-10-15 NOTE — Assessment & Plan Note (Signed)
Possible aneurysm left  MCA... not causing tinnitus but seen in work up. ? Has MRI brain angio pending. ? ?

## 2021-10-15 NOTE — Assessment & Plan Note (Signed)
Chronic.. will try holding BBlocker to see if contributing. ?

## 2021-10-15 NOTE — Assessment & Plan Note (Signed)
Stable, chronic.  Continue current medication except trial of holding metoprolol to see if helps with chronic cough with singing. ? ? ? ?

## 2021-10-15 NOTE — Progress Notes (Signed)
? ? Patient ID: [name], female    DOB: 06/27/1950, 71 y.o.   MRN: [id number] ? ?This visit was conducted in person. ? ?BP 114/78   Pulse 65   Ht 5' 3.5" (1.613 m)   Wt 197 lb 3.2 oz (89.4 kg)   SpO2 96%   BMI 34.38 kg/m?   ? ?CC:  ?Chief Complaint  ?Patient presents with  ? Annual Exam  ?  Physical and  discuss mri of brain  , coughing   ? ? ?Subjective:  ? ?HPI: ?[name] is a 71 y.o. female presenting on 10/15/2021 for Annual Exam (Physical and  discuss mri of brain  , coughing ) ?The patient presents for annual medicare wellness, complete physical and review of chronic health problems. He/She also has the following acute concerns today: ? ?The patient saw a LPN or RN for medicare wellness visit. ? Prevention and wellness was reviewed in detail. ?Note reviewed and important notes copied below. ? ?Hypertension: Well-controlled on metoprolol 12.5 mg p.o. twice daily ( had some palpitations associated) On losartan HCTZ 100./25 1 tablet daily. ?Has asthma and chronic cough.. could BBlocker be worsening this? ?BP Readings from Last 3 Encounters:  ?10/15/21 114/78  ?09/17/21 134/76  ?05/15/21 132/72  ?Using medication without problems or lightheadedness:  none ?Chest pain with exertion: none ?Edema: none ?Short of breath: none.. cough ongoing chronically with singing.. Dx of asthma in alst 2 years. ?Average home BPs: ?Other issues: ? ?Elevated Cholesterol: LDL at goal on atorvastatin 10 mg p.o. daily ?Lab Results  ?Component Value Date  ? CHOL 175 10/06/2021  ? HDL 64.50 10/06/2021  ? [place] 83 10/06/2021  ? LDLDIRECT 122.5 07/14/2009  ? TRIG 137.0 10/06/2021  ? CHOLHDL 3 10/06/2021  ?Using medications without problems: none ?Muscle aches:  none ?Diet compliance  good ?Exercise: walking ?The 10-year ASCVD risk score (Arnett DK, et al., 2019) is: 7.4% ?  Values used to calculate the score: ?    Age: 79 years ?    Sex: Female ?    Is Non-Hispanic African American: No ?    Diabetic: No ?    Tobacco smoker:  No ?    Systolic Blood Pressure: 976 mmHg ?    Is BP treated: Yes ?    HDL Cholesterol: 64.5 mg/dL ?    Total Cholesterol: 175 mg/dL ? ?Other complaints: ? ?She has been seeing ENT Dr. Constance Holster for pulsatile tinnitus of right ear ?5/4 MR neck: ?  IMPRESSION: ?1. Unremarkable MR angiogram of the neck. ?2. Limited evaluation of the intracranial circulation show a 2 mm ?outpouching from the proximal M1 segment of the left MCA, may ?represent a small aneurysm. Correlation with MR angiogram of the ?brain is recommended. ? ?Paget's disease of vulva followed by gynecology. ? ?Relevant past medical, surgical, family and social history reviewed and updated as indicated. Interim medical history since our last visit reviewed. ?Allergies and medications reviewed and updated. ?Outpatient Medications Prior to Visit  ?Medication Sig Dispense Refill  ? albuterol (VENTOLIN HFA) 108 (90 Base) MCG/ACT inhaler Inhale 2 puffs into the lungs every 6 (six) hours as needed for wheezing or shortness of breath. 8 g 2  ? atorvastatin (LIPITOR) 10 MG tablet TAKE 1 TABLET(10 MG) BY MOUTH DAILY 90 tablet 0  ? dicyclomine (BENTYL) 10 MG capsule TAKE 1 CAPSULE(10 MG) BY MOUTH EVERY 8 HOURS AS NEEDED FOR SPASMS OR ABDOMINAL PAIN 90 capsule 0  ? famotidine (PEPCID) 20 MG tablet Take 1  tablet (20 mg total) by mouth 2 (two) times daily. 180 tablet 1  ? fexofenadine (ALLERGY RELIEF) 180 MG tablet TAKE 1 TABLET(180 MG) BY MOUTH DAILY 30 tablet 4  ? fluticasone (FLOVENT HFA) 110 MCG/ACT inhaler Inhale 2 puffs into the lungs 2 (two) times daily. 1 each 5  ? imiquimod (ALDARA) 5 % cream Apply topically 3 (three) times a week. Apply to right vulva 3 times a week, use for 12 weeks 12 each 1  ? lansoprazole (PREVACID) 15 MG capsule Take 1 capsule (15 mg total) by mouth 2 (two) times daily before a meal. 180 capsule 1  ? losartan-hydrochlorothiazide (HYZAAR) 100-25 MG tablet TAKE 1 TABLET BY MOUTH EVERY DAY 90 tablet 0  ? metoprolol tartrate (LOPRESSOR) 25 MG  tablet TAKE 1/2 TABLET(12.5 MG) BY MOUTH TWICE DAILY 90 tablet 0  ? montelukast (SINGULAIR) 10 MG tablet TAKE 1 TABLET(10 MG) BY MOUTH EVERY MORNING 90 tablet 3  ? promethazine-dextromethorphan (PROMETHAZINE-DM) 6.25-15 MG/5ML syrup Take 5 mLs by mouth every 4 (four) hours as needed.    ? ?No facility-administered medications prior to visit.  ?  ? ?Per HPI unless specifically indicated in ROS section below ?Review of Systems ?Objective:  ?BP 114/78   Pulse 65   Ht 5' 3.5" (1.613 m)   Wt 197 lb 3.2 oz (89.4 kg)   SpO2 96%   BMI 34.38 kg/m?   ?Wt Readings from Last 3 Encounters:  ?10/15/21 197 lb 3.2 oz (89.4 kg)  ?09/17/21 195 lb 2 oz (88.5 kg)  ?06/19/21 191 lb (86.6 kg)  ?  ?  ?Physical Exam ?   ?Results for orders placed or performed in visit on 10/06/21  ?Lipid panel  ?Result Value Ref Range  ? Cholesterol 175 0 - 200 mg/dL  ? Triglycerides 137.0 0.0 - 149.0 mg/dL  ? HDL 64.50 >39.00 mg/dL  ? VLDL 27.4 0.0 - 40.0 mg/dL  ? LDL Cholesterol 83 0 - 99 mg/dL  ? Total CHOL/HDL Ratio 3   ? NonHDL 110.87   ?Comprehensive metabolic panel  ?Result Value Ref Range  ? Sodium 139 135 - 145 mEq/L  ? Potassium 3.6 3.5 - 5.1 mEq/L  ? Chloride 100 96 - 112 mEq/L  ? CO2 31 19 - 32 mEq/L  ? Glucose, Bld 92 70 - 99 mg/dL  ? BUN 12 6 - 23 mg/dL  ? Creatinine, Ser 1.05 0.40 - 1.20 mg/dL  ? Total Bilirubin 0.5 0.2 - 1.2 mg/dL  ? Alkaline Phosphatase 58 39 - 117 U/L  ? AST 18 0 - 37 U/L  ? ALT 17 0 - 35 U/L  ? Total Protein 7.2 6.0 - 8.3 g/dL  ? Albumin 4.1 3.5 - 5.2 g/dL  ? GFR 54.51 (L) >60.00 mL/min  ? Calcium 9.4 8.4 - 10.5 mg/dL  ?CBC with Differential/Platelet  ?Result Value Ref Range  ? WBC 7.6 4.0 - 10.5 K/uL  ? RBC 4.11 3.87 - 5.11 Mil/uL  ? Hemoglobin 12.8 12.0 - 15.0 g/dL  ? HCT 38.2 36.0 - 46.0 %  ? MCV 93.0 78.0 - 100.0 fl  ? MCHC 33.4 30.0 - 36.0 g/dL  ? RDW 12.6 11.5 - 15.5 %  ? Platelets 289.0 150.0 - 400.0 K/uL  ? Neutrophils Relative % 51.8 43.0 - 77.0 %  ? Lymphocytes Relative 37.2 12.0 - 46.0 %  ? Monocytes  Relative 7.4 3.0 - 12.0 %  ? Eosinophils Relative 2.7 0.0 - 5.0 %  ? Basophils Relative 0.9 0.0 - 3.0 %  ?  Neutro Abs 3.9 1.4 - 7.7 K/uL  ? Lymphs Abs 2.8 0.7 - 4.0 K/uL  ? Monocytes Absolute 0.6 0.1 - 1.0 K/uL  ? Eosinophils Absolute 0.2 0.0 - 0.7 K/uL  ? Basophils Absolute 0.1 0.0 - 0.1 K/uL  ?IBC + Ferritin  ?Result Value Ref Range  ? Iron 79 42 - 145 ug/dL  ? Transferrin 211.0 (L) 212.0 - 360.0 mg/dL  ? Saturation Ratios 26.7 20.0 - 50.0 %  ? Ferritin 96.3 10.0 - 291.0 ng/mL  ? TIBC 295.4 250.0 - 450.0 mcg/dL  ? ? ? ?COVID 19 screen:  No recent travel or known exposure to [place] ?The patient denies respiratory symptoms of COVID 19 at this time. ?The importance of social distancing was discussed today.  ? ?Assessment and Plan ?The patient's preventative maintenance and recommended screening tests for an annual wellness exam were reviewed in full today. ?Brought up to date unless services declined. ? ?Counselled on the importance of diet, exercise, and its role in overall health and mortality. ?The patient's FH and SH was reviewed, including their home life, tobacco status, and drug and alcohol status.  ?  ?Vaccines: Uptodate with Tdap,Shingrix. Given flu, PNA 23 today. ?  Discussed COVID19 vaccine side effects and benefits. Strongly encouraged the patient to get the vaccine. Questions answered. ?PAP/DVE: Followed by GYN, partial hysterectomy.. Pap not indicated. ?Mammo: 07/31/2019 [place] recommended,  [place] okay, repeat 02/2020 unremarkable.. due ?Colon: nml 2017 Dr. Carlean Purl, repeat in 10 years. ?Hep C : done ?  DEXA 07/2019 osteopenia, plan repeat in 2026. ?  ?Problem List Items Addressed This Visit   ? ? Essential hypertension, benign (Chronic)  ?  Stable, chronic.  Continue current medication except trial of holding metoprolol to see if helps with chronic cough with singing. ? ? ? ?  ?  ? Hypercholesteremia (Chronic)  ?  Stable, chronic.  Continue current medication. ? ? ? atorvastatin 10 mg daily ?  ?  ? Paget's  disease of vulva ([place]) (Chronic)  ?   Chronic, followed by GYN ? ?  ?  ? Anemia  ?  Resolved. ? ?  ?  ? Chronic cough  ?  Chronic.. will try holding BBlocker to see if contributing. ? ?  ?  ? Pulsatile tinnitus of right e

## 2021-10-15 NOTE — Assessment & Plan Note (Signed)
Stable, chronic.  Continue current medication.    atorvastatin 10 mg daily 

## 2021-10-15 NOTE — Patient Instructions (Addendum)
Can try a trail off metoprolol to see if chronic cough and asthma is improved. Make sure to follow BP at home while off.. Goal < 140/90. ? Work on increase water intake. ?Please call the location of your choice from the menu below to schedule your Mammogram and/or Bone Density appointment.   ? ?[place]  ? ?[facility]                ?      Phone:  [phone] ?[address]                               ?[place], [place] [address]                                                             ?Services: Traditional and 3D Mammogram, Bone Density  ? ?[facility]           ?      Phone: [phone] ?[address]                                                       ?[place], [place] [address]    ?Service: Bone Density ONLY  ? *this site does NOT perform mammograms ? ?[facility]                       ? Phone:  [phone] ?[address]                                  ?[place], [place] [address]                                            ?Services:  3D Mammogram and Bone Density  ? ? ?[place] ? ?[place] [facility]   ?Phone:  [phone]   ?[place], [place] [address]                                            ?Services: 3D Mammogram and Bone Density ? ?[place] [facility])  ?Phone:  [phone]   ?[address]                        ?  [place], [place] [address]                                              ?Services:  3D Mammogram and Bone Density ? ? ?

## 2021-10-15 NOTE — Assessment & Plan Note (Signed)
Chronic, followed by GYN ?

## 2021-10-15 NOTE — Assessment & Plan Note (Signed)
Resolved

## 2021-10-16 ENCOUNTER — Encounter: Payer: Self-pay | Admitting: Family Medicine

## 2021-10-16 ENCOUNTER — Telehealth: Payer: Self-pay | Admitting: Family Medicine

## 2021-10-16 NOTE — Telephone Encounter (Signed)
Pt called and stated she has a sinus infection, mucus is a different color. She was seen yesterday by [facility] for a AWV, was held up talking about an MRI and forgot to bring up the sinus infection. Just wants to speak with the nurse and wanted the nurse and [facility] to know about this. ? ?Callback Number: [phone] ?

## 2021-10-16 NOTE — Telephone Encounter (Signed)
I have sent the patient a my chart message requesting further information regarding her sinus symptoms.  Please call to let her know to look for my note on my chart ?

## 2021-10-16 NOTE — Telephone Encounter (Signed)
Spoke with Butch Penny.  She is logging on to her MyChart now. ? ?

## 2021-10-18 NOTE — Telephone Encounter (Signed)
I agree.  I do not see where you sent Mathew a MyChart message on Friday.  Please advise.  ?

## 2021-10-27 DIAGNOSIS — M9903 Segmental and somatic dysfunction of lumbar region: Secondary | ICD-10-CM | POA: Diagnosis not present

## 2021-10-27 DIAGNOSIS — M955 Acquired deformity of pelvis: Secondary | ICD-10-CM | POA: Diagnosis not present

## 2021-10-27 DIAGNOSIS — M9905 Segmental and somatic dysfunction of pelvic region: Secondary | ICD-10-CM | POA: Diagnosis not present

## 2021-10-27 DIAGNOSIS — M5442 Lumbago with sciatica, left side: Secondary | ICD-10-CM | POA: Diagnosis not present

## 2021-10-30 ENCOUNTER — Other Ambulatory Visit: Payer: Self-pay | Admitting: Otolaryngology

## 2021-10-30 DIAGNOSIS — H93A1 Pulsatile tinnitus, right ear: Secondary | ICD-10-CM

## 2021-11-09 ENCOUNTER — Other Ambulatory Visit: Payer: Self-pay | Admitting: Internal Medicine

## 2021-11-10 DIAGNOSIS — M9905 Segmental and somatic dysfunction of pelvic region: Secondary | ICD-10-CM | POA: Diagnosis not present

## 2021-11-10 DIAGNOSIS — M955 Acquired deformity of pelvis: Secondary | ICD-10-CM | POA: Diagnosis not present

## 2021-11-10 DIAGNOSIS — M5442 Lumbago with sciatica, left side: Secondary | ICD-10-CM | POA: Diagnosis not present

## 2021-11-10 DIAGNOSIS — M9903 Segmental and somatic dysfunction of lumbar region: Secondary | ICD-10-CM | POA: Diagnosis not present

## 2021-11-11 ENCOUNTER — Ambulatory Visit
Admission: RE | Admit: 2021-11-11 | Discharge: 2021-11-11 | Disposition: A | Discharge status: Home / Self Care | Payer: Medicare Other | Source: Ambulatory Visit | Attending: Otolaryngology | Admitting: Otolaryngology

## 2021-11-11 DIAGNOSIS — H93A1 Pulsatile tinnitus, right ear: Secondary | ICD-10-CM

## 2021-11-11 IMAGING — MR MR MRA HEAD W/O CM
1 series · 11 of 48 positions shown · non-contrast
Comparison: MR angiogram of the neck [DATE].

CLINICAL DATA: Pulsatile tinnitus of right ear H93.A1 ([1L]-CM).

EXAM:
MRA HEAD WITHOUT CONTRAST
TECHNIQUE: Angiographic images of the Circle of Willis were acquired using MRA
technique without intravenous contrast.

[Series 5: [id number] · axial · 0.6mm · 0.26mm/px · z∈[-71,+14]mm · 11 of 160 slices shown]
[im 11/160]
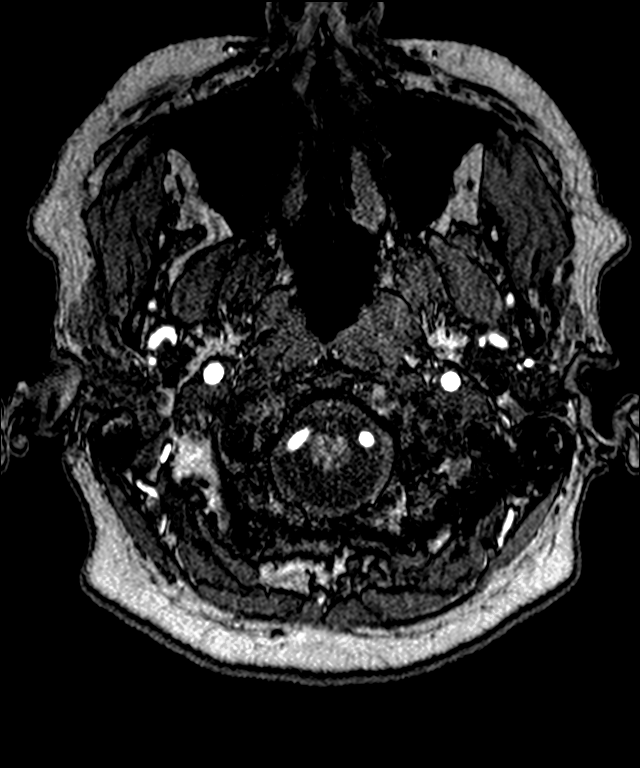
[im 28/160]
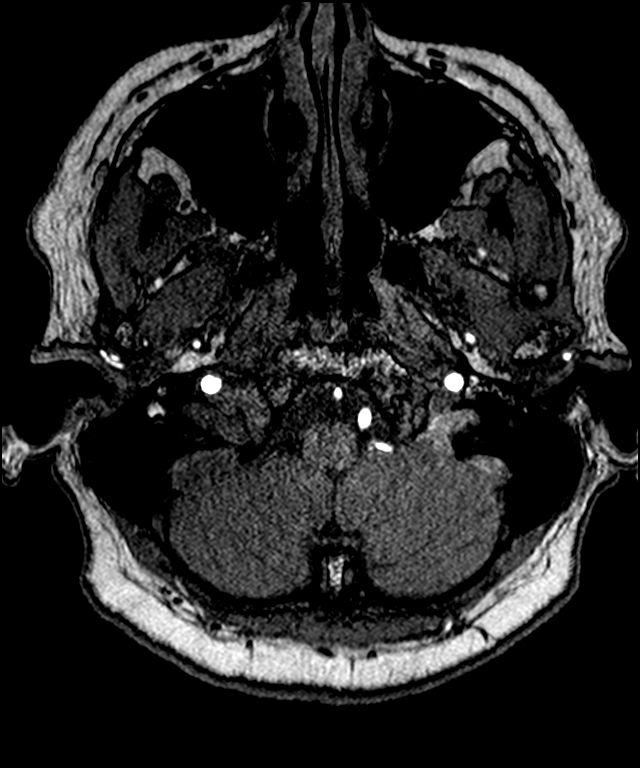
[im 31/160]
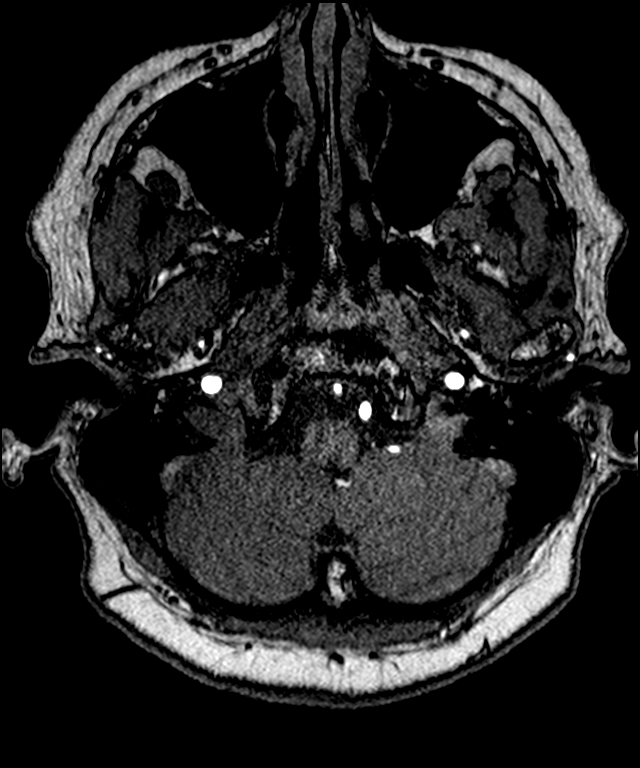
[im 51/160]
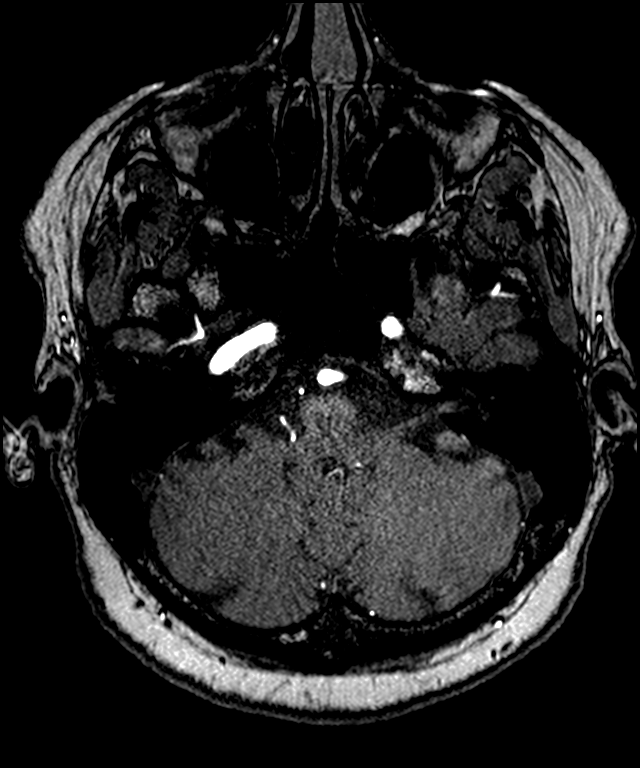
[im 72/160]
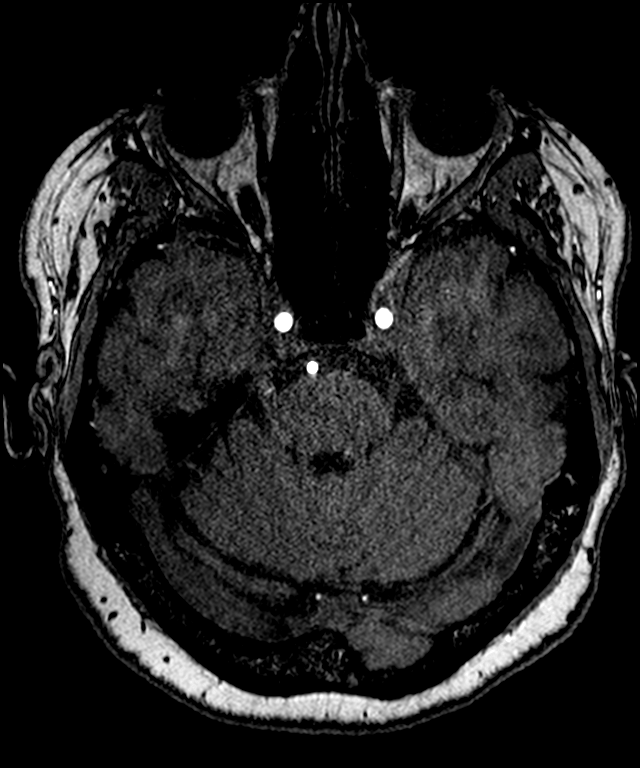
[im 82/160]
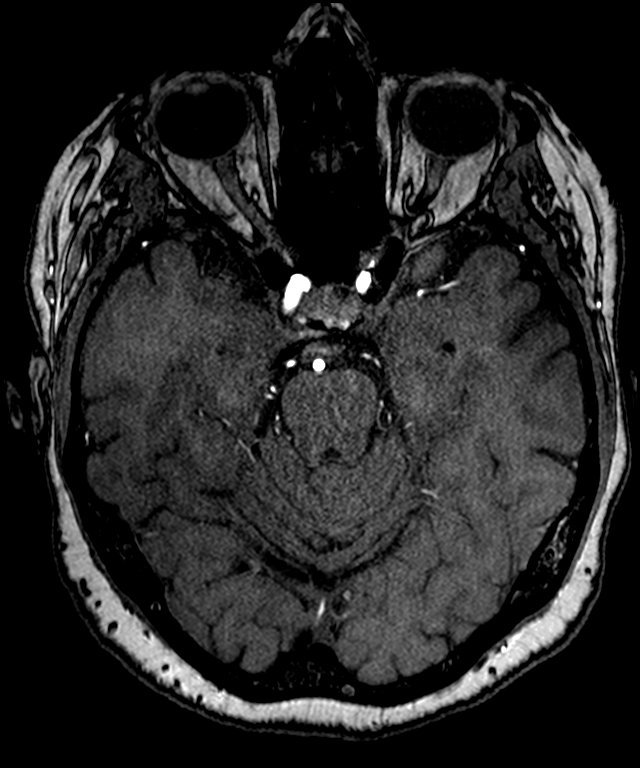
[im 92/160]
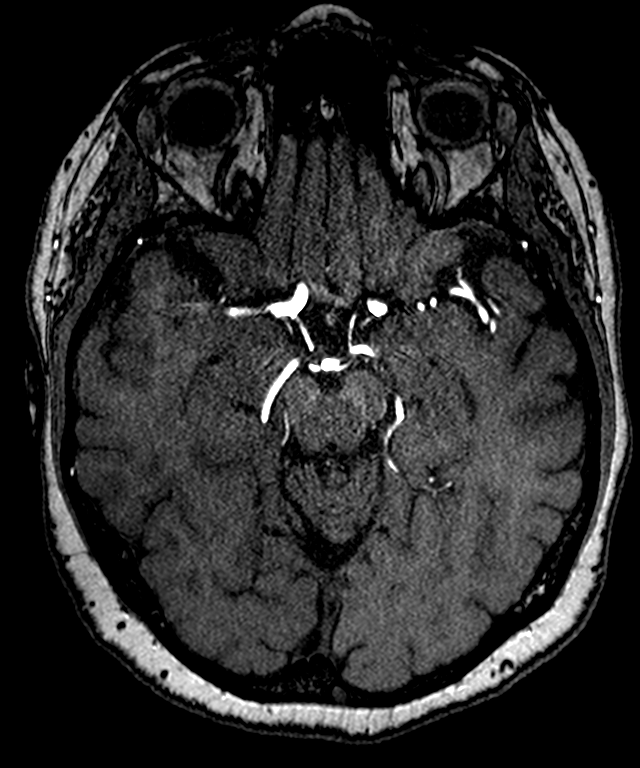
[im 112/160]
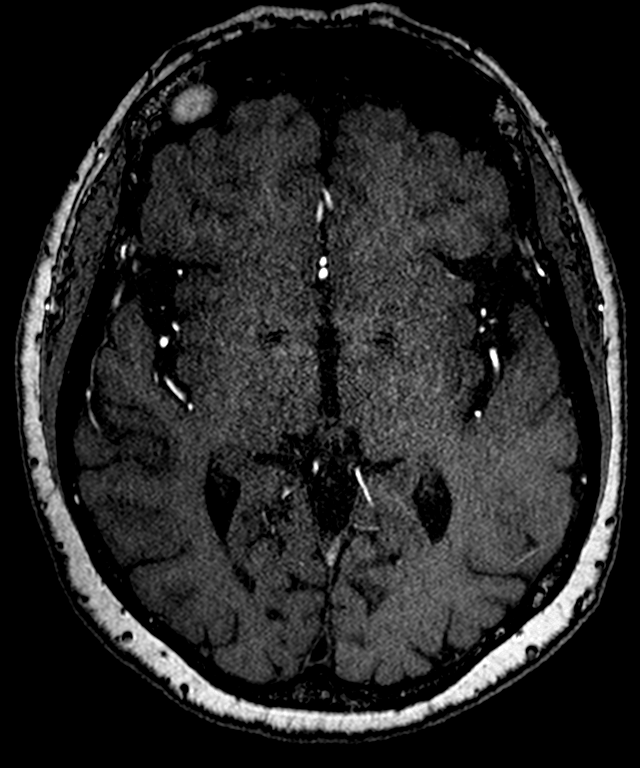
[im 132/160]
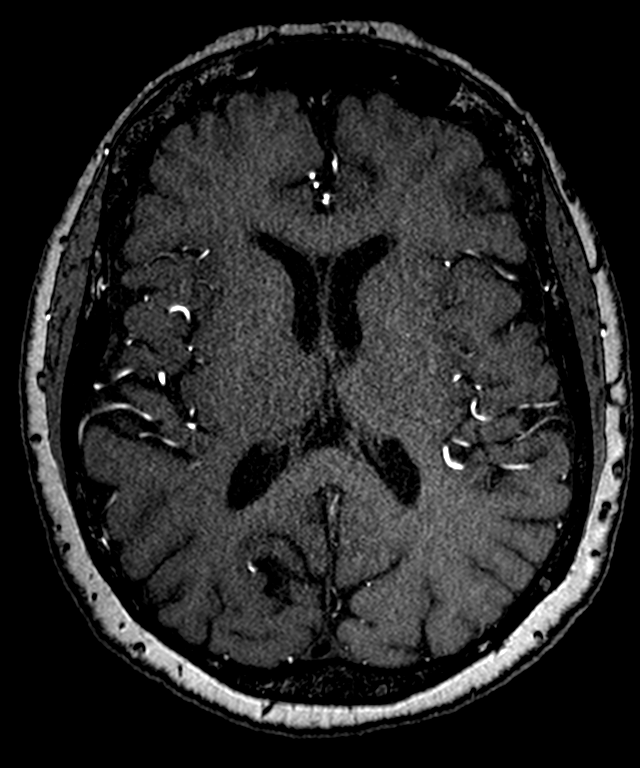
[im 136/160]
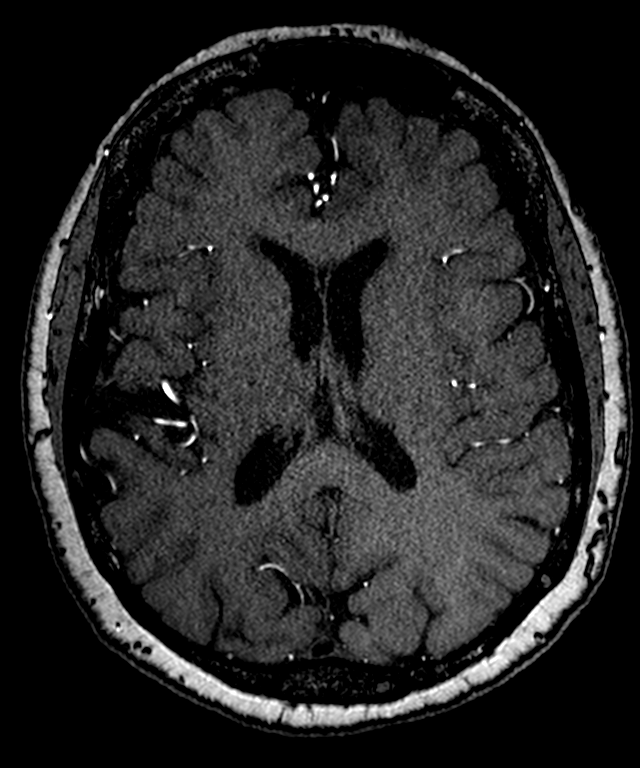
[im 153/160]
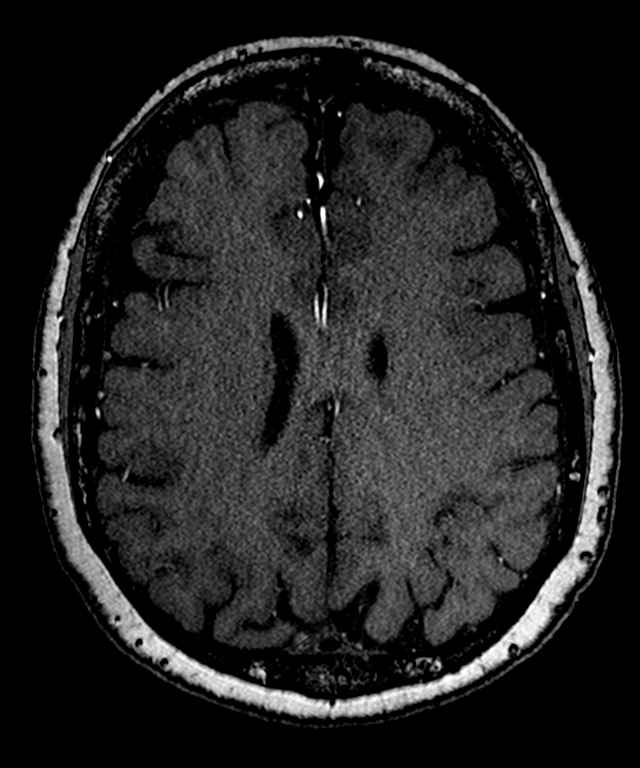

[11 of 48 positions shown; findings below may reference images not displayed]

FINDINGS: Anterior circulation: The visualized portions of the distal cervical
and intracranial internal carotid arteries are widely patent with
normal flow related enhancement. The bilateral anterior cerebral
arteries and middle cerebral arteries are widely patent with
anterograde flow without high-grade flow-limiting stenosis or
proximal branch occlusion. No intracranial aneurysm within the
anterior circulation.

Posterior circulation: The vertebral arteries are widely patent with
anterograde flow. Vertebrobasilar junction and basilar artery are
widely patent with anterograde flow without evidence of basilar
stenosis or aneurysm. Posterior cerebral arteries are normal
bilaterally, noting a fenestration at the right P1-P2 junction
(anatomical variant). No intracranial aneurysm within the posterior
circulation.

Anatomic variants: Fenestration at the right P1-P2/PCA.

Other: None.
IMPRESSION: Unremarkable MR angiogram of the brain. Image described on prior MR
angiogram of the neck suspicious for a left MCA aneurysm is not
demonstrated in the current study and likely corresponded to
artifact from venous contamination.

## 2021-11-13 ENCOUNTER — Ambulatory Visit: Payer: Medicare Other | Admitting: Allergy

## 2021-12-13 ENCOUNTER — Other Ambulatory Visit: Payer: Self-pay | Admitting: Family Medicine

## 2021-12-15 ENCOUNTER — Other Ambulatory Visit: Payer: Medicare Other

## 2021-12-21 ENCOUNTER — Other Ambulatory Visit: Payer: Self-pay | Admitting: Family Medicine

## 2021-12-22 DIAGNOSIS — M9903 Segmental and somatic dysfunction of lumbar region: Secondary | ICD-10-CM | POA: Diagnosis not present

## 2021-12-22 DIAGNOSIS — M9905 Segmental and somatic dysfunction of pelvic region: Secondary | ICD-10-CM | POA: Diagnosis not present

## 2021-12-22 DIAGNOSIS — M955 Acquired deformity of pelvis: Secondary | ICD-10-CM | POA: Diagnosis not present

## 2021-12-22 DIAGNOSIS — M5442 Lumbago with sciatica, left side: Secondary | ICD-10-CM | POA: Diagnosis not present

## 2021-12-26 ENCOUNTER — Other Ambulatory Visit: Payer: Self-pay | Admitting: Internal Medicine

## 2022-01-20 DIAGNOSIS — M9903 Segmental and somatic dysfunction of lumbar region: Secondary | ICD-10-CM | POA: Diagnosis not present

## 2022-01-20 DIAGNOSIS — M955 Acquired deformity of pelvis: Secondary | ICD-10-CM | POA: Diagnosis not present

## 2022-01-20 DIAGNOSIS — M5442 Lumbago with sciatica, left side: Secondary | ICD-10-CM | POA: Diagnosis not present

## 2022-01-20 DIAGNOSIS — M9905 Segmental and somatic dysfunction of pelvic region: Secondary | ICD-10-CM | POA: Diagnosis not present

## 2022-02-11 ENCOUNTER — Ambulatory Visit
Admission: RE | Admit: 2022-02-11 | Discharge: 2022-02-11 | Disposition: A | Discharge status: Home / Self Care | Payer: Medicare Other | Source: Ambulatory Visit | Attending: Family Medicine | Admitting: Family Medicine

## 2022-02-11 DIAGNOSIS — Z780 Asymptomatic menopausal state: Secondary | ICD-10-CM | POA: Insufficient documentation

## 2022-02-11 DIAGNOSIS — M8589 Other specified disorders of bone density and structure, multiple sites: Secondary | ICD-10-CM | POA: Diagnosis not present

## 2022-02-11 DIAGNOSIS — Z1231 Encounter for screening mammogram for malignant neoplasm of breast: Secondary | ICD-10-CM | POA: Diagnosis not present

## 2022-02-12 ENCOUNTER — Other Ambulatory Visit: Payer: Self-pay | Admitting: Internal Medicine

## 2022-02-16 ENCOUNTER — Other Ambulatory Visit (HOSPITAL_COMMUNITY): Payer: Self-pay

## 2022-02-18 ENCOUNTER — Ambulatory Visit (INDEPENDENT_AMBULATORY_CARE_PROVIDER_SITE_OTHER): Payer: Medicare Other | Admitting: Family Medicine

## 2022-02-18 ENCOUNTER — Encounter: Payer: Self-pay | Admitting: Family Medicine

## 2022-02-18 VITALS — BP 126/60 | HR 64 | Temp 99.1°F | Ht 63.5 in | Wt 196.1 lb

## 2022-02-18 DIAGNOSIS — Z23 Encounter for immunization: Secondary | ICD-10-CM

## 2022-02-18 DIAGNOSIS — M810 Age-related osteoporosis without current pathological fracture: Secondary | ICD-10-CM

## 2022-02-18 MED ORDER — ALENDRONATE SODIUM 70 MG PO TABS: 70.0000 mg | ORAL_TABLET | ORAL | 11 refills | Status: DC

## 2022-02-18 NOTE — Patient Instructions (Addendum)
Try to decrease prevacid to 15 mg ONCE daily try to wean off. Recommend weight bearing exercise, calcium in diet and vit D supplement 400 IU 1-2 times daily. Consider starting alendronate for 4 year. Repeat bone density in 2 years.

## 2022-02-18 NOTE — Progress Notes (Signed)
Patient ID: [name], female    DOB: 13-Jan-1951, 71 y.o.   MRN: [id number]  This visit was conducted in person.  BP 126/60   Pulse 64   Temp 99.1 F (37.3 C) (Oral)   Ht 5' 3.5" (1.613 m)   Wt 196 lb 2 oz (89 kg)   SpO2 95%   BMI 34.20 kg/m    CC:  Chief Complaint  Patient presents with   Osteoporosis    Discuss Bone Density Results    Subjective:   HPI: [name] is a 71 y.o. female presenting on 02/18/2022 for Osteoporosis (Discuss Bone Density Results)  Osteoporosis, new diagnosis February 11, 2022 Spine T score -1.5 osteopenia, right femur neck osteoporosis -2.5 (down from -2.2 in 2021) Total femur T score -1.9.    She has started Ca and Vot D.  She has started weight bearing exercise.   She is on PPI prevacid 15 mg twice daily.  Relevant past medical, surgical, family and social history reviewed and updated as indicated. Interim medical history since our last visit reviewed. Allergies and medications reviewed and updated. Outpatient Medications Prior to Visit  Medication Sig Dispense Refill   albuterol (VENTOLIN HFA) 108 (90 Base) MCG/ACT inhaler Inhale 2 puffs into the lungs every 6 (six) hours as needed for wheezing or shortness of breath. 8 g 2   atorvastatin (LIPITOR) 10 MG tablet TAKE 1 TABLET(10 MG) BY MOUTH DAILY 90 tablet 3   dicyclomine (BENTYL) 10 MG capsule TAKE 1 CAPSULE(10 MG) BY MOUTH EVERY 8 HOURS AS NEEDED FOR SPASMS OR ABDOMINAL PAIN 90 capsule 0   famotidine (PEPCID) 20 MG tablet Take 1 tablet (20 mg total) by mouth 2 (two) times daily. 180 tablet 1   fexofenadine (ALLERGY RELIEF) 180 MG tablet TAKE 1 TABLET(180 MG) BY MOUTH DAILY 30 tablet 4   fluticasone (FLOVENT HFA) 110 MCG/ACT inhaler Inhale 2 puffs into the lungs 2 (two) times daily. 1 each 5   imiquimod (ALDARA) 5 % cream Apply topically 3 (three) times a week. Apply to right vulva 3 times a week, use for 12 weeks 12 each 1   lansoprazole (PREVACID) 15 MG capsule Take 1 capsule  (15 mg total) by mouth 2 (two) times daily before a meal. 180 capsule 1   losartan-hydrochlorothiazide (HYZAAR) 100-25 MG tablet TAKE 1 TABLET BY MOUTH EVERY DAY 90 tablet 3   montelukast (SINGULAIR) 10 MG tablet TAKE 1 TABLET(10 MG) BY MOUTH EVERY MORNING 90 tablet 3   nystatin (MYCOSTATIN/NYSTOP) powder Apply 1 Application topically 2 (two) times daily.     metoprolol tartrate (LOPRESSOR) 25 MG tablet TAKE 1/2 TABLET(12.5 MG) BY MOUTH TWICE DAILY 90 tablet 0   promethazine-dextromethorphan (PROMETHAZINE-DM) 6.25-15 MG/5ML syrup Take 5 mLs by mouth every 4 (four) hours as needed.     No facility-administered medications prior to visit.     Per HPI unless specifically indicated in ROS section below Review of Systems  Constitutional:  Negative for fatigue and fever.  HENT:  Negative for ear pain.   Eyes:  Negative for pain.  Respiratory:  Negative for chest tightness and shortness of breath.   Cardiovascular:  Negative for chest pain, palpitations and leg swelling.  Gastrointestinal:  Negative for abdominal pain.  Genitourinary:  Negative for dysuria.   Objective:  BP 126/60   Pulse 64   Temp 99.1 F (37.3 C) (Oral)   Ht 5' 3.5" (1.613 m)   Wt 196 lb 2 oz (89 kg)  SpO2 95%   BMI 34.20 kg/m   Wt Readings from Last 3 Encounters:  02/18/22 196 lb 2 oz (89 kg)  10/15/21 197 lb 3.2 oz (89.4 kg)  09/17/21 195 lb 2 oz (88.5 kg)      Physical Exam Constitutional:      General: She is not in acute distress.    Appearance: Normal appearance. She is well-developed. She is not ill-appearing or toxic-appearing.  HENT:     Head: Normocephalic.     Right Ear: Hearing, tympanic membrane, ear canal and external ear normal. Tympanic membrane is not erythematous, retracted or bulging.     Left Ear: Hearing, tympanic membrane, ear canal and external ear normal. Tympanic membrane is not erythematous, retracted or bulging.     Nose: No mucosal edema or rhinorrhea.     Right Sinus: No maxillary  sinus tenderness or frontal sinus tenderness.     Left Sinus: No maxillary sinus tenderness or frontal sinus tenderness.     Mouth/Throat:     Pharynx: Uvula midline.  Eyes:     General: Lids are normal. Lids are everted, no foreign bodies appreciated.     Conjunctiva/sclera: Conjunctivae normal.     Pupils: Pupils are equal, round, and reactive to light.  Neck:     Thyroid: No thyroid mass or thyromegaly.     Vascular: No carotid bruit.     Trachea: Trachea normal.  Cardiovascular:     Rate and Rhythm: Normal rate and regular rhythm.     Pulses: Normal pulses.     Heart sounds: Normal heart sounds, S1 normal and S2 normal. No murmur heard.    No friction rub. No gallop.  Pulmonary:     Effort: Pulmonary effort is normal. No tachypnea or respiratory distress.     Breath sounds: Normal breath sounds. No decreased breath sounds, wheezing, rhonchi or rales.  Abdominal:     General: Bowel sounds are normal.     Palpations: Abdomen is soft.     Tenderness: There is no abdominal tenderness.  Musculoskeletal:     Cervical back: Normal range of motion and neck supple.  Skin:    General: Skin is warm and dry.     Findings: No rash.  Neurological:     Mental Status: She is alert.  Psychiatric:        Mood and Affect: Mood is not anxious or depressed.        Speech: Speech normal.        Behavior: Behavior normal. Behavior is cooperative.        Thought Content: Thought content normal.        Judgment: Judgment normal.       Results for orders placed or performed in visit on 10/06/21  Lipid panel  Result Value Ref Range   Cholesterol 175 0 - 200 mg/dL   Triglycerides 137.0 0.0 - 149.0 mg/dL   HDL 64.50 >39.00 mg/dL   VLDL 27.4 0.0 - 40.0 mg/dL   LDL Cholesterol 83 0 - 99 mg/dL   Total CHOL/HDL Ratio 3    NonHDL 110.87   Comprehensive metabolic panel  Result Value Ref Range   Sodium 139 135 - 145 mEq/L   Potassium 3.6 3.5 - 5.1 mEq/L   Chloride 100 96 - 112 mEq/L   CO2 31  19 - 32 mEq/L   Glucose, Bld 92 70 - 99 mg/dL   BUN 12 6 - 23 mg/dL   Creatinine, Ser 1.05 0.40 - 1.20  mg/dL   Total Bilirubin 0.5 0.2 - 1.2 mg/dL   Alkaline Phosphatase 58 39 - 117 U/L   AST 18 0 - 37 U/L   ALT 17 0 - 35 U/L   Total Protein 7.2 6.0 - 8.3 g/dL   Albumin 4.1 3.5 - 5.2 g/dL   GFR 54.51 (L) >60.00 mL/min   Calcium 9.4 8.4 - 10.5 mg/dL  CBC with Differential/Platelet  Result Value Ref Range   WBC 7.6 4.0 - 10.5 K/uL   RBC 4.11 3.87 - 5.11 Mil/uL   Hemoglobin 12.8 12.0 - 15.0 g/dL   HCT 38.2 36.0 - 46.0 %   MCV 93.0 78.0 - 100.0 fl   MCHC 33.4 30.0 - 36.0 g/dL   RDW 12.6 11.5 - 15.5 %   Platelets 289.0 150.0 - 400.0 K/uL   Neutrophils Relative % 51.8 43.0 - 77.0 %   Lymphocytes Relative 37.2 12.0 - 46.0 %   Monocytes Relative 7.4 3.0 - 12.0 %   Eosinophils Relative 2.7 0.0 - 5.0 %   Basophils Relative 0.9 0.0 - 3.0 %   Neutro Abs 3.9 1.4 - 7.7 K/uL   Lymphs Abs 2.8 0.7 - 4.0 K/uL   Monocytes Absolute 0.6 0.1 - 1.0 K/uL   Eosinophils Absolute 0.2 0.0 - 0.7 K/uL   Basophils Absolute 0.1 0.0 - 0.1 K/uL  IBC + Ferritin  Result Value Ref Range   Iron 79 42 - 145 ug/dL   Transferrin 211.0 (L) 212.0 - 360.0 mg/dL   Saturation Ratios 26.7 20.0 - 50.0 %   Ferritin 96.3 10.0 - 291.0 ng/mL   TIBC 295.4 250.0 - 450.0 mcg/dL     COVID 19 screen:  No recent travel or known exposure to COVID19 The patient denies respiratory symptoms of COVID 19 at this time. The importance of social distancing was discussed today.   Assessment and Plan    Problem List Items Addressed This Visit     Osteoporosis    Discussed options to treat in detail including possible side effects, length of treatment course.  Try to decrease prevacid to 15 mg ONCE daily try to wean off. Recommend weight bearing exercise, calcium in diet and vit D supplement 400 IU 1-2 times daily. Consider starting alendronate for 4 year. Repeat bone density in 2 years.      Relevant Medications    alendronate (FOSAMAX) 70 MG tablet   Other Visit Diagnoses     Need for influenza vaccination    -  Primary   Relevant Orders   Flu Vaccine QUAD High Dose(Fluad) (Completed)      Meds ordered this encounter  Medications   alendronate (FOSAMAX) 70 MG tablet    Sig: Take 1 tablet (70 mg total) by mouth every 7 (seven) days. Take with a full glass of water on an empty stomach.    Dispense:  4 tablet    Refill:  11     Eliezer Lofts, MD

## 2022-02-18 NOTE — Assessment & Plan Note (Signed)
Discussed options to treat in detail including possible side effects, length of treatment course.  Try to decrease prevacid to 15 mg ONCE daily try to wean off. Recommend weight bearing exercise, calcium in diet and vit D supplement 400 IU 1-2 times daily. Consider starting alendronate for 4 year. Repeat bone density in 2 years.

## 2022-02-22 ENCOUNTER — Other Ambulatory Visit (HOSPITAL_COMMUNITY): Payer: Self-pay

## 2022-02-22 ENCOUNTER — Telehealth: Payer: Self-pay

## 2022-02-22 NOTE — Telephone Encounter (Signed)
Patient Advocate Encounter   Received notification from [facility] that prior authorization is required for Dicyclomine HCl '10MG'$  capsules  Submitted: 02-22-2022 Key [id number]

## 2022-02-23 ENCOUNTER — Other Ambulatory Visit (HOSPITAL_COMMUNITY): Payer: Self-pay

## 2022-02-24 ENCOUNTER — Other Ambulatory Visit (HOSPITAL_COMMUNITY): Payer: Self-pay

## 2022-02-25 ENCOUNTER — Other Ambulatory Visit (HOSPITAL_COMMUNITY): Payer: Self-pay

## 2022-02-25 NOTE — Telephone Encounter (Signed)
[name] from [facility] called to confirm appeal was approved and PT can now have the Dicyclomine prescribed.

## 2022-02-26 ENCOUNTER — Other Ambulatory Visit (HOSPITAL_COMMUNITY): Payer: Self-pay

## 2022-02-26 NOTE — Telephone Encounter (Signed)
Appeal has been approved and can now be filled at pharmacy level

## 2022-03-10 NOTE — Patient Instructions (Incomplete)
Asthma Continue montelukast 10 mg once a day to prevent cough or wheeze During respiratory illness or flares start Flovent 110 mcg to 2 puffs twice a day for 1-2 weeks with spacer and rinse mouth out after Continue albuterol 2 puffs once every 4 hours as needed for cough or wheeze You may use albuterol 2 puffs 5 to 15 minutes before activity to decrease cough or wheeze  Asthma control goals:  Full participation in all desired activities (may need albuterol before activity) Albuterol use two time or less a week on average (not counting use with activity) Cough interfering with sleep two time or less a month Oral steroids no more than once a year No hospitalizations   Allergic rhinitis Continue allergen avoidance measures directed toward pollens, cat, and mold as listed below Continue Allegra 180 mg once a day as needed for runny nose or itch stop azelastine nasal spray Start ipratropium bromide nasal spray using 1-2 sprays in each nostril twice a day as needed for drainage down throat/runny nose. Caution as this can be drying Continue Flonase 2 sprays in each nostril once a day as needed for stuffy nose. In the right nostril, point the applicator out toward the right ear. In the left nostril, point the applicator out toward the left ear Consider saline nasal rinses as needed for nasal symptoms. Use this before any medicated nasal sprays for best result  Reflux Continue dietary and lifestyle modifications as listed below Continue lansoprazole 15 mg twice a day and famotidine 20 mg twice a day  Call the clinic if this treatment plan is not working well for you. Please bring all your medications with you to your next appointment Follow up in 2-3 months or sooner if needed.

## 2022-03-11 ENCOUNTER — Ambulatory Visit: Payer: Medicare Other | Admitting: Family

## 2022-03-11 ENCOUNTER — Other Ambulatory Visit: Payer: Self-pay

## 2022-03-11 ENCOUNTER — Encounter: Payer: Self-pay | Admitting: Family

## 2022-03-11 VITALS — BP 112/70 | HR 95 | Temp 97.9°F | Resp 16

## 2022-03-11 DIAGNOSIS — J3089 Other allergic rhinitis: Secondary | ICD-10-CM | POA: Diagnosis not present

## 2022-03-11 DIAGNOSIS — Z8616 Personal history of COVID-19: Secondary | ICD-10-CM | POA: Diagnosis not present

## 2022-03-11 DIAGNOSIS — J302 Other seasonal allergic rhinitis: Secondary | ICD-10-CM

## 2022-03-11 DIAGNOSIS — J4540 Moderate persistent asthma, uncomplicated: Secondary | ICD-10-CM

## 2022-03-11 MED ORDER — ALBUTEROL SULFATE HFA 108 (90 BASE) MCG/ACT IN AERS: 2.0000 | INHALATION_SPRAY | Freq: Four times a day (QID) | RESPIRATORY_TRACT | 1 refills | Status: DC | PRN

## 2022-03-11 MED ORDER — FLUTICASONE PROPIONATE HFA 110 MCG/ACT IN AERO: INHALATION_SPRAY | RESPIRATORY_TRACT | 2 refills | Status: DC

## 2022-03-11 MED ORDER — IPRATROPIUM BROMIDE 0.03 % NA SOLN: NASAL | 2 refills | Status: DC

## 2022-03-11 NOTE — Progress Notes (Addendum)
[place] [id number] Dept: [phone]  FOLLOW UP NOTE  Patient ID: [name], female    DOB: 1950-08-10  Age: 71 y.o. MRN: [id number] Date of Office Visit: 03/11/2022  Assessment  Chief Complaint: Asthma  HPI [name] is a 71 year old female who presents today for follow-up of moderate persistent asthma, seasonal and perennial allergic rhinitis, gastroesophageal reflux disease, and history of COVID-19.  She was last seen on December 9 9, 2022 by Dr. Nelva Bush.  She reports since her last office visit she has been diagnosed with osteoporosis.  Moderate persistent asthma: She is currently taking montelukast 10 mg once a day she has not been using Flovent 110 mcg for a while.  She reports a cough with a little bit of clear phlegm due to drainage.  Her wheezing also eased up a few weeks ago.  She denies tightness in chest, shortness of breath, and nocturnal awakenings due to breathing problems.  Since her last office visit she has not required any systemic steroids or made any trips to the emergency room or urgent care due to breathing problems.  She has not used her albuterol in quite a while.  Allergic rhinitis: She is currently taking Allegra 180 mg once a day and uses Flonase nasal spray as needed.  She thinks that azelastine nasal spray made her hoarse.  She is currently not using saline nasal rinses.  She reports rhinorrhea that is clear.  She will blow her nose every morning.  She also reports postnasal drip.  She denies nasal congestion.  She has not had any sinus infections since we last saw her.  Reflux is reported as controlled as long as she remembers to take her lansoprazole 15 mg twice a day and famotidine 20 mg twice a day.  She does mention if she misses 2 doses she will have reflux symptoms.   Drug Allergies:  Allergies  Allergen Reactions   Ibuprofen Hypertension   Naprosyn [Naproxen] Nausea Only   Other     Allergic to toothpaste- makes mouth  peel Hay fever/dust mites/trees "365 allergic"    Sevoflurane Nausea And Vomiting   Azithromycin Itching and Rash   Sulfa Antibiotics Hives and Rash    Review of Systems: Review of Systems  Constitutional:  Positive for fever. Negative for chills.       Reports low grade fevers of 99.2 or 99.3, but not now.  HENT:         Reports clear rhinorrhea every morning and postnasal drip.  Denies nasal congestion  Eyes:        Denies itchy watery eyes  Respiratory:  Positive for cough. Negative for shortness of breath and wheezing.        Reports cough due to postnasal drip.  Denies wheezing, tightness in chest, shortness of breath, and nocturnal awakenings due to breathing problems  Cardiovascular:  Negative for chest pain and palpitations.  Gastrointestinal:        Reports reflux is controlled as long as she takes her medications  Genitourinary:  Negative for frequency.  Skin:  Positive for itching and rash.       Reports an itchy rash that is gotten better that is between her breast.  Her dermatologist has given her nystatin cream and powder  Neurological:  Negative for headaches.  Endo/Heme/Allergies:  Positive for environmental allergies.     Physical Exam: BP 112/70   Pulse 95   Temp 97.9 F (36.6 C) (Temporal)  Resp 16   SpO2 95%    Physical Exam Constitutional:      Appearance: Normal appearance.  HENT:     Head: Normocephalic and atraumatic.     Comments: Pharynx normal, eyes normal, ears normal, nose: Bilateral lower turbinates moderate edematous with no drainage noted    Right Ear: Tympanic membrane, ear canal and external ear normal.     Left Ear: Tympanic membrane, ear canal and external ear normal.     Mouth/Throat:     Mouth: Mucous membranes are moist.     Pharynx: Oropharynx is clear.  Eyes:     Conjunctiva/sclera: Conjunctivae normal.  Cardiovascular:     Rate and Rhythm: Regular rhythm. Tachycardia present.     Heart sounds: Normal heart sounds.   Pulmonary:     Effort: Pulmonary effort is normal.     Breath sounds: Normal breath sounds.     Comments: Lungs clear to auscultation Musculoskeletal:     Cervical back: Neck supple.  Skin:    General: Skin is warm.  Neurological:     Mental Status: She is alert and oriented to person, place, and time.  Psychiatric:        Mood and Affect: Mood normal.        Behavior: Behavior normal.        Thought Content: Thought content normal.        Judgment: Judgment normal.     Diagnostics: FVC 2.37 L (82%), FEV1 2.00 L (90%).  Predicted FVC 2.88 L, predicted FEV1 2.23 L.  Spirometry indicates normal ventilatory function.  Assessment and Plan: 1. Moderate persistent asthma without complication   2. Seasonal and perennial allergic rhinitis   3. History of COVID-19     Meds ordered this encounter  Medications   ipratropium (ATROVENT) 0.03 % nasal spray    Sig: 1 to 2 sprays in each nostril twice a day as needed for runny nose/drainage down throat    Dispense:  30 mL    Refill:  2   albuterol (VENTOLIN HFA) 108 (90 Base) MCG/ACT inhaler    Sig: Inhale 2 puffs into the lungs every 6 (six) hours as needed for wheezing or shortness of breath.    Dispense:  8 g    Refill:  1   fluticasone (FLOVENT HFA) 110 MCG/ACT inhaler    Sig: During upper respiratory infection/asthma flares start Flovent 110 mcg using 2 puffs twice a day with spacer for 1 to 2 weeks.  Rinse mouth out afterwards    Dispense:  1 each    Refill:  2    Patient Instructions  Asthma Continue montelukast 10 mg once a day to prevent cough or wheeze During respiratory illness or flares start Flovent 110 mcg to 2 puffs twice a day for 1-2 weeks with spacer and rinse mouth out after Continue albuterol 2 puffs once every 4 hours as needed for cough or wheeze You may use albuterol 2 puffs 5 to 15 minutes before activity to decrease cough or wheeze  Asthma control goals:  Full participation in all desired activities (may  need albuterol before activity) Albuterol use two time or less a week on average (not counting use with activity) Cough interfering with sleep two time or less a month Oral steroids no more than once a year No hospitalizations   Allergic rhinitis Continue allergen avoidance measures directed toward pollens, cat, and mold as listed below Continue Allegra 180 mg once a day as needed for runny nose  or itch stop azelastine nasal spray Start ipratropium bromide nasal spray using 1-2 sprays in each nostril twice a day as needed for drainage down throat/runny nose. Caution as this can be drying Continue Flonase 2 sprays in each nostril once a day as needed for stuffy nose. In the right nostril, point the applicator out toward the right ear. In the left nostril, point the applicator out toward the left ear Consider saline nasal rinses as needed for nasal symptoms. Use this before any medicated nasal sprays for best result  Reflux Continue dietary and lifestyle modifications as listed below Continue lansoprazole 15 mg twice a day and famotidine 20 mg twice a day  Call the clinic if this treatment plan is not working well for you. Please bring all your medications with you to your next appointment Follow up in 2-3 months or sooner if needed.  Return in about 2 months (around 05/11/2022), or if symptoms worsen or fail to improve.    Thank you for the opportunity to care for this patient.  Please do not hesitate to contact me with questions.  Althea Charon, FNP [facility]

## 2022-03-12 ENCOUNTER — Telehealth: Payer: Self-pay | Admitting: Internal Medicine

## 2022-03-12 ENCOUNTER — Other Ambulatory Visit: Payer: Self-pay | Admitting: Internal Medicine

## 2022-03-12 ENCOUNTER — Other Ambulatory Visit: Payer: Self-pay | Admitting: Family Medicine

## 2022-03-12 MED ORDER — DICYCLOMINE HCL 10 MG PO CAPS: ORAL_CAPSULE | ORAL | 0 refills | Status: DC

## 2022-03-12 NOTE — Addendum Note (Signed)
Addended by: Eloy End D on: 03/12/2022 05:35 PM   Modules accepted: Orders

## 2022-03-12 NOTE — Telephone Encounter (Signed)
Inbound call from patient regarding medication "dicyclomine". Stated she is been waiting for prescription for over a month, please call patient once its done.   Thanks

## 2022-03-12 NOTE — Telephone Encounter (Signed)
I called and told [name] that the dicyclomine was approved after the appeal was done in late September. I have resent the rx to [facility] in [place] per her request.

## 2022-05-21 ENCOUNTER — Ambulatory Visit (INDEPENDENT_AMBULATORY_CARE_PROVIDER_SITE_OTHER): Payer: Medicare Other | Admitting: Internal Medicine

## 2022-05-21 ENCOUNTER — Encounter: Payer: Self-pay | Admitting: Internal Medicine

## 2022-05-21 VITALS — BP 130/80 | HR 81 | Temp 98.1°F | Ht 63.5 in | Wt 198.0 lb

## 2022-05-21 DIAGNOSIS — J0140 Acute pansinusitis, unspecified: Secondary | ICD-10-CM | POA: Diagnosis not present

## 2022-05-21 MED ORDER — AMOXICILLIN-POT CLAVULANATE 875-125 MG PO TABS: 1.0000 | ORAL_TABLET | Freq: Two times a day (BID) | ORAL | 0 refills | Status: DC

## 2022-05-21 NOTE — Progress Notes (Signed)
Subjective:    Patient ID: [name], female    DOB: 24-Jan-1951, 71 y.o.   MRN: [id number]  HPI Here due to congestion  Sinuses are congested No facial pressure but bad mucus in nose---gets scabs in nose (green and yellow stuff) Now draining into throat Some bloody nasal drainage---better now First started 2 weeks ago  No fever Now with more cough--from drainage? No ear pain No headache No SOB  Current Outpatient Medications on File Prior to Visit  Medication Sig Dispense Refill   albuterol (VENTOLIN HFA) 108 (90 Base) MCG/ACT inhaler Inhale 2 puffs into the lungs every 6 (six) hours as needed for wheezing or shortness of breath. 8 g 1   alendronate (FOSAMAX) 70 MG tablet Take 1 tablet (70 mg total) by mouth every 7 (seven) days. Take with a full glass of water on an empty stomach. 4 tablet 11   ALLERGY RELIEF 180 MG tablet TAKE 1 TABLET(180 MG) BY MOUTH DAILY 30 tablet 5   atorvastatin (LIPITOR) 10 MG tablet TAKE 1 TABLET(10 MG) BY MOUTH DAILY 90 tablet 3   dicyclomine (BENTYL) 10 MG capsule TAKE 1 CAPSULE(10 MG) BY MOUTH EVERY 8 HOURS AS NEEDED FOR SPASMS OR ABDOMINAL PAIN 270 capsule 0   famotidine (PEPCID) 20 MG tablet Take 1 tablet (20 mg total) by mouth 2 (two) times daily. 180 tablet 1   fluticasone (FLOVENT HFA) 110 MCG/ACT inhaler During upper respiratory infection/asthma flares start Flovent 110 mcg using 2 puffs twice a day with spacer for 1 to 2 weeks.  Rinse mouth out afterwards 1 each 2   imiquimod (ALDARA) 5 % cream Apply topically 3 (three) times a week. Apply to right vulva 3 times a week, use for 12 weeks 12 each 1   ipratropium (ATROVENT) 0.03 % nasal spray 1 to 2 sprays in each nostril twice a day as needed for runny nose/drainage down throat 30 mL 2   lansoprazole (PREVACID) 15 MG capsule Take 1 capsule (15 mg total) by mouth 2 (two) times daily before a meal. 180 capsule 1   losartan-hydrochlorothiazide (HYZAAR) 100-25 MG tablet TAKE 1 TABLET BY MOUTH EVERY  DAY 90 tablet 3   montelukast (SINGULAIR) 10 MG tablet TAKE 1 TABLET(10 MG) BY MOUTH EVERY MORNING 90 tablet 3   nystatin (MYCOSTATIN/NYSTOP) powder Apply 1 Application topically 2 (two) times daily.     nystatin cream (MYCOSTATIN) Apply topically 3 (three) times daily.     No current facility-administered medications on file prior to visit.    Allergies  Allergen Reactions   Ibuprofen Hypertension   Naprosyn [Naproxen] Nausea Only   Other     Allergic to toothpaste- makes mouth peel Hay fever/dust mites/trees "365 allergic"    Sevoflurane Nausea And Vomiting   Azithromycin Itching and Rash   Sulfa Antibiotics Hives and Rash    Past Medical History:  Diagnosis Date   Allergic rhinitis    Arthritis    Asthma    mild no inaler use   GERD (gastroesophageal reflux disease)    Headache(784.0)    Migraines   History of adenomatous polyp of colon    History of concussion    AS CHILD--  NO RESIDUAL   History of palpitations    Hypertension    Paget's disease of vulva (HCC)    PONV (postoperative nausea and vomiting)    SEVERE   Wears glasses     Past Surgical History:  Procedure Laterality Date   mucoid cyst removal thumb  PULLERY RELEASE LEFT THUMB, LEFT RING FINGER/  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  02-17-2011   PULLEY RELEASE RIGHT THUMB  03-23-2011   SIMPLE VULVECTOMY  03-24-2010   right side   TONSILLECTOMY  1977   TRIGGER FINGER RELEASE     VAGINAL HYSTERECTOMY  1989   and Anterior and posterior repair's for prolapse partial   VULVECTOMY Right 01/10/2015   Procedure: RIGHT WIDE LOCAL EXCISION VULVECTOMY;  Surgeon: [name], MD;  Location: [facility];  Service: Gynecology;  Laterality: Right;   VULVECTOMY Right 04/10/2015   Procedure: WIDE LOCAL EXCISION VULVA;  Surgeon: [name], MD;  Location: [facility];  Service: Gynecology;  Laterality: Right;   VULVECTOMY PARTIAL N/A  11/07/2018   Procedure: VULVECTOMY PARTIAL;  Surgeon: [name], MD;  Location: [facility];  Service: Gynecology;  Laterality: N/A;   WISDOM TOOTH EXTRACTION      Family History  Problem Relation Age of Onset   Cancer Maternal Grandmother    Hypertension Father    Heart disease Father    Asthma Father    Hypertension Mother    Heart disease Mother    Breast cancer Paternal Grandmother 17   Colon cancer Paternal Grandmother    Colon cancer Paternal Uncle    Allergic rhinitis Neg Hx    Angioedema Neg Hx    Eczema Neg Hx    Immunodeficiency Neg Hx    Urticaria Neg Hx    Esophageal cancer Neg Hx    Rectal cancer Neg Hx    Stomach cancer Neg Hx     Social History   Socioeconomic History   Marital status: Married    Spouse name: Not on file   Number of children: 3   Years of education: Not on file   Highest education level: Not on file  Occupational History   Occupation: retired  Tobacco Use   Smoking status: Former    Years: 5.00    Types: Cigarettes    Quit date: 04/15/1989    Years since quitting: 33.1   Smokeless tobacco: Never  Vaping Use   Vaping Use: Never used  Substance and Sexual Activity   Alcohol use: Not Currently    Alcohol/week: 1.0 standard drink of alcohol    Types: 1 Glasses of wine per week    Comment: occasional wine   Drug use: No   Sexual activity: Not on file  Other Topics Concern   Not on file  Social History Narrative   She is married.  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   Occasional wine, former smoker not now no substances   3 kids plus two step daughters   Social Determinants of Health   Financial Resource Strain: Low Risk  (06/19/2021)   Overall Financial Resource Strain (CARDIA)    Difficulty of Paying Living Expenses: Not hard at all  Food Insecurity: No Food Insecurity (06/19/2021)   Hunger Vital Sign    Worried About Running Out of Food in the Last Year: Never true    [place] in the Last Year:  Never true  Transportation Needs: No Transportation Needs (06/19/2021)   PRAPARE - Hydrologist (Medical): No    Lack of Transportation (Non-Medical): No  Physical Activity: Inactive (06/19/2021)   Exercise Vital Sign    Days of Exercise per Week: 0 days    Minutes of Exercise per Session: 0 min  Stress: No  Stress Concern Present (06/19/2021)   [place]    Feeling of Stress : Not at all  Social Connections: [place] (06/19/2021)   Social Connection and Isolation Panel [NHANES]    Frequency of Communication with Friends and Family: Twice a week    Frequency of Social Gatherings with Friends and Family: Once a week    Attends Religious Services: More than 4 times per year    Active Member of [employer] or Organizations: Yes    Attends Music therapist: More than 4 times per year    Marital Status: Married  Human resources officer Violence: Not At Risk (06/19/2021)   Humiliation, Afraid, Rape, and Kick questionnaire    Fear of Current or Ex-Partner: No    Emotionally Abused: No    Physically Abused: No    Sexually Abused: No   Review of Systems No NV Eating okay Is using nasal sprays--but not the inhalers     Objective:   Physical Exam Constitutional:      Appearance: Normal appearance.  HENT:     Head:     Comments: No sinus tenderness    Right Ear: Tympanic membrane and ear canal normal.     Left Ear: Tympanic membrane and ear canal normal.     Nose: Congestion present.     Comments: Bleeding spot on right septum (discussed using neosporin/vaseline on this)    Mouth/Throat:     Pharynx: No oropharyngeal exudate or posterior oropharyngeal erythema.  Pulmonary:     Effort: Pulmonary effort is normal.     Breath sounds: Normal breath sounds. No wheezing or rales.  Musculoskeletal:     Cervical back: Neck supple.  Lymphadenopathy:     Cervical: No cervical adenopathy.   Neurological:     Mental Status: She is alert.            Assessment & Plan:

## 2022-05-21 NOTE — Assessment & Plan Note (Signed)
2 weeks of symptoms and not improving Hold on nasal steroid for a few days (to help the bleeding) Tylenol and OTC cough meds Augmentin 875 bid x 7 days

## 2022-05-26 ENCOUNTER — Telehealth: Payer: Self-pay | Admitting: Family Medicine

## 2022-05-26 DIAGNOSIS — R051 Acute cough: Secondary | ICD-10-CM | POA: Diagnosis not present

## 2022-05-26 DIAGNOSIS — J101 Influenza due to other identified influenza virus with other respiratory manifestations: Secondary | ICD-10-CM | POA: Diagnosis not present

## 2022-05-26 NOTE — Telephone Encounter (Signed)
Spoke with pt.  She plans to go to [facility] to be seen today.     [facility] - Client TELEPHONE ADVICE RECORD [facility] Patient Name: [name] Gender: Female DOB: 09/25/1950 Age: 71 Y 75 M 12 D Return Phone Number: [phone] (Primary) Address: City/ State/ Zip: [place] [address] Client [facility] - Client Client Site [place] - Day Provider Eliezer Lofts - MD Contact Type Call Who Is Calling Patient / Member / Family / Caregiver Call Type Triage / Clinical Relationship To Patient Self Return Phone Number [phone] (Primary) Chief Complaint WHEEZING Reason for Call Symptomatic / Request for [place] states pt states they are having coughing and wheezing and thinks it went down into their chest. They have no appts today or tomorrow. Translation No Nurse Assessment Nurse: Zenia Resides, RN, Diane Date/Time Eilene Ghazi Time): 05/26/2022 1:59:22 PM Confirm and document reason for call. If symptomatic, describe symptoms. ---Caller states she has a productive cough with yellow/clear phlegm. Started last week and was dx with sinus infection and given Augmentin. Has two days left of the abx. No pain in chest. No fever. Pt. is having some wheezing. Hx of asthma. Feels like she is getting worse. Does the patient have any new or worsening symptoms? ---Yes Will a triage be completed? ---Yes Related visit to physician within the last 2 weeks? ---Yes Does the PT have any chronic conditions? (i.e. diabetes, asthma, this includes High risk factors for pregnancy, etc.) ---Yes List chronic conditions. ---asthma Is this a behavioral health or substance abuse call? ---No Guidelines Guideline Title Affirmed Question Affirmed Notes Nurse Date/Time (Eastern Time) Sinus Infection on Antibiotic Follow-up Call [1] Taking antibiotic > 72 hours (3 days) AND [2] sinus  pain not improved Zenia Resides, RN, Diane 05/26/2022 2:01:49 PM PLEASE NOTE: All timestamps contained within this report are represented as [place] Standard Time. CONFIDENTIALTY NOTICE: This fax transmission is intended only for the addressee. It contains information that is legally privileged, confidential or otherwise protected from use or disclosure. If you are not the intended recipient, you are strictly prohibited from reviewing, disclosing, copying using or disseminating any of this information or taking any action in reliance on or regarding this information. If you have received this fax in error, please notify us immediately by telephone so that we can arrange for its return to [place]. Phone: [phone], Toll-Free: [phone], Fax: [phone] Page: 2 of 2 Call Id: [id number] [place]. Time Eilene Ghazi Time) Disposition Final User 05/26/2022 1:56:36 PM Send to Urgent Queue Socrates, Janett Billow 05/26/2022 2:04:53 PM See PCP within 24 Hours Yes Zenia Resides, RN, Diane Final Disposition 05/26/2022 2:04:53 PM See PCP within 24 Hours Yes Zenia Resides, RN, Diane Caller Disagree/Comply Comply Caller Understands Yes PreDisposition Did not know what to do Care Advice Given Per Guideline SEE PCP WITHIN 24 HOURS: * IF OFFICE WILL BE OPEN: You need to be examined within the next 24 hours. Call your doctor (or NP/PA) when the office opens and make an appointment. * Continue the prescribed antibiotic. CONTINUE ANTIBIOTIC: * Difficulty breathing (and not relieved by cleaning out nose) CALL BACK IF: Comments User: Hildred Priest, RN Date/Time (Eastern Time): 05/26/2022 2:05:30 PM Referred to UC as there are no appts. available Referrals GO TO FACILITY UNDECIDED

## 2022-05-26 NOTE — Telephone Encounter (Signed)
Noted  

## 2022-05-26 NOTE — Telephone Encounter (Signed)
Patient called and stated she has been coughing and wheezing and it has went down into her chest. Patient was sent to access nurse.

## 2022-05-30 ENCOUNTER — Other Ambulatory Visit: Payer: Self-pay | Admitting: Allergy

## 2022-06-17 ENCOUNTER — Ambulatory Visit: Payer: Medicare Other | Admitting: Allergy

## 2022-06-17 ENCOUNTER — Encounter: Payer: Self-pay | Admitting: Allergy

## 2022-06-17 ENCOUNTER — Other Ambulatory Visit: Payer: Self-pay

## 2022-06-17 VITALS — BP 132/88 | HR 64 | Temp 98.3°F | Resp 16 | Ht 63.5 in | Wt 196.2 lb

## 2022-06-17 DIAGNOSIS — K2100 Gastro-esophageal reflux disease with esophagitis, without bleeding: Secondary | ICD-10-CM

## 2022-06-17 DIAGNOSIS — J3089 Other allergic rhinitis: Secondary | ICD-10-CM

## 2022-06-17 DIAGNOSIS — J4540 Moderate persistent asthma, uncomplicated: Secondary | ICD-10-CM

## 2022-06-17 DIAGNOSIS — J302 Other seasonal allergic rhinitis: Secondary | ICD-10-CM

## 2022-06-17 MED ORDER — FLUTICASONE PROPIONATE HFA 110 MCG/ACT IN AERO: INHALATION_SPRAY | RESPIRATORY_TRACT | 1 refills | Status: DC

## 2022-06-17 MED ORDER — MONTELUKAST SODIUM 10 MG PO TABS: 10.0000 mg | ORAL_TABLET | Freq: Every day | ORAL | 3 refills | Status: DC

## 2022-06-17 MED ORDER — ALBUTEROL SULFATE HFA 108 (90 BASE) MCG/ACT IN AERS: 2.0000 | INHALATION_SPRAY | Freq: Four times a day (QID) | RESPIRATORY_TRACT | 1 refills | Status: DC | PRN

## 2022-06-17 NOTE — Progress Notes (Signed)
Follow-up Note  RE: [name] MRN: [id number] DOB: 11-17-1950 Date of Office Visit: 06/17/2022   History of present illness: [name] is a 72 y.o. female presenting today for follow-up of asthma and allergic rhinitis.  She was last seen in the office on 03/11/22 by myself.   She states she had a sinus infection first and saw her PCP for these symptoms and was treated with antibiotics.  She states she was advised to stop flonase use at thie time as she reports having sores and irritation of her nose from the drainage and blowing with the sinus infection. She was recommended to use nasal atrovent.  She then developed flu last month with low grade fever, coughing, wheezing, weakness, fatigue.  She went to her PCP and was recommended to use a cough syrup.  Does not report taking tamiflu at the time.   She states she did get her flu vaccine prior to having the flu and does believe her flu illness was mild in comparison to previous years when she had flu without having vaccine.   She does report a lingering cough still.  She did use her flovent when she had flu illness but did stop use when she felt better.  She did use albuterol with the illness.  For routine asthma maintenance she does take singulair daily.  She has not had any UC/ED visits or systemic steroid needs.  She states with the singulair and her allegra her allergy symptoms are pretty well controlled.  She also states her reflux is control with use of both lansoprazole and famotidine.      Review of systems: Review of Systems  Constitutional: Negative.   HENT: Negative.    Eyes: Negative.   Respiratory:  Positive for cough.   Cardiovascular: Negative.   Gastrointestinal: Negative.   Musculoskeletal: Negative.   Skin: Negative.   Allergic/Immunologic: Negative.   Neurological: Negative.      All other systems negative unless noted above in HPI  Past medical/social/surgical/family history have been reviewed and are  unchanged unless specifically indicated below.  No changes  Medication List: Current Outpatient Medications  Medication Sig Dispense Refill   albuterol (VENTOLIN HFA) 108 (90 Base) MCG/ACT inhaler Inhale 2 puffs into the lungs every 6 (six) hours as needed for wheezing or shortness of breath. 8 g 1   alendronate (FOSAMAX) 70 MG tablet Take 1 tablet (70 mg total) by mouth every 7 (seven) days. Take with a full glass of water on an empty stomach. 4 tablet 11   ALLERGY RELIEF 180 MG tablet TAKE 1 TABLET(180 MG) BY MOUTH DAILY 30 tablet 5   atorvastatin (LIPITOR) 10 MG tablet TAKE 1 TABLET(10 MG) BY MOUTH DAILY 90 tablet 3   dicyclomine (BENTYL) 10 MG capsule TAKE 1 CAPSULE(10 MG) BY MOUTH EVERY 8 HOURS AS NEEDED FOR SPASMS OR ABDOMINAL PAIN 270 capsule 0   famotidine (PEPCID) 20 MG tablet Take 1 tablet (20 mg total) by mouth 2 (two) times daily. 180 tablet 1   fluticasone (FLOVENT HFA) 110 MCG/ACT inhaler During upper respiratory infection/asthma flares start Flovent 110 mcg using 2 puffs twice a day with spacer for 1 to 2 weeks.  Rinse mouth out afterwards 1 each 2   imiquimod (ALDARA) 5 % cream Apply topically 3 (three) times a week. Apply to right vulva 3 times a week, use for 12 weeks 12 each 1   ipratropium (ATROVENT) 0.03 % nasal spray 1 to 2 sprays in each  nostril twice a day as needed for runny nose/drainage down throat 30 mL 2   lansoprazole (PREVACID) 15 MG capsule Take 1 capsule (15 mg total) by mouth 2 (two) times daily before a meal. 180 capsule 1   losartan-hydrochlorothiazide (HYZAAR) 100-25 MG tablet TAKE 1 TABLET BY MOUTH EVERY DAY 90 tablet 3   montelukast (SINGULAIR) 10 MG tablet TAKE 1 TABLET(10 MG) BY MOUTH EVERY MORNING 90 tablet 3   nystatin (MYCOSTATIN/NYSTOP) powder Apply 1 Application topically 2 (two) times daily.     nystatin cream (MYCOSTATIN) Apply topically 3 (three) times daily.     No current facility-administered medications for this visit.     Known medication  allergies: Allergies  Allergen Reactions   Ibuprofen Hypertension   Naprosyn [Naproxen] Nausea Only   Other     Allergic to toothpaste- makes mouth peel Hay fever/dust mites/trees "365 allergic"    Sevoflurane Nausea And Vomiting   Azithromycin Itching and Rash   Sulfa Antibiotics Hives and Rash     Physical examination: Blood pressure 132/88, pulse 64, temperature 98.3 F (36.8 C), resp. rate 16, height 5' 3.5" (1.613 m), weight 196 lb 3.2 oz (89 kg), SpO2 97 %.  General: Alert, interactive, in no acute distress. HEENT: PERRLA, TMs pearly gray, turbinates minimally edematous without discharge, post-pharynx non erythematous. Neck: Supple without lymphadenopathy. Lungs: Clear to auscultation without wheezing, rhonchi or rales. {no increased work of breathing. CV: Normal S1, S2 without murmurs. Abdomen: Nondistended, nontender. Skin: Warm and dry, without lesions or rashes. Extremities:  No clubbing, cyanosis or edema. Neuro:   Grossly intact.  Diagnositics/Labs:  Spirometry: FEV1: 2.03L 92%, FVC: 2.43L 84%, ratio consistent with nonobstructive pattern  Assessment and plan:   Asthma Continue montelukast 10 mg once a day to prevent cough or wheeze Resume Flovent 2 puffs twice a day at this time due to linger cough secondary to flu illness.  Can stop Flovent once cough resolves During respiratory illness or flares start Flovent 110 mcg to 2 puffs twice a day for 1-2 weeks with spacer and rinse mouth out after Continue albuterol 2 puffs once every 4 hours as needed for cough or wheeze You may use albuterol 2 puffs 5 to 15 minutes before activity to decrease cough or wheeze  Asthma control goals:  Full participation in all desired activities (may need albuterol before activity) Albuterol use two time or less a week on average (not counting use with activity) Cough interfering with sleep two time or less a month Oral steroids no more than once a year No  hospitalizations   Allergic rhinitis Continue allergen avoidance measures directed toward pollens, cat, and mold as listed below Continue Allegra 180 mg once a day as needed for runny nose or itch Continue Ipratropium bromide nasal spray using 1-2 sprays in each nostril 3-4 times a day as needed for drainage down throat/runny nose or congestion. Caution as this can be drying Hold Flonase for now Consider saline nasal rinses as needed for nasal symptoms. Use this before any medicated nasal sprays for best result  Reflux Continue dietary and lifestyle modifications as listed below Continue lansoprazole 15 mg twice a day and famotidine 20 mg twice a day  Follow up in 6 months or sooner if needed.  I appreciate the opportunity to take part in Latondra's care. Please do not hesitate to contact me with questions.  Sincerely,   Prudy Feeler, MD Allergy/Immunology [facility]

## 2022-06-17 NOTE — Patient Instructions (Addendum)
Asthma Continue montelukast 10 mg once a day to prevent cough or wheeze Resume Flovent 2 puffs twice a day at this time due to linger cough secondary to flu illness.  Can stop Flovent once cough resolves During respiratory illness or flares start Flovent 110 mcg to 2 puffs twice a day for 1-2 weeks with spacer and rinse mouth out after Continue albuterol 2 puffs once every 4 hours as needed for cough or wheeze You may use albuterol 2 puffs 5 to 15 minutes before activity to decrease cough or wheeze  Asthma control goals:  Full participation in all desired activities (may need albuterol before activity) Albuterol use two time or less a week on average (not counting use with activity) Cough interfering with sleep two time or less a month Oral steroids no more than once a year No hospitalizations   Allergic rhinitis Continue allergen avoidance measures directed toward pollens, cat, and mold as listed below Continue Allegra 180 mg once a day as needed for runny nose or itch Continue Ipratropium bromide nasal spray using 1-2 sprays in each nostril 3-4 times a day as needed for drainage down throat/runny nose or congestion. Caution as this can be drying Hold Flonase for now Consider saline nasal rinses as needed for nasal symptoms. Use this before any medicated nasal sprays for best result  Reflux Continue dietary and lifestyle modifications as listed below Continue lansoprazole 15 mg twice a day and famotidine 20 mg twice a day  Follow up in 6 months or sooner if needed.

## 2022-06-21 ENCOUNTER — Telehealth: Payer: Self-pay

## 2022-06-21 NOTE — Telephone Encounter (Signed)
No answer when called for scheduled AWV. Left message. Okay to reschedule.

## 2022-06-23 DIAGNOSIS — Z872 Personal history of diseases of the skin and subcutaneous tissue: Secondary | ICD-10-CM | POA: Diagnosis not present

## 2022-06-23 DIAGNOSIS — L538 Other specified erythematous conditions: Secondary | ICD-10-CM | POA: Diagnosis not present

## 2022-06-23 DIAGNOSIS — L570 Actinic keratosis: Secondary | ICD-10-CM | POA: Diagnosis not present

## 2022-06-23 DIAGNOSIS — Z09 Encounter for follow-up examination after completed treatment for conditions other than malignant neoplasm: Secondary | ICD-10-CM | POA: Diagnosis not present

## 2022-06-23 DIAGNOSIS — L448 Other specified papulosquamous disorders: Secondary | ICD-10-CM | POA: Diagnosis not present

## 2022-06-23 DIAGNOSIS — L820 Inflamed seborrheic keratosis: Secondary | ICD-10-CM | POA: Diagnosis not present

## 2022-06-23 DIAGNOSIS — L821 Other seborrheic keratosis: Secondary | ICD-10-CM | POA: Diagnosis not present

## 2022-07-01 ENCOUNTER — Encounter: Payer: Self-pay | Admitting: Family Medicine

## 2022-07-01 ENCOUNTER — Ambulatory Visit (INDEPENDENT_AMBULATORY_CARE_PROVIDER_SITE_OTHER): Payer: Medicare Other | Admitting: Family Medicine

## 2022-07-01 VITALS — BP 112/60 | HR 71 | Temp 97.7°F | Resp 16 | Ht 63.5 in | Wt 194.1 lb

## 2022-07-01 DIAGNOSIS — J4540 Moderate persistent asthma, uncomplicated: Secondary | ICD-10-CM

## 2022-07-01 DIAGNOSIS — J0140 Acute pansinusitis, unspecified: Secondary | ICD-10-CM

## 2022-07-01 MED ORDER — CEFDINIR 300 MG PO CAPS: 300.0000 mg | ORAL_CAPSULE | Freq: Two times a day (BID) | ORAL | 0 refills | Status: DC

## 2022-07-01 NOTE — Progress Notes (Signed)
Patient ID: [name], female    DOB: [birth year], 72 y.o.   MRN: [id number]  This visit was conducted in person.  BP 112/60   Pulse 71   Temp 97.7 F (36.5 C)   Resp 16   Ht 5' 3.5" (1.613 m)   Wt 194 lb 2 oz (88.1 kg)   SpO2 95%   BMI 33.85 kg/m    CC:  Chief Complaint  Patient presents with   Cough    Since flu positive in Jan   Nasal Congestion    Green mucus    Subjective:   HPI: [name] is a 72 y.o. female presenting on 07/01/2022 for Cough (Since flu positive in Jan) and Nasal Congestion (Green mucus)  She has had issues with cough since  05/21/22.Marland Kitchen Dx  sinus infeciton. Treated with Augmentin.  Later Dx with the flu.. treated symptomatically Seen at [facility] 06/17/2022   Reccommended resuming flovent 2 puff twice daily.  On Singulair and nasal steroid.   She states she has had improvement in breathing,  occ wheeze.  Still with nasal discharge greenish from nares. No face pain, no ear pain.  She noted hoarse voice with flovent.   Relevant past medical, surgical, family and social history reviewed and updated as indicated. Interim medical history since our last visit reviewed. Allergies and medications reviewed and updated. Outpatient Medications Prior to Visit  Medication Sig Dispense Refill   albuterol (VENTOLIN HFA) 108 (90 Base) MCG/ACT inhaler Inhale 2 puffs into the lungs every 6 (six) hours as needed for wheezing or shortness of breath. 18 g 1   alendronate (FOSAMAX) 70 MG tablet Take 1 tablet (70 mg total) by mouth every 7 (seven) days. Take with a full glass of water on an empty stomach. 4 tablet 11   ALLERGY RELIEF 180 MG tablet TAKE 1 TABLET(180 MG) BY MOUTH DAILY 30 tablet 5   atorvastatin (LIPITOR) 10 MG tablet TAKE 1 TABLET(10 MG) BY MOUTH DAILY 90 tablet 3   dicyclomine (BENTYL) 10 MG capsule TAKE 1 CAPSULE(10 MG) BY MOUTH EVERY 8 HOURS AS NEEDED FOR SPASMS OR ABDOMINAL PAIN 270 capsule 0   famotidine (PEPCID) 20 MG tablet  Take 1 tablet (20 mg total) by mouth 2 (two) times daily. 180 tablet 1   fluticasone (FLOVENT HFA) 110 MCG/ACT inhaler During upper respiratory infection/asthma flares start Flovent 110 mcg using 2 puffs twice a day with spacer for 1 to 2 weeks.  Rinse mouth out afterwards 3 each 1   imiquimod (ALDARA) 5 % cream Apply topically 3 (three) times a week. Apply to right vulva 3 times a week, use for 12 weeks 12 each 1   ipratropium (ATROVENT) 0.03 % nasal spray 1 to 2 sprays in each nostril twice a day as needed for runny nose/drainage down throat 30 mL 2   lansoprazole (PREVACID) 15 MG capsule Take 1 capsule (15 mg total) by mouth 2 (two) times daily before a meal. 180 capsule 1   losartan-hydrochlorothiazide (HYZAAR) 100-25 MG tablet TAKE 1 TABLET BY MOUTH EVERY DAY 90 tablet 3   montelukast (SINGULAIR) 10 MG tablet Take 1 tablet (10 mg total) by mouth at bedtime. 90 tablet 3   nystatin (MYCOSTATIN/NYSTOP) powder Apply 1 Application topically 2 (two) times daily.     nystatin cream (MYCOSTATIN) Apply topically 3 (three) times daily.     No facility-administered medications prior to visit.     Per HPI unless specifically indicated in ROS  section below Review of Systems  Constitutional:  Negative for fatigue and fever.  HENT:  Negative for congestion.   Eyes:  Negative for pain.  Respiratory:  Negative for cough and shortness of breath.   Cardiovascular:  Negative for chest pain, palpitations and leg swelling.  Gastrointestinal:  Negative for abdominal pain.  Genitourinary:  Negative for dysuria and vaginal bleeding.  Musculoskeletal:  Negative for back pain.  Neurological:  Negative for syncope, light-headedness and headaches.  Psychiatric/Behavioral:  Negative for dysphoric mood.    Objective:  BP 112/60   Pulse 71   Temp 97.7 F (36.5 C)   Resp 16   Ht 5' 3.5" (1.613 m)   Wt 194 lb 2 oz (88.1 kg)   SpO2 95%   BMI 33.85 kg/m   Wt Readings from Last 3 Encounters:  07/01/22 194 lb 2  oz (88.1 kg)  06/17/22 196 lb 3.2 oz (89 kg)  05/21/22 198 lb (89.8 kg)      Physical Exam Constitutional:      General: She is not in acute distress.    Appearance: Normal appearance. She is well-developed. She is not ill-appearing or toxic-appearing.  HENT:     Head: Normocephalic.     Right Ear: Hearing, tympanic membrane, ear canal and external ear normal. Tympanic membrane is not erythematous, retracted or bulging.     Left Ear: Hearing, tympanic membrane, ear canal and external ear normal. Tympanic membrane is not erythematous, retracted or bulging.     Nose: No mucosal edema or rhinorrhea.     Right Sinus: No maxillary sinus tenderness or frontal sinus tenderness.     Left Sinus: No maxillary sinus tenderness or frontal sinus tenderness.     Mouth/Throat:     Pharynx: Uvula midline.  Eyes:     General: Lids are normal. Lids are everted, no foreign bodies appreciated.     Conjunctiva/sclera: Conjunctivae normal.     Pupils: Pupils are equal, round, and reactive to light.  Neck:     Thyroid: No thyroid mass or thyromegaly.     Vascular: No carotid bruit.     Trachea: Trachea normal.  Cardiovascular:     Rate and Rhythm: Normal rate and regular rhythm.     Pulses: Normal pulses.     Heart sounds: Normal heart sounds, S1 normal and S2 normal. No murmur heard.    No friction rub. No gallop.  Pulmonary:     Effort: Pulmonary effort is normal. No tachypnea or respiratory distress.     Breath sounds: Normal breath sounds. No decreased breath sounds, wheezing, rhonchi or rales.  Abdominal:     General: Bowel sounds are normal.     Palpations: Abdomen is soft.     Tenderness: There is no abdominal tenderness.  Musculoskeletal:     Cervical back: Normal range of motion and neck supple.  Skin:    General: Skin is warm and dry.     Findings: No rash.  Neurological:     Mental Status: She is alert.  Psychiatric:        Mood and Affect: Mood is not anxious or depressed.         Speech: Speech normal.        Behavior: Behavior normal. Behavior is cooperative.        Thought Content: Thought content normal.        Judgment: Judgment normal.       Results for orders placed or performed in visit on 10/06/21  Lipid panel  Result Value Ref Range   Cholesterol 175 0 - 200 mg/dL   Triglycerides 137.0 0.0 - 149.0 mg/dL   HDL 64.50 >39.00 mg/dL   VLDL 27.4 0.0 - 40.0 mg/dL   LDL Cholesterol 83 0 - 99 mg/dL   Total CHOL/HDL Ratio 3    NonHDL 110.87   Comprehensive metabolic panel  Result Value Ref Range   Sodium 139 135 - 145 mEq/L   Potassium 3.6 3.5 - 5.1 mEq/L   Chloride 100 96 - 112 mEq/L   CO2 31 19 - 32 mEq/L   Glucose, Bld 92 70 - 99 mg/dL   BUN 12 6 - 23 mg/dL   Creatinine, Ser 1.05 0.40 - 1.20 mg/dL   Total Bilirubin 0.5 0.2 - 1.2 mg/dL   Alkaline Phosphatase 58 39 - 117 U/L   AST 18 0 - 37 U/L   ALT 17 0 - 35 U/L   Total Protein 7.2 6.0 - 8.3 g/dL   Albumin 4.1 3.5 - 5.2 g/dL   GFR 54.51 (L) >60.00 mL/min   Calcium 9.4 8.4 - 10.5 mg/dL  CBC with Differential/Platelet  Result Value Ref Range   WBC 7.6 4.0 - 10.5 K/uL   RBC 4.11 3.87 - 5.11 Mil/uL   Hemoglobin 12.8 12.0 - 15.0 g/dL   HCT 38.2 36.0 - 46.0 %   MCV 93.0 78.0 - 100.0 fl   MCHC 33.4 30.0 - 36.0 g/dL   RDW 12.6 11.5 - 15.5 %   Platelets 289.0 150.0 - 400.0 K/uL   Neutrophils Relative % 51.8 43.0 - 77.0 %   Lymphocytes Relative 37.2 12.0 - 46.0 %   Monocytes Relative 7.4 3.0 - 12.0 %   Eosinophils Relative 2.7 0.0 - 5.0 %   Basophils Relative 0.9 0.0 - 3.0 %   Neutro Abs 3.9 1.4 - 7.7 K/uL   Lymphs Abs 2.8 0.7 - 4.0 K/uL   Monocytes Absolute 0.6 0.1 - 1.0 K/uL   Eosinophils Absolute 0.2 0.0 - 0.7 K/uL   Basophils Absolute 0.1 0.0 - 0.1 K/uL  IBC + Ferritin  Result Value Ref Range   Iron 79 42 - 145 ug/dL   Transferrin 211.0 (L) 212.0 - 360.0 mg/dL   Saturation Ratios 26.7 20.0 - 50.0 %   Ferritin 96.3 10.0 - 291.0 ng/mL   TIBC 295.4 250.0 - 450.0 mcg/dL    Assessment and  Plan  Acute non-recurrent pansinusitis Assessment & Plan: Continued symptoms now going on more than 30 days. Recent antibiotics in December, Augmentin. She will start with nasal saline irrigation and spray. If symptoms not improving following this... Symptoms may be continued nasal discharge secondary to inflammation and excessive mucus production but given continued color change of the mucus we will cover with an antibiotic.   Moderate persistent asthma without complication Assessment & Plan: Chronic, no clear ongoing asthma exacerbation.  Continue Flovent, Flonase and Singulair as directed by allergist.   Other orders -     Cefdinir; Take 1 capsule (300 mg total) by mouth 2 (two) times daily.  Dispense: 20 capsule; Refill: 0    No follow-ups on file.   Eliezer Lofts, MD

## 2022-07-01 NOTE — Assessment & Plan Note (Signed)
Continued symptoms now going on more than 30 days. Recent antibiotics in December, Augmentin. She will start with nasal saline irrigation and spray. If symptoms not improving following this... Symptoms may be continued nasal discharge secondary to inflammation and excessive mucus production but given continued color change of the mucus we will cover with an antibiotic.

## 2022-07-01 NOTE — Assessment & Plan Note (Signed)
Chronic, no clear ongoing asthma exacerbation.  Continue Flovent, Flonase and Singulair as directed by allergist.

## 2022-07-01 NOTE — Patient Instructions (Addendum)
Start nasal saline irrigation or spray to clean sinus.  If not improving complete course of antibiotics.  Continue regular medication.   Go to ER for severe shortness of breath.

## 2022-07-18 ENCOUNTER — Other Ambulatory Visit: Payer: Self-pay | Admitting: Family Medicine

## 2022-07-22 ENCOUNTER — Other Ambulatory Visit: Payer: Self-pay | Admitting: Family

## 2022-08-12 ENCOUNTER — Other Ambulatory Visit: Payer: Self-pay | Admitting: Internal Medicine

## 2022-08-12 ENCOUNTER — Other Ambulatory Visit: Payer: Self-pay | Admitting: Family Medicine

## 2022-08-12 ENCOUNTER — Encounter: Payer: Self-pay | Admitting: Family Medicine

## 2022-08-12 ENCOUNTER — Telehealth (INDEPENDENT_AMBULATORY_CARE_PROVIDER_SITE_OTHER): Payer: Medicare Other | Admitting: Family Medicine

## 2022-08-12 VITALS — Temp 99.3°F | Ht 63.5 in | Wt 191.0 lb

## 2022-08-12 DIAGNOSIS — U071 COVID-19: Secondary | ICD-10-CM

## 2022-08-12 MED ORDER — NIRMATRELVIR/RITONAVIR (PAXLOVID)TABLET: 3.0000 | ORAL_TABLET | Freq: Two times a day (BID) | ORAL | 0 refills | Status: AC

## 2022-08-12 MED ORDER — GUAIFENESIN-CODEINE 100-10 MG/5ML PO SYRP: 5.0000 mL | ORAL_SOLUTION | Freq: Every evening | ORAL | 0 refills | Status: DC | PRN

## 2022-08-12 NOTE — Progress Notes (Signed)
TELEPHONE VISIT  Due to national recommendations of social distancing due to [place] 19, Audio telehealth visit is felt to be most appropriate for this patient at this time.   She attempted to do a MyChart video virtual visit but was unable to get her video and microphone working properly.  We discussed proceeding with a telephone visit.  She stated she was agreeable to doing this even if there was a potential charge if not covered by insurance.  I connected with [name] on 08/12/22 at  2:00 PM EST by telephone and verified that I am speaking with the correct person using two identifiers.   I discussed the limitations, risks, security and privacy concerns of performing an evaluation and management service by telephone and the availability of in person appointments. I also discussed with the patient that there may be a patient responsible charge related to this service. The patient expressed understanding and agreed to proceed.  Patient location: Home Provider Location: [name] Participants: [name] and [name]   History of Present Illness:  72 year old female with history of asthma present with COVID.  Date of onset: 2 day Initial symptoms included runny nose, headache, facial pain, fever 101.1 F, ear pain. Symptoms progressed to  hoarse voice,  productive cough.  No SOB, no wheeze.   Sick contacts:  sister COVID testing:    yes 3 days ago     She has tried to treat with  albuterol prn.  Tylenol for fever.     Has history of asthma, moderate persistent treated with allergy meds, flovent HFA and albuterol prn. Non-smoker.  COVID 19 screen No recent travel or known exposure to COVID19 The patient denies respiratory symptoms of COVID 19 at this time.  The importance of social distancing was discussed today.   Review of Systems  Constitutional:  Negative for chills and fever.  HENT:  Negative for congestion and ear pain.   Eyes:  Negative for pain and  redness.  Respiratory:  Negative for cough and shortness of breath.   Cardiovascular:  Negative for chest pain, palpitations and leg swelling.  Gastrointestinal:  Negative for abdominal pain, blood in stool, constipation, diarrhea, nausea and vomiting.  Genitourinary:  Negative for dysuria.  Musculoskeletal:  Negative for falls and myalgias.  Skin:  Negative for rash.  Neurological:  Negative for dizziness.  Psychiatric/Behavioral:  Negative for depression. The patient is not nervous/anxious.       Past Medical History:  Diagnosis Date   Allergic rhinitis    Arthritis    Asthma    mild no inaler use   GERD (gastroesophageal reflux disease)    Headache(784.0)    Migraines   History of adenomatous polyp of colon    History of concussion    AS CHILD--  NO RESIDUAL   History of palpitations    Hypertension    Paget's disease of vulva (HCC)    PONV (postoperative nausea and vomiting)    SEVERE   Wears glasses     reports that she quit smoking about 33 years ago. Her smoking use included cigarettes. She has never used smokeless tobacco. She reports that she does not currently use alcohol after a past usage of about 1.0 standard drink of alcohol per week. She reports that she does not use drugs.   Current Outpatient Medications:    albuterol (VENTOLIN HFA) 108 (90 Base) MCG/ACT inhaler, Inhale 2 puffs into the lungs every 6 (six) hours as  needed for wheezing or shortness of breath., Disp: 18 g, Rfl: 1   alendronate (FOSAMAX) 70 MG tablet, Take 1 tablet (70 mg total) by mouth every 7 (seven) days. Take with a full glass of water on an empty stomach., Disp: 4 tablet, Rfl: 11   ALLERGY RELIEF 180 MG tablet, TAKE 1 TABLET(180 MG) BY MOUTH DAILY, Disp: 30 tablet, Rfl: 5   atorvastatin (LIPITOR) 10 MG tablet, TAKE 1 TABLET(10 MG) BY MOUTH DAILY, Disp: 90 tablet, Rfl: 3   dicyclomine (BENTYL) 10 MG capsule, TAKE 1 CAPSULE(10 MG) BY MOUTH EVERY 8 HOURS AS NEEDED FOR SPASMS OR ABDOMINAL PAIN,  Disp: 270 capsule, Rfl: 0   famotidine (PEPCID) 20 MG tablet, Take 1 tablet (20 mg total) by mouth 2 (two) times daily., Disp: 180 tablet, Rfl: 1   fluticasone (FLOVENT HFA) 110 MCG/ACT inhaler, During upper respiratory infection/asthma flares start Flovent 110 mcg using 2 puffs twice a day with spacer for 1 to 2 weeks.  Rinse mouth out afterwards, Disp: 3 each, Rfl: 1   guaiFENesin-codeine (ROBITUSSIN AC) 100-10 MG/5ML syrup, Take 5-10 mLs by mouth at bedtime as needed for cough., Disp: 180 mL, Rfl: 0   ipratropium (ATROVENT) 0.03 % nasal spray, INSTILL 1-2 SPRAYS IN EACH NOSTRIL TWICE DAILY AS NEEDED, Disp: 30 mL, Rfl: 5   lansoprazole (PREVACID) 15 MG capsule, Take 1 capsule (15 mg total) by mouth 2 (two) times daily before a meal., Disp: 180 capsule, Rfl: 1   losartan-hydrochlorothiazide (HYZAAR) 100-25 MG tablet, TAKE 1 TABLET BY MOUTH EVERY DAY, Disp: 90 tablet, Rfl: 3   montelukast (SINGULAIR) 10 MG tablet, Take 1 tablet (10 mg total) by mouth at bedtime., Disp: 90 tablet, Rfl: 3   nirmatrelvir/ritonavir (PAXLOVID) 20 x 150 MG & 10 x '100MG'$  TABS, Take 3 tablets by mouth 2 (two) times daily for 5 days. (Take nirmatrelvir 150 mg two tablets twice daily for 5 days and ritonavir 100 mg one tablet twice daily for 5 days) Patient GFR is  > 60., Disp: 30 tablet, Rfl: 0   nystatin (MYCOSTATIN/NYSTOP) powder, Apply 1 Application topically 2 (two) times daily., Disp: , Rfl:    nystatin cream (MYCOSTATIN), Apply topically 3 (three) times daily., Disp: , Rfl:    imiquimod (ALDARA) 5 % cream, Apply topically 3 (three) times a week. Apply to right vulva 3 times a week, use for 12 weeks (Patient not taking: Reported on 08/12/2022), Disp: 12 each, Rfl: 1   Observations/Objective: Temperature 99.3 F (37.4 C), height 5' 3.5" (1.613 m), weight 191 lb (86.6 kg).  Physical Exam  Physical Exam Constitutional:      General: The patient is not in acute distress. Pulmonary:     Effort: Pulmonary effort is normal.  No respiratory distress.  Neurological:     Mental Status: The patient is alert and oriented to person, place, and time.  Psychiatric:        Mood and Affect: Mood normal.        Behavior: Behavior normal.    Assessment and Plan COVID-19 COVID19  Infection < 5 days from onset of symptoms in UNvaccinated overweight individual with history of  asthma  No clear sign of bacterial infection at this time.   No SOB.  No red flags/need for ER visit or in-person exam at respiratory clinic at this time..    Pt higher risk for COVID complications given not vaccinated, asthma, age > 33  and overweight status. GFR  >60 and no medication contraindications.  Start  paxlovid 5 day course. Reviewed course of medication and side effect profile with patient in detail.   Symptomatic care with mucinex and cough suppressant at night. If SOB begins symptoms worsening.. have low threshold for in-person exam, if severe shortness of breath ER visit recommended.  Can monitor Oxygen saturation at home with home monitor if able to obtain.  Go to ER if O2 sat < 90% on room air.   Reviewed home care and provided information through [facility].  Recommended quarantine 5 days isolation recommended. Return to work day 6 and wear mask for 4 more days to complete 10 days. Provided info about prevention of spread of COVID 19.     I discussed the assessment and treatment plan with the patient. The patient was provided an opportunity to ask questions and all were answered. The patient agreed with the plan and demonstrated an understanding of the instructions.   The patient was advised to call back or seek an in-person evaluation if the symptoms worsen or if the condition fails to improve as anticipated.  I provided 8 minutes of non-face-to-face time during this encounter.   Eliezer Lofts, MD

## 2022-08-12 NOTE — Assessment & Plan Note (Signed)
COVID19  Infection < 5 days from onset of symptoms in UNvaccinated overweight individual with history of  asthma  No clear sign of bacterial infection at this time.   No SOB.  No red flags/need for ER visit or in-person exam at respiratory clinic at this time..    Pt higher risk for COVID complications given not vaccinated, asthma, age > 49  and overweight status. GFR  >60 and no medication contraindications.  Start paxlovid 5 day course. Reviewed course of medication and side effect profile with patient in detail.   Symptomatic care with mucinex and cough suppressant at night. If SOB begins symptoms worsening.. have low threshold for in-person exam, if severe shortness of breath ER visit recommended.  Can monitor Oxygen saturation at home with home monitor if able to obtain.  Go to ER if O2 sat < 90% on room air.   Reviewed home care and provided information through [facility].  Recommended quarantine 5 days isolation recommended. Return to work day 6 and wear mask for 4 more days to complete 10 days. Provided info about prevention of spread of COVID 19.

## 2022-08-17 ENCOUNTER — Encounter: Payer: Self-pay | Admitting: Family Medicine

## 2022-08-17 ENCOUNTER — Ambulatory Visit (INDEPENDENT_AMBULATORY_CARE_PROVIDER_SITE_OTHER): Payer: Medicare Other | Admitting: Family Medicine

## 2022-08-17 ENCOUNTER — Telehealth: Payer: Self-pay | Admitting: Family Medicine

## 2022-08-17 VITALS — BP 123/80 | HR 82 | Temp 97.6°F | Ht 63.5 in | Wt 191.2 lb

## 2022-08-17 DIAGNOSIS — U071 COVID-19: Secondary | ICD-10-CM | POA: Diagnosis not present

## 2022-08-17 DIAGNOSIS — J0140 Acute pansinusitis, unspecified: Secondary | ICD-10-CM

## 2022-08-17 MED ORDER — AMOXICILLIN-POT CLAVULANATE 875-125 MG PO TABS: 1.0000 | ORAL_TABLET | Freq: Two times a day (BID) | ORAL | 0 refills | Status: DC

## 2022-08-17 NOTE — Progress Notes (Signed)
Subjective:    Patient ID: [name], female    DOB: 28-Oct-1950, 72 y.o.   MRN: [id number]  HPI 72 yo pt of Dr Diona Browner presents for f/u of covid 19 with worsening sinus symptoms Former smoker with h/o asthma  takes singulair and albuterol and atrovent   Wt Readings from Last 3 Encounters:  08/17/22 191 lb 4 oz (86.8 kg)  08/12/22 191 lb (86.6 kg)  07/01/22 194 lb 2 oz (88.1 kg)   33.35 kg/m  Vitals:   08/17/22 1513  BP: (!) 148/90  Pulse: 82  Temp: 97.6 F (36.4 C)  SpO2: 96%   Last day of paxlovid today  Tested pos for covid on 3/5 Symptoms started on 3/6 Sister had it  A little winded  Some dizziness  Headache  Prod cough- clear phlegm  Green mucous from her nose -thick Face is hurting - cheeks are sore and tender  Left side is more congested than the right   Fever is better (had it for 4 days)  Occ wheezing/ is better than it was  Cough is improving    Otc  Tylenol    Has asthma /reactiv airways Rev last allergy note from Dr Nelva Bush  Last sinus infection was in December   Patient Active Problem List   Diagnosis Date Noted   Acute non-recurrent pansinusitis 05/21/2022   Pulsatile tinnitus of right ear 09/17/2021   Acute bronchitis with asthma with acute exacerbation 06/03/2021   New onset tinnitus of right ear 05/13/2021   COVID-19 04/01/2021   Moderate persistent asthma without complication [id number]   Seasonal and perennial allergic rhinitis 02/13/2021   Gastroesophageal reflux disease with esophagitis 02/13/2021   Osteoporosis 07/31/2019   Hypercholesteremia 04/17/2019   LUQ abdominal pain 08/15/2018   LUQ discomfort 01/10/2018   Headache 01/10/2018   Memory loss 12/13/2017   Anemia 09/06/2017   Laryngopharyngeal reflux (LPR) 10/19/2016   Chronic cough 08/05/2015   Reactive airway disease 04/24/2013   Paget's disease of vulva ([place]) 05/01/2012   COLONIC POLYPS, ADENOMATOUS, HX OF 12/11/2008   Migraine without aura 10/10/2008    Essential hypertension, benign 10/10/2008   Allergic rhinitis 10/10/2008   GERD 10/10/2008   PERIMENOPAUSAL SYNDROME 10/10/2008   OSTEOARTHRITIS 10/10/2008   DEGENERATIVE [place] DISEASE, CERVICAL SPINE 10/10/2008   Past Medical History:  Diagnosis Date   Allergic rhinitis    Arthritis    Asthma    mild no inaler use   GERD (gastroesophageal reflux disease)    Headache(784.0)    Migraines   History of adenomatous polyp of colon    History of concussion    AS CHILD--  NO RESIDUAL   History of palpitations    Hypertension    Paget's disease of vulva ([place])    PONV (postoperative nausea and vomiting)    SEVERE   Wears glasses    Past Surgical History:  Procedure Laterality Date   mucoid cyst removal thumb     PULLERY RELEASE LEFT THUMB, LEFT RING FINGER/  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  02-17-2011   PULLEY RELEASE RIGHT THUMB  03-23-2011   SIMPLE VULVECTOMY  03-24-2010   right side   TONSILLECTOMY  1977   TRIGGER FINGER RELEASE     VAGINAL HYSTERECTOMY  1989   and Anterior and posterior repair's for prolapse partial   VULVECTOMY Right 01/10/2015   Procedure: RIGHT WIDE LOCAL EXCISION VULVECTOMY;  Surgeon: [name], MD;  Location: [facility];  Service:  Gynecology;  Laterality: Right;   VULVECTOMY Right 04/10/2015   Procedure: WIDE LOCAL EXCISION VULVA;  Surgeon: [name], MD;  Location: [facility];  Service: Gynecology;  Laterality: Right;   VULVECTOMY PARTIAL N/A 11/07/2018   Procedure: VULVECTOMY PARTIAL;  Surgeon: [name], MD;  Location: [facility];  Service: Gynecology;  Laterality: N/A;   WISDOM TOOTH EXTRACTION     Social History   Tobacco Use   Smoking status: Former    Years: 5.00    Types: Cigarettes    Quit date: 04/15/1989    Years since quitting: 33.3   Smokeless tobacco: Never  Vaping Use   Vaping Use: Never used  Substance Use Topics   Alcohol use: Not  Currently    Alcohol/week: 1.0 standard drink of alcohol    Types: 1 Glasses of wine per week    Comment: occasional wine   Drug use: No   Family History  Problem Relation Age of Onset   Cancer Maternal Grandmother    Hypertension Father    Heart disease Father    Asthma Father    Hypertension Mother    Heart disease Mother    Breast cancer Paternal Grandmother 31   Colon cancer Paternal Grandmother    Colon cancer Paternal Uncle    Allergic rhinitis Neg Hx    Angioedema Neg Hx    Eczema Neg Hx    Immunodeficiency Neg Hx    Urticaria Neg Hx    Esophageal cancer Neg Hx    Rectal cancer Neg Hx    Stomach cancer Neg Hx    Allergies  Allergen Reactions   Ibuprofen Hypertension   Naprosyn [Naproxen] Nausea Only   Other     Allergic to toothpaste- makes mouth peel Hay fever/dust mites/trees "365 allergic"    Sevoflurane Nausea And Vomiting   Azithromycin Itching and Rash   Sulfa Antibiotics Hives and Rash   Current Outpatient Medications on File Prior to Visit  Medication Sig Dispense Refill   albuterol (VENTOLIN HFA) 108 (90 Base) MCG/ACT inhaler Inhale 2 puffs into the lungs every 6 (six) hours as needed for wheezing or shortness of breath. 18 g 1   alendronate (FOSAMAX) 70 MG tablet Take 1 tablet (70 mg total) by mouth every 7 (seven) days. Take with a full glass of water on an empty stomach. 4 tablet 11   atorvastatin (LIPITOR) 10 MG tablet TAKE 1 TABLET(10 MG) BY MOUTH DAILY 90 tablet 3   dicyclomine (BENTYL) 10 MG capsule TAKE 1 CAPSULE(10 MG) BY MOUTH EVERY 8 HOURS AS NEEDED FOR SPASMS OR ABDOMINAL PAIN 270 capsule 0   famotidine (PEPCID) 20 MG tablet Take 1 tablet (20 mg total) by mouth 2 (two) times daily. 180 tablet 1   fexofenadine (ALLERGY RELIEF) 180 MG tablet Take 1 tablet (180 mg total) by mouth daily as needed for allergies or rhinitis (Can take an extra dose during flare ups.). 180 tablet 0   guaiFENesin-codeine (ROBITUSSIN AC) 100-10 MG/5ML syrup Take 5-10 mLs  by mouth at bedtime as needed for cough. 180 mL 0   imiquimod (ALDARA) 5 % cream Apply topically 3 (three) times a week. Apply to right vulva 3 times a week, use for 12 weeks 12 each 1   ipratropium (ATROVENT) 0.03 % nasal spray INSTILL 1-2 SPRAYS IN EACH NOSTRIL TWICE DAILY AS NEEDED 30 mL 5   lansoprazole (PREVACID) 15 MG capsule Take 1 capsule (15 mg total) by mouth 2 (two) times daily before  a meal. 180 capsule 1   losartan-hydrochlorothiazide (HYZAAR) 100-25 MG tablet TAKE 1 TABLET BY MOUTH EVERY DAY 90 tablet 3   montelukast (SINGULAIR) 10 MG tablet Take 1 tablet (10 mg total) by mouth at bedtime. 90 tablet 3   nirmatrelvir/ritonavir (PAXLOVID) 20 x 150 MG & 10 x '100MG'$  TABS Take 3 tablets by mouth 2 (two) times daily for 5 days. (Take nirmatrelvir 150 mg two tablets twice daily for 5 days and ritonavir 100 mg one tablet twice daily for 5 days) Patient GFR is  > 60. 30 tablet 0   nystatin (MYCOSTATIN/NYSTOP) powder Apply 1 Application topically 2 (two) times daily.     nystatin cream (MYCOSTATIN) Apply topically 3 (three) times daily.     No current facility-administered medications on file prior to visit.     Review of Systems  Constitutional:  Positive for appetite change and fatigue. Negative for fever.  HENT:  Positive for congestion, postnasal drip, rhinorrhea, sinus pressure, sinus pain, sneezing and sore throat. Negative for ear pain.   Eyes:  Negative for pain and discharge.  Respiratory:  Positive for cough. Negative for shortness of breath, wheezing and stridor.   Cardiovascular:  Negative for chest pain.  Gastrointestinal:  Negative for diarrhea, nausea and vomiting.  Genitourinary:  Negative for frequency, hematuria and urgency.  Musculoskeletal:  Negative for arthralgias and myalgias.  Skin:  Negative for rash.  Neurological:  Positive for headaches. Negative for dizziness, weakness and light-headedness.  Psychiatric/Behavioral:  Negative for confusion and dysphoric mood.         Objective:   Physical Exam Constitutional:      General: She is not in acute distress.    Appearance: Normal appearance. She is well-developed. She is obese. She is not ill-appearing or diaphoretic.  HENT:     Head: Normocephalic and atraumatic.     Comments: Bilateral maxillary sinus tenderness No facial swelling     Right Ear: Tympanic membrane, ear canal and external ear normal.     Left Ear: Tympanic membrane, ear canal and external ear normal.     Nose: Congestion and rhinorrhea present.     Mouth/Throat:     Pharynx: Oropharynx is clear. No oropharyngeal exudate or posterior oropharyngeal erythema.     Comments: Clear pnd Eyes:     General:        Right eye: No discharge.        Left eye: No discharge.     Conjunctiva/sclera: Conjunctivae normal.     Pupils: Pupils are equal, round, and reactive to light.  Cardiovascular:     Rate and Rhythm: Normal rate and regular rhythm.  Pulmonary:     Effort: Pulmonary effort is normal. No respiratory distress.     Breath sounds: Normal breath sounds. No stridor. No wheezing, rhonchi or rales.     Comments: Good air exch No rales or rhonchi Musculoskeletal:     Cervical back: Normal range of motion and neck supple.  Lymphadenopathy:     Cervical: No cervical adenopathy.  Skin:    General: Skin is warm and dry.     Findings: No rash.  Neurological:     Mental Status: She is alert.     Cranial Nerves: No cranial nerve deficit.     Coordination: Coordination normal.  Psychiatric:        Mood and Affect: Mood normal.           Assessment & Plan:   Problem List Items Addressed This Visit  Respiratory   Acute non-recurrent pansinusitis - Primary    In the midst of covid- finished paxlovid today  Worsening facial pain with purulent nasal drainage and headache  Reassuring exam  Lungs sound clear  Disc symptom control see AVS Disc ER precautions Px augmentin Continue nasal saline Continue allergy meds   Update if not starting to improve in a week or if worsening        Relevant Medications   amoxicillin-clavulanate (AUGMENTIN) 875-125 MG tablet     Other   COVID-19    Reviewed notes from Dr Diona Browner Unvaccinated Treated with paxlovid  Does have h/o asthma (rev last allergist note and meds)  Improving but not s/s of bacterial sinusitis (will tx with augmentin)  Finished paxlovid today Inst to continue isolating until symptoms improve Reassuring lung exam and 02 sat   Update if not starting to improve in a week or if worsening

## 2022-08-17 NOTE — Assessment & Plan Note (Addendum)
Reviewed notes from Dr Diona Browner Unvaccinated Treated with paxlovid  Does have h/o asthma (rev last allergist note and meds)  Improving but not s/s of bacterial sinusitis (will tx with augmentin)  Finished paxlovid today Inst to continue isolating until symptoms improve Reassuring lung exam and 02 sat   Update if not starting to improve in a week or if worsening

## 2022-08-17 NOTE — Telephone Encounter (Signed)
I spoke with pt; pt tested + covid on 08/10/22; pt said symptoms did not start until 08/11/22 but pt had been exposed to covid by sister. pt said she is not sure if any SOB. Pt said she has not been up a lot this morning and is not sure if winded.oxygen level has been in 90's. Now H/A pain level 2 but last night H/A pain  level was 5 or 6. Prod cough with clear phlegm but when blows nose has clumps of green mucus. No CP, no fever and no S/t.Marland Kitchen When turns head to right has dizziness where room spins. Pt taking last day of paxlovid today.pt scheduled in office appt with mask 08/17/22 at 3:30 with Dr Glori Bickers; spoke with [facility] CMA and sending note to Dr Glori Bickers and Dr Diona Browner as Juluis Rainier to PCP. UC & ED precautions given and pt voiced understanding. Pt is trying to drink plenty of fluids.

## 2022-08-17 NOTE — Telephone Encounter (Signed)
Pt stated she saw [facility] virtually on 3/7 & since then a lot of discharge is coming from her nose, headaches, can't breath from nose. Pt is asking for advice. Call back # [phone]

## 2022-08-17 NOTE — Patient Instructions (Signed)
Conitnue to isolate until you feel better   Drink fluids and rest  Wear a mask around family   Take augmentin for sinus infection  Drink fluids and rest   Drink fluids and rest  mucinex DM is good for cough and congestion  Nasal saline for congestion as needed  Tylenol for fever or pain or headache  Please alert [place] if symptoms worsen (if severe or short of breath please go to the ER)    Update if not starting to improve in a week or if worsening

## 2022-08-17 NOTE — Assessment & Plan Note (Signed)
In the midst of covid- finished paxlovid today  Worsening facial pain with purulent nasal drainage and headache  Reassuring exam  Lungs sound clear  Disc symptom control see AVS Disc ER precautions Px augmentin Continue nasal saline Continue allergy meds  Update if not starting to improve in a week or if worsening

## 2022-08-17 NOTE — Telephone Encounter (Signed)
Noted  

## 2022-08-19 ENCOUNTER — Other Ambulatory Visit: Payer: Self-pay | Admitting: Family

## 2022-08-26 ENCOUNTER — Telehealth: Payer: Self-pay | Admitting: Family Medicine

## 2022-08-26 NOTE — Telephone Encounter (Signed)
Ramireno to schedule their annual wellness visit. Appointment made for 09/20/2022.  [place] Direct Dial: [phone]

## 2022-09-14 DIAGNOSIS — R208 Other disturbances of skin sensation: Secondary | ICD-10-CM | POA: Diagnosis not present

## 2022-09-14 DIAGNOSIS — L304 Erythema intertrigo: Secondary | ICD-10-CM | POA: Diagnosis not present

## 2022-09-14 DIAGNOSIS — L820 Inflamed seborrheic keratosis: Secondary | ICD-10-CM | POA: Diagnosis not present

## 2022-09-14 DIAGNOSIS — L538 Other specified erythematous conditions: Secondary | ICD-10-CM | POA: Diagnosis not present

## 2022-09-20 NOTE — Progress Notes (Unsigned)
09/21/2022 [name] [id number] 1950/10/27  Referring provider: Excell Seltzer, MD Primary GI doctor: Dr. Leone Payor  ASSESSMENT AND PLAN:   LUQ abdominal pain Possible splenic flexure syndrome versus muscular EGD 2019 benign polyps No associated symptoms, worse with bending/moving Refilled dicyclomine, try salon pas patches, FODMAP given Call if any worsening or new symptoms, no need for endoscopic evaluation at this time.   Gastroesophageal reflux disease, unspecified whether esophagitis present Lifestyle changes discussed, avoid NSAIDS, ETOH Continue on the same medication, reports symptoms are well controlled.   COLONIC POLYPS, ADENOMATOUS, HX OF 07/31/2015 colonoscopy unremarkable, recall 07/2025   Patient Care Team: Excell Seltzer, MD as PCP - General  HISTORY OF PRESENT ILLNESS: 72 y.o. female with a past medical history of arthritis, asthma, hypertension, IDA and GERD and others listed below presents for evaluation of abdominal pain.  07/31/2015 colonoscopy unremarkable, recall 07/2025 04/18/2018 endoscopy numerous fundic gland gastric polyps 02/13/2020 office visit with Alcide Evener for abdominal pain left upper quadrant that visit had normal CBC without anemia, normal ferritin, negative celiac, normal kidney and liver 02/22/2020 CT abdomen pelvis with contrast for left upper quadrant pain shows no acute findings, left colon diverticulosis without diverticulitis  She has been having AB discomfort and cramping. Worse with leaning over or bending to the left side very sharp pain.  She feel fullness and weight all the time.  Has run out of dicyclomine which was helping cramping.  No associated nausea, vomiting.  Denies bloating, gas. Has some beltching.  She has GERD but is on lansoprazole and pepcid twice daily, this controls her GERD.  Denies dysphagia, no melena.  She has BM daily, formed, no blood, no straining.  She has had intentional weight loss,  she has been walking.   She denies blood thinner use.  She reports rare NSAID use, once a month.   She denies ETOH use, use to drink daily but only has one glass of wine 1-2 times a month.   She denies tobacco use.  She denies drug use.    She  reports that she quit smoking about 33 years ago. Her smoking use included cigarettes. She has never used smokeless tobacco. She reports that she does not currently use alcohol after a past usage of about 1.0 standard drink of alcohol per week. She reports that she does not use drugs.  RELEVANT LABS AND IMAGING: CBC    Component Value Date/Time   WBC 7.6 10/06/2021 0925   RBC 4.11 10/06/2021 0925   HGB 12.8 10/06/2021 0925   HGB 12.3 08/10/2011 1133   HCT 38.2 10/06/2021 0925   HCT 36.5 08/10/2011 1133   PLT 289.0 10/06/2021 0925   PLT 208 08/10/2011 1133   MCV 93.0 10/06/2021 0925   MCV 94 08/10/2011 1133   MCH 31.4 05/28/2012 2147   MCHC 33.4 10/06/2021 0925   RDW 12.6 10/06/2021 0925   RDW 12.3 08/10/2011 1133   LYMPHSABS 2.8 10/06/2021 0925   MONOABS 0.6 10/06/2021 0925   EOSABS 0.2 10/06/2021 0925   BASOSABS 0.1 10/06/2021 0925   Recent Labs    10/06/21 0925  HGB 12.8    CMP     Component Value Date/Time   NA 139 10/06/2021 0925   NA 143 08/10/2011 1133   K 3.6 10/06/2021 0925   K 4.4 08/10/2011 1133   CL 100 10/06/2021 0925   CL 107 08/10/2011 1133   CO2 31 10/06/2021 0925   CO2 26 08/10/2011 1133  GLUCOSE 92 10/06/2021 0925   GLUCOSE 90 08/10/2011 1133   BUN 12 10/06/2021 0925   BUN 10 08/10/2011 1133   CREATININE 1.05 10/06/2021 0925   CREATININE 0.84 08/10/2011 1133   CREATININE 0.88 12/30/2010 1048   CALCIUM 9.4 10/06/2021 0925   CALCIUM 8.9 08/10/2011 1133   PROT 7.2 10/06/2021 0925   ALBUMIN 4.1 10/06/2021 0925   AST 18 10/06/2021 0925   ALT 17 10/06/2021 0925   ALKPHOS 58 10/06/2021 0925   BILITOT 0.5 10/06/2021 0925   GFRNONAA 76 (L) 08/14/2011 2257   GFRNONAA >60 08/10/2011 1133   GFRAA 88 (L)  08/14/2011 2257   GFRAA >60 08/10/2011 1133      Latest Ref Rng & Units 10/06/2021    9:25 AM 09/17/2020   11:31 AM 06/18/2020    9:18 AM  Hepatic Function  Total Protein 6.0 - 8.3 g/dL 7.2  7.0  7.0   Albumin 3.5 - 5.2 g/dL 4.1  4.0  4.3   AST 0 - 37 U/L ALT 0 - 35 U/L Alk Phosphatase 39 - 117 U/L 58  43  41   Total Bilirubin 0.2 - 1.2 mg/dL 0.5  0.7  0.5       Current Medications:   Current Outpatient Medications (Endocrine & Metabolic):    alendronate (FOSAMAX) 70 MG tablet, Take 1 tablet (70 mg total) by mouth every 7 (seven) days. Take with a full glass of water on an empty stomach.  Current Outpatient Medications (Cardiovascular):    atorvastatin (LIPITOR) 10 MG tablet, TAKE 1 TABLET(10 MG) BY MOUTH DAILY   losartan-hydrochlorothiazide (HYZAAR) 100-25 MG tablet, TAKE 1 TABLET BY MOUTH EVERY DAY  Current Outpatient Medications (Respiratory):    albuterol (VENTOLIN HFA) 108 (90 Base) MCG/ACT inhaler, INHALE 2 PUFFS INTO THE LUNGS EVERY 6 HOURS AS NEEDED FOR WHEEZING OR SHORTNESS OF BREATH   fexofenadine (ALLERGY RELIEF) 180 MG tablet, Take 1 tablet (180 mg total) by mouth daily as needed for allergies or rhinitis (Can take an extra dose during flare ups.).   ipratropium (ATROVENT) 0.03 % nasal spray, INSTILL 1-2 SPRAYS IN EACH NOSTRIL TWICE DAILY AS NEEDED   montelukast (SINGULAIR) 10 MG tablet, Take 1 tablet (10 mg total) by mouth at bedtime.    Current Outpatient Medications (Other):    famotidine (PEPCID) 20 MG tablet, Take 1 tablet (20 mg total) by mouth 2 (two) times daily.   imiquimod (ALDARA) 5 % cream, Apply topically 3 (three) times a week. Apply to right vulva 3 times a week, use for 12 weeks   lansoprazole (PREVACID) 15 MG capsule, Take 1 capsule (15 mg total) by mouth 2 (two) times daily before a meal.   nystatin (MYCOSTATIN/NYSTOP) powder, Apply 1 Application topically 2 (two) times daily.   nystatin cream (MYCOSTATIN), Apply topically 3  (three) times daily.   dicyclomine (BENTYL) 10 MG capsule, Take 1 capsule (10 mg total) by mouth 3 (three) times daily between meals as needed for spasms.  Medical History:  Past Medical History:  Diagnosis Date   Allergic rhinitis    Arthritis    Asthma    mild no inaler use   GERD (gastroesophageal reflux disease)    Headache(784.0)    Migraines   History of adenomatous polyp of colon    History of concussion    AS CHILD--  NO RESIDUAL   History of palpitations    Hypertension  Paget's disease of vulva    PONV (postoperative nausea and vomiting)    SEVERE   Wears glasses    Allergies:  Allergies  Allergen Reactions   Ibuprofen Hypertension   Naprosyn [Naproxen] Nausea Only   Other     Allergic to toothpaste- makes mouth peel Hay fever/dust mites/trees "365 allergic"    Sevoflurane Nausea And Vomiting   Azithromycin Itching and Rash   Sulfa Antibiotics Hives and Rash     Surgical History:  She  has a past surgical history that includes Simple vulvectomy (03-24-2010); Wisdom tooth extraction; PULLEY RELEASE RIGHT THUMB (03-23-2011); PULLERY RELEASE LEFT THUMB, LEFT RING FINGER/  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 (02-17-2011); Tonsillectomy (1977); Vulvectomy (Right, 01/10/2015); Vulvectomy (Right, 04/10/2015); Trigger finger release; mucoid cyst removal thumb; Vaginal hysterectomy (1989); and Vulvectomy partial (N/A, 11/07/2018). Family History:  Her family history includes Asthma in her father; Breast cancer (age of onset: 7) in her paternal grandmother; Cancer in her maternal grandmother; Colon cancer in her paternal grandmother and paternal uncle; Heart disease in her father and mother; Hypertension in her father and mother.  REVIEW OF SYSTEMS  : All other systems reviewed and negative except where noted in the History of Present Illness.  PHYSICAL EXAM: BP 120/72   Pulse 67   Ht  (1.626 m)   Wt 192 lb (87.1 kg)   BMI 32.96 kg/m   General Appearance: Well nourished, in no apparent distress. Head:   Normocephalic and atraumatic. Eyes:  sclerae anicteric,conjunctive pink  Respiratory: Respiratory effort normal, BS equal bilaterally without rales, rhonchi, wheezing. Cardio: RRR with no MRGs. Peripheral pulses intact.  Abdomen: Soft,  Obese ,active bowel sounds. mild tenderness in the LUQ. Without guarding and Without rebound. No masses. Rectal: Not evaluated Musculoskeletal: Full ROM, Normal gait. Without edema. Skin:  Dry and intact without significant lesions or rashes Neuro: Alert and  oriented x4;  No focal deficits. Psych:  Cooperative. Normal mood and affect.    Doree Albee, PA-C 11:57 AM

## 2022-09-21 ENCOUNTER — Encounter: Payer: Self-pay | Admitting: Physician Assistant

## 2022-09-21 ENCOUNTER — Ambulatory Visit: Payer: Medicare Other | Admitting: Physician Assistant

## 2022-09-21 VITALS — BP 120/72 | HR 67 | Ht 64.0 in | Wt 192.0 lb

## 2022-09-21 DIAGNOSIS — Z86010 Personal history of colonic polyps: Secondary | ICD-10-CM

## 2022-09-21 DIAGNOSIS — R1012 Left upper quadrant pain: Secondary | ICD-10-CM | POA: Diagnosis not present

## 2022-09-21 DIAGNOSIS — K219 Gastro-esophageal reflux disease without esophagitis: Secondary | ICD-10-CM | POA: Diagnosis not present

## 2022-09-21 MED ORDER — DICYCLOMINE HCL 10 MG PO CAPS: 10.0000 mg | ORAL_CAPSULE | Freq: Three times a day (TID) | ORAL | 3 refills | Status: DC | PRN

## 2022-09-21 NOTE — Patient Instructions (Addendum)
_______________________________________________________  If your blood pressure at your visit was 140/90 or greater, please contact your primary care physician to follow up on this. _______________________________________________________  If you are age 70 or older, your body mass index should be between 23-30. Your Body mass index is 32.96 kg/m. If this is out of the aforementioned range listed, please consider follow up with your Primary Care Provider. ________________________________________________________  The [place] [facility] providers would like to encourage you to use [facility] to communicate with providers for non-urgent requests or questions.  Due to long hold times on the telephone, sending your provider a message by [facility] may be a faster and more efficient way to get a response.  Please allow 48 business hours for a response.  Please remember that this is for non-urgent requests.  _______________________________________________________  [name] will follow up in our office on an as needed basis.  Do trial of salon pas patches over the counter for pain, on for 12 hours and off for 12 hours, it will either help or not.  Can send in an anti spasm medication, Bentyl, to take as needed  Due for colonoscopy 07/2025 unless you have abnormal symptoms.     Please try low FODMAP diet- see below- start with eliminating just one column at a time, the table at the very bottom contains foods that are safe to take   FODMAP stands for fermentable oligo-, di-, mono-saccharides and polyols (1). These are the scientific terms used to classify groups of carbs that are notorious for triggering digestive symptoms like bloating, gas and stomach pain.     Thank you for entrusting me with your care and choosing [facility].  Quentin Mulling, PA-C

## 2022-09-23 ENCOUNTER — Ambulatory Visit (INDEPENDENT_AMBULATORY_CARE_PROVIDER_SITE_OTHER): Payer: Medicare Other

## 2022-09-23 VITALS — Ht 63.75 in | Wt 190.0 lb

## 2022-09-23 DIAGNOSIS — Z0000 Encounter for general adult medical examination without abnormal findings: Secondary | ICD-10-CM

## 2022-09-23 NOTE — Patient Instructions (Signed)
[name] , Thank you for taking time to come for your Medicare Wellness Visit. I appreciate your ongoing commitment to your health goals. Please review the following plan we discussed and let me know if I can assist you in the future.   These are the goals we discussed:  Goals      Patient Stated     03/20/2019, I will start walking more daily and increase exercise.      Patient Stated     06/18/2020, I will maintain and continue medications as prescribed.      Patient Stated     Would like to get outside and walk more     Patient Stated     Walk more and lose 10 pounds.        This is a list of the screening recommended for you and due dates:  Health Maintenance  Topic Date Due   COVID-19 Vaccine (1) Never done   Zoster (Shingles) Vaccine (1 of 2) Never done   Flu Shot  01/06/2023   Mammogram  02/12/2023   DTaP/Tdap/Td vaccine (3 - Td or Tdap) 05/03/2023   Medicare Annual Wellness Visit  09/23/2023   Colon Cancer Screening  07/30/2025   Pneumonia Vaccine  Completed   DEXA scan (bone density measurement)  Completed   Hepatitis C Screening: USPSTF Recommendation to screen - Ages 63-79 yo.  Completed   HPV Vaccine  Aged Out    Advanced directives: none  Conditions/risks identified: none  Next appointment: Follow up in one year for your annual wellness visit 09/27/23 @ 8:45 televisit   Preventive Care 65 Years and Older, Female Preventive care refers to lifestyle choices and visits with your health care provider that can promote health and wellness. What does preventive care include? A yearly physical exam. This is also called an annual well check. Dental exams once or twice a year. Routine eye exams. Ask your health care provider how often you should have your eyes checked. Personal lifestyle choices, including: Daily care of your teeth and gums. Regular physical activity. Eating a healthy diet. Avoiding tobacco and drug use. Limiting alcohol use. Practicing  safe sex. Taking low-dose aspirin every day. Taking vitamin and mineral supplements as recommended by your health care provider. What happens during an annual well check? The services and screenings done by your health care provider during your annual well check will depend on your age, overall health, lifestyle risk factors, and family history of disease. Counseling  Your health care provider may ask you questions about your: Alcohol use. Tobacco use. Drug use. Emotional well-being. Home and relationship well-being. Sexual activity. Eating habits. History of falls. Memory and ability to understand (cognition). Work and work Astronomer. Reproductive health. Screening  You may have the following tests or measurements: Height, weight, and BMI. Blood pressure. Lipid and cholesterol levels. These may be checked every 5 years, or more frequently if you are over 35 years old. Skin check. Lung cancer screening. You may have this screening every year starting at age 52 if you have a 30-pack-year history of smoking and currently smoke or have quit within the past 15 years. Fecal occult blood test (FOBT) of the stool. You may have this test every year starting at age 69. Flexible sigmoidoscopy or colonoscopy. You may have a sigmoidoscopy every 5 years or a colonoscopy every 10 years starting at age 87. Hepatitis C blood test. Hepatitis B blood test. Sexually transmitted disease (STD) testing. Diabetes screening. This is done by  checking your blood sugar (glucose) after you have not eaten for a while (fasting). You may have this done every 1-3 years. Bone density scan. This is done to screen for osteoporosis. You may have this done starting at age 29. Mammogram. This may be done every 1-2 years. Talk to your health care provider about how often you should have regular mammograms. Talk with your health care provider about your test results, treatment options, and if necessary, the need for more  tests. Vaccines  Your health care provider may recommend certain vaccines, such as: Influenza vaccine. This is recommended every year. Tetanus, diphtheria, and acellular pertussis (Tdap, Td) vaccine. You may need a Td booster every 10 years. Zoster vaccine. You may need this after age 67. Pneumococcal 13-valent conjugate (PCV13) vaccine. One dose is recommended after age 5. Pneumococcal polysaccharide (PPSV23) vaccine. One dose is recommended after age 29. Talk to your health care provider about which screenings and vaccines you need and how often you need them. This information is not intended to replace advice given to you by your health care provider. Make sure you discuss any questions you have with your health care provider. Document Released: 06/20/2015 Document Revised: 02/11/2016 Document Reviewed: 03/25/2015 Elsevier Interactive Patient Education  2017 [place] Prevention in the Home Falls can cause injuries. They can happen to people of all ages. There are many things you can do to make your home safe and to help prevent falls. What can I do on the outside of my home? Regularly fix the edges of walkways and driveways and fix any cracks. Remove anything that might make you trip as you walk through a door, such as a raised step or threshold. Trim any bushes or trees on the path to your home. Use bright outdoor lighting. Clear any walking paths of anything that might make someone trip, such as rocks or tools. Regularly check to see if handrails are loose or broken. Make sure that both sides of any steps have handrails. Any raised decks and porches should have guardrails on the edges. Have any leaves, snow, or ice cleared regularly. Use sand or salt on walking paths during winter. Clean up any spills in your garage right away. This includes oil or grease spills. What can I do in the bathroom? Use night lights. Install grab bars by the toilet and in the tub and shower.  Do not use towel bars as grab bars. Use non-skid mats or decals in the tub or shower. If you need to sit down in the shower, use a plastic, non-slip stool. Keep the floor dry. Clean up any water that spills on the floor as soon as it happens. Remove soap buildup in the tub or shower regularly. Attach bath mats securely with double-sided non-slip rug tape. Do not have throw rugs and other things on the floor that can make you trip. What can I do in the bedroom? Use night lights. Make sure that you have a light by your bed that is easy to reach. Do not use any sheets or blankets that are too big for your bed. They should not hang down onto the floor. Have a firm chair that has side arms. You can use this for support while you get dressed. Do not have throw rugs and other things on the floor that can make you trip. What can I do in the kitchen? Clean up any spills right away. Avoid walking on wet floors. Keep items that you use a  lot in easy-to-reach places. If you need to reach something above you, use a strong step stool that has a grab bar. Keep electrical cords out of the way. Do not use floor polish or wax that makes floors slippery. If you must use wax, use non-skid floor wax. Do not have throw rugs and other things on the floor that can make you trip. What can I do with my stairs? Do not leave any items on the stairs. Make sure that there are handrails on both sides of the stairs and use them. Fix handrails that are broken or loose. Make sure that handrails are as long as the stairways. Check any carpeting to make sure that it is firmly attached to the stairs. Fix any carpet that is loose or worn. Avoid having throw rugs at the top or bottom of the stairs. If you do have throw rugs, attach them to the floor with carpet tape. Make sure that you have a light switch at the top of the stairs and the bottom of the stairs. If you do not have them, ask someone to add them for you. What else  can I do to help prevent falls? Wear shoes that: Do not have high heels. Have rubber bottoms. Are comfortable and fit you well. Are closed at the toe. Do not wear sandals. If you use a stepladder: Make sure that it is fully opened. Do not climb a closed stepladder. Make sure that both sides of the stepladder are locked into place. Ask someone to hold it for you, if possible. Clearly mark and make sure that you can see: Any grab bars or handrails. First and last steps. Where the edge of each step is. Use tools that help you move around (mobility aids) if they are needed. These include: Canes. Walkers. Scooters. Crutches. Turn on the lights when you go into a dark area. Replace any light bulbs as soon as they burn out. Set up your furniture so you have a clear path. Avoid moving your furniture around. If any of your floors are uneven, fix them. If there are any pets around you, be aware of where they are. Review your medicines with your doctor. Some medicines can make you feel dizzy. This can increase your chance of falling. Ask your doctor what other things that you can do to help prevent falls. This information is not intended to replace advice given to you by your health care provider. Make sure you discuss any questions you have with your health care provider. Document Released: 03/20/2009 Document Revised: 10/30/2015 Document Reviewed: 06/28/2014 Elsevier Interactive Patient Education  2017 [employer].

## 2022-09-23 NOTE — Progress Notes (Signed)
I connected with  [name] on 09/23/22 by a audio enabled telemedicine application and verified that I am speaking with the correct person using two identifiers.  Patient Location: Home  Provider Location: Office/Clinic  I discussed the limitations of evaluation and management by telemedicine. The patient expressed understanding and agreed to proceed.  Subjective:   [name] is a 72 y.o. female who presents for Medicare Annual (Subsequent) preventive examination.  Review of Systems      Cardiac Risk Factors include: advanced age (>33men, >78 women);hypertension     Objective:    Today's Vitals   09/23/22 0900  Weight: 190 lb (86.2 kg)  Height: 5' 3.75" (1.619 m)   Body mass index is 32.87 kg/m.     09/23/2022    9:16 AM 06/19/2021   10:31 AM 08/08/2020    3:17 PM 06/18/2020   10:32 AM 07/23/2019   10:11 AM 03/20/2019    9:53 AM 12/13/2018   11:13 AM  Advanced Directives  Does Patient Have a Medical Advance Directive? Yes No No No No No No  Type of Estate agent of [place];Living will        Copy of Healthcare Power of Attorney in Chart? No - copy requested        Would patient like information on creating a medical advance directive?  Yes (MAU/Ambulatory/Procedural Areas - Information given) No - Patient declined No - Patient declined No - Patient declined No - Patient declined No - Patient declined    Current Medications (verified) Outpatient Encounter Medications as of 09/23/2022  Medication Sig   albuterol (VENTOLIN HFA) 108 (90 Base) MCG/ACT inhaler INHALE 2 PUFFS INTO THE LUNGS EVERY 6 HOURS AS NEEDED FOR WHEEZING OR SHORTNESS OF BREATH   alendronate (FOSAMAX) 70 MG tablet Take 1 tablet (70 mg total) by mouth every 7 (seven) days. Take with a full glass of water on an empty stomach.   atorvastatin (LIPITOR) 10 MG tablet TAKE 1 TABLET(10 MG) BY MOUTH DAILY   dicyclomine (BENTYL) 10 MG capsule Take 1 capsule (10 mg total) by mouth 3 (three)  times daily between meals as needed for spasms.   famotidine (PEPCID) 20 MG tablet Take 1 tablet (20 mg total) by mouth 2 (two) times daily.   fexofenadine (ALLERGY RELIEF) 180 MG tablet Take 1 tablet (180 mg total) by mouth daily as needed for allergies or rhinitis (Can take an extra dose during flare ups.).   fluticasone (FLOVENT HFA) 110 MCG/ACT inhaler SMARTSIG:2 Puff(s) By Mouth Twice Daily   ipratropium (ATROVENT) 0.03 % nasal spray INSTILL 1-2 SPRAYS IN EACH NOSTRIL TWICE DAILY AS NEEDED   lansoprazole (PREVACID) 15 MG capsule Take 1 capsule (15 mg total) by mouth 2 (two) times daily before a meal.   losartan-hydrochlorothiazide (HYZAAR) 100-25 MG tablet TAKE 1 TABLET BY MOUTH EVERY DAY   montelukast (SINGULAIR) 10 MG tablet Take 1 tablet (10 mg total) by mouth at bedtime.   nystatin (MYCOSTATIN/NYSTOP) powder Apply 1 Application topically 2 (two) times daily.   nystatin cream (MYCOSTATIN) Apply topically 3 (three) times daily.   triamcinolone cream (KENALOG) 0.1 % Apply topically 2 (two) times daily.   cyclobenzaprine (FLEXERIL) 5 MG tablet TAKE 1 TO 2 TABLETS(5 TO 10 MG) BY MOUTH TWICE DAILY AS NEEDED FOR MUSCLE SPASMS (Patient not taking: Reported on 09/23/2022)   imiquimod (ALDARA) 5 % cream Apply topically 3 (three) times a week. Apply to right vulva 3 times a week, use for 12 weeks (  Patient not taking: Reported on 09/23/2022)   No facility-administered encounter medications on file as of 09/23/2022.    Allergies (verified) Ibuprofen, Naprosyn [naproxen], Other, Sevoflurane, Azithromycin, and Sulfa antibiotics   History: Past Medical History:  Diagnosis Date   Allergic rhinitis    Arthritis    Asthma    mild no inaler use   GERD (gastroesophageal reflux disease)    Headache(784.0)    Migraines   History of adenomatous polyp of colon    History of concussion    AS CHILD--  NO RESIDUAL   History of palpitations    Hypertension    Paget's disease of vulva    PONV  (postoperative nausea and vomiting)    SEVERE   Wears glasses    Past Surgical History:  Procedure Laterality Date   mucoid cyst removal thumb     PULLERY RELEASE LEFT THUMB, LEFT RING FINGER/  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  02-17-2011   PULLEY RELEASE RIGHT THUMB  03-23-2011   SIMPLE VULVECTOMY  03-24-2010   right side   TONSILLECTOMY  1977   TRIGGER FINGER RELEASE     VAGINAL HYSTERECTOMY  1989   and Anterior and posterior repair's for prolapse partial   VULVECTOMY Right 01/10/2015   Procedure: RIGHT WIDE LOCAL EXCISION VULVECTOMY;  Surgeon: De Blanch, MD;  Location: [facility];  Service: Gynecology;  Laterality: Right;   VULVECTOMY Right 04/10/2015   Procedure: WIDE LOCAL EXCISION VULVA;  Surgeon: De Blanch, MD;  Location: [facility];  Service: Gynecology;  Laterality: Right;   VULVECTOMY PARTIAL N/A 11/07/2018   Procedure: VULVECTOMY PARTIAL;  Surgeon: Cleda Mccreedy, MD;  Location: [facility];  Service: Gynecology;  Laterality: N/A;   WISDOM TOOTH EXTRACTION     Family History  Problem Relation Age of Onset   Cancer Maternal Grandmother    Hypertension Father    Heart disease Father    Asthma Father    Hypertension Mother    Heart disease Mother    Breast cancer Paternal Grandmother 44   Colon cancer Paternal Grandmother    Colon cancer Paternal Uncle    Allergic rhinitis Neg Hx    Angioedema Neg Hx    Eczema Neg Hx    Immunodeficiency Neg Hx    Urticaria Neg Hx    Esophageal cancer Neg Hx    Rectal cancer Neg Hx    Stomach cancer Neg Hx    Social History   Socioeconomic History   Marital status: Married    Spouse name: Not on file   Number of children: 3   Years of education: Not on file   Highest education level: Not on file  Occupational History   Occupation: retired  Tobacco Use   Smoking status: Former    Years: 5    Types: Cigarettes    Quit date:  04/15/1989    Years since quitting: 33.4   Smokeless tobacco: Never  Vaping Use   Vaping Use: Never used  Substance and Sexual Activity   Alcohol use: Not Currently    Alcohol/week: 1.0 standard drink of alcohol    Types: 1 Glasses of wine per week    Comment: occasional wine   Drug use: No   Sexual activity: Not on file  Other Topics Concern   Not on file  Social History Narrative   She is married.  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   Occasional wine, former smoker not now  no substances   3 kids plus two step daughters   Social Determinants of Health   Financial Resource Strain: Low Risk  (06/19/2021)   Overall Financial Resource Strain (CARDIA)    Difficulty of Paying Living Expenses: Not hard at all  Food Insecurity: No Food Insecurity (09/23/2022)   Hunger Vital Sign    Worried About Running Out of Food in the Last Year: Never true    Ran Out of Food in the Last Year: Never true  Transportation Needs: No Transportation Needs (09/23/2022)   PRAPARE - Administrator, Civil Service (Medical): No    Lack of Transportation (Non-Medical): No  Physical Activity: Insufficiently Active (09/23/2022)   Exercise Vital Sign    Days of Exercise per Week: 3 days    Minutes of Exercise per Session: 20 min  Stress: No Stress Concern Present (09/23/2022)   [employer] of Occupational Health - Occupational Stress Questionnaire    Feeling of Stress : Not at all  Social Connections: Socially Integrated (09/23/2022)   Social Connection and Isolation Panel [NHANES]    Frequency of Communication with Friends and Family: More than three times a week    Frequency of Social Gatherings with Friends and Family: More than three times a week    Attends Religious Services: More than 4 times per year    Active Member of [employer] or Organizations: Yes    Attends Engineer, structural: More than 4 times per year    Marital Status: Married    Tobacco Counseling Counseling  given: Not Answered   Clinical Intake:  Pre-visit preparation completed: Yes  Pain : No/denies pain     Nutritional Risks: None Diabetes: No  How often do you need to have someone help you when you read instructions, pamphlets, or other written materials from your doctor or pharmacy?: 1 - Never  Diabetic? no  Interpreter Needed?: No  Information entered by :: C.Shelby Anderle LPN   Activities of Daily Living    09/23/2022    9:16 AM  In your present state of health, do you have any difficulty performing the following activities:  Hearing? 0  Vision? 0  Difficulty concentrating or making decisions? 0  Walking or climbing stairs? 0  Dressing or bathing? 0  Doing errands, shopping? 0  Preparing Food and eating ? N  Using the Toilet? N  In the past six months, have you accidently leaked urine? Y  Comment occasionally if sneezes  Do you have problems with loss of bowel control? N  Managing your Medications? N  Managing your Finances? N  Housekeeping or managing your Housekeeping? N    Patient Care Team: Excell Seltzer, MD as PCP - General  Indicate any recent Medical Services you may have received from other than [facility] providers in the past year (date may be approximate).     Assessment:   This is a routine wellness examination for Trinh.  Hearing/Vision screen Hearing Screening - Comments:: No aids Vision Screening - Comments:: Glasses - [facility]  Dietary issues and exercise activities discussed: Current Exercise Habits: Home exercise routine, Type of exercise: walking, Time (Minutes): 20, Frequency (Times/Week): 3, Weekly Exercise (Minutes/Week): 60, Exercise limited by: None identified   Goals Addressed             This Visit's Progress    Patient Stated       Walk more and lose 10 pounds.       Depression Screen  09/23/2022    9:14 AM 08/17/2022    3:16 PM 07/01/2022    4:27 PM 05/21/2022    8:03 AM 10/15/2021    2:09 PM 06/19/2021   10:35 AM  06/18/2020   10:34 AM  PHQ 2/9 Scores  PHQ - 2 Score 0 0 0 0 0 0 0  PHQ- 9 Score   0    0    Fall Risk    09/23/2022    9:16 AM 08/17/2022    3:16 PM 07/01/2022    4:27 PM 05/21/2022    8:02 AM 10/15/2021    2:09 PM  Fall Risk   Falls in the past year? 0 0 0 0 0  Number falls in past yr: 0 0 0 0   Injury with Fall? 0 0 0 0   Risk for fall due to : No Fall Risks No Fall Risks No Fall Risks No Fall Risks   Follow up Falls prevention discussed;Falls evaluation completed Falls evaluation completed Falls evaluation completed Falls evaluation completed Falls evaluation completed    FALL RISK PREVENTION PERTAINING TO THE HOME:  Any stairs in or around the home? Yes  If so, are there any without handrails? Yes  Home free of loose throw rugs in walkways, pet beds, electrical cords, etc? Yes  Adequate lighting in your home to reduce risk of falls? Yes   ASSISTIVE DEVICES UTILIZED TO PREVENT FALLS:  Life alert? No  Use of a cane, walker or w/c? No  Grab bars in the bathroom? No  Shower chair or bench in shower? Yes  Elevated toilet seat or a handicapped toilet? No   Cognitive Function:    06/18/2020   10:37 AM 03/20/2019   10:00 AM  MMSE - Mini Mental State Exam  Orientation to time 5 5  Orientation to Place 5 5  Registration 3 3  Attention/ Calculation 5 5  Recall 3 3  Language- repeat 1 1        09/23/2022    9:19 AM  6CIT Screen  What Year? 0 points  What month? 0 points  What time? 0 points  Count back from 20 0 points  Months in reverse 0 points  Repeat phrase 0 points  Total Score 0 points    Immunizations Immunization History  Administered Date(s) Administered   Fluad Quad(high Dose 65+) 04/17/2019, 06/27/2020, 02/18/2022   Influenza,inj,Quad PF,6+ Mos 03/13/2015, 04/09/2016, 06/17/2017, 04/07/2018   Pneumococcal Conjugate-13 04/17/2019   Pneumococcal Polysaccharide-23 06/27/2020   Td 12/06/2006   Tdap 05/02/2013   Zoster, Live 04/09/2016    TDAP  status: Due, Education has been provided regarding the importance of this vaccine. Advised may receive this vaccine at local pharmacy or Health Dept. Aware to provide a copy of the vaccination record if obtained from local pharmacy or Health Dept. Verbalized acceptance and understanding.  Flu Vaccine status: Up to date  Pneumococcal vaccine status: Up to date  Covid-19 vaccine status: Declined, Education has been provided regarding the importance of this vaccine but patient still declined. Advised may receive this vaccine at local pharmacy or Health Dept.or vaccine clinic. Aware to provide a copy of the vaccination record if obtained from local pharmacy or Health Dept. Verbalized acceptance and understanding.  Qualifies for Shingles Vaccine? Yes   Zostavax completed Yes   Shingrix Completed?: No.    Education has been provided regarding the importance of this vaccine. Patient has been advised to call insurance company to determine out of pocket expense if  they have not yet received this vaccine. Advised may also receive vaccine at local pharmacy or Health Dept. Verbalized acceptance and understanding.  Screening Tests Health Maintenance  Topic Date Due   COVID-19 Vaccine (1) Never done   Zoster Vaccines- Shingrix (1 of 2) Never done   INFLUENZA VACCINE  01/06/2023   MAMMOGRAM  02/12/2023   DTaP/Tdap/Td (3 - Td or Tdap) 05/03/2023   Medicare Annual Wellness (AWV)  09/23/2023   COLONOSCOPY (Pts 45-34yrs Insurance coverage will need to be confirmed)  07/30/2025   Pneumonia Vaccine 81+ Years old  Completed   DEXA SCAN  Completed   Hepatitis C Screening  Completed   HPV VACCINES  Aged Out    Health Maintenance  Health Maintenance Due  Topic Date Due   COVID-19 Vaccine (1) Never done   Zoster Vaccines- Shingrix (1 of 2) Never done    Colorectal cancer screening: Type of screening: Colonoscopy. Completed 07/31/2015. Repeat every 10 years  Mammogram status: Completed 02/11/2022. Repeat  every year  Bone Density status: Completed 02/11/2022. Results reflect: Bone density results: OSTEOPENIA. Repeat every 2 years.  Lung Cancer Screening: (Low Dose CT Chest recommended if Age 30-80 years, 30 pack-year currently smoking OR have quit w/in 15years.) does not qualify.   Lung Cancer Screening Referral: no  Additional Screening:  Hepatitis C Screening: does not qualify; Completed 04/09/16  Vision Screening: Recommended annual ophthalmology exams for early detection of glaucoma and other disorders of the eye. Is the patient up to date with their annual eye exam?   pt will scheduled appointment. Who is the provider or what is the name of the office in which the patient attends annual eye exams? [facility] If pt is not established with a provider, would they like to be referred to a provider to establish care? No .   Dental Screening: Recommended annual dental exams for proper oral hygiene  Community Resource Referral / Chronic Care Management: CRR required this visit?  No   CCM required this visit?  No      Plan:     I have personally reviewed and noted the following in the patient's chart:   Medical and social history Use of alcohol, tobacco or illicit drugs  Current medications and supplements including opioid prescriptions. Patient is not currently taking opioid prescriptions. Functional ability and status Nutritional status Physical activity Advanced directives List of other physicians Hospitalizations, surgeries, and ER visits in previous 12 months Vitals Screenings to include cognitive, depression, and falls Referrals and appointments  In addition, I have reviewed and discussed with patient certain preventive protocols, quality metrics, and best practice recommendations. A written personalized care plan for preventive services as well as general preventive health recommendations were provided to patient.     Maryan Puls, LPN   1/61/0960   Nurse  Notes: Pt c/o having vivid dreams that seem real and daughter is concerned. Advised pt to schedule appointment with PCP to discuss.

## 2022-10-04 ENCOUNTER — Telehealth: Payer: Self-pay | Admitting: *Deleted

## 2022-10-04 DIAGNOSIS — E7800 Pure hypercholesterolemia, unspecified: Secondary | ICD-10-CM

## 2022-10-04 DIAGNOSIS — D649 Anemia, unspecified: Secondary | ICD-10-CM

## 2022-10-04 NOTE — Telephone Encounter (Signed)
-----   Message from Alvina Chou sent at 10/04/2022  3:35 PM EDT ----- Regarding: Lab orders for Tuesday, 5.7.24 Patient is scheduled for CPX labs, please order future labs, Thanks , Camelia Eng

## 2022-10-11 DIAGNOSIS — M9901 Segmental and somatic dysfunction of cervical region: Secondary | ICD-10-CM | POA: Diagnosis not present

## 2022-10-11 DIAGNOSIS — M9903 Segmental and somatic dysfunction of lumbar region: Secondary | ICD-10-CM | POA: Diagnosis not present

## 2022-10-11 DIAGNOSIS — M5442 Lumbago with sciatica, left side: Secondary | ICD-10-CM | POA: Diagnosis not present

## 2022-10-11 DIAGNOSIS — M531 Cervicobrachial syndrome: Secondary | ICD-10-CM | POA: Diagnosis not present

## 2022-10-12 ENCOUNTER — Other Ambulatory Visit (INDEPENDENT_AMBULATORY_CARE_PROVIDER_SITE_OTHER): Payer: Medicare Other

## 2022-10-12 DIAGNOSIS — D649 Anemia, unspecified: Secondary | ICD-10-CM

## 2022-10-12 DIAGNOSIS — E7800 Pure hypercholesterolemia, unspecified: Secondary | ICD-10-CM | POA: Diagnosis not present

## 2022-10-12 LAB — COMPREHENSIVE METABOLIC PANEL
ALT: 21 U/L (ref 0–35)
AST: 19 U/L (ref 0–37)
Albumin: 4 g/dL (ref 3.5–5.2)
Alkaline Phosphatase: 41 U/L (ref 39–117)
BUN: 9 mg/dL (ref 6–23)
CO2: 31 mEq/L (ref 19–32)
Calcium: 9.3 mg/dL (ref 8.4–10.5)
Chloride: 101 mEq/L (ref 96–112)
Creatinine, Ser: 0.82 mg/dL (ref 0.40–1.20)
GFR: 72.82 mL/min (ref >60.00)
Glucose, Bld: 92 mg/dL (ref 70–99)
Potassium: 3.6 mEq/L (ref 3.5–5.1)
Sodium: 142 mEq/L (ref 135–145)
Total Bilirubin: 0.5 mg/dL (ref 0.2–1.2)
Total Protein: 6.8 g/dL (ref 6.0–8.3)

## 2022-10-12 LAB — IBC + FERRITIN
Ferritin: 54 ng/mL (ref 10.0–291.0)
Iron: 58 ug/dL (ref 42–145)
Saturation Ratios: 20.3 % (ref 20.0–50.0)
TIBC: 285.6 ug/dL (ref 250.0–450.0)
Transferrin: 204 mg/dL — ABNORMAL LOW (ref 212.0–360.0)

## 2022-10-12 LAB — CBC WITH DIFFERENTIAL/PLATELET
Basophils Absolute: 0.1 10*3/uL (ref 0.0–0.1)
Basophils Relative: 0.7 % (ref 0.0–3.0)
Eosinophils Absolute: 0.3 10*3/uL (ref 0.0–0.7)
Eosinophils Relative: 3.1 % (ref 0.0–5.0)
HCT: 37.8 % (ref 36.0–46.0)
Hemoglobin: 12.9 g/dL (ref 12.0–15.0)
Lymphocytes Relative: 36.9 % (ref 12.0–46.0)
Lymphs Abs: 3 10*3/uL (ref 0.7–4.0)
MCHC: 34.3 g/dL (ref 30.0–36.0)
MCV: 93.1 fl (ref 78.0–100.0)
Monocytes Absolute: 0.6 10*3/uL (ref 0.1–1.0)
Monocytes Relative: 7.8 % (ref 3.0–12.0)
Neutro Abs: 4.2 10*3/uL (ref 1.4–7.7)
Neutrophils Relative %: 51.5 % (ref 43.0–77.0)
Platelets: 256 10*3/uL (ref 150.0–400.0)
RBC: 4.06 Mil/uL (ref 3.87–5.11)
RDW: 12.8 % (ref 11.5–15.5)
WBC: 8.2 10*3/uL (ref 4.0–10.5)

## 2022-10-12 LAB — LIPID PANEL
Cholesterol: 156 mg/dL (ref 0–200)
HDL: 68.1 mg/dL (ref >39.00)
LDL Cholesterol: 70 mg/dL (ref 0–99)
NonHDL: 87.8
Total CHOL/HDL Ratio: 2
Triglycerides: 89 mg/dL (ref 0.0–149.0)
VLDL: 17.8 mg/dL (ref 0.0–40.0)

## 2022-10-12 NOTE — Progress Notes (Signed)
No critical labs need to be addressed urgently. We will discuss labs in detail at upcoming office visit.   

## 2022-10-19 ENCOUNTER — Encounter: Payer: Self-pay | Admitting: Family Medicine

## 2022-10-19 ENCOUNTER — Ambulatory Visit (INDEPENDENT_AMBULATORY_CARE_PROVIDER_SITE_OTHER): Payer: Medicare Other | Admitting: Family Medicine

## 2022-10-19 VITALS — BP 120/62 | HR 64 | Temp 97.3°F | Ht 63.66 in | Wt 193.5 lb

## 2022-10-19 DIAGNOSIS — R1012 Left upper quadrant pain: Secondary | ICD-10-CM

## 2022-10-19 DIAGNOSIS — G8929 Other chronic pain: Secondary | ICD-10-CM

## 2022-10-19 DIAGNOSIS — J329 Chronic sinusitis, unspecified: Secondary | ICD-10-CM | POA: Diagnosis not present

## 2022-10-19 DIAGNOSIS — I10 Essential (primary) hypertension: Secondary | ICD-10-CM | POA: Diagnosis not present

## 2022-10-19 DIAGNOSIS — E7800 Pure hypercholesterolemia, unspecified: Secondary | ICD-10-CM

## 2022-10-19 DIAGNOSIS — Z0000 Encounter for general adult medical examination without abnormal findings: Secondary | ICD-10-CM

## 2022-10-19 MED ORDER — AMOXICILLIN-POT CLAVULANATE 875-125 MG PO TABS: 1.0000 | ORAL_TABLET | Freq: Two times a day (BID) | ORAL | 0 refills | Status: DC

## 2022-10-19 NOTE — Assessment & Plan Note (Signed)
Stable, chronic.   Losartan/HCTZ 100/25 mg daily

## 2022-10-19 NOTE — Assessment & Plan Note (Signed)
Stable, chronic.  Continue current medication. ° ° ° atorvastatin 10 mg daily °

## 2022-10-19 NOTE — Progress Notes (Signed)
Patient ID: [name], female    DOB: 05/02/1951, 72 y.o.   MRN: [id number]  This visit was conducted in person.  BP 120/62 (BP Location: Left Arm, Patient Position: Sitting, Cuff Size: Large)   Pulse 64   Temp (!) 97.3 F (36.3 C) (Temporal)   Ht 5' 3.66" (1.617 m)   Wt 193 lb 8 oz (87.8 kg)   SpO2 97%   BMI 33.57 kg/m    CC:  Chief Complaint  Patient presents with   Annual Exam    Part 2    Subjective:   HPI: [name] is a 72 y.o. female presenting on 10/19/2022 for Annual Exam (Part 2) The patient presents for annual medicare wellness, complete physical and review of chronic health problems. He/She also has the following acute concerns today:   Feels like 08/16/2021 sinus infection not resolved completelya fter augmentin;. Using ipratropium nasal spray, flonase 2 sprays   Greenish  bloody nasal discharge.   The patient saw a LPN or RN for medicare wellness visit. 09/23/2022  Prevention and wellness was reviewed in detail. Note reviewed and important notes copied below.  Hypertension: Well-controlled on metoprolol 12.5 mg p.o. twice daily ( had some palpitations associated) On losartan HCTZ 100./25 1 tablet daily. Has asthma and chronic cough.. could BBlocker be worsening this? BP Readings from Last 3 Encounters:  10/19/22 120/62  09/21/22 120/72  08/17/22 123/80  Using medication without problems or lightheadedness:  none Chest pain with exertion: none Edema: none Short of breath: none..Average home BPs: Other issues:  Elevated Cholesterol: LDL at goal on atorvastatin 10 mg p.o. daily Lab Results  Component Value Date   CHOL 156 10/12/2022   HDL 68.10 10/12/2022   LDLCALC 70 10/12/2022   LDLDIRECT 122.5 07/14/2009   TRIG 89.0 10/12/2022   CHOLHDL 2 10/12/2022  Using medications without problems: none Muscle aches:  none Diet compliance  good Exercise: walking 2 miles several days a week The 10-year ASCVD risk score (Arnett DK, et al., 2019) is:  8.8%   Values used to calculate the score:     Age: 86 years     Sex: Female     Is Non-Hispanic African American: No     Diabetic: No     Tobacco smoker: No     Systolic Blood Pressure: 120 mmHg     Is BP treated: Yes     HDL Cholesterol: 68.1 mg/dL     Total Cholesterol: 156 mg/dL  Other complaints:  She has been seeing ENT Dr. Pollyann Kennedy for pulsatile tinnitus of right ear 5/4 MR neck:   IMPRESSION: 1. Unremarkable MR angiogram of the neck. 2. Limited evaluation of the intracranial circulation show a 2 mm outpouching from the proximal M1 segment of the left MCA, may represent a small aneurysm. Correlation with MR angiogram of the brain is recommended.  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  Relevant past medical, surgical, family and social history reviewed and updated as indicated. Interim medical history since our last visit reviewed. Allergies and medications reviewed and updated. Outpatient Medications Prior to Visit  Medication Sig Dispense Refill   albuterol (VENTOLIN HFA) 108 (90 Base) MCG/ACT inhaler INHALE 2 PUFFS INTO THE LUNGS EVERY 6 HOURS AS NEEDED FOR WHEEZING OR SHORTNESS OF BREATH 18 g 1   alendronate (FOSAMAX) 70 MG tablet Take 1 tablet (70 mg total) by mouth every 7 (seven) days. Take with a full glass of water on an empty stomach. 4  tablet 11   atorvastatin (LIPITOR) 10 MG tablet TAKE 1 TABLET(10 MG) BY MOUTH DAILY 90 tablet 3   cyclobenzaprine (FLEXERIL) 5 MG tablet      dicyclomine (BENTYL) 10 MG capsule Take 1 capsule (10 mg total) by mouth 3 (three) times daily between meals as needed for spasms. 270 capsule 3   famotidine (PEPCID) 20 MG tablet Take 1 tablet (20 mg total) by mouth 2 (two) times daily. 180 tablet 1   fexofenadine (ALLERGY RELIEF) 180 MG tablet Take 1 tablet (180 mg total) by mouth daily as needed for allergies or rhinitis (Can take an extra dose during flare ups.). 180 tablet 0   fluticasone (FLOVENT HFA) 110 MCG/ACT inhaler  SMARTSIG:2 Puff(s) By Mouth Twice Daily     imiquimod (ALDARA) 5 % cream Apply topically 3 (three) times a week. Apply to right vulva 3 times a week, use for 12 weeks 12 each 1   ipratropium (ATROVENT) 0.03 % nasal spray INSTILL 1-2 SPRAYS IN EACH NOSTRIL TWICE DAILY AS NEEDED 30 mL 5   lansoprazole (PREVACID) 15 MG capsule Take 1 capsule (15 mg total) by mouth 2 (two) times daily before a meal. 180 capsule 1   losartan-hydrochlorothiazide (HYZAAR) 100-25 MG tablet TAKE 1 TABLET BY MOUTH EVERY DAY 90 tablet 3   montelukast (SINGULAIR) 10 MG tablet Take 1 tablet (10 mg total) by mouth at bedtime. 90 tablet 3   triamcinolone cream (KENALOG) 0.1 % Apply topically 2 (two) times daily.     nystatin (MYCOSTATIN/NYSTOP) powder Apply 1 Application topically 2 (two) times daily.     nystatin cream (MYCOSTATIN) Apply topically 3 (three) times daily.     No facility-administered medications prior to visit.     Per HPI unless specifically indicated in ROS section below Review of Systems  Constitutional:  Negative for fatigue and fever.  HENT:  Negative for congestion.   Eyes:  Negative for pain.  Respiratory:  Negative for cough and shortness of breath.   Cardiovascular:  Negative for chest pain, palpitations and leg swelling.  Gastrointestinal:  Negative for abdominal pain.  Genitourinary:  Negative for dysuria and vaginal bleeding.  Musculoskeletal:  Negative for back pain.  Neurological:  Negative for syncope, light-headedness and headaches.  Psychiatric/Behavioral:  Negative for dysphoric mood.    Objective:  BP 120/62 (BP Location: Left Arm, Patient Position: Sitting, Cuff Size: Large)   Pulse 64   Temp (!) 97.3 F (36.3 C) (Temporal)   Ht 5' 3.66" (1.617 m)   Wt 193 lb 8 oz (87.8 kg)   SpO2 97%   BMI 33.57 kg/m   Wt Readings from Last 3 Encounters:  10/19/22 193 lb 8 oz (87.8 kg)  09/23/22 190 lb (86.2 kg)  09/21/22 192 lb (87.1 kg)      Physical Exam Constitutional:       General: She is not in acute distress.    Appearance: Normal appearance. She is well-developed. She is not ill-appearing or toxic-appearing.  HENT:     Head: Normocephalic.     Right Ear: Hearing, tympanic membrane, ear canal and external ear normal. Tympanic membrane is not erythematous, retracted or bulging.     Left Ear: Hearing, tympanic membrane, ear canal and external ear normal. Tympanic membrane is not erythematous, retracted or bulging.     Nose: No mucosal edema or rhinorrhea.     Right Sinus: No maxillary sinus tenderness or frontal sinus tenderness.     Left Sinus: No maxillary sinus tenderness or frontal  sinus tenderness.     Mouth/Throat:     Pharynx: Uvula midline.  Eyes:     General: Lids are normal. Lids are everted, no foreign bodies appreciated.     Conjunctiva/sclera: Conjunctivae normal.     Pupils: Pupils are equal, round, and reactive to light.  Neck:     Thyroid: No thyroid mass or thyromegaly.     Vascular: No carotid bruit.     Trachea: Trachea normal.  Cardiovascular:     Rate and Rhythm: Normal rate and regular rhythm.     Pulses: Normal pulses.     Heart sounds: Normal heart sounds, S1 normal and S2 normal. No murmur heard.    No friction rub. No gallop.  Pulmonary:     Effort: Pulmonary effort is normal. No tachypnea or respiratory distress.     Breath sounds: Normal breath sounds. No decreased breath sounds, wheezing, rhonchi or rales.  Abdominal:     General: Bowel sounds are normal.     Palpations: Abdomen is soft.     Tenderness: There is no abdominal tenderness.  Musculoskeletal:     Cervical back: Normal range of motion and neck supple.  Skin:    General: Skin is warm and dry.     Findings: No rash.  Neurological:     Mental Status: She is alert.  Psychiatric:        Mood and Affect: Mood is not anxious or depressed.        Speech: Speech normal.        Behavior: Behavior normal. Behavior is cooperative.        Thought Content: Thought  content normal.        Judgment: Judgment normal.       Results for orders placed or performed in visit on 10/12/22  IBC + Ferritin  Result Value Ref Range   Iron 58 42 - 145 ug/dL   Transferrin 161.0 (L) 212.0 - 360.0 mg/dL   Saturation Ratios 96.0 20.0 - 50.0 %   Ferritin 54.0 10.0 - 291.0 ng/mL   TIBC 285.6 250.0 - 450.0 mcg/dL  Lipid panel  Result Value Ref Range   Cholesterol 156 0 - 200 mg/dL   Triglycerides 45.4 0.0 - 149.0 mg/dL   HDL 09.81 >19.14 mg/dL   VLDL 78.2 0.0 - 95.6 mg/dL   LDL Cholesterol 70 0 - 99 mg/dL   Total CHOL/HDL Ratio 2    NonHDL 87.80   CBC with Differential/Platelet  Result Value Ref Range   WBC 8.2 4.0 - 10.5 K/uL   RBC 4.06 3.87 - 5.11 Mil/uL   Hemoglobin 12.9 12.0 - 15.0 g/dL   HCT 21.3 08.6 - 57.8 %   MCV 93.1 78.0 - 100.0 fl   MCHC 34.3 30.0 - 36.0 g/dL   RDW 46.9 62.9 - 52.8 %   Platelets 256.0 150.0 - 400.0 K/uL   Neutrophils Relative % 51.5 43.0 - 77.0 %   Lymphocytes Relative 36.9 12.0 - 46.0 %   Monocytes Relative 7.8 3.0 - 12.0 %   Eosinophils Relative 3.1 0.0 - 5.0 %   Basophils Relative 0.7 0.0 - 3.0 %   Neutro Abs 4.2 1.4 - 7.7 K/uL   Lymphs Abs 3.0 0.7 - 4.0 K/uL   Monocytes Absolute 0.6 0.1 - 1.0 K/uL   Eosinophils Absolute 0.3 0.0 - 0.7 K/uL   Basophils Absolute 0.1 0.0 - 0.1 K/uL  Comprehensive metabolic panel  Result Value Ref Range   Sodium 142 135 - 145  mEq/L   Potassium 3.6 3.5 - 5.1 mEq/L   Chloride 101 96 - 112 mEq/L   CO2 31 19 - 32 mEq/L   Glucose, Bld 92 70 - 99 mg/dL   BUN 9 6 - 23 mg/dL   Creatinine, Ser 1.61 0.40 - 1.20 mg/dL   Total Bilirubin 0.5 0.2 - 1.2 mg/dL   Alkaline Phosphatase 41 39 - 117 U/L   AST 19 0 - 37 U/L   ALT 21 0 - 35 U/L   Total Protein 6.8 6.0 - 8.3 g/dL   Albumin 4.0 3.5 - 5.2 g/dL   GFR 09.60 >45.40 mL/min   Calcium 9.3 8.4 - 10.5 mg/dL     COVID 19 screen:  No recent travel or known exposure to COVID19 The patient denies respiratory symptoms of COVID 19 at this time. The  importance of social distancing was discussed today.   Assessment and Plan The patient's preventative maintenance and recommended screening tests for an annual wellness exam were reviewed in full today. Brought up to date unless services declined.  Counselled on the importance of diet, exercise, and its role in overall health and mortality. The patient's FH and SH was reviewed, including their home life, tobacco status, and drug and alcohol status.    Vaccines: Uptodate with Tdap, flu, PNA 23.  Discussed COVID19 vaccine side effects and benefits. Strongly encouraged the patient to get the vaccine. Questions answered. PAP/DVE: Followed by GYN, partial hysterectomy.. Pap not indicated. Mammo:  02/2022 unremarkable. Colon: nml 2017 Dr. Leone Payor, repeat in 10 years. Hep C : done   DEXA 02/2022  on fosamax..   Problem List Items Addressed This Visit     Abdominal pain, chronic, left upper quadrant    Chronic, negative GI evaluation.  Reviewed GI consult. Felt most likely musculoskeletal strain.  Cyclobenzaprine 5 mg p.o. twice daily has helped her. We did discuss cyclobenzaprine during the day could cause some element of sedation or confusion.  She will try to just use the daytime dose as needed.      Essential hypertension, benign (Chronic)    Stable, chronic.   Losartan/HCTZ 100/25 mg daily       Hypercholesteremia (Chronic)    Stable, chronic.  Continue current medication.    atorvastatin 10 mg daily      Recurrent sinus infections    Acute on chronic, incomplete resolution of sinus infection from March 2024.  She continues to use allergy medication.  I encouraged her to start nasal saline irrigation or spray.  She will complete an additional course of Augmentin 875 p.o. twice daily x 10 days.  If symptoms are still not resolving we can consider further evaluation such as imaging.      Relevant Medications   amoxicillin-clavulanate (AUGMENTIN) 875-125 MG tablet   Other Visit  Diagnoses     Routine general medical examination at a health care facility    -  Primary        Kerby Nora, MD

## 2022-10-19 NOTE — Assessment & Plan Note (Signed)
Chronic, negative GI evaluation.  Reviewed GI consult. Felt most likely musculoskeletal strain.  Cyclobenzaprine 5 mg p.o. twice daily has helped her. We did discuss cyclobenzaprine during the day could cause some element of sedation or confusion.  She will try to just use the daytime dose as needed.

## 2022-10-19 NOTE — Assessment & Plan Note (Signed)
Acute on chronic, incomplete resolution of sinus infection from March 2024.  She continues to use allergy medication.  I encouraged her to start nasal saline irrigation or spray.  She will complete an additional course of Augmentin 875 p.o. twice daily x 10 days.  If symptoms are still not resolving we can consider further evaluation such as imaging.

## 2022-10-23 ENCOUNTER — Other Ambulatory Visit: Payer: Self-pay | Admitting: Allergy

## 2022-11-02 ENCOUNTER — Telehealth: Payer: Self-pay | Admitting: *Deleted

## 2022-11-02 NOTE — Telephone Encounter (Signed)
-----   Message from Doylene Bode, NP sent at 11/02/2022 11:45 AM EDT ----- This patient last saw Dr. Pricilla Holm in 2022. You can make her a follow up appt whenever there is a spot, no rush

## 2022-11-02 NOTE — Telephone Encounter (Signed)
Ms. Ellingham notified and relayed message from Warner Mccreedy, NP.  Patient states she has a white spot the size of a dime on her vulva and it doesn't hurt. Denies itching or drainage. Patient states, "sometimes it feels like a pin prick". Patient was given an appointment with Dr. Pricilla Holm on July 11, at 0830. Pt agreed to date and time and advised to call the office with any increased symptoms, concerns or questions.

## 2022-11-05 ENCOUNTER — Other Ambulatory Visit: Payer: Self-pay | Admitting: Family Medicine

## 2022-11-05 ENCOUNTER — Telehealth: Payer: Self-pay | Admitting: Family Medicine

## 2022-11-05 MED ORDER — LOSARTAN POTASSIUM-HCTZ 100-25 MG PO TABS: 1.0000 | ORAL_TABLET | Freq: Every day | ORAL | 0 refills | Status: DC

## 2022-11-05 NOTE — Telephone Encounter (Signed)
Noted  

## 2022-11-05 NOTE — Telephone Encounter (Signed)
losartan-hydrochlorothiazide (HYZAAR) 100-25 MG tablet ,   [facility] [phone] - [place], [place] - [address] AT [facility] [address] [phone] [address] [place] [phone]   Patient contacted the office requesting a refill on medication losartan at pharmacy listed above. Patient is out of town and does not have enough medication to last. Pharmacist at this location told patient she would need authorization from her pcp to fill this medication. Please advise, thank you.

## 2022-11-09 ENCOUNTER — Telehealth: Payer: Self-pay | Admitting: *Deleted

## 2022-11-09 NOTE — Telephone Encounter (Signed)
Spoke with [name] and Patient agreed to appointment with Warner Mccreedy, NP on Tuesday, June 11th at 2:30 pm. Advised patient to prepare for a biopsy of the tissue at the appointment. Patient was receptive and Thanked the office and no further concerns or questions at this time.

## 2022-11-09 NOTE — Telephone Encounter (Signed)
Ms. Plymire called the office with complaints of seeing three white circles on her vulva with a white coating that is thin in consistency throughout her vagina and vulva. Patient denies pain, itching, and bleeding.   Patient states the discharge has a "fishy odor"  She denies any burning on urination, pain or other dysuria symptoms. She states the area of concern with the white spots (circles) only feels like a pin prick. Patient states, " I think it's Pagets"  Patient states she has been on two rounds of antibiotics (Amoxicillin-Clavulanate)  first 10 days in April then 10 days in May for a sinus infection.   Patient denies fever & chills, N/V & diarrhea. Patient has an appointment with Dr. Pricilla Holm on July 11th and is requesting an earlier appointment. Patient advised that message would be forwarded to Dr. Pricilla Holm and Warner Mccreedy, NP and the office would call her back.

## 2022-11-15 ENCOUNTER — Encounter: Payer: Self-pay | Admitting: Gynecologic Oncology

## 2022-11-16 ENCOUNTER — Other Ambulatory Visit: Payer: Self-pay

## 2022-11-16 ENCOUNTER — Inpatient Hospital Stay: Payer: Medicare Other | Attending: Gynecologic Oncology | Admitting: Gynecologic Oncology

## 2022-11-16 VITALS — BP 144/78 | HR 75 | Temp 98.5°F | Resp 18 | Ht 63.5 in | Wt 193.1 lb

## 2022-11-16 DIAGNOSIS — N9089 Other specified noninflammatory disorders of vulva and perineum: Secondary | ICD-10-CM | POA: Diagnosis not present

## 2022-11-16 DIAGNOSIS — M9903 Segmental and somatic dysfunction of lumbar region: Secondary | ICD-10-CM | POA: Diagnosis not present

## 2022-11-16 DIAGNOSIS — M9901 Segmental and somatic dysfunction of cervical region: Secondary | ICD-10-CM | POA: Diagnosis not present

## 2022-11-16 DIAGNOSIS — Z9079 Acquired absence of other genital organ(s): Secondary | ICD-10-CM | POA: Insufficient documentation

## 2022-11-16 DIAGNOSIS — Z8544 Personal history of malignant neoplasm of other female genital organs: Secondary | ICD-10-CM | POA: Insufficient documentation

## 2022-11-16 DIAGNOSIS — C519 Malignant neoplasm of vulva, unspecified: Secondary | ICD-10-CM

## 2022-11-16 DIAGNOSIS — M531 Cervicobrachial syndrome: Secondary | ICD-10-CM | POA: Diagnosis not present

## 2022-11-16 DIAGNOSIS — M5442 Lumbago with sciatica, left side: Secondary | ICD-10-CM | POA: Diagnosis not present

## 2022-11-16 NOTE — Progress Notes (Unsigned)
Gynecologic Oncology Return Clinic Visit  11/16/22  Reason for Visit: new vulvar lesion in the setting of history of Paget's disease  Treatment History: The patient previously had a supracervical hysterectomy but has her ovaries and cervix in situ. Surgery for Paget's disease of the vulva in October 2011.  There was focal involvement of the medial margin.  The entire lesion is approximately 5 cm in diameter.  She then underwent wide local excision of a recurrent Paget's lesion in August 2016.  She had surgical margins that were focally positive and subsequently underwent another excision on 04/10/2015 with negative margins. She had a vulvar biopsy from the right in November 2017 that was negative.   She was seen for a visible lesion and vulvar symptoms in April 2020 and a biopsy of this lesion was consistent with Paget's disease. On 11/07/2018, she underwent partial left vulvectomy with findings of a 1 cm white hyperkeratotic lesion on the left labia minora close to the clitoris.  Margins were positive.  Plan was for consideration of Aldara since repeat excision could involve removal of the clitoris. Patient was seen on 01/02/2019 with a slightly erythematous area on the right vulva.  Plan was for repeat examination in September with biopsy if persistence of the lesion. On 9/20, patient underwent right vulvar biopsy which revealed extramammary Paget's disease.  After discussion of treatment options, the patient opted for Aldara use. She was seen in early December, at which time her right vulvar lesion had completely resolved.  At this visit, a small right periclitoral lesion was noted. Given her excellent response to the previous lesion, her decision was to proceed with Aldara use for this new lesion. Saw me on 07/23/19 with a right peri-clitoral lesion suspected to be side effect from Aldara cream use. Seen on 02/29/20 with no evidence of disease on exam after 3 months not using Aldara.  Seen in 08/2020,  asymptomatic, off treatment. Seen last on 02/13/2021: asymptomatic with no concerning exam findings  Interval History: Patient presents to the office for evaluation of new vulvar lesion.  She states in May she noticed an area on the left vulva that initially looked like a squiggly line. The area eventually turned into 3 circular lesions which now have connected into a larger area. Reporting the sensation of needles/twinges in this area. No itching or abnormal drainage or bleeding. She has otherwise been doing since she was seen last.   Mammogram in 02/2022. Bone density in 02/2022.  Past Medical/Surgical History: 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  02-17-2011   PULLEY RELEASE RIGHT THUMB  03-23-2011   SIMPLE VULVECTOMY  03-24-2010   right side   TONSILLECTOMY  1977   TRIGGER FINGER RELEASE     VAGINAL HYSTERECTOMY  1989  and Anterior and posterior repair's for prolapse partial   VULVECTOMY Right 01/10/2015   Procedure: RIGHT WIDE LOCAL EXCISION VULVECTOMY;  Surgeon: De Blanch, MD;  Location: [facility];  Service: Gynecology;  Laterality: Right;   VULVECTOMY Right 04/10/2015   Procedure: WIDE LOCAL EXCISION VULVA;  Surgeon: De Blanch, MD;  Location: [facility];  Service: Gynecology;  Laterality: Right;   VULVECTOMY PARTIAL N/A 11/07/2018   Procedure: VULVECTOMY  PARTIAL;  Surgeon: Cleda Mccreedy, MD;  Location: [facility];  Service: Gynecology;  Laterality: N/A;   WISDOM TOOTH EXTRACTION      Family History  Problem Relation Age of Onset   Cancer Maternal Grandmother    Hypertension Father    Heart disease Father    Asthma Father    Hypertension Mother    Heart disease Mother    Breast cancer Paternal Grandmother 93   Colon cancer Paternal Grandmother    Colon cancer Paternal Uncle    Allergic rhinitis Neg Hx    Angioedema Neg Hx    Eczema Neg Hx    Immunodeficiency Neg Hx    Urticaria Neg Hx    Esophageal cancer Neg Hx    Rectal cancer Neg Hx    Stomach cancer Neg Hx     Social History   Socioeconomic History   Marital status: Married    Spouse name: Not on file   Number of children: 3   Years of education: Not on file   Highest education level: Not on file  Occupational History   Occupation: retired  Tobacco Use   Smoking status: Former    Years: 5    Types: Cigarettes    Quit date: 04/15/1989    Years since quitting: 33.6   Smokeless tobacco: Never  Vaping Use   Vaping Use: Never used  Substance and Sexual Activity   Alcohol use: Not Currently    Alcohol/week: 1.0 standard drink of alcohol    Types: 1 Glasses of wine per week    Comment: occasional wine   Drug use: No   Sexual activity: Not on file  Other Topics Concern   Not on file  Social History Narrative   She is married.  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   Occasional wine, former smoker not now no substances   3 kids plus two step daughters   Social Determinants of Health   Financial Resource Strain: Low Risk  (06/19/2021)   Overall Financial Resource Strain (CARDIA)    Difficulty of Paying Living Expenses: Not hard at all  Food Insecurity: No Food Insecurity (09/23/2022)   Hunger Vital Sign    Worried About Running Out of Food in the Last Year: Never true    Ran Out of Food in the Last Year: Never true  Transportation Needs: No  Transportation Needs (09/23/2022)   PRAPARE - Administrator, Civil Service (Medical): No    Lack of Transportation (Non-Medical): No  Physical Activity: Insufficiently Active (09/23/2022)   Exercise Vital Sign    Days of Exercise per Week: 3 days    Minutes of Exercise per Session: 20 min  Stress: No Stress Concern Present (09/23/2022)   [employer] of Occupational Health - Occupational Stress Questionnaire    Feeling of Stress : Not at all  Social Connections: Socially Integrated (09/23/2022)   Social Connection and Isolation Panel [NHANES]    Frequency of Communication with Friends and Family: More than three times a week  Frequency of Social Gatherings with Friends and Family: More than three times a week    Attends Religious Services: More than 4 times per year    Active Member of Clubs or Organizations: Yes    Attends Engineer, structural: More than 4 times per year    Marital Status: Married    Current Medications:  Current Outpatient Medications:    albuterol (VENTOLIN HFA) 108 (90 Base) MCG/ACT inhaler, INHALE 2 PUFFS INTO THE LUNGS EVERY 6 HOURS AS NEEDED FOR WHEEZING OR SHORTNESS OF BREATH, Disp: 18 g, Rfl: 1   alendronate (FOSAMAX) 70 MG tablet, Take 1 tablet (70 mg total) by mouth every 7 (seven) days. Take with a full glass of water on an empty stomach., Disp: 4 tablet, Rfl: 11   ALLERGY RELIEF 180 MG tablet, TAKE 1 TABLET BY MOUTH EVERY DAY AS NEEDED FOR ALLERGIES(CAN TAKE EXTRA DOSE DURING FLARE UPS), Disp: 180 tablet, Rfl: 0   atorvastatin (LIPITOR) 10 MG tablet, TAKE 1 TABLET(10 MG) BY MOUTH DAILY, Disp: 90 tablet, Rfl: 3   cyclobenzaprine (FLEXERIL) 5 MG tablet, , Disp: , Rfl:    dicyclomine (BENTYL) 10 MG capsule, Take 1 capsule (10 mg total) by mouth 3 (three) times daily between meals as needed for spasms., Disp: 270 capsule, Rfl: 3   famotidine (PEPCID) 20 MG tablet, Take 1 tablet (20 mg total) by mouth 2 (two) times daily., Disp: 180  tablet, Rfl: 1   fluticasone (FLOVENT HFA) 110 MCG/ACT inhaler, SMARTSIG:2 Puff(s) By Mouth Twice Daily, Disp: , Rfl:    ipratropium (ATROVENT) 0.03 % nasal spray, INSTILL 1-2 SPRAYS IN EACH NOSTRIL TWICE DAILY AS NEEDED, Disp: 30 mL, Rfl: 5   lansoprazole (PREVACID) 15 MG capsule, Take 1 capsule (15 mg total) by mouth 2 (two) times daily before a meal., Disp: 180 capsule, Rfl: 1   losartan-hydrochlorothiazide (HYZAAR) 100-25 MG tablet, Take 1 tablet by mouth daily., Disp: 7 tablet, Rfl: 0   montelukast (SINGULAIR) 10 MG tablet, Take 1 tablet (10 mg total) by mouth at bedtime., Disp: 90 tablet, Rfl: 3   triamcinolone cream (KENALOG) 0.1 %, Apply topically 2 (two) times daily., Disp: , Rfl:    imiquimod (ALDARA) 5 % cream, Apply topically 3 (three) times a week. Apply to right and left vulva 3 times a week, use for 12 weeks, Disp: 12 each, Rfl: 1  Review of Systems: See interval. Additional ROS positive for cough related to allergies. Denies appetite changes, fevers, chills, fatigue, unexplained weight changes. Denies hearing loss, neck lumps or masses, mouth sores, ringing in ears or voice changes. Denies wheezing.  Denies shortness of breath. Denies chest pain or palpitations. Denies leg swelling. Denies abdominal distention, pain, blood in stools, constipation, diarrhea, nausea, vomiting, or early satiety. Denies pain with intercourse, dysuria, frequency, hematuria or incontinence. Denies hot flashes, pelvic pain, vaginal bleeding or vaginal discharge.   Denies joint pain, back pain or muscle pain/cramps. Denies itching, rash, or wounds. Denies dizziness, headaches, numbness or seizures. Denies swollen lymph nodes or glands, denies easy bruising or bleeding. Denies anxiety, depression, confusion, or decreased concentration.  Physical Exam: BP (!) 144/78 (BP Location: Left Arm, Patient Position: Sitting)   Pulse 75   Temp 98.5 F (36.9 C) (Oral)   Resp 18   Ht 5' 3.5" (1.613 m)   Wt  193 lb 0.8 oz (87.6 kg)   SpO2 98%   BMI 33.66 kg/m  General: Alert, oriented, no acute distress. HEENT: Normocephalic, atraumatic, sclera anicteric. Chest: Lungs  clear. Unlabored breathing on room air. Heart regular in rate and rhythm. Abdomen: Soft, nontender.  Normoactive bowel sounds.  No masses or hepatosplenomegaly appreciated.   Extremities: Grossly normal range of motion.  Warm, well perfused.  No edema bilaterally. Lymphatics: No cervical, supraclavicular, or inguinal adenopathy. GU: External genitalia is notable for surgically absent right labia minora and portion of the majora. On the left labia minora, there is a 1 cm raised whitish lesion with no surrounding erythema. On the right vulva at the introitus, there is a 1.5 cm area with mild erythema and white changes.     Vulvar Biopsy: After obtaining the patient's verbal and written consent, the area on the left vulva was prepped with Betadine and anesthetized with 0.5 cc of 2% xylocaine. Using a 3 mm punch biopsy, a biopsy was taken and sent for pathology.  Hemostasis was obtained using silver nitrate.  She tolerated the procedure well.    Laboratory & Radiologic Studies: None new  Assessment & Plan: CHRISTE BOTT is a 72 y.o. woman with a history of recurrent Paget's disease of the vulva with new lesions concerning for pagets on the left upper vulva and right vulva at the introitus. She will be contacted with the results of the biopsy from today with recommendations moving forward. We discussed the possible use of aldara cream in these areas if pagets confirmed. Patient has used this in the past and has tolerated it well. Peri care discussed. Reportable signs and symptoms reviewed. She is advised to call for any needs, questions, or concerns.    25 minutes of total time was spent for this patient encounter, including preparation, face-to-face counseling with the patient and coordination of care, and documentation of the  encounter.  Warner Mccreedy NP [facility] GYN Oncology

## 2022-11-16 NOTE — Patient Instructions (Addendum)
Today we took a biopsy from the vulva. You may have some light spotting or grayish discharge from the medication use to stop bleeding. Use the peri bottle when using the bathroom and after pat the vulva dry.   We will contact you with the results of your biopsy from today along with the recommendations moving forward.

## 2022-11-18 ENCOUNTER — Telehealth: Payer: Self-pay | Admitting: Gynecologic Oncology

## 2022-11-18 DIAGNOSIS — C519 Malignant neoplasm of vulva, unspecified: Secondary | ICD-10-CM

## 2022-11-18 LAB — SURGICAL PATHOLOGY

## 2022-11-18 MED ORDER — IMIQUIMOD 5 % EX CREA
TOPICAL_CREAM | CUTANEOUS | 1 refills | Status: DC
Start: 2022-11-19 — End: 2023-11-24

## 2022-11-18 NOTE — Telephone Encounter (Signed)
Called patient and discussed recent vulvar biopsy results.  The biopsy returned with her known history of Paget's disease of the vulva.  Discussed the plan per Dr. Pricilla Holm of using topical Aldara cream on the 2 areas on the vulva 3 times per week for the next 3 months.  Follow-up will be arranged in the office at the 36-month mark to reassess.  We discussed cream application including using Vaseline on the left vulvar skin at the introitus to protect this area.  Advised patient detailed information will be sent to her via MyChart including a picture of where to apply the cream.  She is advised to call for any needs or concerns.  She is advised to call for any symptoms from cream use.  She is agreeable with this plan.

## 2022-11-19 ENCOUNTER — Encounter: Payer: Self-pay | Admitting: Gynecologic Oncology

## 2022-11-23 ENCOUNTER — Telehealth: Payer: Self-pay | Admitting: *Deleted

## 2022-11-23 NOTE — Telephone Encounter (Signed)
Patient scheduled for a follow appt on 10/4 with Dr Pricilla Holm

## 2022-11-30 DIAGNOSIS — M531 Cervicobrachial syndrome: Secondary | ICD-10-CM | POA: Diagnosis not present

## 2022-11-30 DIAGNOSIS — M9903 Segmental and somatic dysfunction of lumbar region: Secondary | ICD-10-CM | POA: Diagnosis not present

## 2022-11-30 DIAGNOSIS — M5442 Lumbago with sciatica, left side: Secondary | ICD-10-CM | POA: Diagnosis not present

## 2022-11-30 DIAGNOSIS — M9901 Segmental and somatic dysfunction of cervical region: Secondary | ICD-10-CM | POA: Diagnosis not present

## 2022-12-02 DIAGNOSIS — R200 Anesthesia of skin: Secondary | ICD-10-CM | POA: Diagnosis not present

## 2022-12-02 DIAGNOSIS — M720 Palmar fascial fibromatosis [Dupuytren]: Secondary | ICD-10-CM | POA: Diagnosis not present

## 2022-12-14 DIAGNOSIS — C4490 Unspecified malignant neoplasm of skin, unspecified: Secondary | ICD-10-CM | POA: Diagnosis not present

## 2022-12-15 ENCOUNTER — Other Ambulatory Visit: Payer: Self-pay | Admitting: Family Medicine

## 2022-12-16 ENCOUNTER — Other Ambulatory Visit: Payer: Self-pay

## 2022-12-16 ENCOUNTER — Ambulatory Visit: Payer: Medicare Other | Admitting: Gynecologic Oncology

## 2022-12-16 ENCOUNTER — Telehealth: Payer: Self-pay | Admitting: Surgery

## 2022-12-16 ENCOUNTER — Other Ambulatory Visit: Payer: Self-pay | Admitting: Allergy

## 2022-12-16 ENCOUNTER — Encounter: Payer: Self-pay | Admitting: Allergy

## 2022-12-16 ENCOUNTER — Ambulatory Visit: Payer: Medicare Other | Admitting: Allergy

## 2022-12-16 VITALS — BP 130/76 | HR 69 | Temp 98.7°F | Ht 63.5 in | Wt 194.9 lb

## 2022-12-16 DIAGNOSIS — K2100 Gastro-esophageal reflux disease with esophagitis, without bleeding: Secondary | ICD-10-CM | POA: Diagnosis not present

## 2022-12-16 DIAGNOSIS — J3089 Other allergic rhinitis: Secondary | ICD-10-CM

## 2022-12-16 DIAGNOSIS — J4540 Moderate persistent asthma, uncomplicated: Secondary | ICD-10-CM

## 2022-12-16 DIAGNOSIS — J302 Other seasonal allergic rhinitis: Secondary | ICD-10-CM | POA: Diagnosis not present

## 2022-12-16 MED ORDER — IPRATROPIUM BROMIDE 0.03 % NA SOLN: 2.0000 | Freq: Two times a day (BID) | NASAL | 1 refills | Status: DC

## 2022-12-16 MED ORDER — FAMOTIDINE 20 MG PO TABS: 20.0000 mg | ORAL_TABLET | Freq: Two times a day (BID) | ORAL | 1 refills | Status: DC

## 2022-12-16 MED ORDER — LANSOPRAZOLE 15 MG PO CPDR: 15.0000 mg | DELAYED_RELEASE_CAPSULE | Freq: Two times a day (BID) | ORAL | 1 refills | Status: DC

## 2022-12-16 MED ORDER — FLUTICASONE PROPIONATE HFA 110 MCG/ACT IN AERO: 2.0000 | INHALATION_SPRAY | Freq: Two times a day (BID) | RESPIRATORY_TRACT | 1 refills | Status: DC

## 2022-12-16 MED ORDER — MONTELUKAST SODIUM 10 MG PO TABS: 10.0000 mg | ORAL_TABLET | Freq: Every day | ORAL | 3 refills | Status: DC

## 2022-12-16 MED ORDER — FEXOFENADINE HCL 180 MG PO TABS: 180.0000 mg | ORAL_TABLET | Freq: Every day | ORAL | 1 refills | Status: DC | PRN

## 2022-12-16 MED ORDER — ALBUTEROL SULFATE HFA 108 (90 BASE) MCG/ACT IN AERS: 2.0000 | INHALATION_SPRAY | Freq: Four times a day (QID) | RESPIRATORY_TRACT | 1 refills | Status: DC | PRN

## 2022-12-16 NOTE — Progress Notes (Signed)
Follow-up Note  RE: [name] MRN: [id number] DOB: 1951-02-24 Date of Office Visit: 12/16/2022   History of present illness: [name] is a 72 y.o. female presenting today for follow-up of asthma, allergic rhinitis and reflux.  She was last seen in the office on 06/17/2022 by myself.  Since this visit she did have a sinus infection that required 2 antibiotic rounds to clear it up. Since the sinus infections she states she has been doing well.  With her allergy control she did increase the Allegra to 2 tablets a day and that has been very helpful.  She did discontinue use of the nasal Atrovent to see if it was causing her hoarseness to worsen.  She does feel like her voice has improved since she has not been using it.  However when she was using it it did help with nasal drainage control.  She continues to take her Singulair daily.  She has not needed to use the Flovent or albuterol and states her albuterol use has been "not much".  She has not required any systemic steroids since her last visit.  She does continue to take lansoprazole and famotidine for reflux control and these do not work well for her.  Review of systems in the past 4 weeks: Review of Systems  Constitutional: Negative.   HENT: Negative.    Eyes: Negative.   Respiratory: Negative.    Cardiovascular: Negative.   Gastrointestinal: Negative.   Musculoskeletal: Negative.   Skin: Negative.   Allergic/Immunologic: Negative.   Neurological: Negative.      All other systems negative unless noted above in HPI  Past medical/social/surgical/family history have been reviewed and are unchanged unless specifically indicated below.  No changes  Medication List: Current Outpatient Medications  Medication Sig Dispense Refill   albuterol (VENTOLIN HFA) 108 (90 Base) MCG/ACT inhaler INHALE 2 PUFFS INTO THE LUNGS EVERY 6 HOURS AS NEEDED FOR WHEEZING OR SHORTNESS OF BREATH 18 g 1   alendronate (FOSAMAX) 70 MG tablet Take 1  tablet (70 mg total) by mouth every 7 (seven) days. Take with a full glass of water on an empty stomach. 4 tablet 11   ALLERGY RELIEF 180 MG tablet TAKE 1 TABLET BY MOUTH EVERY DAY AS NEEDED FOR ALLERGIES(CAN TAKE EXTRA DOSE DURING FLARE UPS) 180 tablet 0   atorvastatin (LIPITOR) 10 MG tablet TAKE 1 TABLET(10 MG) BY MOUTH DAILY 90 tablet 3   cyclobenzaprine (FLEXERIL) 5 MG tablet      dicyclomine (BENTYL) 10 MG capsule Take 1 capsule (10 mg total) by mouth 3 (three) times daily between meals as needed for spasms. 270 capsule 3   famotidine (PEPCID) 20 MG tablet Take 1 tablet (20 mg total) by mouth 2 (two) times daily. 180 tablet 1   fluticasone (FLOVENT HFA) 110 MCG/ACT inhaler SMARTSIG:2 Puff(s) By Mouth Twice Daily     imiquimod (ALDARA) 5 % cream Apply topically 3 (three) times a week. Apply to right and left vulva 3 times a week, use for 12 weeks 12 each 1   ipratropium (ATROVENT) 0.03 % nasal spray INSTILL 1-2 SPRAYS IN EACH NOSTRIL TWICE DAILY AS NEEDED 30 mL 5   lansoprazole (PREVACID) 15 MG capsule Take 1 capsule (15 mg total) by mouth 2 (two) times daily before a meal. 180 capsule 1   losartan-hydrochlorothiazide (HYZAAR) 100-25 MG tablet Take 1 tablet by mouth daily. 7 tablet 0   montelukast (SINGULAIR) 10 MG tablet Take 1 tablet (10 mg total)  by mouth at bedtime. 90 tablet 3   triamcinolone cream (KENALOG) 0.1 % Apply topically 2 (two) times daily.     No current facility-administered medications for this visit.     Known medication allergies: Allergies  Allergen Reactions   Ibuprofen Hypertension   Naprosyn [Naproxen] Nausea Only   Other     Allergic to toothpaste- makes mouth peel Hay fever/dust mites/trees "365 allergic"    Sevoflurane Nausea And Vomiting   Azithromycin Itching and Rash   Sulfa Antibiotics Hives and Rash     Physical examination: Blood pressure 130/76, pulse 69, temperature 98.7 F (37.1 C), height 5' 3.5" (1.613 m), weight 194 lb 14.4 oz (88.4 kg),  SpO2 95%.  General: Alert, interactive, in no acute distress. HEENT: PERRLA, TMs pearly gray, turbinates non-edematous without discharge, post-pharynx non erythematous. Neck: Supple without lymphadenopathy. Lungs: Clear to auscultation without wheezing, rhonchi or rales. {no increased work of breathing. CV: Normal S1, S2 without murmurs. Abdomen: Nondistended, nontender. Skin: Warm and dry, without lesions or rashes. Extremities:  No clubbing, cyanosis or edema. Neuro:   Grossly intact.  Diagnositics/Labs:  Spirometry: FEV1: 2.1L 98%, FVC: 2.62L 95%, ratio consistent with nonobstructive pattern  Assessment and plan:   Asthma Continue montelukast 10 mg once a day to prevent cough or wheeze During respiratory illness or flares start Flovent 110 mcg to 2 puffs twice a day for 1-2 weeks with spacer and rinse mouth out after Continue albuterol 2 puffs once every 4 hours as needed for cough or wheeze You may use albuterol 2 puffs 5 to 15 minutes before activity to decrease cough or wheeze  Asthma control goals:  Full participation in all desired activities (may need albuterol before activity) Albuterol use two time or less a week on average (not counting use with activity) Cough interfering with sleep two time or less a month Oral steroids no more than once a year No hospitalizations   Allergic rhinitis Continue allergen avoidance measures directed toward pollens, cat, and mold  Continue Allegra 180 mg twice a day for allergy symptom control Use Ipratropium bromide nasal spray using 1-2 sprays in each nostril 3-4 times a day as needed for drainage down throat/runny nose or congestion. Caution as this can be drying Consider saline nasal rinses as needed for nasal symptoms. Use this before any medicated nasal sprays for best result  Reflux Continue dietary and lifestyle modifications as listed below Continue lansoprazole 15 mg twice a day and famotidine 20 mg twice a day  Follow up  in 6 months or sooner if needed.  I appreciate the opportunity to take part in [name]'s care. Please do not hesitate to contact me with questions.  Sincerely,   Margo Aye, MD Allergy/Immunology [facility]

## 2022-12-16 NOTE — Addendum Note (Signed)
Addended by: Robet Leu A on: 12/16/2022 08:12 PM   Modules accepted: Orders

## 2022-12-16 NOTE — Patient Instructions (Signed)
Asthma Continue montelukast 10 mg once a day to prevent cough or wheeze During respiratory illness or flares start Flovent 110 mcg to 2 puffs twice a day for 1-2 weeks with spacer and rinse mouth out after Continue albuterol 2 puffs once every 4 hours as needed for cough or wheeze You may use albuterol 2 puffs 5 to 15 minutes before activity to decrease cough or wheeze  Asthma control goals:  Full participation in all desired activities (may need albuterol before activity) Albuterol use two time or less a week on average (not counting use with activity) Cough interfering with sleep two time or less a month Oral steroids no more than once a year No hospitalizations   Allergic rhinitis Continue allergen avoidance measures directed toward pollens, cat, and mold  Continue Allegra 180 mg twice a day for allergy symptom control Use Ipratropium bromide nasal spray using 1-2 sprays in each nostril 3-4 times a day as needed for drainage down throat/runny nose or congestion. Caution as this can be drying Consider saline nasal rinses as needed for nasal symptoms. Use this before any medicated nasal sprays for best result  Reflux Continue dietary and lifestyle modifications as listed below Continue lansoprazole 15 mg twice a day and famotidine 20 mg twice a day  Follow up in 6 months or sooner if needed.

## 2022-12-16 NOTE — Telephone Encounter (Signed)
Returned patient call requesting clarification on how to use her Aldara cream. Advised patient that she can apply a small amount of the cream to a qtip and apply to the 2 affected areas. It is normal that she will have extra medication left in the packet, she may save this for the next use. She should also be applying a thin layer of vasoline to the surrounding skin as a barrier. Patient verbalized understanding and had no other questions at this time.

## 2022-12-17 NOTE — Addendum Note (Signed)
Addended by: Dollene Cleveland R on: 12/17/2022 04:33 PM   Modules accepted: Orders

## 2022-12-22 DIAGNOSIS — L304 Erythema intertrigo: Secondary | ICD-10-CM | POA: Diagnosis not present

## 2022-12-22 DIAGNOSIS — D225 Melanocytic nevi of trunk: Secondary | ICD-10-CM | POA: Diagnosis not present

## 2022-12-22 DIAGNOSIS — L720 Epidermal cyst: Secondary | ICD-10-CM | POA: Diagnosis not present

## 2022-12-22 DIAGNOSIS — L814 Other melanin hyperpigmentation: Secondary | ICD-10-CM | POA: Diagnosis not present

## 2022-12-23 ENCOUNTER — Other Ambulatory Visit: Payer: Self-pay | Admitting: Family Medicine

## 2022-12-30 DIAGNOSIS — R936 Abnormal findings on diagnostic imaging of limbs: Secondary | ICD-10-CM | POA: Diagnosis not present

## 2022-12-30 DIAGNOSIS — R94130 Abnormal response to nerve stimulation, unspecified: Secondary | ICD-10-CM | POA: Diagnosis not present

## 2022-12-30 DIAGNOSIS — G5601 Carpal tunnel syndrome, right upper limb: Secondary | ICD-10-CM | POA: Diagnosis not present

## 2023-01-04 DIAGNOSIS — H524 Presbyopia: Secondary | ICD-10-CM | POA: Diagnosis not present

## 2023-01-17 ENCOUNTER — Other Ambulatory Visit: Payer: Self-pay | Admitting: Family Medicine

## 2023-02-08 ENCOUNTER — Other Ambulatory Visit: Payer: Self-pay | Admitting: Family Medicine

## 2023-02-08 DIAGNOSIS — Z1231 Encounter for screening mammogram for malignant neoplasm of breast: Secondary | ICD-10-CM

## 2023-02-14 DIAGNOSIS — G5601 Carpal tunnel syndrome, right upper limb: Secondary | ICD-10-CM | POA: Diagnosis not present

## 2023-02-24 DIAGNOSIS — G5601 Carpal tunnel syndrome, right upper limb: Secondary | ICD-10-CM | POA: Diagnosis not present

## 2023-03-01 ENCOUNTER — Ambulatory Visit
Admission: RE | Admit: 2023-03-01 | Discharge: 2023-03-01 | Disposition: A | Discharge status: Home / Self Care | Payer: Medicare Other | Source: Ambulatory Visit | Attending: Family Medicine | Admitting: Family Medicine

## 2023-03-01 DIAGNOSIS — Z1231 Encounter for screening mammogram for malignant neoplasm of breast: Secondary | ICD-10-CM | POA: Diagnosis not present

## 2023-03-11 ENCOUNTER — Inpatient Hospital Stay: Payer: Medicare Other | Admitting: Gynecologic Oncology

## 2023-03-11 DIAGNOSIS — C519 Malignant neoplasm of vulva, unspecified: Secondary | ICD-10-CM

## 2023-03-14 ENCOUNTER — Other Ambulatory Visit: Payer: Self-pay | Admitting: Family Medicine

## 2023-03-25 ENCOUNTER — Encounter: Payer: Self-pay | Admitting: Gynecologic Oncology

## 2023-03-25 ENCOUNTER — Inpatient Hospital Stay: Payer: Medicare Other | Attending: Gynecologic Oncology | Admitting: Gynecologic Oncology

## 2023-03-25 VITALS — BP 137/79 | HR 98 | Temp 98.2°F | Resp 19 | Wt 194.0 lb

## 2023-03-25 DIAGNOSIS — B379 Candidiasis, unspecified: Secondary | ICD-10-CM

## 2023-03-25 DIAGNOSIS — B3731 Acute candidiasis of vulva and vagina: Secondary | ICD-10-CM | POA: Diagnosis not present

## 2023-03-25 DIAGNOSIS — Z79899 Other long term (current) drug therapy: Secondary | ICD-10-CM | POA: Diagnosis not present

## 2023-03-25 DIAGNOSIS — Z90711 Acquired absence of uterus with remaining cervical stump: Secondary | ICD-10-CM | POA: Diagnosis not present

## 2023-03-25 DIAGNOSIS — N9089 Other specified noninflammatory disorders of vulva and perineum: Secondary | ICD-10-CM

## 2023-03-25 DIAGNOSIS — C519 Malignant neoplasm of vulva, unspecified: Secondary | ICD-10-CM | POA: Diagnosis not present

## 2023-03-25 DIAGNOSIS — Z87891 Personal history of nicotine dependence: Secondary | ICD-10-CM | POA: Diagnosis not present

## 2023-03-25 MED ORDER — FLUCONAZOLE 150 MG PO TABS
150.0000 mg | ORAL_TABLET | Freq: Every day | ORAL | 0 refills | Status: DC
Start: 2023-03-25 — End: 2023-08-16

## 2023-03-25 NOTE — Patient Instructions (Signed)
It was good to see you today.  We will see you in a couple of weeks.  I am sending in a dose of a medication that you will take once to treat any yeast.  Please use warm compresses and Epson bath salts.  I would use Vaseline on the area of the vulva that feels irritated.  You can try oral Benadryl to help with any itching symptoms.

## 2023-03-25 NOTE — Progress Notes (Signed)
Gynecologic Oncology Return Clinic Visit  03/25/23  Reason for Visit: follow-up  Treatment History: The patient previously had a supracervical hysterectomy but has her ovaries and cervix in situ. Surgery for Paget's disease of the vulva in October 2011.  There was focal involvement of the medial margin.  The entire lesion is approximately 5 cm in diameter.  She then underwent wide local excision of a recurrent Paget's lesion in August 2016.  She had surgical margins that were focally positive and subsequently underwent another excision on 04/10/2015 with negative margins. She had a vulvar biopsy from the right in November 2017 that was negative.   She was seen for a visible lesion and vulvar symptoms in April 2020 and a biopsy of this lesion was consistent with Paget's disease. On 11/07/2018, she underwent partial left vulvectomy with findings of a 1 cm white hyperkeratotic lesion on the left labia minora close to the clitoris.  Margins were positive.  Plan was for consideration of Aldara since repeat excision could involve removal of the clitoris. Patient was seen on 01/02/2019 with a slightly erythematous area on the right vulva.  Plan was for repeat examination in September with biopsy if persistence of the lesion. On 9/20, patient underwent right vulvar biopsy which revealed extramammary Paget's disease.  After discussion of treatment options, the patient opted for Aldara use. She was seen in early December, at which time her right vulvar lesion had completely resolved.  At this visit, a small right periclitoral lesion was noted. Given her excellent response to the previous lesion, her decision was to proceed with Aldara use for this new lesion. Saw me on 07/23/19 with a right peri-clitoral lesion suspected to be side effect from Aldara cream use. Seen on 02/29/20 with no evidence of disease on exam after 3 months not using Aldara.  Seen in 08/2020, asymptomatic, off treatment. Seen on 02/13/2021:  asymptomatic with no concerning exam findings. Seen in 11/2022: new vulvar lesions, biopsy showed Paget's disease. Started treatment with Aldara in two locations.  Interval History: Overall doing well.  Denies any issue with Aldara treatment.  Has been off of it about 1 month.  2 areas of white tissue on the vulva have resolved.  She notes about a week of some irritation on the left side that has some associated pruritus.  She notes some irritation when urine touches the side.  Had a pustule or similar lesion on her vulva bled and had some discharge on 2 separate occasions over the last week.  Past Medical/Surgical History: 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  02-17-2011   PULLEY RELEASE RIGHT THUMB  03-23-2011   SIMPLE VULVECTOMY  03-24-2010   right side   TONSILLECTOMY  1977   TRIGGER FINGER RELEASE  VAGINAL HYSTERECTOMY  1989   and Anterior and posterior repair's for prolapse partial   VULVECTOMY Right 01/10/2015   Procedure: RIGHT WIDE LOCAL EXCISION VULVECTOMY;  Surgeon: De Blanch, MD;  Location: [facility];  Service: Gynecology;  Laterality: Right;   VULVECTOMY Right 04/10/2015   Procedure: WIDE LOCAL EXCISION VULVA;  Surgeon: De Blanch, MD;  Location: [facility];  Service: Gynecology;  Laterality: Right;   VULVECTOMY PARTIAL N/A 11/07/2018   Procedure:  VULVECTOMY PARTIAL;  Surgeon: Cleda Mccreedy, MD;  Location: [facility];  Service: Gynecology;  Laterality: N/A;   WISDOM TOOTH EXTRACTION      Family History  Problem Relation Age of Onset   Cancer Maternal Grandmother    Hypertension Father    Heart disease Father    Asthma Father    Hypertension Mother    Heart disease Mother    Breast cancer Paternal Grandmother 13   Colon cancer Paternal Grandmother    Colon cancer Paternal Uncle    Allergic rhinitis Neg Hx    Angioedema Neg Hx    Eczema Neg Hx    Immunodeficiency Neg Hx    Urticaria Neg Hx    Esophageal cancer Neg Hx    Rectal cancer Neg Hx    Stomach cancer Neg Hx     Social History   Socioeconomic History   Marital status: Married    Spouse name: Not on file   Number of children: 3   Years of education: Not on file   Highest education level: Not on file  Occupational History   Occupation: retired  Tobacco Use   Smoking status: Former    Current packs/day: 0.00    Types: Cigarettes    Start date: 04/15/1984    Quit date: 04/15/1989    Years since quitting: 33.9   Smokeless tobacco: Never  Vaping Use   Vaping status: Never Used  Substance and Sexual Activity   Alcohol use: Not Currently    Alcohol/week: 1.0 standard drink of alcohol    Types: 1 Glasses of wine per week    Comment: occasional wine   Drug use: No   Sexual activity: Not on file  Other Topics Concern   Not on file  Social History Narrative   She is married.  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   Occasional wine, former smoker not now no substances   3 kids plus two step daughters   Social Determinants of Health   Financial Resource Strain: Low Risk  (06/19/2021)   Overall Financial Resource Strain (CARDIA)    Difficulty of Paying Living Expenses: Not hard at all  Food Insecurity: No Food Insecurity (09/23/2022)   Hunger Vital Sign    Worried About Running Out of Food in the Last Year: Never true    Ran Out of Food in  the Last Year: Never true  Transportation Needs: No Transportation Needs (09/23/2022)   PRAPARE - Administrator, Civil Service (Medical): No    Lack of Transportation (Non-Medical): No  Physical Activity: Insufficiently Active (09/23/2022)   Exercise Vital Sign    Days of Exercise per Week: 3 days    Minutes of Exercise per Session: 20 min  Stress: No Stress Concern Present (09/23/2022)   [employer] of Occupational Health - Occupational Stress Questionnaire    Feeling of Stress : Not at all  Social Connections: Socially Integrated (09/23/2022)   Social Connection and Isolation Panel [NHANES]    Frequency  of Communication with Friends and Family: More than three times a week    Frequency of Social Gatherings with Friends and Family: More than three times a week    Attends Religious Services: More than 4 times per year    Active Member of [employer] or Organizations: Yes    Attends Engineer, structural: More than 4 times per year    Marital Status: Married    Current Medications:  Current Outpatient Medications:    albuterol (VENTOLIN HFA) 108 (90 Base) MCG/ACT inhaler, Inhale 2 puffs into the lungs every 6 (six) hours as needed for wheezing or shortness of breath., Disp: 18 g, Rfl: 1   alendronate (FOSAMAX) 70 MG tablet, TAKE 1 TABLET(70 MG) BY MOUTH EVERY 7 DAYS WITH A FULL GLASS OF WATER AND ON AN EMPTY STOMACH, Disp: 4 tablet, Rfl: 11   atorvastatin (LIPITOR) 10 MG tablet, TAKE 1 TABLET(10 MG) BY MOUTH DAILY, Disp: 90 tablet, Rfl: 3   cyclobenzaprine (FLEXERIL) 5 MG tablet, , Disp: , Rfl:    dicyclomine (BENTYL) 10 MG capsule, Take 1 capsule (10 mg total) by mouth 3 (three) times daily between meals as needed for spasms., Disp: 270 capsule, Rfl: 3   famotidine (PEPCID) 20 MG tablet, Take 1 tablet (20 mg total) by mouth 2 (two) times daily., Disp: 180 tablet, Rfl: 1   fexofenadine (ALLERGY RELIEF) 180 MG tablet, Take 1 tablet (180 mg total) by mouth daily as needed  for allergies or rhinitis (Can take an extra dose during flare ups.)., Disp: 180 tablet, Rfl: 1   fluconazole (DIFLUCAN) 150 MG tablet, Take 1 tablet (150 mg total) by mouth daily., Disp: 1 tablet, Rfl: 0   imiquimod (ALDARA) 5 % cream, Apply topically 3 (three) times a week. Apply to right and left vulva 3 times a week, use for 12 weeks, Disp: 12 each, Rfl: 1   lansoprazole (PREVACID) 15 MG capsule, Take 1 capsule (15 mg total) by mouth 2 (two) times daily before a meal., Disp: 180 capsule, Rfl: 1   losartan-hydrochlorothiazide (HYZAAR) 100-25 MG tablet, TAKE 1 TABLET BY MOUTH EVERY DAY, Disp: 90 tablet, Rfl: 1   montelukast (SINGULAIR) 10 MG tablet, Take 1 tablet (10 mg total) by mouth at bedtime., Disp: 90 tablet, Rfl: 3   triamcinolone cream (KENALOG) 0.1 %, Apply topically 2 (two) times daily., Disp: , Rfl:   Review of Systems: Denies appetite changes, fevers, chills, fatigue, unexplained weight changes. Denies hearing loss, neck lumps or masses, mouth sores, ringing in ears or voice changes. Denies cough or wheezing.  Denies shortness of breath. Denies chest pain or palpitations. Denies leg swelling. Denies abdominal distention, pain, blood in stools, constipation, diarrhea, nausea, vomiting, or early satiety. Denies pain with intercourse, dysuria, frequency, hematuria or incontinence. Denies hot flashes, pelvic pain, vaginal bleeding or vaginal discharge.   Denies joint pain, back pain or muscle pain/cramps. Denies itching, rash, or wounds. Denies dizziness, headaches, numbness or seizures. Denies swollen lymph nodes or glands, denies easy bruising or bleeding. Denies anxiety, depression, confusion, or decreased concentration.  Physical Exam: BP 137/79 (BP Location: Right Arm, Patient Position: Sitting)   Pulse 98   Temp 98.2 F (36.8 C) (Oral)   Resp 19   Wt 194 lb (88 kg)   SpO2 98%   BMI 33.83 kg/m  General: Alert, oriented, no acute distress.  GU: Exam performed.   Previously noted areas on the right lower vulva and left mid vulva have completely resolved.  There is a  small, healing area on the right upper vulva, superior and lateral to the clitoris that appears may have been a small boil or sebaceous cyst.  No tenderness with palpation over this area.  Along the left inferior aspect of the clitoral hood, there is a small area where it looks like the top layer of skin was removed.  There is no surrounding erythema or evidence of irritation around this.  The left vulva itself is nonerythematous are there any visible lesions.  Laboratory & Radiologic Studies: 11/16/22: left vulvar biopsy - Vulvar tissue with involvement by patient's known extramammary Paget's  disease  - Margins not involved   Assessment & Plan: ARELIA PAS is a 72 y.o. woman with a history of recurrent Paget's disease of the vulva.   Discussed exam findings with the patient.  I suggested time dose of Diflucan to treat yeast infection.  There is an area excoriation where it looks like the top layer of skin may have been scratched off.  I discussed that we could either biopsy this today or see if area improves with a couple of weeks of conservative management including using Vaseline on the left vulva and doing Epsom salt soaks and warm compresses.  Patient elected to proceed with the latter option.  We will see her back in approximately 2 weeks.  If area still visible along the left aspect of the clitoral hood, we will perform a biopsy at that time.  20 minutes of total time was spent for this patient encounter, including preparation, face-to-face counseling with the patient and coordination of care, and documentation of the encounter.  Eugene Garnet, MD  Division of Gynecologic Oncology  Department of Obstetrics and Gynecology  [facility]

## 2023-03-28 ENCOUNTER — Telehealth: Payer: Self-pay | Admitting: *Deleted

## 2023-03-28 ENCOUNTER — Ambulatory Visit: Payer: Medicare Other

## 2023-03-28 NOTE — Telephone Encounter (Signed)
Spoke with [name] who called the office to say the area on her vulva  from her last visit on 10/18 with Dr. Pricilla Holm is white. Pt states she believes it is her Pagets's disease, although she has had some itching in her groin area that is now better having taking the Diflucan. Pt continues to use Vaseline and has not tried epsom salt soaks and warm compresses. Pt advised to try the Epsom salt soaks and warm compresses and her message would be relayed to providers. Pt reminded of her appt. That is scheduled on 10/30 with Warner Mccreedy, NP.

## 2023-03-29 ENCOUNTER — Ambulatory Visit (INDEPENDENT_AMBULATORY_CARE_PROVIDER_SITE_OTHER): Payer: Medicare Other

## 2023-03-29 DIAGNOSIS — Z23 Encounter for immunization: Secondary | ICD-10-CM

## 2023-04-01 ENCOUNTER — Other Ambulatory Visit: Payer: Self-pay

## 2023-04-01 ENCOUNTER — Inpatient Hospital Stay (HOSPITAL_BASED_OUTPATIENT_CLINIC_OR_DEPARTMENT_OTHER): Payer: Medicare Other | Admitting: Gynecologic Oncology

## 2023-04-01 VITALS — BP 130/80 | HR 70 | Temp 98.5°F | Resp 20 | Wt 192.6 lb

## 2023-04-01 DIAGNOSIS — Z90711 Acquired absence of uterus with remaining cervical stump: Secondary | ICD-10-CM | POA: Diagnosis not present

## 2023-04-01 DIAGNOSIS — N9089 Other specified noninflammatory disorders of vulva and perineum: Secondary | ICD-10-CM

## 2023-04-01 DIAGNOSIS — C519 Malignant neoplasm of vulva, unspecified: Secondary | ICD-10-CM

## 2023-04-01 DIAGNOSIS — Z79899 Other long term (current) drug therapy: Secondary | ICD-10-CM | POA: Diagnosis not present

## 2023-04-01 DIAGNOSIS — Z87891 Personal history of nicotine dependence: Secondary | ICD-10-CM | POA: Diagnosis not present

## 2023-04-01 DIAGNOSIS — N762 Acute vulvitis: Secondary | ICD-10-CM | POA: Diagnosis not present

## 2023-04-01 DIAGNOSIS — B3731 Acute candidiasis of vulva and vagina: Secondary | ICD-10-CM | POA: Diagnosis not present

## 2023-04-01 NOTE — Telephone Encounter (Signed)
Spoke with [name] and scheduled appt. Today with Warner Mccreedy, NP at 2 pm with arrival time of 1345 for check in. Pt agreed to date and time and thanked the office for calling.

## 2023-04-01 NOTE — Patient Instructions (Signed)
Today we took a biopsy from the left upper vulva. We will contact you when the results return. You may have a grayish discharge from the medication used to stop bleeding.   When you toilet over the weekend, rinse the area and pat dry.   Please call for any needs, concerns, or new symptoms and we will talk to you next week when the results return.

## 2023-04-01 NOTE — Progress Notes (Signed)
Gynecologic Oncology Return Clinic Visit  04/01/23  Reason for Visit: evaluation of white vulvar lesion  Treatment History: The patient previously had a supracervical hysterectomy but has her ovaries and cervix in situ. Surgery for Paget's disease of the vulva in October 2011.  There was focal involvement of the medial margin.  The entire lesion is approximately 5 cm in diameter.  She then underwent wide local excision of a recurrent Paget's lesion in August 2016.  She had surgical margins that were focally positive and subsequently underwent another excision on 04/10/2015 with negative margins. She had a vulvar biopsy from the right in November 2017 that was negative.   She was seen for a visible lesion and vulvar symptoms in April 2020 and a biopsy of this lesion was consistent with Paget's disease. On 11/07/2018, she underwent partial left vulvectomy with findings of a 1 cm white hyperkeratotic lesion on the left labia minora close to the clitoris.  Margins were positive.  Plan was for consideration of Aldara since repeat excision could involve removal of the clitoris. Patient was seen on 01/02/2019 with a slightly erythematous area on the right vulva.  Plan was for repeat examination in September with biopsy if persistence of the lesion. On 9/20, patient underwent right vulvar biopsy which revealed extramammary Paget's disease.  After discussion of treatment options, the patient opted for Aldara use. She was seen in early December, at which time her right vulvar lesion had completely resolved.  At this visit, a small right periclitoral lesion was noted. Given her excellent response to the previous lesion, her decision was to proceed with Aldara use for this new lesion. Saw Dr. Pricilla Holm on 07/23/19 with a right peri-clitoral lesion suspected to be side effect from Aldara cream use. Seen on 02/29/20 with no evidence of disease on exam after 3 months not using Aldara.  Seen in 08/2020, asymptomatic, off  treatment. Seen on 02/13/2021: asymptomatic with no concerning exam findings. Seen in 11/2022: new vulvar lesions, biopsy showed Paget's disease. Started treatment with Aldara in two locations. Seen on 03/25/23: improvement with aldara application. Skin abrasion noted on left vulva-pt advised to monitor  Interval History: Patient presents to the office today for evaluation of a white skin lesion on the left upper vulva which she is concerned may be recurrent Paget's.  She has not had any significant itching.  She noticed the area was white on the anterior portion. No bleeding or drainage from this area. She had some improvement in her vulvar symptoms after taking diflucan. She is otherwise doing well. No other concerns voiced.    Past Medical/Surgical History: 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  02-17-2011   PULLEY RELEASE RIGHT THUMB  03-23-2011   SIMPLE VULVECTOMY  03-24-2010   right side  TONSILLECTOMY  1977   TRIGGER FINGER RELEASE     VAGINAL HYSTERECTOMY  1989   and Anterior and posterior repair's for prolapse partial   VULVECTOMY Right 01/10/2015   Procedure: RIGHT WIDE LOCAL EXCISION VULVECTOMY;  Surgeon: De Blanch, MD;  Location: [facility];  Service: Gynecology;  Laterality: Right;   VULVECTOMY Right 04/10/2015   Procedure: WIDE LOCAL EXCISION VULVA;  Surgeon: De Blanch, MD;  Location: [facility];  Service: Gynecology;  Laterality: Right;   VULVECTOMY PARTIAL N/A 11/07/2018   Procedure: VULVECTOMY PARTIAL;  Surgeon: Cleda Mccreedy, MD;  Location: [facility];  Service: Gynecology;  Laterality: N/A;   WISDOM TOOTH EXTRACTION      Family History  Problem Relation Age of Onset   Cancer Maternal Grandmother    Hypertension Father    Heart disease Father    Asthma Father    Hypertension Mother    Heart disease Mother    Breast cancer Paternal Grandmother 66   Colon cancer Paternal Grandmother    Colon cancer Paternal Uncle    Allergic rhinitis Neg Hx    Angioedema Neg Hx    Eczema Neg Hx    Immunodeficiency Neg Hx    Urticaria Neg Hx    Esophageal cancer Neg Hx    Rectal cancer Neg Hx    Stomach cancer Neg Hx     Social History   Socioeconomic History   Marital status: Married    Spouse name: Not on file   Number of children: 3   Years of education: Not on file   Highest education level: Not on file  Occupational History   Occupation: retired  Tobacco Use   Smoking status: Former    Current packs/day: 0.00    Types: Cigarettes    Start date: 04/15/1984    Quit date: 04/15/1989    Years since quitting: 33.9   Smokeless tobacco: Never  Vaping Use   Vaping status: Never Used  Substance and Sexual Activity   Alcohol use: Not Currently    Alcohol/week: 1.0 standard drink of alcohol    Types: 1 Glasses of wine per week    Comment: occasional wine   Drug use: No   Sexual activity: Not on file  Other Topics Concern   Not on file  Social History Narrative   She is married.  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   Occasional wine, former smoker not now no substances   3 kids plus two step daughters   Social Determinants of Health   Financial Resource Strain: Low Risk  (06/19/2021)   Overall Financial Resource Strain (CARDIA)    Difficulty of Paying Living Expenses: Not hard at all  Food  Insecurity: No Food Insecurity (09/23/2022)   Hunger Vital Sign    Worried About Running Out of Food in the Last Year: Never true    Ran Out of Food in the Last Year: Never true  Transportation Needs: No Transportation Needs (09/23/2022)   PRAPARE - Administrator, Civil Service (Medical): No    Lack of Transportation (Non-Medical): No  Physical Activity: Insufficiently Active (09/23/2022)   Exercise Vital Sign    Days of Exercise per Week: 3 days    Minutes of Exercise per Session: 20 min  Stress: No Stress Concern Present (09/23/2022)   [employer] of Occupational Health - Occupational Stress Questionnaire    Feeling of Stress : Not at all  Social Connections: Socially Integrated (09/23/2022)  Social Advertising account executive [NHANES]    Frequency of Communication with Friends and Family: More than three times a week    Frequency of Social Gatherings with Friends and Family: More than three times a week    Attends Religious Services: More than 4 times per year    Active Member of [employer] or Organizations: Yes    Attends Engineer, structural: More than 4 times per year    Marital Status: Married    Current Medications:  Current Outpatient Medications:    albuterol (VENTOLIN HFA) 108 (90 Base) MCG/ACT inhaler, Inhale 2 puffs into the lungs every 6 (six) hours as needed for wheezing or shortness of breath., Disp: 18 g, Rfl: 1   alendronate (FOSAMAX) 70 MG tablet, TAKE 1 TABLET(70 MG) BY MOUTH EVERY 7 DAYS WITH A FULL GLASS OF WATER AND ON AN EMPTY STOMACH, Disp: 4 tablet, Rfl: 11   atorvastatin (LIPITOR) 10 MG tablet, TAKE 1 TABLET(10 MG) BY MOUTH DAILY, Disp: 90 tablet, Rfl: 3   cyclobenzaprine (FLEXERIL) 5 MG tablet, , Disp: , Rfl:    dicyclomine (BENTYL) 10 MG capsule, Take 1 capsule (10 mg total) by mouth 3 (three) times daily between meals as needed for spasms., Disp: 270 capsule, Rfl: 3   famotidine (PEPCID) 20 MG tablet, Take 1 tablet (20 mg total) by  mouth 2 (two) times daily., Disp: 180 tablet, Rfl: 1   fexofenadine (ALLERGY RELIEF) 180 MG tablet, Take 1 tablet (180 mg total) by mouth daily as needed for allergies or rhinitis (Can take an extra dose during flare ups.)., Disp: 180 tablet, Rfl: 1   fluconazole (DIFLUCAN) 150 MG tablet, Take 1 tablet (150 mg total) by mouth daily., Disp: 1 tablet, Rfl: 0   imiquimod (ALDARA) 5 % cream, Apply topically 3 (three) times a week. Apply to right and left vulva 3 times a week, use for 12 weeks, Disp: 12 each, Rfl: 1   lansoprazole (PREVACID) 15 MG capsule, Take 1 capsule (15 mg total) by mouth 2 (two) times daily before a meal., Disp: 180 capsule, Rfl: 1   losartan-hydrochlorothiazide (HYZAAR) 100-25 MG tablet, TAKE 1 TABLET BY MOUTH EVERY DAY, Disp: 90 tablet, Rfl: 1   montelukast (SINGULAIR) 10 MG tablet, Take 1 tablet (10 mg total) by mouth at bedtime., Disp: 90 tablet, Rfl: 3   triamcinolone cream (KENALOG) 0.1 %, Apply topically 2 (two) times daily., Disp: , Rfl:   Review of Systems: On ROS intake form: +vulva white on edges of left side Denies appetite changes, fevers, chills, fatigue, unexplained weight changes. Denies hearing loss, neck lumps or masses, mouth sores, ringing in ears or voice changes. Denies cough or wheezing.  Denies shortness of breath. Denies chest pain or palpitations. Denies leg swelling. Denies abdominal distention, pain, blood in stools, constipation, diarrhea, nausea, vomiting, or early satiety. Denies pain with intercourse, dysuria, frequency, hematuria or incontinence. Denies hot flashes, pelvic pain, vaginal bleeding or vaginal discharge.   Denies joint pain, back pain or muscle pain/cramps. Denies itching, rash, or wounds. Denies dizziness, headaches, numbness or seizures. Denies swollen lymph nodes or glands, denies easy bruising or bleeding. Denies anxiety, depression, confusion, or decreased concentration.  Physical Exam: BP 130/80 (BP Location: Right Arm,  Patient Position: Sitting)   Pulse 70   Temp 98.5 F (36.9 C) (Oral)   Resp 20   Wt 192 lb 9.6 oz (87.4 kg)   SpO2 98%   BMI 33.58 kg/m  General: Alert, oriented, no acute distress.  GU: Exam performed with chaperone present.  Previously noted areas on the right lower vulva and left mid vulva have completely resolved. Along the left inferior aspect of the clitoral hood, there is a small area where it looks like the top layer of skin was removed with now a whitened raised area in the center.  There is no surrounding erythema or evidence of irritation around this.  The left vulva itself is non-erythematous without any other visible lesions.  Vulvar biopsy procedure Preoperative diagnosis: history of pagets of the vulva, visible white lesion on left vulva as reported symptom Postoperative diagnosis: Same as above Provider: Warner Mccreedy NP Estimated blood loss: Minimal Specimens: left upper vulva Procedure: After the procedure was discussed with the patient including risks and benefits, she gave verbal/written consent.  She was then placed in dorsolithotomy position the left vulvar area to be biopsied was examined with findings as above.  The area was cleansed with Betadine x 3 with plan for biopsy at the superior aspect of the leukoplakia.  1 cc of 2% lidocaine was then injected.  After adequate time for anesthesia was given, a biopsy was taken with the use of a tischler forcep.  This was placed in formalin.  Silver nitrate and pressure were used to achieve hemostasis.  Overall the patient tolerated the procedure well.  Laboratory & Radiologic Studies: 11/16/22: left vulvar biopsy - Vulvar tissue with involvement by patient's known extramammary Paget's  disease  - Margins not involved   Assessment & Plan: [name] is a 72 y.o. woman with a history of recurrent Paget's disease of the vulva.   Discussed exam findings with the patient. Since area still visible along the left aspect of  the clitoral hood with new leukoplakial changes, we performed a biopsy today. Biopsy site care discussed. She will be contacted with the results when available. She is advised to call for any needs, concerns, or new symptoms.  20 minutes of total time was spent for this patient encounter, including preparation, face-to-face counseling with the patient and coordination of care, and documentation of the encounter.  Warner Mccreedy NP [facility] Oncology

## 2023-04-05 LAB — SURGICAL PATHOLOGY

## 2023-04-06 ENCOUNTER — Inpatient Hospital Stay: Payer: Medicare Other | Admitting: Gynecologic Oncology

## 2023-04-06 ENCOUNTER — Telehealth: Payer: Self-pay

## 2023-04-06 NOTE — Telephone Encounter (Signed)
Pt is aware of recent biopsy results per message from Warner Mccreedy NP. She states she is so glad it wasn't Pagets. She will continue to monitor and will call us back with concerns.

## 2023-04-06 NOTE — Telephone Encounter (Signed)
-----   Message from Doylene Bode sent at 04/06/2023  9:24 AM EDT ----- Please let the patient know the biopsy we took in the office is showing acute inflammation. No pagets. I would advise continuing to monitor the area, avoid scratching, could use topical neosporin or vaseline in this area while it heals, and give it time to heal then see how the skin is. She can call if after this has healed still having itching etc.

## 2023-04-14 DIAGNOSIS — G5601 Carpal tunnel syndrome, right upper limb: Secondary | ICD-10-CM | POA: Diagnosis not present

## 2023-04-18 ENCOUNTER — Telehealth: Payer: Self-pay | Admitting: *Deleted

## 2023-04-18 NOTE — Telephone Encounter (Signed)
Spoke with [name] and advised to watch the area, do not put Aldara cream on it, this would only be for areas of pagets.  If any changes please reach out to the office. Pt verbalized understanding and thanked the office for calling back.

## 2023-04-18 NOTE — Telephone Encounter (Addendum)
Spoke with Ms. Bady who called the office to report that she has an area on her left vulva that "looks like a scar or shallow like a crater" in the same area of the white (paget's) from visit 11/16/22 with Warner Mccreedy, NP. She states this area is not white, it's the color of vulva tissue.  Pt states she hasn't used the Aldara cream since late August early September that was the 12 week mark from starting the cream in June. And wants to know if she should put Aldara on the area in question.  Pt denies pain, fever and or chills. Pt is not having any burning, itching or discharge. States she noticed the area after feeling a pinch, "like the end of a toothpick"  and used peri bottle and was able to see the area.  Advised patient that her message would be relayed to provider and the office would call back with recommendations.

## 2023-04-20 DIAGNOSIS — L304 Erythema intertrigo: Secondary | ICD-10-CM | POA: Diagnosis not present

## 2023-04-20 DIAGNOSIS — L918 Other hypertrophic disorders of the skin: Secondary | ICD-10-CM | POA: Diagnosis not present

## 2023-04-20 DIAGNOSIS — L720 Epidermal cyst: Secondary | ICD-10-CM | POA: Diagnosis not present

## 2023-04-20 DIAGNOSIS — L538 Other specified erythematous conditions: Secondary | ICD-10-CM | POA: Diagnosis not present

## 2023-04-20 DIAGNOSIS — L820 Inflamed seborrheic keratosis: Secondary | ICD-10-CM | POA: Diagnosis not present

## 2023-04-20 DIAGNOSIS — L738 Other specified follicular disorders: Secondary | ICD-10-CM | POA: Diagnosis not present

## 2023-04-28 DIAGNOSIS — K080 Exfoliation of teeth due to systemic causes: Secondary | ICD-10-CM | POA: Diagnosis not present

## 2023-04-29 DIAGNOSIS — M25631 Stiffness of right wrist, not elsewhere classified: Secondary | ICD-10-CM | POA: Diagnosis not present

## 2023-04-29 DIAGNOSIS — M25531 Pain in right wrist: Secondary | ICD-10-CM | POA: Diagnosis not present

## 2023-05-04 ENCOUNTER — Other Ambulatory Visit: Payer: Self-pay | Admitting: Allergy

## 2023-05-11 DIAGNOSIS — M25531 Pain in right wrist: Secondary | ICD-10-CM | POA: Diagnosis not present

## 2023-05-11 DIAGNOSIS — M25631 Stiffness of right wrist, not elsewhere classified: Secondary | ICD-10-CM | POA: Diagnosis not present

## 2023-05-18 DIAGNOSIS — M25631 Stiffness of right wrist, not elsewhere classified: Secondary | ICD-10-CM | POA: Diagnosis not present

## 2023-05-18 DIAGNOSIS — M25531 Pain in right wrist: Secondary | ICD-10-CM | POA: Diagnosis not present

## 2023-05-24 DIAGNOSIS — K080 Exfoliation of teeth due to systemic causes: Secondary | ICD-10-CM | POA: Diagnosis not present

## 2023-05-26 DIAGNOSIS — G5601 Carpal tunnel syndrome, right upper limb: Secondary | ICD-10-CM | POA: Diagnosis not present

## 2023-05-26 DIAGNOSIS — M25631 Stiffness of right wrist, not elsewhere classified: Secondary | ICD-10-CM | POA: Diagnosis not present

## 2023-05-26 DIAGNOSIS — M25531 Pain in right wrist: Secondary | ICD-10-CM | POA: Diagnosis not present

## 2023-06-13 ENCOUNTER — Other Ambulatory Visit: Payer: Self-pay | Admitting: Family Medicine

## 2023-06-13 ENCOUNTER — Other Ambulatory Visit: Payer: Self-pay | Admitting: Allergy

## 2023-06-16 DIAGNOSIS — D235 Other benign neoplasm of skin of trunk: Secondary | ICD-10-CM | POA: Diagnosis not present

## 2023-06-16 DIAGNOSIS — L740 Miliaria rubra: Secondary | ICD-10-CM | POA: Diagnosis not present

## 2023-06-16 DIAGNOSIS — D485 Neoplasm of uncertain behavior of skin: Secondary | ICD-10-CM | POA: Diagnosis not present

## 2023-08-12 ENCOUNTER — Telehealth: Payer: Self-pay

## 2023-08-12 MED ORDER — OSELTAMIVIR PHOSPHATE 75 MG PO CAPS: 75.0000 mg | ORAL_CAPSULE | Freq: Every day | ORAL | 0 refills | Status: DC

## 2023-08-12 NOTE — Telephone Encounter (Signed)
 Please let the patient know I have sent in the Tamiflu prophylaxis.  If she begins to have symptoms please let us now.

## 2023-08-12 NOTE — Telephone Encounter (Signed)
 Copied from CRM [id number]. Topic: General - Other >> Aug 12, 2023  4:05 PM Truddie Crumble wrote: Reason for CRM: patient called stating she was exposed to the flu and she would like to be prescribed tamiflu

## 2023-08-12 NOTE — Addendum Note (Signed)
 Addended byKerby Nora E on: 08/12/2023 05:00 PM   Modules accepted: Orders

## 2023-08-12 NOTE — Telephone Encounter (Addendum)
 [name] notified that Tamiflu has been sent in to her pharmacy as requested. I advised that if she becomes symptomatic she needs to let us now.   She ask if it is okay to babysit grandson on Monday.  Per Dr. Ermalene Searing it should be okay to be around grandson on Monday unless she becomes symptomatic.  Then she should make other arrangements.  Patient states understanding.

## 2023-08-12 NOTE — Telephone Encounter (Signed)
 Please sign order below if okay to send in .

## 2023-08-12 NOTE — Addendum Note (Signed)
 Addended by: Damita Lack on: 08/12/2023 04:34 PM   Modules accepted: Orders

## 2023-08-15 ENCOUNTER — Other Ambulatory Visit (HOSPITAL_COMMUNITY): Payer: Self-pay

## 2023-08-15 ENCOUNTER — Telehealth: Payer: Self-pay

## 2023-08-15 NOTE — Telephone Encounter (Signed)
 Pharmacy Patient Advocate Encounter   Received notification from CoverMyMeds that prior authorization for Dicyclomine 10 mg capsules is required/requested.   Insurance verification completed.   The patient is insured through [employer]  .   Per test claim: PA required; PA submitted to above mentioned insurance via CoverMyMeds Key/confirmation #/EOC [id number] Status is pending

## 2023-08-15 NOTE — Telephone Encounter (Signed)
 Pharmacy Patient Advocate Encounter  Received notification from [facility] MEDICARE  that Prior Authorization for DICYCLOMINE 10MG  has been APPROVED from 3.10.25 to 3.10.26. Ran test claim, Copay is $18. This test claim was processed through [facility]- copay amounts may vary at other pharmacies due to pharmacy/plan contracts, or as the patient moves through the different stages of their insurance plan.   PA #/Case ID/Reference #: [id number]

## 2023-08-16 ENCOUNTER — Ambulatory Visit (INDEPENDENT_AMBULATORY_CARE_PROVIDER_SITE_OTHER): Admitting: Family Medicine

## 2023-08-16 ENCOUNTER — Encounter: Payer: Self-pay | Admitting: Family Medicine

## 2023-08-16 VITALS — BP 140/80 | HR 74 | Temp 98.0°F | Ht 63.5 in | Wt 194.5 lb

## 2023-08-16 DIAGNOSIS — R051 Acute cough: Secondary | ICD-10-CM | POA: Diagnosis not present

## 2023-08-16 DIAGNOSIS — J4540 Moderate persistent asthma, uncomplicated: Secondary | ICD-10-CM

## 2023-08-16 LAB — POC COVID19 BINAXNOW: SARS Coronavirus 2 Ag: NEGATIVE

## 2023-08-16 LAB — POC INFLUENZA A&B (BINAX/QUICKVUE)
Influenza A, POC: NEGATIVE
Influenza B, POC: NEGATIVE

## 2023-08-16 MED ORDER — OSELTAMIVIR PHOSPHATE 75 MG PO CAPS: 75.0000 mg | ORAL_CAPSULE | Freq: Two times a day (BID) | ORAL | 0 refills | Status: DC

## 2023-08-16 MED ORDER — PREDNISONE 20 MG PO TABS: ORAL_TABLET | ORAL | 0 refills | Status: DC

## 2023-08-16 NOTE — Assessment & Plan Note (Signed)
 Acute, likely influenza with false negative test given on Tamiflu, recent exposure.  Patient with mild asthma exacerbation. Will complete remainder of the Tamiflu at higher dose for treatment at 75 mg twice daily.  Prescription for additional 2 days sent to pharmacy. Will treat with prednisone taper.  She can use albuterol 2 puffs inhaled every 4-6 hours as needed for wheeze. Continue allergy medications ,Singulair 10 mg p.o. daily Return and ER precautions provided.

## 2023-08-16 NOTE — Progress Notes (Signed)
 Patient ID: [name], female    DOB: 04/06/1951, 73 y.o.   MRN: [id number]  This visit was conducted in person.  BP (!) 140/80 (BP Location: Left Arm, Patient Position: Sitting, Cuff Size: Large)   Pulse 74   Temp 98 F (36.7 C) (Temporal)   Ht 5' 3.5" (1.613 m)   Wt 194 lb 8 oz (88.2 kg)   SpO2 95%   BMI 33.91 kg/m    CC:  Chief Complaint  Patient presents with   Cough    Started Sunday-Exposed to Flu 3/7 and started on Tamiflu on Friday   Nasal Congestion   Wheezing    Subjective:   HPI: [name] is a 73 y.o. female presenting on 08/16/2023 for Cough (Started Sunday-Exposed to Flu 3/7 and started on Tamiflu on Friday), Nasal Congestion, and Wheezing  Exposed to flu on 3/7... started preventative tamiflu  Onday 3 of tamiflu.. she started having symptoms.   Date of onset: 3 days Initial symptoms included  coughing Symptoms progressed to productive cpough, no HA,  runny nose, post nasal drip  No fever.  NO body aches  Has SOB   She has noted wheeze.    Sick contacts:  COVID testing:   none     She has tried to treat with  albuterol as needed once  On singulair.    Has asthma, moderate persistent. Non-smoker.         Relevant past medical, surgical, family and social history reviewed and updated as indicated. Interim medical history since our last visit reviewed. Allergies and medications reviewed and updated. Outpatient Medications Prior to Visit  Medication Sig Dispense Refill   albuterol (VENTOLIN HFA) 108 (90 Base) MCG/ACT inhaler Inhale 2 puffs into the lungs every 6 (six) hours as needed for wheezing or shortness of breath. 18 g 1   alendronate (FOSAMAX) 70 MG tablet TAKE 1 TABLET(70 MG) BY MOUTH EVERY 7 DAYS WITH A FULL GLASS OF WATER AND ON AN EMPTY STOMACH 4 tablet 11   ALLERGY RELIEF 180 MG tablet TAKE 1 TABLET BY MOUTH EVERY DAY AS NEEDED FOR ALLERGIES MAY TAKE EXTRA DOSE DURING FLARE UPS 180 tablet 1   atorvastatin (LIPITOR) 10 MG  tablet TAKE 1 TABLET(10 MG) BY MOUTH DAILY 90 tablet 3   cyclobenzaprine (FLEXERIL) 5 MG tablet      dicyclomine (BENTYL) 10 MG capsule Take 1 capsule (10 mg total) by mouth 3 (three) times daily between meals as needed for spasms. 270 capsule 3   famotidine (PEPCID) 20 MG tablet TAKE 1 TABLET(20 MG) BY MOUTH TWICE DAILY 180 tablet 1   imiquimod (ALDARA) 5 % cream Apply topically 3 (three) times a week. Apply to right and left vulva 3 times a week, use for 12 weeks 12 each 1   ketoconazole (NIZORAL) 2 % cream Apply 1 Application topically daily as needed.     lansoprazole (PREVACID) 15 MG capsule Take 1 capsule (15 mg total) by mouth 2 (two) times daily before a meal. 180 capsule 1   losartan-hydrochlorothiazide (HYZAAR) 100-25 MG tablet TAKE 1 TABLET BY MOUTH EVERY DAY 90 tablet 1   montelukast (SINGULAIR) 10 MG tablet Take 1 tablet (10 mg total) by mouth at bedtime. 90 tablet 3   triamcinolone cream (KENALOG) 0.1 % Apply topically 2 (two) times daily.     oseltamivir (TAMIFLU) 75 MG capsule Take 1 capsule (75 mg total) by mouth daily. 10 capsule 0   fluconazole (DIFLUCAN) 150 MG  tablet Take 1 tablet (150 mg total) by mouth daily. 1 tablet 0   No facility-administered medications prior to visit.     Per HPI unless specifically indicated in ROS section below Review of Systems  Constitutional:  Negative for fatigue and fever.  HENT:  Positive for congestion and postnasal drip. Negative for sinus pressure, sinus pain and sore throat.   Eyes:  Negative for pain.  Respiratory:  Positive for cough and wheezing. Negative for shortness of breath.   Cardiovascular:  Negative for chest pain, palpitations and leg swelling.  Gastrointestinal:  Negative for abdominal pain.  Genitourinary:  Negative for dysuria and vaginal bleeding.  Musculoskeletal:  Negative for back pain.  Neurological:  Negative for syncope, light-headedness and headaches.  Psychiatric/Behavioral:  Negative for dysphoric mood.     Objective:  BP (!) 140/80 (BP Location: Left Arm, Patient Position: Sitting, Cuff Size: Large)   Pulse 74   Temp 98 F (36.7 C) (Temporal)   Ht 5' 3.5" (1.613 m)   Wt 194 lb 8 oz (88.2 kg)   SpO2 95%   BMI 33.91 kg/m   Wt Readings from Last 3 Encounters:  08/16/23 194 lb 8 oz (88.2 kg)  04/01/23 192 lb 9.6 oz (87.4 kg)  03/25/23 194 lb (88 kg)      Physical Exam Constitutional:      General: She is not in acute distress.    Appearance: She is well-developed. She is not ill-appearing or toxic-appearing.  HENT:     Head: Normocephalic.     Right Ear: Hearing, tympanic membrane, ear canal and external ear normal. Tympanic membrane is not erythematous, retracted or bulging.     Left Ear: Hearing, tympanic membrane, ear canal and external ear normal. Tympanic membrane is not erythematous, retracted or bulging.     Nose: Mucosal edema and rhinorrhea present.     Right Sinus: No maxillary sinus tenderness or frontal sinus tenderness.     Left Sinus: No maxillary sinus tenderness or frontal sinus tenderness.     Mouth/Throat:     Pharynx: Uvula midline.  Eyes:     General: Lids are normal. Lids are everted, no foreign bodies appreciated.     Conjunctiva/sclera: Conjunctivae normal.     Pupils: Pupils are equal, round, and reactive to light.  Neck:     Thyroid: No thyroid mass or thyromegaly.     Vascular: No carotid bruit.     Trachea: Trachea normal.  Cardiovascular:     Rate and Rhythm: Normal rate and regular rhythm.     Pulses: Normal pulses.     Heart sounds: Normal heart sounds, S1 normal and S2 normal. No murmur heard.    No friction rub. No gallop.  Pulmonary:     Effort: Pulmonary effort is normal. No tachypnea or respiratory distress.     Breath sounds: Normal breath sounds. No decreased breath sounds, wheezing, rhonchi or rales.  Musculoskeletal:     Cervical back: Normal range of motion and neck supple.  Skin:    General: Skin is warm and dry.     Findings: No  rash.  Neurological:     Mental Status: She is alert.  Psychiatric:        Mood and Affect: Mood is not anxious or depressed.        Speech: Speech normal.        Behavior: Behavior normal. Behavior is cooperative.        Judgment: Judgment normal.  Results for orders placed or performed in visit on 08/16/23  POC COVID-19   Collection Time: 08/16/23 12:13 PM  Result Value Ref Range   SARS Coronavirus 2 Ag Negative Negative  POC Influenza A&B (Binax test)   Collection Time: 08/16/23 12:13 PM  Result Value Ref Range   Influenza A, POC Negative Negative   Influenza B, POC Negative Negative    Assessment and Plan  Acute cough Assessment & Plan: Acute, likely influenza with false negative test given on Tamiflu, recent exposure.  Patient with mild asthma exacerbation. Will complete remainder of the Tamiflu at higher dose for treatment at 75 mg twice daily.  Prescription for additional 2 days sent to pharmacy. Will treat with prednisone taper.  She can use albuterol 2 puffs inhaled every 4-6 hours as needed for wheeze. Continue allergy medications ,Singulair 10 mg p.o. daily Return and ER precautions provided.  Orders: -     POC COVID-19 BinaxNow -     POC Influenza A&B(BINAX/QUICKVUE)  Moderate persistent asthma without complication  Other orders -     Oseltamivir Phosphate; Take 1 capsule (75 mg total) by mouth 2 (two) times daily.  Dispense: 4 capsule; Refill: 0 -     predniSONE; 3 tabs by mouth daily x 3 days, then 2 tabs by mouth daily x 2 days then 1 tab by mouth daily x 2 days  Dispense: 15 tablet; Refill: 0    No follow-ups on file.   Kerby Nora, MD

## 2023-09-11 ENCOUNTER — Other Ambulatory Visit: Payer: Self-pay | Admitting: Physician Assistant

## 2023-10-12 ENCOUNTER — Ambulatory Visit: Admitting: Gynecologic Oncology

## 2023-10-12 ENCOUNTER — Telehealth: Payer: Self-pay | Admitting: *Deleted

## 2023-10-12 NOTE — Telephone Encounter (Signed)
 Could she please be scheduled for a visit? If no room on my schedule, she can see [name]. Thank you!

## 2023-10-12 NOTE — Telephone Encounter (Signed)
 Spoke with Ms. Uvalle and offered an appt. With Dr. Orvil Bland on Friday, May 16 th at 1:30 pm. Pt agreed and thanked the office for calling.

## 2023-10-12 NOTE — Telephone Encounter (Signed)
 Spoke with [name] who called the office wanting to relay a message to Dr. Orvil Bland. Pt states she has two spots on the interior edge of her labia that are white and she has concerns for recurrence of Paget's. Pt denies fever, chills, pain, itching or discharge. Advised patient that her message would be relayed to providers and the office would call back with recommendations.

## 2023-10-13 DIAGNOSIS — L089 Local infection of the skin and subcutaneous tissue, unspecified: Secondary | ICD-10-CM | POA: Diagnosis not present

## 2023-10-19 ENCOUNTER — Encounter: Payer: Self-pay | Admitting: Gynecologic Oncology

## 2023-10-21 ENCOUNTER — Inpatient Hospital Stay: Attending: Gynecologic Oncology | Admitting: Gynecologic Oncology

## 2023-10-21 ENCOUNTER — Encounter: Payer: Self-pay | Admitting: Gynecologic Oncology

## 2023-10-21 VITALS — BP 128/61 | HR 66 | Temp 98.0°F | Resp 17 | Ht 63.5 in | Wt 197.2 lb

## 2023-10-21 DIAGNOSIS — N9089 Other specified noninflammatory disorders of vulva and perineum: Secondary | ICD-10-CM | POA: Diagnosis not present

## 2023-10-21 DIAGNOSIS — Z9079 Acquired absence of other genital organ(s): Secondary | ICD-10-CM | POA: Insufficient documentation

## 2023-10-21 DIAGNOSIS — C519 Malignant neoplasm of vulva, unspecified: Secondary | ICD-10-CM

## 2023-10-21 DIAGNOSIS — Z8544 Personal history of malignant neoplasm of other female genital organs: Secondary | ICD-10-CM | POA: Insufficient documentation

## 2023-10-21 NOTE — Progress Notes (Signed)
 Gynecologic Oncology Return Clinic Visit  10/21/23  Reason for Visit: follow-up   Treatment History: The patient previously had a supracervical hysterectomy but has her ovaries and cervix in situ. Surgery for Paget's disease of the vulva in October 2011.  There was focal involvement of the medial margin.  The entire lesion is approximately 5 cm in diameter.  She then underwent wide local excision of a recurrent Paget's lesion in August 2016.  She had surgical margins that were focally positive and subsequently underwent another excision on 04/10/2015 with negative margins. She had a vulvar biopsy from the right in November 2017 that was negative.   She was seen for a visible lesion and vulvar symptoms in April 2020 and a biopsy of this lesion was consistent with Paget's disease. On 11/07/2018, she underwent partial left vulvectomy with findings of a 1 cm white hyperkeratotic lesion on the left labia minora close to the clitoris.  Margins were positive.  Plan was for consideration of Aldara  since repeat excision could involve removal of the clitoris. Patient was seen on 01/02/2019 with a slightly erythematous area on the right vulva.  Plan was for repeat examination in September with biopsy if persistence of the lesion. On 9/20, patient underwent right vulvar biopsy which revealed extramammary Paget's disease.  After discussion of treatment options, the patient opted for Aldara  use. She was seen in early December, at which time her right vulvar lesion had completely resolved.  At this visit, a small right periclitoral lesion was noted. Given her excellent response to the previous lesion, her decision was to proceed with Aldara  use for this new lesion. Saw me on 07/23/19 with a right peri-clitoral lesion suspected to be side effect from Aldara  cream use. Seen on 02/29/20 with no evidence of disease on exam after 3 months not using Aldara .  Seen in 08/2020, asymptomatic, off treatment. Seen on 02/13/2021:  asymptomatic with no concerning exam findings. Seen in 11/2022: new vulvar lesions, biopsy showed Paget's disease. Started treatment with Aldara  in two locations.  Interval History: Patient is overall doing well.  She has noticed an area of white on her left vulva.  Denies any symptoms including pruritus, pain, discharge, irritation, or bleeding.  Endorses allergies, cough.  Also completing treatment with Keflex for what appears to be folliculitis at the level of her pannus.  Past Medical/Surgical History: 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  02-17-2011   PULLEY RELEASE RIGHT THUMB  03-23-2011   SIMPLE VULVECTOMY  03-24-2010   right side   TONSILLECTOMY  1977   TRIGGER FINGER RELEASE     VAGINAL HYSTERECTOMY  1989   and Anterior and posterior repair's for prolapse partial   VULVECTOMY Right 01/10/2015   Procedure: RIGHT WIDE LOCAL EXCISION VULVECTOMY;  Surgeon: Bearl Limes  Clarke-Pearson, MD;  Location: [facility];  Service: Gynecology;  Laterality: Right;   VULVECTOMY Right 04/10/2015   Procedure: WIDE LOCAL EXCISION VULVA;  Surgeon: Valeen Gartner, MD;  Location[facility];  Service: Gynecology;  Laterality: Right;   VULVECTOMY PARTIAL N/A 11/07/2018   Procedure: VULVECTOMY PARTIAL;  Surgeon: Andra Kava, MD;  Location: [facility];  Service: Gynecology;   Laterality: N/A;   WISDOM TOOTH EXTRACTION      Family History  Problem Relation Age of Onset   Cancer Maternal Grandmother    Hypertension Father    Heart disease Father    Asthma Father    Hypertension Mother    Heart disease Mother    Breast cancer Paternal Grandmother 77   Colon cancer Paternal Grandmother    Colon cancer Paternal Uncle    Allergic rhinitis Neg Hx    Angioedema Neg Hx    Eczema Neg Hx    Immunodeficiency Neg Hx    Urticaria Neg Hx    Esophageal cancer Neg Hx    Rectal cancer Neg Hx    Stomach cancer Neg Hx     Social History   Socioeconomic History   Marital status: Married    Spouse name: Not on file   Number of children: 3   Years of education: Not on file   Highest education level: Not on file  Occupational History   Occupation: retired  Tobacco Use   Smoking status: Former    Current packs/day: 0.00    Types: Cigarettes    Start date: 04/15/1984    Quit date: 04/15/1989    Years since quitting: 34.5   Smokeless tobacco: Never  Vaping Use   Vaping status: Never Used  Substance and Sexual Activity   Alcohol use: Not Currently    Alcohol/week: 1.0 standard drink of alcohol    Types: 1 Glasses of wine per week    Comment: occasional wine   Drug use: No   Sexual activity: Not on file  Other Topics Concern   Not on file  Social History Narrative   She is married.  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   Occasional wine, former smoker not now no substances   3 kids plus two step daughters   Social Drivers of Corporate investment banker Strain: Low Risk  (06/19/2021)   Overall Financial Resource Strain (CARDIA)    Difficulty of Paying Living Expenses: Not hard at all  Food Insecurity: No Food Insecurity (09/23/2022)   Hunger Vital Sign    Worried About Running Out of Food in the Last Year: Never true    Ran Out of Food in the Last Year: Never true  Transportation Needs: No Transportation Needs (09/23/2022)   PRAPARE - Therapist, art (Medical): No    Lack of Transportation (Non-Medical): No  Physical Activity: Insufficiently Active (09/23/2022)   Exercise Vital Sign    Days of Exercise per Week: 3 days    Minutes of Exercise per Session: 20 min  Stress: No Stress Concern Present (09/23/2022)   [employer] of Occupational Health - Occupational Stress Questionnaire    Feeling of Stress : Not at all  Social Connections: Socially Integrated (09/23/2022)   Social Connection and Isolation Panel [NHANES]    Frequency of Communication with Friends and Family: More than three times a week    Frequency of Social Gatherings with Friends and Family: More than three times a week  Attends Religious Services: More than 4 times per year    Active Member of Clubs or Organizations: Yes    Attends Banker Meetings: More than 4 times per year    Marital Status: Married    Current Medications:  Current Outpatient Medications:    albuterol  (VENTOLIN  HFA) 108 (90 Base) MCG/ACT inhaler, Inhale 2 puffs into the lungs every 6 (six) hours as needed for wheezing or shortness of breath., Disp: 18 g, Rfl: 1   alendronate  (FOSAMAX ) 70 MG tablet, TAKE 1 TABLET(70 MG) BY MOUTH EVERY 7 DAYS WITH A FULL GLASS OF WATER AND ON AN EMPTY STOMACH, Disp: 4 tablet, Rfl: 11   ALLERGY  RELIEF 180 MG tablet, TAKE 1 TABLET BY MOUTH EVERY DAY AS NEEDED FOR ALLERGIES MAY TAKE EXTRA DOSE DURING FLARE UPS, Disp: 180 tablet, Rfl: 1   atorvastatin  (LIPITOR) 10 MG tablet, TAKE 1 TABLET(10 MG) BY MOUTH DAILY, Disp: 90 tablet, Rfl: 3   cyclobenzaprine  (FLEXERIL ) 5 MG tablet, , Disp: , Rfl:    dicyclomine  (BENTYL ) 10 MG capsule, TAKE 1 CAPSULE(10 MG) BY MOUTH THREE TIMES DAILY BETWEEN MEALS AS NEEDED FOR SPASMS, Disp: 270 capsule, Rfl: 3   famotidine  (PEPCID ) 20 MG tablet, TAKE 1 TABLET(20 MG) BY MOUTH TWICE DAILY, Disp: 180 tablet, Rfl: 1   imiquimod  (ALDARA ) 5 % cream, Apply topically 3 (three) times a week. Apply to right and  left vulva 3 times a week, use for 12 weeks, Disp: 12 each, Rfl: 1   ketoconazole (NIZORAL) 2 % cream, Apply 1 Application topically daily as needed., Disp: , Rfl:    lansoprazole  (PREVACID ) 15 MG capsule, Take 1 capsule (15 mg total) by mouth 2 (two) times daily before a meal., Disp: 180 capsule, Rfl: 1   losartan -hydrochlorothiazide  (HYZAAR ) 100-25 MG tablet, TAKE 1 TABLET BY MOUTH EVERY DAY, Disp: 90 tablet, Rfl: 1   montelukast  (SINGULAIR ) 10 MG tablet, Take 1 tablet (10 mg total) by mouth at bedtime., Disp: 90 tablet, Rfl: 3   triamcinolone  cream (KENALOG ) 0.1 %, Apply topically 2 (two) times daily., Disp: , Rfl:   Review of Systems: Denies appetite changes, fevers, chills, fatigue, unexplained weight changes. Denies hearing loss, neck lumps or masses, mouth sores, ringing in ears or voice changes. Denies cough or wheezing.  Denies shortness of breath. Denies chest pain or palpitations. Denies leg swelling. Denies abdominal distention, pain, blood in stools, constipation, diarrhea, nausea, vomiting, or early satiety. Denies pain with intercourse, dysuria, frequency, hematuria or incontinence. Denies hot flashes, pelvic pain, vaginal bleeding or vaginal discharge.   Denies joint pain, back pain or muscle pain/cramps. Denies itching, rash, or wounds. Denies dizziness, headaches, numbness or seizures. Denies swollen lymph nodes or glands, denies easy bruising or bleeding. Denies anxiety, depression, confusion, or decreased concentration.  Physical Exam: BP 128/61 (BP Location: Left Arm, Patient Position: Sitting)   Pulse 66   Temp 98 F (36.7 C) (Oral)   Resp 17   Ht 5' 3.5" (1.613 m)   Wt 197 lb 3.2 oz (89.4 kg)   SpO2 98%   BMI 34.38 kg/m  General: Alert, oriented, no acute distress.  GU: Along the mid left inner labia, there is an approximately 1 x 1.5 cm area of mildly eczematoid appearing tissue, slightly white in appearance.  There is some surrounding eczematoid tissue  measuring a total of 2-3 cm.  Vulvar biopsy procedure Preoperative diagnosis: Suspected recurrent Paget's Postoperative diagnosis: Same as above Physician: Orvil Bland MD Estimated blood loss: Minimal Specimens: Vulvar biopsy  3 o'clock Procedure: After the procedure was discussed with the patient including risks and benefits, she gave  consent.  She was then placed in dorsolithotomy position and the vulva was inspected with findings noted above.  The area to be biopsied was identified and cleansed with Betadine x 3.  1 cc of 2% lidocaine  was then injected for local anesthesia.  After adequate time was given, a 3 mm punch biopsy was performed and placed in formalin.  Silver nitrate was used to achieve hemostasis. Overall the patient tolerated the procedure well.    Laboratory & Radiologic Studies: None new  Assessment & Plan: [name] is a 73 y.o. woman a history of recurrent Paget's disease of the vulva. Most recent biopsy in 03/2023 with acute inflammation, no Pagets.   Patient with new vulvar lesion suspicious for recurrent Paget's.  She is completely asymptomatic.  Biopsy performed today.  I will call her with the results next week and we will discuss options for treatment if biopsy shows Paget's.  20 minutes of total time was spent for this patient encounter, including preparation, face-to-face counseling with the patient and coordination of care, and documentation of the encounter.  Wiley Hanger, MD  Division of Gynecologic Oncology  Department of Obstetrics and Gynecology  [facility]

## 2023-10-21 NOTE — Patient Instructions (Signed)
 It was good to see you today.  I will call you with your biopsy results next week.

## 2023-10-24 DIAGNOSIS — M9903 Segmental and somatic dysfunction of lumbar region: Secondary | ICD-10-CM | POA: Diagnosis not present

## 2023-10-24 DIAGNOSIS — M5441 Lumbago with sciatica, right side: Secondary | ICD-10-CM | POA: Diagnosis not present

## 2023-10-24 DIAGNOSIS — M9905 Segmental and somatic dysfunction of pelvic region: Secondary | ICD-10-CM | POA: Diagnosis not present

## 2023-10-24 DIAGNOSIS — M955 Acquired deformity of pelvis: Secondary | ICD-10-CM | POA: Diagnosis not present

## 2023-10-26 LAB — SURGICAL PATHOLOGY

## 2023-10-27 ENCOUNTER — Encounter: Payer: Self-pay | Admitting: Gynecologic Oncology

## 2023-10-27 ENCOUNTER — Ambulatory Visit: Payer: Self-pay | Admitting: Gynecologic Oncology

## 2023-10-28 ENCOUNTER — Telehealth: Payer: Self-pay | Admitting: *Deleted

## 2023-10-28 NOTE — Telephone Encounter (Signed)
 Spoke with Ms. Wages in regards to surgery dates. Pt states that June 19 works for her, Advised that this message would be relayed to providers. Pt also states she will be out of town from June 12th and arriving back on June 17th. Pt thanked the office for calling.

## 2023-11-01 ENCOUNTER — Other Ambulatory Visit: Payer: Self-pay | Admitting: Gynecologic Oncology

## 2023-11-01 ENCOUNTER — Encounter: Payer: Self-pay | Admitting: Gynecologic Oncology

## 2023-11-01 DIAGNOSIS — C519 Malignant neoplasm of vulva, unspecified: Secondary | ICD-10-CM

## 2023-11-01 MED ORDER — TRAMADOL HCL 50 MG PO TABS: 50.0000 mg | ORAL_TABLET | Freq: Four times a day (QID) | ORAL | 0 refills | Status: DC | PRN

## 2023-11-01 MED ORDER — SENNOSIDES-DOCUSATE SODIUM 8.6-50 MG PO TABS: 2.0000 | ORAL_TABLET | Freq: Every day | ORAL | 0 refills | Status: DC

## 2023-11-08 NOTE — Progress Notes (Signed)
 COVID Vaccine received:  [x]  No []  Yes Date of any COVID positive Test in last 90 days: no PCP - Herby Lolling MD Cardiologist - n/a  Chest x-ray -  EKG -   Stress Test -  ECHO -  Cardiac Cath -   Bowel Prep - [x]  No  []   Yes ______  Pacemaker / ICD device [x]  No []  Yes   Spinal Cord Stimulator:[x]  No []  Yes       History of Sleep Apnea? [x]  No []  Yes   CPAP used?- [x]  No []  Yes    Does the patient monitor blood sugar?          [x]  No []  Yes  []  N/A  Patient has: [x]  NO Hx DM   []  Pre-DM                 []  DM1  []   DM2 Does patient have a [employer] or Dexacom? []  No []  Yes   Fasting Blood Sugar Ranges-  Checks Blood Sugar _____ times a day  GLP1 agonist / usual dose - no GLP1 instructions:  SGLT-2 inhibitors / usual dose - no SGLT-2 instructions:   Blood Thinner / Instructions:no Aspirin Instructions:no  Comments:   Activity level: Patient is able  to climb a flight of stairs without difficulty; [x]  No CP  []  No SOB,   Patient can perform ADLs without assistance.   Anesthesia review:   Patient denies shortness of breath, fever, cough and chest pain at PAT appointment.  Patient verbalized understanding and agreement to the Pre-Surgical Instructions that were given to them at this PAT appointment. Patient was also educated of the need to review these PAT instructions again prior to his/her surgery.I reviewed the appropriate phone numbers to call if they have any and questions or concerns.

## 2023-11-08 NOTE — Patient Instructions (Signed)
 SURGICAL WAITING ROOM VISITATION  Patients having surgery or a procedure may have no more than 2 support people in the waiting area - these visitors may rotate.    Children under the age of 69 must have an adult with them who is not the patient.  Due to an increase in RSV and influenza rates and associated hospitalizations, children ages 67 and under may not visit patients in [facility] hospitals.  Visitors with respiratory illnesses are discouraged from visiting and should remain at home.  If the patient needs to stay at the hospital during part of their recovery, the visitor guidelines for inpatient rooms apply. Pre-op nurse will coordinate an appropriate time for 1 support person to accompany patient in pre-op.  This support person may not rotate.    Please refer to the [facility] website for the visitor guidelines for Inpatients (after your surgery is over and you are in a regular room).       Your procedure is scheduled on: 11/24/23   Report to [facility] Main Entrance    Report to admitting at 5:15 AM   Call this number if you have problems the morning of surgery [phone]   Do not eat food :After Midnight.   After Midnight you may have the following liquids until 4:30 AM DAY OF SURGERY  Water Non-Citrus Juices (without pulp, NO RED-Apple, White grape, White cranberry) Black Coffee (NO MILK/CREAM OR CREAMERS, sugar ok)  Clear Tea (NO MILK/CREAM OR CREAMERS, sugar ok) regular and decaf                             Plain Jell-O (NO RED)                                           Fruit ices (not with fruit pulp, NO RED)                                     Popsicles (NO RED)                                                               Sports drinks like Gatorade (NO RED)                  Oral Hygiene is also important to reduce your risk of infection.                                    Remember - BRUSH YOUR TEETH THE MORNING OF SURGERY WITH YOUR REGULAR  TOOTHPASTE  DENTURES WILL BE REMOVED PRIOR TO SURGERY PLEASE DO NOT APPLY "Poly grip" OR ADHESIVES!!!   Stop all vitamins and herbal supplements 7 days before surgery.   Take these medicines the morning of surgery with A SIP OF WATER: prevagen, atorvastatin , famotidine , prevacid , singulair (montelukast )              Do not take Bentyl (dicyclomine ) or Hyzaar (losartan /hydrochlorothiazide ) the morning of surgery.  You may not have any metal on your body including hair pins, jewelry, and body piercing             Do not wear make-up, lotions, powders, perfumes/cologne, or deodorant  Do not wear nail polish including gel and S&S, artificial/acrylic nails, or any other type of covering on natural nails including finger and toenails. If you have artificial nails, gel coating, etc. that needs to be removed by a nail salon please have this removed prior to surgery or surgery may need to be canceled/ delayed if the surgeon/ anesthesia feels like they are unable to be safely monitored.   Do not shave  48 hours prior to surgery.    Do not bring valuables to the hospital. [facility] IS NOT             RESPONSIBLE   FOR VALUABLES.   Contacts, glasses, dentures or bridgework may not be worn into surgery.  DO NOT BRING YOUR HOME MEDICATIONS TO THE HOSPITAL. PHARMACY WILL DISPENSE MEDICATIONS LISTED ON YOUR MEDICATION LIST TO YOU DURING YOUR ADMISSION IN THE HOSPITAL!    Patients discharged on the day of surgery will not be allowed to drive home.  Someone NEEDS to stay with you for the first 24 hours after anesthesia.   Special Instructions: Bring a copy of your healthcare power of attorney and living will documents the day of surgery if you haven't scanned them before.              Please read over the following fact sheets you were given: IF YOU HAVE QUESTIONS ABOUT YOUR PRE-OP INSTRUCTIONS PLEASE CALL [phone] [name]   If you received a COVID test during your pre-op visit  it is  requested that you wear a mask when out in public, stay away from anyone that may not be feeling well and notify your surgeon if you develop symptoms. If you test positive for Covid or have been in contact with anyone that has tested positive in the last 10 days please notify you surgeon.    [facility] - Preparing for Surgery Before surgery, you can play an important role.  Because skin is not sterile, your skin needs to be as free of germs as possible.  You can reduce the number of germs on your skin by washing with CHG (chlorahexidine gluconate) soap before surgery.  CHG is an antiseptic cleaner which kills germs and bonds with the skin to continue killing germs even after washing. Please DO NOT use if you have an allergy  to CHG or antibacterial soaps.  If your skin becomes reddened/irritated stop using the CHG and inform your nurse when you arrive at Short Stay. Do not shave (including legs and underarms) for at least 48 hours prior to the first CHG shower.  You may shave your face/neck.  Please follow these instructions carefully:  1.  Shower with CHG Soap the night before surgery and the  morning of surgery.  2.  If you choose to wash your hair, wash your hair first as usual with your normal  shampoo.  3.  After you shampoo, rinse your hair and body thoroughly to remove the shampoo.                             4.  Use CHG as you would any other liquid soap.  You can apply chg directly to the skin and wash.  Gently with a scrungie  or clean washcloth.  5.  Apply the CHG Soap to your body ONLY FROM THE NECK DOWN.   Do   not use on face/ open                           Wound or open sores. Avoid contact with eyes, ears mouth and   genitals (private parts).                       Wash face,  Genitals (private parts) with your normal soap.             6.  Wash thoroughly, paying special attention to the area where your    surgery  will be performed.  7.  Thoroughly rinse your body with warm water from  the neck down.  8.  DO NOT shower/wash with your normal soap after using and rinsing off the CHG Soap.                9.  Pat yourself dry with a clean towel.            10.  Wear clean pajamas.            11.  Place clean sheets on your bed the night of your first shower and do not  sleep with pets. Day of Surgery : Do not apply any lotions/deodorants the morning of surgery.  Please wear clean clothes to the hospital/surgery center.  FAILURE TO FOLLOW THESE INSTRUCTIONS MAY RESULT IN THE CANCELLATION OF YOUR SURGERY  PATIENT SIGNATURE_________________________________  NURSE SIGNATURE__________________________________  ________________________________________________________________________

## 2023-11-10 ENCOUNTER — Other Ambulatory Visit: Payer: Self-pay

## 2023-11-10 ENCOUNTER — Encounter (HOSPITAL_COMMUNITY)
Admission: RE | Admit: 2023-11-10 | Discharge: 2023-11-10 | Disposition: A | Discharge status: Home / Self Care | Source: Ambulatory Visit | Attending: Gynecologic Oncology | Admitting: Gynecologic Oncology

## 2023-11-10 VITALS — BP 152/75 | HR 61 | Temp 98.2°F | Resp 16 | Ht 63.5 in | Wt 194.0 lb

## 2023-11-10 DIAGNOSIS — Z01818 Encounter for other preprocedural examination: Secondary | ICD-10-CM | POA: Insufficient documentation

## 2023-11-10 DIAGNOSIS — I10 Essential (primary) hypertension: Secondary | ICD-10-CM | POA: Insufficient documentation

## 2023-11-10 LAB — CBC
HCT: 40.8 % (ref 36.0–46.0)
Hemoglobin: 13.7 g/dL (ref 12.0–15.0)
MCH: 31.5 pg (ref 26.0–34.0)
MCHC: 33.6 g/dL (ref 30.0–36.0)
MCV: 93.8 fL (ref 80.0–100.0)
Platelets: 246 10*3/uL (ref 150–400)
RBC: 4.35 MIL/uL (ref 3.87–5.11)
RDW: 12.1 % (ref 11.5–15.5)
WBC: 7.4 10*3/uL (ref 4.0–10.5)
nRBC: 0 % (ref 0.0–0.2)

## 2023-11-10 LAB — BASIC METABOLIC PANEL WITH GFR
Anion gap: 9 (ref 5–15)
BUN: 11 mg/dL (ref 8–23)
CO2: 28 mmol/L (ref 22–32)
Calcium: 9.5 mg/dL (ref 8.9–10.3)
Chloride: 103 mmol/L (ref 98–111)
Creatinine, Ser: 0.79 mg/dL (ref 0.44–1.00)
GFR, Estimated: >60 mL/min (ref >60)
Glucose, Bld: 85 mg/dL (ref 70–99)
Potassium: 3.6 mmol/L (ref 3.5–5.1)
Sodium: 140 mmol/L (ref 135–145)

## 2023-11-12 DIAGNOSIS — J209 Acute bronchitis, unspecified: Secondary | ICD-10-CM | POA: Diagnosis not present

## 2023-11-23 ENCOUNTER — Telehealth: Payer: Self-pay | Admitting: *Deleted

## 2023-11-23 NOTE — Telephone Encounter (Signed)
 Telephone call to check on pre-operative status.  Patient compliant with pre-operative instructions.  Reinforced nothing to eat after midnight. Clear liquids until 0415. Patient to arrive at 0515.  No questions or concerns voiced.  Instructed to call for any needs.

## 2023-11-23 NOTE — Anesthesia Preprocedure Evaluation (Addendum)
 Anesthesia Evaluation  Patient identified by MRN, date of birth, ID band Patient awake    Reviewed: Allergy  & Precautions, NPO status , Patient's Chart, lab work & pertinent test results  History of Anesthesia Complications (+) PONV and history of anesthetic complications  Airway Mallampati: III  TM Distance: >3 FB Neck ROM: Full    Dental  (+) Teeth Intact, Dental Advisory Given   Pulmonary asthma (albuterol  use once a month) , former smoker Quit smoking 1990 Snores loudly, no sleep study    Pulmonary exam normal breath sounds clear to auscultation       Cardiovascular hypertension (148/74 preop), Pt. on medications Normal cardiovascular exam Rhythm:Regular Rate:Normal     Neuro/Psych  Headaches  negative psych ROS   GI/Hepatic Neg liver ROS,GERD  Controlled and Medicated,,  Endo/Other  negative endocrine ROS  BMI 34  Renal/GU negative Renal ROS  negative genitourinary   Musculoskeletal  (+) Arthritis , Osteoarthritis,    Abdominal  (+) + obese  Peds  Hematology negative hematology ROS (+)   Anesthesia Other Findings   Reproductive/Obstetrics Pagets disease vulva                             Anesthesia Physical Anesthesia Plan  ASA: 3  Anesthesia Plan: General   Post-op Pain Management: Tylenol  PO (pre-op)* and Toradol  IV (intra-op)*   Induction:   PONV Risk Score and Plan: 2 and Propofol  infusion, TIVA and Midazolam   Airway Management Planned: LMA  Additional Equipment: None  Intra-op Plan:   Post-operative Plan: Extubation in OR  Informed Consent: I have reviewed the patients History and Physical, chart, labs and discussed the procedure including the risks, benefits and alternatives for the proposed anesthesia with the patient or authorized representative who has indicated his/her understanding and acceptance.     Dental advisory given  Plan Discussed with:  CRNA  Anesthesia Plan Comments:        Anesthesia Quick Evaluation

## 2023-11-24 ENCOUNTER — Other Ambulatory Visit: Payer: Self-pay

## 2023-11-24 ENCOUNTER — Encounter (HOSPITAL_COMMUNITY): Payer: Self-pay | Admitting: Gynecologic Oncology

## 2023-11-24 ENCOUNTER — Ambulatory Visit (HOSPITAL_BASED_OUTPATIENT_CLINIC_OR_DEPARTMENT_OTHER): Payer: Self-pay | Admitting: Anesthesiology

## 2023-11-24 ENCOUNTER — Ambulatory Visit (HOSPITAL_COMMUNITY)
Admission: RE | Admit: 2023-11-24 | Discharge: 2023-11-24 | Disposition: A | Discharge status: Home / Self Care | Attending: Gynecologic Oncology | Admitting: Gynecologic Oncology

## 2023-11-24 ENCOUNTER — Encounter (HOSPITAL_COMMUNITY)
Admission: RE | Disposition: A | Discharge status: Home / Self Care | Payer: Self-pay | Source: Home / Self Care | Attending: Gynecologic Oncology

## 2023-11-24 ENCOUNTER — Ambulatory Visit (HOSPITAL_COMMUNITY): Payer: Self-pay | Admitting: Anesthesiology

## 2023-11-24 DIAGNOSIS — I10 Essential (primary) hypertension: Secondary | ICD-10-CM | POA: Insufficient documentation

## 2023-11-24 DIAGNOSIS — C519 Malignant neoplasm of vulva, unspecified: Secondary | ICD-10-CM | POA: Diagnosis not present

## 2023-11-24 DIAGNOSIS — Z87891 Personal history of nicotine dependence: Secondary | ICD-10-CM | POA: Diagnosis not present

## 2023-11-24 DIAGNOSIS — K219 Gastro-esophageal reflux disease without esophagitis: Secondary | ICD-10-CM | POA: Diagnosis not present

## 2023-11-24 DIAGNOSIS — J4540 Moderate persistent asthma, uncomplicated: Secondary | ICD-10-CM | POA: Diagnosis not present

## 2023-11-24 DIAGNOSIS — N9089 Other specified noninflammatory disorders of vulva and perineum: Secondary | ICD-10-CM | POA: Diagnosis not present

## 2023-11-24 DIAGNOSIS — J45909 Unspecified asthma, uncomplicated: Secondary | ICD-10-CM | POA: Diagnosis not present

## 2023-11-24 HISTORY — PX: VULVECTOMY: SHX1086

## 2023-11-24 SURGERY — WIDE EXCISION VULVECTOMY
Anesthesia: General

## 2023-11-24 MED ORDER — ORAL CARE MOUTH RINSE: 15.0000 mL | Freq: Once | OROMUCOSAL | Status: AC

## 2023-11-24 MED ORDER — CHLORHEXIDINE GLUCONATE 0.12 % MT SOLN
15.0000 mL | Freq: Once | OROMUCOSAL | Status: AC
  Administered 2023-11-24: 15 mL via OROMUCOSAL

## 2023-11-24 MED ORDER — PROPOFOL 10 MG/ML IV BOLUS
INTRAVENOUS | Status: AC
  Filled 2023-11-24: qty 20

## 2023-11-24 MED ORDER — ONDANSETRON HCL 4 MG/2ML IJ SOLN: 4.0000 mg | Freq: Once | INTRAMUSCULAR | Status: DC | PRN

## 2023-11-24 MED ORDER — FENTANYL CITRATE (PF) 100 MCG/2ML IJ SOLN
INTRAMUSCULAR | Status: AC
Start: 2023-11-24 — End: 2023-11-24
  Filled 2023-11-24: qty 2

## 2023-11-24 MED ORDER — PROPOFOL 10 MG/ML IV BOLUS
INTRAVENOUS | Status: DC | PRN
  Administered 2023-11-24: 200 mg via INTRAVENOUS
  Administered 2023-11-24: 125 ug/kg/min via INTRAVENOUS

## 2023-11-24 MED ORDER — EPHEDRINE 5 MG/ML INJ
INTRAVENOUS | Status: AC
  Filled 2023-11-24: qty 5

## 2023-11-24 MED ORDER — ONDANSETRON HCL 4 MG/2ML IJ SOLN
INTRAMUSCULAR | Status: AC
  Filled 2023-11-24: qty 2

## 2023-11-24 MED ORDER — ACETAMINOPHEN 500 MG PO TABS
1000.0000 mg | ORAL_TABLET | ORAL | Status: AC
  Administered 2023-11-24: 1000 mg via ORAL
  Filled 2023-11-24: qty 2

## 2023-11-24 MED ORDER — BUPIVACAINE HCL (PF) 0.25 % IJ SOLN
INTRAMUSCULAR | Status: AC
  Filled 2023-11-24: qty 30

## 2023-11-24 MED ORDER — FENTANYL CITRATE PF 50 MCG/ML IJ SOSY: 25.0000 ug | PREFILLED_SYRINGE | INTRAMUSCULAR | Status: DC | PRN

## 2023-11-24 MED ORDER — LACTATED RINGERS IV SOLN: INTRAVENOUS | Status: DC

## 2023-11-24 MED ORDER — AMISULPRIDE (ANTIEMETIC) 5 MG/2ML IV SOLN: 10.0000 mg | Freq: Once | INTRAVENOUS | Status: DC | PRN

## 2023-11-24 MED ORDER — PROPOFOL 1000 MG/100ML IV EMUL
INTRAVENOUS | Status: AC
  Filled 2023-11-24: qty 100

## 2023-11-24 MED ORDER — LIDOCAINE HCL (CARDIAC) PF 100 MG/5ML IV SOSY
PREFILLED_SYRINGE | INTRAVENOUS | Status: DC | PRN
  Administered 2023-11-24: 60 mg via INTRAVENOUS

## 2023-11-24 MED ORDER — KETOROLAC TROMETHAMINE 30 MG/ML IJ SOLN
INTRAMUSCULAR | Status: AC
  Filled 2023-11-24: qty 1

## 2023-11-24 MED ORDER — DEXAMETHASONE SODIUM PHOSPHATE 10 MG/ML IJ SOLN
INTRAMUSCULAR | Status: AC
  Filled 2023-11-24: qty 1

## 2023-11-24 MED ORDER — EPHEDRINE SULFATE (PRESSORS) 50 MG/ML IJ SOLN
INTRAMUSCULAR | Status: DC | PRN
Start: 2023-11-24 — End: 2023-11-24
  Administered 2023-11-24 (×3): 5 mg via INTRAVENOUS

## 2023-11-24 MED ORDER — BUPIVACAINE HCL (PF) 0.25 % IJ SOLN
INTRAMUSCULAR | Status: DC | PRN
Start: 2023-11-24 — End: 2023-11-24
  Administered 2023-11-24: 10 mL

## 2023-11-24 MED ORDER — LIDOCAINE HCL (PF) 2 % IJ SOLN
INTRAMUSCULAR | Status: AC
  Filled 2023-11-24: qty 5

## 2023-11-24 MED ORDER — OXYCODONE HCL 5 MG/5ML PO SOLN: 5.0000 mg | Freq: Once | ORAL | Status: DC | PRN

## 2023-11-24 MED ORDER — DEXAMETHASONE SODIUM PHOSPHATE 10 MG/ML IJ SOLN
4.0000 mg | INTRAMUSCULAR | Status: AC
  Administered 2023-11-24: 5 mg via INTRAVENOUS

## 2023-11-24 MED ORDER — MIDAZOLAM HCL 2 MG/2ML IJ SOLN
INTRAMUSCULAR | Status: AC
  Filled 2023-11-24: qty 2

## 2023-11-24 MED ORDER — FENTANYL CITRATE (PF) 100 MCG/2ML IJ SOLN
INTRAMUSCULAR | Status: DC | PRN
  Administered 2023-11-24 (×2): 25 ug via INTRAVENOUS
  Administered 2023-11-24: 50 ug via INTRAVENOUS

## 2023-11-24 MED ORDER — OXYCODONE HCL 5 MG PO TABS: 5.0000 mg | ORAL_TABLET | Freq: Once | ORAL | Status: DC | PRN

## 2023-11-24 MED ORDER — ONDANSETRON HCL 4 MG/2ML IJ SOLN
INTRAMUSCULAR | Status: DC | PRN
  Administered 2023-11-24: 4 mg via INTRAVENOUS

## 2023-11-24 MED ORDER — MIDAZOLAM HCL 5 MG/5ML IJ SOLN
INTRAMUSCULAR | Status: DC | PRN
  Administered 2023-11-24: 2 mg via INTRAVENOUS

## 2023-11-24 MED ORDER — 0.9 % SODIUM CHLORIDE (POUR BTL) OPTIME
TOPICAL | Status: DC | PRN
  Administered 2023-11-24: 1000 mL

## 2023-11-24 SURGICAL SUPPLY — 32 items
BAG COUNTER SPONGE SURGICOUNT (BAG) IMPLANT
BLADE SURG 15 STRL LF DISP TIS (BLADE) IMPLANT
DRAPE SHEET LG 3/4 BI-LAMINATE (DRAPES) ×1 IMPLANT
DRAPE SURG IRRIG POUCH 19X23 (DRAPES) ×1 IMPLANT
DRSG TELFA 3X8 NADH STRL (GAUZE/BANDAGES/DRESSINGS) ×1 IMPLANT
GAUZE 4X4 16PLY ~~LOC~~+RFID DBL (SPONGE) ×1 IMPLANT
GAUZE SPONGE 4X4 12PLY STRL (GAUZE/BANDAGES/DRESSINGS) ×1 IMPLANT
GLOVE BIO SURGEON STRL SZ 6 (GLOVE) ×2 IMPLANT
GOWN STRL REUS W/ TWL LRG LVL3 (GOWN DISPOSABLE) ×1 IMPLANT
KIT TURNOVER KIT A (KITS) ×2 IMPLANT
NDL HYPO 21X1.5 SAFETY (NEEDLE) ×1 IMPLANT
NDL HYPO 25X1 1.5 SAFETY (NEEDLE) ×1 IMPLANT
NDL SPNL 22GX7 QUINCKE BK (NEEDLE) IMPLANT
NEEDLE HYPO 21X1.5 SAFETY (NEEDLE) ×1 IMPLANT
NEEDLE HYPO 25X1 1.5 SAFETY (NEEDLE) ×1 IMPLANT
NEEDLE SPNL 22GX7 QUINCKE BK (NEEDLE) IMPLANT
NS IRRIG 1000ML POUR BTL (IV SOLUTION) ×1 IMPLANT
PACK PERINEAL COLD (PAD) ×1 IMPLANT
PACK VAGINAL WOMENS (CUSTOM PROCEDURE TRAY) ×1 IMPLANT
PAD PREP 24X48 CUFFED NSTRL (MISCELLANEOUS) ×1 IMPLANT
SCOPETTES 8 STERILE (MISCELLANEOUS) IMPLANT
SOL PREP POV-IOD 4OZ 10% (MISCELLANEOUS) ×1 IMPLANT
SUT VIC AB 0 CT1 27XBRD ANTBC (SUTURE) ×1 IMPLANT
SUT VIC AB 2-0 SH 27X BRD (SUTURE) ×1 IMPLANT
SUT VIC AB 3-0 SH 27XBRD (SUTURE) ×1 IMPLANT
SUT VIC AB 4-0 PS2 27 (SUTURE) ×1 IMPLANT
SYR BULB IRRIG 60ML STRL (SYRINGE) ×1 IMPLANT
SYR CONTROL 10ML LL (SYRINGE) IMPLANT
TOWEL OR 17X26 10 PK STRL BLUE (TOWEL DISPOSABLE) ×1 IMPLANT
TRAY FOLEY MTR SLVR 16FR STAT (SET/KITS/TRAYS/PACK) IMPLANT
UNDERPAD 30X36 HEAVY ABSORB (UNDERPADS AND DIAPERS) ×1 IMPLANT
YANKAUER SUCT BULB TIP NO VENT (SUCTIONS) ×1 IMPLANT

## 2023-11-24 NOTE — Anesthesia Procedure Notes (Signed)
 Procedure Name: LMA Insertion Date/Time: 11/24/2023 7:36 AM  Performed by: Jinny Mounts, CRNAPre-anesthesia Checklist: Patient identified, Emergency Drugs available, Suction available and Patient being monitored Patient Re-evaluated:Patient Re-evaluated prior to induction Oxygen Delivery Method: Circle System Utilized Preoxygenation: Pre-oxygenation with 100% oxygen Induction Type: IV induction LMA: LMA inserted LMA Size: 4.0 Number of attempts: 1 Airway Equipment and Method: Bite block Placement Confirmation: positive ETCO2 Tube secured with: Tape Dental Injury: Teeth and Oropharynx as per pre-operative assessment

## 2023-11-24 NOTE — Discharge Instructions (Addendum)
 AFTER SURGERY INSTRUCTIONS   Return to work:  1-2 weeks if applicable   We recommend purchasing several bags of frozen green peas and dividing them into ziploc bags. You will want to keep these in the freezer and have them ready to use as ice packs to the vulvar incision. Once the ice pack is no longer cold, you can get another from the freezer. The frozen peas mold to your body better than a regular ice pack.    You can use topical lidocaine  after surgery as a topical gel for pain relief.    Activity: 1. Be up and out of the bed during the day.  Take a nap if needed.  You may walk up steps but be careful and use the hand rail.  Stair climbing will tire you more than you think, you may need to stop part way and rest.    2. No lifting or straining for 4 weeks over 10 pounds. No pushing, pulling, straining for 4 weeks.   3. No driving for minimum 24 hours after surgery but this is usually longer until the following criteria have been met: Do not drive if you are taking narcotic pain medicine and make sure that your reaction time has returned.    4. You can shower as soon as the next day after surgery. Shower daily. No tub baths or submerging your body in water until cleared by your surgeon. If you have the soap that was given to you by pre-surgical testing that was used before surgery, you do not need to use it afterwards because this can irritate your incisions.    5. No sexual activity and nothing in the vagina for 4-6 weeks.   6. You may experience vulvar spotting and discharge after surgery.  The spotting is normal but if you experience heavy bleeding, call our office.   7. Take Tylenol  first for pain if you are able to take these medication and only use narcotic pain medication for severe pain not relieved by the Tylenol .  Monitor your Tylenol  intake to a max of 4,000 mg in a 24 hour period.    Diet: 1. Low sodium Heart Healthy Diet is recommended but you are cleared to resume your normal  (before surgery) diet after your procedure.   2. It is safe to use a laxative, such as Miralax or Colace, if you have difficulty moving your bowels. You have been prescribed Sennakot at bedtime every evening to keep bowel movements regular and to prevent constipation.     Wound Care: 1. Keep clean and dry.  Shower daily.   Reasons to call the Doctor: Fever - Oral temperature greater than 100.4 degrees Fahrenheit Foul-smelling vaginal discharge Difficulty urinating Nausea and vomiting Increased pain at the site of the incision that is unrelieved with pain medicine. Difficulty breathing with or without chest pain New calf pain especially if only on one side Sudden, continuing increased vaginal bleeding with or without clots.   Contacts: For questions or concerns you should contact:   Dr. Wiley Hanger at [phone]   Vira Grieves, NP at [phone]   After Hours: call [phone] and have the GYN Oncologist paged/contacted (after 5 pm or on the weekends).   Messages sent via mychart are for non-urgent matters and are not responded to after hours so for urgent needs, please call the after hours number.

## 2023-11-24 NOTE — H&P (Signed)
 Gynecologic Oncology H&P  11/24/23  Treatment History: The patient previously had a supracervical hysterectomy but has her ovaries and cervix in situ. Surgery for Paget's disease of the vulva in October 2011.  There was focal involvement of the medial margin.  The entire lesion is approximately 5 cm in diameter.  She then underwent wide local excision of a recurrent Paget's lesion in August 2016.  She had surgical margins that were focally positive and subsequently underwent another excision on 04/10/2015 with negative margins. She had a vulvar biopsy from the right in November 2017 that was negative.   She was seen for a visible lesion and vulvar symptoms in April 2020 and a biopsy of this lesion was consistent with Paget's disease. On 11/07/2018, she underwent partial left vulvectomy with findings of a 1 cm white hyperkeratotic lesion on the left labia minora close to the clitoris.  Margins were positive.  Plan was for consideration of Aldara  since repeat excision could involve removal of the clitoris. Patient was seen on 01/02/2019 with a slightly erythematous area on the right vulva.  Plan was for repeat examination in September with biopsy if persistence of the lesion. On 9/20, patient underwent right vulvar biopsy which revealed extramammary Paget's disease.  After discussion of treatment options, the patient opted for Aldara  use. She was seen in early December, at which time her right vulvar lesion had completely resolved.  At this visit, a small right periclitoral lesion was noted. Given her excellent response to the previous lesion, her decision was to proceed with Aldara  use for this new lesion. Saw me on 07/23/19 with a right peri-clitoral lesion suspected to be side effect from Aldara  cream use. Seen on 02/29/20 with no evidence of disease on exam after 3 months not using Aldara .  Seen in 08/2020, asymptomatic, off treatment. Seen on 02/13/2021: asymptomatic with no concerning exam  findings. Seen in 11/2022: new vulvar lesions, biopsy showed Paget's disease. Started treatment with Aldara  in two locations.   Interval History: No new symptoms  Past Medical/Surgical History: 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  02-17-2011   PULLEY RELEASE RIGHT THUMB  03-23-2011   SIMPLE VULVECTOMY  03-24-2010   right side   TONSILLECTOMY  1977   TRIGGER FINGER RELEASE     VAGINAL HYSTERECTOMY  1989   and Anterior and posterior repair's for prolapse partial   VULVECTOMY Right 01/10/2015   Procedure: RIGHT WIDE LOCAL EXCISION VULVECTOMY;  Surgeon: Valeen Gartner, MD;  Location: [facility] [place];  Service: Gynecology;  Laterality: Right;   VULVECTOMY Right 04/10/2015   Procedure: WIDE LOCAL EXCISION VULVA;  Surgeon: Valeen Gartner, MD;  Location: [facility];  Service: Gynecology;  Laterality: Right;   VULVECTOMY PARTIAL N/A 11/07/2018   Procedure: VULVECTOMY PARTIAL;  Surgeon: Andra Kava, MD;  Location: [facility];  Service: Gynecology;  Laterality: N/A;   WISDOM TOOTH EXTRACTION      Family History  Problem Relation Age of Onset   Cancer Maternal Grandmother    Hypertension Father    Heart disease Father    Asthma Father    Hypertension Mother    Heart disease Mother    Breast cancer Paternal Grandmother 78   Colon cancer Paternal Grandmother     Colon cancer Paternal Uncle    Allergic rhinitis Neg Hx    Angioedema Neg Hx    Eczema Neg Hx    Immunodeficiency Neg Hx    Urticaria Neg Hx    Esophageal cancer Neg Hx    Rectal cancer Neg Hx    Stomach cancer Neg Hx     Social History   Socioeconomic History   Marital status: Married    Spouse name: Not on file   Number of children: 3   Years of education: Not on file   Highest education level: Not on file  Occupational History   Occupation: retired  Tobacco Use   Smoking status: Former    Current packs/day: 0.00    Types: Cigarettes    Start date: 04/15/1984    Quit date: 04/15/1989    Years since quitting: 34.6   Smokeless tobacco: Never  Vaping Use   Vaping status: Never Used  Substance and Sexual Activity   Alcohol use: Not Currently    Alcohol/week: 1.0 standard drink of alcohol    Types: 1 Glasses of wine per week    Comment: occasional wine   Drug use: No   Sexual activity: Not on file  Other Topics Concern   Not on file  Social History Narrative   She is married.  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   Occasional wine, former smoker not now no substances   3 kids plus two step daughters   Social Drivers of Corporate investment banker Strain: Low Risk  (06/19/2021)   Overall Financial Resource Strain (CARDIA)    Difficulty of Paying Living Expenses: Not hard at all  Food Insecurity: No Food Insecurity (09/23/2022)   Hunger Vital Sign    Worried About Running Out of Food in the Last Year: Never true    Ran Out of Food in the Last Year: Never true  Transportation Needs: No Transportation Needs (09/23/2022)   PRAPARE - Administrator, Civil Service (Medical): No    Lack of Transportation (Non-Medical): No  Physical Activity: Insufficiently Active (09/23/2022)   Exercise Vital Sign    Days of Exercise per Week: 3 days    Minutes of Exercise per Session: 20 min  Stress: No Stress Concern Present (09/23/2022)   [employer] of Occupational  Health - Occupational Stress Questionnaire    Feeling of Stress : Not at all  Social Connections: Socially Integrated (09/23/2022)   Social Connection and Isolation Panel    Frequency of Communication with Friends and Family: More than three times a week    Frequency of Social Gatherings with Friends and Family: More than three times a week    Attends Religious Services: More than 4 times per year    Active Member of [employer] or Organizations: Yes    Attends Engineer, structural: More than 4 times per year    Marital Status: Married    Current Medications:  Current Facility-Administered Medications:    dexamethasone  (DECADRON ) injection  4 mg, 4 mg, Intravenous, On Call to OR, Cross, [name] D, NP   lactated ringers  infusion, , Intravenous, Continuous, Leslye Rast, MD, Last Rate: 10 mL/hr at 11/24/23 0658, Continued from Pre-op at 11/24/23 0658  Review of Systems: Denies appetite changes, fevers, chills, fatigue, unexplained weight changes. Denies hearing loss, neck lumps or masses, mouth sores, ringing in ears or voice changes. Denies cough or wheezing.  Denies shortness of breath. Denies chest pain or palpitations. Denies leg swelling. Denies abdominal distention, pain, blood in stools, constipation, diarrhea, nausea, vomiting, or early satiety. Denies pain with intercourse, dysuria, frequency, hematuria or incontinence. Denies hot flashes, pelvic pain, vaginal bleeding or vaginal discharge.   Denies joint pain, back pain or muscle pain/cramps. Denies itching, rash, or wounds. Denies dizziness, headaches, numbness or seizures. Denies swollen lymph nodes or glands, denies easy bruising or bleeding. Denies anxiety, depression, confusion, or decreased concentration.  Physical Exam: BP (!) 148/74   Pulse 63   Temp 98 F (36.7 C) (Oral)   Resp 16   Ht 5' 3.5 (1.613 m)   Wt 193 lb 15.7 oz (88 kg)   SpO2 93%   BMI 33.82 kg/m  General: Alert, oriented, no acute  distress. HEENT: Posterior oropharynx clear, sclera anicteric. Chest: Unlabored breathing on room air.  Laboratory & Radiologic Studies:    Latest Ref Rng & Units 11/10/2023    1:42 PM 10/12/2022    8:42 AM 10/06/2021    9:25 AM  CBC  WBC 4.0 - 10.5 K/uL 7.4  8.2  7.6   Hemoglobin 12.0 - 15.0 g/dL 16.1  09.6  04.5   Hematocrit 36.0 - 46.0 % 40.8  37.8  38.2   Platelets 150 - 400 K/uL 246  256.0  289.0       Latest Ref Rng & Units 11/10/2023    1:42 PM 10/12/2022    8:42 AM 10/06/2021    9:25 AM  BMP  Glucose 70 - 99 mg/dL 85  92  92   BUN 8 - 23 mg/dL 11  9  12    Creatinine 0.44 - 1.00 mg/dL 4.09  8.11  9.14   Sodium 135 - 145 mmol/L 140  142  139   Potassium 3.5 - 5.1 mmol/L 3.6  3.6  3.6   Chloride 98 - 111 mmol/L 103  101  100   CO2 22 - 32 mmol/L 28  31  31    Calcium  8.9 - 10.3 mg/dL 9.5  9.3  9.4    Assessment & Plan: AMEL KITCH is a 73 y.o. woman with a history of recurrent Paget's disease of the vulva. Most recent biopsy in 03/2023 with acute inflammation, no Pagets.  Plan for surgical excision today.  Wiley Hanger, MD  Division of Gynecologic Oncology  Department of Obstetrics and Gynecology  [facility]

## 2023-11-24 NOTE — Transfer of Care (Signed)
 Immediate Anesthesia Transfer of Care Note  Patient: [name]  Procedure(s) Performed: WIDE EXCISION VULVECTOMY  Patient Location: PACU  Anesthesia Type:General  Level of Consciousness: awake  Airway & Oxygen Therapy: Patient Spontanous Breathing  Post-op Assessment: Report given to RN and Post -op Vital signs reviewed and stable  Post vital signs: Reviewed and stable  Last Vitals:  Vitals Value Taken Time  BP 128/72 11/24/23 08:15  Temp    Pulse 76 11/24/23 08:22  Resp 14 11/24/23 08:22  SpO2 93 % 11/24/23 08:22  Vitals shown include unfiled device data.  Last Pain:  Vitals:   11/24/23 0613  TempSrc: Oral  PainSc:          Complications: No notable events documented.

## 2023-11-24 NOTE — Anesthesia Postprocedure Evaluation (Signed)
 Anesthesia Post Note  Patient: [name]  Procedure(s) Performed: WIDE EXCISION VULVECTOMY     Patient location during evaluation: PACU Anesthesia Type: General Level of consciousness: awake and alert, oriented and patient cooperative Pain management: pain level controlled Vital Signs Assessment: post-procedure vital signs reviewed and stable Respiratory status: spontaneous breathing, nonlabored ventilation and respiratory function stable Cardiovascular status: blood pressure returned to baseline and stable Postop Assessment: no apparent nausea or vomiting Anesthetic complications: no   No notable events documented.  Last Vitals:  Vitals:   11/24/23 0845 11/24/23 0915  BP: 136/66 (!) 151/75  Pulse: 74 72  Resp: 16 16  Temp:  (!) 36.2 C  SpO2: 94% 94%    Last Pain:  Vitals:   11/24/23 0915  TempSrc: Oral  PainSc: 0-No pain                 Jacquelyne Matte

## 2023-11-24 NOTE — Op Note (Addendum)
 Operative Report  PATIENT: [name] DATE: 11/24/23  Preop Diagnosis: Recurrent Paget's disease of the vulva  Postoperative Diagnosis: same as above  Surgery: Partial simple left vulvectomy  Surgeons:  Wiley Hanger, MD  Assistant: none  Anesthesia: General   Estimated blood loss: 25 ml  IVF:  see I&O flowsheet   Urine output: n/a   Complications: None apparent  Pathology: Left vulva with marking stitch at 12 o'clock  Operative findings: Healed biopsy site between 2-3 o'clock, minimal eczematoid appearance of the skin measuring 1-1.5 cm.  Procedure: The patient was identified in the preoperative holding area. Informed consent was signed on the chart. Patient was seen history was reviewed and exam was performed.   The patient was then taken to the operating room and placed in the supine position with SCD hose on. General anesthesia was then induced without difficulty. She was then placed in the dorsolithotomy position. The perineum was prepped with Betadine. The vagina was prepped with Betadine. The patient was then draped after the prep was dried.   Timeout was performed the patient, procedure, antibiotic, allergy , and length of procedure. The vulvar tissues were inspected with findings as above. The lesion was identified and the marking pen was used to circumscribe the area with appropriate surgical margins. The subcuticular tissues were infiltrated with 0.25% marcaine . The 15 blade scalpel was used to make an incision through the skin circumferentially as marked. The skin elipse was grasped and was separated from the underlying deep dermal tissues with the bovie device. After the specimen had been completely resected, it was oriented and marked at 12 o'clock with a 0-vicryl suture. The bovie was used to obtain hemostasis at the surgical bed. The subcutaneous tissues were irrigated and made hemostatic.   The deep dermal layer was approximated with 3-0 vicryl  mattress sutures to bring the skin edges into approximation and off tension. The wound was closed following langher's lines. The cutaneous layer was closed with interrupted 4-0 vicryl stitches and mattress sutures to ensure a tension free and hemostatic closure. The perineum was again irrigated.   All instrument, suture, laparotomy, Ray-Tec, and needle counts were correct x2. The patient tolerated the procedure well and was taken recovery room in stable condition.  Wiley Hanger MD Gynecologic Oncology

## 2023-11-25 ENCOUNTER — Ambulatory Visit

## 2023-11-25 ENCOUNTER — Encounter (HOSPITAL_COMMUNITY): Payer: Self-pay | Admitting: Gynecologic Oncology

## 2023-11-25 ENCOUNTER — Telehealth: Payer: Self-pay | Admitting: *Deleted

## 2023-11-25 VITALS — Ht 63.5 in | Wt 193.0 lb

## 2023-11-25 DIAGNOSIS — Z1231 Encounter for screening mammogram for malignant neoplasm of breast: Secondary | ICD-10-CM | POA: Diagnosis not present

## 2023-11-25 DIAGNOSIS — Z0000 Encounter for general adult medical examination without abnormal findings: Secondary | ICD-10-CM

## 2023-11-25 NOTE — Progress Notes (Addendum)
 Please attest and cosign this visit due to patients primary care provider not being in the office at the time the visit was completed.    Subjective:   [name] is a 73 y.o. who presents for a Medicare Wellness preventive visit.  As a reminder, Annual Wellness Visits don't include a physical exam, and some assessments may be limited, especially if this visit is performed virtually. We may recommend an in-person follow-up visit with your provider if needed.  Visit Complete: Virtual I connected with  [name] on 11/25/23 by a audio enabled telemedicine application and verified that I am speaking with the correct person using two identifiers.  Patient Location: Home  Provider Location: Office/Clinic  I discussed the limitations of evaluation and management by telemedicine. The patient expressed understanding and agreed to proceed.  Vital Signs: Because this visit was a virtual/telehealth visit, some criteria may be missing or patient reported. Any vitals not documented were not able to be obtained and vitals that have been documented are patient reported.  VideoDeclined- This patient declined Librarian, academic. Therefore the visit was completed with audio only.  Persons Participating in Visit: Patient.  AWV Questionnaire: No: Patient Medicare AWV questionnaire was not completed prior to this visit.  Cardiac Risk Factors include: advanced age (>82men, >43 women);hypertension;dyslipidemia;obesity (BMI >30kg/m2)     Objective:    Today's Vitals   11/25/23 1352 11/25/23 1400  Weight: 193 lb (87.5 kg)   Height: 5' 3.5 (1.613 m)   PainSc:  5    Body mass index is 33.65 kg/m.     11/24/2023    5:42 AM 11/10/2023    1:21 PM 11/15/2022   11:20 AM 09/23/2022    9:16 AM 06/19/2021   10:31 AM 08/08/2020    3:17 PM 06/18/2020   10:32 AM  Advanced Directives  Does Patient Have a Medical Advance Directive? No No No Yes No No No  Type of Paramedic;Living will     Copy of Healthcare Power of Attorney in Chart?    No - copy requested     Would patient like information on creating a medical advance directive? No - Patient declined No - Patient declined   Yes (MAU/Ambulatory/Procedural Areas - Information given) No - Patient declined No - Patient declined    Current Medications (verified) Outpatient Encounter Medications as of 11/25/2023  Medication Sig   acetaminophen  (TYLENOL ) 500 MG tablet Take 1,000 mg by mouth every 6 (six) hours as needed for moderate pain (pain score 4-6).   albuterol  (VENTOLIN  HFA) 108 (90 Base) MCG/ACT inhaler Inhale 2 puffs into the lungs every 6 (six) hours as needed for wheezing or shortness of breath.   alendronate  (FOSAMAX ) 70 MG tablet TAKE 1 TABLET(70 MG) BY MOUTH EVERY 7 DAYS WITH A FULL GLASS OF WATER AND ON AN EMPTY STOMACH   ALLERGY  RELIEF 180 MG tablet TAKE 1 TABLET BY MOUTH EVERY DAY AS NEEDED FOR ALLERGIES MAY TAKE EXTRA DOSE DURING FLARE UPS   Apoaequorin (PREVAGEN EXTRA STRENGTH) 20 MG CAPS Take 20 mg by mouth daily.   atorvastatin  (LIPITOR) 10 MG tablet TAKE 1 TABLET(10 MG) BY MOUTH DAILY   dicyclomine  (BENTYL ) 10 MG capsule TAKE 1 CAPSULE(10 MG) BY MOUTH THREE TIMES DAILY BETWEEN MEALS AS NEEDED FOR SPASMS (Patient taking differently: Pt takes bid)   famotidine  (PEPCID ) 20 MG tablet TAKE 1 TABLET(20 MG) BY MOUTH TWICE DAILY   lansoprazole  (PREVACID )  15 MG capsule Take 1 capsule (15 mg total) by mouth 2 (two) times daily before a meal.   losartan -hydrochlorothiazide  (HYZAAR ) 100-25 MG tablet TAKE 1 TABLET BY MOUTH EVERY DAY   montelukast  (SINGULAIR ) 10 MG tablet Take 1 tablet (10 mg total) by mouth at bedtime.   senna-docusate (SENOKOT-S) 8.6-50 MG tablet Take 2 tablets by mouth at bedtime. For AFTER surgery, do not take if having diarrhea   traMADol  (ULTRAM ) 50 MG tablet Take 1 tablet (50 mg total) by mouth every 6 (six) hours as needed for severe pain (pain  score 7-10). For AFTER surgery only, do not take and drive   triamcinolone  cream (KENALOG ) 0.1 % Apply 1 Application topically 2 (two) times daily as needed (rash).   fluticasone  (FLONASE ) 50 MCG/ACT nasal spray Place 2 sprays into both nostrils 2 (two) times daily. (Patient not taking: Reported on 11/25/2023)   No facility-administered encounter medications on file as of 11/25/2023.    Allergies (verified) Ibuprofen, Naprosyn [naproxen], Other, Sevoflurane, Azithromycin, and Sulfa antibiotics   History: Past Medical History:  Diagnosis Date   Allergic rhinitis    Arthritis    Asthma    mild no inaler use   GERD (gastroesophageal reflux disease)    Headache(784.0)    Migraines   History of adenomatous polyp of colon    History of concussion    AS CHILD--  NO RESIDUAL   History of palpitations    Hypertension    Paget's disease of vulva (HCC)    PONV (postoperative nausea and vomiting)    SEVERE   Wears glasses    Past Surgical History:  Procedure Laterality Date   mucoid cyst removal thumb     PULLERY RELEASE LEFT THUMB, LEFT RING FINGER/  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  02-17-2011   PULLEY RELEASE RIGHT THUMB  03-23-2011   SIMPLE VULVECTOMY  03-24-2010   right side   TONSILLECTOMY  1977   TRIGGER FINGER RELEASE     VAGINAL HYSTERECTOMY  1989   and Anterior and posterior repair's for prolapse partial   VULVECTOMY Right 01/10/2015   Procedure: RIGHT WIDE LOCAL EXCISION VULVECTOMY;  Surgeon: Valeen Gartner, MD;  Location[facility];  Service: Gynecology;  Laterality: Right;   VULVECTOMY Right 04/10/2015   Procedure: WIDE LOCAL EXCISION VULVA;  Surgeon: Valeen Gartner, MD;  Location: [facility];  Service: Gynecology;  Laterality: Right;   VULVECTOMY N/A 11/24/2023   Procedure: WIDE EXCISION VULVECTOMY;  Surgeon: Suzi Essex, MD;  Location: [facility];  Service: Gynecology;  Laterality: N/A;    VULVECTOMY PARTIAL N/A 11/07/2018   Procedure: VULVECTOMY PARTIAL;  Surgeon: Andra Kava, MD;  Location[facility];  Service: Gynecology;  Laterality: N/A;   WISDOM TOOTH EXTRACTION     Family History  Problem Relation Age of Onset   Cancer Maternal Grandmother    Hypertension Father    Heart disease Father    Asthma Father    Hypertension Mother    Heart disease Mother    Breast cancer Paternal Grandmother 57   Colon cancer Paternal Grandmother    Colon cancer Paternal Uncle    Allergic rhinitis Neg Hx    Angioedema Neg Hx    Eczema Neg Hx    Immunodeficiency Neg Hx    Urticaria Neg Hx    Esophageal cancer Neg Hx    Rectal cancer Neg Hx    Stomach cancer Neg Hx    Social History  Socioeconomic History   Marital status: Married    Spouse name: Not on file   Number of children: 3   Years of education: Not on file   Highest education level: Not on file  Occupational History   Occupation: retired  Tobacco Use   Smoking status: Former    Current packs/day: 0.00    Types: Cigarettes    Start date: 04/15/1984    Quit date: 04/15/1989    Years since quitting: 34.6   Smokeless tobacco: Never  Vaping Use   Vaping status: Never Used  Substance and Sexual Activity   Alcohol use: Not Currently    Alcohol/week: 1.0 standard drink of alcohol    Types: 1 Glasses of wine per week    Comment: occasional wine   Drug use: No   Sexual activity: Not on file  Other Topics Concern   Not on file  Social History Narrative   She is married.  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   Occasional wine, former smoker not now no substances   3 kids plus two step daughters   Social Drivers of Corporate investment banker Strain: Low Risk  (11/25/2023)   Overall Financial Resource Strain (CARDIA)    Difficulty of Paying Living Expenses: Not hard at all  Food Insecurity: No Food Insecurity (11/25/2023)   Hunger Vital Sign    Worried About Running Out of Food in the Last  Year: Never true    Ran Out of Food in the Last Year: Never true  Transportation Needs: No Transportation Needs (11/25/2023)   PRAPARE - Administrator, Civil Service (Medical): No    Lack of Transportation (Non-Medical): No  Physical Activity: Insufficiently Active (11/25/2023)   Exercise Vital Sign    Days of Exercise per Week: 3 days    Minutes of Exercise per Session: 20 min  Stress: No Stress Concern Present (11/25/2023)   [employer] of Occupational Health - Occupational Stress Questionnaire    Feeling of Stress: Not at all  Social Connections: Socially Integrated (11/25/2023)   Social Connection and Isolation Panel    Frequency of Communication with Friends and Family: More than three times a week    Frequency of Social Gatherings with Friends and Family: More than three times a week    Attends Religious Services: More than 4 times per year    Active Member of [employer] or Organizations: Yes    Attends Engineer, structural: More than 4 times per year    Marital Status: Married    Tobacco Counseling Counseling given: Not Answered    Clinical Intake:  Pre-visit preparation completed: Yes  Pain : No/denies pain Pain Score: 5  Pain Location: Knee Pain Orientation: Right     BMI - recorded: 33.65 Nutritional Status: BMI > 30  Obese Nutritional Risks: None Diabetes: No  No results found for: HGBA1C   How often do you need to have someone help you when you read instructions, pamphlets, or other written materials from your doctor or pharmacy?: 1 - Never  Interpreter Needed?: No  Comments: lives with husband Information entered by :: B.Alvetta Hidrogo,LPN   Activities of Daily Living     11/25/2023    2:15 PM 11/10/2023    1:26 PM  In your present state of health, do you have any difficulty performing the following activities:  Hearing? 0   Vision? 0   Difficulty concentrating or making decisions? 1   Walking or climbing stairs? 1  Dressing or  bathing? 0   Doing errands, shopping? 0 0  Preparing Food and eating ? N   Using the Toilet? N   In the past six months, have you accidently leaked urine? N   Do you have problems with loss of bowel control? N   Managing your Medications? N   Managing your Finances? N   Housekeeping or managing your Housekeeping? N     Patient Care Team: Judithann Novas, MD as PCP - [facility], [facility]  I have updated your Care Teams any recent Medical Services you may have received from other providers in the past year.     Assessment:   This is a routine wellness examination for [name].  Hearing/Vision screen Hearing Screening - Comments:: Pt says her hearing is excellent Vision Screening - Comments:: Pt says her vision is good;readers only and glasses for driving [facility]-July appts   Goals Addressed             This Visit's Progress    Patient Stated   Not on track    11/25/23- I will start walking more daily and increase exercise.      Patient Stated   On track    11/25/23- I will maintain and continue medications as prescribed.      Patient Stated   Not on track    Would like to get outside and walk more     Patient Stated   On track    Walk more and lose 10 pounds.       Depression Screen     11/25/2023    2:11 PM 09/23/2022    9:14 AM 08/17/2022    3:16 PM 07/01/2022    4:27 PM 05/21/2022    8:03 AM 10/15/2021    2:09 PM 06/19/2021   10:35 AM  PHQ 2/9 Scores  PHQ - 2 Score 0 0 0 0 0 0 0  PHQ- 9 Score    0       Fall Risk     11/25/2023    2:04 PM 09/23/2022    9:16 AM 08/17/2022    3:16 PM 07/01/2022    4:27 PM 05/21/2022    8:02 AM  Fall Risk   Falls in the past year? 0 0 0 0 0  Number falls in past yr: 0 0 0 0 0  Injury with Fall? 0 0 0 0 0  Risk for fall due to : No Fall Risks No Fall Risks No Fall Risks No Fall Risks No Fall Risks  Follow up Education provided;Falls prevention discussed Falls prevention discussed;Falls evaluation completed  Falls evaluation completed Falls evaluation completed Falls evaluation completed      Data saved with a previous flowsheet row definition    MEDICARE RISK AT HOME:  Medicare Risk at Home Any stairs in or around the home?: No If so, are there any without handrails?: Yes Home free of loose throw rugs in walkways, pet beds, electrical cords, etc?: Yes Adequate lighting in your home to reduce risk of falls?: Yes Life alert?: No Use of a cane, walker or w/c?: Yes (has cane but uses if needed) Grab bars in the bathroom?: Yes Shower chair or bench in shower?: Yes Elevated toilet seat or a handicapped toilet?: No  TIMED UP AND GO:  Was the test performed?  No  Cognitive Function: 6CIT completed    06/18/2020   10:37 AM 03/20/2019   10:00 AM  MMSE -  Mini Mental State Exam  Orientation to time 5 5  Orientation to Place 5 5  Registration 3 3  Attention/ Calculation 5 5  Recall 3 3  Language- repeat 1 1        11/25/2023    2:19 PM 09/23/2022    9:19 AM  6CIT Screen  What Year? 0 points 0 points  What month? 0 points 0 points  What time? 0 points 0 points  Count back from 20 0 points 0 points  Months in reverse 0 points 0 points  Repeat phrase 0 points 0 points  Total Score 0 points 0 points    Immunizations Immunization History  Administered Date(s) Administered   Fluad Quad(high Dose 65+) 04/17/2019, 06/27/2020, 02/18/2022   Fluad Trivalent(High Dose 65+) 03/29/2023   Influenza,inj,Quad PF,6+ Mos 03/13/2015, 04/09/2016, 06/17/2017, 04/07/2018   Pneumococcal Conjugate-13 04/17/2019   Pneumococcal Polysaccharide-23 06/27/2020   Td 12/06/2006   Tdap 05/02/2013   Zoster, Live 04/09/2016    Screening Tests Health Maintenance  Topic Date Due   COVID-19 Vaccine (1) Never done   Zoster Vaccines- Shingrix (1 of 2) 01/13/1972   DTaP/Tdap/Td (3 - Td or Tdap) 05/03/2023   Medicare Annual Wellness (AWV)  09/23/2023   INFLUENZA VACCINE  01/06/2024   MAMMOGRAM  02/29/2024    Colonoscopy  07/30/2025   Pneumococcal Vaccine: 50+ Years  Completed   DEXA SCAN  Completed   Hepatitis C Screening  Completed   HPV VACCINES  Aged Out   Meningococcal B Vaccine  Aged Out    Health Maintenance  Health Maintenance Due  Topic Date Due   COVID-19 Vaccine (1) Never done   Zoster Vaccines- Shingrix (1 of 2) 01/13/1972   DTaP/Tdap/Td (3 - Td or Tdap) 05/03/2023   Medicare Annual Wellness (AWV)  09/23/2023   Health Maintenance Items Addressed: Mammogram ordered  Additional Screening:  Vision Screening: Recommended annual ophthalmology exams for early detection of glaucoma and other disorders of the eye. Would you like a referral to an eye doctor? No    Dental Screening: Recommended annual dental exams for proper oral hygiene  Community Resource Referral / Chronic Care Management: CRR required this visit?  No   CCM required this visit?  Appt scheduled with PCP   Plan:    I have personally reviewed and noted the following in the patient's chart:   Medical and social history Use of alcohol, tobacco or illicit drugs  Current medications and supplements including opioid prescriptions. Patient is currently taking opioid prescriptions. Information provided to patient regarding non-opioid alternatives. Patient advised to discuss non-opioid treatment plan with their provider. Functional ability and status Nutritional status Physical activity Advanced directives List of other physicians Hospitalizations, surgeries, and ER visits in previous 12 months Vitals Screenings to include cognitive, depression, and falls Referrals and appointments  In addition, I have reviewed and discussed with patient certain preventive protocols, quality metrics, and best practice recommendations. A written personalized care plan for preventive services as well as general preventive health recommendations were provided to patient.   Nerissa Bannister, LPN   5/62/1308   After Visit  Summary: (MyChart) Due to this being a telephonic visit, the after visit summary with patients personalized plan was offered to patient via MyChart   Notes: pt with complaints of rt knee pain. Appt made next week with PCP

## 2023-11-25 NOTE — Telephone Encounter (Signed)
 Spoke with Ms. Chervenak this morning. She states she is eating, drinking and urinating well. She has not had a BM yet but is passing gas. She is taking a stool softener as prescribed and encouraged her to drink plenty of water. She denies fever or chills. Incisions are dry and intact. She rates her pain 3/10. Her pain is controlled with tylenol . Pt is also currently experiencing knee pain from an injury and states she will be calling her PCP to be evaluated with a possible referral to  orthopedics.     Instructed to call office with any fever, chills, purulent drainage, uncontrolled pain or any other questions or concerns. Patient verbalizes understanding.   Pt aware of post op appointments as well as the office number [phone] and after hours number [phone] to call if she has any questions or concerns

## 2023-11-25 NOTE — Patient Instructions (Signed)
 [name] , Thank you for taking time out of your busy schedule to complete your Annual Wellness Visit with me. I enjoyed our conversation and look forward to speaking with you again next year. I, as well as your care team,  appreciate your ongoing commitment to your health goals. Please review the following plan we discussed and let me know if I can assist you in the future. Your Game plan/ To Do List    Referrals: If you haven't heard from the office you've been referred to, please reach out to them at the phone provided.  You have an order for:  []   2D Mammogram  [x]   3D Mammogram  []   Bone Density     Please call for appointment:  [facility]  [address] [place] [address] [phone]  [facility] [address] [place], [place] [address] [phone]  [facility] [address] [place], [place] [address] [phone]    Make sure to wear two-piece clothing.  No lotions, powders, or deodorants the day of the appointment. Make sure to bring picture ID and insurance card.  Bring list of medications you are currently taking including any supplements.   Follow up Visits: Next Medicare AWV with our clinical staff: 11/28/24 @ 1:40pm televisit   Have you seen your provider in the last 6 months (3 months if uncontrolled diabetes)? No Next Office Visit with your provider: 11/30/23  Clinician Recommendations:  Aim for 30 minutes of exercise or brisk walking, 6-8 glasses of water, and 5 servings of fruits and vegetables each day.       This is a list of the screening recommended for you and due dates:  Health Maintenance  Topic Date Due   COVID-19 Vaccine (1) Never done   Zoster (Shingles) Vaccine (1 of 2) 01/13/1972   DTaP/Tdap/Td vaccine (3 - Td or Tdap) 05/03/2023   Medicare Annual Wellness Visit  09/23/2023   Flu Shot  01/06/2024    Mammogram  02/29/2024   Colon Cancer Screening  07/30/2025   Pneumococcal Vaccine for age over 85  Completed   DEXA scan (bone density measurement)  Completed   Hepatitis C Screening  Completed   HPV Vaccine  Aged Out   Meningitis B Vaccine  Aged Out    Advanced directives: (Declined) Advance directive discussed with you today. Even though you declined this today, please call our office should you change your mind, and we can give you the proper paperwork for you to fill out. Advance Care Planning is important because it:  [x]  Makes sure you receive the medical care that is consistent with your values, goals, and preferences  [x]  It provides guidance to your family and loved ones and reduces their decisional burden about whether or not they are making the right decisions based on your wishes.  Follow the link provided in your after visit summary or read over the paperwork we have mailed to you to help you started getting your Advance Directives in place. If you need assistance in completing these, please reach out to us  so that we can help you!

## 2023-11-28 ENCOUNTER — Ambulatory Visit: Payer: Self-pay | Admitting: Gynecologic Oncology

## 2023-11-28 LAB — SURGICAL PATHOLOGY

## 2023-11-30 ENCOUNTER — Encounter: Payer: Self-pay | Admitting: Family Medicine

## 2023-11-30 ENCOUNTER — Ambulatory Visit (INDEPENDENT_AMBULATORY_CARE_PROVIDER_SITE_OTHER)
Admission: RE | Admit: 2023-11-30 | Discharge: 2023-11-30 | Disposition: A | Discharge status: Home / Self Care | Source: Ambulatory Visit | Attending: Family Medicine | Admitting: Family Medicine

## 2023-11-30 ENCOUNTER — Ambulatory Visit (INDEPENDENT_AMBULATORY_CARE_PROVIDER_SITE_OTHER): Admitting: Family Medicine

## 2023-11-30 VITALS — BP 132/72 | HR 57 | Temp 98.2°F | Ht 63.5 in | Wt 195.0 lb

## 2023-11-30 DIAGNOSIS — M25561 Pain in right knee: Secondary | ICD-10-CM | POA: Diagnosis not present

## 2023-11-30 DIAGNOSIS — M25461 Effusion, right knee: Secondary | ICD-10-CM | POA: Diagnosis not present

## 2023-11-30 DIAGNOSIS — M1711 Unilateral primary osteoarthritis, right knee: Secondary | ICD-10-CM | POA: Diagnosis not present

## 2023-11-30 NOTE — Assessment & Plan Note (Signed)
 Acute, no known injury but likely OA with current flare, possible meniscal tear causing severe pain in airport when running.  Also tender of medial collateral ligament, but ligament intact on exam.  NSAIDs contraindicated... Will treat with topical diclofenac gel 4 times daily.  Can use Tylenol  for pain. Check x-ray to evaluate for osteoarthritis.  Nonurgent. If pain not improving will consider referral for steroid injection.

## 2023-11-30 NOTE — Progress Notes (Signed)
 Patient ID: [name], female    DOB: 13-Sep-1950, 73 y.o.   MRN: [id number]  This visit was conducted in person.  BP 132/72   Pulse (!) 57   Temp 98.2 F (36.8 C) (Oral)   Ht 5' 3.5 (1.613 m)   Wt 195 lb (88.5 kg)   SpO2 97%   BMI 34.00 kg/m    CC:  Chief Complaint  Patient presents with   Knee Pain    Right. Ongoing for about a week. She was going through the airport and it felt like a stabbing pain    Subjective:   HPI: [name] is a 72 y.o. female presenting on 11/30/2023 for  knee pain   She reports new onset pain in right knee   daily for 6 months. Anterior . No known fall.  Now in last week... She has been traveling recently, walking more on a cruise... noted sudden a stabbing pain while running in airport.  She has been using tramadol  for pain. Using tylenol . As well.  No clicking or popping.  No redness.   No history of knee issues, no past knee surgery.   NSAIDs make her BP increase.     Relevant past medical, surgical, family and social history reviewed and updated as indicated. Interim medical history since our last visit reviewed. Allergies and medications reviewed and updated. Outpatient Medications Prior to Visit  Medication Sig Dispense Refill   acetaminophen  (TYLENOL ) 500 MG tablet Take 1,000 mg by mouth every 6 (six) hours as needed for moderate pain (pain score 4-6).     albuterol  (VENTOLIN  HFA) 108 (90 Base) MCG/ACT inhaler Inhale 2 puffs into the lungs every 6 (six) hours as needed for wheezing or shortness of breath. 18 g 1   alendronate  (FOSAMAX ) 70 MG tablet TAKE 1 TABLET(70 MG) BY MOUTH EVERY 7 DAYS WITH A FULL GLASS OF WATER AND ON AN EMPTY STOMACH 4 tablet 11   ALLERGY  RELIEF 180 MG tablet TAKE 1 TABLET BY MOUTH EVERY DAY AS NEEDED FOR ALLERGIES MAY TAKE EXTRA DOSE DURING FLARE UPS 180 tablet 1   Apoaequorin (PREVAGEN EXTRA STRENGTH) 20 MG CAPS Take 20 mg by mouth daily.     atorvastatin  (LIPITOR) 10 MG tablet TAKE 1 TABLET(10 MG)  BY MOUTH DAILY 90 tablet 3   dicyclomine  (BENTYL ) 10 MG capsule TAKE 1 CAPSULE(10 MG) BY MOUTH THREE TIMES DAILY BETWEEN MEALS AS NEEDED FOR SPASMS (Patient taking differently: Pt takes bid) 270 capsule 3   famotidine  (PEPCID ) 20 MG tablet TAKE 1 TABLET(20 MG) BY MOUTH TWICE DAILY 180 tablet 1   fluticasone  (FLONASE ) 50 MCG/ACT nasal spray Place 2 sprays into both nostrils 2 (two) times daily.     lansoprazole  (PREVACID ) 15 MG capsule Take 1 capsule (15 mg total) by mouth 2 (two) times daily before a meal. 180 capsule 1   losartan -hydrochlorothiazide  (HYZAAR ) 100-25 MG tablet TAKE 1 TABLET BY MOUTH EVERY DAY 90 tablet 1   montelukast  (SINGULAIR ) 10 MG tablet Take 1 tablet (10 mg total) by mouth at bedtime. 90 tablet 3   senna-docusate (SENOKOT-S) 8.6-50 MG tablet Take 2 tablets by mouth at bedtime. For AFTER surgery, do not take if having diarrhea 30 tablet 0   traMADol  (ULTRAM ) 50 MG tablet Take 1 tablet (50 mg total) by mouth every 6 (six) hours as needed for severe pain (pain score 7-10). For AFTER surgery only, do not take and drive 10 tablet 0   triamcinolone  cream (KENALOG ) 0.1 %  Apply 1 Application topically 2 (two) times daily as needed (rash).     No facility-administered medications prior to visit.     Per HPI unless specifically indicated in ROS section below Review of Systems  Constitutional:  Negative for fatigue and fever.  HENT:  Negative for congestion.   Eyes:  Negative for pain.  Respiratory:  Negative for cough and shortness of breath.   Cardiovascular:  Negative for chest pain, palpitations and leg swelling.  Gastrointestinal:  Negative for abdominal pain.  Genitourinary:  Negative for dysuria and vaginal bleeding.  Musculoskeletal:  Negative for back pain.  Neurological:  Negative for syncope, light-headedness and headaches.  Psychiatric/Behavioral:  Negative for dysphoric mood.    Objective:  BP 132/72   Pulse (!) 57   Temp 98.2 F (36.8 C) (Oral)   Ht 5' 3.5  (1.613 m)   Wt 195 lb (88.5 kg)   SpO2 97%   BMI 34.00 kg/m   Wt Readings from Last 3 Encounters:  11/30/23 195 lb (88.5 kg)  11/25/23 193 lb (87.5 kg)  11/24/23 193 lb 15.7 oz (88 kg)      Physical Exam Constitutional:      General: She is not in acute distress.    Appearance: Normal appearance. She is well-developed. She is not ill-appearing or toxic-appearing.  HENT:     Head: Normocephalic.     Right Ear: Hearing, tympanic membrane, ear canal and external ear normal. Tympanic membrane is not erythematous, retracted or bulging.     Left Ear: Hearing, tympanic membrane, ear canal and external ear normal. Tympanic membrane is not erythematous, retracted or bulging.     Nose: No mucosal edema or rhinorrhea.     Right Sinus: No maxillary sinus tenderness or frontal sinus tenderness.     Left Sinus: No maxillary sinus tenderness or frontal sinus tenderness.     Mouth/Throat:     Pharynx: Uvula midline.   Eyes:     General: Lids are normal. Lids are everted, no foreign bodies appreciated.     Conjunctiva/sclera: Conjunctivae normal.     Pupils: Pupils are equal, round, and reactive to light.   Neck:     Thyroid : No thyroid  mass or thyromegaly.     Vascular: No carotid bruit.     Trachea: Trachea normal.   Cardiovascular:     Rate and Rhythm: Normal rate and regular rhythm.     Pulses: Normal pulses.     Heart sounds: Normal heart sounds, S1 normal and S2 normal. No murmur heard.    No friction rub. No gallop.  Pulmonary:     Effort: Pulmonary effort is normal. No tachypnea or respiratory distress.     Breath sounds: Normal breath sounds. No decreased breath sounds, wheezing, rhonchi or rales.  Abdominal:     General: Bowel sounds are normal.     Palpations: Abdomen is soft.     Tenderness: There is no abdominal tenderness.   Musculoskeletal:     Cervical back: Normal range of motion and neck supple.     Right knee: Swelling and deformity present. No erythema, bony  tenderness or crepitus. Decreased range of motion. Tenderness present over the medial joint line, lateral joint line and MCL. No LCL, ACL or PCL tenderness. Normal alignment, normal meniscus and normal patellar mobility.     Instability Tests: Anterior drawer test negative. Posterior drawer test negative. Anterior Lachman test negative. Medial McMurray test negative and lateral McMurray test negative.   Skin:  General: Skin is warm and dry.     Findings: No rash.   Neurological:     Mental Status: She is alert.   Psychiatric:        Mood and Affect: Mood is not anxious or depressed.        Speech: Speech normal.        Behavior: Behavior normal. Behavior is cooperative.        Thought Content: Thought content normal.        Judgment: Judgment normal.       Results for orders placed or performed during the hospital encounter of 11/24/23  Surgical pathology   Collection Time: 11/24/23  7:54 AM  Result Value Ref Range   SURGICAL PATHOLOGY      SURGICAL PATHOLOGY CASE: [id number] PATIENT: ARLAND KNEE Surgical Pathology Report     Clinical History: Paget's disease of vulva (las)     FINAL MICROSCOPIC DIAGNOSIS:  A. VULVA, LEFT, EXCISION: - Pagetss disease - Margins negative for involvement  GROSS DESCRIPTION:  Specimen: Tan-pink to light brown skin ellipse without fat, clinically from left vulva, received fresh. Size: 4 cm from 12-6 o'clock, up to 2.5 cm from 3-9 o'clock and up to 0.25 cm thick. Lesion: There is a 1.1 x 0.2 x 0.1 cm fold of skin but no distinct lesions. Orientation: The specimen is inked green from 12-5 o'clock, orange from 6-11 o'clock, deep margin black. Block Summary: Blocks 1-4 = 8 cross-sections to include 3 o'clock, deep and 9 o'clock margins, sequentially submitted from 12-6 o'clock. Block 5 = radial sections of 12 and 6 o'clock poles  SW 11/25/2023    Final Diagnosis performed by Ilsa Pottier, MD.   Electronically  signed 11/28/2023 Technical and / or Profession al components performed at [facility], [address], [place], [place] [address].  Immunohistochemistry Technical component (if applicable) was performed at [facility]. [address], [address], [place], [place] [address].   IMMUNOHISTOCHEMISTRY DISCLAIMER (if applicable): Some of these immunohistochemical stains may have been developed and the performance characteristics determine by [facility]. Some may not have been cleared or approved by the U.S. Food and Drug Administration. The FDA has determined that such clearance or approval is not necessary. This test is used for clinical purposes. It should not be regarded as investigational or for research. This laboratory is certified under the Clinical Laboratory Improvement Amendments of 1988 (CLIA-88) as qualified to perform high complexity clinical laboratory testing.  The controls stained appropriately.   IHC stains are performed on formalin fixed, paraffin embedded  tissue using a 3,3diaminobenzidine (DAB) chromogen and Leica Bond Autostainer System. The staining intensity of the nucleus is score manually and is reported as the percentage of tumor cell nuclei demonstrating specific nuclear staining. The specimens are fixed in 10% Neutral Formalin for at least 6 hours and up to 72hrs. These tests are validated on decalcified tissue. Results should be interpreted with caution given the possibility of false negative results on decalcified specimens. Antibody Clones are as follows ER-clone 69F, PR-clone 16, Ki67- clone MM1. Some of these immunohistochemical stains may have been developed and the performance characteristics determined by [facility].     Assessment and Plan  Acute pain of right knee Assessment & Plan:  Acute, no known injury but likely OA with current flare, possible meniscal tear [place] in  [place].  Also tender of medial collateral ligament, but ligament intact on exam.  NSAIDs contraindicated... Will  treat with topical diclofenac gel 4 times daily.  Can use Tylenol  for pain. Check x-ray to evaluate for osteoarthritis.  Nonurgent. If pain not improving will consider referral for steroid injection.  Orders: -     DG Knee Complete 4 Views Right; Future    No follow-ups on file.   Greig Ring, MD

## 2023-12-05 ENCOUNTER — Telehealth: Payer: Self-pay | Admitting: *Deleted

## 2023-12-05 DIAGNOSIS — G8929 Other chronic pain: Secondary | ICD-10-CM | POA: Diagnosis not present

## 2023-12-05 NOTE — Telephone Encounter (Signed)
 Spoke with [name] to let her know her x-ray results are not back yet.  I let her know about the Radiologist shortage and advised we will be in  touch as soon as we get these results back.

## 2023-12-05 NOTE — Telephone Encounter (Signed)
 Copied from CRM [id number]. Topic: Clinical - Lab/Test Results >> Dec 05, 2023  9:41 AM Mercedes MATSU wrote: Reason for CRM: Patient called in to go over her x-ray report with Dr. Avelina. Is requesting a call back at [phone].

## 2023-12-07 DIAGNOSIS — K080 Exfoliation of teeth due to systemic causes: Secondary | ICD-10-CM | POA: Diagnosis not present

## 2023-12-08 ENCOUNTER — Ambulatory Visit: Payer: Self-pay | Admitting: Family Medicine

## 2023-12-08 ENCOUNTER — Ambulatory Visit: Payer: Self-pay

## 2023-12-08 NOTE — Telephone Encounter (Signed)
 Appointment scheduled with Dr. Watt 12/14/2023 at 2:40 pm.

## 2023-12-08 NOTE — Telephone Encounter (Signed)
 FYI Only or Action Required?: Action required by provider: referral request.  Patient was last seen in primary care on 11/30/2023 by Avelina Greig BRAVO, MD. Called Nurse Triage reporting Knee Pain. Symptoms began several days ago. Interventions attempted: Prescription medications: see note . Symptoms are: unchanged.  Triage Disposition: See PCP Within 2 Weeks Requesting Referral   Patient/caregiver understands and will follow disposition?: Yes     Copied from CRM [id number]. Topic: Clinical - Red Word Triage >> Dec 08, 2023 12:30 PM [name] wrote: Red Word that prompted transfer to Nurse Triage: Knee Pain Reason for Disposition  Knee pain is a chronic symptom (recurrent or ongoing AND present > 4 weeks)  Answer Assessment - Initial Assessment Questions 1. LOCATION and RADIATION: Where is the pain located?      ----------R. Knee     2. QUALITY: What does the pain feel like?  (e.g., sharp, dull, aching, burning)     ------------------- Sharp    3. SEVERITY: How bad is the pain? What does it keep you from doing?   (Scale 1-10; or mild, moderate, severe)   -  MILD (1-3): doesn't interfere with normal activities    -  MODERATE (4-7): interferes with normal activities (e.g., work or school) or awakens from sleep, limping    -  SEVERE (8-10): excruciating pain, unable to do any normal activities, unable to walk      -----------------Moderate-- only when laying down   4. ONSET: When did the pain start? Does it come and go, or is it there all the time?    ---------------Few days ago        ------ 7. AGGRAVATING FACTORS: What makes the knee pain worse? (e.g., walking, climbing stairs, running)     ------------- Laying down/ Bending the knee   8. ASSOCIATED SYMPTOMS: Is there any swelling or redness of the knee?     -------------------------------Denies   9. OTHER SYMPTOMS: Do you have any other symptoms? (e.g., chest pain, difficulty breathing, fever, calf pain)      -------Denies      Additional Inform:  Was seen by PCP 12/05/23 for knee pain-- Xray revealed OA. PCP recommended topical diclofenac gel 4 times daily.  And Tylenol  for pain. PCP noted,if pain not improving will consider referral for steroid injection.   Patient reports since she is only getting mild relief form Voltaren and Tylenol , she is willing to try the Steroid Injections   Message routed to office for the referral.   Patient educated on pertinent s/s that would warrant emergent help/call 911/ ED/UC.  Patient verbalized understanding and agrees with plan No additional questions/concerns noted during the time of the call.  Protocols used: Knee Pain-A-AH

## 2023-12-13 ENCOUNTER — Other Ambulatory Visit: Payer: Self-pay | Admitting: Allergy

## 2023-12-13 ENCOUNTER — Other Ambulatory Visit: Payer: Self-pay | Admitting: Family Medicine

## 2023-12-13 NOTE — Progress Notes (Unsigned)
     Amarri Satterly T. Savilla Turbyfill, MD, CAQ Sports Medicine [facility] [address] [place], [address]  Phone: [phone]  FAX: [phone]  [name] - 73 y.o. female  MRN [id number]  Date of Birth: March 09, 1951  Date: 12/14/2023  PCP: Avelina Greig FORBES, MD  Referral: Avelina Greig FORBES, MD  No chief complaint on file.  Subjective:   [name] is a 73 y.o. very pleasant female patient with There is no height or weight on file to calculate BMI. who presents with the following:  The patient presents with ongoing knee pain.  She recently saw Dr. Avelina with some acute right knee pain.  Nonweightbearing trauma series of the right knee from December 07, 2023 is independently reviewed by myself.  Patient has some mild arthritic changes only.    Review of Systems is noted in the HPI, as appropriate  Objective:   There were no vitals taken for this visit.  GEN: No acute distress; alert,appropriate. PULM: Breathing comfortably in no respiratory distress PSYCH: Normally interactive.   Laboratory and Imaging Data:  Assessment and Plan:   ***

## 2023-12-14 ENCOUNTER — Ambulatory Visit (INDEPENDENT_AMBULATORY_CARE_PROVIDER_SITE_OTHER): Admitting: Family Medicine

## 2023-12-14 ENCOUNTER — Encounter: Payer: Self-pay | Admitting: Family Medicine

## 2023-12-14 VITALS — BP 120/70 | HR 60 | Temp 97.1°F | Ht 63.5 in | Wt 198.4 lb

## 2023-12-14 DIAGNOSIS — M1711 Unilateral primary osteoarthritis, right knee: Secondary | ICD-10-CM

## 2023-12-14 DIAGNOSIS — M25561 Pain in right knee: Secondary | ICD-10-CM | POA: Diagnosis not present

## 2023-12-14 MED ORDER — TRIAMCINOLONE ACETONIDE 40 MG/ML IJ SUSP
40.0000 mg | Freq: Once | INTRAMUSCULAR | Status: AC
  Administered 2023-12-14: 40 mg via INTRA_ARTICULAR

## 2023-12-15 ENCOUNTER — Encounter: Payer: Self-pay | Admitting: Allergy

## 2023-12-15 ENCOUNTER — Other Ambulatory Visit: Payer: Self-pay

## 2023-12-15 ENCOUNTER — Ambulatory Visit: Admitting: Allergy

## 2023-12-15 VITALS — BP 118/72 | HR 67 | Temp 98.3°F | Resp 18 | Ht 63.78 in | Wt 197.6 lb

## 2023-12-15 DIAGNOSIS — K2100 Gastro-esophageal reflux disease with esophagitis, without bleeding: Secondary | ICD-10-CM

## 2023-12-15 DIAGNOSIS — J3089 Other allergic rhinitis: Secondary | ICD-10-CM

## 2023-12-15 DIAGNOSIS — J4540 Moderate persistent asthma, uncomplicated: Secondary | ICD-10-CM | POA: Diagnosis not present

## 2023-12-15 DIAGNOSIS — J302 Other seasonal allergic rhinitis: Secondary | ICD-10-CM

## 2023-12-15 MED ORDER — FAMOTIDINE 20 MG PO TABS: 20.0000 mg | ORAL_TABLET | Freq: Two times a day (BID) | ORAL | 1 refills | Status: DC

## 2023-12-15 MED ORDER — ALBUTEROL SULFATE HFA 108 (90 BASE) MCG/ACT IN AERS: 2.0000 | INHALATION_SPRAY | Freq: Four times a day (QID) | RESPIRATORY_TRACT | 1 refills | Status: DC | PRN

## 2023-12-15 MED ORDER — FLUTICASONE PROPIONATE HFA 110 MCG/ACT IN AERO: INHALATION_SPRAY | RESPIRATORY_TRACT | 5 refills | Status: DC

## 2023-12-15 MED ORDER — LANSOPRAZOLE 15 MG PO CPDR: 15.0000 mg | DELAYED_RELEASE_CAPSULE | Freq: Two times a day (BID) | ORAL | 1 refills | Status: DC

## 2023-12-15 MED ORDER — MONTELUKAST SODIUM 10 MG PO TABS
10.0000 mg | ORAL_TABLET | Freq: Every day | ORAL | 3 refills | Status: AC
Start: 2023-12-15 — End: ?

## 2023-12-15 NOTE — Patient Instructions (Addendum)
 Asthma - lung function testing looks great today. - Daily controller medication(s):  Flovent  110mcg 2 puffs once daily in evening/bedtime with spacer.   Montelukast  10 mg daily at bedtime.   - Prior to physical activity: albuterol  2 puffs 10-15 minutes before physical activity. - Rescue medications: albuterol  4 puffs every 4-6 hours as needed - Changes during respiratory infections or worsening symptoms: Increase Flovent  to 2 puffs twice daily for TWO WEEKS. - Asthma control goals:  * Full participation in all desired activities (may need albuterol  before activity) * Albuterol  use two time or less a week on average (not counting use with activity) * Cough interfering with sleep two time or less a month * Oral steroids no more than once a year * No hospitalizations  Allergic rhinitis Continue allergen avoidance measures directed toward pollens, cat, and mold  Continue Allegra  180 mg 1 tab 1-2 times a day for allergy  symptom control Use Ipratropium bromide  nasal spray using 1-2 sprays in each nostril 3-4 times a day as needed for drainage down throat/runny nose or congestion. Caution as this can be drying Consider saline nasal rinses as needed for nasal symptoms. Use this before any medicated nasal sprays for best result  Reflux Continue dietary and lifestyle modifications as listed below Continue lansoprazole  15 mg twice a day and famotidine  20 mg twice a day  Follow up in 6 months or sooner if needed.

## 2023-12-15 NOTE — Progress Notes (Signed)
 Follow-up Note  RE: [name] MRN: [id number] DOB: 06-Jun-1951 Date of Office Visit: 12/15/2023   History of present illness: [name] is a 73 y.o. female presenting today for follow-up of asthma, allergic rhinitis, reflux.  She was last seen in the office on 12/16/22 by myself.   Discussed the use of AI scribe software for clinical note transcription with the patient, who gave verbal consent to proceed.  The patient has undergone five vulvectomies related to Paget's disease, with the most recent procedure last month. The scars from the latest surgery are larger, and the area remains sore. She tolerates anesthesia well, except for sevoflurane, which previously caused prolonged vomiting.  She experiences wheezing, particularly at night, and has a persistent cough. Recently, she had bronchitis, initially with a productive cough that has since become dry. She has not been using her Flovent  inhaler regularly, even during wheezing episodes, but did use it during the bronchitis episode. She has both Flovent  and albuterol  inhalers at home.  She is currently using Allegra  for allergies and has noticed a persistent cough despite this. She is allergic to ragweed and has noticed increased pollen exposure this year. She uses fluticasone  as a nasal spray (Flonase ) and has ipratropium nasal spray (Atrovent ) at home, which she has not used recently due to previous hoarseness.  She is on lansoprazole  and famotidine  for reflux and requires refills for these medications. She has a history of allergies, including to cats, but manages this by keeping her cats out of the bedroom.     Review of systems: 10pt ROS negative unless noted above in HPI   Past medical/social/surgical/family history have been reviewed and are unchanged unless specifically indicated below.  No changes  Medication List: Current Outpatient Medications  Medication Sig Dispense Refill   acetaminophen  (TYLENOL ) 500 MG tablet  Take 1,000 mg by mouth every 6 (six) hours as needed for moderate pain (pain score 4-6).     alendronate  (FOSAMAX ) 70 MG tablet TAKE 1 TABLET(70 MG) BY MOUTH EVERY 7 DAYS WITH A FULL GLASS OF WATER AND ON AN EMPTY STOMACH 4 tablet 11   ALLERGY  RELIEF 180 MG tablet TAKE 1 TABLET BY MOUTH EVERY DAY AS NEEDED FOR ALLERGIES MAY TAKE EXTRA DOSE DURING FLARE UPS 180 tablet 1   Apoaequorin (PREVAGEN EXTRA STRENGTH) 20 MG CAPS Take 20 mg by mouth daily.     atorvastatin  (LIPITOR) 10 MG tablet TAKE 1 TABLET(10 MG) BY MOUTH DAILY 90 tablet 3   dicyclomine  (BENTYL ) 10 MG capsule Take 10 mg by mouth 2 (two) times daily as needed for spasms.     fluticasone  (FLONASE ) 50 MCG/ACT nasal spray Place 2 sprays into both nostrils 2 (two) times daily.     fluticasone  (FLOVENT  HFA) 110 MCG/ACT inhaler Flovent  110mcg 2 puffs once daily in evening/bedtime with spacer. 1 each 5   losartan -hydrochlorothiazide  (HYZAAR ) 100-25 MG tablet TAKE 1 TABLET BY MOUTH EVERY DAY 90 tablet 1   senna-docusate (SENOKOT-S) 8.6-50 MG tablet Take 2 tablets by mouth at bedtime. For AFTER surgery, do not take if having diarrhea 30 tablet 0   triamcinolone  cream (KENALOG ) 0.1 % Apply 1 Application topically 2 (two) times daily as needed (rash).     albuterol  (VENTOLIN  HFA) 108 (90 Base) MCG/ACT inhaler Inhale 2 puffs into the lungs every 6 (six) hours as needed for wheezing or shortness of breath. 18 g 1   famotidine  (PEPCID ) 20 MG tablet Take 1 tablet (20 mg total) by mouth 2 (  two) times daily. 180 tablet 1   lansoprazole  (PREVACID ) 15 MG capsule Take 1 capsule (15 mg total) by mouth 2 (two) times daily before a meal. 180 capsule 1   montelukast  (SINGULAIR ) 10 MG tablet Take 1 tablet (10 mg total) by mouth at bedtime. 90 tablet 3   No current facility-administered medications for this visit.     Known medication allergies: Allergies  Allergen Reactions   Ibuprofen Hypertension   Naprosyn [Naproxen] Nausea Only   Other     Allergic to  toothpaste- makes mouth peel Hay fever/dust mites/trees 365 allergic    Sevoflurane Nausea And Vomiting   Azithromycin Itching and Rash   Sulfa Antibiotics Hives and Rash     Physical examination: Blood pressure 118/72, pulse 67, temperature 98.3 F (36.8 C), temperature source Temporal, resp. rate 18, height 5' 3.78 (1.62 m), weight 197 lb 9.6 oz (89.6 kg), SpO2 95%.  General: Alert, interactive, in no acute distress. HEENT: PERRLA, TMs pearly gray, turbinates non-edematous without discharge, post-pharynx non erythematous. Neck: Supple without lymphadenopathy. Lungs: Clear to auscultation without wheezing, rhonchi or rales. {no increased work of breathing. CV: Normal S1, S2 without murmurs. Abdomen: Nondistended, nontender. Skin: Warm and dry, without lesions or rashes. Extremities:  No clubbing, cyanosis or edema. Neuro:   Grossly intact.  Diagnostics/Labs:  Spirometry: FEV1: 1.96L 93%, FVC: 2.5L 92%, ratio consistent with nonobstructive pattern  Assessment and plan: Asthma - lung function testing looks great today. - Daily controller medication(s):  Flovent  110mcg 2 puffs once daily in evening/bedtime with spacer.   Montelukast  10 mg daily at bedtime.   - Prior to physical activity: albuterol  2 puffs 10-15 minutes before physical activity. - Rescue medications: albuterol  4 puffs every 4-6 hours as needed - Changes during respiratory infections or worsening symptoms: Increase Flovent  to 2 puffs twice daily for TWO WEEKS. - Asthma control goals:  * Full participation in all desired activities (may need albuterol  before activity) * Albuterol  use two time or less a week on average (not counting use with activity) * Cough interfering with sleep two time or less a month * Oral steroids no more than once a year * No hospitalizations  Allergic rhinitis Continue allergen avoidance measures directed toward pollens, cat, and mold  Continue Allegra  180 mg 1 tab 1-2 times a day  for allergy  symptom control Use Ipratropium bromide  nasal spray using 1-2 sprays in each nostril 3-4 times a day as needed for drainage down throat/runny nose or congestion. Caution as this can be drying Consider saline nasal rinses as needed for nasal symptoms. Use this before any medicated nasal sprays for best result  Reflux Continue dietary and lifestyle modifications as listed below Continue lansoprazole  15 mg twice a day and famotidine  20 mg twice a day  Follow up in 6 months or sooner if needed.  I appreciate the opportunity to take part in Moriya's care. Please do not hesitate to contact me with questions.  Sincerely,   Danita Brain, MD Allergy /Immunology [facility]

## 2023-12-27 ENCOUNTER — Encounter: Admitting: Family Medicine

## 2023-12-29 ENCOUNTER — Encounter: Payer: Self-pay | Admitting: Gynecologic Oncology

## 2023-12-29 ENCOUNTER — Inpatient Hospital Stay: Attending: Gynecologic Oncology | Admitting: Gynecologic Oncology

## 2023-12-29 VITALS — BP 140/83 | HR 67 | Temp 98.1°F | Resp 20 | Wt 194.4 lb

## 2023-12-29 DIAGNOSIS — Z9079 Acquired absence of other genital organ(s): Secondary | ICD-10-CM

## 2023-12-29 DIAGNOSIS — C519 Malignant neoplasm of vulva, unspecified: Secondary | ICD-10-CM

## 2023-12-29 NOTE — Progress Notes (Signed)
 Gynecologic Oncology Return Clinic Visit  12/29/23  Reason for Visit: follow-up   Treatment History: The patient previously had a supracervical hysterectomy but has her ovaries and cervix in situ. Surgery for Paget's disease of the vulva in October 2011.  There was focal involvement of the medial margin.  The entire lesion is approximately 5 cm in diameter.  She then underwent wide local excision of a recurrent Paget's lesion in August 2016.  She had surgical margins that were focally positive and subsequently underwent another excision on 04/10/2015 with negative margins. She had a vulvar biopsy from the right in November 2017 that was negative.   She was seen for a visible lesion and vulvar symptoms in April 2020 and a biopsy of this lesion was consistent with Paget's disease. On 11/07/2018, she underwent partial left vulvectomy with findings of a 1 cm white hyperkeratotic lesion on the left labia minora close to the clitoris.  Margins were positive.  Plan was for consideration of Aldara  since repeat excision could involve removal of the clitoris. Patient was seen on 01/02/2019 with a slightly erythematous area on the right vulva.  Plan was for repeat examination in September with biopsy if persistence of the lesion. On 9/20, patient underwent right vulvar biopsy which revealed extramammary Paget's disease.  After discussion of treatment options, the patient opted for Aldara  use. She was seen in early December, at which time her right vulvar lesion had completely resolved.  At this visit, a small right periclitoral lesion was noted. Given her excellent response to the previous lesion, her decision was to proceed with Aldara  use for this new lesion. Saw me on 07/23/19 with a right peri-clitoral lesion suspected to be side effect from Aldara  cream use. Seen on 02/29/20 with no evidence of disease on exam after 3 months not using Aldara .  Seen in 08/2020, asymptomatic, off treatment. Seen on 02/13/2021:  asymptomatic with no concerning exam findings. Seen in 11/2022: new vulvar lesions, biopsy showed Paget's disease. Started treatment with Aldara  in two locations.  11/24/23: Partial simple left vulvectomy   Interval History: Doing well.  Has very light pink sometimes when she wipes.  Denies any bleeding or discharge otherwise.  Endorses normal bowel and bladder function.  Past Medical/Surgical History: 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  02-17-2011   PULLEY RELEASE RIGHT THUMB  03-23-2011   SIMPLE VULVECTOMY  03-24-2010   right side   TONSILLECTOMY  1977   TRIGGER FINGER RELEASE     VAGINAL HYSTERECTOMY  1989   and Anterior and posterior repair's for prolapse partial   VULVECTOMY Right 01/10/2015   Procedure: RIGHT WIDE LOCAL EXCISION VULVECTOMY;  Surgeon: Toribio Percy, MD;  Location: [facility];  Service: Gynecology;  Laterality: Right;   VULVECTOMY Right  04/10/2015   Procedure: WIDE LOCAL EXCISION VULVA;  Surgeon: Toribio Percy, MD;  Location: [facility];  Service: Gynecology;  Laterality: Right;   VULVECTOMY N/A 11/24/2023   Procedure: WIDE EXCISION VULVECTOMY;  Surgeon: Viktoria Comer SAUNDERS, MD;  Location: [facility];  Service: Gynecology;  Laterality: N/A;   VULVECTOMY PARTIAL N/A 11/07/2018   Procedure: VULVECTOMY PARTIAL;  Surgeon: Dodie Shadow, MD;  Location:  [facility];  Service: Gynecology;  Laterality: N/A;   WISDOM TOOTH EXTRACTION      Family History  Problem Relation Age of Onset   Cancer Maternal Grandmother    Hypertension Father    Heart disease Father    Asthma Father    Hypertension Mother    Heart disease Mother    Breast cancer Paternal Grandmother 26   Colon cancer Paternal Grandmother    Colon cancer Paternal Uncle    Allergic rhinitis Neg Hx    Angioedema Neg Hx    Eczema Neg Hx    Immunodeficiency Neg Hx    Urticaria Neg Hx    Esophageal cancer Neg Hx    Rectal cancer Neg Hx    Stomach cancer Neg Hx     Social History   Socioeconomic History   Marital status: Married    Spouse name: Not on file   Number of children: 3   Years of education: Not on file   Highest education level: Not on file  Occupational History   Occupation: retired  Tobacco Use   Smoking status: Former    Current packs/day: 0.00    Types: Cigarettes    Start date: 04/15/1984    Quit date: 04/15/1989    Years since quitting: 34.7   Smokeless tobacco: Never  Vaping Use   Vaping status: Never Used  Substance and Sexual Activity   Alcohol use: Not Currently    Alcohol/week: 1.0 standard drink of alcohol    Types: 1 Glasses of wine per week    Comment: occasional wine   Drug use: No   Sexual activity: Not on file  Other Topics Concern   Not on file  Social History Narrative   She is married.  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   Occasional wine, former smoker not now no substances   3 kids plus two step daughters   Social Drivers of Corporate investment banker Strain: Low Risk  (11/25/2023)   Overall Financial Resource Strain (CARDIA)    Difficulty of Paying Living Expenses: Not hard at all  Food Insecurity: No Food Insecurity (11/25/2023)   Hunger Vital Sign    Worried About Running Out of Food in the Last Year: Never true    Ran Out of Food in the Last Year: Never true  Transportation Needs: No  Transportation Needs (11/25/2023)   PRAPARE - Administrator, Civil Service (Medical): No    Lack of Transportation (Non-Medical): No  Physical Activity: Insufficiently Active (11/25/2023)   Exercise Vital Sign    Days of Exercise per Week: 3 days    Minutes of Exercise per Session: 20 min  Stress: No Stress Concern Present (11/25/2023)   [employer] of Occupational Health - Occupational Stress Questionnaire    Feeling of Stress: Not at all  Social Connections: Socially Integrated (11/25/2023)   Social Connection and Isolation Panel    Frequency of Communication with Friends and Family: More than three times a week    Frequency of Social Gatherings with Friends and Family:  More than three times a week    Attends Religious Services: More than 4 times per year    Active Member of Clubs or Organizations: Yes    Attends Banker Meetings: More than 4 times per year    Marital Status: Married    Current Medications:  Current Outpatient Medications:    acetaminophen  (TYLENOL ) 500 MG tablet, Take 1,000 mg by mouth every 6 (six) hours as needed for moderate pain (pain score 4-6)., Disp: , Rfl:    albuterol  (VENTOLIN  HFA) 108 (90 Base) MCG/ACT inhaler, Inhale 2 puffs into the lungs every 6 (six) hours as needed for wheezing or shortness of breath., Disp: 18 g, Rfl: 1   alendronate  (FOSAMAX ) 70 MG tablet, TAKE 1 TABLET(70 MG) BY MOUTH EVERY 7 DAYS WITH A FULL GLASS OF WATER AND ON AN EMPTY STOMACH, Disp: 4 tablet, Rfl: 11   ALLERGY  RELIEF 180 MG tablet, TAKE 1 TABLET BY MOUTH EVERY DAY AS NEEDED FOR ALLERGIES MAY TAKE EXTRA DOSE DURING FLARE UPS, Disp: 180 tablet, Rfl: 1   Apoaequorin (PREVAGEN EXTRA STRENGTH) 20 MG CAPS, Take 20 mg by mouth daily., Disp: , Rfl:    atorvastatin  (LIPITOR) 10 MG tablet, TAKE 1 TABLET(10 MG) BY MOUTH DAILY, Disp: 90 tablet, Rfl: 3   dicyclomine  (BENTYL ) 10 MG capsule, Take 10 mg by mouth 2 (two) times daily as needed for spasms., Disp: ,  Rfl:    famotidine  (PEPCID ) 20 MG tablet, Take 1 tablet (20 mg total) by mouth 2 (two) times daily., Disp: 180 tablet, Rfl: 1   fluticasone  (FLONASE ) 50 MCG/ACT nasal spray, Place 2 sprays into both nostrils 2 (two) times daily., Disp: , Rfl:    fluticasone  (FLOVENT  HFA) 110 MCG/ACT inhaler, Flovent  2 puffs once daily in evening/bedtime with spacer., Disp: 1 each, Rfl: 5   lansoprazole  (PREVACID ) 15 MG capsule, Take 1 capsule (15 mg total) by mouth 2 (two) times daily before a meal., Disp: 180 capsule, Rfl: 1   losartan -hydrochlorothiazide  (HYZAAR ) 100-25 MG tablet, TAKE 1 TABLET BY MOUTH EVERY DAY, Disp: 90 tablet, Rfl: 1   montelukast  (SINGULAIR ) 10 MG tablet, Take 1 tablet (10 mg total) by mouth at bedtime., Disp: 90 tablet, Rfl: 3   triamcinolone  cream (KENALOG ) 0.1 %, Apply 1 Application topically 2 (two) times daily as needed (rash)., Disp: , Rfl:    senna-docusate (SENOKOT-S) 8.6-50 MG tablet, Take 2 tablets by mouth at bedtime. For AFTER surgery, do not take if having diarrhea (Patient not taking: Reported on 12/28/2023), Disp: 30 tablet, Rfl: 0  Review of Systems: + voice changes Denies appetite changes, fevers, chills, fatigue, unexplained weight changes. Denies hearing loss, neck lumps or masses, mouth sores, ringing in ears. Denies cough or wheezing.  Denies shortness of breath. Denies chest pain or palpitations. Denies leg swelling. Denies abdominal distention, pain, blood in stools, constipation, diarrhea, nausea, vomiting, or early satiety. Denies pain with intercourse, dysuria, frequency, hematuria or incontinence. Denies hot flashes, pelvic pain, vaginal bleeding or vaginal discharge.   Denies joint pain, back pain or muscle pain/cramps. Denies itching, rash, or wounds. Denies dizziness, headaches, numbness or seizures. Denies swollen lymph nodes or glands, denies easy bruising or bleeding. Denies anxiety, depression, confusion, or decreased concentration.  Physical  Exam: BP (!) 140/83 (BP Location: Left Arm, Patient Position: Sitting)   Pulse 67   Temp 98.1 F (36.7 C) (Oral)   Resp 20   Wt 194 lb 6.4 oz (88.2 kg)   SpO2 97%   BMI  33.60 kg/m  General: Alert, oriented, no acute distress. HEENT: Posterior oropharynx clear, sclera anicteric.  GU: External genitalia normal in appearance.  Incision has almost completely healed, several stitches still in place.  No surrounding erythema or induration.  Laboratory & Radiologic Studies: A. VULVA, LEFT, EXCISION:  - Paget's disease  - Margins negative for involvement   Assessment & Plan: DAHLIA NIFONG is a 73 y.o. woman with extramammary Paget's disease, status post recent resection.  Patient doing very well.  Reviewed continued expectations and restrictions.  She was given a copy of her pathology report which confirmed Paget's disease with negative margins.  Plan to see her for follow-up in 6 months.  She will call with any new symptoms.  16 minutes of total time was spent for this patient encounter, including preparation, face-to-face counseling with the patient and coordination of care, and documentation of the encounter.  Comer Dollar, MD  Division of Gynecologic Oncology  Department of Obstetrics and Gynecology  [facility]

## 2023-12-29 NOTE — Patient Instructions (Signed)
 It was great to see you today.  Your incision is almost completely healed.  We will see you back for follow-up in 6 months.  Please call if you have any new symptoms or need to be seen sooner.

## 2024-01-03 ENCOUNTER — Ambulatory Visit (INDEPENDENT_AMBULATORY_CARE_PROVIDER_SITE_OTHER): Admitting: Family Medicine

## 2024-01-03 ENCOUNTER — Other Ambulatory Visit: Payer: Self-pay

## 2024-01-03 ENCOUNTER — Encounter: Payer: Self-pay | Admitting: Family Medicine

## 2024-01-03 VITALS — BP 112/70 | HR 61 | Temp 97.7°F | Ht 63.5 in | Wt 195.0 lb

## 2024-01-03 DIAGNOSIS — M810 Age-related osteoporosis without current pathological fracture: Secondary | ICD-10-CM | POA: Diagnosis not present

## 2024-01-03 DIAGNOSIS — C519 Malignant neoplasm of vulva, unspecified: Secondary | ICD-10-CM

## 2024-01-03 DIAGNOSIS — Z0000 Encounter for general adult medical examination without abnormal findings: Secondary | ICD-10-CM | POA: Diagnosis not present

## 2024-01-03 DIAGNOSIS — E7800 Pure hypercholesterolemia, unspecified: Secondary | ICD-10-CM

## 2024-01-03 DIAGNOSIS — R0683 Snoring: Secondary | ICD-10-CM | POA: Insufficient documentation

## 2024-01-03 DIAGNOSIS — I10 Essential (primary) hypertension: Secondary | ICD-10-CM | POA: Diagnosis not present

## 2024-01-03 MED ORDER — BOOSTRIX 5-2.5-18.5 LF-MCG/0.5 IM SUSY
PREFILLED_SYRINGE | INTRAMUSCULAR | 0 refills | Status: AC
  Filled 2024-01-03: qty 0.5, 1d supply, fill #0

## 2024-01-03 MED ORDER — SHINGRIX 50 MCG/0.5ML IM SUSR
INTRAMUSCULAR | 0 refills | Status: AC
  Filled 2024-01-03: qty 1, 1d supply, fill #0

## 2024-01-03 NOTE — Assessment & Plan Note (Signed)
Chronic, followed by GYN ?

## 2024-01-03 NOTE — Assessment & Plan Note (Signed)
 Chronic, on year 2 of Fosamax .  Tolerating well.  Due for repeat bone density this year.

## 2024-01-03 NOTE — Assessment & Plan Note (Signed)
Stable, chronic.   Losartan/HCTZ 100/25 mg daily

## 2024-01-03 NOTE — Patient Instructions (Signed)
Please call the location of your choice from the menu below to schedule your Mammogram and/or Bone Density appointment.    [facility]   Phone:  [phone]   [address]                                                                            [place], [place] [address]                                            Services: 3D Mammogram and Bone Density  [facility] [place] ([facility])  Phone:  [phone]   [address]                        [place], [place] [address]                                              Services:  3D Mammogram and Bone Density  

## 2024-01-03 NOTE — Progress Notes (Signed)
 Patient ID: [name], female    DOB: 1951/02/20, 73 y.o.   MRN: [id number]  This visit was conducted in person.  BP 112/70   Pulse 61   Temp 97.7 F (36.5 C) (Temporal)   Ht 5' 3.5 (1.613 m)   Wt 195 lb (88.5 kg)   SpO2 96%   BMI 34.00 kg/m    CC:  Chief Complaint  Patient presents with   Annual Exam    Part 2 ([facility] 09/25/2023)   Snoring    Wants Sleep Study    Subjective:   HPI: [name] is a 73 y.o. female presenting on 01/03/2024 for Annual Exam (Part 2 ([facility] 09/25/2023)) and Snoring (Wants Sleep Study)  The patient presents for annual medicare wellness, complete physical and review of chronic health problems. He/[name] also has the following acute concerns today:   Snoring... concerned for sleep apnea  STOP BANG test 3/8  No fatigue, feels well rest.   Jaw pain on right.. worse at night and when chewing.  Occ clicking on left   The patient saw a LPN or RN for medicare wellness visit. 11/25/2023  Prevention and wellness was reviewed in detail. Note reviewed and important notes copied below.  Hypertension: Well-controlled on losartan  HCTZ 100./25 1 tablet daily. Has asthma and chronic cough.. could BBlocker be worsening this? BP Readings from Last 3 Encounters:  01/03/24 112/70  12/29/23 (!) 140/83  12/15/23 118/72  Using medication without problems or lightheadedness:  none Chest pain with exertion: none Edema: none Short of breath: none..Average home BPs: Other issues:  Elevated Cholesterol: Due for re-eval.. LDL at goal on atorvastatin  10 mg p.o. daily Lab Results  Component Value Date   CHOL 156 10/12/2022   HDL 68.10 10/12/2022   LDLCALC 70 10/12/2022   LDLDIRECT 122.5 07/14/2009   TRIG 89.0 10/12/2022   CHOLHDL 2 10/12/2022  Using medications without problems: none Muscle aches:  none Diet compliance  good Exercise: walking 2 miles several days a week The 10-year ASCVD risk score (Arnett DK, et al., 2019) is: 8.7%   Values used to  calculate the score:     Age: 44 years     Clincally relevant sex: Female     Is Non-Hispanic African American: No     Diabetic: No     Tobacco smoker: No     Systolic Blood Pressure: 112 mmHg     Is BP treated: Yes     HDL Cholesterol: 68.1 mg/dL     Total Cholesterol: 156 mg/dL Other complaints:   Paget's disease of vulva followed by gynecology.  Had vulvectomy 11/24/2023  Relevant past medical, surgical, family and social history reviewed and updated as indicated. Interim medical history since our last visit reviewed. Allergies and medications reviewed and updated. Outpatient Medications Prior to Visit  Medication Sig Dispense Refill   acetaminophen  (TYLENOL ) 500 MG tablet Take 1,000 mg by mouth every 6 (six) hours as needed for moderate pain (pain score 4-6).     albuterol  (VENTOLIN  HFA) 108 (90 Base) MCG/ACT inhaler Inhale 2 puffs into the lungs every 6 (six) hours as needed for wheezing or shortness of breath. 18 g 1   alendronate  (FOSAMAX ) 70 MG tablet TAKE 1 TABLET(70 MG) BY MOUTH EVERY 7 DAYS WITH A FULL GLASS OF WATER AND ON AN EMPTY STOMACH 4 tablet 11   ALLERGY  RELIEF 180 MG tablet TAKE 1 TABLET BY MOUTH EVERY DAY AS NEEDED FOR ALLERGIES MAY TAKE EXTRA DOSE DURING  FLARE UPS 180 tablet 1   Apoaequorin (PREVAGEN EXTRA STRENGTH) 20 MG CAPS Take 20 mg by mouth daily.     atorvastatin  (LIPITOR) 10 MG tablet TAKE 1 TABLET(10 MG) BY MOUTH DAILY 90 tablet 3   dicyclomine  (BENTYL ) 10 MG capsule Take 10 mg by mouth 2 (two) times daily as needed for spasms.     famotidine  (PEPCID ) 20 MG tablet Take 1 tablet (20 mg total) by mouth 2 (two) times daily. 180 tablet 1   fluticasone  (FLONASE ) 50 MCG/ACT nasal spray Place 2 sprays into both nostrils 2 (two) times daily.     fluticasone  (FLOVENT  HFA) 110 MCG/ACT inhaler Flovent  110mcg 2 puffs once daily in evening/bedtime with spacer. 1 each 5   lansoprazole  (PREVACID ) 15 MG capsule Take 1 capsule (15 mg total) by mouth 2 (two) times daily  before a meal. 180 capsule 1   losartan -hydrochlorothiazide  (HYZAAR ) 100-25 MG tablet TAKE 1 TABLET BY MOUTH EVERY DAY 90 tablet 1   montelukast  (SINGULAIR ) 10 MG tablet Take 1 tablet (10 mg total) by mouth at bedtime. 90 tablet 3   triamcinolone  cream (KENALOG ) 0.1 % Apply 1 Application topically 2 (two) times daily as needed (rash).     senna-docusate (SENOKOT-S) 8.6-50 MG tablet Take 2 tablets by mouth at bedtime. For AFTER surgery, do not take if having diarrhea (Patient not taking: Reported on 12/28/2023) 30 tablet 0   No facility-administered medications prior to visit.     Per HPI unless specifically indicated in ROS section below Review of Systems  Constitutional:  Negative for fatigue and fever.  HENT:  Negative for congestion.   Eyes:  Negative for pain.  Respiratory:  Negative for cough and shortness of breath.   Cardiovascular:  Negative for chest pain, palpitations and leg swelling.  Gastrointestinal:  Negative for abdominal pain.  Genitourinary:  Negative for dysuria and vaginal bleeding.  Musculoskeletal:  Negative for back pain.  Neurological:  Negative for syncope, light-headedness and headaches.  Psychiatric/Behavioral:  Negative for dysphoric mood.    Objective:  BP 112/70   Pulse 61   Temp 97.7 F (36.5 C) (Temporal)   Ht 5' 3.5 (1.613 m)   Wt 195 lb (88.5 kg)   SpO2 96%   BMI 34.00 kg/m   Wt Readings from Last 3 Encounters:  01/03/24 195 lb (88.5 kg)  12/29/23 194 lb 6.4 oz (88.2 kg)  12/15/23 197 lb 9.6 oz (89.6 kg)      Physical Exam Constitutional:      General: [name] is not in acute distress.    Appearance: Normal appearance. [name] is well-developed. [name] is not ill-appearing or toxic-appearing.  HENT:     Head: Normocephalic.     Right Ear: Hearing, tympanic membrane, ear canal and external ear normal. Tympanic membrane is not erythematous, retracted or bulging.     Left Ear: Hearing, tympanic membrane, ear canal and external ear normal. Tympanic  membrane is not erythematous, retracted or bulging.     Nose: No mucosal edema or rhinorrhea.     Right Sinus: No maxillary sinus tenderness or frontal sinus tenderness.     Left Sinus: No maxillary sinus tenderness or frontal sinus tenderness.     Mouth/Throat:     Pharynx: Uvula midline.  Eyes:     General: Lids are normal. Lids are everted, no foreign bodies appreciated.     Conjunctiva/sclera: Conjunctivae normal.     Pupils: Pupils are equal, round, and reactive to light.  Neck:  Thyroid : No thyroid  mass or thyromegaly.     Vascular: No carotid bruit.     Trachea: Trachea normal.  Cardiovascular:     Rate and Rhythm: Normal rate and regular rhythm.     Pulses: Normal pulses.     Heart sounds: Normal heart sounds, S1 normal and S2 normal. No murmur heard.    No friction rub. No gallop.  Pulmonary:     Effort: Pulmonary effort is normal. No tachypnea or respiratory distress.     Breath sounds: Normal breath sounds. No decreased breath sounds, wheezing, rhonchi or rales.  Abdominal:     General: Bowel sounds are normal.     Palpations: Abdomen is soft.     Tenderness: There is no abdominal tenderness.  Musculoskeletal:     Cervical back: Normal range of motion and neck supple.  Skin:    General: Skin is warm and dry.     Findings: No rash.  Neurological:     Mental Status: [name] is alert.  Psychiatric:        Mood and Affect: Mood is not anxious or depressed.        Speech: Speech normal.        Behavior: Behavior normal. Behavior is cooperative.        Thought Content: Thought content normal.        Judgment: Judgment normal.       Results for orders placed or performed during the hospital encounter of 11/24/23  Surgical pathology   Collection Time: 11/24/23  7:54 AM  Result Value Ref Range   SURGICAL PATHOLOGY      SURGICAL PATHOLOGY CASE: [phone] PATIENT: ARLAND KNEE Surgical Pathology Report     Clinical History: Paget's disease of vulva  (las)     FINAL MICROSCOPIC DIAGNOSIS:  A. VULVA, LEFT, EXCISION: - Pagetss disease - Margins negative for involvement  GROSS DESCRIPTION:  Specimen: Tan-pink to light brown skin ellipse without fat, clinically from left vulva, received fresh. Size: 4 cm from 12-6 o'clock, up to 2.5 cm from 3-9 o'clock and up to 0.25 cm thick. Lesion: There is a 1.1 x 0.2 x 0.1 cm fold of skin but no distinct lesions. Orientation: The specimen is inked green from 12-5 o'clock, orange from 6-11 o'clock, deep margin black. Block Summary: Blocks 1-4 = 8 cross-sections to include 3 o'clock, deep and 9 o'clock margins, sequentially submitted from 12-6 o'clock. Block 5 = radial sections of 12 and 6 o'clock poles  SW 11/25/2023    Final Diagnosis performed by Ilsa Pottier, MD.   Electronically signed 11/28/2023 Technical and / or Profession al components performed at [facility], [address], [place], [place] [address].  Immunohistochemistry Technical component (if applicable) was performed at [facility]. [address], [address], [place], [place] [address].   IMMUNOHISTOCHEMISTRY DISCLAIMER (if applicable): Some of these immunohistochemical stains may have been developed and the performance characteristics determine by [facility]. Some may not have been cleared or approved by the U.S. Food and Drug Administration. The FDA has determined that such clearance or approval is not necessary. This test is used for clinical purposes. It should not be regarded as investigational or for research. This laboratory is certified under the Clinical Laboratory Improvement Amendments of 1988 (CLIA-88) as qualified to perform high complexity clinical laboratory testing.  The controls stained appropriately.   IHC stains are performed on formalin fixed, paraffin embedded  tissue using a 3,3diaminobenzidine (DAB) chromogen and Leica Bond Autostainer  System.  The staining intensity of the nucleus is score manually and is reported as the percentage of tumor cell nuclei demonstrating specific nuclear staining. The specimens are fixed in 10% Neutral Formalin for at least 6 hours and up to 72hrs. These tests are validated on decalcified tissue. Results should be interpreted with caution given the possibility of false negative results on decalcified specimens. Antibody Clones are as follows ER-clone [id number], PR-clone 16, Ki67- clone MM1. Some of these immunohistochemical stains may have been developed and the performance characteristics determined by [facility].      COVID 19 screen:  No recent travel or known exposure to COVID19 The patient denies respiratory symptoms of COVID 19 at this time. The importance of social distancing was discussed today.   Assessment and Plan The patient's preventative maintenance and recommended screening tests for an annual wellness exam were reviewed in full today. Brought up to date unless services declined.  Counselled on the importance of diet, exercise, and its role in overall health and mortality. The patient's FH and SH was reviewed, including their home life, tobacco status, and drug and alcohol status.    Vaccines:  Given Tdap and shingles vaccine today,  flu, PNA 23.  Discussed COVID19 vaccine side effects and benefits. Strongly encouraged the patient to get the vaccine. Questions answered. PAP/DVE: Followed by GYN, partial hysterectomy.. Pap not indicated. Mammo:  02/2023 unremarkable. Colon: nml 2017 Dr. Avram, repeat in 10 years. Hep C : done   DEXA 02/2022   osteoporosis on fosamax  since 2023   Problem List Items Addressed This Visit     Essential hypertension, benign (Chronic)   Stable, chronic.   Losartan /HCTZ 100/25 mg daily       Hypercholesteremia (Chronic)   Stable, chronic.  Continue current medication.    atorvastatin  10 mg daily      Relevant Orders   Lipid Panel    Hepatic function panel   Osteoporosis   Chronic, on year 2 of Fosamax .  Tolerating well.  Due for repeat bone density this year.      Relevant Orders   DG Bone Density   Paget's disease of vulva (HCC) (Chronic)    Chronic, followed by GYN      Snoring   Chronic, no associated fatigue.  Not falling asleep in unusual locations. We discussed possible sleep testing and [name] has decided to hold off at this time.  [name] will let me know if [name] wants to move forward with referral to sleep specialist.      Other Visit Diagnoses       Routine general medical examination at a health care facility    -  Primary         Greig Ring, MD

## 2024-01-03 NOTE — Assessment & Plan Note (Signed)
 Chronic, no associated fatigue.  Not falling asleep in unusual locations. We discussed possible sleep testing and she has decided to hold off at this time.  She will let me know if she wants to move forward with referral to sleep specialist.

## 2024-01-03 NOTE — Assessment & Plan Note (Signed)
Stable, chronic.  Continue current medication. ° ° ° atorvastatin 10 mg daily °

## 2024-01-06 ENCOUNTER — Other Ambulatory Visit (INDEPENDENT_AMBULATORY_CARE_PROVIDER_SITE_OTHER)

## 2024-01-06 ENCOUNTER — Ambulatory Visit: Payer: Self-pay | Admitting: Family Medicine

## 2024-01-06 DIAGNOSIS — E7800 Pure hypercholesterolemia, unspecified: Secondary | ICD-10-CM

## 2024-01-06 LAB — HEPATIC FUNCTION PANEL
ALT: 21 U/L (ref 0–35)
AST: 18 U/L (ref 0–37)
Albumin: 4.2 g/dL (ref 3.5–5.2)
Alkaline Phosphatase: 44 U/L (ref 39–117)
Bilirubin, Direct: 0.1 mg/dL (ref 0.0–0.3)
Total Bilirubin: 0.4 mg/dL (ref 0.2–1.2)
Total Protein: 7.1 g/dL (ref 6.0–8.3)

## 2024-01-06 LAB — LIPID PANEL
Cholesterol: 166 mg/dL (ref 0–200)
HDL: 70 mg/dL (ref >39.00)
LDL Cholesterol: 82 mg/dL (ref 0–99)
NonHDL: 95.54
Total CHOL/HDL Ratio: 2
Triglycerides: 70 mg/dL (ref 0.0–149.0)
VLDL: 14 mg/dL (ref 0.0–40.0)

## 2024-01-13 ENCOUNTER — Other Ambulatory Visit: Payer: Self-pay

## 2024-01-19 ENCOUNTER — Other Ambulatory Visit: Payer: Self-pay | Admitting: Family Medicine

## 2024-01-19 DIAGNOSIS — H524 Presbyopia: Secondary | ICD-10-CM | POA: Diagnosis not present

## 2024-03-01 ENCOUNTER — Other Ambulatory Visit: Payer: Self-pay | Admitting: Family Medicine

## 2024-03-12 ENCOUNTER — Other Ambulatory Visit: Payer: Self-pay

## 2024-03-15 ENCOUNTER — Ambulatory Visit (INDEPENDENT_AMBULATORY_CARE_PROVIDER_SITE_OTHER)

## 2024-03-15 DIAGNOSIS — Z23 Encounter for immunization: Secondary | ICD-10-CM

## 2024-03-15 NOTE — Progress Notes (Signed)
 Per orders of Dr. Greig Ring, injection of second shingrix  IM given by Laray Arenas in left deltoid. Patient tolerated injection well.

## 2024-04-02 ENCOUNTER — Other Ambulatory Visit: Payer: Self-pay | Admitting: Family Medicine

## 2024-04-10 ENCOUNTER — Telehealth: Payer: Self-pay

## 2024-04-10 NOTE — Telephone Encounter (Signed)
 Copied from CRM #[id number]. Topic: General - Call Back - No Documentation >> Apr 10, 2024  1:58 PM Franky GRADE wrote: Reason for CRM: Patient is returning a call she received from the office, she could not make out the name on the voicemail; however, there are no documentation on the account.  Spoke with patient about a call that was made by Joellen to set up for vaccine clinic. Pt stated already gotten the prevnar, flu, and shingles. Not interested in RSV or Covid.

## 2024-04-18 ENCOUNTER — Ambulatory Visit
Admission: RE | Admit: 2024-04-18 | Discharge: 2024-04-18 | Disposition: A | Discharge status: Home / Self Care | Source: Ambulatory Visit | Attending: Internal Medicine | Admitting: Internal Medicine

## 2024-04-18 ENCOUNTER — Other Ambulatory Visit: Payer: Self-pay | Admitting: Family Medicine

## 2024-04-18 ENCOUNTER — Ambulatory Visit
Admission: RE | Admit: 2024-04-18 | Discharge: 2024-04-18 | Disposition: A | Discharge status: Home / Self Care | Source: Ambulatory Visit | Attending: Family Medicine | Admitting: Family Medicine

## 2024-04-18 DIAGNOSIS — M810 Age-related osteoporosis without current pathological fracture: Secondary | ICD-10-CM | POA: Insufficient documentation

## 2024-04-18 DIAGNOSIS — Z780 Asymptomatic menopausal state: Secondary | ICD-10-CM | POA: Diagnosis not present

## 2024-04-18 DIAGNOSIS — Z1231 Encounter for screening mammogram for malignant neoplasm of breast: Secondary | ICD-10-CM | POA: Insufficient documentation

## 2024-04-18 DIAGNOSIS — M8589 Other specified disorders of bone density and structure, multiple sites: Secondary | ICD-10-CM | POA: Diagnosis not present

## 2024-04-18 MED ORDER — ALENDRONATE SODIUM 70 MG PO TABS: 70.0000 mg | ORAL_TABLET | ORAL | 11 refills | Status: AC

## 2024-04-18 NOTE — Telephone Encounter (Signed)
 Copied from CRM [id number]. Topic: Clinical - Medication Refill >> Apr 18, 2024 11:52 AM Laymon HERO wrote: Medication: alendronate  (FOSAMAX ) 70 MG tablet  Has the patient contacted their pharmacy? Yes (Agent: If no, request that the patient contact the pharmacy for the refill. If patient does not wish to contact the pharmacy document the reason why and proceed with request.) (Agent: If yes, when and what did the pharmacy advise?)  This is the patient's preferred pharmacy:  [id number] - [place], [place] - [address] AT [facility] [address] [facility] [address] [address] [place] [address] Phone: [phone] Fax: [phone]   Is this the correct pharmacy for this prescription? Yes If no, delete pharmacy and type the correct one.   Has the prescription been filled recently? Yes  Is the patient out of the medication? Yes  Has the patient been seen for an appointment in the last year OR does the patient have an upcoming appointment? Yes  Can we respond through MyChart? Yes  Agent: Please be advised that Rx refills may take up to 3 business days. We ask that you follow-up with your pharmacy.

## 2024-04-20 ENCOUNTER — Ambulatory Visit: Payer: Self-pay | Admitting: General Practice

## 2024-05-18 ENCOUNTER — Other Ambulatory Visit: Payer: Self-pay | Admitting: Allergy

## 2024-05-23 ENCOUNTER — Other Ambulatory Visit: Payer: Self-pay | Admitting: Allergy

## 2024-06-04 ENCOUNTER — Ambulatory Visit

## 2024-06-04 DIAGNOSIS — L578 Other skin changes due to chronic exposure to nonionizing radiation: Secondary | ICD-10-CM | POA: Diagnosis not present

## 2024-06-04 DIAGNOSIS — W908XXA Exposure to other nonionizing radiation, initial encounter: Secondary | ICD-10-CM | POA: Diagnosis not present

## 2024-06-04 DIAGNOSIS — L820 Inflamed seborrheic keratosis: Secondary | ICD-10-CM

## 2024-06-04 DIAGNOSIS — Z85828 Personal history of other malignant neoplasm of skin: Secondary | ICD-10-CM

## 2024-06-04 DIAGNOSIS — L821 Other seborrheic keratosis: Secondary | ICD-10-CM

## 2024-06-04 DIAGNOSIS — L814 Other melanin hyperpigmentation: Secondary | ICD-10-CM

## 2024-06-04 DIAGNOSIS — L918 Other hypertrophic disorders of the skin: Secondary | ICD-10-CM | POA: Diagnosis not present

## 2024-06-04 DIAGNOSIS — D1801 Hemangioma of skin and subcutaneous tissue: Secondary | ICD-10-CM

## 2024-06-04 DIAGNOSIS — I781 Nevus, non-neoplastic: Secondary | ICD-10-CM

## 2024-06-04 DIAGNOSIS — Z1283 Encounter for screening for malignant neoplasm of skin: Secondary | ICD-10-CM | POA: Diagnosis not present

## 2024-06-04 DIAGNOSIS — D229 Melanocytic nevi, unspecified: Secondary | ICD-10-CM

## 2024-06-04 NOTE — Progress Notes (Signed)
 "   Subjective   [name] is a 73 y.o. female who presents for the following: Total body skin exam for skin cancer screening and mole check. The patient has spots, moles and lesions to be evaluated, some may be new or changing and the patient may have concern these could be cancer.. Patient is new patient  Today patient reports: Spot at face and moles she would like checked.  Spot at left forearm that she noticed  Hx of aks Patient has a family of skin cancer in maternal aunt at nose   Review of Systems:    No other skin or systemic complaints except as noted in HPI or Assessment and Plan.  The following portions of the chart were reviewed this encounter and updated as appropriate: medications, allergies, medical history  Relevant Medical History:  Family history of skin cancer - maternal aunt x 2    Objective  (SKPE) Well appearing patient in no apparent distress; mood and affect are within normal limits. Examination was performed of the: Full Skin Examination: scalp, head, eyes, ears, nose, lips, neck, chest, axillae, abdomen, back, buttocks, bilateral upper extremities, bilateral lower extremities, hands, feet, fingers, toes, fingernails, and toenails.   Examination notable for: SKIN EXAM, Angioma(s): Scattered red vascular papule(s)  , Lentigo/lentigines: Scattered pigmented macules that are tan to brown in color and are somewhat non-uniform in shape and concentrated in the sun-exposed areas, Nevus/nevi: Scattered well-demarcated, regular, pigmented macule(s) and/or papule(s)  , Seborrheic Keratosis(es): Stuck-on appearing keratotic papule(s) on the trunk, some  irritated with redness, crusting, edema, and/or partial avulsion, Actinic Damage/Elastosis: chronic sun damage: dyspigmentation, telangiectasia, and wrinkling, Actinic keratosis: Scaly erythematous macule(s) concentrated on sun exposed areas   Examination limited by: Undergarments   chest x 9, (9) Erythematous stuck-on,  waxy papule or plaque  Assessment & Plan  (SKAP)   SKIN CANCER SCREENING PERFORMED TODAY.  BENIGN SKIN FINDINGS  - Lentigines  - Seborrheic keratoses  - Hemangiomas   - Nevus/Multiple Benign Nevi  - Telangiectasias  - skin tags  - Reassurance provided regarding the benign appearance of lesions noted on exam today; no treatment is indicated in the absence of symptoms/changes. - Reinforced importance of photoprotective strategies including liberal and frequent sunscreen use of a broad-spectrum SPF 30 or greater, use of protective clothing, and sun avoidance for prevention of cutaneous malignancy and photoaging.  Counseled patient on the importance of regular self-skin monitoring as well as routine clinical skin examinations as scheduled.  - discussed cosmetic removal of skin tags   ACTINIC DAMAGE - Chronic condition, secondary to cumulative UV/sun exposure - Recommend daily broad spectrum sunscreen SPF 30+ to sun-exposed areas, reapply every 2 hours as needed.  - Staying in the shade or wearing long sleeves, sun glasses (UVA+UVB protection) and wide brim hats (4-inch brim around the entire circumference of the hat) are also recommended for sun protection.  - Call for new or changing lesions.  Personal history of non melanoma skin cancer  and actinic keratosis  - Reviewed medical history for full details  - Reviewed sun protective measures as above - Encouraged full body skin exams     Was sun protection counseling provided?: Yes     Procedures, orders, diagnosis for this visit:  INFLAMED SEBORRHEIC KERATOSIS (9) chest x 9, (9) Symptomatic, irritating, patient would like treated. - Destruction of lesion - chest x 9, (9) Complexity: simple   Destruction method: cryotherapy   Informed consent: discussed and consent obtained   Timeout:  patient name, date of birth, surgical site, and procedure verified Lesion destroyed using liquid nitrogen: Yes   Region frozen until ice ball  extended beyond lesion: Yes   Outcome: patient tolerated procedure well with no complications   Post-procedure details: wound care instructions given     Inflamed seborrheic keratosis -     Destruction of lesion    Return to clinic: Return in about 1 year (around 06/04/2025) for TBSE.  I, Eleanor Blush, CMA, am acting as scribe for Lauraine JAYSON Kanaris, MD.   Documentation: I have reviewed the above documentation for accuracy and completeness, and I agree with the above.  Lauraine JAYSON Kanaris, MD  "

## 2024-06-04 NOTE — Patient Instructions (Addendum)
 Seborrheic Keratosis  What causes seborrheic keratoses? Seborrheic keratoses are harmless, common skin growths that first appear during adult life.  As time goes by, more growths appear.  Some people may develop a large number of them.  Seborrheic keratoses appear on both covered and uncovered body parts.  They are not caused by sunlight.  The tendency to develop seborrheic keratoses can be inherited.  They vary in color from skin-colored to gray, brown, or even black.  They can be either smooth or have a rough, warty surface.   Seborrheic keratoses are superficial and look as if they were stuck on the skin.  Under the microscope this type of keratosis looks like layers upon layers of skin.  That is why at times the top layer may seem to fall off, but the rest of the growth remains and re-grows.    Treatment Seborrheic keratoses do not need to be treated, but can easily be removed in the office.  Seborrheic keratoses often cause symptoms when they rub on clothing or jewelry.  Lesions can be in the way of shaving.  If they become inflamed, they can cause itching, soreness, or burning.  Removal of a seborrheic keratosis can be accomplished by freezing, burning, or surgery. If any spot bleeds, scabs, or grows rapidly, please return to have it checked, as these can be an indication of a skin cancer.   Cryotherapy Aftercare  Wash gently with soap and water everyday.   Apply Vaseline and Band-Aid daily until healed.     Melanoma ABCDEs  Melanoma is the most dangerous type of skin cancer, and is the leading cause of death from skin disease.  You are more likely to develop melanoma if you: Have light-colored skin, light-colored eyes, or red or blond hair Spend a lot of time in the sun Tan regularly, either outdoors or in a tanning bed Have had blistering sunburns, especially during childhood Have a close family member who has had a melanoma Have atypical moles or large birthmarks  Early detection  of melanoma is key since treatment is typically straightforward and cure rates are extremely high if we catch it early.   The first sign of melanoma is often a change in a mole or a new dark spot.  The ABCDE system is a way of remembering the signs of melanoma.  A for asymmetry:  The two halves do not match. B for border:  The edges of the growth are irregular. C for color:  A mixture of colors are present instead of an even brown color. D for diameter:  Melanomas are usually (but not always) greater than 6mm - the size of a pencil eraser. E for evolution:  The spot keeps changing in size, shape, and color.  Please check your skin once per month between visits. You can use a small mirror in front and a large mirror behind you to keep an eye on the back side or your body.   If you see any new or changing lesions before your next follow-up, please call to schedule a visit.  Please continue daily skin protection including broad spectrum sunscreen SPF 30+ to sun-exposed areas, reapplying every 2 hours as needed when you're outdoors.   Staying in the shade or wearing long sleeves, sun glasses (UVA+UVB protection) and wide brim hats (4-inch brim around the entire circumference of the hat) are also recommended for sun protection.     Due to recent changes in healthcare laws, you may see results of your  pathology and/or laboratory studies on MyChart before the doctors have had a chance to review them. We understand that in some cases there may be results that are confusing or concerning to you. Please understand that not all results are received at the same time and often the doctors may need to interpret multiple results in order to provide you with the best plan of care or course of treatment. Therefore, we ask that you please give us  2 business days to thoroughly review all your results before contacting the office for clarification. Should we see a critical lab result, you will be contacted  sooner.   If You Need Anything After Your Visit  If you have any questions or concerns for your doctor, please call our main line at [phone] and press option 4 to reach your doctor's medical assistant. If no one answers, please leave a voicemail as directed and we will return your call as soon as possible. Messages left after 4 pm will be answered the following business day.   You may also send us  a message via MyChart. We typically respond to MyChart messages within 1-2 business days.  For prescription refills, please ask your pharmacy to contact our office. Our fax number is [phone].  If you have an urgent issue when the clinic is closed that cannot wait until the next business day, you can page your doctor at the number below.    Please note that while we do our best to be available for urgent issues outside of office hours, we are not available 24/7.   If you have an urgent issue and are unable to reach us , you may choose to seek medical care at your doctor's office, retail clinic, urgent care center, or emergency room.  If you have a medical emergency, please immediately call 911 or go to the emergency department.  Pager Numbers  - Dr. Hester: [phone]  - Dr. Jackquline: [phone]  - Dr. Claudene: [phone]   - Dr. Raymund: [phone]  In the event of inclement weather, please call our main line at [phone] for an update on the status of any delays or closures.  Dermatology Medication Tips: Please keep the boxes that topical medications come in in order to help keep track of the instructions about where and how to use these. Pharmacies typically print the medication instructions only on the boxes and not directly on the medication tubes.   If your medication is too expensive, please contact our office at [phone] option 4 or send us  a message through MyChart.   We are unable to tell what your co-pay for medications will be in advance as this is  different depending on your insurance coverage. However, we may be able to find a substitute medication at lower cost or fill out paperwork to get insurance to cover a needed medication.   If a prior authorization is required to get your medication covered by your insurance company, please allow us  1-2 business days to complete this process.  Drug prices often vary depending on where the prescription is filled and some pharmacies may offer cheaper prices.  The website www.goodrx.com contains coupons for medications through different pharmacies. The prices here do not account for what the cost may be with help from insurance (it may be cheaper with your insurance), but the website can give you the price if you did not use any insurance.  - You can print the associated coupon and take it with your prescription to the pharmacy.  -  You may also stop by our office during regular business hours and pick up a GoodRx coupon card.  - If you need your prescription sent electronically to a different pharmacy, notify our office through [facility] or by phone at [phone] option 4.     Si Usted Necesita Algo Despus de Su Visita  Tambin puede enviarnos un mensaje a travs de Clinical cytogeneticist. Por lo general respondemos a los mensajes de MyChart en el transcurso de 1 a 2 das hbiles.  Para renovar recetas, por favor pida a su farmacia que se ponga en contacto con nuestra oficina. Randi lakes de fax es [place] [phone].  Si tiene un asunto urgente cuando la clnica est cerrada y que no puede esperar hasta el siguiente da hbil, puede llamar/localizar a su doctor(a) al nmero que aparece a continuacin.   Por favor, tenga en cuenta que aunque hacemos todo lo posible para estar disponibles para asuntos urgentes fuera del horario de [place], no estamos disponibles las 24 horas del da, los 7 [address]  [address] de la [place].   Si tiene un problema urgente y no puede comunicarse con nosotros, puede optar por buscar  atencin mdica  en el consultorio de su doctor(a), en una clnica privada, en un centro de atencin urgente o en una sala de emergencias.  Si tiene Engineer, drilling, por favor llame inmediatamente al 911 o vaya a la sala de emergencias.  Nmeros de bper  - Dr. Hester: [phone]  - Dra. Jackquline: [phone]  - Dr. Claudene: [phone]  - Dra. Kitts: [phone]  En caso de inclemencias del [place], por favor llame a nuestra lnea principal al [phone] para una actualizacin sobre el estado de cualquier retraso o cierre.  Consejos para la medicacin en dermatologa: Por favor, guarde las cajas en las que vienen los medicamentos de uso tpico para ayudarle a seguir las instrucciones sobre dnde y cmo usarlos. Las farmacias generalmente imprimen las instrucciones del medicamento slo en las cajas y no directamente en los tubos del [place].   Si su medicamento es muy caro, por favor, pngase en contacto con landry rieger llamando al [phone] y presione la opcin 4 o envenos un mensaje a travs de Clinical cytogeneticist.   No podemos decirle cul ser su copago por los medicamentos por adelantado ya que esto es diferente dependiendo de la cobertura de su seguro. Sin embargo, es posible que podamos encontrar un medicamento sustituto a Audiological scientist un formulario para que el seguro cubra el medicamento que se considera necesario.   Si se requiere una autorizacin previa para que su compaa de seguros malta su medicamento, por favor permtanos de 1 a 2 das hbiles para completar este proceso.  Los precios de los medicamentos varan con frecuencia dependiendo del Environmental consultant de dnde se surte la receta y alguna farmacias pueden ofrecer precios ms baratos.  El sitio web www.goodrx.com tiene cupones para medicamentos de Health and safety inspector. Los precios aqu no tienen en cuenta lo que podra costar con la ayuda del seguro (puede ser ms barato con su seguro), pero el sitio web puede  darle el precio si no utiliz Tourist information centre manager.  - Puede imprimir el cupn correspondiente y llevarlo con su receta a la farmacia.  - Tambin puede pasar por nuestra oficina durante el horario de atencin regular y Education officer, museum una tarjeta de cupones de GoodRx.  - Si necesita que su receta se enve electrnicamente a Psychiatrist, informe a nuestra oficina a travs de MyChart de [employer] o por  telfono llamando al [phone] y presione la opcin 4.

## 2024-06-05 ENCOUNTER — Telehealth: Payer: Self-pay

## 2024-06-05 NOTE — Telephone Encounter (Signed)
 Patient's medical records received from [facility] and scanned into media tab. Please advise

## 2024-06-07 HISTORY — PX: MOLE REMOVAL: SHX2046

## 2024-06-12 ENCOUNTER — Other Ambulatory Visit: Payer: Self-pay | Admitting: Family Medicine

## 2024-06-12 ENCOUNTER — Other Ambulatory Visit: Payer: Self-pay | Admitting: Allergy

## 2024-06-20 ENCOUNTER — Other Ambulatory Visit: Payer: Self-pay

## 2024-06-20 ENCOUNTER — Ambulatory Visit: Admitting: Allergy

## 2024-06-20 ENCOUNTER — Encounter: Payer: Self-pay | Admitting: Allergy

## 2024-06-20 VITALS — BP 122/76 | HR 62 | Temp 98.1°F | Resp 19 | Ht 63.0 in | Wt 198.8 lb

## 2024-06-20 DIAGNOSIS — J4540 Moderate persistent asthma, uncomplicated: Secondary | ICD-10-CM

## 2024-06-20 DIAGNOSIS — J3089 Other allergic rhinitis: Secondary | ICD-10-CM | POA: Diagnosis not present

## 2024-06-20 DIAGNOSIS — J302 Other seasonal allergic rhinitis: Secondary | ICD-10-CM | POA: Diagnosis not present

## 2024-06-20 DIAGNOSIS — K2100 Gastro-esophageal reflux disease with esophagitis, without bleeding: Secondary | ICD-10-CM | POA: Diagnosis not present

## 2024-06-20 MED ORDER — FLUTICASONE PROPIONATE HFA 110 MCG/ACT IN AERO: INHALATION_SPRAY | RESPIRATORY_TRACT | 5 refills | Status: AC

## 2024-06-20 MED ORDER — MONTELUKAST SODIUM 10 MG PO TABS: ORAL_TABLET | ORAL | 3 refills | Status: AC

## 2024-06-20 MED ORDER — ALBUTEROL SULFATE HFA 108 (90 BASE) MCG/ACT IN AERS: 2.0000 | INHALATION_SPRAY | RESPIRATORY_TRACT | 1 refills | Status: AC | PRN

## 2024-06-20 MED ORDER — LANSOPRAZOLE 15 MG PO CPDR: 15.0000 mg | DELAYED_RELEASE_CAPSULE | Freq: Two times a day (BID) | ORAL | 1 refills | Status: AC

## 2024-06-20 MED ORDER — IPRATROPIUM BROMIDE 0.06 % NA SOLN: NASAL | 3 refills | Status: AC

## 2024-06-20 MED ORDER — FAMOTIDINE 20 MG PO TABS: ORAL_TABLET | ORAL | 1 refills | Status: AC

## 2024-06-20 NOTE — Patient Instructions (Addendum)
 Asthma - lung function testing looks great today! - Daily controller medication(s):  Flovent  110mcg 2 puffs once daily in evening/bedtime with spacer.   Montelukast  10 mg daily at bedtime.   - Prior to physical activity: albuterol  2 puffs 10-15 minutes before physical activity. - Rescue medications: albuterol  4 puffs every 4-6 hours as needed - Changes during respiratory infections or worsening symptoms: Increase Flovent  to 2 puffs twice daily for TWO WEEKS. - Asthma control goals:  * Full participation in all desired activities (may need albuterol  before activity) * Albuterol  use two time or less a week on average (not counting use with activity) * Cough interfering with sleep two time or less a month * Oral steroids no more than once a year * No hospitalizations  Allergic rhinitis Continue allergen avoidance measures directed toward pollens, cat, and mold  Continue Allegra  180 mg 1 tab 1-2 times a day for allergy symptom control Use Ipratropium bromide  (Atrovent ) nasal spray using 1-2 sprays in each nostril 3-4 times a day as needed for drainage down throat/runny nose or congestion. Caution as this can be drying Use Fluticasone  (flonase ) 2 sprays each nostril daily for 1-2 weeks at a time before stopping once nasal congestion improves for maximum benefit.  Consider saline nasal rinses as needed for nasal symptoms. Use this before any medicated nasal sprays for best result  Reflux Continue dietary and lifestyle modifications as listed below Continue lansoprazole  15 mg twice a day and famotidine  20 mg twice a day  Follow up in 6 months or sooner if needed.

## 2024-06-20 NOTE — Progress Notes (Signed)
 "   Follow-up Note  RE: [name] MRN: [id number] DOB: Jun 16, 1950 Date of Office Visit: 06/20/2024   History of present illness: [name] is a 74 y.o. female presenting today for follow-up of asthma, allergic rhinitis.  She was last seen in the office on 12/15/2023 by myself.  Discussed the use of AI scribe software for clinical note transcription with the patient, who gave verbal consent to proceed.  In November, she experienced a viral illness with a cough and low-grade fever. The cough has persisted as a post-viral symptom. During the illness, she tested negative for influenza.  She does report or increasing her Flovent  during this timeframe. She otherwise uses Flovent  2 puffs once a day.  She states her spacer broke thus she needs a new spacer.  She also continues to take Singulair  daily. She states her grandchildren currently have the flu but she has not been able to see them due to this. She has not required urgent care or emergency department visits for her symptoms and has not used antibiotics. She receives yearly flu vaccinations and recalls a severe flu episode in the past that motivated her to continue with vaccinations.  She has a history of allergies and mentioned she thought her allergies might be contributing to continued cough symptoms.  She also is noticing a lot of throat clearing lately. She has not been using ipratropium nasal spray regularly due to concerns about it leading to hoarseness. She also uses fluticasone  nasal spray but states when she uses it is typically before bedtime and then she lays down and it this then drains into her throat and she can taste it and it is unpleasant.  She thinks moving it up earlier in the day may help decrease that.      Review of systems: 10pt is negative unless noted above in HPI  Past medical/social/surgical/family history have been reviewed and are unchanged unless specifically indicated below.  No changes  Medication  List: Current Outpatient Medications  Medication Sig Dispense Refill   acetaminophen  (TYLENOL ) 500 MG tablet Take 1,000 mg by mouth every 6 (six) hours as needed for moderate pain (pain score 4-6).     albuterol  (VENTOLIN  HFA) 108 (90 Base) MCG/ACT inhaler Inhale 2 puffs into the lungs every 6 (six) hours as needed for wheezing or shortness of breath. 18 g 1   alendronate  (FOSAMAX ) 70 MG tablet Take 1 tablet (70 mg total) by mouth once a week. Take with a full glass of water on an empty stomach. 4 tablet 11   ALLERGY RELIEF 180 MG tablet TAKE 1 TABLET BY MOUTH EVERY DAY AS NEEDED FOR ALLERGIES MAY TAKE EXTRA DOSE DURING FLARE UPS 180 tablet 1   Apoaequorin (PREVAGEN EXTRA STRENGTH) 20 MG CAPS Take 20 mg by mouth daily.     atorvastatin  (LIPITOR) 10 MG tablet TAKE 1 TABLET(10 MG) BY MOUTH DAILY 90 tablet 3   dicyclomine  (BENTYL ) 10 MG capsule Take 10 mg by mouth 2 (two) times daily as needed for spasms.     famotidine  (PEPCID ) 20 MG tablet TAKE 1 TABLET(20 MG) BY MOUTH TWICE DAILY 180 tablet 1   fluticasone  (FLONASE ) 50 MCG/ACT nasal spray Place 2 sprays into both nostrils 2 (two) times daily.     fluticasone  (FLOVENT  HFA) 110 MCG/ACT inhaler Flovent  2 puffs once daily in evening/bedtime with spacer. 1 each 5   lansoprazole  (PREVACID ) 15 MG capsule Take 1 capsule (15 mg total) by mouth 2 (two) times daily before  a meal. 180 capsule 1   losartan -hydrochlorothiazide  (HYZAAR) 100-25 MG tablet TAKE 1 TABLET BY MOUTH EVERY DAY 90 tablet 1   montelukast  (SINGULAIR ) 10 MG tablet TAKE 1 TABLET(10 MG) BY MOUTH AT BEDTIME 90 tablet 3   Tdap (BOOSTRIX ) 5-2.5-18.5 LF-MCG/0.5 injection Inject into the muscle. 0.5 mL 0   triamcinolone  cream (KENALOG ) 0.1 % Apply 1 Application topically 2 (two) times daily as needed (rash).     Zoster Vaccine Adjuvanted (SHINGRIX ) injection Inject into the muscle. 1 mL 0   No current facility-administered medications for this visit.     Known medication  allergies: Allergies[1]   Physical examination: Blood pressure 122/76, pulse 62, temperature 98.1 F (36.7 C), resp. rate 19, height 5' 3 (1.6 m), weight 198 lb 12.8 oz (90.2 kg), SpO2 94%.  General: Alert, interactive, in no acute distress. HEENT: PERRLA, TMs pearly gray, turbinates non-edematous with clear discharge, post-pharynx non erythematous. Neck: Supple without lymphadenopathy. Lungs: Clear to auscultation without wheezing, rhonchi or rales. {no increased work of breathing. CV: Normal S1, S2 without murmurs. Abdomen: Nondistended, nontender. Skin: Warm and dry, without lesions or rashes. Extremities:  No clubbing, cyanosis or edema. Neuro:   Grossly intact.  Diagnostics/Labs:  Spirometry: FEV1: 2.1L 100%, FVC: 2.71 L 100%, ratio consistent with nonobstructive pattern  Assessment and plan: Asthma - lung function testing looks great today! - Daily controller medication(s):  Flovent  110mcg 2 puffs once daily in evening/bedtime with spacer.   Montelukast  10 mg daily at bedtime.   - Prior to physical activity: albuterol  2 puffs 10-15 minutes before physical activity. - Rescue medications: albuterol  4 puffs every 4-6 hours as needed - Changes during respiratory infections or worsening symptoms: Increase Flovent  to 2 puffs twice daily for TWO WEEKS. - Asthma control goals:  * Full participation in all desired activities (may need albuterol  before activity) * Albuterol  use two time or less a week on average (not counting use with activity) * Cough interfering with sleep two time or less a month * Oral steroids no more than once a year * No hospitalizations  Allergic rhinitis Continue allergen avoidance measures directed toward pollens, cat, and mold  Continue Allegra  180 mg 1 tab 1-2 times a day for allergy symptom control Use Ipratropium bromide  (Atrovent ) nasal spray using 1-2 sprays in each nostril 3-4 times a day as needed for drainage down throat/runny nose or  congestion. Caution as this can be drying Use Fluticasone  (flonase ) 2 sprays each nostril daily for 1-2 weeks at a time before stopping once nasal congestion improves for maximum benefit.  Consider saline nasal rinses as needed for nasal symptoms. Use this before any medicated nasal sprays for best result  Reflux Continue dietary and lifestyle modifications as listed below Continue lansoprazole  15 mg twice a day and famotidine  20 mg twice a day  Follow up in 6 months or sooner if needed.  I appreciate the opportunity to take part in Mija's care. Please do not hesitate to contact me with questions.  Sincerely,   Danita Brain, MD Allergy/Immunology [facility]      [1]  Allergies Allergen Reactions   Ibuprofen Hypertension   Naprosyn [Naproxen] Nausea Only   Other     Allergic to toothpaste- makes mouth peel Hay fever/dust mites/trees 365 allergic    Sevoflurane Nausea And Vomiting   Azithromycin Itching and Rash   Sulfa Antibiotics Hives and Rash   "

## 2024-06-20 NOTE — Addendum Note (Signed)
 Addended by: MARCINE ISAIAH CROME on: 06/20/2024 04:53 PM   Modules accepted: Orders

## 2024-06-26 ENCOUNTER — Encounter: Payer: Self-pay | Admitting: Gynecologic Oncology

## 2024-06-29 ENCOUNTER — Inpatient Hospital Stay: Attending: Gynecologic Oncology | Admitting: Gynecologic Oncology

## 2024-06-29 ENCOUNTER — Encounter: Payer: Self-pay | Admitting: Gynecologic Oncology

## 2024-06-29 VITALS — BP 139/85 | HR 67 | Temp 98.3°F | Resp 19 | Wt 197.8 lb

## 2024-06-29 DIAGNOSIS — Z9079 Acquired absence of other genital organ(s): Secondary | ICD-10-CM | POA: Diagnosis not present

## 2024-06-29 DIAGNOSIS — Z8544 Personal history of malignant neoplasm of other female genital organs: Secondary | ICD-10-CM | POA: Insufficient documentation

## 2024-06-29 DIAGNOSIS — C519 Malignant neoplasm of vulva, unspecified: Secondary | ICD-10-CM

## 2024-06-29 NOTE — Progress Notes (Signed)
 Gynecologic Oncology Return Clinic Visit  06/29/24  Reason for Visit: follow-up   Treatment History: The patient previously had a supracervical hysterectomy but has her ovaries and cervix in situ. Surgery for Paget's disease of the vulva in October 2011.  There was focal involvement of the medial margin.  The entire lesion is approximately 5 cm in diameter.  She then underwent wide local excision of a recurrent Paget's lesion in August 2016.  She had surgical margins that were focally positive and subsequently underwent another excision on 04/10/2015 with negative margins. She had a vulvar biopsy from the right in November 2017 that was negative.   She was seen for a visible lesion and vulvar symptoms in April 2020 and a biopsy of this lesion was consistent with Paget's disease. On 11/07/2018, she underwent partial left vulvectomy with findings of a 1 cm white hyperkeratotic lesion on the left labia minora close to the clitoris.  Margins were positive.  Plan was for consideration of Aldara  since repeat excision could involve removal of the clitoris. Patient was seen on 01/02/2019 with a slightly erythematous area on the right vulva.  Plan was for repeat examination in September with biopsy if persistence of the lesion. On 9/20, patient underwent right vulvar biopsy which revealed extramammary Paget's disease.  After discussion of treatment options, the patient opted for Aldara  use. She was seen in early December, at which time her right vulvar lesion had completely resolved.  At this visit, a small right periclitoral lesion was noted. Given her excellent response to the previous lesion, her decision was to proceed with Aldara  use for this new lesion. Saw me on 07/23/19 with a right peri-clitoral lesion suspected to be side effect from Aldara  cream use. Seen on 02/29/20 with no evidence of disease on exam after 3 months not using Aldara .  Seen in 08/2020, asymptomatic, off treatment. Seen on 02/13/2021:  asymptomatic with no concerning exam findings. Seen in 11/2022: new vulvar lesions, biopsy showed Paget's disease. Started treatment with Aldara  in two locations.   11/24/23: Partial simple left vulvectomy.  Paget's disease, margins negative.  Interval History: Doing well.  Still having intermittent itching at the left upper vulva lateral to the clitoris, unchanged since prior to her recent surgery last summer.  Denies other symptoms including irritation, pain, bleeding, or discharge.  Saw her dermatologist recently.  Past Medical/Surgical History: Past Medical History:  Diagnosis Date   Allergic rhinitis    Arthritis    Asthma    mild no inaler use   GERD (gastroesophageal reflux disease)    Headache(784.0)    Migraines   History of adenomatous polyp of colon    History of concussion    AS CHILD--  NO RESIDUAL   History of palpitations    Hypertension    Paget's disease of vulva (HCC)    PONV (postoperative nausea and vomiting)    SEVERE   Wears glasses     Past Surgical History:  Procedure Laterality Date   MOLE REMOVAL  06/2024   mucoid cyst removal thumb     PULLERY RELEASE LEFT THUMB, LEFT RING FINGER/  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  02/17/2011   PULLEY RELEASE RIGHT THUMB  03/23/2011   SIMPLE VULVECTOMY  03/24/2010   right side   TONSILLECTOMY  1977   TRIGGER FINGER RELEASE     VAGINAL HYSTERECTOMY  1989   and Anterior and posterior repair's for prolapse partial   VULVECTOMY Right 01/10/2015   Procedure:  RIGHT WIDE LOCAL EXCISION VULVECTOMY;  Surgeon: Toribio Percy, MD;  Location: [facility];  Service: Gynecology;  Laterality: Right;   VULVECTOMY Right 04/10/2015   Procedure: WIDE LOCAL EXCISION VULVA;  Surgeon: Toribio Percy, MD;  Location: [facility];  Service: Gynecology;  Laterality: Right;   VULVECTOMY N/A 11/24/2023   Procedure: WIDE EXCISION VULVECTOMY;  Surgeon: Viktoria Comer SAUNDERS, MD;  Location: [facility];  Service: Gynecology;  Laterality: N/A;   VULVECTOMY PARTIAL N/A 11/07/2018   Procedure: VULVECTOMY PARTIAL;  Surgeon: Dodie Shadow, MD;  Location: [facility];  Service: Gynecology;  Laterality: N/A;   WISDOM TOOTH EXTRACTION      Family History  Problem Relation Age of Onset   Cancer Maternal Grandmother    Hypertension Father    Heart disease Father    Asthma Father    Hypertension Mother    Heart disease Mother    Breast cancer Paternal Grandmother 41   Colon cancer Paternal Grandmother    Colon cancer Paternal Uncle    Allergic rhinitis Neg Hx    Angioedema Neg Hx    Eczema Neg Hx    Immunodeficiency Neg Hx    Urticaria Neg Hx    Esophageal cancer Neg Hx    Rectal cancer Neg Hx    Stomach cancer Neg Hx     Social History   Socioeconomic History   Marital status: Married    Spouse name: Not on file   Number of children: 3   Years of education: Not on file   Highest education level: Not on file  Occupational History   Occupation: retired  Tobacco Use   Smoking status: Former    Current packs/day: 0.00    Types: Cigarettes    Start date: 04/15/1984    Quit date: 04/15/1989    Years since quitting: 35.2   Smokeless tobacco: Never  Vaping Use   Vaping status: Never Used  Substance and Sexual Activity   Alcohol use: Not Currently    Alcohol/week: 1.0 standard drink of alcohol    Types: 1 Glasses of wine per week    Comment: occasional wine   Drug use: No   Sexual activity: Not on file  Other Topics Concern   Not on file  Social History Narrative   She is married.  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   Occasional wine, former smoker not now no substances   3 kids plus two step daughters   Social Drivers of Health   Tobacco Use: Medium Risk (06/29/2024)   Patient History    Smoking Tobacco Use: Former    Smokeless Tobacco Use: Never    Passive Exposure: Not on file  Financial Resource Strain: Low Risk  (11/25/2023)   Overall Financial Resource Strain (CARDIA)    Difficulty of Paying Living Expenses: Not hard at all  Food Insecurity: No Food Insecurity (11/25/2023)   Epic    Worried About Radiation Protection Practitioner of Food in the Last Year: Never true    Ran Out of Food in the Last Year: Never true  Transportation Needs: No Transportation Needs (11/25/2023)   Epic    Lack of Transportation (Medical): No    Lack of Transportation (Non-Medical): No  Physical Activity: Insufficiently Active (11/25/2023)   Exercise Vital Sign    Days of Exercise per Week: 3 days    Minutes of Exercise per Session: 20 min  Stress: No Stress Concern Present (11/25/2023)   [employer] of Occupational Health -  Occupational Stress Questionnaire    Feeling of Stress: Not at all  Social Connections: Socially Integrated (11/25/2023)   Social Connection and Isolation Panel    Frequency of Communication with Friends and Family: More than three times a week    Frequency of Social Gatherings with Friends and Family: More than three times a week    Attends Religious Services: More than 4 times per year    Active Member of Clubs or Organizations: Yes    Attends Banker Meetings: More than 4 times per year    Marital Status: Married  Depression (PHQ2-9): Low Risk (01/03/2024)   Depression (PHQ2-9)    PHQ-2 Score: 0  Alcohol Screen: Low Risk (11/25/2023)   Alcohol Screen    Last Alcohol Screening Score (AUDIT): 0  Housing: Unknown (11/25/2023)   Epic    Unable to Pay for Housing in the Last Year: No    Number of Times Moved in the Last Year: Not on file    Homeless in the Last Year: No  Utilities: Not At Risk (11/25/2023)   Epic    Threatened with loss of utilities: No  Health Literacy: Adequate Health Literacy (11/25/2023)   B1300 Health Literacy    Frequency of need for help with medical instructions: Never    Current Medications: Current Medications[1]  Review of Systems: + Joint pain, back pain, muscle  cramp, itching Denies appetite changes, fevers, chills, fatigue, unexplained weight changes. Denies hearing loss, neck lumps or masses, mouth sores, ringing in ears or voice changes. Denies cough or wheezing.  Denies shortness of breath. Denies chest pain or palpitations. Denies leg swelling. Denies abdominal distention, pain, blood in stools, constipation, diarrhea, nausea, vomiting, or early satiety. Denies pain with intercourse, dysuria, frequency, hematuria or incontinence. Denies hot flashes, pelvic pain, vaginal bleeding or vaginal discharge.   Denies rash, or wounds. Denies dizziness, headaches, numbness or seizures. Denies swollen lymph nodes or glands, denies easy bruising or bleeding. Denies anxiety, depression, confusion, or decreased concentration.  Physical Exam: BP 139/85 (BP Location: Left Arm, Patient Position: Sitting)   Pulse 67   Temp 98.3 F (36.8 C) (Oral)   Resp 19   Wt 197 lb 12.8 oz (89.7 kg)   SpO2 97%   BMI 35.04 kg/m  General: Alert, oriented, no acute distress. HEENT: Posterior oropharynx clear, sclera anicteric. Chest: Clear to auscultation bilaterally.  No wheezes or rhonchi. Cardiovascular: Regular rate and rhythm, no murmurs. Abdomen: soft, nontender.  Normoactive bowel sounds.  No masses or hepatosplenomegaly appreciated.   Extremities: Grossly normal range of motion.  Warm, well perfused.  No edema bilaterally. Lymphatics: No cervical, supraclavicular, or inguinal adenopathy. GU: External female genitalia with changes related to prior surgery.  Labia minora surgically absent.  No areas with eczematoid, scaly or erythematous appearance.  Laboratory & Radiologic Studies: None new  Assessment & Plan: [name] is a 74 y.o. woman with extramammary Paget's disease, last excision in 11/2023 for recurrent Paget's (negative margins).   Patient overall doing well.  No visible findings of extramammary Paget's disease on her vulva.  Has some  intermittent itching.  Discussed option of spot treatment in this area with Aldara , although would be somewhat difficult to direct application of the medicine given no visible lesion.  Ultimately, the patient would like to continue with close follow-up and will let me know if she has increased or more bothersome itching or other symptoms.  She will call if she does to come in for an  exam, possible biopsy.  Plan for follow-up visit in 6 months for surveillance.  20 minutes of total time was spent for this patient encounter, including preparation, face-to-face counseling with the patient and coordination of care, and documentation of the encounter.  Comer Dollar, MD  Division of Gynecologic Oncology  Department of Obstetrics and Gynecology  [facility]      [1]  Current Outpatient Medications:    acetaminophen  (TYLENOL ) 500 MG tablet, Take 1,000 mg by mouth every 6 (six) hours as needed for moderate pain (pain score 4-6)., Disp: , Rfl:    albuterol  (VENTOLIN  HFA) 108 (90 Base) MCG/ACT inhaler, Inhale 2 puffs into the lungs every 4 (four) hours as needed for wheezing or shortness of breath., Disp: 18 g, Rfl: 1   alendronate  (FOSAMAX ) 70 MG tablet, Take 1 tablet (70 mg total) by mouth once a week. Take with a full glass of water on an empty stomach., Disp: 4 tablet, Rfl: 11   ALLERGY RELIEF 180 MG tablet, TAKE 1 TABLET BY MOUTH EVERY DAY AS NEEDED FOR ALLERGIES MAY TAKE EXTRA DOSE DURING FLARE UPS, Disp: 180 tablet, Rfl: 1   Apoaequorin (PREVAGEN EXTRA STRENGTH) 20 MG CAPS, Take 20 mg by mouth daily., Disp: , Rfl:    atorvastatin  (LIPITOR) 10 MG tablet, TAKE 1 TABLET(10 MG) BY MOUTH DAILY, Disp: 90 tablet, Rfl: 3   dicyclomine  (BENTYL ) 10 MG capsule, Take 10 mg by mouth 2 (two) times daily as needed for spasms., Disp: , Rfl:    famotidine  (PEPCID ) 20 MG tablet, TAKE 1 TABLET(20 MG) BY MOUTH TWICE DAILY, Disp: 180 tablet, Rfl: 1   fluticasone  (FLONASE ) 50 MCG/ACT  nasal spray, Place 2 sprays into both nostrils 2 (two) times daily., Disp: , Rfl:    fluticasone  (FLOVENT  HFA) 110 MCG/ACT inhaler, Flovent  2 puffs once daily in evening/bedtime with spacer., Disp: 1 each, Rfl: 5   ipratropium (ATROVENT ) 0.06 % nasal spray, 1-2 sprays in each nostril 3-4 times a day as needed for drainage down throat/runny nose or congestion. Caution as this can be drying, Disp: 15 mL, Rfl: 3   lansoprazole  (PREVACID ) 15 MG capsule, Take 1 capsule (15 mg total) by mouth 2 (two) times daily before a meal., Disp: 180 capsule, Rfl: 1   losartan -hydrochlorothiazide  (HYZAAR) 100-25 MG tablet, TAKE 1 TABLET BY MOUTH EVERY DAY, Disp: 90 tablet, Rfl: 1   montelukast  (SINGULAIR ) 10 MG tablet, TAKE 1 TABLET(10 MG) BY MOUTH AT BEDTIME, Disp: 90 tablet, Rfl: 3   Tdap (BOOSTRIX ) 5-2.5-18.5 LF-MCG/0.5 injection, Inject into the muscle., Disp: 0.5 mL, Rfl: 0   triamcinolone  cream (KENALOG ) 0.1 %, Apply 1 Application topically 2 (two) times daily as needed (rash)., Disp: , Rfl:    Zoster Vaccine Adjuvanted (SHINGRIX ) injection, Inject into the muscle., Disp: 1 mL, Rfl: 0

## 2024-06-29 NOTE — Patient Instructions (Signed)
 It was good to see you today.    I will see you for follow-up in 6 months.  If something changes in terms of your itching or other symptoms, please call so we can get you in sooner for an exam and possible biopsy.  As always, if you develop any new and concerning symptoms before your next visit, please call to see me sooner.

## 2024-07-10 ENCOUNTER — Ambulatory Visit

## 2024-07-10 DIAGNOSIS — L820 Inflamed seborrheic keratosis: Secondary | ICD-10-CM | POA: Diagnosis not present

## 2024-07-10 NOTE — Patient Instructions (Addendum)

## 2024-11-28 ENCOUNTER — Ambulatory Visit

## 2024-12-20 ENCOUNTER — Ambulatory Visit: Admitting: Allergy

## 2024-12-21 ENCOUNTER — Inpatient Hospital Stay: Admitting: Gynecologic Oncology

## 2025-06-05 ENCOUNTER — Encounter
